# Patient Record
Sex: Male | Born: 1935
Health system: Southern US, Community
[De-identification: ages and names within clinical notes are randomized; demographics above are authoritative.]

## PROBLEM LIST (undated history)

## (undated) DIAGNOSIS — F329 Major depressive disorder, single episode, unspecified: Secondary | ICD-10-CM

## (undated) DIAGNOSIS — Z8546 Personal history of malignant neoplasm of prostate: Secondary | ICD-10-CM

## (undated) DIAGNOSIS — Z9861 Coronary angioplasty status: Principal | ICD-10-CM

## (undated) DIAGNOSIS — F32A Depression, unspecified: Secondary | ICD-10-CM

## (undated) DIAGNOSIS — T4145XA Adverse effect of unspecified anesthetic, initial encounter: Secondary | ICD-10-CM

## (undated) DIAGNOSIS — M1990 Unspecified osteoarthritis, unspecified site: Secondary | ICD-10-CM

## (undated) DIAGNOSIS — T8859XA Other complications of anesthesia, initial encounter: Secondary | ICD-10-CM

## (undated) DIAGNOSIS — E118 Type 2 diabetes mellitus with unspecified complications: Secondary | ICD-10-CM

## (undated) DIAGNOSIS — Z85820 Personal history of malignant melanoma of skin: Secondary | ICD-10-CM

## (undated) DIAGNOSIS — I480 Paroxysmal atrial fibrillation: Principal | ICD-10-CM

## (undated) DIAGNOSIS — N433 Hydrocele, unspecified: Secondary | ICD-10-CM

## (undated) DIAGNOSIS — R3915 Urgency of urination: Secondary | ICD-10-CM

## (undated) DIAGNOSIS — C61 Malignant neoplasm of prostate: Secondary | ICD-10-CM

## (undated) DIAGNOSIS — E785 Hyperlipidemia, unspecified: Secondary | ICD-10-CM

## (undated) DIAGNOSIS — R413 Other amnesia: Secondary | ICD-10-CM

## (undated) DIAGNOSIS — G4733 Obstructive sleep apnea (adult) (pediatric): Secondary | ICD-10-CM

## (undated) DIAGNOSIS — B029 Zoster without complications: Secondary | ICD-10-CM

## (undated) DIAGNOSIS — IMO0001 Reserved for inherently not codable concepts without codable children: Secondary | ICD-10-CM

## (undated) DIAGNOSIS — I5189 Other ill-defined heart diseases: Secondary | ICD-10-CM

## (undated) DIAGNOSIS — Z7901 Long term (current) use of anticoagulants: Secondary | ICD-10-CM

## (undated) DIAGNOSIS — C439 Malignant melanoma of skin, unspecified: Secondary | ICD-10-CM

## (undated) DIAGNOSIS — I4891 Unspecified atrial fibrillation: Secondary | ICD-10-CM

## (undated) DIAGNOSIS — Z9989 Dependence on other enabling machines and devices: Secondary | ICD-10-CM

## (undated) DIAGNOSIS — I499 Cardiac arrhythmia, unspecified: Secondary | ICD-10-CM

## (undated) DIAGNOSIS — R55 Syncope and collapse: Secondary | ICD-10-CM

## (undated) DIAGNOSIS — Z9889 Other specified postprocedural states: Secondary | ICD-10-CM

## (undated) DIAGNOSIS — I10 Essential (primary) hypertension: Secondary | ICD-10-CM

## (undated) DIAGNOSIS — K635 Polyp of colon: Secondary | ICD-10-CM

## (undated) DIAGNOSIS — H9190 Unspecified hearing loss, unspecified ear: Secondary | ICD-10-CM

## (undated) DIAGNOSIS — N189 Chronic kidney disease, unspecified: Secondary | ICD-10-CM

## (undated) DIAGNOSIS — Z8739 Personal history of other diseases of the musculoskeletal system and connective tissue: Secondary | ICD-10-CM

## (undated) DIAGNOSIS — I2510 Atherosclerotic heart disease of native coronary artery without angina pectoris: Principal | ICD-10-CM

## (undated) HISTORY — DX: Hyperlipidemia, unspecified: E78.5

## (undated) HISTORY — DX: Coronary angioplasty status: Z98.61

## (undated) HISTORY — DX: Malignant neoplasm of prostate: C61

## (undated) HISTORY — DX: Other amnesia: R41.3

## (undated) HISTORY — DX: Type 2 diabetes mellitus with unspecified complications: E11.8

## (undated) HISTORY — DX: Paroxysmal atrial fibrillation: I48.0

## (undated) HISTORY — PX: NM MYOVIEW LTD: HXRAD82

## (undated) HISTORY — DX: Other ill-defined heart diseases: I51.89

## (undated) HISTORY — DX: Unspecified atrial fibrillation: I48.91

## (undated) HISTORY — DX: Chronic kidney disease, unspecified: N18.9

## (undated) HISTORY — PX: TONSILLECTOMY: SUR1361

## (undated) HISTORY — DX: Polyp of colon: K63.5

## (undated) HISTORY — PX: EYE SURGERY: SHX253

## (undated) HISTORY — PX: OTHER SURGICAL HISTORY: SHX169

## (undated) HISTORY — PX: SKIN BIOPSY: SHX1

## (undated) HISTORY — DX: Malignant melanoma of skin, unspecified: C43.9

## (undated) HISTORY — DX: Atherosclerotic heart disease of native coronary artery without angina pectoris: I25.10

## (undated) HISTORY — DX: Essential (primary) hypertension: I10

## (undated) HISTORY — DX: Cardiac arrhythmia, unspecified: I49.9

## (undated) HISTORY — DX: Syncope and collapse: R55

---

## 2002-07-03 ENCOUNTER — Ambulatory Visit (HOSPITAL_COMMUNITY): Admission: RE | Admit: 2002-07-03 | Discharge: 2002-07-03 | Payer: Self-pay | Admitting: Urology

## 2002-07-03 ENCOUNTER — Encounter: Payer: Self-pay | Admitting: Urology

## 2002-07-22 ENCOUNTER — Ambulatory Visit: Admission: RE | Admit: 2002-07-22 | Discharge: 2002-10-20 | Payer: Self-pay | Admitting: Radiation Oncology

## 2002-10-24 ENCOUNTER — Ambulatory Visit: Admission: RE | Admit: 2002-10-24 | Discharge: 2003-01-09 | Payer: Self-pay | Admitting: Radiation Oncology

## 2002-11-15 ENCOUNTER — Encounter: Payer: Self-pay | Admitting: Urology

## 2002-11-15 ENCOUNTER — Ambulatory Visit (HOSPITAL_BASED_OUTPATIENT_CLINIC_OR_DEPARTMENT_OTHER): Admission: RE | Admit: 2002-11-15 | Discharge: 2002-11-15 | Payer: Self-pay | Admitting: Urology

## 2002-11-15 HISTORY — PX: OTHER SURGICAL HISTORY: SHX169

## 2003-02-06 ENCOUNTER — Emergency Department (HOSPITAL_COMMUNITY): Admission: EM | Admit: 2003-02-06 | Discharge: 2003-02-06 | Payer: Self-pay | Admitting: Emergency Medicine

## 2004-08-02 DIAGNOSIS — I2510 Atherosclerotic heart disease of native coronary artery without angina pectoris: Secondary | ICD-10-CM

## 2004-08-02 HISTORY — DX: Atherosclerotic heart disease of native coronary artery without angina pectoris: I25.10

## 2004-08-17 DIAGNOSIS — I2510 Atherosclerotic heart disease of native coronary artery without angina pectoris: Secondary | ICD-10-CM | POA: Insufficient documentation

## 2004-09-01 ENCOUNTER — Ambulatory Visit (HOSPITAL_COMMUNITY): Admission: RE | Admit: 2004-09-01 | Discharge: 2004-09-02 | Payer: Self-pay | Admitting: Cardiology

## 2004-09-01 HISTORY — PX: CORONARY ANGIOPLASTY WITH STENT PLACEMENT: SHX49

## 2005-04-04 DIAGNOSIS — B029 Zoster without complications: Secondary | ICD-10-CM

## 2005-04-04 HISTORY — DX: Zoster without complications: B02.9

## 2005-04-19 ENCOUNTER — Ambulatory Visit: Payer: Self-pay | Admitting: Internal Medicine

## 2005-04-27 ENCOUNTER — Ambulatory Visit: Payer: Self-pay | Admitting: Internal Medicine

## 2005-05-10 ENCOUNTER — Ambulatory Visit: Payer: Self-pay | Admitting: Internal Medicine

## 2005-05-10 ENCOUNTER — Encounter (INDEPENDENT_AMBULATORY_CARE_PROVIDER_SITE_OTHER): Payer: Self-pay | Admitting: Specialist

## 2005-05-10 DIAGNOSIS — K635 Polyp of colon: Secondary | ICD-10-CM

## 2005-05-10 HISTORY — DX: Polyp of colon: K63.5

## 2006-06-26 ENCOUNTER — Encounter: Admission: RE | Admit: 2006-06-26 | Discharge: 2006-06-26 | Payer: Self-pay | Admitting: Neurosurgery

## 2006-06-29 ENCOUNTER — Ambulatory Visit (HOSPITAL_COMMUNITY): Admission: RE | Admit: 2006-06-29 | Discharge: 2006-06-29 | Payer: Self-pay | Admitting: Urology

## 2006-06-30 ENCOUNTER — Emergency Department (HOSPITAL_COMMUNITY): Admission: EM | Admit: 2006-06-30 | Discharge: 2006-06-30 | Payer: Self-pay | Admitting: Emergency Medicine

## 2006-07-10 ENCOUNTER — Encounter: Admission: RE | Admit: 2006-07-10 | Discharge: 2006-07-10 | Payer: Self-pay | Admitting: Neurosurgery

## 2008-04-04 DIAGNOSIS — Z9889 Other specified postprocedural states: Secondary | ICD-10-CM

## 2008-04-04 DIAGNOSIS — Z85820 Personal history of malignant melanoma of skin: Secondary | ICD-10-CM

## 2008-04-04 HISTORY — DX: Other specified postprocedural states: Z85.820

## 2008-04-04 HISTORY — PX: OTHER SURGICAL HISTORY: SHX169

## 2008-04-04 HISTORY — DX: Other specified postprocedural states: Z98.890

## 2010-05-20 ENCOUNTER — Encounter: Payer: Self-pay | Admitting: Internal Medicine

## 2010-05-26 NOTE — Letter (Signed)
Summary: Colonoscopy Date Change Letter  [facility]  [place], [place] [address]   Phone: [phone]  Fax: [phone]      May 20, 2010 MRN: [id number]   [place], [place]  [address]   Dear [name],   Previously you were recommended to have a repeat colonoscopy around this time. Your chart was recently reviewed by Dr. Delfin Edis of [facility]. Follow up colonoscopy is now recommended in February 2017. This revised recommendation is based on current, nationally recognized guidelines for colorectal cancer screening and polyp surveillance. These guidelines are endorsed by the [place] Task Force on Colorectal Cancer as well as numerous other major medical organizations.  Please understand that our recommendation assumes that you do not have any new symptoms such as bleeding, a change in bowel habits, anemia, or significant abdominal discomfort. If you do have any concerning GI symptoms or want to discuss the guideline recommendations, please call to arrange an office visit at your earliest convenience. Otherwise we will keep you in our reminder system and contact you 1-2 months prior to the date listed above to schedule your next colonoscopy.  Thank you,  Lowella Bandy. Olevia Perches, M.D.  [facility] [phone]
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
## 2010-08-20 NOTE — Cardiovascular Report (Signed)
[name], [name]              ACCOUNT NO.:  [id number]   MEDICAL RECORD NO.:  [id number]          PATIENT TYPE:  OIB   LOCATION:  2899                         FACILITY:  [facility]   PHYSICIAN:  Bryson Dames, M.D.DATE OF BIRTH:  06-08-1935   DATE OF PROCEDURE:  09/01/2004  DATE OF DISCHARGE:                              CARDIAC CATHETERIZATION   PROCEDURES PERFORMED:  1. Left coronary angiography.  2. Percutaneous stenting of the distal and mid left circumflex coronary      arteries.  3. Predilatation of mid and distal left circumflex coronary arteries to      allow stent passage.  4. Percutaneous closure of the right femoral artery with AngioSeal.     COMPLICATIONS:  None.   ENTRY SITE:  Right femoral.   DYE USED:  Visipaque.   PREMEDICATIONS GIVEN:  Bicarbonate drip and Mucomyst.   PATIENT PROFILE:  The patient is a 75 year old gentleman with relatively  little or no symptoms who had which to have Viagra prescribed for him.  We  got a Cardiolite stress test and found to be abnormal and cardiac  catheterization was performed of the [facility] on or around  May 30 of this year and showed tight stenoses in two different areas of the  circumflex coronary artery mid and distal.  He was given oral Plavix for the  last several days and was brought in today on an outpatient basis electively  for this planned percutaneous intervention of the left circumflex coronary  artery.   Guide catheter used was a 6-French Cordis left XB catheter 3.5 tip. The  guide wire used was a Forte wire and a predilatation balloon consisted of a  15 mm x 2.0 Maverick balloon which was used to predilate all three areas of  stenosis in the two different areas of stenosis in the distal circumflex and  one lesion in the proximal circumflex. After doing so, the stent was then  able to pass the upper lesion with relative ease.  It had previously been  unable to traverse the tight stenosis  there. I deployed a 2.5 x 20 mm CYPHER  stent in the distal circumflex covering to lesions of 80% and reduced them  to 0% residual with inflations twice to 14 atmospheres of pressure with the  stent balloon   I was able to then overlap a second CYPHER stent 2.5 mm in diameter with 13  mm length overlapping it with a previous stent and then deploying it to 14  atmospheres. I then post dilated with a 2.75 Quantum Maverick balloon that  was 20 mm in length and I was able to balloon inflate over the overlap site  of the two stents.  I used 14 atmospheres of pressure for that purpose.   TIMI-3 flow was preserved in the entirety of the vessel both before and  after intervention.  The lesions seen in the various areas of this vessel  were as follows:  Mid circumflex stenosis 99% reduced to 0% residual and the  two lesions of 80% in the distal circumflex were also reduced to 0%  residual. TIMI-3 flow was preserved.   During the procedure, ACT registered approximately 312 seconds and only  Angiomax was used.  It was stopped immediately after the procedure.   We then did a right femoral angiogram with a hand injection to ensure that  our location was favorable for AngioSeal.  That was performed with relative  ease with excellent hemostasis. No complications occurred.   FINAL DIAGNOSES:  1. Multiple lesions in mid and left circumflex coronary artery by cardiac      cath within the past week.  2. Successful percutaneous stenting of mid and distal circumflex with      reduction of greater than 75% lesions in three locations to 0%      residual.     PLAN:  The patient will remain on Plavix and aspirin for year. He will be  discharged within 23 hours and his AngioSeal will allow him to ambulate in  one hour.      WHG/MEDQ  D:  09/01/2004  T:  09/01/2004  Job:  [id number]   cc:   Shane Crutch. Gaynelle Arabian, M.D.  [place]. [address] [place]  [place] [address]  Fax: [facility]

## 2010-08-20 NOTE — Op Note (Signed)
NAME:  [name], [name]                        ACCOUNT NO.:  [id number]   MEDICAL RECORD NO.:  [id number]                   PATIENT TYPE:  AMB   LOCATION:  NESC                                 FACILITY:  [facility]   PHYSICIAN:  Sigmund I. Gaynelle Arabian, M.D.         DATE OF BIRTH:  10-14-1935   DATE OF PROCEDURE:  11/15/2002  DATE OF DISCHARGE:                                 OPERATIVE REPORT   PREOPERATIVE DIAGNOSIS:  Adenocarcinoma of the prostate.   POSTOPERATIVE DIAGNOSIS:  Adenocarcinoma of the prostate.   OPERATION:  Implantation of palladium gold seed for prostate cancer (118  seeds total).   SURGEON:  Sigmund I. Gaynelle Arabian, M.D.   RADIATION THERAPIST:  Truddie Crumble, M.D.   PREPARATION:  After appropriate preanesthesia, the patient is brought to the  operating room and placed on the operating table in the dorsal supine  position where general LMA anesthesia was introduced.  He was then re-placed  in the dorsal lithotomy position, where the pubis was prepped with Betadine  solution and draped in the usual fashion.   REVIEW OF HISTORY:  This 75 year old, married, white male, has T1c  adenocarcinoma of the prostate for palladium implantation.  He has a 54 mL  gland, Gleason 6, with a PSA of 4.2.  He has an IODINE allergy,  necessitating palladium implant.  The patient has a positive family history  with both father and brother with adenocarcinoma of the prostate.  Bone scan  shows DJD, and CT scan is negative.  Prostate symptom score sheet was 23/7,  dropping to 11/7 on Flomax.  He has normal erections, and normal bowels.   PAST HISTORY:  1. Hypertension.  2. Gout.   MEDICATIONS:  Lotrel, allopurinol, Celebrex, aspirin, gemfibrozil, Flomax.   The patient has known left hydrocele, and will be returning in the future  for a hydrocelectomy.   DESCRIPTION OF PROCEDURE:  With the patient in dorsal lithotomy position,  ultrasound is accomplished of the prostate, and the  patient undergoes  implantation of palladium seeds (118 seeds).  Note that the patient had  preparation with Hibiclens because of his iodine allergy.  He is given a B&O  suppository at the beginning of the case, and IV Toradol at the end of the  case.  He tolerated the procedure well.  Cystoscopy shows one small clot  within the bladder, and no evidence of any seeds within the bladder.  He was  felt to have an excellent implant.  The patient tolerated the procedure  well; Foley catheter was left in place.  The patient was awakened and taken  to recovery room in excellent condition.                                               Sigmund I. Gaynelle Arabian, M.D.  SIT/MEDQ  D:  11/15/2002  T:  11/15/2002  Job:  [id number]   cc:   Truddie Crumble, M.D.  [address]  [address]  Fax: [phone]

## 2011-04-25 DIAGNOSIS — I10 Essential (primary) hypertension: Secondary | ICD-10-CM | POA: Diagnosis not present

## 2011-04-25 DIAGNOSIS — I701 Atherosclerosis of renal artery: Secondary | ICD-10-CM | POA: Diagnosis not present

## 2011-05-18 DIAGNOSIS — E782 Mixed hyperlipidemia: Secondary | ICD-10-CM | POA: Diagnosis not present

## 2011-05-18 DIAGNOSIS — D239 Other benign neoplasm of skin, unspecified: Secondary | ICD-10-CM | POA: Diagnosis not present

## 2011-05-18 DIAGNOSIS — L738 Other specified follicular disorders: Secondary | ICD-10-CM | POA: Diagnosis not present

## 2011-05-18 DIAGNOSIS — I2510 Atherosclerotic heart disease of native coronary artery without angina pectoris: Secondary | ICD-10-CM | POA: Diagnosis not present

## 2011-05-18 DIAGNOSIS — L821 Other seborrheic keratosis: Secondary | ICD-10-CM | POA: Diagnosis not present

## 2011-05-18 DIAGNOSIS — Z85820 Personal history of malignant melanoma of skin: Secondary | ICD-10-CM | POA: Diagnosis not present

## 2011-05-18 DIAGNOSIS — I10 Essential (primary) hypertension: Secondary | ICD-10-CM | POA: Diagnosis not present

## 2011-06-10 DIAGNOSIS — Z95818 Presence of other cardiac implants and grafts: Secondary | ICD-10-CM | POA: Diagnosis not present

## 2011-06-10 DIAGNOSIS — I2510 Atherosclerotic heart disease of native coronary artery without angina pectoris: Secondary | ICD-10-CM | POA: Diagnosis not present

## 2011-06-10 DIAGNOSIS — E782 Mixed hyperlipidemia: Secondary | ICD-10-CM | POA: Diagnosis not present

## 2011-08-16 DIAGNOSIS — I10 Essential (primary) hypertension: Secondary | ICD-10-CM | POA: Diagnosis not present

## 2011-08-16 DIAGNOSIS — E782 Mixed hyperlipidemia: Secondary | ICD-10-CM | POA: Diagnosis not present

## 2011-08-16 DIAGNOSIS — I2510 Atherosclerotic heart disease of native coronary artery without angina pectoris: Secondary | ICD-10-CM | POA: Diagnosis not present

## 2011-08-16 DIAGNOSIS — Z9861 Coronary angioplasty status: Secondary | ICD-10-CM | POA: Diagnosis not present

## 2011-09-15 DIAGNOSIS — E782 Mixed hyperlipidemia: Secondary | ICD-10-CM | POA: Diagnosis not present

## 2011-09-15 DIAGNOSIS — Z79899 Other long term (current) drug therapy: Secondary | ICD-10-CM | POA: Diagnosis not present

## 2011-10-03 DIAGNOSIS — C61 Malignant neoplasm of prostate: Secondary | ICD-10-CM | POA: Diagnosis not present

## 2011-10-03 DIAGNOSIS — N433 Hydrocele, unspecified: Secondary | ICD-10-CM | POA: Diagnosis not present

## 2011-10-04 ENCOUNTER — Other Ambulatory Visit: Payer: Self-pay | Admitting: Urology

## 2011-10-07 ENCOUNTER — Encounter (HOSPITAL_BASED_OUTPATIENT_CLINIC_OR_DEPARTMENT_OTHER): Payer: Self-pay | Admitting: *Deleted

## 2011-10-07 NOTE — Progress Notes (Signed)
NPO AFTER MN. ARRIVES AT 0845. NEEDS ISTAT. CURRENT EKG, LAST OFFICE NOTE, AND STRESS TEST TO BE FAXED FROM DR HILTY ([place], [phone]).

## 2011-10-14 ENCOUNTER — Ambulatory Visit (HOSPITAL_BASED_OUTPATIENT_CLINIC_OR_DEPARTMENT_OTHER)
Admission: RE | Admit: 2011-10-14 | Discharge: 2011-10-14 | Disposition: A | Discharge status: Home / Self Care | Payer: Medicare Other | Source: Ambulatory Visit | Attending: Urology | Admitting: Urology

## 2011-10-14 ENCOUNTER — Encounter (HOSPITAL_BASED_OUTPATIENT_CLINIC_OR_DEPARTMENT_OTHER): Payer: Self-pay | Admitting: Anesthesiology

## 2011-10-14 ENCOUNTER — Ambulatory Visit (HOSPITAL_BASED_OUTPATIENT_CLINIC_OR_DEPARTMENT_OTHER): Payer: Medicare Other | Admitting: Anesthesiology

## 2011-10-14 ENCOUNTER — Encounter (HOSPITAL_BASED_OUTPATIENT_CLINIC_OR_DEPARTMENT_OTHER)
Admission: RE | Disposition: A | Discharge status: Home / Self Care | Payer: Self-pay | Source: Ambulatory Visit | Attending: Urology

## 2011-10-14 ENCOUNTER — Encounter (HOSPITAL_BASED_OUTPATIENT_CLINIC_OR_DEPARTMENT_OTHER): Payer: Self-pay | Admitting: *Deleted

## 2011-10-14 DIAGNOSIS — G4730 Sleep apnea, unspecified: Secondary | ICD-10-CM | POA: Diagnosis not present

## 2011-10-14 DIAGNOSIS — Z7982 Long term (current) use of aspirin: Secondary | ICD-10-CM | POA: Diagnosis not present

## 2011-10-14 DIAGNOSIS — N453 Epididymo-orchitis: Secondary | ICD-10-CM | POA: Diagnosis not present

## 2011-10-14 DIAGNOSIS — N5089 Other specified disorders of the male genital organs: Secondary | ICD-10-CM | POA: Insufficient documentation

## 2011-10-14 DIAGNOSIS — Z7902 Long term (current) use of antithrombotics/antiplatelets: Secondary | ICD-10-CM | POA: Insufficient documentation

## 2011-10-14 DIAGNOSIS — N508 Other specified disorders of male genital organs: Secondary | ICD-10-CM | POA: Diagnosis not present

## 2011-10-14 DIAGNOSIS — I10 Essential (primary) hypertension: Secondary | ICD-10-CM | POA: Insufficient documentation

## 2011-10-14 DIAGNOSIS — C61 Malignant neoplasm of prostate: Secondary | ICD-10-CM | POA: Insufficient documentation

## 2011-10-14 DIAGNOSIS — N433 Hydrocele, unspecified: Secondary | ICD-10-CM | POA: Insufficient documentation

## 2011-10-14 DIAGNOSIS — Z79899 Other long term (current) drug therapy: Secondary | ICD-10-CM | POA: Diagnosis not present

## 2011-10-14 DIAGNOSIS — N529 Male erectile dysfunction, unspecified: Secondary | ICD-10-CM | POA: Diagnosis not present

## 2011-10-14 HISTORY — DX: Urgency of urination: R39.15

## 2011-10-14 HISTORY — DX: Hydrocele, unspecified: N43.3

## 2011-10-14 HISTORY — DX: Personal history of malignant neoplasm of prostate: Z85.46

## 2011-10-14 HISTORY — DX: Reserved for inherently not codable concepts without codable children: IMO0001

## 2011-10-14 HISTORY — DX: Other specified postprocedural states: Z98.890

## 2011-10-14 HISTORY — DX: Obstructive sleep apnea (adult) (pediatric): Z99.89

## 2011-10-14 HISTORY — DX: Essential (primary) hypertension: I10

## 2011-10-14 HISTORY — DX: Unspecified hearing loss, unspecified ear: H91.90

## 2011-10-14 HISTORY — PX: HYDROCELE EXCISION: SHX482

## 2011-10-14 HISTORY — DX: Obstructive sleep apnea (adult) (pediatric): G47.33

## 2011-10-14 HISTORY — DX: Personal history of other diseases of the musculoskeletal system and connective tissue: Z87.39

## 2011-10-14 HISTORY — DX: Personal history of malignant melanoma of skin: Z85.820

## 2011-10-14 LAB — POCT I-STAT 4, (NA,K, GLUC, HGB,HCT)
Glucose, Bld: 123 mg/dL — ABNORMAL HIGH (ref 70–99)
HCT: 42 % (ref 39.0–52.0)
Hemoglobin: 14.3 g/dL (ref 13.0–17.0)
Potassium: 3.8 mEq/L (ref 3.5–5.1)
Sodium: 142 mEq/L (ref 135–145)

## 2011-10-14 SURGERY — HYDROCELECTOMY
Anesthesia: General | Site: Scrotum | Laterality: Left | Wound class: Clean Contaminated

## 2011-10-14 MED ORDER — DEXAMETHASONE SODIUM PHOSPHATE 4 MG/ML IJ SOLN
INTRAMUSCULAR | Status: DC | PRN
Start: 2011-10-14 — End: 2011-10-14
  Administered 2011-10-14: 10 mg via INTRAVENOUS

## 2011-10-14 MED ORDER — CEFAZOLIN SODIUM-DEXTROSE 2-3 GM-% IV SOLR: 2.0000 g | INTRAVENOUS | Status: DC

## 2011-10-14 MED ORDER — DOXYCYCLINE HYCLATE 50 MG PO CAPS: 100.0000 mg | ORAL_CAPSULE | Freq: Two times a day (BID) | ORAL | Status: AC

## 2011-10-14 MED ORDER — LACTATED RINGERS IV SOLN: INTRAVENOUS | Status: DC

## 2011-10-14 MED ORDER — MIDAZOLAM HCL 5 MG/5ML IJ SOLN
INTRAMUSCULAR | Status: DC | PRN
  Administered 2011-10-14: 2 mg via INTRAVENOUS

## 2011-10-14 MED ORDER — GLYCOPYRROLATE 0.2 MG/ML IJ SOLN
INTRAMUSCULAR | Status: DC | PRN
  Administered 2011-10-14: 0.2 mg via INTRAVENOUS

## 2011-10-14 MED ORDER — FENTANYL CITRATE 0.05 MG/ML IJ SOLN: 25.0000 ug | INTRAMUSCULAR | Status: DC | PRN

## 2011-10-14 MED ORDER — KETOROLAC TROMETHAMINE 30 MG/ML IJ SOLN
INTRAMUSCULAR | Status: DC | PRN
Start: 2011-10-14 — End: 2011-10-14
  Administered 2011-10-14: 30 mg via INTRAVENOUS

## 2011-10-14 MED ORDER — LIDOCAINE HCL (CARDIAC) 20 MG/ML IV SOLN
INTRAVENOUS | Status: DC | PRN
  Administered 2011-10-14: 100 mg via INTRAVENOUS

## 2011-10-14 MED ORDER — FENTANYL CITRATE 0.05 MG/ML IJ SOLN
INTRAMUSCULAR | Status: DC | PRN
Start: 2011-10-14 — End: 2011-10-14
  Administered 2011-10-14: 100 ug via INTRAVENOUS
  Administered 2011-10-14: 50 ug via INTRAVENOUS
  Administered 2011-10-14 (×2): 25 ug via INTRAVENOUS

## 2011-10-14 MED ORDER — ONDANSETRON HCL 4 MG/2ML IJ SOLN
INTRAMUSCULAR | Status: DC | PRN
  Administered 2011-10-14: 4 mg via INTRAVENOUS

## 2011-10-14 MED ORDER — TAPENTADOL HCL 100 MG PO TABS: 100.0000 mg | ORAL_TABLET | Freq: Four times a day (QID) | ORAL | Status: DC | PRN

## 2011-10-14 MED ORDER — BUPIVACAINE LIPOSOME 1.3 % IJ SUSP
20.0000 mL | Freq: Once | INTRAMUSCULAR | Status: AC
  Administered 2011-10-14: 20 mL
  Filled 2011-10-14: qty 20

## 2011-10-14 MED ORDER — CEFAZOLIN SODIUM 1-5 GM-% IV SOLN: 1.0000 g | INTRAVENOUS | Status: DC

## 2011-10-14 MED ORDER — LACTATED RINGERS IV SOLN
INTRAVENOUS | Status: DC | PRN
  Administered 2011-10-14 (×2): via INTRAVENOUS

## 2011-10-14 MED ORDER — SODIUM CHLORIDE 0.9 % IR SOLN
Status: DC | PRN
  Administered 2011-10-14: 500 mL

## 2011-10-14 MED ORDER — PROPOFOL 10 MG/ML IV EMUL
INTRAVENOUS | Status: DC | PRN
  Administered 2011-10-14: 100 mg via INTRAVENOUS
  Administered 2011-10-14: 200 mg via INTRAVENOUS

## 2011-10-14 MED ORDER — CEFAZOLIN SODIUM 1-5 GM-% IV SOLN
INTRAVENOUS | Status: DC | PRN
  Administered 2011-10-14: 2 g via INTRAVENOUS

## 2011-10-14 MED ORDER — LACTATED RINGERS IV SOLN
INTRAVENOUS | Status: DC
  Administered 2011-10-14 (×2): 100 mL/h via INTRAVENOUS

## 2011-10-14 SURGICAL SUPPLY — 45 items
ADH SKN CLS APL DERMABOND .7 (GAUZE/BANDAGES/DRESSINGS) ×2
BANDAGE GAUZE ELAST BULKY 4 IN (GAUZE/BANDAGES/DRESSINGS) ×8 IMPLANT
BLADE SURG 15 STRL LF DISP TIS (BLADE) ×1 IMPLANT
BLADE SURG 15 STRL SS (BLADE) ×2
BLADE SURG ROTATE 9660 (MISCELLANEOUS) ×2 IMPLANT
BRIEF STRETCH FOR OB PAD LRG (UNDERPADS AND DIAPERS) ×4 IMPLANT
CANISTER SUCTION 1200CC (MISCELLANEOUS) IMPLANT
CANISTER SUCTION 2500CC (MISCELLANEOUS) ×1 IMPLANT
CLOTH BEACON ORANGE TIMEOUT ST (SAFETY) ×2 IMPLANT
COVER MAYO STAND STRL (DRAPES) ×2 IMPLANT
COVER TABLE BACK 60X90 (DRAPES) ×2 IMPLANT
DERMABOND ADVANCED (GAUZE/BANDAGES/DRESSINGS) ×2
DERMABOND ADVANCED .7 DNX12 (GAUZE/BANDAGES/DRESSINGS) ×1 IMPLANT
DISSECTOR ROUND CHERRY 3/8 STR (MISCELLANEOUS) IMPLANT
DRAIN PENROSE 18X1/2 LTX STRL (DRAIN) ×1 IMPLANT
DRAIN PENROSE 18X1/4 LTX STRL (WOUND CARE) IMPLANT
DRAPE PED LAPAROTOMY (DRAPES) ×2 IMPLANT
ELECT NDL TIP 2.8 STRL (NEEDLE) ×1 IMPLANT
ELECT NEEDLE TIP 2.8 STRL (NEEDLE) ×2 IMPLANT
ELECT REM PT RETURN 9FT ADLT (ELECTROSURGICAL) ×2
ELECTRODE REM PT RTRN 9FT ADLT (ELECTROSURGICAL) ×1 IMPLANT
GAUZE SPONGE 4X4 16PLY XRAY LF (GAUZE/BANDAGES/DRESSINGS) ×1 IMPLANT
GLOVE BIO SURGEON STRL SZ7.5 (GLOVE) ×2 IMPLANT
GLOVE SKINSENSE NS SZ6.5 (GLOVE) ×2
GLOVE SKINSENSE STRL SZ6.5 (GLOVE) IMPLANT
GOWN PREVENTION PLUS LG XLONG (DISPOSABLE) ×3 IMPLANT
NDL HYPO 25X1 1.5 SAFETY (NEEDLE) IMPLANT
NDL SAFETY ECLIPSE 18X1.5 (NEEDLE) IMPLANT
NEEDLE HYPO 18GX1.5 SHARP (NEEDLE) ×2
NEEDLE HYPO 25X1 1.5 SAFETY (NEEDLE) ×2 IMPLANT
NS IRRIG 500ML POUR BTL (IV SOLUTION) ×3 IMPLANT
PACK BASIN DAY SURGERY FS (CUSTOM PROCEDURE TRAY) ×2 IMPLANT
PAD PREP 24X48 CUFFED NSTRL (MISCELLANEOUS) ×2 IMPLANT
PENCIL BUTTON HOLSTER BLD 10FT (ELECTRODE) ×2 IMPLANT
SUT VIC AB 3-0 SH 27 (SUTURE) ×14
SUT VIC AB 3-0 SH 27X BRD (SUTURE) ×4 IMPLANT
SUT VIC AB 3-0 X1 27 (SUTURE) IMPLANT
SUT VICRYL 4-0 PS2 18IN ABS (SUTURE) ×1 IMPLANT
SYR 20CC LL (SYRINGE) ×1 IMPLANT
SYR 50ML LL SCALE MARK (SYRINGE) IMPLANT
SYR CONTROL 10ML LL (SYRINGE) IMPLANT
TRAY DSU PREP LF (CUSTOM PROCEDURE TRAY) ×2 IMPLANT
TUBE CONNECTING 12X1/4 (SUCTIONS) ×2 IMPLANT
WATER STERILE IRR 500ML POUR (IV SOLUTION) ×1 IMPLANT
YANKAUER SUCT BULB TIP NO VENT (SUCTIONS) ×2 IMPLANT

## 2011-10-14 NOTE — Op Note (Signed)
Pre-operative diagnosis : Left hydrocele  Postoperative diagnosis: Left hydrocele and hematocele with chronic left epididymitis  Operation: Left scrotal exploration, evacuation of left hematocele and hydrocele, hydrocelectomy, frozen section biopsy of left testicle, insertion of drain in left dependent portion of scrotum.  Surgeon:  Chauncey Cruel. Gaynelle Arabian, MD  First assistant: None  Anesthesia:  general  Preparation: After appropriate premedication, the patient was brought to the operating room, placed on the operating table in the dorsal supine position. The left inner thigh was previously marked. The arm band was rechecked. The patient underwent general LMA anesthesia. The left hemiscrotum was then shaven, and prepped with antibiotic solution (allergy to iodine), and draped in usual fashion.  Review history: The patient is a 76 year old male, with a history of prostate cancer post I 9 seed implantation in August of 2004, with chronic anxiety and depression, and enlarging left hydrocele. The patient's known left hydrocele has recently begun to enlarge, and is causing some difficulty with walking. He would now like to have outpatient hydrocelectomy.  Statement of  Likelihood of Success: Excellent. TIME-OUT observed.:  Procedure: Using a marking pen, a 6 cm left hemiscrotal incision is outlined, and incision is made along the skin fold. Subcutaneous tissue is dissected with elected surgical unit. The dartos and cremaster fibers were separated. A tense fluid-filled tunica vaginalis is dissected with sharp and blunt dissection. Bleeding is electrocoagulated. The tunica vaginalis is entered, and 100 cc of clear and bloody fluid is evacuated. The wings of the tunica vaginalis R. incised, and the wings are then oversewn with 3-0 Vicryl suture. The wings were then sutured behind the testicle in order to prevent recurrent hydrocele formation. The epididymal head and testicle appeared abnormal, although soft enough  that it did not appear to be cancerous. However, because of the unusual nature of the hematocele, I elected to perform frozen section analysis. Wedge biopsy was taken, and frozen section analysis per Dr. Saralyn Pilar showed chronic inflammation. Lymphoid follicles were identified. The wound was closed with running 3-0 Vicryl suture.  X. Parillo was then injected into the wound edges, as well as into the spermatic cord. The wound was copiously irrigated with saline. A 1 cm wide Penrose drain was placed in the deep ended portion of the scrotum, and X. Parillo was injected into the drain edges. The drain was sutured in place with 3-0 Vicryl suture. The internal portion a drain was split, with half a drain in front of the testicle and have to drain behind the testicle. The testicle was replaced into the left hemiscrotum. The wounds closed in 3 layers, with 3-0 Vicryl, and 5-0 Vicryl. The skin was sealed with Dermabond. Mesh pants and Kerlix were placed. The patient was given IV Toradol, awakened and taken to recovery room in good condition. He was given 2 g of IV Ancef prior to the procedure.

## 2011-10-14 NOTE — Transfer of Care (Signed)
Immediate Anesthesia Transfer of Care Note  Patient: [name]  Procedure(s) Performed: Procedure(s) (LRB): HYDROCELECTOMY ADULT (Left)  Patient Location: PACU  Anesthesia Type: General  Level of Consciousness: awake, sedated, patient cooperative and responds to stimulation  Airway & Oxygen Therapy: Patient Spontanous Breathing and Patient connected to face mask oxygen  Post-op Assessment: Report given to PACU RN, Post -op Vital signs reviewed and stable and Patient moving all extremities  Post vital signs: Reviewed and stable  Complications: No apparent anesthesia complications

## 2011-10-14 NOTE — H&P (Signed)
Active Problems Problems  1. Depression 311 2. Hydrocele Left 603.9 3. Male Erectile Disorder Due To Physical Condition 607.84 4. Prostate Cancer 185  History of Present Illness              [name] is a 76 yo male returns today because of Lt testicular pain & increased swelling with a hx of Lt hydrocele.  He also has a  history of adenocarcinoma of the prostate (s/p I-125 seeds in Aug. 2004), anxiety, and depression for which he takes Lexapro.  He has incomplete emptying, urgency, frequency, intermittant, & weak flow.  Today's IPSS = 12/7.  08/21/09  PSA - 0.05   Past Medical History Problems  1. History of  Adult Sleep Apnea 780.57 2. History of  Depression 311 3. History of  Functional Murmur 785.2 4. History of  Gout 274.9 5. History of  Heartburn 787.1 6. History of  Hypertension 401.9 7. History of  Malignant Melanoma Of The Skin V10.82  Surgical History Problems  1. History of  Heart Surgery 2. History of  Mohs Micrographic Surgery Forehead Left Side 3. History of  Prostate Surgery  Current Meds 1. Adult Aspirin Low Strength 81 MG Oral Tablet Dispersible; Therapy: (Recorded:25Jan2008) to 2. Allopurinol 300 MG Oral Tablet; Therapy: 22Sep2007 to 3. AmLODIPine Besylate 10 MG Oral Tablet; Therapy: 27Sep2007 to 4. Crestor 20 MG Oral Tablet; Therapy: [phone] to 5. Diovan 80 MG Oral Tablet; Therapy: 27Sep2007 to 6. Lexapro 10 MG Oral Tablet; TAKE 1 TABLET DAILY; Therapy: [id number] to (Evaluate:14May2012);  Last Rx:20May2011 7. Oxybutynin Chloride ER 15 MG Oral Tablet Extended Release 24 Hour; TAKE 15 MG Daily;  Therapy: 19Aug2009 to (Evaluate:14May2012); Last Rx:20May2011 8. Plavix 75 MG Oral Tablet; Therapy: 27Sep2007 to 9. Tamsulosin HCl 0.4 MG CP24; TAKE 1 CAPSULE DAILY; Therapy: 03Oct2007 to  (Evaluate:14May2012); Last Rx:20May2011 10. Tamsulosin HCl 0.4 MG Oral Capsule; TAKE 1 CAPSULE Daily; Therapy: [id number] to   (Evaluate:14May2012); Last  [id number]  Allergies Medication  1. Iodine SOLN  Family History Problems  1. Family history of  Family Health Status Number Of Children 1 son; 1 daughter 2. Paternal history of  Prostate Cancer V16.42  Social History Problems  1. Family history of  Death In The Family Father 72, prostate cancer 2. Marital History - Currently Married 3. Never A Smoker 4. Occupation: Hotel manager  Review of Systems Genitourinary, constitutional, skin, eye, otolaryngeal, hematologic/lymphatic, cardiovascular, pulmonary, endocrine, musculoskeletal, gastrointestinal, neurological and psychiatric system(s) were reviewed and pertinent findings if present are noted.  Genitourinary: urinary frequency, feelings of urinary urgency, weak urinary stream, urinary stream starts and stops, incomplete emptying of bladder, testicular pain and scrotal swelling.    Vitals Vital Signs [Data Includes: Last 1 Day]  [id number] 03:58PM  BMI Calculated: 30.92 BSA Calculated: 2.16 Height: 5 ft 10 in Weight: 216 lb  Blood Pressure: 156 / 75 Heart Rate: 72  Physical Exam Genitourinary: Examination of the penis demonstrates no discharge, no masses, no lesions and a normal meatus. The penis is circumcised. The scrotum is without lesions. The right vas deferens is not able to be palpated. The right epididymis is palpably normal, non-tender and without masses. The left epididymis is found to have a mass, but palpably normal and non-tender. The right testis is palpably normal, non-tender and without masses. The left testis is nonpalpable, non-tender and without masses.    Results/Data Urine [Data Includes: Last 1 Day]   [id number]  COLOR YELLOW   APPEARANCE CLEAR   SPECIFIC GRAVITY 1.030  pH 6.0   GLUCOSE NEG mg/dL  BILIRUBIN NEG   KETONE NEG mg/dL  BLOOD NEG   PROTEIN 30 mg/dL  UROBILINOGEN 0.2 mg/dL  NITRITE NEG   LEUKOCYTE ESTERASE NEG   SQUAMOUS EPITHELIAL/HPF RARE   WBC 0-2 WBC/hpf  RBC 0-2 RBC/hpf  BACTERIA  RARE   CRYSTALS NONE SEEN   CASTS NONE SEEN   Other MUCUS PRESENT    Assessment Assessed  1. Hydrocele Left 603.9 2. Prostate Cancer 185   L hydrocele   Plan Health Maintenance (V70.0)  1. UA With REFLEX  Done: [id number] 03:31PM   Schedule L hydrocelectomy.   Signatures Electronically signed by : Carolan Clines, M.D.; Oct 03 2011  4:46PM

## 2011-10-14 NOTE — Anesthesia Procedure Notes (Signed)
Procedure Name: LMA Insertion Date/Time: 10/14/2011 10:24 AM Performed by: Justice Rocher Pre-anesthesia Checklist: Patient identified, Emergency Drugs available, Suction available and Patient being monitored Patient Re-evaluated:Patient Re-evaluated prior to inductionOxygen Delivery Method: Circle System Utilized Preoxygenation: Pre-oxygenation with 100% oxygen Intubation Type: IV induction Ventilation: Mask ventilation without difficulty LMA: LMA inserted LMA Size: 4.0 Number of attempts: 1 Airway Equipment and Method: bite block Placement Confirmation: positive ETCO2 Tube secured with: Tape Dental Injury: Teeth and Oropharynx as per pre-operative assessment

## 2011-10-14 NOTE — Interval H&P Note (Signed)
History and Physical Interval Note:  10/14/2011 10:23 AM  [name]  has presented today for surgery, with the diagnosis of HYDROCELE LEFT  The various methods of treatment have been discussed with the patient and family. After consideration of risks, benefits and other options for treatment, the patient has consented to  Procedure(s) (LRB): HYDROCELECTOMY ADULT (Left) as a surgical intervention .  The patient's history has been reviewed, patient examined, no change in status, stable for surgery.  I have reviewed the patients' chart and labs.  Questions were answered to the patient's satisfaction.     Carolan Clines I

## 2011-10-14 NOTE — Anesthesia Preprocedure Evaluation (Addendum)
Anesthesia Evaluation  Patient identified by MRN, date of birth, ID band Patient awake    Reviewed: Allergy & Precautions, H&P , NPO status , Patient's Chart, lab work & pertinent test results  History of Anesthesia Complications (+) PONV  Airway Mallampati: III TM Distance: >3 FB Neck ROM: full    Dental No notable dental hx. (+) Teeth Intact and Dental Advisory Given   Pulmonary neg pulmonary ROS, sleep apnea and Continuous Positive Airway Pressure Ventilation ,  breath sounds clear to auscultation  Pulmonary exam normal       Cardiovascular Exercise Tolerance: Good hypertension, Pt. on medications + CAD and + Cardiac Stents negative cardio ROS  Rhythm:regular Rate:Normal  2006 2 stents.  OK since.  Normal cardiolite 3/13.   Neuro/Psych negative neurological ROS  negative psych ROS   GI/Hepatic negative GI ROS, Neg liver ROS,   Endo/Other  negative endocrine ROS  Renal/GU negative Renal ROS  negative genitourinary   Musculoskeletal   Abdominal   Peds  Hematology negative hematology ROS (+)   Anesthesia Other Findings   Reproductive/Obstetrics negative OB ROS                         Anesthesia Physical Anesthesia Plan  ASA: III  Anesthesia Plan: General   Post-op Pain Management:    Induction: Intravenous  Airway Management Planned: LMA  Additional Equipment:   Intra-op Plan:   Post-operative Plan:   Informed Consent: I have reviewed the patients History and Physical, chart, labs and discussed the procedure including the risks, benefits and alternatives for the proposed anesthesia with the patient or authorized representative who has indicated his/her understanding and acceptance.   Dental Advisory Given  Plan Discussed with: Surgeon  Anesthesia Plan Comments:         Anesthesia Quick Evaluation

## 2011-10-17 NOTE — Anesthesia Postprocedure Evaluation (Signed)
Anesthesia Post Note  Patient: [name]  Procedure(s) Performed: Procedure(s) (LRB): HYDROCELECTOMY ADULT (Left)  Anesthesia type: General  Patient location: PACU  Post pain: Pain level controlled  Post assessment: Post-op Vital signs reviewed  Last Vitals:  Filed Vitals:   10/14/11 1622  BP: 148/77  Pulse: 63  Temp: 36 C  Resp: 20    Post vital signs: Reviewed  Level of consciousness: sedated  Complications: No apparent anesthesia complications

## 2011-10-18 ENCOUNTER — Encounter (HOSPITAL_BASED_OUTPATIENT_CLINIC_OR_DEPARTMENT_OTHER): Payer: Self-pay | Admitting: Urology

## 2011-10-21 DIAGNOSIS — N433 Hydrocele, unspecified: Secondary | ICD-10-CM | POA: Diagnosis not present

## 2011-12-02 DIAGNOSIS — N433 Hydrocele, unspecified: Secondary | ICD-10-CM | POA: Diagnosis not present

## 2011-12-02 DIAGNOSIS — C61 Malignant neoplasm of prostate: Secondary | ICD-10-CM | POA: Diagnosis not present

## 2011-12-16 DIAGNOSIS — K5289 Other specified noninfective gastroenteritis and colitis: Secondary | ICD-10-CM | POA: Diagnosis not present

## 2012-01-06 DIAGNOSIS — Z23 Encounter for immunization: Secondary | ICD-10-CM | POA: Diagnosis not present

## 2012-01-09 DIAGNOSIS — G609 Hereditary and idiopathic neuropathy, unspecified: Secondary | ICD-10-CM | POA: Diagnosis not present

## 2012-01-09 DIAGNOSIS — E785 Hyperlipidemia, unspecified: Secondary | ICD-10-CM | POA: Diagnosis not present

## 2012-01-09 DIAGNOSIS — I10 Essential (primary) hypertension: Secondary | ICD-10-CM | POA: Diagnosis not present

## 2012-01-09 DIAGNOSIS — E663 Overweight: Secondary | ICD-10-CM | POA: Diagnosis not present

## 2012-01-09 DIAGNOSIS — E119 Type 2 diabetes mellitus without complications: Secondary | ICD-10-CM | POA: Diagnosis not present

## 2012-01-09 DIAGNOSIS — M549 Dorsalgia, unspecified: Secondary | ICD-10-CM | POA: Diagnosis not present

## 2012-01-09 DIAGNOSIS — I2510 Atherosclerotic heart disease of native coronary artery without angina pectoris: Secondary | ICD-10-CM | POA: Diagnosis not present

## 2012-01-10 DIAGNOSIS — E782 Mixed hyperlipidemia: Secondary | ICD-10-CM | POA: Diagnosis not present

## 2012-01-10 DIAGNOSIS — Z79899 Other long term (current) drug therapy: Secondary | ICD-10-CM | POA: Diagnosis not present

## 2012-03-08 DIAGNOSIS — R071 Chest pain on breathing: Secondary | ICD-10-CM | POA: Diagnosis not present

## 2012-03-08 DIAGNOSIS — J069 Acute upper respiratory infection, unspecified: Secondary | ICD-10-CM | POA: Diagnosis not present

## 2012-03-12 ENCOUNTER — Encounter (HOSPITAL_COMMUNITY): Payer: Self-pay | Admitting: *Deleted

## 2012-03-12 ENCOUNTER — Emergency Department (HOSPITAL_COMMUNITY)
Admission: EM | Admit: 2012-03-12 | Discharge: 2012-03-13 | Disposition: A | Discharge status: Home / Self Care | Payer: Medicare Other | Attending: Emergency Medicine | Admitting: Emergency Medicine

## 2012-03-12 ENCOUNTER — Emergency Department (HOSPITAL_COMMUNITY): Payer: Medicare Other

## 2012-03-12 DIAGNOSIS — R3915 Urgency of urination: Secondary | ICD-10-CM | POA: Insufficient documentation

## 2012-03-12 DIAGNOSIS — I10 Essential (primary) hypertension: Secondary | ICD-10-CM | POA: Diagnosis not present

## 2012-03-12 DIAGNOSIS — Z7982 Long term (current) use of aspirin: Secondary | ICD-10-CM | POA: Diagnosis not present

## 2012-03-12 DIAGNOSIS — Z7901 Long term (current) use of anticoagulants: Secondary | ICD-10-CM | POA: Insufficient documentation

## 2012-03-12 DIAGNOSIS — H9190 Unspecified hearing loss, unspecified ear: Secondary | ICD-10-CM | POA: Diagnosis not present

## 2012-03-12 DIAGNOSIS — Z8546 Personal history of malignant neoplasm of prostate: Secondary | ICD-10-CM | POA: Diagnosis not present

## 2012-03-12 DIAGNOSIS — S2239XA Fracture of one rib, unspecified side, initial encounter for closed fracture: Secondary | ICD-10-CM | POA: Diagnosis not present

## 2012-03-12 DIAGNOSIS — G4733 Obstructive sleep apnea (adult) (pediatric): Secondary | ICD-10-CM | POA: Insufficient documentation

## 2012-03-12 DIAGNOSIS — Y929 Unspecified place or not applicable: Secondary | ICD-10-CM | POA: Insufficient documentation

## 2012-03-12 DIAGNOSIS — Z85820 Personal history of malignant melanoma of skin: Secondary | ICD-10-CM | POA: Insufficient documentation

## 2012-03-12 DIAGNOSIS — E785 Hyperlipidemia, unspecified: Secondary | ICD-10-CM | POA: Insufficient documentation

## 2012-03-12 DIAGNOSIS — I2510 Atherosclerotic heart disease of native coronary artery without angina pectoris: Secondary | ICD-10-CM | POA: Diagnosis not present

## 2012-03-12 DIAGNOSIS — R1011 Right upper quadrant pain: Secondary | ICD-10-CM | POA: Diagnosis not present

## 2012-03-12 DIAGNOSIS — Z9861 Coronary angioplasty status: Secondary | ICD-10-CM | POA: Diagnosis not present

## 2012-03-12 DIAGNOSIS — Z79899 Other long term (current) drug therapy: Secondary | ICD-10-CM | POA: Diagnosis not present

## 2012-03-12 DIAGNOSIS — R079 Chest pain, unspecified: Secondary | ICD-10-CM | POA: Diagnosis not present

## 2012-03-12 DIAGNOSIS — Y9389 Activity, other specified: Secondary | ICD-10-CM | POA: Insufficient documentation

## 2012-03-12 DIAGNOSIS — X500XXA Overexertion from strenuous movement or load, initial encounter: Secondary | ICD-10-CM | POA: Insufficient documentation

## 2012-03-12 DIAGNOSIS — E782 Mixed hyperlipidemia: Secondary | ICD-10-CM | POA: Diagnosis not present

## 2012-03-12 NOTE — ED Notes (Signed)
Pt c/o right upper abd pain x 2-3 months; tonight coughed and pain went from right upper abd to epigastric area; pt saw doctor on Friday and again this morning and was told was muscle spasms; ekg done at time of arrival

## 2012-03-13 ENCOUNTER — Emergency Department (HOSPITAL_COMMUNITY): Payer: Medicare Other

## 2012-03-13 DIAGNOSIS — R1011 Right upper quadrant pain: Secondary | ICD-10-CM | POA: Diagnosis not present

## 2012-03-13 LAB — COMPREHENSIVE METABOLIC PANEL
ALT: 15 U/L (ref 0–53)
AST: 17 U/L (ref 0–37)
Albumin: 3.7 g/dL (ref 3.5–5.2)
Alkaline Phosphatase: 64 U/L (ref 39–117)
BUN: 18 mg/dL (ref 6–23)
CO2: 24 mEq/L (ref 19–32)
Calcium: 8.8 mg/dL (ref 8.4–10.5)
Chloride: 101 mEq/L (ref 96–112)
Creatinine, Ser: 0.98 mg/dL (ref 0.50–1.35)
GFR calc Af Amer: >90 mL/min (ref >90)
GFR calc non Af Amer: 79 mL/min — ABNORMAL LOW (ref >90)
Glucose, Bld: 129 mg/dL — ABNORMAL HIGH (ref 70–99)
Potassium: 3.3 mEq/L — ABNORMAL LOW (ref 3.5–5.1)
Sodium: 137 mEq/L (ref 135–145)
Total Bilirubin: 0.3 mg/dL (ref 0.3–1.2)
Total Protein: 7 g/dL (ref 6.0–8.3)

## 2012-03-13 LAB — URINALYSIS, ROUTINE W REFLEX MICROSCOPIC
Bilirubin Urine: NEGATIVE
Glucose, UA: NEGATIVE mg/dL
Hgb urine dipstick: NEGATIVE
Ketones, ur: 15 mg/dL — AB
Leukocytes, UA: NEGATIVE
Nitrite: NEGATIVE
Protein, ur: 30 mg/dL — AB
Specific Gravity, Urine: 1.031 — ABNORMAL HIGH (ref 1.005–1.030)
Urobilinogen, UA: 1 mg/dL (ref 0.0–1.0)
pH: 6.5 (ref 5.0–8.0)

## 2012-03-13 LAB — URINE MICROSCOPIC-ADD ON

## 2012-03-13 LAB — CBC WITH DIFFERENTIAL/PLATELET
Basophils Absolute: 0 10*3/uL (ref 0.0–0.1)
Basophils Relative: 0 % (ref 0–1)
Eosinophils Absolute: 0.1 10*3/uL (ref 0.0–0.7)
Eosinophils Relative: 1 % (ref 0–5)
HCT: 39.2 % (ref 39.0–52.0)
Hemoglobin: 13.6 g/dL (ref 13.0–17.0)
Lymphocytes Relative: 10 % — ABNORMAL LOW (ref 12–46)
Lymphs Abs: 1.3 10*3/uL (ref 0.7–4.0)
MCH: 29 pg (ref 26.0–34.0)
MCHC: 34.7 g/dL (ref 30.0–36.0)
MCV: 83.6 fL (ref 78.0–100.0)
Monocytes Absolute: 1.1 10*3/uL — ABNORMAL HIGH (ref 0.1–1.0)
Monocytes Relative: 8 % (ref 3–12)
Neutro Abs: 10.4 10*3/uL — ABNORMAL HIGH (ref 1.7–7.7)
Neutrophils Relative %: 80 % — ABNORMAL HIGH (ref 43–77)
Platelets: 193 10*3/uL (ref 150–400)
RBC: 4.69 MIL/uL (ref 4.22–5.81)
RDW: 14 % (ref 11.5–15.5)
WBC: 13 10*3/uL — ABNORMAL HIGH (ref 4.0–10.5)

## 2012-03-13 MED ORDER — HYDROMORPHONE HCL PF 1 MG/ML IJ SOLN
1.0000 mg | Freq: Once | INTRAMUSCULAR | Status: AC
  Administered 2012-03-13: 1 mg via INTRAVENOUS
  Filled 2012-03-13: qty 1

## 2012-03-13 MED ORDER — FENTANYL CITRATE 0.05 MG/ML IJ SOLN
100.0000 ug | Freq: Once | INTRAMUSCULAR | Status: AC
  Administered 2012-03-13: 100 ug via INTRAVENOUS
  Filled 2012-03-13: qty 2

## 2012-03-13 MED ORDER — OXYCODONE-ACETAMINOPHEN 5-325 MG PO TABS: 1.0000 | ORAL_TABLET | ORAL | Status: DC | PRN

## 2012-03-13 NOTE — ED Notes (Signed)
Pt c/o rt lower rib pain to anterior and posterior; tender to palpation; pain is worse with movement or cough; pt states has had cough and congestion for several weeks; had a recent chest X-ray and was told it was a muscle strain; pt reports that sneezed and coughed this afternoon and had a sudden increase in pain; pt states "It was a like a lightning bolt hit me"; pt reports that the pain has been consistently worse since the episode this afternoon.

## 2012-03-13 NOTE — ED Notes (Signed)
Patient transported to CT 

## 2012-03-13 NOTE — ED Provider Notes (Signed)
Medical screening examination/treatment/procedure(s) were conducted as a shared visit with non-physician practitioner(s) and myself.  I personally evaluated the patient during the encounter  Please see my other note  Hoy Morn, MD 03/13/12 [phone]

## 2012-03-13 NOTE — ED Provider Notes (Signed)
Medical screening examination/treatment/procedure(s) were conducted as a shared visit with non-physician practitioner(s) and myself.  I personally evaluated the patient during the encounter.   The patient has what appears to be a mildly displaced right-sided rib fractures a cause of his pain.  This was found on repeat evaluation of his imaging obtained earlier this evening.  Please see addendum  I reviewed all labs and imaging completed today. I personally reviewed the images.   Ct Abdomen Pelvis Wo Contrast  03/13/2012  **ADDENDUM** CREATED: 03/13/2012 06:37:37  In the right lower anterior chest wall, there appears to be separation of the costal cartilage from the 10th rib with associated soft tissue swelling in the chest wall musculature consistent with fracture and associated hematoma.  **END ADDENDUM** SIGNED BY: Neale Burly, M.D.   03/13/2012  *RADIOLOGY REPORT*  Clinical Data: Right upper quadrant pain for two or 3 months, increasing tonight.  White cell count 13.  History of prostate cancer.  Previous history of contrast allergy with anaphylaxis.  CT ABDOMEN AND PELVIS WITHOUT CONTRAST  Technique:  Multidetector CT imaging of the abdomen and pelvis was performed following the standard protocol without intravenous contrast.  Comparison: None.  Findings: Small right pleural effusion.  Atelectasis in both lung bases.  Coronary artery calcification.  The unenhanced appearance of the liver, spleen, gallbladder, pancreas, adrenal glands, kidneys, and retroperitoneal lymph nodes is unremarkable.  Calcification of the abdominal aorta without aneurysm.  The stomach, small bowel, and colon are not distended. No focal wall thickening appreciated.  Stool filled colon.  No free air or free fluid in the abdomen.  Pelvis:  Multiple metallic seed implants in the prostate gland.  No significant prostatic enlargement.  No bladder wall thickening.  No free or loculated pelvic fluid collections.  No significant  pelvic lymphadenopathy.  Fat in the inguinal canals.  Diverticula in the sigmoid colon without diverticulitis.  Normal appendix. Degenerative changes in the lumbar spine.  Normal alignment.  No sclerotic bone lesions appreciated.  Degenerative changes in the hips.  IMPRESSION: No acute process demonstrated in the abdomen or pelvis.  Small right pleural effusion with atelectasis in both lung bases.   Original Report Authenticated By: Lucienne Capers, M.D.    Dg Chest 2 View  03/12/2012  *RADIOLOGY REPORT*  Clinical Data: Left lower chest pain radiating to the back. Abdominal pain.  CHEST - 2 VIEW  Comparison: 03/08/2012  Findings: Slightly shallow inspiration. The heart size and pulmonary vascularity are normal. The lungs appear clear and expanded without focal air space disease or consolidation. No blunting of the costophrenic angles.  No pneumothorax.  Mediastinal contours appear intact.  Degenerative changes in the thoracic spine and shoulders.  No significant change since previous study.  IMPRESSION: No evidence of active pulmonary disease.   Original Report Authenticated By: Lucienne Capers, M.D.     Results for orders placed during the hospital encounter of 03/12/12  CBC WITH DIFFERENTIAL      Component Value Range   WBC 13.0 (*) 4.0 - 10.5 K/uL   RBC 4.69  4.22 - 5.81 MIL/uL   Hemoglobin 13.6  13.0 - 17.0 g/dL   HCT 39.2  39.0 - 52.0 %   MCV 83.6  78.0 - 100.0 fL   MCH 29.0  26.0 - 34.0 pg   MCHC 34.7  30.0 - 36.0 g/dL   RDW 14.0  11.5 - 15.5 %   Platelets 193  150 - 400 K/uL   Neutrophils Relative 80 (*) 43 -  77 %   Neutro Abs 10.4 (*) 1.7 - 7.7 K/uL   Lymphocytes Relative 10 (*) 12 - 46 %   Lymphs Abs 1.3  0.7 - 4.0 K/uL   Monocytes Relative 8  3 - 12 %   Monocytes Absolute 1.1 (*) 0.1 - 1.0 K/uL   Eosinophils Relative 1  0 - 5 %   Eosinophils Absolute 0.1  0.0 - 0.7 K/uL   Basophils Relative 0  0 - 1 %   Basophils Absolute 0.0  0.0 - 0.1 K/uL  COMPREHENSIVE METABOLIC PANEL       Component Value Range   Sodium 137  135 - 145 mEq/L   Potassium 3.3 (*) 3.5 - 5.1 mEq/L   Chloride 101  96 - 112 mEq/L   CO2 24  19 - 32 mEq/L   Glucose, Bld 129 (*) 70 - 99 mg/dL   BUN 18  6 - 23 mg/dL   Creatinine, Ser 0.98  0.50 - 1.35 mg/dL   Calcium 8.8  8.4 - 10.5 mg/dL   Total Protein 7.0  6.0 - 8.3 g/dL   Albumin 3.7  3.5 - 5.2 g/dL   AST 17  0 - 37 U/L   ALT 15  0 - 53 U/L   Alkaline Phosphatase 64  39 - 117 U/L   Total Bilirubin 0.3  0.3 - 1.2 mg/dL   GFR calc non Af Amer 79 (*) >90 mL/min   GFR calc Af Amer >90  >90 mL/min  URINALYSIS, ROUTINE W REFLEX MICROSCOPIC      Component Value Range   Color, Urine YELLOW  YELLOW   APPearance CLOUDY (*) CLEAR   Specific Gravity, Urine 1.031 (*) 1.005 - 1.030   pH 6.5  5.0 - 8.0   Glucose, UA NEGATIVE  NEGATIVE mg/dL   Hgb urine dipstick NEGATIVE  NEGATIVE   Bilirubin Urine NEGATIVE  NEGATIVE   Ketones, ur 15 (*) NEGATIVE mg/dL   Protein, ur 30 (*) NEGATIVE mg/dL   Urobilinogen, UA 1.0  0.0 - 1.0 mg/dL   Nitrite NEGATIVE  NEGATIVE   Leukocytes, UA NEGATIVE  NEGATIVE  URINE MICROSCOPIC-ADD ON      Component Value Range   Squamous Epithelial / LPF FEW (*) RARE   Urine-Other MUCOUS PRESENT      I personally reviewed the imaging tests through PACS system I reviewed available ER/hospitalization records through the EMR   Ct Abdomen Pelvis Wo Contrast  03/13/2012  **ADDENDUM** CREATED: 03/13/2012 06:37:37  In the right lower anterior chest wall, there appears to be separation of the costal cartilage from the 10th rib with associated soft tissue swelling in the chest wall musculature consistent with fracture and associated hematoma.  **END ADDENDUM** SIGNED BY: Neale Burly, M.D.   03/13/2012  *RADIOLOGY REPORT*  Clinical Data: Right upper quadrant pain for two or 3 months, increasing tonight.  White cell count 13.  History of prostate cancer.  Previous history of contrast allergy with anaphylaxis.  CT ABDOMEN AND PELVIS  WITHOUT CONTRAST  Technique:  Multidetector CT imaging of the abdomen and pelvis was performed following the standard protocol without intravenous contrast.  Comparison: None.  Findings: Small right pleural effusion.  Atelectasis in both lung bases.  Coronary artery calcification.  The unenhanced appearance of the liver, spleen, gallbladder, pancreas, adrenal glands, kidneys, and retroperitoneal lymph nodes is unremarkable.  Calcification of the abdominal aorta without aneurysm.  The stomach, small bowel, and colon are not distended. No focal wall thickening appreciated.  Stool filled colon.  No free air or free fluid in the abdomen.  Pelvis:  Multiple metallic seed implants in the prostate gland.  No significant prostatic enlargement.  No bladder wall thickening.  No free or loculated pelvic fluid collections.  No significant pelvic lymphadenopathy.  Fat in the inguinal canals.  Diverticula in the sigmoid colon without diverticulitis.  Normal appendix. Degenerative changes in the lumbar spine.  Normal alignment.  No sclerotic bone lesions appreciated.  Degenerative changes in the hips.  IMPRESSION: No acute process demonstrated in the abdomen or pelvis.  Small right pleural effusion with atelectasis in both lung bases.   Original Report Authenticated By: Lucienne Capers, M.D.    Dg Chest 2 View  03/12/2012  *RADIOLOGY REPORT*  Clinical Data: Left lower chest pain radiating to the back. Abdominal pain.  CHEST - 2 VIEW  Comparison: 03/08/2012  Findings: Slightly shallow inspiration. The heart size and pulmonary vascularity are normal. The lungs appear clear and expanded without focal air space disease or consolidation. No blunting of the costophrenic angles.  No pneumothorax.  Mediastinal contours appear intact.  Degenerative changes in the thoracic spine and shoulders.  No significant change since previous study.  IMPRESSION: No evidence of active pulmonary disease.   Original Report Authenticated By: Lucienne Capers, M.D.       Hoy Morn, MD 03/13/12 [phone]

## 2012-03-13 NOTE — ED Provider Notes (Signed)
History     CSN: [id number]  Arrival date & time 03/12/12  2115   First MD Initiated Contact with Patient 03/13/12 0116      Chief Complaint  Patient presents with  . Abdominal Pain    (Consider location/radiation/quality/duration/timing/severity/associated sxs/prior treatment) HPI History provided by pt.   Pt has had constant pain in LUQ that wraps around to L flank for the past three months. Has been diagnosed w/ muscle strain.  No relief w/ OTC analgesics.  Developed a URI approx 1 week ago and coughing has aggravated pain.  Yesterday afternoon, the pain became acutely severe when he coughed, and has been severe ever since.  Aggravated by deep inspiration, coughing, laughing and movement.  No associated fever, SOB, N/V/D, urinary sx, LE edema/pain.  Denies trauma.    Past Medical History  Diagnosis Date  . History of prostate cancer 2004--  S/P SEED IMPLANTS     NO RECURRENCE  . S/P primary angioplasty with coronary stent 2006--  X2 DE STENTS  . Urgency of urination   . Frequency of urination   . H/O: gout STABLE  . OSA on CPAP   . Impaired hearing RIGHT HEARING AID  . Hydrocele, left   . History of melanoma excision 2010    FOREHEAD  . Coronary artery disease CARDIOLOGIST-  DR DAVID HARDING    LAST VISIT 2 WKS AGO--  REQUESTED NOTE, STRESS TEST AND EKG  . LVH (left ventricular hypertrophy) due to hypertensive disease MODERATE  . Hyperlipidemia   . Hypertension     Past Surgical History  Procedure Date  . Prostate palladium gold seed implants (118) 11-15-2002    PROSTATE CANCER  . Excision melanoma from forehead 2010  . Coronary angioplasty with stent placement 09-01-2004  DR GAMBLE    X2  DRUG-ELUTING STENTS (CYPHER)  DISTAL TO MID  LEFT CIRCUMFLEX  (REDUCTION 75% LESIONS TO 0% RESIDUAL)  . Cardiovascular stress test 06-10-2011  DR DAVID HARDING    NORMAL PERFUSION STUDY/ NO SIGNIFICANT DEMOSTRATED/ LOW RISK SCAN/ NO CHANGE COMPARED TO LAST STUDY/ EF 59%  .  Transthoracic echocardiogram [facility] 2011    NORMAL EF/ NORMAL FUNCTION WITH IMPAIRED RELAXATION AND MODERATE CONCENTRIC LVH LIKELY HYPERTENSIVE DISEASE  . Hydrocele excision 10/14/2011    Procedure: HYDROCELECTOMY ADULT;  Surgeon: Ailene Rud, MD;  Location: [facility];  Service: Urology;  Laterality: Left;    No family history on file.  History  Substance Use Topics  . Smoking status: Never Smoker   . Smokeless tobacco: Never Used  . Alcohol Use: Yes     Comment: OCCASIONAL      Review of Systems  All other systems reviewed and are negative.    Allergies  Betadine; Contrast media; and Shellfish allergy  Home Medications   Current Outpatient Rx  Name  Route  Sig  Dispense  Refill  . ACETAMINOPHEN 500 MG PO TABS   Oral   Take 1,000 mg by mouth every 6 (six) hours as needed. For pain.         Marland Kitchen ALLOPURINOL 300 MG PO TABS   Oral   Take 300 mg by mouth daily.          Marland Kitchen AMLODIPINE BESYLATE 10 MG PO TABS   Oral   Take 10 mg by mouth daily.          . ASPIRIN EC 81 MG PO TBEC   Oral   Take 81 mg by mouth daily.         Marland Kitchen  ATORVASTATIN CALCIUM 20 MG PO TABS   Oral   Take 20 mg by mouth daily.         Marland Kitchen CLOPIDOGREL BISULFATE 75 MG PO TABS   Oral   Take 75 mg by mouth daily.          Marland Kitchen DARIFENACIN HYDROBROMIDE ER 15 MG PO TB24   Oral   Take 15 mg by mouth daily.         Marland Kitchen DEXTROMETHORPHAN POLISTIREX ER 30 MG/5ML PO LQCR   Oral   Take 60 mg by mouth 2 (two) times daily as needed. For cough.         . ESCITALOPRAM OXALATE 10 MG PO TABS   Oral   Take 10 mg by mouth daily.          . GUAIFENESIN ER 600 MG PO TB12   Oral   Take 600 mg by mouth 2 (two) times daily as needed. For cough.         Marland Kitchen NAPROXEN SODIUM 220 MG PO TABS   Oral   Take 220 mg by mouth 2 (two) times daily as needed. For pain.         Marland Kitchen TAMSULOSIN HCL 0.4 MG PO CAPS   Oral   Take 0.4 mg by mouth daily after supper.         Marland Kitchen VALSARTAN 80 MG  PO TABS   Oral   Take 80 mg by mouth daily.            BP 175/86  Pulse 58  Temp 97.3 F (36.3 C) (Oral)  Resp 21  Ht 5\' 10"  (1.778 m)  Wt 213 lb (96.616 kg)  BMI 30.56 kg/m2  SpO2 97%  Physical Exam  Nursing note and vitals reviewed. Constitutional: He is oriented to person, place, and time. He appears well-developed and well-nourished. No distress.       Uncomfortable appearing, particularly w/ movement  HENT:  Head: Normocephalic and atraumatic.  Eyes:       Normal appearance  Neck: Normal range of motion.  Cardiovascular: Normal rate and regular rhythm.   Pulmonary/Chest: Effort normal and breath sounds normal. No respiratory distress.  Abdominal: Soft. Bowel sounds are normal. He exhibits no distension and no mass. There is no rebound and no guarding.       Mild epigastric, RUQ, R side and R flank, including CVA, tenderness.  Severe tenderness over lateral and posterior lower ribs.  No rash or ecchymosis.   Genitourinary:       Mild R CVA ttp  Musculoskeletal: Normal range of motion.       No spinal tenderness.  No swelling or tenderness LEs.   Neurological: He is alert and oriented to person, place, and time.  Skin: Skin is warm and dry. No rash noted.  Psychiatric: He has a normal mood and affect. His behavior is normal.    ED Course  Procedures (including critical care time)   Date: 03/13/2012  Rate: 64 Rhythm: normal sinus rhythm  QRS Axis: normal  Intervals: normal  ST/T Wave abnormalities: normal  Conduction Disutrbances:none  Narrative Interpretation:   Old EKG Reviewed: none available   Labs Reviewed  CBC WITH DIFFERENTIAL - Abnormal; Notable for the following:    WBC 13.0 (*)     Neutrophils Relative 80 (*)     Neutro Abs 10.4 (*)     Lymphocytes Relative 10 (*)     Monocytes Absolute 1.1 (*)  All other components within normal limits  COMPREHENSIVE METABOLIC PANEL - Abnormal; Notable for the following:    Potassium 3.3 (*)     Glucose,  Bld 129 (*)     GFR calc non Af Amer 79 (*)     All other components within normal limits  URINALYSIS, ROUTINE W REFLEX MICROSCOPIC   Ct Abdomen Pelvis Wo Contrast  03/13/2012  *RADIOLOGY REPORT*  Clinical Data: Right upper quadrant pain for two or 3 months, increasing tonight.  White cell count 13.  History of prostate cancer.  Previous history of contrast allergy with anaphylaxis.  CT ABDOMEN AND PELVIS WITHOUT CONTRAST  Technique:  Multidetector CT imaging of the abdomen and pelvis was performed following the standard protocol without intravenous contrast.  Comparison: None.  Findings: Small right pleural effusion.  Atelectasis in both lung bases.  Coronary artery calcification.  The unenhanced appearance of the liver, spleen, gallbladder, pancreas, adrenal glands, kidneys, and retroperitoneal lymph nodes is unremarkable.  Calcification of the abdominal aorta without aneurysm.  The stomach, small bowel, and colon are not distended. No focal wall thickening appreciated.  Stool filled colon.  No free air or free fluid in the abdomen.  Pelvis:  Multiple metallic seed implants in the prostate gland.  No significant prostatic enlargement.  No bladder wall thickening.  No free or loculated pelvic fluid collections.  No significant pelvic lymphadenopathy.  Fat in the inguinal canals.  Diverticula in the sigmoid colon without diverticulitis.  Normal appendix. Degenerative changes in the lumbar spine.  Normal alignment.  No sclerotic bone lesions appreciated.  Degenerative changes in the hips.  IMPRESSION: No acute process demonstrated in the abdomen or pelvis.  Small right pleural effusion with atelectasis in both lung bases.   Original Report Authenticated By: Lucienne Capers, M.D.    Dg Chest 2 View  03/12/2012  *RADIOLOGY REPORT*  Clinical Data: Left lower chest pain radiating to the back. Abdominal pain.  CHEST - 2 VIEW  Comparison: 03/08/2012  Findings: Slightly shallow inspiration. The heart size and  pulmonary vascularity are normal. The lungs appear clear and expanded without focal air space disease or consolidation. No blunting of the costophrenic angles.  No pneumothorax.  Mediastinal contours appear intact.  Degenerative changes in the thoracic spine and shoulders.  No significant change since previous study.  IMPRESSION: No evidence of active pulmonary disease.   Original Report Authenticated By: Lucienne Capers, M.D.      1. Rib fracture       MDM  [phone] M presents w/ RUQ and R flank pain for the past 3 months, acutely worsened after coughing yesterday afternoon.  Pleuritic.  No associated sx.  On exam, mild epigastric and RUQ ttp and severe right lower lateral and posterior rib tenderness.  Labs unremarkable w/ exception of mild leukocytosis.  CXR and CT abd/pelvis negative.  Pt has had minimal relief w/ IV dilaudid and exam unchanged.  Dr. Venora Maples to see.          Remer Macho, PA-C 03/13/12 [phone]

## 2012-03-15 DIAGNOSIS — R1013 Epigastric pain: Secondary | ICD-10-CM | POA: Diagnosis not present

## 2012-03-15 DIAGNOSIS — K5900 Constipation, unspecified: Secondary | ICD-10-CM | POA: Diagnosis not present

## 2012-03-15 DIAGNOSIS — R071 Chest pain on breathing: Secondary | ICD-10-CM | POA: Diagnosis not present

## 2012-03-16 ENCOUNTER — Telehealth: Payer: Self-pay | Admitting: Internal Medicine

## 2012-03-16 NOTE — Telephone Encounter (Signed)
Spoke with patient and he states he was seen in ED on 03/12/12 for fractured rib, RUQ pain. He was given pain medication for this. He did not have a bowel movement for 5 days and was given suppositories by his PCP. He has had multiple stools. He is calling to report 3 days of pain in the center of his stomach that is terrible pain. Patient stops throughout the conversation and moans/yells out then laughs. He states the pain is about every 15 seconds. He denies any blood in stools or vomiting. States he was making his wife nervous and he told her to go out for awhile. Patient states he does not want to go to ED to be evaluated because he was there 10 hours last time. Explained to patient I have no appointments this afternoon with extender or Dr. Olevia Perches. He states his PCP could not see him either. Patient does not want to wait until Monday. He will go to Urgent Care or ED to be evaluated.

## 2012-03-17 DIAGNOSIS — R1013 Epigastric pain: Secondary | ICD-10-CM | POA: Diagnosis not present

## 2012-03-17 DIAGNOSIS — K2990 Gastroduodenitis, unspecified, without bleeding: Secondary | ICD-10-CM | POA: Diagnosis not present

## 2012-03-17 DIAGNOSIS — R21 Rash and other nonspecific skin eruption: Secondary | ICD-10-CM | POA: Diagnosis not present

## 2012-03-17 DIAGNOSIS — K2970 Gastritis, unspecified, without bleeding: Secondary | ICD-10-CM | POA: Diagnosis not present

## 2012-03-18 NOTE — Telephone Encounter (Signed)
I have spoken to the pt at length. His pain seems to be related to rib fracture. There is no GI component. I suggested Vicodin , heating pad, possibly an abdominal binder, call his PCP for further management.

## 2012-03-19 ENCOUNTER — Telehealth: Payer: Self-pay | Admitting: Internal Medicine

## 2012-03-19 ENCOUNTER — Other Ambulatory Visit (HOSPITAL_COMMUNITY): Payer: Medicare Other

## 2012-03-19 ENCOUNTER — Ambulatory Visit (INDEPENDENT_AMBULATORY_CARE_PROVIDER_SITE_OTHER): Payer: Medicare Other | Admitting: Physician Assistant

## 2012-03-19 ENCOUNTER — Ambulatory Visit (HOSPITAL_COMMUNITY): Payer: Medicare Other

## 2012-03-19 ENCOUNTER — Ambulatory Visit (INDEPENDENT_AMBULATORY_CARE_PROVIDER_SITE_OTHER)
Admission: RE | Admit: 2012-03-19 | Discharge: 2012-03-19 | Disposition: A | Discharge status: Home / Self Care | Payer: Medicare Other | Source: Ambulatory Visit | Attending: Physician Assistant | Admitting: Physician Assistant

## 2012-03-19 ENCOUNTER — Encounter: Payer: Self-pay | Admitting: *Deleted

## 2012-03-19 VITALS — BP 130/90 | HR 72 | Ht 70.0 in | Wt 210.0 lb

## 2012-03-19 DIAGNOSIS — S20219A Contusion of unspecified front wall of thorax, initial encounter: Secondary | ICD-10-CM | POA: Diagnosis not present

## 2012-03-19 DIAGNOSIS — Z87898 Personal history of other specified conditions: Secondary | ICD-10-CM

## 2012-03-19 DIAGNOSIS — Z9989 Dependence on other enabling machines and devices: Secondary | ICD-10-CM | POA: Insufficient documentation

## 2012-03-19 DIAGNOSIS — R079 Chest pain, unspecified: Secondary | ICD-10-CM | POA: Diagnosis not present

## 2012-03-19 DIAGNOSIS — E785 Hyperlipidemia, unspecified: Secondary | ICD-10-CM

## 2012-03-19 DIAGNOSIS — R0781 Pleurodynia: Secondary | ICD-10-CM

## 2012-03-19 DIAGNOSIS — K5730 Diverticulosis of large intestine without perforation or abscess without bleeding: Secondary | ICD-10-CM | POA: Diagnosis not present

## 2012-03-19 DIAGNOSIS — R1013 Epigastric pain: Secondary | ICD-10-CM

## 2012-03-19 DIAGNOSIS — Z9889 Other specified postprocedural states: Secondary | ICD-10-CM | POA: Insufficient documentation

## 2012-03-19 DIAGNOSIS — Z8546 Personal history of malignant neoplasm of prostate: Secondary | ICD-10-CM

## 2012-03-19 DIAGNOSIS — Z8781 Personal history of (healed) traumatic fracture: Secondary | ICD-10-CM

## 2012-03-19 DIAGNOSIS — S301XXA Contusion of abdominal wall, initial encounter: Secondary | ICD-10-CM

## 2012-03-19 DIAGNOSIS — Z860100 Personal history of colon polyps, unspecified: Secondary | ICD-10-CM

## 2012-03-19 DIAGNOSIS — J90 Pleural effusion, not elsewhere classified: Secondary | ICD-10-CM | POA: Diagnosis not present

## 2012-03-19 DIAGNOSIS — I2510 Atherosclerotic heart disease of native coronary artery without angina pectoris: Secondary | ICD-10-CM

## 2012-03-19 DIAGNOSIS — Z85820 Personal history of malignant melanoma of skin: Secondary | ICD-10-CM

## 2012-03-19 DIAGNOSIS — Z87438 Personal history of other diseases of male genital organs: Secondary | ICD-10-CM | POA: Insufficient documentation

## 2012-03-19 DIAGNOSIS — I10 Essential (primary) hypertension: Secondary | ICD-10-CM

## 2012-03-19 DIAGNOSIS — G4730 Sleep apnea, unspecified: Secondary | ICD-10-CM

## 2012-03-19 DIAGNOSIS — G4733 Obstructive sleep apnea (adult) (pediatric): Secondary | ICD-10-CM | POA: Insufficient documentation

## 2012-03-19 NOTE — Progress Notes (Signed)
Subjective:    Patient ID: [name], male    DOB: [birth year], 76 y.o.   MRN: [id number]  HPI [name] is a pleasant 76 year old white male known to Dr. Olevia Perches from prior colonoscopy. He has a history of hyperplastic polyps in last had colonoscopy in 2007. He also has history of prostate cancer for which he underwent seed implants several years ago. He had a hydrocele repair by Dr. Gaynelle Arabian earlier this year. He also has history of coronary artery disease and had  2  drug-eluting stents placed in 2006 and is maintained on chronic Plavix. Patient and his wife both relates that he had had complaint of vague right lateral lower chest and upper abdominal discomfort over the past couple of months. He had seen his primary care doctor Dr. Redmond Pulling a couple of weeks ago because he also had some cold symptoms and was told to alternate Tylenol and Aleve and also had taken some over-the-counter cold medicine. On 03/09/2012 after you been on those medicines for about 4 days he had a bad coughing spell at home and then developed acute severe right lower chest and right abdominal pain. He was concerned that he was having a cardiac event and went to the emergency room. He was given Dilaudid and naproxen for pain. He had CT scan of the abdomen and pelvis done which showed a small right effusion and a right 10th rib fracture with small hematoma over the right lower anterior chest wall This was done without any contrast. His white count was noted to be elevated at 13,000 hemoglobin 13.6 hematocrit of 39.2. He was given Percocet to take for pain at home but says after that his epigastric area started hurting worse and he was afraid it was from the Percocet so he stopped this and has been taking hydrocodone since on a regular basis but without any relief and his pain He has also been taking Protonix 40 mg per day. He says his pain is intense and describes it as a burning hot stabbing feeling in the epigastrium. He also  developed significant bruising along the lower abdominal wall over the weekend. He has been constipated. No melena or hematochezia. No nausea or vomiting. His appetite has been fine and he does not feel that his pain is affected by by mouth intake at all.    Review of Systems  Constitutional: Positive for activity change.  HENT: Negative.   Eyes: Negative.   Respiratory: Negative.   Cardiovascular: Negative.   Gastrointestinal: Positive for abdominal pain and constipation.  Genitourinary: Negative.   Musculoskeletal: Negative.   Skin: Negative.   Neurological: Negative.   Hematological: Bruises/bleeds easily.  Psychiatric/Behavioral: Negative.    Outpatient Encounter Prescriptions as of 03/19/2012  Medication Sig Dispense Refill  . acetaminophen (TYLENOL) 500 MG tablet Take 1,000 mg by mouth every 6 (six) hours as needed. For pain.      Marland Kitchen allopurinol (ZYLOPRIM) 300 MG tablet Take 300 mg by mouth daily.       Marland Kitchen amLODipine (NORVASC) 10 MG tablet Take 10 mg by mouth daily.       Marland Kitchen aspirin EC 81 MG tablet Take 81 mg by mouth daily.      Marland Kitchen atorvastatin (LIPITOR) 20 MG tablet Take 20 mg by mouth daily.      . clopidogrel (PLAVIX) 75 MG tablet Take 75 mg by mouth daily.       Marland Kitchen darifenacin (ENABLEX) 15 MG 24 hr tablet Take 15 mg by mouth daily.      Marland Kitchen  dextromethorphan (DELSYM) 30 MG/5ML liquid Take 60 mg by mouth 2 (two) times daily as needed. For cough.      . escitalopram (LEXAPRO) 10 MG tablet Take 10 mg by mouth daily.       . Hydrocodone-Acetaminophen 2.5-325 MG TABS Take 1 tablet by mouth every 4 (four) hours.      . pantoprazole (PROTONIX) 40 MG tablet Take 40 mg by mouth daily.      . valsartan (DIOVAN) 80 MG tablet Take 80 mg by mouth daily.       . [DISCONTINUED] guaiFENesin (MUCINEX) 600 MG 12 hr tablet Take 600 mg by mouth 2 (two) times daily as needed. For cough.      . [DISCONTINUED] naproxen sodium (ANAPROX) 220 MG tablet Take 220 mg by mouth 2 (two) times daily as needed.  For pain.      . [DISCONTINUED] oxyCODONE-acetaminophen (PERCOCET/ROXICET) 5-325 MG per tablet Take 1 tablet by mouth every 4 (four) hours as needed for pain.  30 tablet  0  . [DISCONTINUED] Tamsulosin HCl (FLOMAX) 0.4 MG CAPS Take 0.4 mg by mouth daily after supper.       Allergies  Allergen Reactions  . Betadine (Povidone Iodine) Anaphylaxis  . Contrast Media (Iodinated Diagnostic Agents) Anaphylaxis  . Shellfish Allergy Anaphylaxis   Patient Active Problem List  Diagnosis  . CAD (coronary artery disease)  . Sleep apnea  . H/O prostate cancer  . HTN (hypertension)  . Hyperlipidemia  . H/O hydrocele  . Hx of melanoma excision  . Personal history of colonic polyps   History  Substance Use Topics  . Smoking status: Never Smoker   . Smokeless tobacco: Never Used  . Alcohol Use: Yes     Comment: OCCASIONAL   History reviewed. No pertinent family history.     Objective:   Physical Exam of well-developed older white male uncomfortable but in no acute distress, grunting with movement but still able to make jokes. Blood pressure 130/90 pulse 72 height 5 foot 10 weight 210. HEENT; nontraumatic normocephalic EOMI PERRLA sclera anicteric,Neck; Supple no JVD, Cardiovascular; regular rate and rhythm with S1-S2 no murmur or gallop, Pulmonary; clear bilaterally he is exquisitely tender to palpation over the right oral lateral ribs and along the right lower anterior rib margin. Abdomen ;soft nondistended bowel sounds are present he is tender in the epigastrium and along the right costal margin, he also has mild lower abdominal tenderness and a very large area of bruising in the right lower abdomen extending across midline. This area is not firm or fluctuant, the bruising does extend around laterally into his back.. Rectal; not done, Extremities ;no clubbing, cyanosis, or edema, skin warm and dry, Psych; mood and affect normal and appropriate.      Assessment & Plan:   #24 76 year old male  with one-week history of severe right lower chest right lateral abdomen pain now more focally in the epigastrium and described as a burning and stabbing in nature. Patient has a separated right 10th rib fracture with associated hematoma. I suspect that his pain is all secondary to the rib fracture and TB may be experiencing some radicular type pain due to edema and hematoma I am less concerned that he hasn't an acute intra-abdominal pathology, though he does obviously have a large abdominal wall #2 chronic antiplatelet therapy with Plavix #3 coronary artery disease status post drug-eluting stent x2, 2006 #4 history of prostate cancer status post seed implant #5 hypertension #6 history of melanoma-forehead /excised. #7 history  of hyperplastic colon polyps  Plan; Patient is having significant pain which is not controlled with hydrocodone. He has Percocet at home and is advised to use the Percocet one or 2 tablets every 4-6 hours as needed for pain. Local heat Increase Protonix to 40  mg by mouth twice daily Repeat CBC with differential today, will also check PSA and lipase Schedule for repeat CT scan of the abdomen and pelvis and lower chest this will be done with oral and no IV contrast. Will rule out enlarging hematoma and/or other acute intra-abdominal process. Further  plans pending results of CT  Addendum;-repeat CT scan shows a separated right 10th rib fracture with stable surrounding inflammatory changes and hematoma. He does have a right pleural effusion and some atelectasis, no evidence for bony metastases or other acute inflammatory process.-Results were discussed with the patient and his wife-plans as above, in addition have suggested he obtained an abdominal binder to wear over the next several days which may help decrease his pain. They will call back tomorrow with a progress report.

## 2012-03-19 NOTE — Progress Notes (Signed)
Reviewed and agree. ?? Continue Plavix in the setting of stable extrperitoneal  Hematoma. PPI, Miralax, Abd. Binder, sleep in semireclining position. Xanax or Flexaril may potentiate effect of Percocet and may be os some help

## 2012-03-19 NOTE — Telephone Encounter (Signed)
Spoke with patient and wife. He saw his PCP on Saturday. States they drew some labs and they are waiting on results- ?H.pylori. Patient states he is having terrible burning in upper abdomen. States it is not his rib fracture pain. Patient and wife insist on OV. Scheduled with Amy Esterwood, PA at 10:00 AM.

## 2012-03-19 NOTE — Patient Instructions (Addendum)
Increase the Protonix to twice daily. Take 1 dose of Miralax daily. Take the Percocet 1-2 every 4 hours as needed for pain.  You have been scheduled for a CT scan of the abdomen and pelvis at [facility] ([address]---this is in the same building as Press [facility]).   You are scheduled today 03-19-2012 at 3:00 PM . You should arrive at 2:45 PM  to your appointment time for registration. Please follow the written instructions below on the day of your exam:  WARNING: IF YOU ARE ALLERGIC TO IODINE/X-RAY DYE, PLEASE NOTIFY RADIOLOGY IMMEDIATELY AT [phone]! YOU WILL BE GIVEN A 13 HOUR PREMEDICATION PREP.  1) Do not eat or drink anything until after the test. 2) You have been given 2 bottles of oral contrast to drink. The solution may taste  better if refrigerated, but do NOT add ice or any other liquid to this solution. Shake well before drinking.    Drink 1 bottle of contrast @ 1PM (2 hours prior to your exam)  Drink 1 bottle of contrast @ 2PM  (1 hour prior to your exam)  You may take any medications as prescribed with a small amount of water except for the following: Metformin, Glucophage, Glucovance, Avandamet, Riomet, Fortamet, Actoplus Met, Janumet, Glumetza or Metaglip. The above medications must be held the day of the exam AND 48 hours after the exam.  The purpose of you drinking the oral contrast is to aid in the visualization of your intestinal tract. The contrast solution may cause some diarrhea. Before your exam is started, you will be given a small amount of fluid to drink. Depending on your individual set of symptoms, you may also receive an intravenous injection of x-ray contrast/dye. Plan on being at [facility] for 30 minutes or long, depending on the type of exam you are having performed.  If you have any questions regarding your exam or if you need to reschedule, you may call the CT department at [phone] between the hours of 8:00 am and 5:00  pm, Monday-Friday.  ________________________________________________________________________

## 2012-03-21 ENCOUNTER — Telehealth: Payer: Self-pay | Admitting: Internal Medicine

## 2012-03-21 NOTE — Telephone Encounter (Signed)
Spoke with patient's wife and told her patient should not take Advil due to the fact he is on a blood thinner and he had a hematoma into abdomen. She states he has not had a bowel movement since last Thursday. He will try Magnesium Citrate 1/2 bottle, wait a couple of hours and if no result take the other half of Magnesium Citrate. He will also increase Miralax to BID prn.

## 2012-04-13 DIAGNOSIS — R071 Chest pain on breathing: Secondary | ICD-10-CM | POA: Diagnosis not present

## 2012-04-13 DIAGNOSIS — I10 Essential (primary) hypertension: Secondary | ICD-10-CM | POA: Diagnosis not present

## 2012-04-16 DIAGNOSIS — J90 Pleural effusion, not elsewhere classified: Secondary | ICD-10-CM | POA: Diagnosis not present

## 2012-04-23 DIAGNOSIS — N529 Male erectile dysfunction, unspecified: Secondary | ICD-10-CM | POA: Diagnosis not present

## 2012-04-23 DIAGNOSIS — C61 Malignant neoplasm of prostate: Secondary | ICD-10-CM | POA: Diagnosis not present

## 2012-05-11 ENCOUNTER — Other Ambulatory Visit: Payer: Self-pay | Admitting: Internal Medicine

## 2012-05-11 DIAGNOSIS — K5900 Constipation, unspecified: Secondary | ICD-10-CM | POA: Diagnosis not present

## 2012-05-11 DIAGNOSIS — I10 Essential (primary) hypertension: Secondary | ICD-10-CM | POA: Diagnosis not present

## 2012-05-11 DIAGNOSIS — M549 Dorsalgia, unspecified: Secondary | ICD-10-CM

## 2012-05-11 DIAGNOSIS — R109 Unspecified abdominal pain: Secondary | ICD-10-CM | POA: Diagnosis not present

## 2012-05-11 DIAGNOSIS — G4733 Obstructive sleep apnea (adult) (pediatric): Secondary | ICD-10-CM | POA: Diagnosis not present

## 2012-05-18 ENCOUNTER — Other Ambulatory Visit: Payer: Medicare Other

## 2012-05-21 ENCOUNTER — Ambulatory Visit
Admission: RE | Admit: 2012-05-21 | Discharge: 2012-05-21 | Disposition: A | Discharge status: Home / Self Care | Payer: Medicare Other | Source: Ambulatory Visit | Attending: Internal Medicine | Admitting: Internal Medicine

## 2012-05-21 DIAGNOSIS — M549 Dorsalgia, unspecified: Secondary | ICD-10-CM

## 2012-05-21 DIAGNOSIS — M4804 Spinal stenosis, thoracic region: Secondary | ICD-10-CM | POA: Diagnosis not present

## 2012-05-21 MED ORDER — GADOBENATE DIMEGLUMINE 529 MG/ML IV SOLN
20.0000 mL | Freq: Once | INTRAVENOUS | Status: AC | PRN
  Administered 2012-05-21: 20 mL via INTRAVENOUS

## 2012-05-23 DIAGNOSIS — G959 Disease of spinal cord, unspecified: Secondary | ICD-10-CM | POA: Diagnosis not present

## 2012-06-11 DIAGNOSIS — IMO0002 Reserved for concepts with insufficient information to code with codable children: Secondary | ICD-10-CM | POA: Diagnosis not present

## 2012-06-11 DIAGNOSIS — M4714 Other spondylosis with myelopathy, thoracic region: Secondary | ICD-10-CM | POA: Diagnosis not present

## 2012-06-11 DIAGNOSIS — C412 Malignant neoplasm of vertebral column: Secondary | ICD-10-CM | POA: Diagnosis not present

## 2012-06-12 ENCOUNTER — Other Ambulatory Visit: Payer: Self-pay | Admitting: Neurosurgery

## 2012-06-14 DIAGNOSIS — I2510 Atherosclerotic heart disease of native coronary artery without angina pectoris: Secondary | ICD-10-CM | POA: Diagnosis not present

## 2012-06-14 DIAGNOSIS — Z9861 Coronary angioplasty status: Secondary | ICD-10-CM | POA: Diagnosis not present

## 2012-06-14 DIAGNOSIS — Z01810 Encounter for preprocedural cardiovascular examination: Secondary | ICD-10-CM | POA: Diagnosis not present

## 2012-06-15 ENCOUNTER — Other Ambulatory Visit (HOSPITAL_COMMUNITY): Payer: Self-pay | Admitting: Cardiology

## 2012-06-15 DIAGNOSIS — R079 Chest pain, unspecified: Secondary | ICD-10-CM

## 2012-06-15 DIAGNOSIS — G4733 Obstructive sleep apnea (adult) (pediatric): Secondary | ICD-10-CM | POA: Diagnosis not present

## 2012-06-15 DIAGNOSIS — I119 Hypertensive heart disease without heart failure: Secondary | ICD-10-CM | POA: Diagnosis not present

## 2012-06-15 DIAGNOSIS — Z01818 Encounter for other preprocedural examination: Secondary | ICD-10-CM

## 2012-06-15 DIAGNOSIS — I2510 Atherosclerotic heart disease of native coronary artery without angina pectoris: Secondary | ICD-10-CM | POA: Diagnosis not present

## 2012-06-22 ENCOUNTER — Ambulatory Visit (HOSPITAL_COMMUNITY)
Admission: RE | Admit: 2012-06-22 | Discharge: 2012-06-22 | Disposition: A | Discharge status: Home / Self Care | Payer: Medicare Other | Source: Ambulatory Visit | Attending: Cardiology | Admitting: Cardiology

## 2012-06-22 DIAGNOSIS — R079 Chest pain, unspecified: Secondary | ICD-10-CM | POA: Insufficient documentation

## 2012-06-22 DIAGNOSIS — Z01818 Encounter for other preprocedural examination: Secondary | ICD-10-CM

## 2012-06-22 DIAGNOSIS — Z01810 Encounter for preprocedural cardiovascular examination: Secondary | ICD-10-CM | POA: Insufficient documentation

## 2012-06-22 MED ORDER — REGADENOSON 0.4 MG/5ML IV SOLN
0.4000 mg | Freq: Once | INTRAVENOUS | Status: AC
  Administered 2012-06-22: 0.4 mg via INTRAVENOUS

## 2012-06-22 MED ORDER — TECHNETIUM TC 99M SESTAMIBI GENERIC - CARDIOLITE
10.6000 | Freq: Once | INTRAVENOUS | Status: AC | PRN
  Administered 2012-06-22: 11 via INTRAVENOUS

## 2012-06-22 MED ORDER — TECHNETIUM TC 99M SESTAMIBI GENERIC - CARDIOLITE
30.1000 | Freq: Once | INTRAVENOUS | Status: AC | PRN
  Administered 2012-06-22: 30.1 via INTRAVENOUS

## 2012-06-22 NOTE — Procedures (Addendum)
[facility] [address] [place] [address] [id number]  Cardiology Nuclear Med Study  [name] is a 77 y.o. male     MRN : [id number]     DOB: [birth year]  Procedure Date: 06/22/2012  Nuclear Med Background Indication for Stress Test:  Surgical Clearance, Stent Patency, PTCA Patency and   History:  CAD;STENT/PTCA 09/01/2004 Cardiac Risk Factors: Family History - CAD, Hypertension, Lipids and Overweight  Symptoms:  Chest Pain and Fatigue   Nuclear Pre-Procedure Caffeine/Decaff Intake:  1:00am NPO After: 11AM   IV Site: R Antecubital  IV 0.9% NS with Angio Cath:  22g  Chest Size (in):  46 in IV Started by: Azucena Cecil, RN  Height: 5\' 9"  (1.753 m)  Cup Size: n/a  BMI:  Body mass index is 30.85 kg/(m^2). Weight:  209 lb (94.802 kg)   Tech Comments:  N/A    Nuclear Med Study 1 or 2 day study: 1 day  Stress Test Type:  [place] Provider:  Glenetta Hew, MD   Resting Radionuclide: Technetium 65m Sestamibi  Resting Radionuclide Dose: 10.6 mCi   Stress Radionuclide:  Technetium 50m Sestamibi  Stress Radionuclide Dose: 30.1 mCi           Stress Protocol Rest HR: 52 Stress HR: 67  Rest BP: 168/86 Stress BP: 160/73  Exercise Time (min): n/a METS: n/a   Predicted Max HR: 146 bpm % Max HR: 45.89 bpm Rate Pressure Product: 11256  Dose of Adenosine (mg):  n/a Dose of Lexiscan: 0.4 mg  Dose of Atropine (mg): n/a Dose of Dobutamine: n/a mcg/kg/min (at max HR)  Stress Test Technologist: Leane Para, CCT Nuclear Technologist: Imagene Riches, CNMT   Rest Procedure:  Myocardial perfusion imaging was performed at rest 45 minutes following the intravenous administration of Technetium 47m Sestamibi. Stress Procedure:  The patient received IV Lexiscan 0.4 mg over 15-seconds.  Technetium 45m Sestamibi injected at 30-seconds.  There were no significant changes with Lexiscan.  Quantitative spect images  were obtained after a 45 minute delay.  Transient Ischemic Dilatation (Normal <1.22):  0.98 Lung/Heart Ratio (Normal <0.45):  0.30 QGS EDV:  104 ml QGS ESV:  47 ml LV Ejection Fraction: 55%  Rest ECG: Sinus bradycardia  Stress ECG: No significant change from baseline ECG  QPS Raw Data Images:  Mild diaphragmatic attenuation.  Normal left ventricular size. Stress Images:  There is decreased uptake in the inferior wall. Rest Images:  There is decreased uptake in the inferior wall. Subtraction (SDS):  No evidence of ischemia.  Impression Exercise Capacity:  Lexiscan with no exercise. BP Response:  Normal blood pressure response. Clinical Symptoms:  No significant symptoms noted. ECG Impression:  No significant ECG changes with Lexiscan. Comparison with Prior Nuclear Study: No significant change from previous study in 2013  Overall Impression:  Low risk stress nuclear study. Mild basal inferior gut attenuation artifact.  LV Wall Motion:  NL LV Function; NL Wall Motion; EF 55%  Pixie Casino, MD, [facility] Board Certified in Nuclear Cardiology Attending Cardiologist [place], MD  06/22/2012 4:57 PM

## 2012-06-25 DIAGNOSIS — A270 Leptospirosis icterohemorrhagica: Secondary | ICD-10-CM | POA: Diagnosis not present

## 2012-06-26 DIAGNOSIS — Z01810 Encounter for preprocedural cardiovascular examination: Secondary | ICD-10-CM | POA: Diagnosis not present

## 2012-06-26 DIAGNOSIS — I2510 Atherosclerotic heart disease of native coronary artery without angina pectoris: Secondary | ICD-10-CM | POA: Diagnosis not present

## 2012-06-26 DIAGNOSIS — I10 Essential (primary) hypertension: Secondary | ICD-10-CM | POA: Diagnosis not present

## 2012-06-26 DIAGNOSIS — Z9861 Coronary angioplasty status: Secondary | ICD-10-CM | POA: Diagnosis not present

## 2012-06-27 ENCOUNTER — Ambulatory Visit
Admission: RE | Admit: 2012-06-27 | Discharge: 2012-06-27 | Disposition: A | Discharge status: Home / Self Care | Payer: Medicare Other | Source: Ambulatory Visit | Attending: Neurosurgery | Admitting: Neurosurgery

## 2012-06-27 ENCOUNTER — Other Ambulatory Visit: Payer: Self-pay | Admitting: Neurosurgery

## 2012-06-27 DIAGNOSIS — J90 Pleural effusion, not elsewhere classified: Secondary | ICD-10-CM | POA: Diagnosis not present

## 2012-06-27 DIAGNOSIS — Z01818 Encounter for other preprocedural examination: Secondary | ICD-10-CM | POA: Diagnosis not present

## 2012-06-29 ENCOUNTER — Encounter (HOSPITAL_COMMUNITY): Payer: Self-pay | Admitting: Pharmacy Technician

## 2012-07-03 DIAGNOSIS — M48061 Spinal stenosis, lumbar region without neurogenic claudication: Secondary | ICD-10-CM | POA: Diagnosis not present

## 2012-07-05 NOTE — Pre-Procedure Instructions (Addendum)
HARVEER EDGERSON  07/05/2012   Your procedure is scheduled on:  Friday, April 11th.  Report to [facility] at 5:30AM.  Call this number if you have problems the morning of surgery: [phone]   Remember:   Do not eat food or drink liquids after midnight.    Take these medicines the morning of surgery with A SIP OF WATER:           STOP aspirin, any nsaids, herbal meds, plavix per dr   Lazaro Arms not wear jewelry, make-up or nail polish.  Do not wear lotions, powders, or perfumes. You may wear deodorant.   Men may shave face and neck.  Do not bring valuables to the hospital.  Contacts, dentures or bridgework may not be worn into surgery.  Leave suitcase in the car. After surgery it may be brought to your room.  For patients admitted to the hospital, checkout time is 11:00 AM the day of discharge.     Special Instructions: Shower using CHG 2 nights before surgery and the night before surgery.  If you shower the day of surgery use CHG.  Use special wash - you have one bottle of CHG for all showers.  You should use approximately 1/3 of the bottle for each shower.   Please read over the following fact sheets that you were given: Pain Booklet, Coughing and Deep Breathing and Surgical Site Infection Prevention

## 2012-07-06 ENCOUNTER — Encounter (HOSPITAL_COMMUNITY): Payer: Self-pay

## 2012-07-06 ENCOUNTER — Encounter (HOSPITAL_COMMUNITY)
Admission: RE | Admit: 2012-07-06 | Discharge: 2012-07-06 | Disposition: A | Discharge status: Home / Self Care | Payer: Medicare Other | Source: Ambulatory Visit | Attending: Neurosurgery | Admitting: Neurosurgery

## 2012-07-06 DIAGNOSIS — D497 Neoplasm of unspecified behavior of endocrine glands and other parts of nervous system: Secondary | ICD-10-CM | POA: Diagnosis not present

## 2012-07-06 DIAGNOSIS — M47814 Spondylosis without myelopathy or radiculopathy, thoracic region: Secondary | ICD-10-CM | POA: Diagnosis not present

## 2012-07-06 DIAGNOSIS — I10 Essential (primary) hypertension: Secondary | ICD-10-CM | POA: Diagnosis not present

## 2012-07-06 DIAGNOSIS — D432 Neoplasm of uncertain behavior of brain, unspecified: Secondary | ICD-10-CM | POA: Diagnosis not present

## 2012-07-06 DIAGNOSIS — C412 Malignant neoplasm of vertebral column: Secondary | ICD-10-CM | POA: Diagnosis not present

## 2012-07-06 DIAGNOSIS — C720 Malignant neoplasm of spinal cord: Secondary | ICD-10-CM | POA: Diagnosis not present

## 2012-07-06 DIAGNOSIS — D429 Neoplasm of uncertain behavior of meninges, unspecified: Secondary | ICD-10-CM | POA: Diagnosis not present

## 2012-07-06 DIAGNOSIS — Z01812 Encounter for preprocedural laboratory examination: Secondary | ICD-10-CM | POA: Diagnosis not present

## 2012-07-06 DIAGNOSIS — Z23 Encounter for immunization: Secondary | ICD-10-CM | POA: Diagnosis not present

## 2012-07-06 DIAGNOSIS — M4714 Other spondylosis with myelopathy, thoracic region: Secondary | ICD-10-CM | POA: Diagnosis not present

## 2012-07-06 DIAGNOSIS — IMO0002 Reserved for concepts with insufficient information to code with codable children: Secondary | ICD-10-CM | POA: Diagnosis not present

## 2012-07-06 DIAGNOSIS — G4730 Sleep apnea, unspecified: Secondary | ICD-10-CM | POA: Diagnosis not present

## 2012-07-06 DIAGNOSIS — I2510 Atherosclerotic heart disease of native coronary artery without angina pectoris: Secondary | ICD-10-CM | POA: Diagnosis present

## 2012-07-06 DIAGNOSIS — D334 Benign neoplasm of spinal cord: Secondary | ICD-10-CM | POA: Diagnosis not present

## 2012-07-06 HISTORY — DX: Zoster without complications: B02.9

## 2012-07-06 HISTORY — DX: Depression, unspecified: F32.A

## 2012-07-06 HISTORY — DX: Unspecified osteoarthritis, unspecified site: M19.90

## 2012-07-06 HISTORY — DX: Major depressive disorder, single episode, unspecified: F32.9

## 2012-07-06 LAB — CBC
HCT: 38.5 % — ABNORMAL LOW (ref 39.0–52.0)
Hemoglobin: 13.2 g/dL (ref 13.0–17.0)
MCH: 28.4 pg (ref 26.0–34.0)
MCHC: 34.3 g/dL (ref 30.0–36.0)
MCV: 83 fL (ref 78.0–100.0)
Platelets: 172 10*3/uL (ref 150–400)
RBC: 4.64 MIL/uL (ref 4.22–5.81)
RDW: 14.1 % (ref 11.5–15.5)
WBC: 6.9 10*3/uL (ref 4.0–10.5)

## 2012-07-06 LAB — BASIC METABOLIC PANEL
BUN: 18 mg/dL (ref 6–23)
CO2: 28 mEq/L (ref 19–32)
Calcium: 8.8 mg/dL (ref 8.4–10.5)
Chloride: 107 mEq/L (ref 96–112)
Creatinine, Ser: 1 mg/dL (ref 0.50–1.35)
GFR calc Af Amer: 83 mL/min — ABNORMAL LOW (ref >90)
GFR calc non Af Amer: 72 mL/min — ABNORMAL LOW (ref >90)
Glucose, Bld: 122 mg/dL — ABNORMAL HIGH (ref 70–99)
Potassium: 3.7 mEq/L (ref 3.5–5.1)
Sodium: 143 mEq/L (ref 135–145)

## 2012-07-06 LAB — SURGICAL PCR SCREEN
MRSA, PCR: NEGATIVE
Staphylococcus aureus: POSITIVE — AB

## 2012-07-06 LAB — ABO/RH: ABO/RH(D): A POS

## 2012-07-06 LAB — TYPE AND SCREEN
ABO/RH(D): A POS
Antibody Screen: NEGATIVE

## 2012-07-09 NOTE — Progress Notes (Signed)
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 Vertell Limber on 07/13/12.  History includes HTN, OSA with CPAP use, CAD s/p PCI/Cypher stents mid CX '06, HTN, HLD, prostate cancer s/p seed implant '04, melanoma, arthritis, gout, impaired hearing, depression, left hydrocelectomy 10/2011.  Cardiologist is Dr. Ellyn Hack who cleared patient with low cardiovascular risk.  Nuclear stress test on 06/22/12 showed: Low risk stress nuclear study. Mild basal inferior gut attenuation artifact. LV Wall Motion: NL LV Function; NL Wall Motion; EF 55%  Echo on 06/08/09 showed mild concentric LVH, EF greater than 55%, normal LV systolic function, Doppler findings suggestive of impaired LV relaxation, LA is mildly dilated, trace mitral and pulmonic regurgitation, mild tricuspid regurgitation, RVSP is normal.  EKG on 06/14/12 [facility]) showed NSR.  Chest x-ray on 03/12/2012 showed no evidence of active pulmonary disease.  Preoperative labs noted.  Patient has been cleared by his cardiologist.  Pre-operative testing results appear acceptable.  Anticipate he can proceed as planned.  He will be evaluated by his assigned anesthesiologist on the day of surgery.  George Hugh [facility] Short Stay Center/Anesthesiology Phone [phone] 07/09/2012 2:18 PM

## 2012-07-12 MED ORDER — CEFAZOLIN SODIUM-DEXTROSE 2-3 GM-% IV SOLR
2.0000 g | INTRAVENOUS | Status: AC
  Administered 2012-07-13: 2 g via INTRAVENOUS
  Filled 2012-07-12: qty 50

## 2012-07-13 ENCOUNTER — Inpatient Hospital Stay (HOSPITAL_COMMUNITY): Payer: Medicare Other

## 2012-07-13 ENCOUNTER — Inpatient Hospital Stay (HOSPITAL_COMMUNITY): Payer: Medicare Other | Admitting: Anesthesiology

## 2012-07-13 ENCOUNTER — Encounter (HOSPITAL_COMMUNITY)
Admission: RE | Disposition: A | Discharge status: Home Health | Payer: Self-pay | Source: Ambulatory Visit | Attending: Neurosurgery

## 2012-07-13 ENCOUNTER — Encounter (HOSPITAL_COMMUNITY): Payer: Self-pay | Admitting: *Deleted

## 2012-07-13 ENCOUNTER — Inpatient Hospital Stay (HOSPITAL_COMMUNITY)
Admission: RE | Admit: 2012-07-13 | Discharge: 2012-07-17 | DRG: 029 | Disposition: A | Discharge status: Home Health | Payer: Medicare Other | Source: Ambulatory Visit | Attending: Neurosurgery | Admitting: Neurosurgery

## 2012-07-13 ENCOUNTER — Encounter (HOSPITAL_COMMUNITY): Payer: Self-pay | Admitting: Vascular Surgery

## 2012-07-13 DIAGNOSIS — Z01812 Encounter for preprocedural laboratory examination: Secondary | ICD-10-CM

## 2012-07-13 DIAGNOSIS — I10 Essential (primary) hypertension: Secondary | ICD-10-CM | POA: Diagnosis present

## 2012-07-13 DIAGNOSIS — D334 Benign neoplasm of spinal cord: Principal | ICD-10-CM | POA: Diagnosis present

## 2012-07-13 DIAGNOSIS — M4714 Other spondylosis with myelopathy, thoracic region: Secondary | ICD-10-CM | POA: Diagnosis present

## 2012-07-13 DIAGNOSIS — G4730 Sleep apnea, unspecified: Secondary | ICD-10-CM | POA: Diagnosis present

## 2012-07-13 DIAGNOSIS — Z23 Encounter for immunization: Secondary | ICD-10-CM

## 2012-07-13 DIAGNOSIS — I2510 Atherosclerotic heart disease of native coronary artery without angina pectoris: Secondary | ICD-10-CM | POA: Diagnosis present

## 2012-07-13 HISTORY — PX: LAMINECTOMY: SHX219

## 2012-07-13 SURGERY — THORACIC LAMINECTOMY FOR TUMOR
Anesthesia: General | Site: Back | Wound class: Clean

## 2012-07-13 MED ORDER — OXYCODONE-ACETAMINOPHEN 5-325 MG PO TABS
1.0000 | ORAL_TABLET | ORAL | Status: DC | PRN
  Administered 2012-07-14 – 2012-07-15 (×4): 2 via ORAL
  Filled 2012-07-13 (×5): qty 2

## 2012-07-13 MED ORDER — ACETAMINOPHEN 500 MG PO TABS
1000.0000 mg | ORAL_TABLET | Freq: Four times a day (QID) | ORAL | Status: DC | PRN
  Filled 2012-07-13: qty 2

## 2012-07-13 MED ORDER — DEXAMETHASONE 4 MG PO TABS
4.0000 mg | ORAL_TABLET | Freq: Four times a day (QID) | ORAL | Status: DC
  Administered 2012-07-13 – 2012-07-17 (×12): 4 mg via ORAL
  Filled 2012-07-13 (×17): qty 1

## 2012-07-13 MED ORDER — HYDROMORPHONE HCL PF 1 MG/ML IJ SOLN: 0.2500 mg | INTRAMUSCULAR | Status: DC | PRN

## 2012-07-13 MED ORDER — HYDROCODONE-ACETAMINOPHEN 5-325 MG PO TABS
1.0000 | ORAL_TABLET | ORAL | Status: DC | PRN
  Administered 2012-07-13: 2 via ORAL
  Filled 2012-07-13: qty 2

## 2012-07-13 MED ORDER — FLEET ENEMA 7-19 GM/118ML RE ENEM
1.0000 | ENEMA | Freq: Once | RECTAL | Status: AC | PRN
Start: 2012-07-13 — End: 2012-07-13
  Filled 2012-07-13: qty 1

## 2012-07-13 MED ORDER — LIDOCAINE HCL 4 % MT SOLN
OROMUCOSAL | Status: DC | PRN
  Administered 2012-07-13: 4 mL via TOPICAL

## 2012-07-13 MED ORDER — PHENOL 1.4 % MT LIQD: 1.0000 | OROMUCOSAL | Status: DC | PRN

## 2012-07-13 MED ORDER — MENTHOL 3 MG MT LOZG: 1.0000 | LOZENGE | OROMUCOSAL | Status: DC | PRN

## 2012-07-13 MED ORDER — FENTANYL CITRATE 0.05 MG/ML IJ SOLN
INTRAMUSCULAR | Status: DC | PRN
  Administered 2012-07-13: 150 ug via INTRAVENOUS
  Administered 2012-07-13 (×4): 50 ug via INTRAVENOUS
  Administered 2012-07-13 (×2): 150 ug via INTRAVENOUS

## 2012-07-13 MED ORDER — PANTOPRAZOLE SODIUM 40 MG IV SOLR
40.0000 mg | Freq: Every day | INTRAVENOUS | Status: DC
  Administered 2012-07-13 – 2012-07-14 (×2): 40 mg via INTRAVENOUS
  Filled 2012-07-13 (×3): qty 40

## 2012-07-13 MED ORDER — SODIUM CHLORIDE 0.9 % IJ SOLN: 3.0000 mL | Freq: Two times a day (BID) | INTRAMUSCULAR | Status: DC

## 2012-07-13 MED ORDER — EPHEDRINE SULFATE 50 MG/ML IJ SOLN
INTRAMUSCULAR | Status: DC | PRN
Start: 2012-07-13 — End: 2012-07-13
  Administered 2012-07-13 (×2): 10 mg via INTRAVENOUS
  Administered 2012-07-13: 5 mg via INTRAVENOUS

## 2012-07-13 MED ORDER — IRBESARTAN 75 MG PO TABS
75.0000 mg | ORAL_TABLET | Freq: Every day | ORAL | Status: DC
  Administered 2012-07-13 – 2012-07-17 (×5): 75 mg via ORAL
  Filled 2012-07-13 (×5): qty 1

## 2012-07-13 MED ORDER — NALOXONE HCL 0.4 MG/ML IJ SOLN
INTRAMUSCULAR | Status: AC
  Filled 2012-07-13: qty 1

## 2012-07-13 MED ORDER — PROPOFOL 10 MG/ML IV BOLUS
INTRAVENOUS | Status: DC | PRN
  Administered 2012-07-13: 100 mg via INTRAVENOUS

## 2012-07-13 MED ORDER — HYDROMORPHONE HCL PF 1 MG/ML IJ SOLN
0.2500 mg | INTRAMUSCULAR | Status: DC | PRN
  Administered 2012-07-13 (×2): 0.5 mg via INTRAVENOUS

## 2012-07-13 MED ORDER — LIDOCAINE HCL (CARDIAC) 20 MG/ML IV SOLN
INTRAVENOUS | Status: DC | PRN
  Administered 2012-07-13: 100 mg via INTRAVENOUS

## 2012-07-13 MED ORDER — ONDANSETRON HCL 4 MG/2ML IJ SOLN: 4.0000 mg | Freq: Once | INTRAMUSCULAR | Status: DC | PRN

## 2012-07-13 MED ORDER — MIDAZOLAM HCL 5 MG/5ML IJ SOLN: INTRAMUSCULAR | Status: DC | PRN

## 2012-07-13 MED ORDER — SUCCINYLCHOLINE CHLORIDE 20 MG/ML IJ SOLN
INTRAMUSCULAR | Status: DC | PRN
  Administered 2012-07-13: 10 mg via INTRAVENOUS

## 2012-07-13 MED ORDER — OXYBUTYNIN CHLORIDE ER 15 MG PO TB24
15.0000 mg | ORAL_TABLET | Freq: Every day | ORAL | Status: DC
  Administered 2012-07-13 – 2012-07-16 (×4): 15 mg via ORAL
  Filled 2012-07-13 (×5): qty 1

## 2012-07-13 MED ORDER — 0.9 % SODIUM CHLORIDE (POUR BTL) OPTIME
TOPICAL | Status: DC | PRN
  Administered 2012-07-13 (×2): 1000 mL

## 2012-07-13 MED ORDER — DEXAMETHASONE SODIUM PHOSPHATE 10 MG/ML IJ SOLN
INTRAMUSCULAR | Status: DC | PRN
  Administered 2012-07-13: 20 mg via INTRAVENOUS

## 2012-07-13 MED ORDER — ALLOPURINOL 300 MG PO TABS
300.0000 mg | ORAL_TABLET | Freq: Every day | ORAL | Status: DC
  Administered 2012-07-13 – 2012-07-17 (×5): 300 mg via ORAL
  Filled 2012-07-13 (×5): qty 1

## 2012-07-13 MED ORDER — MORPHINE SULFATE 2 MG/ML IJ SOLN
1.0000 mg | INTRAMUSCULAR | Status: DC | PRN
  Administered 2012-07-13: 2 mg via INTRAVENOUS
  Administered 2012-07-13 (×2): 1 mg via INTRAVENOUS
  Administered 2012-07-13: 2 mg via INTRAVENOUS
  Administered 2012-07-14: 4 mg via INTRAVENOUS
  Administered 2012-07-14: 2 mg via INTRAVENOUS
  Filled 2012-07-13 (×4): qty 1
  Filled 2012-07-13: qty 2

## 2012-07-13 MED ORDER — ACETAMINOPHEN 325 MG PO TABS: 650.0000 mg | ORAL_TABLET | ORAL | Status: DC | PRN

## 2012-07-13 MED ORDER — ONDANSETRON HCL 4 MG/2ML IJ SOLN
INTRAMUSCULAR | Status: DC | PRN
  Administered 2012-07-13: 4 mg via INTRAVENOUS

## 2012-07-13 MED ORDER — DOCUSATE SODIUM 100 MG PO CAPS
100.0000 mg | ORAL_CAPSULE | Freq: Two times a day (BID) | ORAL | Status: DC
  Administered 2012-07-13 – 2012-07-17 (×8): 100 mg via ORAL
  Filled 2012-07-13 (×9): qty 1

## 2012-07-13 MED ORDER — ARTIFICIAL TEARS OP OINT
TOPICAL_OINTMENT | OPHTHALMIC | Status: DC | PRN
  Administered 2012-07-13: 1 via OPHTHALMIC

## 2012-07-13 MED ORDER — CEFAZOLIN SODIUM 1-5 GM-% IV SOLN
1.0000 g | Freq: Three times a day (TID) | INTRAVENOUS | Status: AC
  Administered 2012-07-13 (×2): 1 g via INTRAVENOUS
  Filled 2012-07-13 (×2): qty 50

## 2012-07-13 MED ORDER — ESCITALOPRAM OXALATE 10 MG PO TABS
10.0000 mg | ORAL_TABLET | Freq: Every day | ORAL | Status: DC
  Administered 2012-07-13 – 2012-07-17 (×5): 10 mg via ORAL
  Filled 2012-07-13 (×5): qty 1

## 2012-07-13 MED ORDER — SENNOSIDES-DOCUSATE SODIUM 8.6-50 MG PO TABS
1.0000 | ORAL_TABLET | Freq: Every evening | ORAL | Status: DC | PRN
  Filled 2012-07-13: qty 1

## 2012-07-13 MED ORDER — PNEUMOCOCCAL VAC POLYVALENT 25 MCG/0.5ML IJ INJ
0.5000 mL | INJECTION | INTRAMUSCULAR | Status: AC
  Administered 2012-07-14: 0.5 mL via INTRAMUSCULAR
  Filled 2012-07-13: qty 0.5

## 2012-07-13 MED ORDER — TAMSULOSIN HCL 0.4 MG PO CAPS
0.4000 mg | ORAL_CAPSULE | Freq: Every day | ORAL | Status: DC
  Administered 2012-07-13 – 2012-07-16 (×4): 0.4 mg via ORAL
  Filled 2012-07-13 (×5): qty 1

## 2012-07-13 MED ORDER — HYDROMORPHONE HCL PF 1 MG/ML IJ SOLN
INTRAMUSCULAR | Status: AC
  Filled 2012-07-13: qty 1

## 2012-07-13 MED ORDER — ASPIRIN EC 81 MG PO TBEC
81.0000 mg | DELAYED_RELEASE_TABLET | Freq: Every day | ORAL | Status: DC
  Administered 2012-07-13 – 2012-07-17 (×5): 81 mg via ORAL
  Filled 2012-07-13 (×5): qty 1

## 2012-07-13 MED ORDER — SODIUM CHLORIDE 0.9 % IV SOLN: 250.0000 mL | INTRAVENOUS | Status: DC

## 2012-07-13 MED ORDER — GLYCOPYRROLATE 0.2 MG/ML IJ SOLN
INTRAMUSCULAR | Status: DC | PRN
Start: 2012-07-13 — End: 2012-07-13
  Administered 2012-07-13: 0.4 mg via INTRAVENOUS
  Administered 2012-07-13: 0.2 mg via INTRAVENOUS

## 2012-07-13 MED ORDER — ATORVASTATIN CALCIUM 20 MG PO TABS
20.0000 mg | ORAL_TABLET | Freq: Every day | ORAL | Status: DC
  Administered 2012-07-13 – 2012-07-16 (×4): 20 mg via ORAL
  Filled 2012-07-13 (×5): qty 1

## 2012-07-13 MED ORDER — BUPIVACAINE HCL (PF) 0.5 % IJ SOLN
INTRAMUSCULAR | Status: DC | PRN
  Administered 2012-07-13: 10 mL

## 2012-07-13 MED ORDER — LIDOCAINE-EPINEPHRINE 1 %-1:100000 IJ SOLN
INTRAMUSCULAR | Status: DC | PRN
  Administered 2012-07-13: 10 mL

## 2012-07-13 MED ORDER — AMLODIPINE BESYLATE 10 MG PO TABS
10.0000 mg | ORAL_TABLET | Freq: Every day | ORAL | Status: DC
  Administered 2012-07-13 – 2012-07-17 (×5): 10 mg via ORAL
  Filled 2012-07-13 (×5): qty 1

## 2012-07-13 MED ORDER — BISACODYL 10 MG RE SUPP: 10.0000 mg | Freq: Every day | RECTAL | Status: DC | PRN

## 2012-07-13 MED ORDER — LACTATED RINGERS IV SOLN
INTRAVENOUS | Status: DC | PRN
  Administered 2012-07-13 (×3): via INTRAVENOUS

## 2012-07-13 MED ORDER — DEXTROSE 5 % IV SOLN
INTRAVENOUS | Status: DC | PRN
  Administered 2012-07-13: 08:00:00 via INTRAVENOUS

## 2012-07-13 MED ORDER — THROMBIN 20000 UNITS EX SOLR
CUTANEOUS | Status: DC | PRN
  Administered 2012-07-13: 09:00:00 via TOPICAL

## 2012-07-13 MED ORDER — ONDANSETRON HCL 4 MG/2ML IJ SOLN: 4.0000 mg | INTRAMUSCULAR | Status: DC | PRN

## 2012-07-13 MED ORDER — LACTATED RINGERS IV SOLN
INTRAVENOUS | Status: DC | PRN
  Administered 2012-07-13 (×3): via INTRAVENOUS

## 2012-07-13 MED ORDER — SENNA 8.6 MG PO TABS
1.0000 | ORAL_TABLET | Freq: Two times a day (BID) | ORAL | Status: DC
  Administered 2012-07-13 – 2012-07-17 (×8): 8.6 mg via ORAL
  Filled 2012-07-13 (×10): qty 1

## 2012-07-13 MED ORDER — PROPOFOL INFUSION 10 MG/ML OPTIME
INTRAVENOUS | Status: DC | PRN
  Administered 2012-07-13: 140 ug/kg/min via INTRAVENOUS

## 2012-07-13 MED ORDER — ATORVASTATIN CALCIUM 10 MG PO TABS: 10.0000 mg | ORAL_TABLET | Freq: Every day | ORAL | Status: DC

## 2012-07-13 MED ORDER — SODIUM CHLORIDE 0.9 % IJ SOLN: 3.0000 mL | INTRAMUSCULAR | Status: DC | PRN

## 2012-07-13 MED ORDER — KCL IN DEXTROSE-NACL 20-5-0.45 MEQ/L-%-% IV SOLN
INTRAVENOUS | Status: DC
  Administered 2012-07-13: 15:00:00 via INTRAVENOUS
  Filled 2012-07-13 (×11): qty 1000

## 2012-07-13 MED ORDER — MICROFIBRILLAR COLL HEMOSTAT EX PADS
MEDICATED_PAD | CUTANEOUS | Status: DC | PRN
  Administered 2012-07-13: 1 via TOPICAL

## 2012-07-13 MED ORDER — ACETAMINOPHEN 650 MG RE SUPP: 650.0000 mg | RECTAL | Status: DC | PRN

## 2012-07-13 MED ORDER — HYDROCODONE-ACETAMINOPHEN 2.5-325 MG PO TABS: 1.0000 | ORAL_TABLET | ORAL | Status: DC

## 2012-07-13 MED ORDER — DEXAMETHASONE SODIUM PHOSPHATE 4 MG/ML IJ SOLN
4.0000 mg | Freq: Four times a day (QID) | INTRAMUSCULAR | Status: DC
  Administered 2012-07-13 – 2012-07-15 (×4): 4 mg via INTRAVENOUS
  Filled 2012-07-13 (×19): qty 1

## 2012-07-13 SURGICAL SUPPLY — 79 items
APL SKNCLS STERI-STRIP NONHPOA (GAUZE/BANDAGES/DRESSINGS) ×1
BAG DECANTER FOR FLEXI CONT (MISCELLANEOUS) ×2 IMPLANT
BENZOIN TINCTURE PRP APPL 2/3 (GAUZE/BANDAGES/DRESSINGS) ×1 IMPLANT
BLADE SURG 11 STRL SS (BLADE) ×2 IMPLANT
BLADE SURG ROTATE 9660 (MISCELLANEOUS) IMPLANT
BLADE ULTRA TIP 2M (BLADE) IMPLANT
BUR MATCHSTICK NEURO 3.0 LAGG (BURR) IMPLANT
BUR ROUND FLUTED 5 RND (BURR) ×1 IMPLANT
CANISTER SUCTION 2500CC (MISCELLANEOUS) ×2 IMPLANT
CHLORAPREP W/TINT 26ML (MISCELLANEOUS) ×1 IMPLANT
CLIP TI WIDE RED SMALL 6 (CLIP) ×1 IMPLANT
CLOTH BEACON ORANGE TIMEOUT ST (SAFETY) ×2 IMPLANT
CONT SPEC 4OZ CLIKSEAL STRL BL (MISCELLANEOUS) ×4 IMPLANT
DRAPE LAPAROTOMY 100X72 PEDS (DRAPES) ×2 IMPLANT
DRAPE LAPAROTOMY 100X72X124 (DRAPES) IMPLANT
DRAPE MICROSCOPE LEICA (MISCELLANEOUS) ×2 IMPLANT
DRAPE ORTHO SPLIT 77X108 STRL (DRAPES)
DRAPE POUCH INSTRU U-SHP 10X18 (DRAPES) ×2 IMPLANT
DRAPE SURG ORHT 6 SPLT 77X108 (DRAPES) IMPLANT
DRESSING TELFA 8X3 (GAUZE/BANDAGES/DRESSINGS) ×2 IMPLANT
ELECT REM PT RETURN 9FT ADLT (ELECTROSURGICAL) ×2
ELECTRODE REM PT RTRN 9FT ADLT (ELECTROSURGICAL) ×1 IMPLANT
GAUZE SPONGE 4X4 16PLY XRAY LF (GAUZE/BANDAGES/DRESSINGS) ×1 IMPLANT
GLOVE BIO SURGEON STRL SZ8 (GLOVE) ×2 IMPLANT
GLOVE BIOGEL PI IND STRL 8 (GLOVE) ×1 IMPLANT
GLOVE BIOGEL PI IND STRL 8.5 (GLOVE) ×1 IMPLANT
GLOVE BIOGEL PI INDICATOR 8 (GLOVE) ×2
GLOVE BIOGEL PI INDICATOR 8.5 (GLOVE) ×1
GLOVE ECLIPSE 6.5 STRL STRAW (GLOVE) ×2 IMPLANT
GLOVE ECLIPSE 7.5 STRL STRAW (GLOVE) ×4 IMPLANT
GLOVE ECLIPSE 8.0 STRL XLNG CF (GLOVE) ×1 IMPLANT
GLOVE EXAM NITRILE LRG STRL (GLOVE) IMPLANT
GLOVE EXAM NITRILE MD LF STRL (GLOVE) IMPLANT
GLOVE EXAM NITRILE XL STR (GLOVE) IMPLANT
GLOVE EXAM NITRILE XS STR PU (GLOVE) IMPLANT
GOWN BRE IMP SLV AUR LG STRL (GOWN DISPOSABLE) ×1 IMPLANT
GOWN BRE IMP SLV AUR XL STRL (GOWN DISPOSABLE) ×1 IMPLANT
GOWN STRL REIN 2XL LVL4 (GOWN DISPOSABLE) ×2 IMPLANT
HEMOSTAT SURGICEL 2X14 (HEMOSTASIS) IMPLANT
KIT BASIN OR (CUSTOM PROCEDURE TRAY) ×2 IMPLANT
KIT ROOM TURNOVER OR (KITS) ×2 IMPLANT
KNIFE ARACHNOID DISP AM-21-S (BLADE) ×2 IMPLANT
MARKER SKIN DUAL TIP RULER LAB (MISCELLANEOUS) ×2 IMPLANT
NDL HYPO 25X1 1.5 SAFETY (NEEDLE) ×1 IMPLANT
NDL SPNL 18GX3.5 QUINCKE PK (NEEDLE) IMPLANT
NDL SPNL 22GX3.5 QUINCKE BK (NEEDLE) ×1 IMPLANT
NEEDLE HYPO 25X1 1.5 SAFETY (NEEDLE) ×2 IMPLANT
NEEDLE SPNL 18GX3.5 QUINCKE PK (NEEDLE) ×6 IMPLANT
NEEDLE SPNL 22GX3.5 QUINCKE BK (NEEDLE) IMPLANT
NS IRRIG 1000ML POUR BTL (IV SOLUTION) ×2 IMPLANT
PACK LAMINECTOMY NEURO (CUSTOM PROCEDURE TRAY) ×2 IMPLANT
PATTIES SURGICAL .25X.25 (GAUZE/BANDAGES/DRESSINGS) IMPLANT
PATTIES SURGICAL .5 X.5 (GAUZE/BANDAGES/DRESSINGS) IMPLANT
PATTIES SURGICAL .5 X3 (DISPOSABLE) IMPLANT
RUBBERBAND STERILE (MISCELLANEOUS) ×4 IMPLANT
SLEEVE SURGEON STRL (DRAPES) ×1 IMPLANT
SPONGE GAUZE 4X4 12PLY (GAUZE/BANDAGES/DRESSINGS) ×2 IMPLANT
SPONGE LAP 4X18 X RAY DECT (DISPOSABLE) IMPLANT
SPONGE NEURO XRAY DETECT 1X3 (DISPOSABLE) IMPLANT
SPONGE SURGIFOAM ABS GEL 100 (HEMOSTASIS) IMPLANT
STAPLER SKIN PROX WIDE 3.9 (STAPLE) ×2 IMPLANT
STRIP CLOSURE SKIN 1/2X4 (GAUZE/BANDAGES/DRESSINGS) IMPLANT
SUT NURALON 4 0 TR CR/8 (SUTURE) IMPLANT
SUT PROLENE 6 0 BV (SUTURE) ×1 IMPLANT
SUT SILK 6 0 BV 1XDISCX (SUTURE) ×1 IMPLANT
SUT VIC AB 0 CT1 18XCR BRD8 (SUTURE) ×1 IMPLANT
SUT VIC AB 0 CT1 8-18 (SUTURE) ×2
SUT VIC AB 2-0 CT1 18 (SUTURE) ×2 IMPLANT
SUT VIC AB 3-0 SH 8-18 (SUTURE) ×2 IMPLANT
SYR 20ML ECCENTRIC (SYRINGE) ×2 IMPLANT
TAPE CLOTH SURG 4X10 WHT LF (GAUZE/BANDAGES/DRESSINGS) ×1 IMPLANT
TIP NONSTICK .5MMX23CM (INSTRUMENTS) ×2
TIP NONSTICK .5X23 (INSTRUMENTS) IMPLANT
TIP SONASTAR STD MISONIX 1.9 (TRAY / TRAY PROCEDURE) IMPLANT
TOWEL OR 17X24 6PK STRL BLUE (TOWEL DISPOSABLE) ×2 IMPLANT
TOWEL OR 17X26 10 PK STRL BLUE (TOWEL DISPOSABLE) ×2 IMPLANT
TRAY FOLEY CATH 14FRSI W/METER (CATHETERS) IMPLANT
UNDERPAD 30X30 INCONTINENT (UNDERPADS AND DIAPERS) ×2 IMPLANT
WATER STERILE IRR 1000ML POUR (IV SOLUTION) ×2 IMPLANT

## 2012-07-13 NOTE — Progress Notes (Signed)
Arrived to PACU, sedated, needed to be hooked to CPAP. Dr. Vertell Limber at bedside.

## 2012-07-13 NOTE — Transfer of Care (Signed)
Immediate Anesthesia Transfer of Care Note  Patient: [name]  Procedure(s) Performed: Procedure(s) with comments: THORACIC LAMINECTOMY FOR RESECTION OF INTRAMEDULLARY SPINAL CHORD TUMOR WITH SPINAL CHORD MONITORING (N/A) - Thoracic laminectomy for resection of intramedullary spinal cord tumor with spinal cord monitering and Dr. Christella Noa to assist  Patient Location: PACU  Anesthesia Type:General  Level of Consciousness: sedated and responds to stimulation  Airway & Oxygen Therapy: Patient connected to face mask oxygen  Post-op Assessment: Report given to PACU RN, Post -op Vital signs reviewed and stable and Patient moving all extremities X 4  Post vital signs: Reviewed and stable  Complications: No apparent anesthesia complications

## 2012-07-13 NOTE — Progress Notes (Signed)
Dr. Vertell Limber, Dr. Tamala Julian, RT and Susie CRNA at bedside for above

## 2012-07-13 NOTE — Progress Notes (Signed)
All charting per Lorie Apley RN

## 2012-07-13 NOTE — Progress Notes (Signed)
Awake, alert, sleepy, but arousable.  Moving all extremities well with good strength.  Doing well.

## 2012-07-13 NOTE — Op Note (Signed)
07/13/2012  11:07 AM  PATIENT:  [name]  77 y.o. male  PRE-OPERATIVE DIAGNOSIS:  Spinal cord tumor, Thoracic spondylosis with myelopathy, Thoracic radiculopathy T 10 - T 11  POST-OPERATIVE DIAGNOSIS:  Spinal cord tumor, Thoracic spondylosis with myelopathy, Thoracic radiculopathy T 10 - T 11  PROCEDURE:  Procedure(s) with comments: THORACIC LAMINECTOMY FOR RESECTION OF INTRAMEDULLARY SPINAL CHORD TUMOR WITH SPINAL CHORD MONITORING (N/A) - Thoracic laminectomy for resection of intramedullary spinal cord tumor with spinal cord monitering and Dr. Christella Noa to assist  SURGEON:  Surgeon(s) and Role:    * Erline Levine, MD - Primary    * Winfield Cunas, MD - Assisting  PHYSICIAN ASSISTANT:   ASSISTANTS: [place], RN   ANESTHESIA:   general  EBL:  Total I/O In: 4050 [I.V.:4050] Out: 750 [Urine:250; Blood:500]  BLOOD ADMINISTERED:none  DRAINS: none   LOCAL MEDICATIONS USED:  LIDOCAINE   SPECIMEN:  Excision  DISPOSITION OF SPECIMEN:  PATHOLOGY  COUNTS:  YES  TOURNIQUET:  * No tourniquets in log *  DICTATION: Patient has spinal cord compression at T10-T11 due to intramedullary cystic mass along with thoracic spondylosis with bilateral leg weakness and numbness, thoracic radiculopathy and intractable pain. It was elected to take him to surgery for T10-11 laminectomy and resection of intramedullary spinal cord tumor and treatment of spondylosis..  Procedure: Patient was brought to the operating room and following the smooth and uncomplicated induction of general endotracheal anesthesia she was placed in a prone position on the Wilson frame. 3, 18-gauge spinal needles were taped to her back and an AP radiograph was obtained to localize the T11 level. SSEP, MEPs and EMG recordings were performed throughout the case. His back was prepped and draped in the usual sterile fashion with ChloraPrep. Area of planned incision was infiltrated with local lidocaine. Incision was made in the  midline and carried to the thoacodorsal fascia which was incised bilaterally.  A total laminectomy of inferior T 9 to the top of T 12  was performed with leksell, and the spinal cord dura was defined. Ultrasound was used to confirm location of intramedullary mass lesion. The dura was opened to expose the spinal cord and the dura and arachnoid was tacked up. A midline myelotomy was performed under microdissection and a cystic cavity in the spinal cord was entered.Fibrinous and discolored material was identified and then removed with a micro-pituitary.  Ultrasound was brought back in and confirmed that we were in the mass.  Additional caudal material was identified and removed.  At this point it was felt that all neural elements were well decompressed and ultrasound confirmed that the mass had been removed. Hemostasis was assured.The dura was closed with a running 6-0 prolene stitch in a water tight fashion. The wound was then irrigated with saline. Hemostasis was assured with gelfoam. The lumbodorsal fascia was closed with 0 Vicryl sutures the subcutaneous tissues reapproximated 2-0 Vicryl inverted sutures and the skin edges were reapproximated with 3-0  Running nylon stitch. The wound is dressed with Benzoin and steristrips. Patient was extubated in the operating room and taken to recovery in stable and satisfactory condition having tolerated his operation well counts were correct at the end of the case. Electrical recordings were preserved throughout the surgery.   PLAN OF CARE: Admit to inpatient   PATIENT DISPOSITION:  PACU - hemodynamically stable.   Delay start of Pharmacological VTE agent (>24hrs) due to surgical blood loss or risk of bleeding: yes

## 2012-07-13 NOTE — Anesthesia Postprocedure Evaluation (Signed)
  Anesthesia Post-op Note  Patient: [name]  Procedure(s) Performed: Procedure(s) with comments: THORACIC LAMINECTOMY FOR RESECTION OF INTRAMEDULLARY SPINAL CHORD TUMOR WITH SPINAL CHORD MONITORING (N/A) - Thoracic laminectomy for resection of intramedullary spinal cord tumor with spinal cord monitering and Dr. Christella Noa to assist  Patient Location: PACU  Anesthesia Type:General  Level of Consciousness: sedated and patient cooperative  Airway and Oxygen Therapy: Patient Spontanous Breathing and Patient connected to face mask oxygen  Post-op Pain: none  Post-op Assessment: Post-op Vital signs reviewed, Patient's Cardiovascular Status Stable, Respiratory Function Stable, Patent Airway, No signs of Nausea or vomiting and Pain level controlled  Post-op Vital Signs: stable  Complications: No apparent anesthesia complications

## 2012-07-13 NOTE — Progress Notes (Signed)
Lunch relief provided for Cheron Schaumann RN. Patient remains arousable but not following commands.

## 2012-07-13 NOTE — H&P (Signed)
[name]   [id number] DOB:  1935-11-24 06/25/2012:  [name] returns today. He brought Dr. Knute Neu with him and along with his wife.  I discussed in great detail the planned surgery, attendant risks and benefits, and to make a decision about whether to proceed or go elsewhere.  I answered their questions and spent approximately 45 minutes in detailed discussion with the patient, his wife, and advocate.    He is having persistent band-like chest wall pain and Dr. Rockwell Alexandria raised the question of whether the right pleural effusion is still an issue. I told him it was difficult to compare with the MRI, but that I thought that getting a new CT scan of his chest to make sure that the pleural effusion was not problematic would make sense, and I will order this. Also, awaiting for an official review of his Myoview and he is going to speak with the cardiologist tomorrow.    He is planning on proceeding with surgery and again the risks and benefits were discussed in great detail, and we went over his imaging studies and rationale for the surgical plan.          [name]. [name], M.D./aft  cc: Dr. Lavone Orn     NEUROSURGICAL CONSULTATION  [name]  [id number] DOB:  01-28-1936  June 11, 2012  HISTORY:     [name] is a 77 year old man who is self-employed and works at [employer] who complains of a band of pain surrounding his chest.  He says this is sensitive to touch across the chest.  He also notes pain in his abdomen, chest and back.  He notes that an occasional sneeze causes severe pain around his back and chest.  He also fractured a rib and separated a cartilage and was seen in the emergency room in severe pain.  This has subsequently improved somewhat.  He also notes fatigue.  He says that this began in December of 2013.  He has been taking Hydrocodone 5/325 1 p.o. q.h.s., Tylenol 500 mg. during the day.  He is on Plavix.    He denies bowel or bladder  dysfunction and denies any lower extremity symptoms other than the numbness in both feet.    REVIEW OF SYSTEMS:   A detailed Review of Systems sheet was reviewed with the patient.  Pertinent positives include: Eyes-glasses; Ears, nose, mouth and throat-hearing aid; Cardiovascular-high blood pressure, high cholesterol; Musculoskeletal-broken bones including ribs and cartilage and back pain; Allergic-food allergies.  All other systems are negative; this includes Constitutional symptoms, Endocrine, Respiratory, Gastrointestinal, Genitourinary, Integumentary & Breast, Neurologic, Psychiatric, Hematologic/Lymphatic, and Immunologic.    PAST MEDICAL HISTORY:      Current Medical Conditions:    As previously described.  He notes a history of melanoma, prostate cancer, hypertension, coronary artery disease, sleep apnea for which he is on CPAP and gout.       Prior Operations and Hospitalizations:   He has a significant history of coronary artery disease and had stents placed in 08/2004, radium seeds for prostate cancer in 11/2002, Mohs surgery for deeply invasive melanoma in 05/2005. He has also had surgery for a hydrocele in 10/2011.      Medications and Allergies:  Allopurinol 300 mg. q.d., Amlodipine 10 m. q.d., Diovan 80 mg. q.d., Oxybutynin 15 mg. q.d., Tamsulosin 0.4 mg. q.d., Lexapro 10 mg. q.d.,   Crestor 20 m. qd.., Plavix 75 mg. q.d. and aspirin 81 mg. q.d.  HE IS ALLERGIC  TO IODINE WHICH CAUSES A SEVERE REACTION.       Height and Weight:     5'9" tall and 203 pounds.        SOCIAL HISTORY:    He denies tobacco or drug use.  He is a social drinker of alcoholic beverages.      DIAGNOSTIC STUDIES:   I have reviewed an MRI of the thoracic spine which demonstrates a significant structural abnormality of the lower thoracic spinal cord.  There is a cystic mass in the spinal cord at T10-T11 with 5 x 10 mm enhancing nodule within the cyst.  Given the history of melanoma, this may represent metastatic disease.   Hemangioblastoma is possible, however, the level enhancement is not as intense as one would expect with hemangioblastoma.  A primary spinal cord tumor is considered less likely.    The findings were discussed with Dr. Laurann Montana.  Coincidentally he has moderate spinal stenosis at T10-11 due to posterior element hypertrophy.    PHYSICAL EXAMINATION:      General Appearance:   On examination today, [name] is a pleasant and cooperative man in no acute distress.        Blood Pressure, Pulse:     130/82, heart rate 74 and regular, respirations 16.     HEENT - normocephalic, atraumatic.  The pupils are equal, round and reactive to light.  The extraocular muscles are intact.  Sclerae - white.  Conjunctiva - pink.  Oropharynx benign.  Uvula midline.     Neck - there are no masses, meningismus, deformities, tracheal deviation, jugular vein distention or carotid bruits.  There is normal cervical range of motion.  Spurlings' test is negative without reproducible radicular pain turning the patient's head to either side.  Lhermitte's sign is not present with axial compression.      Respiratory - there is normal respiratory effort with good intercostal function.  Lungs are clear to auscultation.  There are no rales, rhonchi or wheezes.      Cardiovascular - the heart has regular rate and rhythm to auscultation.  No murmurs are appreciated.  There is no extremity edema, cyanosis or clubbing.  There are palpable pedal pulses.    Angelica A. Bermudez  [id number] DOB:  December 03, 1935   June 11, 2012 Page 3    Abdomen - soft, nontender, no hepatosplenomegaly appreciated or masses.  There are active bowel sounds.  No guarding or rebound.      Musculoskeletal Examination - the patient is able to walk about the examining room with a normal, casual, heel and toe gait.    NEUROLOGICAL EXAMINATION: The patient is oriented to time, person and place and has good recall of both recent and remote memory with normal attention  span and concentration.  The patient speaks with clear and fluent speech and exhibits normal language function and appropriate fund of knowledge.      Cranial Nerve Examination - pupils are equal, round and reactive to light.  Extraocular movements are full.  Visual fields are full to confrontational testing.  Facial sensation and facial movement are symmetric and intact.  Hearing is intact to finger rub.  Palate is upgoing.  Shoulder shrug is symmetric.  Tongue protrudes in the midline.      Motor Examination - motor strength is 5/5 in the bilateral deltoids, biceps, triceps, handgrips, wrist extensors, interosseous.  In the lower extremities motor strength is 5/5 in hip flexion, extension, quadriceps, hamstrings, plantar flexion, dorsiflexion and extensor hallucis longus.  Sensory Examination - he has a band of pain around his rib cage at approximately the level of T10-11. He does note numbness to the bottoms of both feet.     Deep Tendon Reflexes - 2 in the biceps, triceps, and brachioradialis, 2 in the knees, 2 in the ankles.  The great toes are downgoing to plantar stimulation.       Cerebellar Examination - normal coordination in upper and lower extremities and normal rapid alternating movements.  Romberg test is negative.    IMPRESSION AND RECOMMENDATIONS: [name] is a 77 year old man with an enhancing intrinsic spinal cord lesion at the level of T10 through T11.  This appears to be expansile and may represent metastatic disease.  I explained to him that this is quite concerning and that he is at risk for neurologic deterioration because of this lesion and because of that I would be uncomfortable just waiting for him to develop symptoms.    After meeting with the patient, I had Dr. Christella Noa come in and also meet with the patient and his wife.  We went over the issues that this presents.  We do think that he should undergo surgery and that this tumor should be resected and depending on  the results of that resection further treatment could be rendered.  This would involve a thoracic laminectomy from T10 through T11 levels with resection of intramedullary spinal cord tumor.  I explained to him that there is a significant risk of neurologic worsening either before or with surgery but that this risk is extremely high without surgery.  He will need to go to ICU postoperatively.  He will need spinal cord monitoring.  He will need cardiology clearance before surgery and will need to be off Plavix for 10 days prior to surgery.    He wishes to proceed with surgery and this has been scheduled for 07/13/2012.   [facility]    [name]. [name], M.D.  JDS:gde  cc: Dr. Lavone Orn

## 2012-07-13 NOTE — Progress Notes (Signed)
UR COMPLETED  

## 2012-07-13 NOTE — Progress Notes (Signed)
Oral and nasal airway put in by CRNA.

## 2012-07-13 NOTE — Brief Op Note (Signed)
07/13/2012  11:07 AM  PATIENT:  [name]  77 y.o. male  PRE-OPERATIVE DIAGNOSIS:  Spinal cord tumor, Thoracic spondylosis with myelopathy, Thoracic radiculopathy T 10 - T 11  POST-OPERATIVE DIAGNOSIS:  Spinal cord tumor, Thoracic spondylosis with myelopathy, Thoracic radiculopathy T 10 - T 11  PROCEDURE:  Procedure(s) with comments: THORACIC LAMINECTOMY FOR RESECTION OF INTRAMEDULLARY SPINAL CHORD TUMOR WITH SPINAL CHORD MONITORING (N/A) - Thoracic laminectomy for resection of intramedullary spinal cord tumor with spinal cord monitering and Dr. Christella Noa to assist  SURGEON:  Surgeon(s) and Role:    * Erline Levine, MD - Primary    * Winfield Cunas, MD - Assisting  PHYSICIAN ASSISTANT:   ASSISTANTS: Poteat, RN   ANESTHESIA:   general  EBL:  Total I/O In: 4050 [I.V.:4050] Out: 750 [Urine:250; Blood:500]  BLOOD ADMINISTERED:none  DRAINS: none   LOCAL MEDICATIONS USED:  LIDOCAINE   SPECIMEN:  Excision  DISPOSITION OF SPECIMEN:  PATHOLOGY  COUNTS:  YES  TOURNIQUET:  * No tourniquets in log *  DICTATION: Patient has spinal cord compression at T10-T11 due to intramedullary cystic mass along with thoracic spondylosis with bilateral leg weakness and numbness, thoracic radiculopathy and intractable pain. It was elected to take him to surgery for T10-11 laminectomy and resection of intramedullary spinal cord tumor and treatment of spondylosis..  Procedure: Patient was brought to the operating room and following the smooth and uncomplicated induction of general endotracheal anesthesia she was placed in a prone position on the Wilson frame. 3, 18-gauge spinal needles were taped to her back and an AP radiograph was obtained to localize the T11 level. SSEP, MEPs and EMG recordings were performed throughout the case. His back was prepped and draped in the usual sterile fashion with ChloraPrep. Area of planned incision was infiltrated with local lidocaine. Incision was made in the  midline and carried to the thoacodorsal fascia which was incised bilaterally.  A total laminectomy of inferior T 9 to the top of T 12  was performed with leksell, and the spinal cord dura was defined. Ultrasound was used to confirm location of intramedullary mass lesion. The dura was opened to expose the spinal cord and the dura and arachnoid was tacked up. A midline myelotomy was performed under microdissection and a cystic cavity in the spinal cord was entered.Fibrinous and discolored material was identified and then removed with a micro-pituitary.  Ultrasound was brought back in and confirmed that we were in the mass.  Additional caudal material was identified and removed.  At this point it was felt that all neural elements were well decompressed and ultrasound confirmed that the mass had been removed. Hemostasis was assured.The dura was closed with a running 6-0 prolene stitch in a water tight fashion. The wound was then irrigated with saline. Hemostasis was assured with gelfoam. The lumbodorsal fascia was closed with 0 Vicryl sutures the subcutaneous tissues reapproximated 2-0 Vicryl inverted sutures and the skin edges were reapproximated with 3-0  Running nylon stitch. The wound is dressed with Benzoin and steristrips. Patient was extubated in the operating room and taken to recovery in stable and satisfactory condition having tolerated his operation well counts were correct at the end of the case. Electrical recordings were preserved throughout the surgery.   PLAN OF CARE: Admit to inpatient   PATIENT DISPOSITION:  PACU - hemodynamically stable.   Delay start of Pharmacological VTE agent (>24hrs) due to surgical blood loss or risk of bleeding: yes

## 2012-07-13 NOTE — Preoperative (Signed)
Beta Blockers   Reason not to administer Beta Blockers:Not Applicable 

## 2012-07-13 NOTE — Interval H&P Note (Signed)
History and Physical Interval Note:  07/13/2012 6:26 AM  [name]  has presented today for surgery, with the diagnosis of Spinal cord tumor, Thoracic spondylosis with myelopathy, Thoracic radiculopathy  The various methods of treatment have been discussed with the patient and family. After consideration of risks, benefits and other options for treatment, the patient has consented to  Procedure(s) with comments: THORACIC LAMINECTOMY FOR TUMOR (N/A) - Thoracic laminectomy for resection of intramedullary spinal cord tumor with spinal cord monitering and Dr. Christella Noa to assist as a surgical intervention .  The patient's history has been reviewed, patient examined, no change in status, stable for surgery.  I have reviewed the patient's chart and labs.  Questions were answered to the patient's satisfaction.     [name] D

## 2012-07-13 NOTE — Anesthesia Procedure Notes (Signed)
Procedure Name: Intubation Date/Time: 07/13/2012 7:41 AM Performed by: Sherilyn Banker Pre-anesthesia Checklist: Patient identified, Emergency Drugs available, Suction available, Patient being monitored and Timeout performed Patient Re-evaluated:Patient Re-evaluated prior to inductionOxygen Delivery Method: Circle system utilized Preoxygenation: Pre-oxygenation with 100% oxygen Intubation Type: IV induction Laryngoscope Size: Mac and 3 Grade View: Grade III Tube type: Oral Tube size: 7.5 mm Number of attempts: 2 Airway Equipment and Method: Stylet,  Bougie stylet and LTA kit utilized Placement Confirmation: ETT inserted through vocal cords under direct vision,  positive ETCO2 and breath sounds checked- equal and bilateral Secured at: 22 cm Dental Injury: Teeth and Oropharynx as per pre-operative assessment

## 2012-07-13 NOTE — Progress Notes (Signed)
On arrival to pacu, pt. With oral& nasal airways, attempted bipap but unable to obtain seal, pt. Bagged for low sats, airways remain,pt. Breathing on own with facemask

## 2012-07-13 NOTE — Anesthesia Preprocedure Evaluation (Signed)
Anesthesia Evaluation  Patient identified by MRN, date of birth, ID band Patient awake    Reviewed: Allergy & Precautions, H&P , NPO status , Patient's Chart, lab work & pertinent test results  Airway Mallampati: I TM Distance: >3 FB Neck ROM: full    Dental   Pulmonary sleep apnea ,          Cardiovascular hypertension, + CAD Rhythm:regular Rate:Normal     Neuro/Psych    GI/Hepatic   Endo/Other    Renal/GU      Musculoskeletal   Abdominal   Peds  Hematology   Anesthesia Other Findings   Reproductive/Obstetrics                           Anesthesia Physical Anesthesia Plan  ASA: III  Anesthesia Plan: General   Post-op Pain Management:    Induction: Intravenous  Airway Management Planned: Oral ETT  Additional Equipment:   Intra-op Plan:   Post-operative Plan: Extubation in OR  Informed Consent: I have reviewed the patients History and Physical, chart, labs and discussed the procedure including the risks, benefits and alternatives for the proposed anesthesia with the patient or authorized representative who has indicated his/her understanding and acceptance.     Plan Discussed with: CRNA, Anesthesiologist and Surgeon  Anesthesia Plan Comments:         Anesthesia Quick Evaluation

## 2012-07-13 NOTE — Progress Notes (Signed)
Patient brought in his home cpap unit. Patient is tolerating his home cpap well sat 94%. Patient told RT that the cpap machine makes its on oxygen concentration, there is a hose that runs from the cpap machine. Patient does not know how much 02 it produces. RT will continue to monitor.

## 2012-07-14 NOTE — Progress Notes (Signed)
Patient ID: [name], male   DOB: 1935-11-21, 77 y.o.   MRN: [id number] Vital signs are stable. Patient complains of pain In suprapubic region across lower abdomen. He notes this is not consistent with bladder pain that he's had in the past.  On examination his incision is clean dry and the dressing was changed and painted with Hibiclens solution. He has a iodine allergy. Motor function in lower extremities appears excellent. He is fully ambulatory  Stable postoperatively. Ambulate today. Remove Foley catheter. Transfer to floor stable.

## 2012-07-14 NOTE — Progress Notes (Signed)
RT placed patient on his home cpap with 10lpm 02 bleed in. Patient is tolerating cpap well at this time. RT will continue to monitor.

## 2012-07-14 NOTE — Evaluation (Signed)
Physical Therapy Evaluation Patient Details Name: CAS MERGEL MRN: [id number] DOB: 12/28/1935 Today's Date: 07/14/2012 Time: [id number] PT Time Calculation (min): 28 min  PT Assessment / Plan / Recommendation Clinical Impression  77 y.o. male admitted to [facility] for thoracic tumor removal and laminectomy T10-11 levels.  He presents today generally talkative and alert.  He seems to be in normal post-operative pain, althoiugh is reporting lower pelvic pain.  He was able to be ambulatory, but not safely yet.  He is extremely distracted during gait and staggering even with RW.  I recommend HHPT f/u at discharge as pt has a 3 story home to access.  He Needs back precaution education and continued reinforcement of safety.  He says he thinks he has a RW at home that was his father-in-laws.      PT Assessment  Patient needs continued PT services    Follow Up Recommendations  Home health PT;Supervision/Assistance - 24 hour    Does the patient have the potential to tolerate intense rehabilitation     NA  Barriers to Discharge Inaccessible home environment 3 story home    Equipment Recommendations  Other (comment) (pt states he has a RW at home "in the attic")    Recommendations for Other Services   none  Frequency Min 5X/week    Precautions / Restrictions Precautions Precautions: Fall Precaution Comments: pt is impulsive, quick to move Restrictions Weight Bearing Restrictions: No   Pertinent Vitals/Pain See vitals flow sheet      Mobility  Bed Mobility Bed Mobility: Rolling Left;Left Sidelying to Sit;Sitting - Scoot to Edge of Bed Rolling Left: 4: Min assist;With rail Left Sidelying to Sit: 3: Mod assist;HOB elevated;With rails Sitting - Scoot to Edge of Bed: 5: Supervision;With rail Details for Bed Mobility Assistance: pt verbally instructed on log roll technique.  Needed assist at trunk to get to sidelying and to assist up to sitting from sidelying.  Pt pulling on railing and  therapist to get to sitting.   Transfers Transfers: Sit to Stand;Stand to Sit Sit to Stand: 4: Min assist;With upper extremity assist;From bed Stand to Sit: 4: Min assist;With upper extremity assist;With armrests;To chair/3-in-1 Details for Transfer Assistance: verbal cues for safe hand placement.  Assist needed to steady trunk during transition Ambulation/Gait Ambulation/Gait Assistance: 4: Min assist Ambulation Distance (Feet): 220 Feet Assistive device: Rolling walker Ambulation/Gait Assistance Details: min assist due to 2 instances of legs buckling and LOB.  Verbal cues for safe use of RW, assist for steering and verbal cues for upright posture.   Gait Pattern: Step-through pattern;Trunk flexed (staggering) General Gait Details: pt very distracted, talking to other staff while increased LOB during gait.  He was also taking his hands off of his RW and trying to walk away from it to speak with one of the RNs.          PT Diagnosis: Difficulty walking;Abnormality of gait;Generalized weakness;Acute pain  PT Problem List: Decreased strength;Decreased activity tolerance;Decreased balance;Decreased mobility;Decreased knowledge of use of DME;Decreased knowledge of precautions;Pain PT Treatment Interventions: DME instruction;Gait training;Stair training;Functional mobility training;Therapeutic activities;Therapeutic exercise;Balance training;Patient/family education   PT Goals Acute Rehab PT Goals PT Goal Formulation: With patient Time For Goal Achievement: 07/21/12 Potential to Achieve Goals: Good Pt will Roll Supine to Right Side: with modified independence PT Goal: Rolling Supine to Right Side - Progress: Goal set today Pt will Roll Supine to Left Side: with modified independence PT Goal: Rolling Supine to Left Side - Progress: Goal set today Pt will  go Supine/Side to Sit: with modified independence PT Goal: Supine/Side to Sit - Progress: Goal set today Pt will go Sit to Supine/Side:  with modified independence;with HOB 0 degrees PT Goal: Sit to Supine/Side - Progress: Goal set today Pt will go Sit to Stand: with supervision PT Goal: Sit to Stand - Progress: Goal set today Pt will go Stand to Sit: with supervision PT Goal: Stand to Sit - Progress: Goal set today Pt will Ambulate: >150 feet;with supervision;with least restrictive assistive device PT Goal: Ambulate - Progress: Goal set today Pt will Go Up / Down Stairs: Flight;with supervision;with rail(s) PT Goal: Up/Down Stairs - Progress: Goal set today  Visit Information  Last PT Received On: 07/14/12 Assistance Needed: +1    Subjective Data  Subjective: Pt is from the [place].  He is very inappropriate in his conversations and jokes.  This seems to be his baseline Patient Stated Goal: to go home, back to normal functioning   Prior Early Lives With: Spouse Available Help at Discharge: Available 24 hours/day;Family Type of Home: House Home Access: Stairs to enter [employer] of Steps: 3 Entrance Stairs-Rails: Left Home Layout: Multi-level (3 levels) Alternate Level Stairs-Number of Steps: 8 Alternate Level Stairs-Rails: Right Bathroom Shower/Tub: Tub/shower unit;Door [employer] Toilet: Handicapped height Bathroom Accessibility: Yes How Accessible: Accessible via walker Home Adaptive Equipment: Walker - rolling;Shower chair without back Additional Comments: wife's bathroom is more accessible Prior Function Level of Independence: Independent Able to Take Stairs?: Yes Driving: Yes Vocation: Full time employment 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   Communication Communication: No difficulties Dominant Hand: Left    Cognition  Cognition Overall Cognitive Status: Appears within functional limits for tasks assessed/performed Arousal/Alertness: Awake/alert Orientation Level: Appears intact for tasks assessed Behavior During Session: [facility]  for tasks performed Cognition - Other Comments: pt is inappropriate, but this is likely baseline.  He is also moderately impulsive and quick to move.      Extremity/Trunk Assessment Right Upper Extremity Assessment RUE ROM/Strength/Tone: [facility] for tasks assessed Left Upper Extremity Assessment LUE ROM/Strength/Tone: Deficits LUE ROM/Strength/Tone Deficits: functionally, with fatigue some buckling noted in bil legs.  Pt reports they feel weak.   Right Lower Extremity Assessment RLE ROM/Strength/Tone: Deficits RLE ROM/Strength/Tone Deficits: functionally, with fatigue some buckling noted in bil legs.  Pt reports they feel weak.  right and left feel equally weak per pt.  Left Lower Extremity Assessment LLE ROM/Strength/Tone: Within functional levels      End of Session PT - End of Session Activity Tolerance: Patient limited by fatigue Patient left: in chair;with call bell/phone within reach Nurse Communication: Mobility status    Wells Guiles B. Winigan, [place], DPT [phone]   07/14/2012, 11:34 AM

## 2012-07-15 MED ORDER — PANTOPRAZOLE SODIUM 40 MG PO TBEC
40.0000 mg | DELAYED_RELEASE_TABLET | Freq: Every day | ORAL | Status: DC
  Administered 2012-07-15 – 2012-07-17 (×3): 40 mg via ORAL
  Filled 2012-07-15 (×2): qty 1

## 2012-07-15 NOTE — Progress Notes (Signed)
RT placed patient on home cpap with 5lpm 02 bleed in. RT will continue to monitor.

## 2012-07-15 NOTE — Progress Notes (Signed)
Subjective: Patient reports Vital signs are stable. Motor function is intact. Patient still has pain low across abdomen and into groin. Rated at 4/10. Ambulating well  Objective: Vital signs in last 24 hours: Temp:  [97.6 F (36.4 C)-98.3 F (36.8 C)] 97.6 F (36.4 C) (04/13 0445) Pulse Rate:  [54-82] 56 (04/13 0700) Resp:  [8-24] 12 (04/13 0700) BP: (115-159)/(27-87) 130/56 mmHg (04/13 0700) SpO2:  [90 %-96 %] 95 % (04/13 0700) Weight:  [101.4 kg (223 lb 8.7 oz)] 101.4 kg (223 lb 8.7 oz) (04/13 0400)  Intake/Output from previous day: 04/12 0701 - 04/13 0700 In: [id number] [P.O.:900; I.V.:750] Out: 2125 [Urine:2125] Intake/Output this shift:    Incision is clean and dry motor function is good in lower extremities station and gait are good  Lab Results: No results found for this basename: WBC, HGB, HCT, PLT,  in the last 72 hours BMET No results found for this basename: NA, K, CL, CO2, GLUCOSE, BUN, CREATININE, CALCIUM,  in the last 72 hours  Studies/Results: [place] Intraoperative  07/13/2012  CLINICAL DATA: spinal cord mass   Ultrasound was provided for use by the ordering physician, and a technical  charge was applied by the performing facility.  No radiologist  interpretation/professional services rendered.      Assessment/Plan: Stable postop day 2  LOS: 2 days  Transfer to floor for further recovery.   Saketh Daubert J 07/15/2012, 9:07 AM

## 2012-07-15 NOTE — Progress Notes (Signed)
Physical Therapy Treatment Patient Details Name: [name] MRN: [id number] DOB: April 07, 1935 Today's Date: 07/15/2012 Time: [id number] PT Time Calculation (min): 18 min  PT Assessment / Plan / Recommendation Comments on Treatment Session  77 y.o. male admitted to [facility] for thoracic tumor removal and laminectomy T10-11 levels.  Today he is POD #2 and moving well.  Needs same amount of assistance both with and without RW.  At this point he could still benefit from HHPT safety eval since he has a 3 level home and continues to have some safety issues both using and not using the RW.  Reviewed back precautions for comfort.      Follow Up Recommendations  Home health PT;Supervision/Assistance - 24 hour (safety eval)     Does the patient have the potential to tolerate intense rehabilitation    NA  Barriers to Discharge   none      Equipment Recommendations  Other (comment) (pt states he has a RW at home "in the attic")    Recommendations for Other Services   none  Frequency Min 5X/week   Plan Discharge plan remains appropriate;Frequency remains appropriate    Precautions / Restrictions Precautions Precautions: Fall Precaution Comments: pt is impulsive, will not focus and be serious during gait.     Pertinent Vitals/Pain See vitals flow sheet    Mobility  Bed Mobility Bed Mobility: Rolling Right;Right Sidelying to Sit;Sitting - Scoot to Edge of Bed Rolling Right: 6: Modified independent (Device/Increase time);With rail Right Sidelying to Sit: 5: Supervision;With rails;HOB elevated Sitting - Scoot to Edge of Bed: 5: Supervision;With rail Details for Bed Mobility Assistance: heavy use of railing to get to sitting.  Verbal cues for log roll Transfers Transfers: Sit to Stand;Stand to Sit Sit to Stand: 4: Min guard;With upper extremity assist;From bed Stand to Sit: 5: Supervision;With armrests;With upper extremity assist;To chair/3-in-1 Details for Transfer Assistance: min guard assist  to steady pt for balance upon first standing from bed.  Supervision with verbal cues for hand placement to sit.   Ambulation/Gait Ambulation/Gait Assistance: 4: Min assist Ambulation Distance (Feet): 220 Feet Assistive device: 1 person hand held assist Ambulation/Gait Assistance Details: one person hand held assist to steady pt for balance.  Verbal cues needed to stop trying to dance and be silly during gait so that I could tell if his legs were buckling.   Gait Pattern: Step-through pattern (less trunk flexion without RW. ) General Gait Details: pt reports he still feels like he needs RW. RN reports when they walk pt with RW he is picking it up off of the floor, letting go of it, etc. and it may be more of a risk for him to use it than if he does not use it.        PT Goals Acute Rehab PT Goals PT Goal: Rolling Supine to Right Side - Progress: Met PT Goal: Supine/Side to Sit - Progress: Progressing toward goal PT Goal: Sit to Stand - Progress: Progressing toward goal PT Goal: Stand to Sit - Progress: Met PT Goal: Ambulate - Progress: Progressing toward goal  Visit Information  Last PT Received On: 07/15/12 Assistance Needed: +1    Subjective Data  Subjective: Pt continues to tell inappropriate jokes.  Wife reports this is normal.     Cognition  Cognition Overall Cognitive Status: Appears within functional limits for tasks assessed/performed Arousal/Alertness: Awake/alert Orientation Level: Appears intact for tasks assessed Behavior During Session: [employer] for tasks performed Cognition - Other Comments: pt is inappropriate,  but this is his baseline.  He is also moderately impulsive and quick to move.         End of Session PT - End of Session Activity Tolerance: Patient tolerated treatment well Patient left: in chair;with call bell/phone within reach;with family/visitor present Nurse Communication: Mobility status     Wells Guiles B. [place], [place], DPT [phone]   07/15/2012, 11:57  AM

## 2012-07-16 NOTE — Clinical Social Work Note (Signed)
Clinical Social Worker received referral for possible ST-SNF placement.  Chart reviewed.  PT/OT recommending home with home health.  Spoke with RN Case Manager who will follow up with patient to discuss home health needs.    CSW signing off - please re consult if social work needs arise.  Jesse Lessa Huge, LCSW [phone] 

## 2012-07-16 NOTE — Progress Notes (Signed)
Subjective: Patient reports doing well.  Objective: Vital signs in last 24 hours: Temp:  [97.1 F (36.2 C)-98.5 F (36.9 C)] 98.5 F (36.9 C) (04/14 0339) Pulse Rate:  [50-65] 59 (04/14 0535) Resp:  [11-19] 13 (04/14 0535) BP: (115-153)/(41-76) 153/68 mmHg (04/14 0535) SpO2:  [92 %-98 %] 93 % (04/14 0535) FiO2 (%):  [21 %] 21 % (04/14 0000)  Intake/Output from previous day: 04/13 0701 - 04/14 0700 In: 900 [P.O.:900] Out: 1500 [Urine:1500] Intake/Output this shift:    Physical Exam: Full strength both lower extremities.  Working on bladder and bowels.  Lab Results: No results found for this basename: WBC, HGB, HCT, PLT,  in the last 72 hours BMET No results found for this basename: NA, K, CL, CO2, GLUCOSE, BUN, CREATININE, CALCIUM,  in the last 72 hours  Studies/Results: No results found.  Assessment/Plan: Some intermittent lower abdominal pain. Working on bladder and bowels.Transfer to floor.    LOS: 3 days    Peggyann Shoals, MD 07/16/2012, 7:40 AM

## 2012-07-16 NOTE — Progress Notes (Signed)
Physical Therapy Treatment Patient Details Name: [name] MRN: [id number] DOB: 04-04-1936 Today's Date: 07/16/2012 Time: [id number] PT Time Calculation (min): 25 min  PT Assessment / Plan / Recommendation Comments on Treatment Session  Patient progressing with ambulation without devices.  May benefit from cane for safety especially with having one rail for support for multiple steps in the home.  Will try at next visit.    Follow Up Recommendations  Home health PT;Supervision/Assistance - 24 hour           Equipment Recommendations   (TBA (maybe cane))       Frequency Min 5X/week   Plan Discharge plan remains appropriate    Precautions / Restrictions Precautions Precautions: Fall Precaution Comments: back precautions for comfort Restrictions Weight Bearing Restrictions: No   Pertinent Vitals/Pain 5/10 in groin on both sides    Mobility  Bed Mobility Bed Mobility: Supine to Sit;Sit to Sidelying Right Supine to Sit: 6: Modified independent (Device/Increase time) Sit to Sidelying Right: 5: Supervision Details for Bed Mobility Assistance: cues due to sat up without rolling first (maintained precautions with this method, but encouraged to roll to decreased trunk stress) Transfers Sit to Stand: From bed;5: Supervision Stand to Sit: 5: Supervision;To bed Details for Transfer Assistance: continues to move quickly cues for waiting for line management Ambulation/Gait Ambulation/Gait Assistance: 5: Supervision;4: Min guard Ambulation Distance (Feet): 300 Feet Assistive device: None Ambulation/Gait Assistance Details: wide based gait and increased lateral sway with ambulation, but not unsafe to necessitate need for walker. Gait Pattern: Step-to pattern;Wide base of support;Lateral trunk lean to left;Lateral trunk lean to right Stairs: Yes Stairs Assistance: 5: Supervision Stairs Assistance Details (indicate cue type and reason): cues to use step to technique for back  comfort, though able to do step throgh Stair Management Technique: One rail Right;Alternating pattern;Step to pattern;Forwards Number of Stairs: 10    Exercises Other Exercises Other Exercises: Review of precautions for comfort with bed mobility and stairs Other Exercises: Discussed car transfers and lifting 10# or less also for comfort.    PT Goals Acute Rehab PT Goals Pt will Roll Supine to Right Side: with modified independence PT Goal: Rolling Supine to Right Side - Progress: Met Pt will go Supine/Side to Sit: with modified independence Pt will go Sit to Supine/Side: with modified independence;with HOB 0 degrees PT Goal: Sit to Supine/Side - Progress: Progressing toward goal Pt will go Sit to Stand: with supervision PT Goal: Sit to Stand - Progress: Met Pt will go Stand to Sit: with supervision PT Goal: Stand to Sit - Progress: Met Pt will Ambulate: >150 feet;with least restrictive assistive device;with supervision PT Goal: Ambulate - Progress: Met Pt will Go Up / Down Stairs: Flight;with supervision;with rail(s) PT Goal: Up/Down Stairs - Progress: Progressing toward goal  Visit Information  Last PT Received On: 07/16/12    Subjective Data  Subjective: I can't feel to try to stop urine flow.  Just notice I can do it cause it stops and starts.   Cognition  Cognition Overall Cognitive Status: Appears within functional limits for tasks assessed/performed Arousal/Alertness: Awake/alert Orientation Level: Appears intact for tasks assessed Behavior During Session: [facility] for tasks performed Cognition - Other Comments: still quick to move and in constant motion during conversation    Balance  Balance Balance Assessed: Yes Static Standing Balance Static Standing - Balance Support: No upper extremity supported Static Standing - Level of Assistance: 6: Modified independent (Device/Increase time) Static Standing - Comment/# of Minutes: no loss of  balance standing during  conversation  End of Session PT - End of Session Equipment Utilized During Treatment: Gait belt Activity Tolerance: Patient tolerated treatment well Patient left: in bed;with call bell/phone within reach   GP     [facility] 07/16/2012, 12:03 PM [place], [place] 07/16/2012

## 2012-07-17 NOTE — Progress Notes (Signed)
Subjective: Patient reports "I get to go home today, right?"  Objective: Vital signs in last 24 hours: Temp:  [97.4 F (36.3 C)-98.6 F (37 C)] 98.1 F (36.7 C) (04/15 0600) Pulse Rate:  [59-79] 62 (04/15 0600) Resp:  [18-23] 20 (04/15 0600) BP: (131-155)/(55-89) 151/60 mmHg (04/15 0600) SpO2:  [90 %-95 %] 95 % (04/15 0600)  Intake/Output from previous day: 04/14 0701 - 04/15 0700 In: 240 [P.O.:240] Out: 1151 [Urine:1150; Stool:1] Intake/Output this shift:    Awakens to voice. No c/o pain or discomfort. Pt notes numbness persists at penis and rectum. No difficulties with voiding or defecating. No incontinence. Good strength BLE. Incision without erythema, swelling, or drainage. Suture intact, edges well-approximated. Abdominal pain noted past two days now resolved per pt.    Lab Results: No results found for this basename: WBC, HGB, HCT, PLT,  in the last 72 hours BMET No results found for this basename: NA, K, CL, CO2, GLUCOSE, BUN, CREATININE, CALCIUM,  in the last 72 hours  Studies/Results: No results found.  Assessment/Plan: Improving   LOS: 4 days  Per Dr. Vertell Limber, d/c IV, d/c to home. HHPT. Rx's to chart: Decadron 4mg  1po bid x 4days, then 1po QDx4days to end, #12; Hydrocodone 5/325 1-2 po q4hrs prn pain #50.  Pt will call when home to make appt for suture removal next Wednesday. Pt verballizes understanding of d/c instructions.   Verdis Prime 07/17/2012, 7:46 AM

## 2012-07-17 NOTE — Discharge Summary (Signed)
Physician Discharge Summary  Patient ID: [name] MRN: [id number] DOB/AGE: 75/02/1936 75 y.o.  Admit date: 07/13/2012 Discharge date: 07/17/2012  Admission Diagnoses: Spinal cord tumor, Thoracic spondylosis with myelopathy, Thoracic radiculopathy T 10 - T 11    Discharge Diagnoses: Spinal cord tumor, Thoracic spondylosis with myelopathy, Thoracic radiculopathy T 10 - T 11 s/p THORACIC LAMINECTOMY FOR RESECTION OF INTRAMEDULLARY SPINAL CHORD TUMOR WITH SPINAL CHORD MONITORING (N/A) - Thoracic laminectomy for resection of intramedullary spinal cord tumor with spinal cord monitering and Dr. Christella Noa to assist     Active Problems:   * No active hospital problems. *   Discharged Condition: good  Hospital Course: [name] was admitted for surgery on 07-13-12 with Dx spinal cord tumor with myelopathy.  This tumor was felt likely to be a metastatic malignancy per radiology interpretation of preoperative MRI and patient was developing progressively worsening symptoms of myelopathy, which led [place] to proceed with surgery. Following uncomplicated laminectomy and resection of intermedullary tumor, he recovered in Neuro PACU before transfer to Neuro ICU. He has progressed steadily, moving to 4N for nursing and therapy assistance. Pathology was felt to be consistent with a benign spinal cord lesion and not a malignant one.  Consults: None  Significant Diagnostic Studies: radiology: Ultrasound: intra-operative  Treatments: surgery: THORACIC LAMINECTOMY FOR RESECTION OF INTRAMEDULLARY SPINAL CHORD TUMOR WITH SPINAL CHORD MONITORING (N/A) - Thoracic laminectomy for resection of intramedullary spinal cord tumor with spinal cord monitering and Dr. Christella Noa to assist    Discharge Exam: Blood pressure 151/60, pulse 62, temperature 98.1 F (36.7 C), temperature source Oral, resp. rate 20, height 5\' 11"  (1.803 m), weight 101.4 kg (223 lb 8.7 oz), SpO2 95.00%. Awakens to voice. No c/o pain or  discomfort. Pt notes numbness persists at penis and rectum. No difficulties with voiding or defecating. No incontinence. Good strength BLE. Incision without erythema, swelling, or drainage. Suture intact, edges well-approximated. Abdominal pain noted past two days now resolved per pt.      Disposition: 01-Home or Self Care HHPT. Rx's to chart: Decadron 4mg  1po bid x 4days, then 1po QDx4days to end, #12; Hydrocodone 5/325 1-2 po q4hrs prn pain #50.  Pt will call when home to make appt for suture removal next Wednesday. Pt verballizes understanding of d/c instructions.       Medication List    ASK your doctor about these medications       acetaminophen 500 MG tablet  Commonly known as:  TYLENOL  Take 1,000 mg by mouth every 6 (six) hours as needed. For pain.     allopurinol 300 MG tablet  Commonly known as:  ZYLOPRIM  Take 300 mg by mouth daily.     amLODipine 10 MG tablet  Commonly known as:  NORVASC  Take 10 mg by mouth daily.     aspirin EC 81 MG tablet  Take 81 mg by mouth daily.     atorvastatin 20 MG tablet  Commonly known as:  LIPITOR  Take 20 mg by mouth daily.     clopidogrel 75 MG tablet  Commonly known as:  PLAVIX  Take 75 mg by mouth daily.     escitalopram 10 MG tablet  Commonly known as:  LEXAPRO  Take 10 mg by mouth daily.     Hydrocodone-Acetaminophen 2.5-325 MG Tabs  Take 1 tablet by mouth every 4 (four) hours. For pain     oxybutynin 15 MG 24 hr tablet  Commonly known as:  DITROPAN XL  Take 15  mg by mouth at bedtime.     rosuvastatin 20 MG tablet  Commonly known as:  CRESTOR  Take 20 mg by mouth daily.     tamsulosin 0.4 MG Caps  Commonly known as:  FLOMAX  Take 0.4 mg by mouth at bedtime.     valsartan 80 MG tablet  Commonly known as:  DIOVAN  Take 80 mg by mouth daily.         Signed: Verdis Prime 07/17/2012, 7:53 AM

## 2012-07-17 NOTE — Progress Notes (Signed)
Doing well.  Abdominal pain improved.  D/C home.

## 2012-07-17 NOTE — Care Management Note (Signed)
    Page 1 of 1   07/17/2012     11:18:23 AM   CARE MANAGEMENT NOTE 07/17/2012  Patient:  [name], [name]   Account Number:  [id number]  Date Initiated:  07/13/2012  Documentation initiated by:  [facility]  Subjective/Objective Assessment:   Thoracic laminectomy for resection of intramedullary spinal cord tumor     Action/Plan:   [facility] vs SNF   Anticipated DC Date:  07/17/2012   Anticipated DC Plan:  [place]  CM consult      Choice offered to / List presented to:  C-1 Patient        [place] arranged  [place] [facility]      [place].   Status of service:  Completed, signed off Medicare Important Message given?   (If response is "NO", the following Medicare IM given date fields will be blank) Date Medicare IM given:   Date Additional Medicare IM given:    Discharge Disposition:  [place]  Per UR Regulation:  Reviewed for med. necessity/level of care/duration of stay  If discussed at [place] of Stay Meetings, dates discussed:    Comments:  07/17/12 Spoke with patient and his wife about home health for HHPT.They chose [facility] from [facility] list of agencies. Contacted Amariana Mirando at [facility] and set up [facility]. No equipment needs identified. Fuller Plan RN, BSN, CCM

## 2012-07-17 NOTE — Progress Notes (Signed)
Occupational Therapy Evaluation Patient Details Name: [name] MRN: [id number] DOB: 30-Jan-1936 Today's Date: 07/17/2012 Time: [id number] OT Time Calculation (min): 23 min  OT Assessment / Plan / Recommendation Clinical Impression  S/p lumbar tumor removal. Completed education regarding ADL and back precautions for comfort. Educated pt on available AE if needed. Pt will have 24/7 S from wife at D/C. Given written handout with information. No further OT indicated at this time. REady to D/C.    OT Assessment  Patient does not need any further OT services    Follow Up Recommendations  No OT follow up    Barriers to Discharge      Equipment Recommendations  None recommended by OT    Recommendations for Other Services    Frequency       Precautions / Restrictions Precautions Precautions: Fall Precaution Comments: back precautions for comfort   Pertinent Vitals/Pain no apparent distress     ADL  Grooming: Modified independent Where Assessed - Grooming: Unsupported standing Upper Body Bathing: Modified independent Where Assessed - Upper Body Bathing: Unsupported sitting Lower Body Bathing: Minimal assistance Where Assessed - Lower Body Bathing: Unsupported sit to stand Upper Body Dressing: Modified independent Where Assessed - Upper Body Dressing: Unsupported sitting Lower Body Dressing: Minimal assistance Where Assessed - Lower Body Dressing: Unsupported sit to stand Toilet Transfer: Modified independent Toilet Transfer Method: Sit to Loss adjuster, chartered: Comfort height toilet Toileting - Clothing Manipulation and Hygiene: Modified independent Where Assessed - Best boy and Hygiene: Sit to stand from 3-in-1 or toilet Tub/Shower Transfer: Supervision/safety Tub/Shower Transfer Method: Therapist, art: Shower seat with back Equipment Used: Gait belt Transfers/Ambulation Related to ADLs: mod I ADL Comments:  Reviewed back precautions for comfort. Pt able to use cross leg technique for LB ADL. Rec for pt to use showerchair in walk in shower and have grab bars installed.     OT Diagnosis:    OT Problem List:   OT Treatment Interventions:     OT Goals Acute Rehab OT Goals OT Goal Formulation:  (eval only)  Visit Information  Last OT Received On: 07/17/12 Assistance Needed: +1    Subjective Data      Prior Functioning     Home Living Lives With: Spouse Available Help at Discharge: Available 24 hours/day;Family Type of Home: House Home Access: Stairs to enter [employer] of Steps: 3 Entrance Stairs-Rails: Left Home Layout: Multi-level Alternate Level Stairs-Number of Steps: 8 Alternate Level Stairs-Rails: Right Bathroom Shower/Tub: Tub/shower unit;Door [employer] Toilet: Handicapped height Bathroom Accessibility: Yes How Accessible: Accessible via walker Home Adaptive Equipment: Walker - rolling;Shower chair without back Additional Comments: wife's bathroom is more accessible Prior Function Level of Independence: Independent Able to Take Stairs?: Yes Driving: Yes Vocation: Full time employment         Vision/Perception     Cognition  Cognition Overall Cognitive Status: Appears within functional limits for tasks assessed/performed Arousal/Alertness: Awake/alert Orientation Level: Appears intact for tasks assessed Behavior During Session: [facility] for tasks performed    Extremity/Trunk Assessment Right Upper Extremity Assessment RUE ROM/Strength/Tone: [facility] for tasks assessed Left Upper Extremity Assessment LUE ROM/Strength/Tone: WFL for tasks assessed Right Lower Extremity Assessment RLE ROM/Strength/Tone: [facility] for tasks assessed Left Lower Extremity Assessment LLE ROM/Strength/Tone: [facility] for tasks assessed     Mobility Bed Mobility Bed Mobility: Supine to Sit;Sit to Supine Rolling Right: 6: Modified independent (Device/Increase time) Rolling Left: 6:  Modified independent (Device/Increase time) Right Sidelying to Sit: 6: Modified independent (Device/Increase  time) Left Sidelying to Sit: 6: Modified independent (Device/Increase time) Transfers Transfers: Sit to Stand;Stand to Sit Sit to Stand: 6: Modified independent (Device/Increase time) Stand to Sit: 6: Modified independent (Device/Increase time)     Exercise     Balance  S   End of Session OT - End of Session Equipment Utilized During Treatment: Gait belt Activity Tolerance: Patient tolerated treatment well Patient left: in bed;with call bell/phone within reach;with family/visitor present Nurse Communication: Mobility status  GO     Vyctoria Dickman,H[place]ARY 07/17/2012, 10:28 AM Maurie Boettcher, OTR/L  [phone] 07/17/2012

## 2012-07-18 DIAGNOSIS — IMO0001 Reserved for inherently not codable concepts without codable children: Secondary | ICD-10-CM | POA: Diagnosis not present

## 2012-07-18 DIAGNOSIS — Z48811 Encounter for surgical aftercare following surgery on the nervous system: Secondary | ICD-10-CM | POA: Diagnosis not present

## 2012-07-18 DIAGNOSIS — M4714 Other spondylosis with myelopathy, thoracic region: Secondary | ICD-10-CM | POA: Diagnosis not present

## 2012-07-18 DIAGNOSIS — IMO0002 Reserved for concepts with insufficient information to code with codable children: Secondary | ICD-10-CM | POA: Diagnosis not present

## 2012-07-19 ENCOUNTER — Encounter (HOSPITAL_COMMUNITY): Payer: Self-pay | Admitting: Neurosurgery

## 2012-07-25 DIAGNOSIS — IMO0002 Reserved for concepts with insufficient information to code with codable children: Secondary | ICD-10-CM | POA: Diagnosis not present

## 2012-07-25 DIAGNOSIS — Z48811 Encounter for surgical aftercare following surgery on the nervous system: Secondary | ICD-10-CM | POA: Diagnosis not present

## 2012-07-25 DIAGNOSIS — M4714 Other spondylosis with myelopathy, thoracic region: Secondary | ICD-10-CM | POA: Diagnosis not present

## 2012-07-25 DIAGNOSIS — IMO0001 Reserved for inherently not codable concepts without codable children: Secondary | ICD-10-CM | POA: Diagnosis not present

## 2012-09-05 DIAGNOSIS — N529 Male erectile dysfunction, unspecified: Secondary | ICD-10-CM | POA: Diagnosis not present

## 2012-09-05 DIAGNOSIS — C61 Malignant neoplasm of prostate: Secondary | ICD-10-CM | POA: Diagnosis not present

## 2012-09-07 ENCOUNTER — Encounter: Payer: Self-pay | Admitting: Cardiology

## 2012-09-07 ENCOUNTER — Ambulatory Visit (INDEPENDENT_AMBULATORY_CARE_PROVIDER_SITE_OTHER): Payer: Medicare Other | Admitting: Cardiology

## 2012-09-07 VITALS — BP 142/86 | HR 60 | Ht 69.0 in | Wt 213.8 lb

## 2012-09-07 DIAGNOSIS — I2510 Atherosclerotic heart disease of native coronary artery without angina pectoris: Secondary | ICD-10-CM | POA: Diagnosis not present

## 2012-09-07 DIAGNOSIS — E785 Hyperlipidemia, unspecified: Secondary | ICD-10-CM | POA: Diagnosis not present

## 2012-09-07 DIAGNOSIS — I10 Essential (primary) hypertension: Secondary | ICD-10-CM

## 2012-09-07 NOTE — Patient Instructions (Addendum)
You're doing well. Dr. Ellyn Hack is glad to see you back walking. He will seen you back in 6 months. 6 months

## 2012-09-08 ENCOUNTER — Encounter: Payer: Self-pay | Admitting: Cardiology

## 2012-09-08 NOTE — Progress Notes (Signed)
Patient ID: [name], male   DOB: 10-07-1935, 77 y.o.   MRN: [id number]  Clinic Note: HPI: [name] is a 77 y.o. male with a PMH below who presents today for followup of his coronary disease and post back surgery. He has 2 overlapping Cypher drug-eluting stents in the circumflex from 2006. (2.5 mm 20 mm 2.5 mm 2 mm posterior to 2.0 mm.), And was evaluated most recent with a LexiScan Myoview stress test a March 2014 that did not reveal any evidence of ischemia. Low risk with good attenuation EF 55%. No change from 2013 treadmill Myoview. This was done to evaluate the fatigue that he was feeling as well as some atypical wraparound chest pain most likely associated with a tumor that he had in his spine in the T10-T11 disc space.   Interval History: He is now status/post excision of that tumor. He notes that he still has some decreased sensation in the pelvic area including his groin/penis as well as some difficulty with bowel control. He also notes that both feet are essentially non-revealing as his shoes are on. He just now started back walking and driving. That being said he can be chest discomfort he was having his all but gone. He is still worried disease the fatigue and has less energy than he had but his also not been active for a long time now. He denies any chest tightness or chest pressure now with rest research and. No shortness with exertion. No PND, orthopnea, or edema. No lightheadedness, dizziness and wooziness, syncope or near syncope. His balance issues of got older better but still with his feet being numb the overall. No melena, hematochezia or hematuria. No claudication or some claudication symptoms. No palpitations or TIA/amaurosis fugax symptoms.  Past Medical History  Diagnosis Date  . History of prostate cancer 2004--  S/P SEED IMPLANTS     NO RECURRENCE  . S/P primary angioplasty with coronary stent 2006--  X2 DE STENTS  . Urgency of urination   . Frequency of  urination   . H/O: gout STABLE  . Impaired hearing RIGHT HEARING AID  . Hydrocele, left   . History of melanoma excision 2010    FOREHEAD  . Coronary artery disease CARDIOLOGIST-  [facility]    Myoview March 2014: No skin or infarction  . LVH (left ventricular hypertrophy) due to hypertensive disease MODERATE  . Hyperlipidemia   . Hypertension   . Colon polyps 05/10/2005    Hyperplastic polyps  . Depression   . OSA on CPAP     bipap  . Heart murmur     hx  . Cancer     prostate , melanoma head  . Arthritis   . Shingles 07  . Gout     Prior cardiac evaluation and past surgical history: Past Surgical History  Procedure Laterality Date  . Prostate palladium gold seed implants (118)  11-15-2002    PROSTATE CANCER  . Excision melanoma from forehead  2010  . Coronary angioplasty with stent placement  09-01-2004  DR GAMBLE    X2  DRUG-ELUTING STENTS (CYPHER)  DISTAL TO MID  LEFT CIRCUMFLEX  (REDUCTION 75% LESIONS TO 0% RESIDUAL)  . Transthoracic echocardiogram  [facility] 2011    NORMAL EF/ NORMAL FUNCTION WITH IMPAIRED RELAXATION AND MODERATE CONCENTRIC LVH LIKELY HYPERTENSIVE DISEASE  . Hydrocele excision  10/14/2011    Procedure: HYDROCELECTOMY ADULT;  Surgeon: Ailene Rud, MD;  Location: [facility];  Service:  Urology;  Laterality: Left;  . Circumsision    . Laminectomy N/A 07/13/2012    Procedure: THORACIC LAMINECTOMY FOR RESECTION OF INTRAMEDULLARY SPINAL CHORD TUMOR WITH SPINAL CHORD MONITORING;  Surgeon: Erline Levine, MD;  Location: [place] [facility];  Service: Neurosurgery;  Laterality: N/A;  Thoracic laminectomy for resection of intramedullary spinal cord tumor with spinal cord monitering and Dr. Christella Noa to assist    Allergies  Allergen Reactions  . Betadine (Povidone Iodine) Anaphylaxis  . Contrast Media (Iodinated Diagnostic Agents) Anaphylaxis  . Shellfish Allergy Anaphylaxis    Current Outpatient Prescriptions  Medication Sig Dispense Refill    . acetaminophen (TYLENOL) 500 MG tablet Take 1,000 mg by mouth every 6 (six) hours as needed. For pain.      Marland Kitchen allopurinol (ZYLOPRIM) 300 MG tablet Take 300 mg by mouth daily.       Marland Kitchen amLODipine (NORVASC) 10 MG tablet Take 10 mg by mouth daily.       Marland Kitchen aspirin EC 81 MG tablet Take 81 mg by mouth daily.      Marland Kitchen atorvastatin (LIPITOR) 20 MG tablet Take 20 mg by mouth daily.      Marland Kitchen escitalopram (LEXAPRO) 10 MG tablet Take 10 mg by mouth daily.       . Hydrocodone-Acetaminophen 2.5-325 MG TABS Take 1 tablet by mouth every 4 (four) hours. For pain      . oxybutynin (DITROPAN XL) 15 MG 24 hr tablet Take 15 mg by mouth at bedtime.      . rosuvastatin (CRESTOR) 20 MG tablet Take 20 mg by mouth daily.      . tamsulosin (FLOMAX) 0.4 MG CAPS Take 0.4 mg by mouth at bedtime.      . valsartan (DIOVAN) 80 MG tablet Take 80 mg by mouth daily.        No current facility-administered medications for this visit.    History   Social History  . Marital Status: Married    Spouse Name: N/A    Number of Children: N/A  . Years of Education: N/A   Occupational History  . Not on file.   Social History Main Topics  . Smoking status: Never Smoker   . Smokeless tobacco: Never Used     Comment: occ alcohol  . Alcohol Use: Yes     Comment: OCCASIONAL  . Drug Use: No  . Sexually Active: Not on file   Other Topics Concern  . Not on file   Social History Narrative  . No narrative on file    ROS: A comprehensive Review of Systems - Negative except Pertinent positives noted above. Also as noted below. General ROS: positive for  - fatigue negative for - fever, malaise, sleep disturbance, weight gain or weight loss Neurological ROS: no TIA or stroke symptoms positive for - bowel and bladder control changes, gait disturbance and numbness/tingling as described above -   PHYSICAL EXAM BP 142/86  Pulse 60  Ht 5\' 9"  (1.753 m)  Wt 213 lb 12.8 oz (96.979 kg)  BMI 31.56 kg/m2 General appearance: alert,  cooperative, appears stated age, no distress and Pleasant mood and affect. His is back to his usual joking, but albeit pressured speech. Neck: no adenopathy, no carotid bruit, no JVD, supple, symmetrical, trachea midline and thyroid not enlarged, symmetric, no tenderness/mass/nodules Lungs: clear to auscultation bilaterally, normal percussion bilaterally and Nonlabored Heart: regular rate and rhythm, S1, S2 normal, no murmur, click, rub or gallop and normal apical impulse Abdomen: soft, non-tender; bowel sounds normal;  no masses,  no organomegaly Extremities: extremities normal, atraumatic, no cyanosis or edema Pulses: 2+ and symmetric HEENT: [place]/AT, EOMI, MMM, anicteric sclera.   [id number] today: Yes Rate:60  , Rhythm: Normal sinus, T wave inversions in lead 3 otherwise normal ECG.   ASSESSMENT: Stable from cardiac standpoint. Healing from his surgery as hoped.   CAD (coronary artery disease) - Plan: EKG 12-Lead  HTN (hypertension) - Plan: EKG 12-Lead  Hyperlipidemia  PLAN: Per problem list.  No orders of the defined types were placed in this encounter.    Followup: 6 months  Sylver Vantassell W, M.D., M.S. [facility] [address] [place], [place]  [address]  [phone] Pager # [phone] 09/08/2012 10:05 AM

## 2012-09-08 NOTE — Assessment & Plan Note (Signed)
A bit elevated today, but he is somewhat nervous. His previous been well-controlled. Continue on calcium channel blocker and ARB.

## 2012-09-08 NOTE — Assessment & Plan Note (Signed)
Doing well, recent stress test was normal. I don't think his fatigue is related to coronary disease it is more related to his postop state. He is on aspirin, statin and an ARB. His also a calcium blocker for blood pressure control. I don't see these back on Plavix. I think is okay for now been off the Plavix until surgically recovered.

## 2012-09-08 NOTE — Assessment & Plan Note (Signed)
He is on Crestor. I think his labs are followed up by his primary physician who should be seeing soon. He tells me that he is postop and drawn today but forgot and had been previously she becoming the next couple days.

## 2012-09-09 ENCOUNTER — Encounter: Payer: Self-pay | Admitting: *Deleted

## 2012-09-14 ENCOUNTER — Encounter: Payer: Self-pay | Admitting: Cardiology

## 2012-10-02 ENCOUNTER — Other Ambulatory Visit: Payer: Self-pay | Admitting: Cardiology

## 2012-10-02 DIAGNOSIS — Z79899 Other long term (current) drug therapy: Secondary | ICD-10-CM | POA: Diagnosis not present

## 2012-10-02 DIAGNOSIS — E782 Mixed hyperlipidemia: Secondary | ICD-10-CM | POA: Diagnosis not present

## 2012-10-02 LAB — COMPREHENSIVE METABOLIC PANEL
ALT: 13 U/L (ref 0–53)
AST: 14 U/L (ref 0–37)
Albumin: 4.2 g/dL (ref 3.5–5.2)
Alkaline Phosphatase: 51 U/L (ref 39–117)
BUN: 12 mg/dL (ref 6–23)
CO2: 27 mEq/L (ref 19–32)
Calcium: 8.9 mg/dL (ref 8.4–10.5)
Chloride: 105 mEq/L (ref 96–112)
Creat: 0.97 mg/dL (ref 0.50–1.35)
Glucose, Bld: 134 mg/dL — ABNORMAL HIGH (ref 70–99)
Potassium: 3.6 mEq/L (ref 3.5–5.3)
Sodium: 140 mEq/L (ref 135–145)
Total Bilirubin: 0.6 mg/dL (ref 0.3–1.2)
Total Protein: 6.1 g/dL (ref 6.0–8.3)

## 2012-10-03 ENCOUNTER — Telehealth: Payer: Self-pay | Admitting: *Deleted

## 2012-10-03 LAB — NMR LIPOPROFILE WITH LIPIDS
Cholesterol, Total: 145 mg/dL (ref <200)
HDL Particle Number: 30.7 umol/L (ref >=30.5)
HDL Size: 9.4 nm (ref >=9.2)
HDL-C: 36 mg/dL — ABNORMAL LOW (ref >=40)
LDL (calc): 78 mg/dL (ref <100)
LDL Particle Number: 822 nmol/L (ref <1000)
LDL Size: 19.9 nm — ABNORMAL LOW (ref >20.5)
LP-IR Score: 55 — ABNORMAL HIGH (ref <=45)
Large HDL-P: 5 umol/L (ref >=4.8)
Large VLDL-P: 5.5 nmol/L — ABNORMAL HIGH (ref <=2.7)
Small LDL Particle Number: 597 nmol/L — ABNORMAL HIGH (ref <=527)
Triglycerides: 157 mg/dL — ABNORMAL HIGH (ref <150)
VLDL Size: 48.7 nm — ABNORMAL HIGH (ref <=46.6)

## 2012-10-03 MED ORDER — EZETIMIBE 10 MG PO TABS: 10.0000 mg | ORAL_TABLET | Freq: Every day | ORAL | Status: DC

## 2012-10-03 NOTE — Telephone Encounter (Signed)
Spoke to patient. Result given.Zetia 10 mg sent to [facility] patient is aware.

## 2012-10-03 NOTE — Telephone Encounter (Signed)
Message copied by Raiford Simmonds on Wed Oct 03, 2012 10:07 AM ------      Message from: [facility], DAVID      Created: Wed Oct 03, 2012  9:01 AM       Labs look close to, but not at goal.  Continue with statin -- add Zetia 10mg  daily.              Leonie Man, MD       ------

## 2012-10-16 DIAGNOSIS — I10 Essential (primary) hypertension: Secondary | ICD-10-CM | POA: Diagnosis not present

## 2012-10-16 DIAGNOSIS — Z1331 Encounter for screening for depression: Secondary | ICD-10-CM | POA: Diagnosis not present

## 2012-10-16 DIAGNOSIS — R5383 Other fatigue: Secondary | ICD-10-CM | POA: Diagnosis not present

## 2012-10-16 DIAGNOSIS — Z0000 Encounter for general adult medical examination without abnormal findings: Secondary | ICD-10-CM | POA: Diagnosis not present

## 2012-10-16 DIAGNOSIS — R5381 Other malaise: Secondary | ICD-10-CM | POA: Diagnosis not present

## 2012-10-16 DIAGNOSIS — G4733 Obstructive sleep apnea (adult) (pediatric): Secondary | ICD-10-CM | POA: Diagnosis not present

## 2012-10-16 DIAGNOSIS — M109 Gout, unspecified: Secondary | ICD-10-CM | POA: Diagnosis not present

## 2012-11-05 DIAGNOSIS — R079 Chest pain, unspecified: Secondary | ICD-10-CM | POA: Diagnosis not present

## 2012-11-08 ENCOUNTER — Other Ambulatory Visit: Payer: Self-pay | Admitting: Neurosurgery

## 2012-11-08 DIAGNOSIS — D497 Neoplasm of unspecified behavior of endocrine glands and other parts of nervous system: Secondary | ICD-10-CM

## 2012-11-12 ENCOUNTER — Ambulatory Visit
Admission: RE | Admit: 2012-11-12 | Discharge: 2012-11-12 | Disposition: A | Discharge status: Home / Self Care | Payer: Medicare Other | Source: Ambulatory Visit | Attending: Neurosurgery | Admitting: Neurosurgery

## 2012-11-12 DIAGNOSIS — G959 Disease of spinal cord, unspecified: Secondary | ICD-10-CM | POA: Diagnosis not present

## 2012-11-12 DIAGNOSIS — D497 Neoplasm of unspecified behavior of endocrine glands and other parts of nervous system: Secondary | ICD-10-CM

## 2012-11-12 MED ORDER — GADOBENATE DIMEGLUMINE 529 MG/ML IV SOLN
20.0000 mL | Freq: Once | INTRAVENOUS | Status: AC | PRN
  Administered 2012-11-12: 20 mL via INTRAVENOUS

## 2012-11-19 DIAGNOSIS — D497 Neoplasm of unspecified behavior of endocrine glands and other parts of nervous system: Secondary | ICD-10-CM | POA: Diagnosis not present

## 2012-11-19 DIAGNOSIS — M4714 Other spondylosis with myelopathy, thoracic region: Secondary | ICD-10-CM | POA: Diagnosis not present

## 2012-11-30 ENCOUNTER — Ambulatory Visit (INDEPENDENT_AMBULATORY_CARE_PROVIDER_SITE_OTHER): Payer: Medicare Other | Admitting: Cardiovascular Disease

## 2012-11-30 ENCOUNTER — Ambulatory Visit: Payer: Medicare Other | Admitting: Cardiovascular Disease

## 2012-11-30 ENCOUNTER — Encounter: Payer: Self-pay | Admitting: Cardiovascular Disease

## 2012-11-30 VITALS — BP 122/70 | HR 62 | Ht 70.0 in | Wt 214.0 lb

## 2012-11-30 DIAGNOSIS — I10 Essential (primary) hypertension: Secondary | ICD-10-CM

## 2012-11-30 DIAGNOSIS — G4730 Sleep apnea, unspecified: Secondary | ICD-10-CM | POA: Diagnosis not present

## 2012-11-30 DIAGNOSIS — I2510 Atherosclerotic heart disease of native coronary artery without angina pectoris: Secondary | ICD-10-CM | POA: Diagnosis not present

## 2012-11-30 NOTE — Patient Instructions (Signed)
Your physician recommends that you schedule a follow-up appointment with Dr. Claiborne Billings as needed for your sleep apnea.

## 2012-12-01 ENCOUNTER — Encounter: Payer: Self-pay | Admitting: Cardiovascular Disease

## 2012-12-01 NOTE — Progress Notes (Signed)
Patient ID: [name], male   DOB: Dec 08, 1935, 77 y.o.   MRN: [id number]   HPI: [name], is a 77 y.o. male presents for sleep clinic in followup of his recent new [employer] Seroventilation unit for his complex obstructive sleep apnea.  [name] is a 66 year gentleman who is a former patient of doctors [name] and now sees [name]. He has known coronary artery disease, hypertension, hyperlipidemia, prostate cancer and recently was found to have a tumor in his thoracic spine which is felt to be benign but is causing some neurologic symptoms. He does have complex sleep apnea I had seen him in March 2014 and at that time he was having difficulty with his old VPAP unit from 2007. He subsequently has been able to obtain a new machine to his prior machine failure appear apparently, this has been set as auto unit with a minimum CPAP pressure for a maximum CPAP pressure of 15 minimum pressure support of 3 and maximum pressure support of 20. At times he states he's still not getting enough air. His mask was changed to a full face mask quadrant. He typically goes to bed at 11 PM and wakes up between 8 and 9 in the morning. He typically goes to the bathroom approximately one to 2 times per night. He still notes some residual daytime sleepiness the oftentimes he can take a nap for just 20 minutes and flow restored during the day. He denies any objective hallucinations. Denies any episodes of cataplexy. There is no history of narcolepsy. He denies recent chest pain. He denies awareness of tachycardia palpitations.   Epworth Sleepiness Scale: Situation   Chance of Dozing/Sleeping (0 = never , 1 = slight chance , 2 = moderate chance , 3 = high chance )   sitting and reading 3   watching TV 2   sitting inactive in a public place 3   being a passenger in a motor vehicle for an hour or more 3   lying down in the afternoon 3   sitting and talking to someone 0   sitting quietly after lunch (no alcohol) 2     while stopped for a few minutes in traffic as the driver 0   Total Score  16    Past Medical History  Diagnosis Date  . History of prostate cancer 2004--  S/P SEED IMPLANTS     NO RECURRENCE  . S/P primary angioplasty with coronary stent 2006--  X2 DE STENTS  . Urgency of urination   . Frequency of urination   . H/O: gout STABLE  . Impaired hearing RIGHT HEARING AID  . Hydrocele, left   . History of melanoma excision 2010    FOREHEAD  . Coronary artery disease CARDIOLOGIST-  [name]    Myoview March 2014: No skin or infarction  . LVH (left ventricular hypertrophy) due to hypertensive disease MODERATE  . Hyperlipidemia   . Hypertension   . Colon polyps 05/10/2005    Hyperplastic polyps  . Depression   . OSA on CPAP     bipap  . Heart murmur     hx  . Cancer     prostate , melanoma head  . Arthritis   . Shingles 07  . Gout     Past Surgical History  Procedure Laterality Date  . Prostate palladium gold seed implants (118)  11-15-2002    PROSTATE CANCER  . Excision melanoma from forehead  2010  . Coronary  angioplasty with stent placement  09-01-2004  [name]    X2  DRUG-ELUTING STENTS (CYPHER)  DISTAL TO MID  LEFT CIRCUMFLEX  (REDUCTION 75% LESIONS TO 0% RESIDUAL)  . Transthoracic echocardiogram  [facility] [id number]    NORMAL EF/ NORMAL FUNCTION WITH IMPAIRED RELAXATION AND MODERATE CONCENTRIC LVH LIKELY HYPERTENSIVE DISEASE  . Hydrocele excision  10/14/2011    Procedure: HYDROCELECTOMY ADULT;  Surgeon: Charles Rud, MD;  Location: [facility];  Service: Urology;  Laterality: Left;  . Circumsision    . Laminectomy N/A 07/13/2012    Procedure: THORACIC LAMINECTOMY FOR RESECTION OF INTRAMEDULLARY SPINAL CHORD TUMOR WITH SPINAL CHORD MONITORING;  Surgeon: Charles Levine, MD;  Location: [place] [facility];  Service: Neurosurgery;  Laterality: N/A;  Thoracic laminectomy for resection of intramedullary spinal Charles tumor with spinal Charles monitering and Charles.  Christella [name] to assist    Allergies  Allergen Reactions  . Betadine [Povidone Iodine] Anaphylaxis  . Contrast Media [Iodinated Diagnostic Agents] Anaphylaxis  . Shellfish Allergy Anaphylaxis    Current Outpatient Prescriptions  Medication Sig Dispense Refill  . acetaminophen (TYLENOL) 500 MG tablet Take 1,000 mg by mouth every 6 (six) hours as needed. For pain.      Marland Kitchen allopurinol (ZYLOPRIM) 300 MG tablet Take 300 mg by mouth daily.       Marland Kitchen amLODipine (NORVASC) 10 MG tablet Take 10 mg by mouth daily.       Marland Kitchen aspirin EC 81 MG tablet Take 81 mg by mouth daily.      Marland Kitchen atorvastatin (LIPITOR) 20 MG tablet Take 20 mg by mouth daily.      Marland Kitchen escitalopram (LEXAPRO) 10 MG tablet Take 10 mg by mouth daily.       Marland Kitchen ezetimibe (ZETIA) 10 MG tablet Take 1 tablet (10 mg total) by mouth daily.  90 tablet  3  . Hydrocodone-Acetaminophen 2.5-325 MG TABS Take 1 tablet by mouth every 4 (four) hours. For pain      . oxybutynin (DITROPAN XL) 15 MG 24 hr tablet Take 15 mg by mouth at bedtime.      . rosuvastatin (CRESTOR) 20 MG tablet Take 20 mg by mouth daily.      . tamsulosin (FLOMAX) 0.4 MG CAPS Take 0.4 mg by mouth at bedtime.      . valsartan (DIOVAN) 80 MG tablet Take 80 mg by mouth daily.       Marland Kitchen BOOSTRIX 5-2.5-18.5 injection       . clopidogrel (PLAVIX) 75 MG tablet Take 1 tablet by mouth daily.      Marland Kitchen ZOSTAVAX 24401 UNT/0.65ML injection        No current facility-administered medications for this visit.    He is married, father of 2, grandfather is 24. He does not routinely exercise. No tobacco or alcohol use. He doesn't can still work in [employer].  ROS negative for fever, chills or night sweats. He denies chest pain. He denies palpitations. He denies wheezing. He does have problems now related to a tumor at T10-11 in the spinal Charles. He does have numbness with defecation. He typically takes daytime naps but admits that he's been doing this for over 40 years. He denies claudication. He denies  restless legs. He is unaware bruxism. He denies breakthrough snoring. Other system review is negative.  PE BP 122/70  Pulse 62  Ht 5\' 10"  (1.778 m)  Wt 214 lb (97.07 kg)  BMI 30.71 kg/m2  General: Alert, oriented, no distress.  Skin: normal turgor,  no rashes HEENT: Normocephalic, atraumatic. Pupils round and reactive; sclera anicteric;  Nose with mild deviated septum Mouth/Parynx benign; Mallinpatti scale 3 Neck: No JVD, no carotid briuts Lungs: clear to ausculatation and percussion; no wheezing or rales Heart: RRR, s1 s2 normal 1/6 systolic murmur Abdomen: soft, nontender; no hepatosplenomehaly, BS+; abdominal aorta nontender and not dilated by palpation. Pulses 2+ Extremities: no clubbinbg cyanosis or edema, Homan's sign negative  Neurologic: grossly nonfocal   I did interrogate Mr. Evener ASV unit. Apparently his 95th percentile BiPAP pressure was 18.1, CPAP pressure at 9.4. AHI 2.6. He does have an increase leak at 20.4. Inspection of his mask reveals that he is missing a part which was causing his height leak. This part was replaced.  LABS:  BMET    Component Value Date/Time   NA 140 10/02/2012 1002   K 3.6 10/02/2012 1002   CL 105 10/02/2012 1002   CO2 27 10/02/2012 1002   GLUCOSE 134* 10/02/2012 1002   BUN 12 10/02/2012 1002   CREATININE 0.97 10/02/2012 1002   CREATININE 1.00 07/06/2012 1050   CALCIUM 8.9 10/02/2012 1002   GFRNONAA 72* 07/06/2012 1050   GFRAA 83* 07/06/2012 1050     Hepatic Function Panel     Component Value Date/Time   PROT 6.1 10/02/2012 1002   ALBUMIN 4.2 10/02/2012 1002   AST 14 10/02/2012 1002   ALT 13 10/02/2012 1002   ALKPHOS 51 10/02/2012 1002   BILITOT 0.6 10/02/2012 1002     CBC    Component Value Date/Time   WBC 6.9 07/06/2012 1050   RBC 4.64 07/06/2012 1050   HGB 13.2 07/06/2012 1050   HCT 38.5* 07/06/2012 1050   PLT 172 07/06/2012 1050   MCV 83.0 07/06/2012 1050   MCH 28.4 07/06/2012 1050   MCHC 34.3 07/06/2012 1050   RDW 14.1 07/06/2012 1050   LYMPHSABS 1.3  03/13/2012 0100   MONOABS 1.1* 03/13/2012 0100   EOSABS 0.1 03/13/2012 0100   BASOSABS 0.0 03/13/2012 0100     BNP No results found for this basename: probnp    Lipid Panel     Component Value Date/Time   TRIG 157* 10/02/2012 1002   LDLCALC 78 10/02/2012 1002     RADIOLOGY: Mr Thoracic Spine W Wo Contrast  11/13/2012   *RADIOLOGY REPORT*  Clinical Data: 77 year old male with history of lower thoracic spine Charles partially cystic partially enhancing tumor status [name] surgery with pathology revealing ependymoma.  History of prostate cancer and melanoma.  Recent recurrence of spinal pain symptoms on the left with bilateral lower extremity numbness and Stadol. Tingling.  BUN and creatinine were obtained on site at [facility] at [address] Results:  BUN 17 mg/dL,  Creatinine 1.0 mg/dL. Estimated GFR 73  MRI THORACIC SPINE WITHOUT AND WITH CONTRAST  Technique:  Multiplanar and multiecho pulse sequences of the thoracic spine were obtained without and with intravenous contrast.  Contrast: 21mL MULTIHANCE GADOBENATE DIMEGLUMINE 529 MG/ML IV SOLN  Comparison: Chest CT without contrast 06/27/2012.  Preoperative thoracic MRI 05/21/2012.  Findings: Limited sagittal imaging of the cervical spine is stable.  Interval postoperative changes to the posterior paraspinal soft tissues and posterior elements T9-T10 through the T12 level. Homogeneously enhancing probable granulation tissue contiguous with the posterior thecal sac.  Postoperative small fluid collection abutting the thecal sac without significant mass effect.  Intramedullary lesion with increased T2 and STIR signal and patchy enhancement re-identified centered at the T10-T11 level is 7 x 8 x 31 mm  today (stable).  The patchy area of postcontrast enhancement today measures 17 mm in length (previously 10 mm).  No definite spinal Charles edema.  No new thoracic or conus spinal Charles signal abnormality.  No other abnormal intradural enhancement. No  thoracic spinal stenosis.  Stable thoracic vertebral height and alignment. No marrow edema or evidence of acute osseous abnormality.  No evidence of osseous metastatic disease.  Stable thoracic paraspinal soft tissues outside of the postoperative changes.  Incidental mid thoracic dermal cyst (probable sebaceous cyst) re-identified.  Decreased but not resolved layering right pleural effusion.  Otherwise stable visualized thoracic and upper abdominal viscera.  IMPRESSION: 1.  Largely unchanged lower thoracic spinal Charles lesion centered at T10-T11.  The enhancing component has increased in the CC dimension, but might in part be postoperative.  No increased Charles expansion or precontrast signal abnormality. 2.  Postoperative changes to the posterior elements and paraspinal soft tissues T9-T10 through T12 with no adverse features. 3.  No new thoracic spine or spinal Charles abnormality. No evidence of osseous metastatic disease. 4.  Decreased but not resolved small layering right pleural effusion.   Original Report Authenticated By: Roselyn Reef, M.D.      ASSESSMENT AND PLAN: Mr. Ezzell does note some improvement since he does have is his new ASV Auto unit. He does have a large leak and this should be corrected with the mask replacement today. We did do a trial in the office today and appeared that he felt like he was getting improved air when he was changed from auto set to a set ASV pressure EPAP 10. He continues to have residual daytime sleepiness.  With his cardiovascular history, I do not feel he would be a candidate for him to take Nuvigil or Provigil.  I will obtain a new download with this change in mode as well as mask correction. He will followup with [name] from a cardiovascular standpoint. I will see her from a sleep perspective as necessary.     Troy Sine, MD, [facility]  12/01/2012 10:56 AM

## 2012-12-20 DIAGNOSIS — H2510 Age-related nuclear cataract, unspecified eye: Secondary | ICD-10-CM | POA: Diagnosis not present

## 2012-12-23 ENCOUNTER — Telehealth: Payer: Self-pay | Admitting: *Deleted

## 2012-12-23 NOTE — Telephone Encounter (Signed)
Faxed back CPAP order supply back.

## 2012-12-26 ENCOUNTER — Other Ambulatory Visit: Payer: Self-pay | Admitting: Neurosurgery

## 2012-12-26 DIAGNOSIS — M4714 Other spondylosis with myelopathy, thoracic region: Secondary | ICD-10-CM | POA: Diagnosis not present

## 2012-12-26 DIAGNOSIS — D497 Neoplasm of unspecified behavior of endocrine glands and other parts of nervous system: Secondary | ICD-10-CM

## 2013-01-02 DIAGNOSIS — Z23 Encounter for immunization: Secondary | ICD-10-CM | POA: Diagnosis not present

## 2013-01-28 ENCOUNTER — Ambulatory Visit
Admission: RE | Admit: 2013-01-28 | Discharge: 2013-01-28 | Disposition: A | Discharge status: Home / Self Care | Payer: Medicare Other | Source: Ambulatory Visit | Attending: Neurosurgery | Admitting: Neurosurgery

## 2013-01-28 DIAGNOSIS — D497 Neoplasm of unspecified behavior of endocrine glands and other parts of nervous system: Secondary | ICD-10-CM

## 2013-01-28 DIAGNOSIS — M549 Dorsalgia, unspecified: Secondary | ICD-10-CM | POA: Diagnosis not present

## 2013-01-28 MED ORDER — GADOBENATE DIMEGLUMINE 529 MG/ML IV SOLN
20.0000 mL | Freq: Once | INTRAVENOUS | Status: AC | PRN
  Administered 2013-01-28: 20 mL via INTRAVENOUS

## 2013-02-08 ENCOUNTER — Telehealth: Payer: Self-pay | Admitting: *Deleted

## 2013-02-08 NOTE — Telephone Encounter (Signed)
Faxed CPAP supply order back to [facility].

## 2013-02-13 DIAGNOSIS — D497 Neoplasm of unspecified behavior of endocrine glands and other parts of nervous system: Secondary | ICD-10-CM | POA: Diagnosis not present

## 2013-02-13 DIAGNOSIS — M4714 Other spondylosis with myelopathy, thoracic region: Secondary | ICD-10-CM | POA: Diagnosis not present

## 2013-03-20 ENCOUNTER — Encounter: Payer: Self-pay | Admitting: Cardiology

## 2013-03-20 ENCOUNTER — Ambulatory Visit (INDEPENDENT_AMBULATORY_CARE_PROVIDER_SITE_OTHER): Payer: Medicare Other | Admitting: Cardiology

## 2013-03-20 VITALS — BP 122/72 | HR 57 | Ht 70.0 in | Wt 217.0 lb

## 2013-03-20 DIAGNOSIS — Z9861 Coronary angioplasty status: Secondary | ICD-10-CM | POA: Diagnosis not present

## 2013-03-20 DIAGNOSIS — E669 Obesity, unspecified: Secondary | ICD-10-CM

## 2013-03-20 DIAGNOSIS — I2510 Atherosclerotic heart disease of native coronary artery without angina pectoris: Secondary | ICD-10-CM | POA: Diagnosis not present

## 2013-03-20 DIAGNOSIS — Z79899 Other long term (current) drug therapy: Secondary | ICD-10-CM | POA: Diagnosis not present

## 2013-03-20 DIAGNOSIS — I10 Essential (primary) hypertension: Secondary | ICD-10-CM | POA: Diagnosis not present

## 2013-03-20 DIAGNOSIS — E785 Hyperlipidemia, unspecified: Secondary | ICD-10-CM | POA: Diagnosis not present

## 2013-03-20 NOTE — Patient Instructions (Signed)
Labs-cmp ,lipdis now and then 6 months from now. labslip will be mailed to you.  Your physician wants you to follow-up in 6 months Dr Ellyn Hack after labs are drawn.  You will receive a reminder letter in the mail two months in advance. If you don't receive a letter, please call our office to schedule the follow-up appointment.

## 2013-03-22 ENCOUNTER — Encounter: Payer: Self-pay | Admitting: Cardiology

## 2013-03-22 DIAGNOSIS — E669 Obesity, unspecified: Secondary | ICD-10-CM | POA: Insufficient documentation

## 2013-03-22 NOTE — Assessment & Plan Note (Signed)
Well-controlled tody. No need to change.

## 2013-03-22 NOTE — Progress Notes (Signed)
PCP: Irven Shelling, MD  Clinic Note: Chief Complaint  Patient presents with  . ROV 6 months    C/o "needle-like" lft sided chest pain, shortness of breath-all the time, edema, numbness on bottom of feet   HPI: [name] is a 77 y.o. male with a Cardiovascular Problem List below who presents today for six-month followup of his coronary disease. 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 He has had some residual decreased sensation in the pelvic area. Preoperatively he was noticing some fatigue and tiredness and was evaluated with stress test that showed no evidence of ischemia..  Interval History: He now returns for followup and still notes fatigue. He also I think is probably somewhat depressed, stating that he was told that the tumor in the spine is now cardiac growing and he may very well need another surgery. The surgeon is concerned that if they do not proceed with surgery this could even be somewhat paralyzed. As is usually the case, he denies any chest tightness or pressure with rest or exertion. He has put on some weight to to lack of motivation. He still goes and travels and tries to do enjoyable activities. This is not overly motivated.  He denies any heart failure symptoms of PND, orthopnea or edema.  He denies any abnormal heartbeats or palpitations. No syncope or near-syncope. No TIA or amaurosis fugax symptoms. No melena, hematochezia hematuria. No nosebleeds. No claudication.  Problem List Items Addressed This Visit   Obesity (BMI 30-39.9) (Chronic)     I did talk about the importance of getting out and try to get some exercise and trying to her this can help his blood pressure and cholesterol as well. PSA told me that his wife are now trying to get back on good diet together. Hopefully she would help him abated and keep exercising. He had lost down to 210 pounds and is now 217 pounds. He told me actually was as heavy  as 225 pounds.    Hyperlipidemia LDL goal <70 (Chronic)     Both Crestor and Lipitor were listed as current medications. He is not taking Lipitor at, nose canceled. Last data labs are not quite at goal. He has been out of Zetia, and noted that it is quite expensive. I gave him the option of going back on the Zetia, or simply increasing Crestor to 40 mg. He was interested in increasing Crestor to 40 mg. We'll recheck labs now and in June. Depending on the LAD looked like at that time we may need to check a NMR panel for clarification.    Relevant Orders      Lipid panel      Comprehensive metabolic panel      Lipid panel      Comprehensive metabolic panel   HTN (hypertension) (Chronic)      Well-controlled tody. No need to change.    Relevant Orders      EKG 12-Lead (Completed)   CAD S/P percutaneous coronary angioplasty: Cypher DES x2 to circumflex - Primary (Chronic)     Essentially no angel symptoms. He is concerned that if his symptoms was what led to his initial PCI. Mild traces of this has more to do with what seems like some depression symptoms with lack of motivation as opposed to a cardiac etiology. He had similar symptoms in March she was evaluated with a negative stress test.  Continues to be on  aspirin and Plavix (For  Cypher stent), Crestor, ARB but no beta blocker due to fatigue and bradycardia at. He is on amlodipine for additional blood pressure control.  Plan: No change.    Relevant Orders      EKG 12-Lead (Completed)    Other Visit Diagnoses   Encounter for long-term (current) use of other medications        Relevant Orders       Lipid panel       Comprehensive metabolic panel       Lipid panel       Comprehensive metabolic panel      PMH Reviewed on Epic Prior Cardiac Evaluation and Past Surgical History: Past Surgical History  Procedure Laterality Date  . Prostate palladium gold seed implants (118)  11-15-2002    PROSTATE CANCER  . Excision melanoma from  forehead  2010  . Coronary angioplasty with stent placement  09-01-2004  DR GAMBLE    X2  DRUG-ELUTING STENTS (CYPHER)  DISTAL TO MID  LEFT CIRCUMFLEX  (REDUCTION 75% LESIONS TO 0% RESIDUAL) 2.5 mm x 20 mm, 2.5 mm x 13 mm (postdilated to 2.75 mm)  . Transthoracic echocardiogram  MAR [id number]    NORMAL EF/ NORMAL FUNCTION WITH IMPAIRED RELAXATION AND MODERATE CONCENTRIC LVH LIKELY HYPERTENSIVE DISEASE  . Hydrocele excision  10/14/2011    Procedure: HYDROCELECTOMY ADULT;  Surgeon: Ailene Rud, MD;  Location: [facility];  Service: Urology;  Laterality: Left;  . Circumsision    . Laminectomy N/A 07/13/2012    Procedure: THORACIC LAMINECTOMY FOR RESECTION OF INTRAMEDULLARY SPINAL CHORD TUMOR WITH SPINAL CHORD MONITORING;  Surgeon: Erline Levine, MD;  Location: [facility];  Service: Neurosurgery;  Laterality: N/A;  Thoracic laminectomy for resection of intramedullary spinal cord tumor with spinal cord monitering and Dr. Christella Noa to assist    Allergies  Allergen Reactions  . Betadine [Povidone Iodine] Anaphylaxis  . Contrast Media [Iodinated Diagnostic Agents] Anaphylaxis  . Shellfish Allergy Anaphylaxis    MEDICATIONS REVIEWED IN EPIC  History   Social History Narrative  . No narrative on file    ROS: A comprehensive Review of Systems - Negative except Malaise and fatigue. Lack of motivation. No arthralgias or myalgias. He still has this in the feet and lack of sensation in the pelvic area including inability to assess when he has completed a bowel movement. He also has very little bowel control or urinary control.  PHYSICAL EXAM BP 122/72  Pulse 57  Ht 5\' 10"  (1.778 m)  Wt 217 lb (98.431 kg)  BMI 31.14 kg/m2 General appearance: alert, cooperative, appears stated age, no distress and mildly obese Neck: no adenopathy, no carotid bruit and no JVD Lungs: clear to auscultation bilaterally, normal percussion bilaterally and non-labored Heart: regular rate and rhythm,  S1, S2 normal, no murmur, click, rub or gallop; nondisplaced PMI Abdomen: soft, non-tender; bowel sounds normal; no masses,  no organomegaly;  Extremities: extremities normal, atraumatic, no cyanosis, or edema  Pulses: 2+ and symmetric;  Neurologic: Mental status: Alert, oriented, thought content appropriate  [id number] today: Yes Rate: 57 , Rhythm:  Sinus bradycardia with PACs;    Recent Labs last cholesterol check in July showed LDL 78 and triglyceride 157     ASSESSMENT / PLAN: CAD S/P percutaneous coronary angioplasty: Cypher DES x2 to circumflex Essentially no angel symptoms. He is concerned that if his symptoms was what led to his initial PCI. Mild traces of this has more to do with what seems  like some depression symptoms with lack of motivation as opposed to a cardiac etiology. He had similar symptoms in March she was evaluated with a negative stress test.  Continues to be on aspirin and Plavix (For  Cypher stent), Crestor, ARB but no beta blocker due to fatigue and bradycardia at. He is on amlodipine for additional blood pressure control.  Plan: No change.  HTN (hypertension)  Well-controlled tody. No need to change.  Hyperlipidemia LDL goal <70 Both Crestor and Lipitor were listed as current medications. He is not taking Lipitor at, nose canceled. Last data labs are not quite at goal. He has been out of Zetia, and noted that it is quite expensive. I gave him the option of going back on the Zetia, or simply increasing Crestor to 40 mg. He was interested in increasing Crestor to 40 mg. We'll recheck labs now and in June. Depending on the LAD looked like at that time we may need to check a NMR panel for clarification.  Obesity (BMI 30-39.9) I did talk about the importance of getting out and try to get some exercise and trying to her this can help his blood pressure and cholesterol as well. PSA told me that his wife are now trying to get back on good diet together. Hopefully she  would help him abated and keep exercising. He had lost down to 210 pounds and is now 217 pounds. He told me actually was as heavy as 225 pounds.    Orders Placed This Encounter  Procedures  . Lipid panel    Order Specific Question:  Has the patient fasted?    Answer:  Yes  . Comprehensive metabolic panel    Order Specific Question:  Has the patient fasted?    Answer:  Yes  . Lipid panel    Standing Status: Future     Number of Occurrences:      Standing Expiration Date: 03/20/2014    Order Specific Question:  Has the patient fasted?    Answer:  Yes  . Comprehensive metabolic panel    Standing Status: Future     Number of Occurrences:      Standing Expiration Date: 03/20/2014    Order Specific Question:  Has the patient fasted?    Answer:  Yes  . EKG 12-Lead   No orders of the defined types were placed in this encounter.    Followup: 6 months  Sherley Mckenney W. Ellyn Hack, M.D., M.S. [facility] [address] [place], [place]  [address]  [phone] Pager # [phone]

## 2013-03-22 NOTE — Assessment & Plan Note (Addendum)
I did talk about the importance of getting out and try to get some exercise and trying to her this can help his blood pressure and cholesterol as well. PSA told me that his wife are now trying to get back on good diet together. Hopefully she would help him abated and keep exercising. He had lost down to 210 pounds and is now 217 pounds. He told me actually was as heavy as 225 pounds.

## 2013-03-22 NOTE — Assessment & Plan Note (Addendum)
Both Crestor and Lipitor were listed as current medications. He is not taking Lipitor at, nose canceled. Last data labs are not quite at goal. He has been out of Zetia, and noted that it is quite expensive. I gave him the option of going back on the Zetia, or simply increasing Crestor to 40 mg. He was interested in increasing Crestor to 40 mg. We'll recheck labs now and in June. Depending on the LAD looked like at that time we may need to check a NMR panel for clarification.

## 2013-03-22 NOTE — Assessment & Plan Note (Signed)
Essentially no angel symptoms. He is concerned that if his symptoms was what led to his initial PCI. Mild traces of this has more to do with what seems like some depression symptoms with lack of motivation as opposed to a cardiac etiology. He had similar symptoms in March she was evaluated with a negative stress test.  Continues to be on aspirin and Plavix (For  Cypher stent), Crestor, ARB but no beta blocker due to fatigue and bradycardia at. He is on amlodipine for additional blood pressure control.  Plan: No change.

## 2013-04-15 ENCOUNTER — Telehealth: Payer: Self-pay | Admitting: Cardiology

## 2013-04-15 NOTE — Telephone Encounter (Signed)
Pt called back and verified x 2.  Pt informed he can present to [facility] lab to have drawn as orders are in the computer system.  Pt verbalized understanding and agreed w/ plan.

## 2013-04-15 NOTE — Telephone Encounter (Signed)
Needs lab order form  (lost one given to him about a month ago )  Please call

## 2013-04-15 NOTE — Telephone Encounter (Signed)
Returned call.  Left message to call back before 4pm.  

## 2013-04-16 DIAGNOSIS — E782 Mixed hyperlipidemia: Secondary | ICD-10-CM | POA: Diagnosis not present

## 2013-04-16 DIAGNOSIS — Z79899 Other long term (current) drug therapy: Secondary | ICD-10-CM | POA: Diagnosis not present

## 2013-04-17 LAB — COMPREHENSIVE METABOLIC PANEL
ALT: 17 U/L (ref 0–53)
AST: 16 U/L (ref 0–37)
Albumin: 4 g/dL (ref 3.5–5.2)
Alkaline Phosphatase: 50 U/L (ref 39–117)
BUN: 18 mg/dL (ref 6–23)
CO2: 28 mEq/L (ref 19–32)
Calcium: 8.8 mg/dL (ref 8.4–10.5)
Chloride: 105 mEq/L (ref 96–112)
Creat: 0.92 mg/dL (ref 0.50–1.35)
Glucose, Bld: 123 mg/dL — ABNORMAL HIGH (ref 70–99)
Potassium: 3.9 mEq/L (ref 3.5–5.3)
Sodium: 142 mEq/L (ref 135–145)
Total Bilirubin: 0.4 mg/dL (ref 0.3–1.2)
Total Protein: 6.4 g/dL (ref 6.0–8.3)

## 2013-04-17 LAB — LIPID PANEL
Cholesterol: 112 mg/dL (ref 0–200)
HDL: 31 mg/dL — ABNORMAL LOW (ref >39)
LDL Cholesterol: 52 mg/dL (ref 0–99)
Total CHOL/HDL Ratio: 3.6 Ratio
Triglycerides: 143 mg/dL (ref <150)
VLDL: 29 mg/dL (ref 0–40)

## 2013-04-17 NOTE — Progress Notes (Signed)
Quick Note:  Triglycerides & LDL look better. Glucose still a bit high, but better. Otherwise normal labs.  Leonie Man, MD  ______

## 2013-04-18 DIAGNOSIS — I10 Essential (primary) hypertension: Secondary | ICD-10-CM | POA: Diagnosis not present

## 2013-04-18 DIAGNOSIS — R7309 Other abnormal glucose: Secondary | ICD-10-CM | POA: Diagnosis not present

## 2013-04-18 DIAGNOSIS — E785 Hyperlipidemia, unspecified: Secondary | ICD-10-CM | POA: Diagnosis not present

## 2013-04-18 DIAGNOSIS — R5383 Other fatigue: Secondary | ICD-10-CM | POA: Diagnosis not present

## 2013-04-18 DIAGNOSIS — G4733 Obstructive sleep apnea (adult) (pediatric): Secondary | ICD-10-CM | POA: Diagnosis not present

## 2013-04-18 DIAGNOSIS — R5381 Other malaise: Secondary | ICD-10-CM | POA: Diagnosis not present

## 2013-04-18 DIAGNOSIS — M109 Gout, unspecified: Secondary | ICD-10-CM | POA: Diagnosis not present

## 2013-04-18 DIAGNOSIS — IMO0002 Reserved for concepts with insufficient information to code with codable children: Secondary | ICD-10-CM | POA: Diagnosis not present

## 2013-04-18 DIAGNOSIS — Z23 Encounter for immunization: Secondary | ICD-10-CM | POA: Diagnosis not present

## 2013-04-19 ENCOUNTER — Encounter: Payer: Self-pay | Admitting: *Deleted

## 2013-04-19 ENCOUNTER — Telehealth: Payer: Self-pay | Admitting: *Deleted

## 2013-04-19 NOTE — Telephone Encounter (Signed)
Message copied by Raiford Simmonds on Fri Apr 19, 2013  1:51 PM ------      Message from: Leonie Man      Created: Wed Apr 17, 2013  2:03 AM       Triglycerides & LDL look better.  Glucose still a bit high, but better.  Otherwise normal labs.            Leonie Man, MD       ------

## 2013-04-19 NOTE — Telephone Encounter (Signed)
Spoke to patient. Result given . Verbalized understanding  Request a copy. Letter will be sent.   And copy sent to PCP-JOHN GRIFFIN

## 2013-05-02 ENCOUNTER — Telehealth: Payer: Self-pay | Admitting: *Deleted

## 2013-05-02 NOTE — Telephone Encounter (Signed)
Faxed CPAP supply order.

## 2013-05-15 DIAGNOSIS — D497 Neoplasm of unspecified behavior of endocrine glands and other parts of nervous system: Secondary | ICD-10-CM | POA: Diagnosis not present

## 2013-05-15 DIAGNOSIS — I10 Essential (primary) hypertension: Secondary | ICD-10-CM | POA: Diagnosis not present

## 2013-05-15 DIAGNOSIS — M4714 Other spondylosis with myelopathy, thoracic region: Secondary | ICD-10-CM | POA: Diagnosis not present

## 2013-05-15 DIAGNOSIS — E669 Obesity, unspecified: Secondary | ICD-10-CM | POA: Diagnosis not present

## 2013-06-11 DIAGNOSIS — E119 Type 2 diabetes mellitus without complications: Secondary | ICD-10-CM | POA: Diagnosis not present

## 2013-07-25 DIAGNOSIS — R21 Rash and other nonspecific skin eruption: Secondary | ICD-10-CM | POA: Diagnosis not present

## 2013-07-26 DIAGNOSIS — L570 Actinic keratosis: Secondary | ICD-10-CM | POA: Diagnosis not present

## 2013-07-26 DIAGNOSIS — Z85820 Personal history of malignant melanoma of skin: Secondary | ICD-10-CM | POA: Diagnosis not present

## 2013-07-26 DIAGNOSIS — L821 Other seborrheic keratosis: Secondary | ICD-10-CM | POA: Diagnosis not present

## 2013-07-26 DIAGNOSIS — L259 Unspecified contact dermatitis, unspecified cause: Secondary | ICD-10-CM | POA: Diagnosis not present

## 2013-08-01 ENCOUNTER — Other Ambulatory Visit: Payer: Self-pay | Admitting: Neurosurgery

## 2013-08-01 DIAGNOSIS — M4714 Other spondylosis with myelopathy, thoracic region: Secondary | ICD-10-CM

## 2013-08-06 ENCOUNTER — Telehealth: Payer: Self-pay | Admitting: *Deleted

## 2013-08-06 DIAGNOSIS — E785 Hyperlipidemia, unspecified: Secondary | ICD-10-CM

## 2013-08-06 DIAGNOSIS — Z79899 Other long term (current) drug therapy: Secondary | ICD-10-CM

## 2013-08-06 NOTE — Telephone Encounter (Signed)
Mail letter and lab slip. CMP,LIPID.

## 2013-08-06 NOTE — Telephone Encounter (Signed)
Message copied by Raiford Simmonds on Tue Aug 06, 2013 10:08 AM ------      Message from: [place], [place]: Wed Mar 20, 2013 11:56 AM       labslip lipid ,cmp mail in may [id number] labs in June 2015 ------

## 2013-08-07 ENCOUNTER — Ambulatory Visit
Admission: RE | Admit: 2013-08-07 | Discharge: 2013-08-07 | Disposition: A | Discharge status: Home / Self Care | Payer: Medicare Other | Source: Ambulatory Visit | Attending: Neurosurgery | Admitting: Neurosurgery

## 2013-08-07 DIAGNOSIS — M4714 Other spondylosis with myelopathy, thoracic region: Secondary | ICD-10-CM

## 2013-08-07 DIAGNOSIS — C412 Malignant neoplasm of vertebral column: Secondary | ICD-10-CM | POA: Diagnosis not present

## 2013-08-07 MED ORDER — GADOBENATE DIMEGLUMINE 529 MG/ML IV SOLN
20.0000 mL | Freq: Once | INTRAVENOUS | Status: AC | PRN
  Administered 2013-08-07: 20 mL via INTRAVENOUS

## 2013-08-14 ENCOUNTER — Encounter: Payer: Medicare Other | Attending: Internal Medicine

## 2013-08-14 VITALS — Ht 69.5 in | Wt 214.0 lb

## 2013-08-14 DIAGNOSIS — Z713 Dietary counseling and surveillance: Secondary | ICD-10-CM | POA: Diagnosis not present

## 2013-08-14 DIAGNOSIS — E119 Type 2 diabetes mellitus without complications: Secondary | ICD-10-CM | POA: Insufficient documentation

## 2013-08-18 NOTE — Progress Notes (Signed)
Patient was seen on 08/14/13 for the first of a series of three diabetes self-management courses at [facility].  Current HbA1c: 7.1%  The following learning objectives were met by the patient during this class:  Describe diabetes  State some common risk factors for diabetes  Defines the role of glucose and insulin  Identifies type of diabetes and pathophysiology  Describe the relationship between diabetes and cardiovascular risk  State the members of the Healthcare Team  States the rationale for glucose monitoring  State when to test glucose  State their individual Target Range  State the importance of logging glucose readings  Describe how to interpret glucose readings  Identifies A1C target  Explain the correlation between A1c and eAG values  State symptoms and treatment of high blood glucose  State symptoms and treatment of low blood glucose  Explain proper technique for glucose testing  Identifies proper sharps disposal  Handouts given during class include:  Living Well with Diabetes book  Carb Counting and Meal Planning book  Meal Plan Card  Carbohydrate guide  Meal planning worksheet  Low Sodium Flavoring Tips  The diabetes portion plate  P6P to eAG Conversion Chart  Diabetes Medications  Diabetes Recommended Care Schedule  Support Group  Diabetes Success Plan  Core Class Satisfaction Survey  Follow-Up Plan:  Attend core 2

## 2013-08-19 DIAGNOSIS — I10 Essential (primary) hypertension: Secondary | ICD-10-CM | POA: Diagnosis not present

## 2013-08-19 DIAGNOSIS — M4714 Other spondylosis with myelopathy, thoracic region: Secondary | ICD-10-CM | POA: Diagnosis not present

## 2013-08-19 DIAGNOSIS — D497 Neoplasm of unspecified behavior of endocrine glands and other parts of nervous system: Secondary | ICD-10-CM | POA: Diagnosis not present

## 2013-08-19 DIAGNOSIS — Z6831 Body mass index (BMI) 31.0-31.9, adult: Secondary | ICD-10-CM | POA: Diagnosis not present

## 2013-08-21 ENCOUNTER — Ambulatory Visit: Payer: Medicare Other

## 2013-08-27 DIAGNOSIS — E119 Type 2 diabetes mellitus without complications: Secondary | ICD-10-CM

## 2013-08-27 NOTE — Progress Notes (Signed)

## 2013-08-28 ENCOUNTER — Ambulatory Visit: Payer: Medicare Other

## 2013-08-28 ENCOUNTER — Other Ambulatory Visit: Payer: Self-pay | Admitting: Neurosurgery

## 2013-09-03 ENCOUNTER — Encounter: Payer: Medicare Other | Attending: Internal Medicine

## 2013-09-03 DIAGNOSIS — Z713 Dietary counseling and surveillance: Secondary | ICD-10-CM | POA: Diagnosis not present

## 2013-09-03 DIAGNOSIS — E119 Type 2 diabetes mellitus without complications: Secondary | ICD-10-CM | POA: Diagnosis not present

## 2013-09-04 NOTE — Progress Notes (Signed)
Patient was seen on 09/03/13 for the third of a series of three diabetes self-management courses at the [facility]. The following learning objectives were met by the patient during this class:    State the amount of activity recommended for healthy living   Describe activities suitable for individual needs   Identify ways to regularly incorporate activity into daily life   Identify barriers to activity and ways to over come these barriers  Identify diabetes medications being personally used and their primary action for lowering glucose and possible side effects   Describe role of stress on blood glucose and develop strategies to address psychosocial issues   Identify diabetes complications and ways to prevent them  Explain how to manage diabetes during illness   Evaluate success in meeting personal goal   Establish 2-3 goals that they will plan to diligently work on until they return for the  85-monthfollow-up visit  Goals:  Follow Diabetes Meal Plan as instructed  Aim for 15-30 mins of physical activity daily as tolerated  Bring food record and glucose log to your follow up visit  Your patient has established the following 4 month goals in their individualized success plan: I will count my carb choices at most meals and snacks I will increase my activity level at least 5 days a week Reduce fat in my diet b eating less desserts  Your patient has identified these potential barriers to change:  Lack of motivation  Surgery in July-Spinal tumor   Your patient has identified their diabetes self-care support plan as  [facility]  Family support  Plan:  Attend Core 4 in 4 months

## 2013-09-12 DIAGNOSIS — C720 Malignant neoplasm of spinal cord: Secondary | ICD-10-CM | POA: Diagnosis not present

## 2013-09-12 DIAGNOSIS — R209 Unspecified disturbances of skin sensation: Secondary | ICD-10-CM | POA: Diagnosis not present

## 2013-09-12 DIAGNOSIS — D432 Neoplasm of uncertain behavior of brain, unspecified: Secondary | ICD-10-CM | POA: Diagnosis not present

## 2013-09-12 DIAGNOSIS — D434 Neoplasm of uncertain behavior of spinal cord: Secondary | ICD-10-CM | POA: Diagnosis not present

## 2013-09-16 ENCOUNTER — Ambulatory Visit (INDEPENDENT_AMBULATORY_CARE_PROVIDER_SITE_OTHER): Payer: Medicare Other | Admitting: Cardiology

## 2013-09-16 ENCOUNTER — Encounter: Payer: Self-pay | Admitting: Cardiology

## 2013-09-16 VITALS — BP 150/68 | HR 56 | Ht 69.5 in | Wt 213.0 lb

## 2013-09-16 DIAGNOSIS — G4733 Obstructive sleep apnea (adult) (pediatric): Secondary | ICD-10-CM

## 2013-09-16 DIAGNOSIS — I5189 Other ill-defined heart diseases: Secondary | ICD-10-CM

## 2013-09-16 DIAGNOSIS — I10 Essential (primary) hypertension: Secondary | ICD-10-CM

## 2013-09-16 DIAGNOSIS — E785 Hyperlipidemia, unspecified: Secondary | ICD-10-CM

## 2013-09-16 DIAGNOSIS — E669 Obesity, unspecified: Secondary | ICD-10-CM

## 2013-09-16 DIAGNOSIS — I519 Heart disease, unspecified: Secondary | ICD-10-CM | POA: Diagnosis not present

## 2013-09-16 DIAGNOSIS — Z9861 Coronary angioplasty status: Secondary | ICD-10-CM

## 2013-09-16 DIAGNOSIS — I2510 Atherosclerotic heart disease of native coronary artery without angina pectoris: Secondary | ICD-10-CM | POA: Diagnosis not present

## 2013-09-16 DIAGNOSIS — Z9989 Dependence on other enabling machines and devices: Secondary | ICD-10-CM

## 2013-09-16 NOTE — Patient Instructions (Addendum)
     Your physician wants you to follow-up in 1 year  Dr Ellyn Hack. - -30 min appointment.  You will receive a reminder letter in the mail two months in advance. If you don't receive a letter, please call our office to schedule the follow-up appointment.

## 2013-09-22 ENCOUNTER — Encounter: Payer: Self-pay | Admitting: Cardiology

## 2013-09-22 DIAGNOSIS — I5189 Other ill-defined heart diseases: Secondary | ICD-10-CM | POA: Insufficient documentation

## 2013-09-22 NOTE — Assessment & Plan Note (Signed)
He has lost about 4 pounds his last visit, and is back to his weight from last year. Goal is to lose another 10-15 pounds

## 2013-09-22 NOTE — Assessment & Plan Note (Signed)
On Crestor 20 mg. Check was in January 2015. Levels are relatively well controlled. Recheck in January of next year

## 2013-09-22 NOTE — Assessment & Plan Note (Signed)
No active heart failure symptoms. As he is more deconditioned, I would anticipate him having some more exertional dyspnea.

## 2013-09-22 NOTE — Progress Notes (Signed)
PCP: Irven Shelling, MD  Clinic Note: Chief Complaint  Patient presents with  . 6 month visit    no chest pain , no sob , no edema  --back tumor has increased   HPI: [name] is a 78 y.o. male with a Cardiovascular Problem List below who presents today for 40-month followup of his coronary disease.   Since his last visit - MRI evidence suggest that his spinal tumor has increased in size, his NeuroSurgeon here was recommending re-attempt at surgical excision, so [facility].  As I understand it, the consensus was not to operate due to concern with how enmeshed the tumor is with the spinal column.    Interval History:   His major issue currently is anxiety about his back & overall prognosis.  He cannot bear the thought of loss of bowel & bladder control.   As is usually the case, he denies any chest tightness or pressure with rest or exertion. He has put on some weight to to lack of motivation. He still goes and travels and tries to do enjoyable activities. This is not overly motivated.  He denies any heart failure symptoms of PND, orthopnea or edema.  He denies any abnormal heartbeats or palpitations. No syncope or near-syncope. No TIA or amaurosis fugax symptoms. No melena, hematochezia hematuria. No nosebleeds. No claudication.  Problem List Items Addressed This Visit   OSA on CPAP (Chronic)     Stable. Followed by Dr. Shelva Majestic    Obesity (BMI 30-39.9) (Chronic)     He has lost about 4 pounds his last visit, and is back to his weight from last year. Goal is to lose another 10-15 pounds    Hyperlipidemia LDL goal <70 (Chronic)     On Crestor 20 mg. Check was in January 2015. Levels are relatively well controlled. Recheck in January of next year    Relevant Orders      EKG 12-Lead   Essential hypertension (Chronic)     Somewhat elevated today, but has usually been well controlled on current  regimen. Continue with current ARB dose plus amlodipine. If his blood pressure continues to be elevated may need to consider increasing the valsartan    Diastolic dysfunction without heart failure (Chronic)     No active heart failure symptoms. As he is more deconditioned, I would anticipate him having some more exertional dyspnea.    Relevant Orders      EKG 12-Lead   CAD S/P percutaneous coronary angioplasty: Cypher DES x2 to circumflex - Primary (Chronic)     Stable with no active symptoms. On aspirin plus Plavix as well as statin and ARB. Not on beta blocker due to bradycardia.  Has had recent negative stress test. No bleeding concerns.  Continue current Rx    Relevant Orders      EKG 12-Lead     PMH Reviewed on Epic  Prior Cardiac Evaluation and Past Surgical History: Procedure Laterality Date  . Coronary angioplasty with stent placement  09-01-2004      Cypher DES x 2 d-m LCx 2.5 mm x 20 mm & 2.5 mm x 13 mm (~2.75 mm)  . Transthoracic echocardiogram  MAR [id number]    NORMAL EF > 55% / NORMAL FUNCTION WITH IMPAIRED RELAXATION AND MODERATE CONCENTRIC LVH LIKELY HYPERTENSIVE DISEASE  . Nm myoview ltd  [id number]/[id number]    Lexiscan: LOW RISK, mild inferior bowel artifact; No  ischemia or Infarction   Allergies  Allergen Reactions  . Betadine [Povidone Iodine] Anaphylaxis  . Contrast Media [Iodinated Diagnostic Agents] Anaphylaxis  . Shellfish Allergy Anaphylaxis   MEDICATIONS REVIEWED IN EPIC  History   Social History Narrative   He is married, father of 2, grandfather of 65. Does not really get exercise now because   of his back issues. Does not smoke and drinks social alcohol. Works in [employer].    ROS: A comprehensive Review of Systems - Negative except Malaise and fatigue. Lack of motivation. No arthralgias or myalgias. 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  PHYSICAL EXAM BP 150/68  Pulse 56  Ht 5' 9.5" (1.765 m)  Wt 213 lb (96.616 kg)  BMI 31.01 kg/m2 General appearance: alert, cooperative, appears stated age, no distress and mildly obese Neck: no adenopathy, no carotid bruit and no JVD Lungs: clear to auscultation bilaterally, normal percussion bilaterally and non-labored Heart: regular rate and rhythm, S1, S2 normal, no murmur, click, rub or gallop; nondisplaced PMI Abdomen: soft, non-tender; bowel sounds normal; no masses,  no organomegaly;  Extremities: extremities normal, atraumatic, no cyanosis, or edema  Pulses: 2+ and symmetric;  Neurologic: Mental status: Alert, oriented, thought content appropriate  [id number] today: Yes Rate: 56 , Rhythm:  Sinus bradycardia with PACs;    Recent Labs last cholesterol check in July showed LDL 78 and triglyceride 157     ASSESSMENT / PLAN: CAD S/P percutaneous coronary angioplasty: Cypher DES x2 to circumflex Stable with no active symptoms. On aspirin plus Plavix as well as statin and ARB. Not on beta blocker due to bradycardia.  Has had recent negative stress test. No bleeding concerns.  Continue current Rx  Hyperlipidemia LDL goal <70 On Crestor 20 mg. Check was in January 2015. Levels are relatively well controlled. Recheck in January of next year  Essential hypertension Somewhat elevated today, but has usually been well controlled on current regimen. Continue with current ARB dose plus amlodipine. If his blood pressure continues to be elevated may need to consider increasing the valsartan  Diastolic dysfunction without heart failure No active heart failure symptoms. As he is more deconditioned, I would anticipate him having some more exertional dyspnea.  Obesity (BMI 30-39.9) He has lost about 4 pounds his last visit, and is back to his weight from last year. Goal is to lose another 10-15 pounds  OSA on  CPAP Stable. Followed by Dr. Shelva Majestic    Orders Placed This Encounter  Procedures  . EKG 12-Lead   No orders of the defined types were placed in this encounter.    Followup: 6 months  Zakhia Seres W, M.D., M.S. Interventional Cardiologist   Pager # [phone] 09/22/2013

## 2013-09-22 NOTE — Assessment & Plan Note (Signed)
Stable. Followed by Dr. Shelva Majestic

## 2013-09-22 NOTE — Assessment & Plan Note (Addendum)
Stable with no active symptoms. On aspirin plus Plavix as well as statin and ARB. Not on beta blocker due to bradycardia.  Has had recent negative stress test. No bleeding concerns.  Continue current Rx

## 2013-09-22 NOTE — Assessment & Plan Note (Signed)
Somewhat elevated today, but has usually been well controlled on current regimen. Continue with current ARB dose plus amlodipine. If his blood pressure continues to be elevated may need to consider increasing the valsartan

## 2013-09-25 DIAGNOSIS — D497 Neoplasm of unspecified behavior of endocrine glands and other parts of nervous system: Secondary | ICD-10-CM | POA: Diagnosis not present

## 2013-09-25 DIAGNOSIS — Z6831 Body mass index (BMI) 31.0-31.9, adult: Secondary | ICD-10-CM | POA: Diagnosis not present

## 2013-09-25 DIAGNOSIS — I10 Essential (primary) hypertension: Secondary | ICD-10-CM | POA: Diagnosis not present

## 2013-09-25 DIAGNOSIS — M4714 Other spondylosis with myelopathy, thoracic region: Secondary | ICD-10-CM | POA: Diagnosis not present

## 2013-10-01 ENCOUNTER — Telehealth: Payer: Self-pay | Admitting: *Deleted

## 2013-10-01 NOTE — Telephone Encounter (Signed)
Returned CPAP supply order to [facility].

## 2013-10-10 ENCOUNTER — Encounter: Payer: Self-pay | Admitting: *Deleted

## 2013-10-10 ENCOUNTER — Telehealth: Payer: Self-pay | Admitting: *Deleted

## 2013-10-10 NOTE — Telephone Encounter (Signed)
[facility]ed [facility] to [facility]

## 2013-10-14 DIAGNOSIS — E119 Type 2 diabetes mellitus without complications: Secondary | ICD-10-CM | POA: Diagnosis not present

## 2013-10-14 DIAGNOSIS — I10 Essential (primary) hypertension: Secondary | ICD-10-CM | POA: Diagnosis not present

## 2013-10-16 ENCOUNTER — Encounter: Payer: Self-pay | Admitting: Neurology

## 2013-10-16 ENCOUNTER — Ambulatory Visit (INDEPENDENT_AMBULATORY_CARE_PROVIDER_SITE_OTHER): Payer: Medicare Other | Admitting: Neurology

## 2013-10-16 VITALS — BP 130/72 | HR 46 | Resp 18 | Ht 69.5 in | Wt 213.0 lb

## 2013-10-16 DIAGNOSIS — I2510 Atherosclerotic heart disease of native coronary artery without angina pectoris: Secondary | ICD-10-CM

## 2013-10-16 DIAGNOSIS — R209 Unspecified disturbances of skin sensation: Secondary | ICD-10-CM

## 2013-10-16 DIAGNOSIS — Z9861 Coronary angioplasty status: Secondary | ICD-10-CM | POA: Diagnosis not present

## 2013-10-16 DIAGNOSIS — G959 Disease of spinal cord, unspecified: Secondary | ICD-10-CM

## 2013-10-16 DIAGNOSIS — C720 Malignant neoplasm of spinal cord: Secondary | ICD-10-CM

## 2013-10-16 DIAGNOSIS — R202 Paresthesia of skin: Secondary | ICD-10-CM

## 2013-10-16 DIAGNOSIS — R200 Anesthesia of skin: Secondary | ICD-10-CM

## 2013-10-16 LAB — VITAMIN B12: Vitamin B-12: 227 pg/mL (ref 211–911)

## 2013-10-16 LAB — TSH: TSH: 2.92 u[IU]/mL (ref 0.350–4.500)

## 2013-10-16 NOTE — Progress Notes (Signed)
NEUROLOGY CONSULTATION NOTE  [name] MRN: [id number] DOB: [birth year]  Referring provider: Dr. Vertell Limber Primary care provider: Dr. Laurann Montana  Reason for consult:  Intramedullary ependymoma, numbness of lower extremities.  HISTORY OF PRESENT ILLNESS: [name] is a 78 year old right-handed man with history of hypertension, CAD status post percutaneous coronary angioplasty, OSA, prostate cancer status post seed implants, melanoma and hyperlipidemia who presents for spinal cord tumor.  In 2014, he sneezed and fractured his rib.  Following that, he continued to have low-thoracic back pain, wrapping around the right side of his torso.  He denied any lower extremity weakness or sensory level.  He has had numbness in the feet for several years, which was never worked up.  He denied bowel or bladder incontinence or retention.  An MRI of the thoracic spine with and without contrast was performed, which revealed a cystic intramedullary mass at the T10-11 level, associated with enhancing nodule within the cyst, as well as thoracic spondylosis.  A metastatic malignancy was suspected.  He was referred to Dr. Vertell Limber at [place].  He underwent laminectomy and resection of the tumor on 07/13/12, but the cystic cavity was left intact.  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  Following surgery, he developed perineal numbness.  He also has noticed the numbness in the legs have spread to the mid shins.  Repeat MRI from 11/13/12 revealed no new abnormalities.  The lesion was unchanged, with some increased enhancement which may be post-operative.  There was no increased cord expansion or pre-contrast signal abnormality.  MRI from 01/29/13 revealed no change in the cystic cavity, but the cord remains expanded and there is enhancement within the cavity, suggestive of residual or recurrent tumor.  Another follow-up MRI was performed on 08/08/13, revealing that the cyst cavity as expanded, bulging  dorsally.  There was faint residual enhancement, raising concern for recurrent tumor.  He was evaluated at the [facility].  Their opinion was that he has a cyst requiring repeat surgery, but they did not believe that he has recurrent tumor or neoplasm.  They did note tethering of the cord and obstruction of CSF flow.  They recommended follow up visit and MRI in 3 months.  Dr. Vertell Limber has reviewed the imaging and does note tumor recurrence.    There is a question about whether the numbness in the legs are related to the tumor.  If the numbness is related to the tumor, then Dr. Vertell Limber feels another surgery is warranted.  Unfortunately, this has never been worked up or formally monitored.  He has no history of diabetes, but he reports a Hgb A1c over the past year was 7.1.  He worked on his diet for 3 months and most recent value was 6.5.  He is scheduled to have a follow-up MRI on 11/13/13 with follow-up with the surgeons at [facility] the following day.  PAST MEDICAL HISTORY: Past Medical History  Diagnosis Date  . History of prostate cancer 2004--  S/P SEED IMPLANTS     NO RECURRENCE  . S/P primary angioplasty with coronary stent 2006--      X2 DE STENTS - Cypher 2.5 mm postdilated to 2.75 mm (20 mm and 13 mm stents)  . Urgency of urination   . Frequency of urination   . H/O: gout STABLE  . Impaired hearing RIGHT HEARING AID  . Hydrocele, left   . History of melanoma excision 2010    FOREHEAD  . CAD S/P percutaneous  coronary angioplasty CARDIOLOGIST-  DR DAVID [facility]    Myoview March 2014: No skin or infarction  . Diastolic dysfunction without heart failure     Moderate LVH, Gr 1 DD on Echo 2011; no CHF admissions, no tt on diuretisc  . Hyperlipidemia LDL goal <70   . Essential hypertension   . Colon polyps 05/10/2005    Hyperplastic polyps  . Depression   . OSA on CPAP     bipap  . Heart murmur     hx - no notable valvular lesion on Echo  . Cancer     prostate , melanoma head  .  Arthritis   . Shingles 07  . Gout   . Diabetes mellitus type 2 with complications     neuropathy; CAD    PAST SURGICAL HISTORY: Past Surgical History  Procedure Laterality Date  . Prostate palladium gold seed implants (118)  11-15-2002    PROSTATE CANCER  . Excision melanoma from forehead  2010  . Coronary angioplasty with stent placement  09-01-2004      Cypher DES x 2 d-m LCx 2.5 mm x 20 mm & 2.5 mm x 13 mm (~2.75 mm)  . Transthoracic echocardiogram  MAR [id number]    NORMAL EF > 55% / NORMAL FUNCTION WITH IMPAIRED RELAXATION AND MODERATE CONCENTRIC LVH LIKELY HYPERTENSIVE DISEASE  . Hydrocele excision  10/14/2011    Procedure: HYDROCELECTOMY ADULT;  Surgeon: Ailene Rud, MD;  Location: [facility];  Service: Urology;  Laterality: Left;  . Circumsision    . Laminectomy N/A 07/13/2012    Procedure: THORACIC LAMINECTOMY FOR RESECTION OF INTRAMEDULLARY SPINAL CHORD TUMOR WITH SPINAL CHORD MONITORING;  Surgeon: Erline Levine, MD;  Location: [facility];  Service: Neurosurgery;  Laterality: N/A;  Thoracic laminectomy for resection of intramedullary spinal cord tumor with spinal cord monitering and Dr. Christella Noa to assist  . Nm myoview ltd  [id number]    Lexiscan: LOW RISK, mild inferior bowel artifact; No ischemia or Infarction    MEDICATIONS: Current Outpatient Prescriptions on File Prior to Visit  Medication Sig Dispense Refill  . allopurinol (ZYLOPRIM) 300 MG tablet Take 300 mg by mouth daily.       Marland Kitchen amLODipine (NORVASC) 10 MG tablet Take 10 mg by mouth daily.       Marland Kitchen aspirin EC 81 MG tablet Take 81 mg by mouth daily.      . clopidogrel (PLAVIX) 75 MG tablet Take 1 tablet by mouth daily.      Marland Kitchen escitalopram (LEXAPRO) 10 MG tablet Take 10 mg by mouth daily.       Marland Kitchen oxybutynin (DITROPAN XL) 15 MG 24 hr tablet Take 15 mg by mouth at bedtime.      . rosuvastatin (CRESTOR) 20 MG tablet Take 20 mg by mouth daily.      . tamsulosin (FLOMAX) 0.4 MG CAPS Take 0.4 mg by  mouth at bedtime.      . valsartan (DIOVAN) 80 MG tablet Take 80 mg by mouth daily.       Marland Kitchen Brooker 5-2.5-18.5 injection        No current facility-administered medications on file prior to visit.    ALLERGIES: Allergies  Allergen Reactions  . Betadine [Povidone Iodine] Anaphylaxis  . Contrast Media [Iodinated Diagnostic Agents] Anaphylaxis  . Shellfish Allergy Anaphylaxis    FAMILY HISTORY: Family History  Problem Relation Age of Onset  . Hypertension Mother   . Stroke Mother   . Hypertension Father   .  Prostate cancer Father   . Hypertension Brother   . Prostate cancer Brother     SOCIAL HISTORY: History   Social History  . Marital Status: Married    Spouse Name: N/A    Number of Children: N/A  . Years of Education: N/A   Occupational History  . Not on file.   Social History Main Topics  . Smoking status: Never Smoker   . Smokeless tobacco: Never Used     Comment: occ alcohol  . Alcohol Use: No     Comment: OCCASIONAL  . Drug Use: No  . Sexual Activity: Not on file   Other Topics Concern  . Not on file   Social History Narrative   He is married, father of 2, grandfather of 24. Does not really get exercise now because   of his back issues. Does not smoke and drinks social alcohol. Works in [employer].    REVIEW OF SYSTEMS: Constitutional: No fevers, chills, or sweats, no generalized fatigue, change in appetite Eyes: No visual changes, double vision, eye pain Ear, nose and throat: No hearing loss, ear pain, nasal congestion, sore throat Cardiovascular: No chest pain, palpitations Respiratory:  No shortness of breath at rest or with exertion, wheezes GastrointestinaI: No nausea, vomiting, diarrhea, abdominal pain, fecal incontinence Genitourinary:  Some urinary incontinence. Musculoskeletal:  No neck pain, back pain Integumentary: No rash, pruritus, skin lesions Neurological: as above Psychiatric: No depression, insomnia, anxiety Endocrine: No  palpitations, fatigue, diaphoresis, mood swings, change in appetite, change in weight, increased thirst Hematologic/Lymphatic:  No anemia, purpura, petechiae. Allergic/Immunologic: no itchy/runny eyes, nasal congestion, recent allergic reactions, rashes  PHYSICAL EXAM: Filed Vitals:   10/16/13 0858  BP: 130/72  Pulse: 46  Resp: 18   General: No acute distress Head:  Normocephalic/atraumatic Neck: supple, no paraspinal tenderness, full range of motion Back: No paraspinal tenderness Heart: regular rate and rhythm Lungs: Clear to auscultation bilaterally. Vascular: No carotid bruits. Neurological Exam: Mental status: alert and oriented to person, place, and time, recent and remote memory intact, fund of knowledge intact, attention and concentration intact, speech fluent and not dysarthric, language intact. Cranial nerves: CN I: not tested CN II: pupils equal, round and reactive to light, visual fields intact, fundi unremarkable, without vessel changes, exudates, hemorrhages or papilledema. CN III, IV, VI:  full range of motion, no nystagmus, no ptosis CN V: facial sensation intact CN VII: upper and lower face symmetric CN VIII: hearing intact CN IX, X: gag intact, uvula midline CN XI: sternocleidomastoid and trapezius muscles intact CN XII: tongue midline Bulk & Tone: normal, no fasciculations. Motor:  5 out of 5 throughout Sensation: Reduced pinprick and vibration sensation up to just below the knees bilaterally. Reduced proprioception involving the toes. Deep Tendon Reflexes: 1+ throughout, except absent in the ankles. Toes downgoing. Finger to nose testing: No dysmetria Heel to shin: No dysmetria Gait: Normal station and stride. Able to turn, walk on toes, and walk on heels. Was unsteady while walking in tandem. Romberg negative.  IMPRESSION: Intramedullary ependymoma status post surgery. Bilateral lower extremity numbness, reportedly progressed over the past year.    I  agree with Dr. Vertell Limber that there does appear to be evidence of recurrent tumor on the most recent MRI from May.  However, he does not exhibit any clear signs of myelopathy on physical exam.  He has no weakness, increased tone, thoracic sensory level, hyperreflexia, Babinski, or spastic gait.  I would consider a peripheral neuropathy.  It is possible  that he may have had mildly impaired fasting glucose for several years, which worsened with recent increase in Hgb A1c.    PLAN: I would proceed with a peripheral neuropathy workup. 1.  NCV-EMG of lower extremities 2.  Check blood work for B12, TSH, SPEP/IFE, Sed Rate, ANA 3.  Follow up after tests.  60 minutes that with the patient and his wife, over 50% spent discussing possible etiology and coordinating care.  Thank you for allowing me to take part in the care of this patient.  Metta Clines, DO  CC:  Erline Levine, MD  Lavone Orn, MD

## 2013-10-16 NOTE — Patient Instructions (Addendum)
Based on symptoms, I cannot say numbness is related to the spinal cord.  It seems more likely from the peripheral nerves, possibly related to hyperglycemia 1.  We will check nerve conduction study test 2.  We will check B12, TSH, SPEP/IFE, ESR, ANA 3.  Follow up after tests.

## 2013-10-17 LAB — SEDIMENTATION RATE: Sed Rate: 7 mm/hr (ref 0–16)

## 2013-10-17 LAB — ANA: Anti Nuclear Antibody(ANA): NEGATIVE

## 2013-10-18 LAB — SPEP & IFE WITH QIG
Albumin ELP: 59.5 % (ref 55.8–66.1)
Alpha-1-Globulin: 4.7 % (ref 2.9–4.9)
Alpha-2-Globulin: 11.8 % (ref 7.1–11.8)
Beta 2: 5.4 % (ref 3.2–6.5)
Beta Globulin: 6.7 % (ref 4.7–7.2)
Gamma Globulin: 11.9 % (ref 11.1–18.8)
IgA: 128 mg/dL (ref 68–379)
IgG (Immunoglobin G), Serum: 772 mg/dL (ref 650–1600)
IgM, Serum: 43 mg/dL (ref 41–251)
Total Protein, Serum Electrophoresis: 6.5 g/dL (ref 6.0–8.3)

## 2013-10-22 ENCOUNTER — Telehealth: Payer: Self-pay | Admitting: Family Medicine

## 2013-10-22 DIAGNOSIS — R200 Anesthesia of skin: Secondary | ICD-10-CM

## 2013-10-22 DIAGNOSIS — R202 Paresthesia of skin: Secondary | ICD-10-CM

## 2013-10-22 NOTE — Telephone Encounter (Signed)
Spoke with patient. Notified of results and Dr. Georgie Chard recommendations. Patient is agreeable to those. He will start B-12 Injections on Monday 7/27 when he comes in for EMG. I will also place order for blood work and he will have that done on Monday as well when he's done with his appt here.

## 2013-10-22 NOTE — Telephone Encounter (Signed)
Message copied by Taletha Twiford, Arman Bogus on Tue Oct 22, 2013 10:30 AM ------      Message from: JAFFE, ADAM R      Created: Fri Oct 18, 2013  1:48 PM       Mr. Gebo B12 level is in the low normal range (227).  This may still indicate B12 deficiency, which can cause numbness in the lower extremities.  Therefore, I would like to check a methylmalonic acid level.  I would favor treating for B12 deficiency with injections and see if it helps with the numbness.  I would prescribe 1000 mcg IV daily for 7 days, then once a week for 7 days, then once a month for 1 year.  The other tests are unremarkable.      ----- Message -----         From: Lab in [facility] Interface         Sent: 10/18/2013  12:56 PM           To: Dudley Major, DO                   ------

## 2013-10-24 ENCOUNTER — Inpatient Hospital Stay (HOSPITAL_COMMUNITY): Admission: RE | Admit: 2013-10-24 | Payer: Medicare Other | Source: Ambulatory Visit | Admitting: Neurosurgery

## 2013-10-24 ENCOUNTER — Encounter (HOSPITAL_COMMUNITY): Admission: RE | Payer: Self-pay | Source: Ambulatory Visit

## 2013-10-24 SURGERY — THORACIC LAMINECTOMY FOR TUMOR
Anesthesia: General

## 2013-10-28 ENCOUNTER — Ambulatory Visit (INDEPENDENT_AMBULATORY_CARE_PROVIDER_SITE_OTHER): Payer: Medicare Other | Admitting: Neurology

## 2013-10-28 DIAGNOSIS — G959 Disease of spinal cord, unspecified: Secondary | ICD-10-CM | POA: Diagnosis not present

## 2013-10-28 DIAGNOSIS — R200 Anesthesia of skin: Secondary | ICD-10-CM

## 2013-10-28 DIAGNOSIS — C720 Malignant neoplasm of spinal cord: Secondary | ICD-10-CM

## 2013-10-28 DIAGNOSIS — R202 Paresthesia of skin: Secondary | ICD-10-CM

## 2013-10-28 NOTE — Procedures (Signed)
[facility]  [address], [address]  [place], [place] [address] Tel: [phone] Fax:  [phone] Test Date:  10/28/2013  Patient: [name] DOB: 07/16/1935 Physician: Narda Amber, DO  Sex: Male Height: 5' 9.5" Ref Phys: [name]  ID#: [id number] Temp: 33.0C Technician: [name] R. NCS T.   Patient Complaints: [name] is a 78 year old male with a history of intrameduallary ependymoma presenting for evaluation of progressive bilateral feet numbness and gait difficulty.  NCV & EMG Findings: Extensive electrodiagnostic testing of the right lower extremity and additional studies of the left reveals:  1. Bilateral sural and superficial peroneal sensory responses are absent.  2. Bilateral peroneal motor responses recording at the extensor digitorum brevis are markedly reduced, however normal when recording at the tibialis anterior. The tibial motor response shows normal latency and amplitudes, with borderline conduction velocities.  3. Chronic motor axon loss changes are seen affecting the tibialis anterior, medial gastrocnemius, and flexor digitorum longus muscles. There is no evidence of active denervation.   Impression: 1. The electrophysiologic findings are most consistent with a distal and symmetric chronic sensorimotor polyneuropathy, predominantly axon loss in type, affecting bilateral lower extremities. Overall, these findings are moderate in degree electrically. 2. There is no definite evidence of a superimposed lumbosacral radiculopathy.   ___________________________ Narda Amber, DO    Nerve Conduction Studies Anti Sensory Summary Table   Site NR Peak (ms) Norm Peak (ms) P-T Amp (V) Norm P-T Amp  Left Sup Peroneal Anti Sensory (Ant Lat Mall)  12 cm NR  <4.6  >3  Right Sup Peroneal Anti Sensory (Ant Lat Mall)  12 cm NR  <4.6  >3  Left Sural Anti Sensory (Lat Mall)  Calf NR  <4.6  >3  Right Sural Anti Sensory (Lat Mall)  Calf NR  <4.6  >3     Motor Summary Table   Site NR Onset (ms) Norm Onset (ms) O-P Amp (mV) Norm O-P Amp Site1 Site2 Delta-0 (ms) Dist (cm) Vel (m/s) Norm Vel (m/s)  Left Peroneal Motor (Ext Dig Brev)  Ankle    5.5 <6.0 0.6 >2.5 B Fib Ankle 8.7 28.5 33 >40  B Fib    14.2  0.5  Poplt B Fib 2.5 9.5 38 >40  Poplt    16.7  0.5         Right Peroneal Motor (Ext Dig Brev)  Ankle    4.0 <6.0 1.9 >2.5 B Fib Ankle 8.0 31.0 39 >40  B Fib    12.0  1.1  Poplt B Fib 3.0 9.5 32 >40  Poplt    15.0  1.1         Left Peroneal TA Motor (Tib Ant)  Fib Head    2.2 <4.5 5.8 >3 Poplit Fib Head 2.5 10.0 40 >40  Poplit    4.7  4.6         Right Peroneal TA Motor (Tib Ant)  Fib Head    2.7 <4.5 4.2 >3 Poplit Fib Head 1.8 9.5 53 >40  Poplit    4.5  4.0         Left Tibial Motor (Abd Hall Brev)  Ankle    4.8 <6.0 6.7 >4 Knee Ankle 12.1 43.0 36 >40  Knee    16.9  3.7         Right Tibial Motor (Abd Hall Brev)  Ankle    3.8 <6.0 6.8 >4 Knee Ankle 9.8 40.0 41 >40  Knee  13.6  4.9          EMG   Side Muscle Ins Act Fibs Psw Fasc Number Recrt Dur Dur. Amp Amp. Poly Poly. Comment  Right GluteusMed Nml Nml Nml Nml Nml Nml Nml Nml Nml Nml Nml Nml N/A  Right AntTibialis Nml Nml Nml Nml 1- Mod-R Few 1+ Few 1+ Few 1+ N/A  Right Gastroc Nml Nml Nml Nml 1- Mod Few 1+ Nml Nml Nml Nml N/A  Right Flex Dig Long Nml Nml Nml Nml 1- Mod-R Few 1+ Few 1+ Nml Nml N/A  Right RectFemoris Nml Nml Nml Nml Nml Nml Nml Nml Nml Nml Nml Nml N/A  Left AntTibialis Nml Nml Nml Nml 1- Mod Few 1+ Few 1+ Nml Nml N/A  Left Gastroc Nml Nml Nml Nml 1- Mod Few 1+ Few 1+ Nml Nml N/A  Left Flex Dig Long Nml Nml Nml Nml 1- Mod Few 1+ Few 1+ Nml Nml N/A  Left RectFemoris Nml Nml Nml Nml Nml Nml Nml Nml Nml Nml Nml Nml N/A  Left BicepsFemS Nml Nml Nml Nml Nml Nml Nml Nml Nml Nml Nml Nml N/A      Waveforms:

## 2013-10-29 ENCOUNTER — Telehealth: Payer: Self-pay | Admitting: Neurology

## 2013-10-29 NOTE — Telephone Encounter (Signed)
Pt states  Thinks that he is to have b-12 injections [phone]

## 2013-10-29 NOTE — Telephone Encounter (Signed)
Patient will start B12 injection  10/30/13 at 10 am

## 2013-10-30 ENCOUNTER — Other Ambulatory Visit: Payer: Self-pay | Admitting: Neurology

## 2013-10-30 ENCOUNTER — Ambulatory Visit (INDEPENDENT_AMBULATORY_CARE_PROVIDER_SITE_OTHER): Payer: Medicare Other | Admitting: Family Medicine

## 2013-10-30 DIAGNOSIS — R209 Unspecified disturbances of skin sensation: Secondary | ICD-10-CM | POA: Diagnosis not present

## 2013-10-30 DIAGNOSIS — E538 Deficiency of other specified B group vitamins: Secondary | ICD-10-CM

## 2013-10-30 MED ORDER — CYANOCOBALAMIN 1000 MCG/ML IJ SOLN
1000.0000 ug | Freq: Once | INTRAMUSCULAR | Status: AC
  Administered 2013-10-30: 1000 ug via INTRAMUSCULAR

## 2013-10-30 NOTE — Progress Notes (Signed)
Patient here for Vitamin B 12 Injection. Patient tolerated injection well.

## 2013-10-31 ENCOUNTER — Ambulatory Visit (INDEPENDENT_AMBULATORY_CARE_PROVIDER_SITE_OTHER): Payer: Medicare Other | Admitting: Family Medicine

## 2013-10-31 DIAGNOSIS — E538 Deficiency of other specified B group vitamins: Secondary | ICD-10-CM | POA: Diagnosis not present

## 2013-10-31 MED ORDER — CYANOCOBALAMIN 1000 MCG/ML IJ SOLN
1000.0000 ug | Freq: Once | INTRAMUSCULAR | Status: AC
  Administered 2013-10-31: 1000 ug via INTRAMUSCULAR

## 2013-10-31 NOTE — Progress Notes (Signed)
Patient here for B12 injection. ?Patient tolerated well. ?

## 2013-10-31 NOTE — Progress Notes (Deleted)
Subjective:    Patient ID: [name] is a 78 y.o. male.  HPI {Common ambulatory SmartLinks:[id number]}  Review of Systems  Objective:  Neurologic Exam  Physical Exam  Assessment:   ***  Plan:   ***

## 2013-11-01 ENCOUNTER — Ambulatory Visit (INDEPENDENT_AMBULATORY_CARE_PROVIDER_SITE_OTHER): Payer: Medicare Other | Admitting: Neurology

## 2013-11-01 ENCOUNTER — Ambulatory Visit: Payer: Medicare Other

## 2013-11-01 ENCOUNTER — Encounter: Payer: Self-pay | Admitting: Neurology

## 2013-11-01 VITALS — BP 116/70 | HR 57 | Resp 16 | Ht 69.5 in | Wt 217.0 lb

## 2013-11-01 DIAGNOSIS — E538 Deficiency of other specified B group vitamins: Secondary | ICD-10-CM

## 2013-11-01 DIAGNOSIS — G609 Hereditary and idiopathic neuropathy, unspecified: Secondary | ICD-10-CM | POA: Diagnosis not present

## 2013-11-01 DIAGNOSIS — Z9861 Coronary angioplasty status: Secondary | ICD-10-CM | POA: Diagnosis not present

## 2013-11-01 DIAGNOSIS — I2510 Atherosclerotic heart disease of native coronary artery without angina pectoris: Secondary | ICD-10-CM | POA: Diagnosis not present

## 2013-11-01 DIAGNOSIS — C720 Malignant neoplasm of spinal cord: Secondary | ICD-10-CM | POA: Diagnosis not present

## 2013-11-01 LAB — METHYLMALONIC ACID, SERUM: Methylmalonic Acid, Quant: 209 nmol/L (ref 87–318)

## 2013-11-01 MED ORDER — CYANOCOBALAMIN 1000 MCG/ML IJ SOLN
1000.0000 ug | Freq: Once | INTRAMUSCULAR | Status: AC
  Administered 2013-11-01: 1000 ug via INTRAMUSCULAR

## 2013-11-01 NOTE — Progress Notes (Signed)
NEUROLOGY FOLLOW UP OFFICE NOTE  [name] [id number]  HISTORY OF PRESENT ILLNESS: [name] is a 78 year old right-handed man with history of hypertension, CAD status post percutaneous coronary angioplasty, OSA, prostate cancer status post seed implants, melanoma and hyperlipidemia who follows up to discuss NCV and labs related to bilateral lower extremity numbness and intramedullary ependymoma.  UPDATE: He had a NCV-EMG performed 10/28/13, which revealed a distal and symmetric chronic sensorimotor polyneuropathy, predominantly axonal type.  10/16/13 LABS:  ANA negative, Sed Rate 7, B12 227, TSH 2.920, SPEP/IFE unremarkable.  Due to the low B12, he was just started on B12 injections.  HISTORY: In 2014, he sneezed and fractured his rib.  Following that, he continued to have low-thoracic back pain, wrapping around the right side of his torso.  He denied any lower extremity weakness or sensory level.  He has had numbness in the feet for several years, which was never worked up.  He denied bowel or bladder incontinence or retention.  An MRI of the thoracic spine with and without contrast was performed, which revealed a cystic intramedullary mass at the T10-11 level, associated with enhancing nodule within the cyst, as well as thoracic spondylosis.  A metastatic malignancy was suspected.  He was referred to Dr. Vertell Limber at [facility].  He underwent laminectomy and resection of the tumor on 07/13/12, but the cystic cavity was left intact.  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  Following surgery, he developed perineal numbness.  He also has noticed the numbness in the legs have spread to the mid shins.  Repeat MRI from 11/13/12 revealed no new abnormalities.  The lesion was unchanged, with some increased enhancement which may be post-operative.  There was no increased cord expansion or pre-contrast signal abnormality.  MRI from 01/29/13 revealed no change in the cystic cavity,  but the cord remains expanded and there is enhancement within the cavity, suggestive of residual or recurrent tumor.  Another follow-up MRI was performed on 08/08/13, revealing that the cyst cavity as expanded, bulging dorsally.  There was faint residual enhancement, raising concern for recurrent tumor.  He was evaluated at the [facility].  Their opinion was that he has a cyst requiring repeat surgery, but they did not believe that he has recurrent tumor or neoplasm.  They did note tethering of the cord and obstruction of CSF flow.  They recommended follow up visit and MRI in 3 months.  Dr. Vertell Limber has reviewed the imaging and does note tumor recurrence.    There is a question about whether the numbness in the legs are related to the tumor.  If the numbness is related to the tumor, then Dr. Vertell Limber feels another surgery is warranted.  Unfortunately, this has never been worked up or formally monitored.  He has no history of diabetes, but he reports a Hgb A1c over the past year was 7.1.  He worked on his diet for 3 months and most recent value was 6.5.  He is scheduled to have a follow-up MRI on 11/13/13 with follow-up with the surgeons at [facility] the following day.  PAST MEDICAL HISTORY: Past Medical History  Diagnosis Date  . History of prostate cancer 2004--  S/P SEED IMPLANTS     NO RECURRENCE  . S/P primary angioplasty with coronary stent 2006--      X2 DE STENTS - Cypher 2.5 mm postdilated to 2.75 mm (20 mm and 13 mm stents)  . Urgency of urination   .  Frequency of urination   . H/O: gout STABLE  . Impaired hearing RIGHT HEARING AID  . Hydrocele, left   . History of melanoma excision 2010    FOREHEAD  . CAD S/P percutaneous coronary angioplasty CARDIOLOGIST-  DR DAVID [facility]    Myoview March 2014: No skin or infarction  . Diastolic dysfunction without heart failure     Moderate LVH, Gr 1 DD on Echo 2011; no CHF admissions, no tt on diuretisc  . Hyperlipidemia LDL goal <70   . Essential  hypertension   . Colon polyps 05/10/2005    Hyperplastic polyps  . Depression   . OSA on CPAP     bipap  . Heart murmur     hx - no notable valvular lesion on Echo  . Cancer     prostate , melanoma head  . Arthritis   . Shingles 07  . Gout   . Diabetes mellitus type 2 with complications     neuropathy; CAD    MEDICATIONS: Current Outpatient Prescriptions on File Prior to Visit  Medication Sig Dispense Refill  . allopurinol (ZYLOPRIM) 300 MG tablet Take 300 mg by mouth daily.       Marland Kitchen amLODipine (NORVASC) 10 MG tablet Take 10 mg by mouth daily.       Marland Kitchen aspirin EC 81 MG tablet Take 81 mg by mouth daily.      . clopidogrel (PLAVIX) 75 MG tablet Take 1 tablet by mouth daily.      Marland Kitchen escitalopram (LEXAPRO) 10 MG tablet Take 10 mg by mouth daily.       Marland Kitchen oxybutynin (DITROPAN XL) 15 MG 24 hr tablet Take 15 mg by mouth at bedtime.      . rosuvastatin (CRESTOR) 20 MG tablet Take 20 mg by mouth daily.      . tamsulosin (FLOMAX) 0.4 MG CAPS Take 0.4 mg by mouth at bedtime.      . valsartan (DIOVAN) 80 MG tablet Take 80 mg by mouth daily.        No current facility-administered medications on file prior to visit.    ALLERGIES: Allergies  Allergen Reactions  . Betadine [Povidone Iodine] Anaphylaxis  . Contrast Media [Iodinated Diagnostic Agents] Anaphylaxis  . Shellfish Allergy Anaphylaxis    FAMILY HISTORY: Family History  Problem Relation Age of Onset  . Hypertension Mother   . Stroke Mother   . Hypertension Father   . Prostate cancer Father   . Hypertension Brother   . Prostate cancer Brother     SOCIAL HISTORY: History   Social History  . Marital Status: Married    Spouse Name: N/A    Number of Children: N/A  . Years of Education: N/A   Occupational History  . Not on file.   Social History Main Topics  . Smoking status: Never Smoker   . Smokeless tobacco: Never Used     Comment: occ alcohol  . Alcohol Use: No     Comment: OCCASIONAL  . Drug Use: No  .  Sexual Activity: Not on file   Other Topics Concern  . Not on file   Social History Narrative   He is married, father of 2, grandfather of 53. Does not really get exercise now because   of his back issues. Does not smoke and drinks social alcohol. Works in [employer].    REVIEW OF SYSTEMS: Constitutional: No fevers, chills, or sweats, no generalized fatigue, change in appetite Eyes: No visual changes, double vision, eye  pain Ear, nose and throat: No hearing loss, ear pain, nasal congestion, sore throat Cardiovascular: No chest pain, palpitations Respiratory:  No shortness of breath at rest or with exertion, wheezes GastrointestinaI: No nausea, vomiting, diarrhea, abdominal pain, fecal incontinence Genitourinary:  No dysuria, urinary retention or frequency Musculoskeletal:  No neck pain, back pain Integumentary: No rash, pruritus, skin lesions Neurological: as above Psychiatric: No depression, insomnia, anxiety Endocrine: No palpitations, fatigue, diaphoresis, mood swings, change in appetite, change in weight, increased thirst Hematologic/Lymphatic:  No anemia, purpura, petechiae. Allergic/Immunologic: no itchy/runny eyes, nasal congestion, recent allergic reactions, rashes  PHYSICAL EXAM: Filed Vitals:   11/01/13 1039  BP: 116/70  Pulse: 57  Resp: 16   General: No acute distress Head:  Normocephalic/atraumatic  IMPRESSION: 1.  Intramedullary ependymoma status post surgery. 2.  Idiopathic sensorimotor polyneuropathy of the predominantly axonal type.    Given lack of other upper motor neuron signs, the lower extremity numbness could be due to polyneuropathy.  Etiology of neuropathy is not clear.  This could be related to either glucose intolerance or low B12.  His diet has improved and his serum glucose has improved and he has been started on B12 shots, so we have to see if the numbness improves as well.  PLAN: 1.  Monitor symptoms for now. 2.  Follow up at [facility] next month  for repeat MRI 3.  Continue B12 shots 4.  Follow up in 2 months.  15 minutes spent with the patient and his wife, 100% spent discussing results of lab work and NCV-EMG and discussing diagnosis and potential etiologies.  Metta Clines, DO  CC:  Lavone Orn, MD  Erline Levine, MD

## 2013-11-04 ENCOUNTER — Ambulatory Visit (INDEPENDENT_AMBULATORY_CARE_PROVIDER_SITE_OTHER): Payer: Medicare Other | Admitting: *Deleted

## 2013-11-04 DIAGNOSIS — E538 Deficiency of other specified B group vitamins: Secondary | ICD-10-CM

## 2013-11-04 MED ORDER — CYANOCOBALAMIN 1000 MCG/ML IJ SOLN
1000.0000 ug | Freq: Once | INTRAMUSCULAR | Status: AC
  Administered 2013-11-04: 1000 ug via INTRAMUSCULAR

## 2013-11-05 ENCOUNTER — Ambulatory Visit (INDEPENDENT_AMBULATORY_CARE_PROVIDER_SITE_OTHER): Payer: Medicare Other | Admitting: *Deleted

## 2013-11-05 DIAGNOSIS — E538 Deficiency of other specified B group vitamins: Secondary | ICD-10-CM

## 2013-11-05 DIAGNOSIS — R413 Other amnesia: Secondary | ICD-10-CM | POA: Diagnosis not present

## 2013-11-05 MED ORDER — CYANOCOBALAMIN 1000 MCG/ML IJ SOLN
1000.0000 ug | Freq: Once | INTRAMUSCULAR | Status: AC
  Administered 2013-11-05: 1000 ug via INTRAMUSCULAR

## 2013-11-06 ENCOUNTER — Ambulatory Visit (INDEPENDENT_AMBULATORY_CARE_PROVIDER_SITE_OTHER): Payer: Medicare Other | Admitting: *Deleted

## 2013-11-06 DIAGNOSIS — E538 Deficiency of other specified B group vitamins: Secondary | ICD-10-CM

## 2013-11-06 DIAGNOSIS — R413 Other amnesia: Secondary | ICD-10-CM | POA: Diagnosis not present

## 2013-11-06 MED ORDER — CYANOCOBALAMIN 1000 MCG/ML IJ SOLN
1000.0000 ug | Freq: Once | INTRAMUSCULAR | Status: AC
  Administered 2013-11-06: 1000 ug via INTRAMUSCULAR

## 2013-11-07 ENCOUNTER — Ambulatory Visit: Payer: Medicare Other | Admitting: *Deleted

## 2013-11-07 DIAGNOSIS — E538 Deficiency of other specified B group vitamins: Secondary | ICD-10-CM

## 2013-11-07 DIAGNOSIS — R413 Other amnesia: Secondary | ICD-10-CM

## 2013-11-07 MED ORDER — CYANOCOBALAMIN 1000 MCG/ML IJ SOLN
1000.0000 ug | Freq: Once | INTRAMUSCULAR | Status: AC
  Administered 2013-11-07: 1000 ug via INTRAMUSCULAR

## 2013-11-08 ENCOUNTER — Ambulatory Visit: Payer: Medicare Other

## 2013-11-11 ENCOUNTER — Ambulatory Visit (INDEPENDENT_AMBULATORY_CARE_PROVIDER_SITE_OTHER): Payer: Medicare Other | Admitting: *Deleted

## 2013-11-11 DIAGNOSIS — E538 Deficiency of other specified B group vitamins: Secondary | ICD-10-CM | POA: Diagnosis not present

## 2013-11-11 DIAGNOSIS — R413 Other amnesia: Secondary | ICD-10-CM | POA: Diagnosis not present

## 2013-11-11 MED ORDER — CYANOCOBALAMIN 1000 MCG/ML IJ SOLN
1000.0000 ug | Freq: Once | INTRAMUSCULAR | Status: AC
Start: 2013-11-11 — End: 2013-11-11
  Administered 2013-11-11: 1000 ug via INTRAMUSCULAR

## 2013-11-13 DIAGNOSIS — D497 Neoplasm of unspecified behavior of endocrine glands and other parts of nervous system: Secondary | ICD-10-CM | POA: Diagnosis not present

## 2013-11-13 DIAGNOSIS — R9389 Abnormal findings on diagnostic imaging of other specified body structures: Secondary | ICD-10-CM | POA: Diagnosis not present

## 2013-11-13 DIAGNOSIS — M949 Disorder of cartilage, unspecified: Secondary | ICD-10-CM | POA: Diagnosis not present

## 2013-11-13 DIAGNOSIS — M5124 Other intervertebral disc displacement, thoracic region: Secondary | ICD-10-CM | POA: Diagnosis not present

## 2013-11-13 DIAGNOSIS — M899 Disorder of bone, unspecified: Secondary | ICD-10-CM | POA: Diagnosis not present

## 2013-11-14 DIAGNOSIS — D432 Neoplasm of uncertain behavior of brain, unspecified: Secondary | ICD-10-CM | POA: Diagnosis not present

## 2013-11-14 DIAGNOSIS — C720 Malignant neoplasm of spinal cord: Secondary | ICD-10-CM | POA: Diagnosis not present

## 2013-11-14 DIAGNOSIS — D434 Neoplasm of uncertain behavior of spinal cord: Secondary | ICD-10-CM | POA: Diagnosis not present

## 2013-11-18 ENCOUNTER — Ambulatory Visit (INDEPENDENT_AMBULATORY_CARE_PROVIDER_SITE_OTHER): Payer: Medicare Other | Admitting: *Deleted

## 2013-11-18 DIAGNOSIS — E538 Deficiency of other specified B group vitamins: Secondary | ICD-10-CM

## 2013-11-18 MED ORDER — CYANOCOBALAMIN 1000 MCG/ML IJ SOLN
1000.0000 ug | Freq: Once | INTRAMUSCULAR | Status: AC
  Administered 2013-11-18: 1000 ug via INTRAMUSCULAR

## 2013-11-18 NOTE — Progress Notes (Signed)
Patient in for B12 injection. 

## 2013-11-25 ENCOUNTER — Ambulatory Visit (INDEPENDENT_AMBULATORY_CARE_PROVIDER_SITE_OTHER): Payer: Medicare Other | Admitting: *Deleted

## 2013-11-25 DIAGNOSIS — E538 Deficiency of other specified B group vitamins: Secondary | ICD-10-CM

## 2013-11-25 MED ORDER — CYANOCOBALAMIN 1000 MCG/ML IJ SOLN: 1000.0000 ug | Freq: Once | INTRAMUSCULAR | Status: DC

## 2013-11-25 MED ORDER — CYANOCOBALAMIN 1000 MCG/ML IJ SOLN
1000.0000 ug | Freq: Once | INTRAMUSCULAR | Status: AC
  Administered 2013-11-25: 1000 ug via INTRAMUSCULAR

## 2013-11-28 DIAGNOSIS — L723 Sebaceous cyst: Secondary | ICD-10-CM | POA: Diagnosis not present

## 2013-12-02 ENCOUNTER — Ambulatory Visit: Payer: Medicare Other

## 2013-12-03 ENCOUNTER — Ambulatory Visit (INDEPENDENT_AMBULATORY_CARE_PROVIDER_SITE_OTHER): Payer: Medicare Other | Admitting: *Deleted

## 2013-12-03 DIAGNOSIS — E538 Deficiency of other specified B group vitamins: Secondary | ICD-10-CM | POA: Diagnosis not present

## 2013-12-03 MED ORDER — CYANOCOBALAMIN 1000 MCG/ML IJ SOLN
1000.0000 ug | Freq: Once | INTRAMUSCULAR | Status: AC
  Administered 2013-12-03: 1000 ug via INTRAMUSCULAR

## 2013-12-23 DIAGNOSIS — H2510 Age-related nuclear cataract, unspecified eye: Secondary | ICD-10-CM | POA: Diagnosis not present

## 2013-12-23 DIAGNOSIS — H25019 Cortical age-related cataract, unspecified eye: Secondary | ICD-10-CM | POA: Diagnosis not present

## 2014-01-01 ENCOUNTER — Ambulatory Visit: Payer: Medicare Other | Admitting: Neurology

## 2014-01-01 ENCOUNTER — Telehealth: Payer: Self-pay | Admitting: Neurology

## 2014-01-01 DIAGNOSIS — M4714 Other spondylosis with myelopathy, thoracic region: Secondary | ICD-10-CM | POA: Diagnosis not present

## 2014-01-01 DIAGNOSIS — Z6831 Body mass index (BMI) 31.0-31.9, adult: Secondary | ICD-10-CM | POA: Diagnosis not present

## 2014-01-01 DIAGNOSIS — E669 Obesity, unspecified: Secondary | ICD-10-CM | POA: Insufficient documentation

## 2014-01-01 DIAGNOSIS — D497 Neoplasm of unspecified behavior of endocrine glands and other parts of nervous system: Secondary | ICD-10-CM | POA: Diagnosis not present

## 2014-01-01 NOTE — Telephone Encounter (Signed)
Pt called this morning to advise he could not make his 930am appt b/c he is tied up at another office. Appt marked as a no show b/c we were given less than 24hrs prior approval but we will not send a no show letter. Pt says he will call back to r/s / Sherri S.

## 2014-01-15 DIAGNOSIS — H2511 Age-related nuclear cataract, right eye: Secondary | ICD-10-CM | POA: Diagnosis not present

## 2014-01-15 DIAGNOSIS — H25011 Cortical age-related cataract, right eye: Secondary | ICD-10-CM | POA: Diagnosis not present

## 2014-01-16 ENCOUNTER — Telehealth: Payer: Self-pay | Admitting: Neurology

## 2014-01-16 DIAGNOSIS — J40 Bronchitis, not specified as acute or chronic: Secondary | ICD-10-CM | POA: Diagnosis not present

## 2014-01-16 NOTE — Telephone Encounter (Signed)
Left message for patient to return call.

## 2014-01-16 NOTE — Telephone Encounter (Signed)
Pt needs to talk to someone about b-12 please call [phone]

## 2014-01-22 DIAGNOSIS — H25012 Cortical age-related cataract, left eye: Secondary | ICD-10-CM | POA: Diagnosis not present

## 2014-01-22 DIAGNOSIS — H2512 Age-related nuclear cataract, left eye: Secondary | ICD-10-CM | POA: Diagnosis not present

## 2014-01-23 DIAGNOSIS — Z23 Encounter for immunization: Secondary | ICD-10-CM | POA: Diagnosis not present

## 2014-01-28 ENCOUNTER — Other Ambulatory Visit: Payer: Self-pay | Admitting: Dermatology

## 2014-01-28 DIAGNOSIS — L720 Epidermal cyst: Secondary | ICD-10-CM | POA: Diagnosis not present

## 2014-01-28 DIAGNOSIS — D485 Neoplasm of uncertain behavior of skin: Secondary | ICD-10-CM | POA: Diagnosis not present

## 2014-02-03 ENCOUNTER — Ambulatory Visit: Payer: Medicare Other

## 2014-02-17 DIAGNOSIS — D439 Neoplasm of uncertain behavior of central nervous system, unspecified: Secondary | ICD-10-CM | POA: Insufficient documentation

## 2014-02-19 ENCOUNTER — Other Ambulatory Visit: Payer: Self-pay | Admitting: Neurosurgery

## 2014-02-19 DIAGNOSIS — D492 Neoplasm of unspecified behavior of bone, soft tissue, and skin: Secondary | ICD-10-CM

## 2014-03-06 DIAGNOSIS — M4714 Other spondylosis with myelopathy, thoracic region: Secondary | ICD-10-CM | POA: Diagnosis not present

## 2014-03-07 ENCOUNTER — Ambulatory Visit
Admission: RE | Admit: 2014-03-07 | Discharge: 2014-03-07 | Disposition: A | Discharge status: Home / Self Care | Payer: Medicare Other | Source: Ambulatory Visit | Attending: Neurosurgery | Admitting: Neurosurgery

## 2014-03-07 DIAGNOSIS — D492 Neoplasm of unspecified behavior of bone, soft tissue, and skin: Secondary | ICD-10-CM

## 2014-03-07 DIAGNOSIS — C412 Malignant neoplasm of vertebral column: Secondary | ICD-10-CM | POA: Diagnosis not present

## 2014-03-07 MED ORDER — GADOBENATE DIMEGLUMINE 529 MG/ML IV SOLN
20.0000 mL | Freq: Once | INTRAVENOUS | Status: AC | PRN
  Administered 2014-03-07: 20 mL via INTRAVENOUS

## 2014-03-14 DIAGNOSIS — M4714 Other spondylosis with myelopathy, thoracic region: Secondary | ICD-10-CM | POA: Diagnosis not present

## 2014-03-21 DIAGNOSIS — M4714 Other spondylosis with myelopathy, thoracic region: Secondary | ICD-10-CM | POA: Diagnosis not present

## 2014-04-14 DIAGNOSIS — M4714 Other spondylosis with myelopathy, thoracic region: Secondary | ICD-10-CM | POA: Diagnosis not present

## 2014-04-14 DIAGNOSIS — I10 Essential (primary) hypertension: Secondary | ICD-10-CM | POA: Diagnosis not present

## 2014-04-14 DIAGNOSIS — D497 Neoplasm of unspecified behavior of endocrine glands and other parts of nervous system: Secondary | ICD-10-CM | POA: Diagnosis not present

## 2014-04-14 DIAGNOSIS — Z6833 Body mass index (BMI) 33.0-33.9, adult: Secondary | ICD-10-CM | POA: Diagnosis not present

## 2014-04-18 DIAGNOSIS — Z23 Encounter for immunization: Secondary | ICD-10-CM | POA: Diagnosis not present

## 2014-04-18 DIAGNOSIS — F324 Major depressive disorder, single episode, in partial remission: Secondary | ICD-10-CM | POA: Diagnosis not present

## 2014-04-18 DIAGNOSIS — R200 Anesthesia of skin: Secondary | ICD-10-CM | POA: Diagnosis not present

## 2014-04-18 DIAGNOSIS — Z0000 Encounter for general adult medical examination without abnormal findings: Secondary | ICD-10-CM | POA: Diagnosis not present

## 2014-04-18 DIAGNOSIS — G4733 Obstructive sleep apnea (adult) (pediatric): Secondary | ICD-10-CM | POA: Diagnosis not present

## 2014-04-18 DIAGNOSIS — C720 Malignant neoplasm of spinal cord: Secondary | ICD-10-CM | POA: Diagnosis not present

## 2014-04-18 DIAGNOSIS — C61 Malignant neoplasm of prostate: Secondary | ICD-10-CM | POA: Diagnosis not present

## 2014-04-18 DIAGNOSIS — I10 Essential (primary) hypertension: Secondary | ICD-10-CM | POA: Diagnosis not present

## 2014-04-18 DIAGNOSIS — N3281 Overactive bladder: Secondary | ICD-10-CM | POA: Diagnosis not present

## 2014-04-18 DIAGNOSIS — M109 Gout, unspecified: Secondary | ICD-10-CM | POA: Diagnosis not present

## 2014-04-18 DIAGNOSIS — E782 Mixed hyperlipidemia: Secondary | ICD-10-CM | POA: Diagnosis not present

## 2014-04-18 DIAGNOSIS — E119 Type 2 diabetes mellitus without complications: Secondary | ICD-10-CM | POA: Diagnosis not present

## 2014-05-05 ENCOUNTER — Other Ambulatory Visit (HOSPITAL_COMMUNITY): Payer: Self-pay | Admitting: Internal Medicine

## 2014-05-05 ENCOUNTER — Other Ambulatory Visit: Payer: Self-pay | Admitting: Internal Medicine

## 2014-05-05 ENCOUNTER — Ambulatory Visit
Admission: RE | Admit: 2014-05-05 | Discharge: 2014-05-05 | Disposition: A | Discharge status: Home / Self Care | Payer: Medicare Other | Source: Ambulatory Visit | Attending: Internal Medicine | Admitting: Internal Medicine

## 2014-05-05 DIAGNOSIS — M79605 Pain in left leg: Secondary | ICD-10-CM | POA: Diagnosis not present

## 2014-05-05 DIAGNOSIS — M25562 Pain in left knee: Secondary | ICD-10-CM

## 2014-05-05 DIAGNOSIS — M7989 Other specified soft tissue disorders: Secondary | ICD-10-CM | POA: Diagnosis not present

## 2014-05-05 DIAGNOSIS — S8992XA Unspecified injury of left lower leg, initial encounter: Secondary | ICD-10-CM | POA: Diagnosis not present

## 2014-05-06 ENCOUNTER — Ambulatory Visit (HOSPITAL_COMMUNITY)
Admission: RE | Admit: 2014-05-06 | Discharge: 2014-05-06 | Disposition: A | Discharge status: Home / Self Care | Payer: Medicare Other | Source: Ambulatory Visit | Attending: Internal Medicine | Admitting: Internal Medicine

## 2014-05-06 DIAGNOSIS — M79605 Pain in left leg: Secondary | ICD-10-CM | POA: Diagnosis not present

## 2014-05-06 DIAGNOSIS — M7989 Other specified soft tissue disorders: Secondary | ICD-10-CM | POA: Diagnosis not present

## 2014-05-06 DIAGNOSIS — M79609 Pain in unspecified limb: Secondary | ICD-10-CM | POA: Diagnosis not present

## 2014-05-06 NOTE — Progress Notes (Signed)
VASCULAR LAB PRELIMINARY  PRELIMINARY  PRELIMINARY  PRELIMINARY  Left lower extremity venous duplex completed.    Preliminary report:  Left:  No evidence of DVT, superficial thrombosis, or Baker's cyst.  [name], RVS 05/06/2014, 4:12 PM

## 2014-05-07 DIAGNOSIS — M76899 Other specified enthesopathies of unspecified lower limb, excluding foot: Secondary | ICD-10-CM | POA: Diagnosis not present

## 2014-05-07 DIAGNOSIS — M25569 Pain in unspecified knee: Secondary | ICD-10-CM | POA: Diagnosis not present

## 2014-05-26 DIAGNOSIS — Z961 Presence of intraocular lens: Secondary | ICD-10-CM | POA: Diagnosis not present

## 2014-06-10 ENCOUNTER — Encounter: Payer: Self-pay | Admitting: *Deleted

## 2014-08-05 ENCOUNTER — Other Ambulatory Visit: Payer: Self-pay | Admitting: Neurosurgery

## 2014-08-05 DIAGNOSIS — D449 Neoplasm of uncertain behavior of unspecified endocrine gland: Principal | ICD-10-CM

## 2014-08-05 DIAGNOSIS — D439 Neoplasm of uncertain behavior of central nervous system, unspecified: Secondary | ICD-10-CM

## 2014-08-08 ENCOUNTER — Ambulatory Visit
Admission: RE | Admit: 2014-08-08 | Discharge: 2014-08-08 | Disposition: A | Discharge status: Home / Self Care | Payer: Medicare Other | Source: Ambulatory Visit | Attending: Neurosurgery | Admitting: Neurosurgery

## 2014-08-08 DIAGNOSIS — D449 Neoplasm of uncertain behavior of unspecified endocrine gland: Principal | ICD-10-CM

## 2014-08-08 DIAGNOSIS — M549 Dorsalgia, unspecified: Secondary | ICD-10-CM | POA: Diagnosis not present

## 2014-08-08 DIAGNOSIS — D439 Neoplasm of uncertain behavior of central nervous system, unspecified: Secondary | ICD-10-CM

## 2014-08-08 DIAGNOSIS — C412 Malignant neoplasm of vertebral column: Secondary | ICD-10-CM | POA: Diagnosis not present

## 2014-08-08 MED ORDER — GADOBENATE DIMEGLUMINE 529 MG/ML IV SOLN
19.0000 mL | Freq: Once | INTRAVENOUS | Status: AC | PRN
  Administered 2014-08-08: 19 mL via INTRAVENOUS

## 2014-09-03 DIAGNOSIS — Z6832 Body mass index (BMI) 32.0-32.9, adult: Secondary | ICD-10-CM | POA: Diagnosis not present

## 2014-09-03 DIAGNOSIS — M4714 Other spondylosis with myelopathy, thoracic region: Secondary | ICD-10-CM | POA: Diagnosis not present

## 2014-09-03 DIAGNOSIS — D497 Neoplasm of unspecified behavior of endocrine glands and other parts of nervous system: Secondary | ICD-10-CM | POA: Diagnosis not present

## 2014-09-04 ENCOUNTER — Other Ambulatory Visit: Payer: Self-pay | Admitting: Neurosurgery

## 2014-09-16 ENCOUNTER — Ambulatory Visit (INDEPENDENT_AMBULATORY_CARE_PROVIDER_SITE_OTHER): Payer: Medicare Other | Admitting: Cardiology

## 2014-09-16 ENCOUNTER — Encounter: Payer: Self-pay | Admitting: Cardiology

## 2014-09-16 VITALS — BP 136/80 | HR 56 | Ht 69.0 in | Wt 221.1 lb

## 2014-09-16 DIAGNOSIS — R079 Chest pain, unspecified: Secondary | ICD-10-CM | POA: Diagnosis not present

## 2014-09-16 DIAGNOSIS — R5383 Other fatigue: Secondary | ICD-10-CM

## 2014-09-16 DIAGNOSIS — E785 Hyperlipidemia, unspecified: Secondary | ICD-10-CM

## 2014-09-16 DIAGNOSIS — I10 Essential (primary) hypertension: Secondary | ICD-10-CM | POA: Diagnosis not present

## 2014-09-16 DIAGNOSIS — I2510 Atherosclerotic heart disease of native coronary artery without angina pectoris: Secondary | ICD-10-CM

## 2014-09-16 DIAGNOSIS — Z01810 Encounter for preprocedural cardiovascular examination: Secondary | ICD-10-CM | POA: Diagnosis not present

## 2014-09-16 DIAGNOSIS — Z9861 Coronary angioplasty status: Secondary | ICD-10-CM

## 2014-09-16 NOTE — Progress Notes (Signed)
PCP: Irven Shelling, MD  Clinic Note: Chief Complaint  Patient presents with  . Annual Exam    1 year followup; has chest pain-has fluttering and pain in chest,no shortness of breath, no edema, no pain in legs, occassional cramping in legs, no lightheadedness, no dizziness  . Coronary Artery Disease  . Pre-op Exam   HPI: [name] is a 79 y.o. male with a Cardiovascular Problem List below who presents today for 17-month followup of his coronary disease.   Since his last visit - MRI evidence suggest that his spinal tumor has increased in size, his NeuroSurgeon here was recommending re-attempt at surgical excision, so [facility].  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   Upcoming surgery in Labor Day - still LE numbness.  Bowel/bladder control OK. Planning cruise next week.  Interval History:   While being evaluated by surgeon, he commented on chest discomfort & heart fluttering. Chest discomfort - couple months ago, strange sensation / annoying.  Occurred @ rest. NOt with exertion.  Then had Sub-sternal discomfort - occurs "whenever".  Also notes occasional "fluttering" like a hummingbird sensation - on & off for couple of months.  Episodes lasting ~minutes.  No lightheadedness / dizziness associated with it. 1 episode of dizziness - poor equilibrium. No exercise - gets tired easier than he used to.    Cardiovascular ROS: positive for - edema, palpitations and exercise intolerance negative for - chest pain, loss of consciousness, murmur, orthopnea, paroxysmal nocturnal dyspnea, shortness of breath or syncope/near syncope; TIA/amaurosis fugax.  Past Medical History  Diagnosis Date  . History of prostate cancer 2004--  S/P SEED IMPLANTS     NO RECURRENCE  . S/P primary  angioplasty with coronary stent 2006--      X2 DE STENTS - Cypher 2.5 mm postdilated to 2.75 mm (20 mm and 13 mm stents)  . Urgency of urination   . Frequency of urination   . H/O: gout STABLE  . Impaired hearing RIGHT HEARING AID  . Hydrocele, left   . History of melanoma excision 2010    FOREHEAD  . CAD S/P percutaneous coronary angioplasty CARDIOLOGIST-  DR Martin [facility]    Myoview March 2014: No ischemia or infarction  . Diastolic dysfunction without heart failure     Moderate LVH, Gr 1 DD on Echo 2011; no CHF admissions, no tt on diuretisc  . Hyperlipidemia LDL goal <70   . Essential hypertension   . Colon polyps 05/10/2005    Hyperplastic polyps  . Depression   . OSA on CPAP     bipap  . Heart murmur     hx - no notable valvular lesion on Echo  . Cancer     prostate , melanoma head  . Arthritis   . Shingles 07  . Gout   . Diabetes mellitus type 2 with complications     neuropathy; CAD   Prior Cardiac Evaluation and Past Surgical History: Procedure Laterality Date  . Coronary angioplasty with stent placement  09-01-2004      Cypher DES x 2 d-m LCx 2.5 mm x 20 mm & 2.5 mm x 13 mm (~2.75 mm)  . Transthoracic echocardiogram  MAR [id number]    NORMAL EF > 55% / NORMAL FUNCTION WITH IMPAIRED RELAXATION AND  MODERATE CONCENTRIC LVH LIKELY HYPERTENSIVE DISEASE  . Nm myoview ltd  [id number]    Lexiscan: LOW RISK, mild inferior bowel artifact; No ischemia or Infarction   Allergies  Allergen Reactions  . Betadine [Povidone Iodine] Anaphylaxis  . Contrast Media [Iodinated Diagnostic Agents] Anaphylaxis and Rash    Other Reaction: Other reaction  . Iodine Anaphylaxis    Shellfish, IVP dye. Swelling in throat and foaming at mouth.  Marland Kitchen Shellfish Allergy Anaphylaxis   Current Outpatient Prescriptions on File Prior to Visit  Medication Sig Dispense Refill  . allopurinol (ZYLOPRIM) 300 MG tablet Take 300 mg by mouth daily.     Marland Kitchen amLODipine (NORVASC) 10 MG tablet Take 10 mg by mouth  daily.     Marland Kitchen aspirin EC 81 MG tablet Take 81 mg by mouth daily.    . clopidogrel (PLAVIX) 75 MG tablet Take 1 tablet by mouth daily.    Marland Kitchen oxybutynin (DITROPAN XL) 15 MG 24 hr tablet Take 15 mg by mouth at bedtime.    . rosuvastatin (CRESTOR) 20 MG tablet Take 20 mg by mouth daily.    . tamsulosin (FLOMAX) 0.4 MG CAPS Take 0.4 mg by mouth at bedtime.    . valsartan (DIOVAN) 80 MG tablet Take 80 mg by mouth daily.     Marland Kitchen escitalopram (LEXAPRO) 10 MG tablet Take 10 mg by mouth daily.      No current facility-administered medications on file prior to visit.    History   Social History Narrative   He is married, father of 2, grandfather of 43. Does not really get exercise now because   of his back issues. Does not smoke and drinks social alcohol. Works in [employer].   Family History  Problem Relation Age of Onset  . Hypertension Mother   . Stroke Mother   . Hypertension Father   . Prostate cancer Father   . Hypertension Brother   . Prostate cancer Brother    ROS: A comprehensive Review of Systems - Negative except Malaise and fatigue. Lack of motivation. No arthralgias or myalgias. He still has this in the feet and lack of sensation in the pelvic area including inability -- with  Neuropathy in his legs and buttock secondary to his back tumor but notably improved.; He remains quite anxious about his prognosis. Review of Systems  Constitutional: Positive for malaise/fatigue.  Cardiovascular: Positive for leg swelling (occasional).  Gastrointestinal: Negative for blood in stool and melena.  Genitourinary: Negative for hematuria.  Musculoskeletal: Positive for back pain.  Neurological: Positive for dizziness.       Leg numbness.  All other systems reviewed and are negative.  PHYSICAL EXAM BP 136/80 mmHg  Pulse 56  Ht 5\' 9"  (1.753 m)  Wt 100.29 kg (221 lb 1.6 oz)  BMI 32.64 kg/m2 General appearance: alert, cooperative, appears stated age, no distress and mildly obese Neck: no  adenopathy, no carotid bruit and no JVD Lungs: clear to auscultation bilaterally, normal percussion bilaterally and non-labored Heart: regular rate and rhythm, S1, S2 normal, no murmur, click, rub or gallop; nondisplaced PMI Abdomen: soft, non-tender; bowel sounds normal; no masses,  no organomegaly;  Extremities: extremities normal, atraumatic, no cyanosis, or edema  Pulses: 2+ and symmetric;  Neurologic: Mental status: Alert, oriented, thought content appropriate  [id number] today: Yes Rate: 56 , Rhythm:  Sinus bradycardia with PACs (bigeminy)  Recent Labs last cholesterol check in July showed LDL 78 and triglyceride 157    ASSESSMENT / PLAN:  Fatigue - cP &  palpitations - > Lexi Myoview Problem List Items Addressed This Visit    CAD S/P percutaneous coronary angioplasty: Cypher DES x2 to circumflex - Primary (Chronic)    Really hadn't had active symptoms until this more recent episode of discomfort. He has had negative stress tests in the past. Continues on aspirin Plavix as well as statin ARB did not beta blocker due to bradycardia.  To confirm no active CV, we'll check Lexiscan Myoview preoperatively      Relevant Orders   EKG 12-Lead (Completed)   Myocardial Perfusion Imaging   Chest pain with moderate risk for cardiac etiology    S. He is not active, will evaluate Lexiscan Myoview: This is for risk stratification. It would only react to high risk stress test.      Relevant Orders   EKG 12-Lead (Completed)   Myocardial Perfusion Imaging   Essential hypertension (Chronic)   Relevant Orders   EKG 12-Lead (Completed)   Myocardial Perfusion Imaging   Hyperlipidemia LDL goal <70 (Chronic)   Relevant Orders   EKG 12-Lead (Completed)   Myocardial Perfusion Imaging   Other fatigue    I think is probably more related to deconditioning, however it may also be somewhat exercise intolerance and therefore we should evaluate for possible ischemia.      Relevant Orders   EKG  12-Lead (Completed)   Myocardial Perfusion Imaging   Pre-operative cardiovascular examination    He again has an upcoming back surgery scheduled. His concern is that he has noted some discomfort in his chest. It's been a long time since he had his PCI and he is not sure what his symptoms really weren't that time. I would suspect that this may not be cardiac in nature, however there is at least moderate risk based on the fact that he is extremely limited as far as his motility and we can't really fully assess it. : Relatively low risk cardiac standpoint surgery, A. Since he is having symptoms will evaluate with a Lexiscan Myoview for risk stratification. Would only react to a "high risk" stress test. Otherwise would prefer to go ahead and proceed with his needed back surgery and address any cardiac issues afterwards.      Relevant Orders   EKG 12-Lead (Completed)   Myocardial Perfusion Imaging       Orders Placed This Encounter  Procedures  . Myocardial Perfusion Imaging    Standing Status: Future     Number of Occurrences:      Standing Expiration Date: 09/16/2015    Order Specific Question:  Where should this test be performed    Answer:  [facility]    Order Specific Question:  Type of stress    Answer:  Lexiscan    Order Specific Question:  Patient weight in lbs    Answer:  221  . EKG 12-Lead   Meds ordered this encounter  Medications  . VITAMIN D, CHOLECALCIFEROL, PO    Sig: Take 1 tablet by mouth.    Followup: 6 months unless ST abnormal   Arya Boxley, Leonie Green, M.D., M.S. Interventional Cardiologist   Pager # [phone] 09/17/2014

## 2014-09-16 NOTE — Patient Instructions (Signed)
Your physician has requested that you have a lexiscan myoview. For further information please visit [email or web address]. Please follow instruction sheet, as given.   IF TEST IS NORMAL WILL SEE YOU IN 6 MONTH,IF ABNORMAL WILL SEE YOU SOONER.    Your physician wants you to follow-up in [place].  You will receive a reminder letter in the mail two months in advance. If you don't receive a letter, please call our office to schedule the follow-up appointment.

## 2014-09-17 ENCOUNTER — Encounter: Payer: Self-pay | Admitting: Cardiology

## 2014-09-17 DIAGNOSIS — Z01810 Encounter for preprocedural cardiovascular examination: Secondary | ICD-10-CM | POA: Insufficient documentation

## 2014-09-17 DIAGNOSIS — R079 Chest pain, unspecified: Secondary | ICD-10-CM | POA: Insufficient documentation

## 2014-09-17 DIAGNOSIS — R5382 Chronic fatigue, unspecified: Secondary | ICD-10-CM | POA: Insufficient documentation

## 2014-09-17 NOTE — Assessment & Plan Note (Signed)
I think is probably more related to deconditioning, however it may also be somewhat exercise intolerance and therefore we should evaluate for possible ischemia.

## 2014-09-17 NOTE — Assessment & Plan Note (Signed)
S. He is not active, will evaluate Lexiscan Myoview: This is for risk stratification. It would only react to high risk stress test.

## 2014-09-17 NOTE — Assessment & Plan Note (Signed)
He again has an upcoming back surgery scheduled. His concern is that he has noted some discomfort in his chest. It's been a long time since he had his PCI and he is not sure what his symptoms really weren't that time. I would suspect that this may not be cardiac in nature, however there is at least moderate risk based on the fact that he is extremely limited as far as his motility and we can't really fully assess it. : Relatively low risk cardiac standpoint surgery, A. Since he is having symptoms will evaluate with a Lexiscan Myoview for risk stratification. Would only react to a "high risk" stress test. Otherwise would prefer to go ahead and proceed with his needed back surgery and address any cardiac issues afterwards.

## 2014-09-17 NOTE — Assessment & Plan Note (Signed)
Really hadn't had active symptoms until this more recent episode of discomfort. He has had negative stress tests in the past. Continues on aspirin Plavix as well as statin ARB did not beta blocker due to bradycardia.  To confirm no active CV, we'll check Lexiscan Myoview preoperatively

## 2014-09-24 ENCOUNTER — Telehealth (HOSPITAL_COMMUNITY): Payer: Self-pay

## 2014-09-24 ENCOUNTER — Telehealth: Payer: Self-pay | Admitting: *Deleted

## 2014-09-24 NOTE — Telephone Encounter (Signed)
Returned CPAP supply order to [facility].

## 2014-09-24 NOTE — Telephone Encounter (Signed)
Encounter complete. 

## 2014-09-26 ENCOUNTER — Ambulatory Visit (HOSPITAL_COMMUNITY)
Admission: RE | Admit: 2014-09-26 | Discharge: 2014-09-26 | Disposition: A | Discharge status: Home / Self Care | Payer: Medicare Other | Source: Ambulatory Visit | Attending: Cardiovascular Disease | Admitting: Cardiovascular Disease

## 2014-09-26 DIAGNOSIS — E119 Type 2 diabetes mellitus without complications: Secondary | ICD-10-CM | POA: Diagnosis not present

## 2014-09-26 DIAGNOSIS — Z01810 Encounter for preprocedural cardiovascular examination: Secondary | ICD-10-CM | POA: Diagnosis not present

## 2014-09-26 DIAGNOSIS — E669 Obesity, unspecified: Secondary | ICD-10-CM | POA: Insufficient documentation

## 2014-09-26 DIAGNOSIS — R079 Chest pain, unspecified: Secondary | ICD-10-CM | POA: Diagnosis not present

## 2014-09-26 DIAGNOSIS — I2510 Atherosclerotic heart disease of native coronary artery without angina pectoris: Secondary | ICD-10-CM | POA: Diagnosis not present

## 2014-09-26 DIAGNOSIS — Z955 Presence of coronary angioplasty implant and graft: Secondary | ICD-10-CM | POA: Insufficient documentation

## 2014-09-26 DIAGNOSIS — Z8249 Family history of ischemic heart disease and other diseases of the circulatory system: Secondary | ICD-10-CM | POA: Diagnosis not present

## 2014-09-26 DIAGNOSIS — Z6832 Body mass index (BMI) 32.0-32.9, adult: Secondary | ICD-10-CM | POA: Insufficient documentation

## 2014-09-26 DIAGNOSIS — R42 Dizziness and giddiness: Secondary | ICD-10-CM | POA: Diagnosis present

## 2014-09-26 DIAGNOSIS — R5383 Other fatigue: Secondary | ICD-10-CM

## 2014-09-26 DIAGNOSIS — E785 Hyperlipidemia, unspecified: Secondary | ICD-10-CM | POA: Insufficient documentation

## 2014-09-26 DIAGNOSIS — Z9861 Coronary angioplasty status: Secondary | ICD-10-CM | POA: Diagnosis not present

## 2014-09-26 DIAGNOSIS — I10 Essential (primary) hypertension: Secondary | ICD-10-CM | POA: Diagnosis not present

## 2014-09-26 DIAGNOSIS — I252 Old myocardial infarction: Secondary | ICD-10-CM | POA: Diagnosis not present

## 2014-09-26 LAB — MYOCARDIAL PERFUSION IMAGING
Peak HR: 70 {beats}/min
Rest HR: 59 {beats}/min
SDS: 3
SRS: 0
SSS: 3
TID: 1.1

## 2014-09-26 MED ORDER — TECHNETIUM TC 99M SESTAMIBI GENERIC - CARDIOLITE
10.8000 | Freq: Once | INTRAVENOUS | Status: AC | PRN
  Administered 2014-09-26: 11 via INTRAVENOUS

## 2014-09-26 MED ORDER — TECHNETIUM TC 99M SESTAMIBI GENERIC - CARDIOLITE
31.1000 | Freq: Once | INTRAVENOUS | Status: AC | PRN
  Administered 2014-09-26: 31.1 via INTRAVENOUS

## 2014-09-26 MED ORDER — REGADENOSON 0.4 MG/5ML IV SOLN
0.4000 mg | Freq: Once | INTRAVENOUS | Status: AC
  Administered 2014-09-26: 0.4 mg via INTRAVENOUS

## 2014-09-27 NOTE — Progress Notes (Signed)
Quick Note:  Stress Test looked good!! No sign of significant Heart Artery Disease. Pump function is normal.  Good news!!.  Only notable finding was premature atrial beats -- I wonder if this is the cause of his discomfort. Should be OK for upcoming Back Surgery.  Leonie Man, MD  ______

## 2014-09-29 ENCOUNTER — Telehealth: Payer: Self-pay | Admitting: *Deleted

## 2014-09-29 NOTE — Telephone Encounter (Signed)
Spoke to patient. Result given . Verbalized understanding  

## 2014-09-29 NOTE — Telephone Encounter (Signed)
-----   Message from Leonie Man, MD sent at 09/27/2014  9:11 PM EDT ----- Stress Test looked good!! No sign of significant Heart Artery Disease.  Pump function is normal.  Good news!!.  Only notable finding was premature atrial beats -- I wonder if this is the cause of his discomfort. Should be OK for upcoming Back Surgery.  Leonie Man, MD

## 2014-10-23 DIAGNOSIS — E119 Type 2 diabetes mellitus without complications: Secondary | ICD-10-CM | POA: Diagnosis not present

## 2014-10-23 DIAGNOSIS — I10 Essential (primary) hypertension: Secondary | ICD-10-CM | POA: Diagnosis not present

## 2014-10-23 DIAGNOSIS — C720 Malignant neoplasm of spinal cord: Secondary | ICD-10-CM | POA: Diagnosis not present

## 2014-11-24 DIAGNOSIS — L308 Other specified dermatitis: Secondary | ICD-10-CM | POA: Diagnosis not present

## 2014-11-28 NOTE — Pre-Procedure Instructions (Addendum)
[name]  11/28/2014      [id number] - [place], [place]. AT [address]. [place] [address] Phone: [phone] Fax: [phone]    Your procedure is scheduled on Tuesday, December 09, 2014  Report to [facility] Admitting at 5:30 A.M.  Call this number if you have problems the morning of surgery:  [phone]   Remember:  Do not eat food or drink liquids after midnight Monday, December 08, 2014  Take these medicines the morning of surgery with A SIP OF WATER :allopurinol (ZYLOPRIM), amLODipine (NORVASC),  escitalopram (LEXAPRO)     STOP all herbel meds, nsaids (aleve,naproxen,advil,ibuprofen) 5 days prior to surgery starting 12/04/14 including vitamins   STOP plavix, aspirin per dr 12/03/14   Do not wear jewelry, make-up or nail polish.  Do not wear lotions, powders, or perfumes.  You may not wear deodorant.  Do not shave 48 hours prior to surgery.  Men may shave face and neck.  Do not bring valuables to the hospital.  [facility] is not responsible for any belongings or valuables.  Contacts, dentures or bridgework may not be worn into surgery.  Leave your suitcase in the car.  After surgery it may be brought to your room.  For patients admitted to the hospital, discharge time will be determined by your treatment team.  Patients discharged the day of surgery will not be allowed to drive home.   Name and phone number of your driver:   Special instructions: [facility] - Preparing for Surgery  Before surgery, you can play an important role.  Because skin is not sterile, your skin needs to be as free of germs as possible.  You can reduce the number of germs on you skin by washing with CHG (chlorahexidine gluconate) soap before surgery.  CHG is an antiseptic cleaner which kills germs and bonds with the skin to continue killing germs even after washing.  Please DO NOT use if you have an  allergy to CHG or antibacterial soaps.  If your skin becomes reddened/irritated stop using the CHG and inform your nurse when you arrive at Short Stay.  Do not shave (including legs and underarms) for at least 48 hours prior to the first CHG shower.  You may shave your face.  Please follow these instructions carefully:   1.  Shower with CHG Soap the night before surgery and the morning of Surgery.  2.  If you choose to wash your hair, wash your hair first as usual with your normal shampoo.  3.  After you shampoo, rinse your hair and body thoroughly to remove the Shampoo.  4.  Use CHG as you would any other liquid soap.  You can apply chg directly  to the skin and wash gently with scrungie or a clean washcloth.  5.  Apply the CHG Soap to your body ONLY FROM THE NECK DOWN.  Do not use on open wounds or open sores.  Avoid contact with your eyes, ears, mouth and genitals (private parts).  Wash genitals (private parts) with your normal soap.  6.  Wash thoroughly, paying special attention to the area where your surgery will be performed.  7.  Thoroughly rinse your body with warm water from the neck down.  8.  DO NOT shower/wash with your normal soap after using and rinsing off the CHG Soap.  9.  Pat yourself dry with a clean towel.  10.  Wear clean pajamas.            11.  Place clean sheets on your bed the night of your first shower and do not sleep with pets.  Day of Surgery  Do not apply any lotions/deodorants the morning of surgery.  Please wear clean clothes to the hospital/surgery center.  Please read over the following fact sheets that you were given. Pain Booklet, Coughing and Deep Breathing, MRSA Information and Surgical Site Infection Prevention

## 2014-12-01 ENCOUNTER — Encounter (HOSPITAL_COMMUNITY): Payer: Self-pay

## 2014-12-01 ENCOUNTER — Encounter (HOSPITAL_COMMUNITY)
Admission: RE | Admit: 2014-12-01 | Discharge: 2014-12-01 | Disposition: A | Discharge status: Home / Self Care | Payer: Medicare Other | Source: Ambulatory Visit | Attending: Neurosurgery | Admitting: Neurosurgery

## 2014-12-01 DIAGNOSIS — Z01812 Encounter for preprocedural laboratory examination: Secondary | ICD-10-CM | POA: Diagnosis not present

## 2014-12-01 DIAGNOSIS — I10 Essential (primary) hypertension: Secondary | ICD-10-CM | POA: Insufficient documentation

## 2014-12-01 DIAGNOSIS — Z7902 Long term (current) use of antithrombotics/antiplatelets: Secondary | ICD-10-CM | POA: Diagnosis not present

## 2014-12-01 DIAGNOSIS — Z01818 Encounter for other preprocedural examination: Secondary | ICD-10-CM | POA: Insufficient documentation

## 2014-12-01 DIAGNOSIS — Z8546 Personal history of malignant neoplasm of prostate: Secondary | ICD-10-CM | POA: Diagnosis not present

## 2014-12-01 DIAGNOSIS — E119 Type 2 diabetes mellitus without complications: Secondary | ICD-10-CM | POA: Diagnosis not present

## 2014-12-01 DIAGNOSIS — Z79899 Other long term (current) drug therapy: Secondary | ICD-10-CM | POA: Diagnosis not present

## 2014-12-01 DIAGNOSIS — I2510 Atherosclerotic heart disease of native coronary artery without angina pectoris: Secondary | ICD-10-CM | POA: Insufficient documentation

## 2014-12-01 DIAGNOSIS — Z7982 Long term (current) use of aspirin: Secondary | ICD-10-CM | POA: Insufficient documentation

## 2014-12-01 DIAGNOSIS — Z85820 Personal history of malignant melanoma of skin: Secondary | ICD-10-CM | POA: Diagnosis not present

## 2014-12-01 DIAGNOSIS — G4733 Obstructive sleep apnea (adult) (pediatric): Secondary | ICD-10-CM | POA: Insufficient documentation

## 2014-12-01 HISTORY — DX: Adverse effect of unspecified anesthetic, initial encounter: T41.45XA

## 2014-12-01 HISTORY — DX: Other complications of anesthesia, initial encounter: T88.59XA

## 2014-12-01 LAB — BASIC METABOLIC PANEL
Anion gap: 6 (ref 5–15)
BUN: 20 mg/dL (ref 6–20)
CO2: 26 mmol/L (ref 22–32)
Calcium: 8.9 mg/dL (ref 8.9–10.3)
Chloride: 106 mmol/L (ref 101–111)
Creatinine, Ser: 1.09 mg/dL (ref 0.61–1.24)
GFR calc Af Amer: >60 mL/min (ref >60)
GFR calc non Af Amer: >60 mL/min (ref >60)
Glucose, Bld: 171 mg/dL — ABNORMAL HIGH (ref 65–99)
Potassium: 3.7 mmol/L (ref 3.5–5.1)
Sodium: 138 mmol/L (ref 135–145)

## 2014-12-01 LAB — CBC
HCT: 42.6 % (ref 39.0–52.0)
Hemoglobin: 15 g/dL (ref 13.0–17.0)
MCH: 29.9 pg (ref 26.0–34.0)
MCHC: 35.2 g/dL (ref 30.0–36.0)
MCV: 84.9 fL (ref 78.0–100.0)
Platelets: 169 10*3/uL (ref 150–400)
RBC: 5.02 MIL/uL (ref 4.22–5.81)
RDW: 13.9 % (ref 11.5–15.5)
WBC: 7.8 10*3/uL (ref 4.0–10.5)

## 2014-12-01 LAB — SURGICAL PCR SCREEN
MRSA, PCR: NEGATIVE
Staphylococcus aureus: POSITIVE — AB

## 2014-12-01 NOTE — Progress Notes (Signed)
Anesthesia Chart Review:  Pt is 79 year old male scheduled for redo thoracic laminectomy with intramedullary tumor resection/ USN/ [place]/ subarachnoid shunt/ spinal cord monitoring on 12/09/2014 with Dr. Vertell Limber, Dr. Kathyrn Sheriff to assist.   PCP is Dr. Lavone Orn. Cardiologist is Dr. Glenetta Hew  PMH includes: CAD (DES to CX x2 in [id number]), diastolic dysfunction without HF, HTN, heart murmur, DM, OSA (bipap), prostate cancer, melanoma. S/p thoracic laminectomy for resection of spinal cord tumor 07/13/12. S/p L hydrocelectomy 10/14/11.   Anesthesia history includes taking a "long time to wake up".  Medications include: amlodipine, ASA, plavix, crestor, valsartan. Last plavix will be on 12/02/14.   Preoperative labs reviewed. HgbA1c pending, glucose 171.  EKG 09/16/2014: sinus rhythm with PACs.   Nuclear stress test 09/26/2014:   The study is normal.  This is a low risk study.  There was no ST segment deviation noted during stress.  No T wave inversion was noted during stress.  Frequent PAC's with transint atrial bigeminy and rare PVC.  Low risk lexiscan myocardial perfusion study demonstrating normal perfusion without scar or ischemia. Gating not done due to occasional to frequent ectopy with PAC's and transient atrial bigeminy with rare PVC.  Echo [id number]:  -LV systolic function normal. EF = >55% -The transmitral spectral Doppler flow pattern is suggestive of impaired LV relaxation -There is mild LVH -The LA is mildly dilated -There is mild tricuspid regurgitation -RV systolic pressure is normal  Cardiac cath 09/01/2004: 1. Multiple lesions in mid and left circumflex coronary artery by cardiac cath within the past week (done at [facility]). 2. Successful percutaneous stenting of mid and distal circumflex with reduction of greater than 75% lesions in three locations to 0% residual. -Notes from Dr. Ellyn Hack in media tab (labeled AMB correspondence dated 09/25/12) indicate  rest of L and R coronary system without significant disease.   Pt has cardiac clearance for surgery from Dr. Ellyn Hack in comment on stress test report.  If no changes, I anticipate pt can proceed with surgery as scheduled.   Willeen Cass, FNP-BC [facility] Short Stay Surgical Center/Anesthesiology Phone: [phone] 12/01/2014 3:22 PM

## 2014-12-02 LAB — HEMOGLOBIN A1C
Hgb A1c MFr Bld: 6.9 % — ABNORMAL HIGH (ref 4.8–5.6)
Mean Plasma Glucose: 151 mg/dL

## 2014-12-08 NOTE — Anesthesia Preprocedure Evaluation (Addendum)
Anesthesia Evaluation  Patient identified by MRN, date of birth, ID band Patient awake    Reviewed: Allergy & Precautions, NPO status , Patient's Chart, lab work & pertinent test results  History of Anesthesia Complications (+) history of anesthetic complications ("slow to emerge")  Airway Mallampati: III  TM Distance: >3 FB Neck ROM: Full    Dental no notable dental hx. (+) Dental Advisory Given   Pulmonary sleep apnea and Continuous Positive Airway Pressure Ventilation ,  breath sounds clear to auscultation  Pulmonary exam normal       Cardiovascular hypertension, Pt. on medications + CAD and + Cardiac Stents Normal cardiovascular examRhythm:Regular Rate:Normal  09/2014: Normal stress test, denies any further chest pain or symptoms. Clearance for surgery.    Neuro/Psych PSYCHIATRIC DISORDERS Anxiety Depression negative neurological ROS     GI/Hepatic negative GI ROS, Neg liver ROS,   Endo/Other  diabetes  Renal/GU negative Renal ROS  negative genitourinary   Musculoskeletal  (+) Arthritis -,   Abdominal   Peds negative pediatric ROS (+)  Hematology negative hematology ROS (+)   Anesthesia Other Findings Numbness in bilateral lower extremities to rectum  Reproductive/Obstetrics negative OB ROS                          Anesthesia Physical Anesthesia Plan  ASA: III  Anesthesia Plan: General   Post-op Pain Management:    Induction: Intravenous  Airway Management Planned: Oral ETT  Additional Equipment:   Intra-op Plan:   Post-operative Plan:   Informed Consent: I have reviewed the patients History and Physical, chart, labs and discussed the procedure including the risks, benefits and alternatives for the proposed anesthesia with the patient or authorized representative who has indicated his/her understanding and acceptance.   Dental advisory given  Plan Discussed with:    Anesthesia Plan Comments:       Anesthesia Quick Evaluation

## 2014-12-09 ENCOUNTER — Inpatient Hospital Stay (HOSPITAL_COMMUNITY)
Admission: RE | Admit: 2014-12-09 | Discharge: 2014-12-12 | DRG: 029 | Disposition: A | Discharge status: Rehabilitation Facility | Payer: Medicare Other | Source: Ambulatory Visit | Attending: Neurosurgery | Admitting: Neurosurgery

## 2014-12-09 ENCOUNTER — Inpatient Hospital Stay (HOSPITAL_COMMUNITY): Payer: Medicare Other

## 2014-12-09 ENCOUNTER — Encounter (HOSPITAL_COMMUNITY)
Admission: RE | Disposition: A | Discharge status: Rehabilitation Facility | Payer: Self-pay | Source: Ambulatory Visit | Attending: Neurosurgery

## 2014-12-09 ENCOUNTER — Encounter (HOSPITAL_COMMUNITY): Payer: Self-pay | Admitting: *Deleted

## 2014-12-09 ENCOUNTER — Inpatient Hospital Stay (HOSPITAL_COMMUNITY): Payer: Medicare Other | Admitting: Vascular Surgery

## 2014-12-09 ENCOUNTER — Inpatient Hospital Stay (HOSPITAL_COMMUNITY): Payer: Medicare Other | Admitting: Anesthesiology

## 2014-12-09 DIAGNOSIS — Z7982 Long term (current) use of aspirin: Secondary | ICD-10-CM

## 2014-12-09 DIAGNOSIS — I10 Essential (primary) hypertension: Secondary | ICD-10-CM | POA: Diagnosis not present

## 2014-12-09 DIAGNOSIS — M5184 Other intervertebral disc disorders, thoracic region: Secondary | ICD-10-CM | POA: Diagnosis not present

## 2014-12-09 DIAGNOSIS — M4714 Other spondylosis with myelopathy, thoracic region: Secondary | ICD-10-CM | POA: Diagnosis not present

## 2014-12-09 DIAGNOSIS — Z79899 Other long term (current) drug therapy: Secondary | ICD-10-CM

## 2014-12-09 DIAGNOSIS — M545 Low back pain: Secondary | ICD-10-CM | POA: Diagnosis not present

## 2014-12-09 DIAGNOSIS — M549 Dorsalgia, unspecified: Secondary | ICD-10-CM | POA: Diagnosis present

## 2014-12-09 DIAGNOSIS — G8220 Paraplegia, unspecified: Secondary | ICD-10-CM | POA: Diagnosis not present

## 2014-12-09 DIAGNOSIS — D492 Neoplasm of unspecified behavior of bone, soft tissue, and skin: Secondary | ICD-10-CM | POA: Diagnosis not present

## 2014-12-09 DIAGNOSIS — C720 Malignant neoplasm of spinal cord: Principal | ICD-10-CM | POA: Diagnosis present

## 2014-12-09 DIAGNOSIS — K592 Neurogenic bowel, not elsewhere classified: Secondary | ICD-10-CM | POA: Diagnosis not present

## 2014-12-09 DIAGNOSIS — G8222 Paraplegia, incomplete: Secondary | ICD-10-CM | POA: Diagnosis not present

## 2014-12-09 DIAGNOSIS — Z888 Allergy status to other drugs, medicaments and biological substances status: Secondary | ICD-10-CM | POA: Diagnosis not present

## 2014-12-09 DIAGNOSIS — D497 Neoplasm of unspecified behavior of endocrine glands and other parts of nervous system: Secondary | ICD-10-CM | POA: Diagnosis not present

## 2014-12-09 DIAGNOSIS — R229 Localized swelling, mass and lump, unspecified: Secondary | ICD-10-CM

## 2014-12-09 DIAGNOSIS — R319 Hematuria, unspecified: Secondary | ICD-10-CM | POA: Diagnosis not present

## 2014-12-09 DIAGNOSIS — E1165 Type 2 diabetes mellitus with hyperglycemia: Secondary | ICD-10-CM | POA: Diagnosis not present

## 2014-12-09 DIAGNOSIS — G9619 Other disorders of meninges, not elsewhere classified: Secondary | ICD-10-CM | POA: Diagnosis not present

## 2014-12-09 HISTORY — PX: LAMINECTOMY: SHX219

## 2014-12-09 LAB — URINE MICROSCOPIC-ADD ON

## 2014-12-09 LAB — URINALYSIS, ROUTINE W REFLEX MICROSCOPIC
Bilirubin Urine: NEGATIVE
Glucose, UA: 250 mg/dL — AB
Ketones, ur: NEGATIVE mg/dL
Leukocytes, UA: NEGATIVE
Nitrite: NEGATIVE
Protein, ur: NEGATIVE mg/dL
Specific Gravity, Urine: 1.018 (ref 1.005–1.030)
Urobilinogen, UA: 0.2 mg/dL (ref 0.0–1.0)
pH: 5 (ref 5.0–8.0)

## 2014-12-09 LAB — GLUCOSE, CAPILLARY
Glucose-Capillary: 141 mg/dL — ABNORMAL HIGH (ref 65–99)
Glucose-Capillary: 169 mg/dL — ABNORMAL HIGH (ref 65–99)
Glucose-Capillary: 212 mg/dL — ABNORMAL HIGH (ref 65–99)
Glucose-Capillary: 154 mg/dL — ABNORMAL HIGH (ref 65–99)

## 2014-12-09 SURGERY — THORACIC LAMINECTOMY FOR TUMOR
Anesthesia: General | Site: Thoracic

## 2014-12-09 MED ORDER — KCL IN DEXTROSE-NACL 20-5-0.45 MEQ/L-%-% IV SOLN
INTRAVENOUS | Status: DC
  Filled 2014-12-09 (×3): qty 1000

## 2014-12-09 MED ORDER — PHENOL 1.4 % MT LIQD: 1.0000 | OROMUCOSAL | Status: DC | PRN

## 2014-12-09 MED ORDER — ROCURONIUM BROMIDE 50 MG/5ML IV SOLN
INTRAVENOUS | Status: AC
  Filled 2014-12-09: qty 1

## 2014-12-09 MED ORDER — LIDOCAINE HCL (CARDIAC) 20 MG/ML IV SOLN
INTRAVENOUS | Status: AC
  Filled 2014-12-09: qty 5

## 2014-12-09 MED ORDER — FENTANYL CITRATE (PF) 100 MCG/2ML IJ SOLN
INTRAMUSCULAR | Status: DC | PRN
  Administered 2014-12-09 (×2): 50 ug via INTRAVENOUS
  Administered 2014-12-09: 100 ug via INTRAVENOUS
  Administered 2014-12-09: 50 ug via INTRAVENOUS

## 2014-12-09 MED ORDER — FLEET ENEMA 7-19 GM/118ML RE ENEM: 1.0000 | ENEMA | Freq: Once | RECTAL | Status: DC | PRN

## 2014-12-09 MED ORDER — ONDANSETRON HCL 4 MG/2ML IJ SOLN
INTRAMUSCULAR | Status: DC | PRN
  Administered 2014-12-09: 4 mg via INTRAVENOUS

## 2014-12-09 MED ORDER — LIDOCAINE-EPINEPHRINE 1 %-1:100000 IJ SOLN
INTRAMUSCULAR | Status: DC | PRN
  Administered 2014-12-09: 10 mL

## 2014-12-09 MED ORDER — DEXAMETHASONE SODIUM PHOSPHATE 4 MG/ML IJ SOLN
INTRAMUSCULAR | Status: DC | PRN
  Administered 2014-12-09: 10 mg via INTRAVENOUS

## 2014-12-09 MED ORDER — OXYBUTYNIN CHLORIDE ER 15 MG PO TB24
15.0000 mg | ORAL_TABLET | Freq: Every day | ORAL | Status: DC
  Administered 2014-12-09 – 2014-12-11 (×3): 15 mg via ORAL
  Filled 2014-12-09 (×5): qty 1

## 2014-12-09 MED ORDER — PROPOFOL INFUSION 10 MG/ML OPTIME
INTRAVENOUS | Status: DC | PRN
  Administered 2014-12-09: 100 ug/kg/min via INTRAVENOUS

## 2014-12-09 MED ORDER — PROPOFOL 10 MG/ML IV BOLUS
INTRAVENOUS | Status: AC
  Filled 2014-12-09: qty 20

## 2014-12-09 MED ORDER — ACETAMINOPHEN 325 MG PO TABS: 650.0000 mg | ORAL_TABLET | ORAL | Status: DC | PRN

## 2014-12-09 MED ORDER — ASPIRIN EC 81 MG PO TBEC
81.0000 mg | DELAYED_RELEASE_TABLET | Freq: Every day | ORAL | Status: DC
Start: 2014-12-10 — End: 2014-12-12
  Administered 2014-12-10 – 2014-12-12 (×3): 81 mg via ORAL
  Filled 2014-12-09 (×3): qty 1

## 2014-12-09 MED ORDER — SODIUM CHLORIDE 0.9 % IJ SOLN
3.0000 mL | Freq: Two times a day (BID) | INTRAMUSCULAR | Status: DC
  Administered 2014-12-09 – 2014-12-12 (×5): 3 mL via INTRAVENOUS

## 2014-12-09 MED ORDER — PHENYLEPHRINE HCL 10 MG/ML IJ SOLN
10.0000 mg | INTRAVENOUS | Status: DC | PRN
  Administered 2014-12-09: 15 ug/min via INTRAVENOUS

## 2014-12-09 MED ORDER — EPHEDRINE SULFATE 50 MG/ML IJ SOLN
INTRAMUSCULAR | Status: DC | PRN
  Administered 2014-12-09: 5 mg via INTRAVENOUS

## 2014-12-09 MED ORDER — THROMBIN 5000 UNITS EX SOLR
OROMUCOSAL | Status: DC | PRN
  Administered 2014-12-09: 10 mL via TOPICAL

## 2014-12-09 MED ORDER — INSULIN ASPART 100 UNIT/ML ~~LOC~~ SOLN
0.0000 [IU] | SUBCUTANEOUS | Status: DC
  Administered 2014-12-09: 5 [IU] via SUBCUTANEOUS
  Administered 2014-12-10 (×6): 3 [IU] via SUBCUTANEOUS
  Administered 2014-12-11 (×3): 5 [IU] via SUBCUTANEOUS
  Administered 2014-12-11: 3 [IU] via SUBCUTANEOUS
  Administered 2014-12-11: 5 [IU] via SUBCUTANEOUS
  Administered 2014-12-11 – 2014-12-12 (×2): 3 [IU] via SUBCUTANEOUS
  Administered 2014-12-12: 8 [IU] via SUBCUTANEOUS
  Administered 2014-12-12 (×2): 3 [IU] via SUBCUTANEOUS

## 2014-12-09 MED ORDER — GLYCOPYRROLATE 0.2 MG/ML IJ SOLN
INTRAMUSCULAR | Status: DC | PRN
  Administered 2014-12-09: 0.2 mg via INTRAVENOUS

## 2014-12-09 MED ORDER — HYDROMORPHONE HCL 1 MG/ML IJ SOLN
0.5000 mg | INTRAMUSCULAR | Status: DC | PRN
  Administered 2014-12-09: 1 mg via INTRAVENOUS
  Filled 2014-12-09: qty 1

## 2014-12-09 MED ORDER — CEFAZOLIN SODIUM-DEXTROSE 2-3 GM-% IV SOLR
2.0000 g | Freq: Three times a day (TID) | INTRAVENOUS | Status: AC
  Administered 2014-12-09 (×2): 2 g via INTRAVENOUS
  Filled 2014-12-09 (×2): qty 50

## 2014-12-09 MED ORDER — AMLODIPINE BESYLATE 10 MG PO TABS
10.0000 mg | ORAL_TABLET | Freq: Every day | ORAL | Status: DC
Start: 2014-12-10 — End: 2014-12-12
  Administered 2014-12-10 – 2014-12-12 (×3): 10 mg via ORAL
  Filled 2014-12-09 (×3): qty 1

## 2014-12-09 MED ORDER — ROSUVASTATIN CALCIUM 20 MG PO TABS
20.0000 mg | ORAL_TABLET | Freq: Every day | ORAL | Status: DC
  Administered 2014-12-10 – 2014-12-12 (×3): 20 mg via ORAL
  Filled 2014-12-09 (×3): qty 1

## 2014-12-09 MED ORDER — METHOCARBAMOL 1000 MG/10ML IJ SOLN
500.0000 mg | Freq: Four times a day (QID) | INTRAMUSCULAR | Status: DC | PRN
  Filled 2014-12-09: qty 5

## 2014-12-09 MED ORDER — ONDANSETRON HCL 4 MG/2ML IJ SOLN
INTRAMUSCULAR | Status: AC
  Filled 2014-12-09: qty 2

## 2014-12-09 MED ORDER — LACTATED RINGERS IV SOLN
INTRAVENOUS | Status: DC | PRN
  Administered 2014-12-09 (×2): via INTRAVENOUS

## 2014-12-09 MED ORDER — INFLUENZA VAC SPLIT QUAD 0.5 ML IM SUSY
0.5000 mL | PREFILLED_SYRINGE | INTRAMUSCULAR | Status: AC
  Administered 2014-12-10: 0.5 mL via INTRAMUSCULAR
  Filled 2014-12-09 (×2): qty 0.5

## 2014-12-09 MED ORDER — ALLOPURINOL 100 MG PO TABS
300.0000 mg | ORAL_TABLET | Freq: Every day | ORAL | Status: DC
  Administered 2014-12-10 – 2014-12-12 (×3): 300 mg via ORAL
  Filled 2014-12-09 (×2): qty 3
  Filled 2014-12-09: qty 1

## 2014-12-09 MED ORDER — SODIUM CHLORIDE 0.9 % IV SOLN: 250.0000 mL | INTRAVENOUS | Status: DC

## 2014-12-09 MED ORDER — MIDAZOLAM HCL 2 MG/2ML IJ SOLN
INTRAMUSCULAR | Status: AC
  Filled 2014-12-09: qty 4

## 2014-12-09 MED ORDER — DEXAMETHASONE SODIUM PHOSPHATE 4 MG/ML IJ SOLN
4.0000 mg | Freq: Four times a day (QID) | INTRAMUSCULAR | Status: DC
  Administered 2014-12-09 – 2014-12-10 (×3): 4 mg via INTRAVENOUS
  Filled 2014-12-09 (×7): qty 1

## 2014-12-09 MED ORDER — POTASSIUM CHLORIDE IN NACL 20-0.9 MEQ/L-% IV SOLN
INTRAVENOUS | Status: DC
  Administered 2014-12-09 – 2014-12-10 (×2): via INTRAVENOUS
  Filled 2014-12-09 (×3): qty 1000

## 2014-12-09 MED ORDER — ALUM & MAG HYDROXIDE-SIMETH 200-200-20 MG/5ML PO SUSP
30.0000 mL | Freq: Four times a day (QID) | ORAL | Status: DC | PRN
Start: 2014-12-09 — End: 2014-12-12

## 2014-12-09 MED ORDER — POLYETHYLENE GLYCOL 3350 17 G PO PACK
17.0000 g | PACK | Freq: Every day | ORAL | Status: DC | PRN
  Administered 2014-12-11: 17 g via ORAL
  Filled 2014-12-09: qty 1

## 2014-12-09 MED ORDER — ACETAMINOPHEN 650 MG RE SUPP: 650.0000 mg | RECTAL | Status: DC | PRN

## 2014-12-09 MED ORDER — ONDANSETRON HCL 4 MG/2ML IJ SOLN: 4.0000 mg | INTRAMUSCULAR | Status: DC | PRN

## 2014-12-09 MED ORDER — METHOCARBAMOL 500 MG PO TABS: 500.0000 mg | ORAL_TABLET | Freq: Four times a day (QID) | ORAL | Status: DC | PRN

## 2014-12-09 MED ORDER — 0.9 % SODIUM CHLORIDE (POUR BTL) OPTIME
TOPICAL | Status: DC | PRN
  Administered 2014-12-09: 1000 mL

## 2014-12-09 MED ORDER — DEXAMETHASONE 4 MG PO TABS
4.0000 mg | ORAL_TABLET | Freq: Four times a day (QID) | ORAL | Status: DC
  Administered 2014-12-10 – 2014-12-12 (×9): 4 mg via ORAL
  Filled 2014-12-09 (×14): qty 1

## 2014-12-09 MED ORDER — MENTHOL 3 MG MT LOZG: 1.0000 | LOZENGE | OROMUCOSAL | Status: DC | PRN

## 2014-12-09 MED ORDER — LIDOCAINE HCL (CARDIAC) 20 MG/ML IV SOLN
INTRAVENOUS | Status: DC | PRN
  Administered 2014-12-09: 60 mg via INTRAVENOUS

## 2014-12-09 MED ORDER — THROMBIN 20000 UNITS EX SOLR
CUTANEOUS | Status: DC | PRN
  Administered 2014-12-09: 20 mL via TOPICAL

## 2014-12-09 MED ORDER — OXYCODONE-ACETAMINOPHEN 5-325 MG PO TABS
1.0000 | ORAL_TABLET | ORAL | Status: DC | PRN
  Administered 2014-12-09: 2 via ORAL
  Filled 2014-12-09: qty 2

## 2014-12-09 MED ORDER — DOCUSATE SODIUM 100 MG PO CAPS
100.0000 mg | ORAL_CAPSULE | Freq: Two times a day (BID) | ORAL | Status: DC
  Administered 2014-12-09 – 2014-12-12 (×6): 100 mg via ORAL
  Filled 2014-12-09 (×7): qty 1

## 2014-12-09 MED ORDER — HYDROCODONE-ACETAMINOPHEN 5-325 MG PO TABS
1.0000 | ORAL_TABLET | ORAL | Status: DC | PRN
  Administered 2014-12-09: 2 via ORAL
  Filled 2014-12-09: qty 2

## 2014-12-09 MED ORDER — SUCCINYLCHOLINE CHLORIDE 20 MG/ML IJ SOLN
INTRAMUSCULAR | Status: DC | PRN
  Administered 2014-12-09: 100 mg via INTRAVENOUS

## 2014-12-09 MED ORDER — SODIUM CHLORIDE 0.9 % IJ SOLN: 3.0000 mL | INTRAMUSCULAR | Status: DC | PRN

## 2014-12-09 MED ORDER — PANTOPRAZOLE SODIUM 40 MG IV SOLR
40.0000 mg | Freq: Every day | INTRAVENOUS | Status: DC
Start: 2014-12-09 — End: 2014-12-10
  Administered 2014-12-09: 40 mg via INTRAVENOUS
  Filled 2014-12-09 (×2): qty 40

## 2014-12-09 MED ORDER — BISACODYL 10 MG RE SUPP: 10.0000 mg | Freq: Every day | RECTAL | Status: DC | PRN

## 2014-12-09 MED ORDER — CEFAZOLIN SODIUM-DEXTROSE 2-3 GM-% IV SOLR
2.0000 g | INTRAVENOUS | Status: AC
  Administered 2014-12-09: 2 g via INTRAVENOUS
  Filled 2014-12-09: qty 50

## 2014-12-09 MED ORDER — FENTANYL CITRATE (PF) 250 MCG/5ML IJ SOLN
INTRAMUSCULAR | Status: AC
  Filled 2014-12-09: qty 5

## 2014-12-09 MED ORDER — PROPOFOL 10 MG/ML IV BOLUS
INTRAVENOUS | Status: DC | PRN
  Administered 2014-12-09: 100 mg via INTRAVENOUS
  Administered 2014-12-09: 20 mg via INTRAVENOUS

## 2014-12-09 MED ORDER — ESCITALOPRAM OXALATE 10 MG PO TABS
10.0000 mg | ORAL_TABLET | Freq: Every day | ORAL | Status: DC
  Administered 2014-12-10 – 2014-12-12 (×3): 10 mg via ORAL
  Filled 2014-12-09 (×3): qty 1

## 2014-12-09 MED ORDER — FENTANYL CITRATE (PF) 100 MCG/2ML IJ SOLN: 25.0000 ug | INTRAMUSCULAR | Status: DC | PRN

## 2014-12-09 MED ORDER — IRBESARTAN 150 MG PO TABS
75.0000 mg | ORAL_TABLET | Freq: Every day | ORAL | Status: DC
  Administered 2014-12-09 – 2014-12-12 (×4): 75 mg via ORAL
  Filled 2014-12-09 (×4): qty 1

## 2014-12-09 MED ORDER — TAMSULOSIN HCL 0.4 MG PO CAPS
0.4000 mg | ORAL_CAPSULE | Freq: Every day | ORAL | Status: DC
  Administered 2014-12-09 – 2014-12-11 (×3): 0.4 mg via ORAL
  Filled 2014-12-09 (×4): qty 1

## 2014-12-09 MED ORDER — ONDANSETRON HCL 4 MG/2ML IJ SOLN: 4.0000 mg | Freq: Once | INTRAMUSCULAR | Status: DC | PRN

## 2014-12-09 MED ORDER — BUPIVACAINE HCL (PF) 0.5 % IJ SOLN
INTRAMUSCULAR | Status: DC | PRN
  Administered 2014-12-09: 10 mL

## 2014-12-09 SURGICAL SUPPLY — 87 items
ADH SKN CLS APL DERMABOND .7 (GAUZE/BANDAGES/DRESSINGS) ×2
APL SKNCLS STERI-STRIP NONHPOA (GAUZE/BANDAGES/DRESSINGS)
APL SRG 60D 8 XTD TIP BNDBL (TIP) ×1
BENZOIN TINCTURE PRP APPL 2/3 (GAUZE/BANDAGES/DRESSINGS) IMPLANT
BLADE CLIPPER SURG (BLADE) ×1 IMPLANT
BLADE SURG 11 STRL SS (BLADE) ×1 IMPLANT
BLADE ULTRA TIP 2M (BLADE) IMPLANT
BRUSH SCRUB EZ PLAIN DRY (MISCELLANEOUS) ×2 IMPLANT
BUR MATCHSTICK NEURO 3.0 LAGG (BURR) ×1 IMPLANT
CANISTER SUCT 3000ML PPV (MISCELLANEOUS) ×2 IMPLANT
CONT SPEC STER OR (MISCELLANEOUS) ×1 IMPLANT
DECANTER SPIKE VIAL GLASS SM (MISCELLANEOUS) ×2 IMPLANT
DERMABOND ADVANCED (GAUZE/BANDAGES/DRESSINGS) ×2
DERMABOND ADVANCED .7 DNX12 (GAUZE/BANDAGES/DRESSINGS) IMPLANT
DRAPE CAMERA VIDEO/LASER (DRAPES) ×1 IMPLANT
DRAPE LAPAROTOMY 100X72 PEDS (DRAPES) ×1 IMPLANT
DRAPE LAPAROTOMY 100X72X124 (DRAPES) ×1 IMPLANT
DRAPE MICROSCOPE LEICA (MISCELLANEOUS) ×2 IMPLANT
DRAPE ORTHO SPLIT 77X108 STRL (DRAPES)
DRAPE POUCH INSTRU U-SHP 10X18 (DRAPES) ×2 IMPLANT
DRAPE PROXIMA HALF (DRAPES) ×1 IMPLANT
DRAPE SURG ORHT 6 SPLT 77X108 (DRAPES) IMPLANT
DRSG OPSITE POSTOP 4X8 (GAUZE/BANDAGES/DRESSINGS) ×1 IMPLANT
DURAPREP 6ML APPLICATOR 50/CS (WOUND CARE) ×1 IMPLANT
DURASEAL APPLICATOR TIP (TIP) ×1 IMPLANT
DURASEAL SPINE SEALANT 3ML (MISCELLANEOUS) ×1 IMPLANT
ELECT REM PT RETURN 9FT ADLT (ELECTROSURGICAL) ×2
ELECTRODE REM PT RTRN 9FT ADLT (ELECTROSURGICAL) ×1 IMPLANT
GAUZE SPONGE 4X4 12PLY STRL (GAUZE/BANDAGES/DRESSINGS) ×2 IMPLANT
GAUZE SPONGE 4X4 16PLY XRAY LF (GAUZE/BANDAGES/DRESSINGS) ×1 IMPLANT
GLOVE BIO SURGEON STRL SZ8 (GLOVE) ×3 IMPLANT
GLOVE BIOGEL PI IND STRL 7.5 (GLOVE) IMPLANT
GLOVE BIOGEL PI IND STRL 8 (GLOVE) ×1 IMPLANT
GLOVE BIOGEL PI IND STRL 8.5 (GLOVE) ×1 IMPLANT
GLOVE BIOGEL PI INDICATOR 7.5 (GLOVE) ×1
GLOVE BIOGEL PI INDICATOR 8 (GLOVE) ×1
GLOVE BIOGEL PI INDICATOR 8.5 (GLOVE) ×1
GLOVE ECLIPSE 7.0 STRL STRAW (GLOVE) ×1 IMPLANT
GLOVE ECLIPSE 7.5 STRL STRAW (GLOVE) ×5 IMPLANT
GLOVE ECLIPSE 8.0 STRL XLNG CF (GLOVE) ×1 IMPLANT
GLOVE EXAM NITRILE LRG STRL (GLOVE) IMPLANT
GLOVE EXAM NITRILE MD LF STRL (GLOVE) ×1 IMPLANT
GLOVE EXAM NITRILE XL STR (GLOVE) IMPLANT
GLOVE EXAM NITRILE XS STR PU (GLOVE) ×2 IMPLANT
GOWN STRL REUS W/ TWL LRG LVL3 (GOWN DISPOSABLE) IMPLANT
GOWN STRL REUS W/ TWL XL LVL3 (GOWN DISPOSABLE) IMPLANT
GOWN STRL REUS W/TWL 2XL LVL3 (GOWN DISPOSABLE) ×1 IMPLANT
GOWN STRL REUS W/TWL LRG LVL3 (GOWN DISPOSABLE)
GOWN STRL REUS W/TWL XL LVL3 (GOWN DISPOSABLE) ×2
HEMOSTAT SURGICEL 2X14 (HEMOSTASIS) IMPLANT
KIT BASIN OR (CUSTOM PROCEDURE TRAY) ×2 IMPLANT
KIT ROOM TURNOVER OR (KITS) ×2 IMPLANT
MARKER SKIN DUAL TIP RULER LAB (MISCELLANEOUS) ×1 IMPLANT
NDL HYPO 25X1 1.5 SAFETY (NEEDLE) ×1 IMPLANT
NDL SPNL 18GX3.5 QUINCKE PK (NEEDLE) IMPLANT
NDL SPNL 22GX3.5 QUINCKE BK (NEEDLE) ×1 IMPLANT
NEEDLE HYPO 25X1 1.5 SAFETY (NEEDLE) ×2 IMPLANT
NEEDLE SPNL 18GX3.5 QUINCKE PK (NEEDLE) IMPLANT
NEEDLE SPNL 22GX3.5 QUINCKE BK (NEEDLE) ×2 IMPLANT
NS IRRIG 1000ML POUR BTL (IV SOLUTION) ×2 IMPLANT
PACK LAMINECTOMY NEURO (CUSTOM PROCEDURE TRAY) ×2 IMPLANT
PATTIES SURGICAL .25X.25 (GAUZE/BANDAGES/DRESSINGS) ×1 IMPLANT
PATTIES SURGICAL .5 X.5 (GAUZE/BANDAGES/DRESSINGS) IMPLANT
PATTIES SURGICAL .5 X3 (DISPOSABLE) IMPLANT
PATTIES SURGICAL 1/4 X 3 (GAUZE/BANDAGES/DRESSINGS) ×1 IMPLANT
RUBBERBAND STERILE (MISCELLANEOUS) ×4 IMPLANT
SPONGE LAP 4X18 X RAY DECT (DISPOSABLE) IMPLANT
SPONGE NEURO XRAY DETECT 1X3 (DISPOSABLE) IMPLANT
SPONGE SURGIFOAM ABS GEL 100 (HEMOSTASIS) ×1 IMPLANT
STAPLER SKIN PROX WIDE 3.9 (STAPLE) ×2 IMPLANT
STRIP CLOSURE SKIN 1/2X4 (GAUZE/BANDAGES/DRESSINGS) IMPLANT
SUT NURALON 4 0 TR CR/8 (SUTURE) ×1 IMPLANT
SUT PROLENE 6 0 BV (SUTURE) ×1 IMPLANT
SUT SILK 6 0 BV 1XDISCX (SUTURE) IMPLANT
SUT VIC AB 0 CT1 18XCR BRD8 (SUTURE) ×1 IMPLANT
SUT VIC AB 0 CT1 8-18 (SUTURE) ×4
SUT VIC AB 2-0 CT1 18 (SUTURE) ×3 IMPLANT
SUT VIC AB 3-0 SH 8-18 (SUTURE) ×3 IMPLANT
TIP NONSTICK .5MMX23CM (INSTRUMENTS) ×2
TIP NONSTICK .5X23 (INSTRUMENTS) IMPLANT
TIP SONASTAR STD MISONIX 1.9 (TRAY / TRAY PROCEDURE) IMPLANT
TOWEL OR 17X24 6PK STRL BLUE (TOWEL DISPOSABLE) ×2 IMPLANT
TOWEL OR 17X26 10 PK STRL BLUE (TOWEL DISPOSABLE) ×2 IMPLANT
TRAY FOLEY CATH 16FRSI W/METER (SET/KITS/TRAYS/PACK) ×1 IMPLANT
TRAY FOLEY W/METER SILVER 14FR (SET/KITS/TRAYS/PACK) IMPLANT
UNDERPAD 30X30 INCONTINENT (UNDERPADS AND DIAPERS) ×1 IMPLANT
WATER STERILE IRR 1000ML POUR (IV SOLUTION) ×2 IMPLANT

## 2014-12-09 NOTE — Progress Notes (Signed)
Awake, alert, conversant.  MAEW with good power.  Full strength bilateral PF/DF.  Numbness is at baseline.

## 2014-12-09 NOTE — Care Management Note (Signed)
Case Management Note  Patient Details  Name: [name] MRN: [id number] Date of Birth: 1936/03/28  Subjective/Objective:  Pt admitted on 12/09/14 s/p redo thoracic laminectomy with intramedullary tumor resection.  PTA, pt resided at home with spouse.                Action/Plan: Will follow for discharge planning as pt progresses.    Expected Discharge Date:                  Expected Discharge Plan:  [place]  In-House Referral:     Discharge planning Services  CM Consult  Post Acute Care Choice:    Choice offered to:     DME Arranged:    DME Agency:     HH Arranged:    [place] Agency:     Status of Service:  In process, will continue to follow  Medicare Important Message Given:    Date Medicare IM Given:    Medicare IM give by:    Date Additional Medicare IM Given:    Additional Medicare Important Message give by:     If discussed at [place] of Stay Meetings, dates discussed:    Additional Comments:  Reinaldo Raddle, RN, BSN  Trauma/Neuro ICU Case Manager [phone]

## 2014-12-09 NOTE — Addendum Note (Signed)
Addendum  created 12/09/14 1407 by Clearnce Sorrel, CRNA   Modules edited: Anesthesia Flowsheet, Anesthesia Medication Administration

## 2014-12-09 NOTE — Progress Notes (Signed)
Extubated by Dr Jillyn Hidden at 1300. Pt tolerated well, suctioned. Able to take deep breaths and cough on command. Simple mask applied, O2 sats 95 with 5L. Dr Jillyn Hidden remained at bedside, will round on patient shortly before transferring to 8M.

## 2014-12-09 NOTE — Progress Notes (Signed)
RT called to PACU to place pt on Tbar.  Pt placed on Tbar 35% and tolerating well at this time.  Pt is still sleepy and not really following commands but is breathing on his own.  Pt has inline CO2 monitor hooked up and is ready 27-32.  RT will continue to monitor.

## 2014-12-09 NOTE — Brief Op Note (Signed)
12/09/2014  11:41 AM  PATIENT:  [name]  79 y.o. 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 Kathyrn Sheriff to assist (N/A) - Redo Thoracic laminectomy with intramedullary tumar resection, microdissection/USN/spinal cord monitoring/Dr. Kathyrn Sheriff to assist  SURGEON:  Surgeon(s) and Role:    * Erline Levine, MD - Primary    * Consuella Lose, MD - Assisting  PHYSICIAN ASSISTANT:   ASSISTANTS: none   ANESTHESIA:   general  EBL:  Total I/O In: 1000 [I.V.:1000] Out: 200 [Urine:150; Blood:50]  BLOOD ADMINISTERED:none  DRAINS: none   LOCAL MEDICATIONS USED:  MARCAINE     SPECIMEN:  Excision  DISPOSITION OF SPECIMEN:  PATHOLOGY  COUNTS:  YES  TOURNIQUET:  * No tourniquets in log *  DICTATION: DICTATION: Patient has spinal cord compression at T10-T11 due to intramedullary cystic mass along with thoracic spondylosis with bilateral leg weakness and numbness, thoracic radiculopathy and intractable pain. 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 It was elected to take him to surgery for T10-11 laminectomy and resection of recurrent intramedullary spinal cord tumor.  Procedure: Patient was brought to the operating room and following the smooth and uncomplicated induction of general endotracheal anesthesia he was placed in a prone position on flat rolls. Previous incision was used to localize the correct level. SSEP, MEPs and EMG recordings were performed throughout the case, including anal sphincter monitoring. His back was prepped and  draped in the usual sterile fashion with ChloraPrep. Area of planned incision was infiltrated with local lidocaine. Incision was made in the midline and carried to the thoacodorsal fascia which was incised bilaterally.  A total redo laminectomy of inferior T 9 to the top of T 12  was performed with leksell, and the spinal cord dura was defined. Ultrasound was used to confirm location of intramedullary mass lesion. The dura was opened to expose the spinal cord and the dura and arachnoid was tacked up. A cystic portion of the tumor was identified which was fungating out of previous myelotomy and this was resected.  A midline myelotomy was performed under microdissection and a cystic cavity in the spinal cord was entered and additional tumor was resected. Fibrinous and purplish discolored material was identified and then removed with micro instruments.  This was felt to be consistent with ependymoma.  Ultrasound was brought back in and confirmed that we had resected  the mass.  Additional caudal material was identified and removed.  At this point it was felt that all neural elements were well decompressed and ultrasound confirmed that the mass had been removed. Hemostasis was assured.The dura was closed with a running 6-0 prolene stitch in a water tight fashion. The wound was then irrigated with saline. Hemostasis was assured with gelfoam. The lumbodorsal fascia was closed with 0 Vicryl sutures the subcutaneous tissues reapproximated 2-0 Vicryl inverted sutures and the skin edges were reapproximated with 3-0  Running nylon stitch. The wound is dressed with Dermabond and an occlusive dressing. Patient was extubated in the operating room and taken to recovery in stable and satisfactory condition having tolerated his operation well counts were correct at the end of the case. Electrical recordings were preserved throughout the surgery.  PLAN OF  CARE: Admit to inpatient   PATIENT DISPOSITION:  PACU - hemodynamically  stable.   Delay start of Pharmacological VTE agent (>24hrs) due to surgical blood loss or risk of bleeding: yes

## 2014-12-09 NOTE — Progress Notes (Signed)
Awakeng slowly.  Still intubated, but moving air.  MAE to commands.  Good tone in both lower extremities.

## 2014-12-09 NOTE — Op Note (Signed)
12/09/2014  11:41 AM  PATIENT:  [name]  79 y.o. 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 Kathyrn Sheriff to assist (N/A) - Redo Thoracic laminectomy with intramedullary tumar resection, microdissection/USN/spinal cord monitoring/Dr. Kathyrn Sheriff to assist  SURGEON:  Surgeon(s) and Role:    * Erline Levine, MD - Primary    * Consuella Lose, MD - Assisting  PHYSICIAN ASSISTANT:   ASSISTANTS: none   ANESTHESIA:   general  EBL:  Total I/O In: 1000 [I.V.:1000] Out: 200 [Urine:150; Blood:50]  BLOOD ADMINISTERED:none  DRAINS: none   LOCAL MEDICATIONS USED:  MARCAINE     SPECIMEN:  Excision  DISPOSITION OF SPECIMEN:  PATHOLOGY  COUNTS:  YES  TOURNIQUET:  * No tourniquets in log *  DICTATION: DICTATION: Patient has spinal cord compression at T10-T11 due to intramedullary cystic mass along with thoracic spondylosis with bilateral leg weakness and numbness, thoracic radiculopathy and intractable pain. 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 It was elected to take him to surgery for T10-11 laminectomy and resection of recurrent intramedullary spinal cord tumor.  Procedure: Patient was brought to the operating room and following the smooth and uncomplicated induction of general endotracheal anesthesia he was placed in a prone position on flat rolls. Previous incision was used to localize the correct level. SSEP, MEPs and EMG recordings were performed throughout the case, including anal sphincter monitoring. His back was prepped and  draped in the usual sterile fashion with ChloraPrep. Area of planned incision was infiltrated with local lidocaine. Incision was made in the midline and carried to the thoacodorsal fascia which was incised bilaterally.  A total redo laminectomy of inferior T 9 to the top of T 12  was performed with leksell, and the spinal cord dura was defined. Ultrasound was used to confirm location of intramedullary mass lesion. The dura was opened to expose the spinal cord and the dura and arachnoid was tacked up. A cystic portion of the tumor was identified which was fungating out of previous myelotomy and this was resected.  A midline myelotomy was performed under microdissection and a cystic cavity in the spinal cord was entered and additional tumor was resected. Fibrinous and purplish discolored material was identified and then removed with micro instruments.  This was felt to be consistent with ependymoma.  Ultrasound was brought back in and confirmed that we had resected  the mass.  Additional caudal material was identified and removed.  At this point it was felt that all neural elements were well decompressed and ultrasound confirmed that the mass had been removed. Hemostasis was assured.The dura was closed with a running 6-0 prolene stitch in a water tight fashion. The wound was then irrigated with saline. Hemostasis was assured with gelfoam. The lumbodorsal fascia was closed with 0 Vicryl sutures the subcutaneous tissues reapproximated 2-0 Vicryl inverted sutures and the skin edges were reapproximated with 3-0  Running nylon stitch. The wound is dressed with Dermabond and an occlusive dressing. Patient was extubated in the operating room and taken to recovery in stable and satisfactory condition having tolerated his operation well counts were correct at the end of the case. Electrical recordings were preserved throughout the surgery.  PLAN OF  CARE: Admit to inpatient   PATIENT DISPOSITION:  PACU - hemodynamically  stable.   Delay start of Pharmacological VTE agent (>24hrs) due to surgical blood loss or risk of bleeding: yes

## 2014-12-09 NOTE — H&P (Signed)
Patient ID:   [id number] Patient: [name]  Date of Birth: 1935/11/03 Visit Type: Office Visit   Date: 09/03/2014 11:15 AM Provider: Marchia Meiers. Vertell Limber MD   This 79 year old male presents for back pain.  History of Present Illness: 1.  back pain  Patient is wife feel that he is walking differently and his numbness is now getting worse.  His numbness in his perineum is unchanged.  In the meantime his MRI of his thoracolumbar spine demonstrates that the cystic intraspinal tumor has enlarged and there is tracking edema cephalad to the cystic component.  In light of these changes he is inclined to proceed with surgery.  Patient also notes that he is having some left-sided chest pain and he and his wife called the cardiologist to go and see him tomorrow.  I spent in excess of 25 minutes with the patient and his wife reviewing his situation and exact nature of surgical procedure.  The plan will be to proceed with redo thoracic laminectomy with resection of intraspinal intramedullary tumor using ultrasound and possibly ultrasonic aspirator with possible shunt placement within the cystic cavity of the spinal cord along with spinal cord monitoring.  The patient and his wife have considerable travel plans in the next few months and want to go ahead with surgery in August.      Medical/Surgical/Interim History Reviewed, no change.  Last detailed document date:12/26/2012.   PAST MEDICAL HISTORY, SURGICAL HISTORY, FAMILY HISTORY, SOCIAL HISTORY AND REVIEW OF SYSTEMS I have reviewed the patient's past medical, surgical, family and social history as well as the comprehensive review of systems as included on the [facility] history form dated 04/14/2014, which I have signed.  Family History: Reviewed, no changes.  Last detailed document: 12/26/2012.   Social History: Tobacco use reviewed. Reviewed, no changes. Last detailed document date: 12/26/2012.       MEDICATIONS(added, continued or stopped this visit): Started Medication Directions Instruction Stopped   allopurinol 300 mg tablet take 1 tablet by oral route  every day     amlodipine 10 mg tablet take 1 tablet by oral route  every day     aspirin 81 mg tablet,delayed release take 1 tablet by oral route  every day     clopidogrel 75 mg tablet take 1 tablet by oral route  every day     Crestor 20 mg tablet take 1 tablet by oral route  every day     Lexapro 10 mg tablet take 1 tablet by oral route  every day     Lipitor 20 mg tablet take 1 tablet by oral route  every day     oxybutynin chloride ER 15 mg tablet,extended release 24 hr take 1 tablet by oral route  every day     tamsulosin ER 0.4 mg capsule,extended release 24 hr take 1 capsule by oral route  every day 1/2 hour following the same meal each day     valsartan 80 mg tablet take 1 tablet by oral route  every day       ALLERGIES: Ingredient Reaction Medication Name Comment  IODINE      Reviewed, no changes.    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  Proceed with re-do tumor resection and decompression of spinal cord cystic mass lesion.  Completed Orders (this encounter) Order Details Reason Side Interpretation Result Initial Treatment Date Region  Lifestyle education regarding diet Encouraged to eat a well balanced diet and follow up with primary care physician.         Assessment/Plan # Detail Type Description   1. Assessment Myelopathy of thoracic region (M47.14).       2. 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       3. Assessment Body mass index (BMI) 32.0-32.9, adult FP:[id number]).   Plan Orders Today's instructions / counseling include(s) Lifestyle education regarding diet.         Pain  Assessment/Treatment Pain Scale: 0/10. Method: Numeric Pain Intensity Scale. Location: back. Onset: 04/14/2013. Duration: varies. Quality: discomforting. Pain Assessment/Treatment follow-up plan of care: Patient is taking medications as prescribed..  Fall Risk Plan The patient has not fallen in the last year.  This is scheduled for early August.  Dr. Kathyrn Sheriff will be assisting.  Orders: Diagnostic Procedures: Assessment Procedure  D49.7 Redo Thoracic Laminectomy with intramedullary tumor resection, USN, CUSA, subarachnoid shunt, spinal cord monitoring, Nundkumar to assist  Instruction(s)/Education: Assessment Instruction  [phone] Lifestyle education regarding diet             Provider:  Marchia Meiers. Vertell Limber MD  09/04/2014 11:46 AM Dictation edited by: Marchia Meiers. Vertell Limber    CC Providers: [facility] [place],  [place]  [address]   [facility] [place], [place] [address]-              Electronically signed by Marchia Meiers Vertell Limber MD on 09/04/2014 11:46 AM

## 2014-12-09 NOTE — Progress Notes (Signed)
RN Ellie called me and stated that pt was desating. i blended O2 through his CPAP mask and increase his pressure to 8. Pt is tolerating well.  No complications noted.

## 2014-12-09 NOTE — Anesthesia Procedure Notes (Signed)
Procedure Name: Intubation Date/Time: 12/09/2014 7:38 AM Performed by: Clearnce Sorrel Pre-anesthesia Checklist: Patient identified, Timeout performed, Emergency Drugs available, Suction available and Patient being monitored Patient Re-evaluated:Patient Re-evaluated prior to inductionOxygen Delivery Method: Circle system utilized Preoxygenation: Pre-oxygenation with 100% oxygen Intubation Type: IV induction Ventilation: Oral airway inserted - appropriate to patient size and Two handed mask ventilation required Laryngoscope Size: Glidescope and 4 Grade View: Grade I Tube type: Oral Airway Equipment and Method: Stylet Placement Confirmation: ETT inserted through vocal cords under direct vision,  positive ETCO2 and breath sounds checked- equal and bilateral Secured at: 23 cm Tube secured with: Tape Dental Injury: Teeth and Oropharynx as per pre-operative assessment  Difficulty Due To: Difficulty was anticipated

## 2014-12-09 NOTE — Interval H&P Note (Signed)
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 After consideration of risks, benefits and other options for treatment, the patient has consented to  Procedure(s) with comments: Redo Thoracic laminectomy with intramedullary tumar resection/USN/[place]/Subarachnoid shunt/spinal cord monitoring/Dr. Kathyrn Sheriff to assist (N/A) - Redo Thoracic laminectomy with intramedullary tumar resection/USN/[place]/Subarachnoid shunt/spinal cord monitoring/Dr. Kathyrn Sheriff to assist as a surgical intervention .  The patient's history has been reviewed, patient examined, no change in status, stable for surgery.  I have reviewed the patient's chart and labs.  Questions were answered to the patient's satisfaction.     [name] D

## 2014-12-09 NOTE — Progress Notes (Signed)
Pt is on CPAP at this time, pt wear CPAP at home, pt states. His settings is 6. Pt states he wears no oxygen at home. Pt O2 saturations are 92% at this time.

## 2014-12-09 NOTE — Anesthesia Postprocedure Evaluation (Signed)
  Anesthesia Post-op Note  Patient: [name]  Procedure(s) Performed: Procedure(s) (LRB): Redo Thoracic laminectomy with intramedullary tumar resection/USN/[place]/Subarachnoid shunt/spinal cord monitoring/Dr. Kathyrn Sheriff to assist (N/A)  Patient Location: PACU  Anesthesia Type: General  Level of Consciousness: awake and alert   Airway and Oxygen Therapy: Patient Spontanous Breathing  Post-op Pain: mild  Post-op Assessment: Post-op Vital signs reviewed, Patient's Cardiovascular Status Stable, Respiratory Function Stable, Patent Airway and No signs of Nausea or vomiting  Last Vitals:  Filed Vitals:   12/09/14 1312  BP: 158/82  Pulse: 60  Temp:   Resp: 18    Post-op Vital Signs: stable   Complications: Prolonged emergence. Taken to PACU on a Tpiece. Required short time in PACU to remain intubated. Spontaneously breathing. Prior to extubation, follow commands and moving all extremities. Alert and oriented after extubation. Denies pain. Stable for continued care.

## 2014-12-09 NOTE — Transfer of Care (Signed)
Immediate Anesthesia Transfer of Care Note  Patient: [name]  Procedure(s) Performed: Procedure(s) with comments: Redo Thoracic laminectomy with intramedullary tumar resection/USN/[place]/Subarachnoid shunt/spinal cord monitoring/Dr. Kathyrn Sheriff to assist (N/A) - Redo Thoracic laminectomy with intramedullary tumar resection/USN/[place]/Subarachnoid shunt/spinal cord monitoring/Dr. Kathyrn Sheriff to assist  Patient Location: PACU  Anesthesia Type:General  Level of Consciousness: sedated  Airway & Oxygen Therapy: Patient Spontanous Breathing and Patient connected to T-piece oxygen  Post-op Assessment: Report given to RN and Post -op Vital signs reviewed and stable  Post vital signs: Reviewed and stable  Last Vitals:  Filed Vitals:   12/09/14 1209  BP: 137/85  Pulse: 70  Temp:   Resp: 16    Complications: No apparent anesthesia complications

## 2014-12-10 ENCOUNTER — Encounter (HOSPITAL_COMMUNITY): Payer: Self-pay | Admitting: Neurosurgery

## 2014-12-10 DIAGNOSIS — C720 Malignant neoplasm of spinal cord: Secondary | ICD-10-CM | POA: Diagnosis not present

## 2014-12-10 LAB — GLUCOSE, CAPILLARY
Glucose-Capillary: 157 mg/dL — ABNORMAL HIGH (ref 65–99)
Glucose-Capillary: 158 mg/dL — ABNORMAL HIGH (ref 65–99)
Glucose-Capillary: 162 mg/dL — ABNORMAL HIGH (ref 65–99)
Glucose-Capillary: 173 mg/dL — ABNORMAL HIGH (ref 65–99)
Glucose-Capillary: 175 mg/dL — ABNORMAL HIGH (ref 65–99)
Glucose-Capillary: 194 mg/dL — ABNORMAL HIGH (ref 65–99)
Glucose-Capillary: 195 mg/dL — ABNORMAL HIGH (ref 65–99)

## 2014-12-10 MED ORDER — PANTOPRAZOLE SODIUM 40 MG PO TBEC
40.0000 mg | DELAYED_RELEASE_TABLET | Freq: Every day | ORAL | Status: DC
  Administered 2014-12-10 – 2014-12-11 (×2): 40 mg via ORAL
  Filled 2014-12-10 (×2): qty 1

## 2014-12-10 MED ORDER — LORAZEPAM 1 MG PO TABS
1.0000 mg | ORAL_TABLET | Freq: Once | ORAL | Status: AC
  Administered 2014-12-11: 1 mg via ORAL
  Filled 2014-12-10 (×2): qty 1

## 2014-12-10 NOTE — Progress Notes (Signed)
Patient CPAP set up on 8. No O2 bleed in is required. Patient states he does not need assistance to put on mask and he will let RT know if he has any trouble.

## 2014-12-10 NOTE — Progress Notes (Signed)
Rehab Admissions Coordinator Note:  Patient was screened by Cleatrice Burke for appropriateness for an Inpatient Acute Rehab Consult per OT recommendation.   At this time, we are recommending Inpatient Rehab consult.  Cleatrice Burke 12/10/2014, 4:31 PM  I can be reached at [phone].

## 2014-12-10 NOTE — Progress Notes (Signed)
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 Pt states he was independent with mobility and ADL PTA. Pt with severely ataxic gait and is at high risk for falls. Feel pt would greatly benefit from rehab at CIR to maximize functional level of independence and facilitate a safe D/C home. Pt motivated to return to PLOF. Will follow acutely.     Follow Up Recommendations  CIR;Supervision/Assistance - 24 hour    Equipment Recommendations  Tub/shower bench;3 in 1 bedside comode    Recommendations for Other Services Rehab consult     Precautions / Restrictions Precautions Precautions: Fall Precaution Comments: laminectomy Restrictions Weight Bearing Restrictions: No      Mobility Bed Mobility Overal bed mobility: Needs Assistance Bed Mobility: Sit to Sidelying         Sit to sidelying: Supervision;HOB elevated (vc for sequencing)    Transfers Overall transfer level: Needs assistance Equipment used: Rolling walker (2 wheeled) Transfers: Sit to/from [facility] Sit to Stand: +2 physical assistance;Mod assist Stand pivot transfers: Mod assist;+2 physical assistance  Max A +2 with ambulation @ RW level. Pt unable to maintain position in RW. Appears unaware of position of RLE during any mobility.             Balance Overall balance assessment: Needs assistance   Sitting balance-Leahy Scale: Good     Standing balance support: During functional activity;Bilateral upper extremity supported Standing balance-Leahy Scale: Zero Standing balance comment: unable to stand unsupported                            ADL Overall ADL's : Needs assistance/impaired     Grooming: Set  up;Sitting   Upper Body Bathing: Set up;Sitting   Lower Body Bathing: Moderate assistance;Sit to/from stand   Upper Body Dressing : Set up;Sitting   Lower Body Dressing: Moderate assistance;Sit to/from stand   Toilet Transfer: +2 for physical assistance;Maximal assistance;RW;Ambulation   Toileting- Water quality scientist and Hygiene: Moderate assistance;Sit to/from stand       Functional mobility during ADLs: +2 for physical assistance;Maximal assistance;Rolling walker;Cueing for safety;Cueing for sequencing (atqaxic gait pattern) General ADL Comments: unsafe and high risk for falls during mobility for ADL. Impulsive at baseline.                     Pertinent Vitals/Pain Pain Assessment: Faces Faces Pain Scale: Hurts little more Pain Location: back/head Pain Descriptors / Indicators: Aching Pain Intervention(s): Limited activity within patient's tolerance;Monitored during session     Hand Dominance Left   Extremity/Trunk Assessment Upper Extremity Assessment Upper Extremity Assessment: Overall WFL for tasks assessed   Lower Extremity Assessment Lower Extremity Assessment: RLE deficits/detail;LLE deficits/detail RLE Deficits / Details: strength WFL. poor proprioception. decreased sensation. ataxic Appears more impaired than L RLE Sensation: decreased light touch;decreased proprioception RLE Coordination: decreased gross motor LLE Deficits / Details: strength WFL. poor sensation and proprioception. ataxic LLE Sensation: decreased light touch;decreased proprioception LLE Coordination: decreased gross motor   Cervical / Trunk Assessment Cervical / Trunk Assessment: Other exceptions (bacck surgery)   Communication     Cognition Arousal/Alertness: Awake/alert Behavior During Therapy: Impulsive Overall Cognitive Status: Within Functional Limits for tasks assessed  General Comments       Exercises       Shoulder Instructions       Home Living Family/patient expects to be discharged to:: Private residence Living Arrangements: Spouse/significant other Available Help at Discharge: Available 24 hours/day Type of Home: House Home Access: Stairs to enter [employer] of Steps: 3 Entrance Stairs-Rails: Left Home Layout: Multi-level Alternate Level Stairs-Number of Steps: 8 Alternate Level Stairs-Rails: Right Bathroom Shower/Tub: Teacher, early years/pre: Handicapped height Bathroom Accessibility: Yes How Accessible: Accessible via walker Home Equipment: Clinical cytogeneticist - 2 wheels   Additional Comments: wife's bathroom more accessible      Prior Functioning/Environment Level of Independence: Independent             OT Diagnosis: Generalized weakness;Acute pain;Ataxia   OT Problem List: Decreased activity tolerance;Impaired balance (sitting and/or standing);Decreased coordination;Decreased safety awareness;Decreased knowledge of use of DME or AE;Decreased knowledge of precautions;Impaired sensation;Pain;Decreased strength   OT Treatment/Interventions:      OT Goals(Current goals can be found in the care plan section) Acute Rehab OT Goals Patient Stated Goal: to be able to walk so I can work OT Goal Formulation: With patient Time For Goal Achievement: 12/24/14 Potential to Achieve Goals: Good ADL Goals Pt Will Perform Grooming: with min guard assist;standing Pt Will Perform Lower Body Bathing: with min guard assist;with caregiver independent in assisting;sit to/from stand;with adaptive equipment Pt Will Perform Lower Body Dressing: with min guard assist;with caregiver independent in assisting;sit to/from stand;with adaptive equipment Pt Will Transfer to Toilet: with min guard assist;ambulating;bedside commode Pt Will Perform Toileting - Clothing Manipulation and hygiene: sitting/lateral leans;with supervision  OT Frequency: Min 2X/week   Barriers to D/C:             Co-evaluation              End of Session Equipment Utilized During Treatment: Gait belt;Rolling walker Nurse Communication: Mobility status  Activity Tolerance: Patient tolerated treatment well Patient left: in bed;with call bell/phone within reach;with bed alarm set   Time: 1420-1436 OT Time Calculation (min): 16 min Charges:  OT General Charges $OT Visit: 1 Procedure OT Evaluation $Initial OT Evaluation Tier I: 1 Procedure G-Codes:    George Haggart,HILLARY Dec 22, 2014, 3:53 PM   [facility], OTR/L  [phone] December 22, 2014

## 2014-12-10 NOTE — Progress Notes (Signed)
Subjective: Patient reports feels good  Objective: Vital signs in last 24 hours: Temp:  [97.5 F (36.4 C)-98.5 F (36.9 C)] 98.5 F (36.9 C) (09/07 0400) Pulse Rate:  [48-80] 80 (09/07 0700) Resp:  [9-34] 34 (09/07 0700) BP: (129-158)/(51-85) 130/63 mmHg (09/07 0700) SpO2:  [90 %-99 %] 94 % (09/07 0700)  Intake/Output from previous day: 09/06 0701 - 09/07 0700 In: 2995 [P.O.:120; I.V.:2775; IV Piggyback:100] Out: [id number] [Urine:1221; Blood:100] Intake/Output this shift:    Physical Exam: Full strength both legs, denies increased perineal numbness.  Perhaps a bit more numbness in feet.  Dressing flat, CDI.  Lab Results: No results for input(s): WBC, HGB, HCT, PLT in the last 72 hours. BMET No results for input(s): NA, K, CL, CO2, GLUCOSE, BUN, CREATININE, CALCIUM in the last 72 hours.  Studies/Results: [place] Intraoperative  12/09/2014   CLINICAL DATA:    Ultrasound was provided for use by the ordering physician, and a technical  charge was applied by the performing facility.  No radiologist  interpretation/professional services rendered.     Assessment/Plan: Patient is doing well following redo thoracic laminectomy and resection of intramedullary spinal cord tumor.  To get postop MRI today.  Transfer to floor.  Mobilize with PT.    LOS: 1 day    Peggyann Shoals, MD 12/10/2014, 8:21 AM

## 2014-12-10 NOTE — Evaluation (Signed)
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  Pt currently limited functionally due to the problems listed. ( See problems list.)   Pt will benefit from PT to maximize function and safety in order to get ready for next venue listed below.     Follow Up Recommendations CIR    Equipment Recommendations  Rolling walker with 5" wheels;Other (comment) (TBA)    Recommendations for Other Services Rehab consult     Precautions / Restrictions Precautions Precautions: Fall Precaution Comments: laminectomy Restrictions Weight Bearing Restrictions: No      Mobility  Bed Mobility Overal bed mobility: Needs Assistance Bed Mobility: Sit to Sidelying;Sidelying to Sit;Rolling Rolling: Supervision Sidelying to sit: Supervision     Sit to sidelying: Supervision;HOB elevated (vc for sequencing) General bed mobility comments: ataxic LE's but safe  Transfers Overall transfer level: Needs assistance Equipment used: Rolling walker (2 wheeled) Transfers: Sit to/from Stand Sit to Stand: +2 physical assistance;Mod assist Stand pivot transfers: Mod assist;+2 physical assistance       General transfer comment: cues for hand placement   Ambulation/Gait Ambulation/Gait assistance: Mod assist;+2 physical assistance Ambulation Distance (Feet): 15 Feet (x2 to/from bathroom and 6' to/from the sink) Assistive device: Rolling walker (2 wheeled) Gait Pattern/deviations: Step-through pattern;Ataxic   Gait velocity interpretation: Below normal speed for age/gender General Gait Details: Very ataxic gait, R worse than L, worse when not looking  Stairs            Wheelchair Mobility    Modified Rankin (Stroke Patients Only)       Balance Overall balance assessment: Needs assistance Sitting-balance support: Feet supported;No upper extremity supported Sitting balance-Leahy Scale: Good     Standing balance support: No upper extremity supported Standing balance-Leahy Scale: Zero Standing balance comment: unable to stand unsupported                             Pertinent Vitals/Pain Pain Assessment: Faces Faces Pain Scale: Hurts little more Pain Location: back/head Pain Descriptors / Indicators: Aching Pain Intervention(s): Limited activity within patient's tolerance    Home Living Family/patient expects to be discharged to:: Private residence Living Arrangements: Spouse/significant other Available Help at Discharge: Available 24 hours/day Type of Home: House Home Access: Stairs to enter Entrance Stairs-Rails: Left Entrance Stairs-Number of Steps: 3 Home Layout: Multi-level Home Equipment: Clinical cytogeneticist - 2 wheels Additional Comments: wife's bathroom more accessible    Prior Function Level of Independence: Independent               Hand Dominance   Dominant Hand: Left    Extremity/Trunk Assessment   Upper Extremity Assessment: Defer to OT evaluation           Lower Extremity Assessment: RLE deficits/detail;LLE deficits/detail RLE Deficits / Details: strength WFL. poor proprioception. decreased sensation. ataxic Appears more impaired than L LLE Deficits / Details: strength WFL. poor sensation and proprioception. ataxic  Cervical / Trunk Assessment: Other exceptions (bacck surgery)  Communication      Cognition Arousal/Alertness: Awake/alert Behavior During Therapy: Impulsive Overall Cognitive Status: Within Functional Limits for tasks assessed                      General Comments General comments (skin  integrity, edema, etc.): There also appears to be a truncal component of ataxia    Exercises        Assessment/Plan    PT Assessment Patient  needs continued PT services  PT Diagnosis Difficulty walking;Abnormality of gait;Generalized weakness   PT Problem List Decreased strength;Decreased activity tolerance;Decreased balance;Decreased mobility;Decreased coordination;Decreased cognition;Decreased safety awareness;Pain  PT Treatment Interventions DME instruction;Gait training;Functional mobility training;Therapeutic activities;Balance training;Patient/family education;Neuromuscular re-education   PT Goals (Current goals can be found in the Care Plan section) Acute Rehab PT Goals Patient Stated Goal: to be able to walk so I can work PT Goal Formulation: With patient Time For Goal Achievement: 12/24/14 Potential to Achieve Goals: Good    Frequency Min 5X/week   Barriers to discharge        Co-evaluation PT/OT/SLP Co-Evaluation/Treatment: Yes Reason for Co-Treatment: For patient/therapist safety PT goals addressed during session: Mobility/safety with mobility;Balance         End of Session   Activity Tolerance: Patient tolerated treatment well;Patient limited by fatigue Patient left: in bed;with call bell/phone within reach Nurse Communication: Mobility status         Time: 1353-1435 PT Time Calculation (min) (ACUTE ONLY): 42 min   Charges:   PT Evaluation $Initial PT Evaluation Tier I: 1 Procedure PT Treatments $Gait Training: 8-22 mins   PT G Codes:        Lamarius Dirr, Tessie Fass 12/10/2014, 5:24 PM 12/10/2014  Donnella Sham, PT [phone]  (pager)

## 2014-12-11 ENCOUNTER — Inpatient Hospital Stay (HOSPITAL_COMMUNITY): Payer: Medicare Other

## 2014-12-11 DIAGNOSIS — G8220 Paraplegia, unspecified: Secondary | ICD-10-CM

## 2014-12-11 DIAGNOSIS — C720 Malignant neoplasm of spinal cord: Principal | ICD-10-CM

## 2014-12-11 LAB — GLUCOSE, CAPILLARY
Glucose-Capillary: 166 mg/dL — ABNORMAL HIGH (ref 65–99)
Glucose-Capillary: 168 mg/dL — ABNORMAL HIGH (ref 65–99)
Glucose-Capillary: 202 mg/dL — ABNORMAL HIGH (ref 65–99)
Glucose-Capillary: 203 mg/dL — ABNORMAL HIGH (ref 65–99)
Glucose-Capillary: 217 mg/dL — ABNORMAL HIGH (ref 65–99)
Glucose-Capillary: 230 mg/dL — ABNORMAL HIGH (ref 65–99)

## 2014-12-11 MED ORDER — GADOBENATE DIMEGLUMINE 529 MG/ML IV SOLN
20.0000 mL | Freq: Once | INTRAVENOUS | Status: AC
  Administered 2014-12-11: 20 mL via INTRAVENOUS

## 2014-12-11 NOTE — Progress Notes (Addendum)
Noted red bloody urine of 368ml. No C/O of pain when urinate. Abdomen soft and nontender. MD made aware with no further order but continue to monitor.

## 2014-12-11 NOTE — Progress Notes (Signed)
Patient has CPAP in room with home mask. Patient stated he can place himself on and off and will call if he needs help. Water chamber filled. Settings set at 8 cmH2O.

## 2014-12-11 NOTE — Progress Notes (Signed)
Subjective: Patient reports "I haven't walked. They helped me up at the side of the bed"  Objective: Vital signs in last 24 hours: Temp:  [97.7 F (36.5 C)-98.7 F (37.1 C)] 97.7 F (36.5 C) (09/08 0547) Pulse Rate:  [63-79] 63 (09/08 0547) Resp:  [16-24] 18 (09/08 0547) BP: (128-160)/(61-69) 150/66 mmHg (09/08 0547) SpO2:  [90 %-94 %] 93 % (09/08 0547)  Intake/Output from previous day: 09/07 0701 - 09/08 0700 In: 150 [I.V.:150] Out: 720 [Urine:720] Intake/Output this shift:    Alert, conversant, without c/o pain. Good strength BLE. Pt reports numbness both feet and acknowledges strength in legs, but feels poor muscle control prevents ambulation. Report of bloody urine this morning with voiding. No difficulty or burning with urination.  Incision flat beneath honeycomb drsg. No erythema or drainage. Headache when HOB high - not unexpected - reassured. Post-op MRI not yet obtained for reason unknown at present.   Lab Results: No results for input(s): WBC, HGB, HCT, PLT in the last 72 hours. BMET No results for input(s): NA, K, CL, CO2, GLUCOSE, BUN, CREATININE, CALCIUM in the last 72 hours.  Studies/Results: [place] Intraoperative  12/09/2014   CLINICAL DATA:    Ultrasound was provided for use by the ordering physician, and a technical  charge was applied by the performing facility.  No radiologist  interpretation/professional services rendered.     Assessment/Plan:   LOS: 2 days  Reassured re: strength BLE and plan for CIR; reassured [id number] and likelyhood of urethral irritation by Foley - will monitor.  MRI order remains active with po Ativan prior to study. Nursing will call MRI to check status - should be performed early today.    Verdis Prime 12/11/2014, 7:45 AM

## 2014-12-11 NOTE — Consult Note (Signed)
Physical Medicine and Rehabilitation Consult Reason for Consult: Thoracic laminectomy and resection of intramedullary spinal cord tumor Referring Physician: Dr. Vertell Limber   HPI: [name] is a 79 y.o. 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 Patient lives with his wife had been independent until recently. Presented 12/09/2014 with gait abnormality, numbness in his perineum region. Recent MRI imaging thoracic spine showed progression of abnormal signal and enhancement extending T10-T11 ependymoma increased from comparison 03/07/2014. Underwent redo thoracic laminectomy with intramedullary tumor resection 12/09/2014 for Dr. Vertell Limber. Hospital course pain management. Decadron protocol as indicated. Physical therapy evaluation completed 12/10/2014 with recommendations of physical medicine rehabilitation consult.   Review of Systems  Constitutional: Negative for fever and chills.  HENT: Negative for hearing loss.   Eyes: Negative for blurred vision and double vision.  Respiratory: Negative for cough and shortness of breath.   Cardiovascular: Positive for palpitations and leg swelling. Negative for chest pain.  Gastrointestinal: Positive for constipation. Negative for nausea and abdominal pain.  Genitourinary: Positive for frequency. Negative for hematuria.  Musculoskeletal: Positive for back pain.  Skin: Negative for rash.  Neurological: Negative for headaches.  Psychiatric/Behavioral: Positive for depression.   Past Medical History  Diagnosis Date  . History of prostate cancer 2004--  S/P SEED IMPLANTS     NO RECURRENCE  . S/P primary angioplasty with coronary stent 2006--      X2 DE STENTS - Cypher 2.5 mm postdilated to 2.75 mm (20 mm and 13 mm stents)  . Urgency of urination   . Frequency of urination   . H/O: gout STABLE  . Impaired hearing RIGHT HEARING AID  .  Hydrocele, left   . History of melanoma excision 2010    FOREHEAD  . CAD S/P percutaneous coronary angioplasty CARDIOLOGIST-  DR DAVID [facility]    Myoview March 2014: No ischemia or infarction  . Diastolic dysfunction without heart failure     Moderate LVH, Gr 1 DD on Echo 2011; no CHF admissions, no tt on diuretisc  . Hyperlipidemia LDL goal <70   . Essential hypertension   . Colon polyps 05/10/2005    Hyperplastic polyps  . Depression   . OSA on CPAP     bipap  . Heart murmur     hx - no notable valvular lesion on Echo  . Cancer     prostate , melanoma head  . Arthritis   . Shingles 07  . Gout   . Complication of anesthesia     "takes long time to wake up"  . Diabetes mellitus type 2 with complications     neuropathy; CAD borderline- no med   Past Surgical History  Procedure Laterality Date  . Prostate palladium gold seed implants (118)  11-15-2002    PROSTATE CANCER  . Excision melanoma from forehead  2010  . Coronary angioplasty with stent placement  09-01-2004      Cypher DES x 2 d-m LCx 2.5 mm x 20 mm & 2.5 mm x 13 mm (~2.75 mm)  . Transthoracic echocardiogram  MAR [id number]    NORMAL EF > 55% / NORMAL FUNCTION WITH IMPAIRED RELAXATION AND MODERATE CONCENTRIC LVH LIKELY HYPERTENSIVE DISEASE  . Hydrocele excision  10/14/2011    Procedure: HYDROCELECTOMY ADULT;  Surgeon: Ailene Rud, MD;  Location: [facility];  Service: Urology;  Laterality: Left;  . Circumsision    .  Laminectomy N/A 07/13/2012    Procedure: THORACIC LAMINECTOMY FOR RESECTION OF INTRAMEDULLARY SPINAL CHORD TUMOR WITH SPINAL CHORD MONITORING;  Surgeon: Erline Levine, MD;  Location: [facility];  Service: Neurosurgery;  Laterality: N/A;  Thoracic laminectomy for resection of intramedullary spinal cord tumor with spinal cord monitering and Dr. Christella Noa to assist  . Nm myoview ltd  [id number]    Lexiscan: LOW RISK, mild inferior bowel artifact; No ischemia or Infarction  . Eye surgery  Bilateral     cataracts  . Tonsillectomy    . Laminectomy N/A 12/09/2014    Procedure: Redo Thoracic laminectomy with intramedullary tumar resection/USN/[place]/Subarachnoid shunt/spinal cord monitoring/Dr. Kathyrn Sheriff to assist;  Surgeon: Erline Levine, MD;  Location: [facility];  Service: Neurosurgery;  Laterality: N/A;  Redo Thoracic laminectomy with intramedullary tumar resection/USN/[place]/Subarachnoid shunt/spinal cord monitoring/Dr. Kathyrn Sheriff to assist   Family History  Problem Relation Age of Onset  . Hypertension Mother   . Stroke Mother   . Hypertension Father   . Prostate cancer Father   . Hypertension Brother   . Prostate cancer Brother    Social History:  reports that he has never smoked. He has never used smokeless tobacco. He reports that he drinks alcohol. He reports that he does not use illicit drugs. Allergies:  Allergies  Allergen Reactions  . Betadine [Povidone Iodine] Anaphylaxis  . Contrast Media [Iodinated Diagnostic Agents] Anaphylaxis and Rash    Other Reaction: Other reaction  . Iodine Anaphylaxis    Shellfish, IVP dye. Swelling in throat and foaming at mouth.  . Shellfish Allergy Anaphylaxis   Medications Prior to Admission  Medication Sig Dispense Refill  . allopurinol (ZYLOPRIM) 300 MG tablet Take 300 mg by mouth daily.     Marland Kitchen amLODipine (NORVASC) 10 MG tablet Take 10 mg by mouth daily.     Marland Kitchen aspirin EC 81 MG tablet Take 81 mg by mouth daily.    . clopidogrel (PLAVIX) 75 MG tablet Take 1 tablet by mouth daily.    Marland Kitchen escitalopram (LEXAPRO) 10 MG tablet Take 10 mg by mouth daily.     Marland Kitchen oxybutynin (DITROPAN XL) 15 MG 24 hr tablet Take 15 mg by mouth at bedtime.    . rosuvastatin (CRESTOR) 20 MG tablet Take 20 mg by mouth daily.    . tamsulosin (FLOMAX) 0.4 MG CAPS Take 0.4 mg by mouth at bedtime.    . valsartan (DIOVAN) 80 MG tablet Take 80 mg by mouth daily.     Marland Kitchen VITAMIN D, CHOLECALCIFEROL, PO Take 1 tablet by mouth daily.       Home: Home  Living Family/patient expects to be discharged to:: Private residence Living Arrangements: Spouse/significant other Available Help at Discharge: Available 24 hours/day Type of Home: House Home Access: Stairs to enter Technical brewer of Steps: 3 Entrance Stairs-Rails: Left Home Layout: Multi-level Alternate Level Stairs-Number of Steps: 8 Alternate Level Stairs-Rails: Right Bathroom Shower/Tub: Chiropodist: Handicapped height Bathroom Accessibility: Yes Home Equipment: Civil engineer, contracting, Environmental consultant - 2 wheels Additional Comments: wife's bathroom more accessible  Functional History: Prior Function Level of Independence: Independent Functional Status:  Mobility: Bed Mobility Overal bed mobility: Needs Assistance Bed Mobility: Sit to Sidelying, Sidelying to Sit, Rolling Rolling: Supervision Sidelying to sit: Supervision Sit to sidelying: Supervision, HOB elevated (vc for sequencing) General bed mobility comments: ataxic LE's but safe Transfers Overall transfer level: Needs assistance Equipment used: Rolling walker (2 wheeled) Transfers: Sit to/from Stand Sit to Stand: +2 physical assistance, Mod assist Stand pivot transfers: Mod  assist, +2 physical assistance General transfer comment: cues for hand placement  Ambulation/Gait Ambulation/Gait assistance: Mod assist, +2 physical assistance Ambulation Distance (Feet): 15 Feet (x2 to/from bathroom and 6' to/from the sink) Assistive device: Rolling walker (2 wheeled) Gait Pattern/deviations: Step-through pattern, Ataxic General Gait Details: Very ataxic gait, R worse than L, worse when not looking Gait velocity interpretation: Below normal speed for age/gender    ADL: ADL Overall ADL's : Needs assistance/impaired Grooming: Set up, Sitting Upper Body Bathing: Set up, Sitting Lower Body Bathing: Moderate assistance, Sit to/from stand Upper Body Dressing : Set up, Sitting Lower Body Dressing: Moderate  assistance, Sit to/from stand Toilet Transfer: +2 for physical assistance, Maximal assistance, RW, Ambulation Toileting- Clothing Manipulation and Hygiene: Moderate assistance, Sit to/from stand Functional mobility during ADLs: +2 for physical assistance, Maximal assistance, Rolling walker, Cueing for safety, Cueing for sequencing (atqaxic gait pattern) General ADL Comments: unsafe and high risk for falls during mobility for ADL. Impulsive at baseline.  Cognition: Cognition Overall Cognitive Status: Within Functional Limits for tasks assessed Orientation Level: Oriented X4 Cognition Arousal/Alertness: Awake/alert Behavior During Therapy: Impulsive Overall Cognitive Status: Within Functional Limits for tasks assessed  Blood pressure 150/66, pulse 63, temperature 97.7 F (36.5 C), temperature source Oral, resp. rate 18, weight 99.338 kg (219 lb), SpO2 93 %. Physical Exam  Constitutional: He is oriented to person, place, and time. He appears well-developed.  HENT:  Head: Normocephalic.  Eyes: EOM are normal.  Neck: Normal range of motion. Neck supple. No thyromegaly present.  Cardiovascular: Normal rate and regular rhythm.   Respiratory: Effort normal and breath sounds normal. No respiratory distress.  GI: Soft. Bowel sounds are normal. He exhibits no distension.  Neurological: He is alert and oriented to person, place, and time.  Decreased LT/pain in bilateral lower extremities from upper thigh and distally. Decreased gross coordination of both legs, right more than left. RLE: 4/5 hf, 3+ke and adf/apf. LLE: 4/5 hf, 4ke and 4-adf/apf.  Good sitting balance. ue motor and sensory are normal. Cognitively he's intact  Skin:  Incision site is dressed clean and dry  Psychiatric: He has a normal mood and affect. His behavior is normal.    Results for orders placed or performed during the hospital encounter of 12/09/14 (from the past 24 hour(s))  Glucose, capillary     Status: Abnormal    Collection Time: 12/10/14  8:42 AM  Result Value Ref Range   Glucose-Capillary 158 (H) 65 - 99 mg/dL  Glucose, capillary     Status: Abnormal   Collection Time: 12/10/14 12:24 PM  Result Value Ref Range   Glucose-Capillary 194 (H) 65 - 99 mg/dL   Comment 1 Notify RN    Comment 2 Document in Chart   Glucose, capillary     Status: Abnormal   Collection Time: 12/10/14  4:51 PM  Result Value Ref Range   Glucose-Capillary 173 (H) 65 - 99 mg/dL   Comment 1 Notify RN    Comment 2 Document in Chart   Glucose, capillary     Status: Abnormal   Collection Time: 12/10/14  9:27 PM  Result Value Ref Range   Glucose-Capillary 195 (H) 65 - 99 mg/dL  Glucose, capillary     Status: Abnormal   Collection Time: 12/10/14 11:55 PM  Result Value Ref Range   Glucose-Capillary 157 (H) 65 - 99 mg/dL   Comment 1 Notify RN    Comment 2 Document in Chart   Glucose, capillary     Status: Abnormal  Collection Time: 12/11/14  4:01 AM  Result Value Ref Range   Glucose-Capillary 168 (H) 65 - 99 mg/dL   Comment 1 Notify RN    Comment 2 Document in Chart    [place] Intraoperative  12/09/2014   CLINICAL DATA:    Ultrasound was provided for use by the ordering physician, and a technical  charge was applied by the performing facility.  No radiologist  interpretation/professional services rendered.     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 Does the need for close, 24 hr/day medical supervision in concert with the patient's rehab needs make it unreasonable for this patient to be served in a less intensive setting? Yes 2. Co-Morbidities requiring supervision/potential complications: CAD, hx prostate cancer, htn 3. Due to bladder management, bowel management, safety, skin/wound care, disease management, medication administration, pain management and patient education, does the patient require 24 hr/day rehab nursing? Yes 4. Does the patient require  coordinated care of a physician, rehab nurse, PT (1-2 hrs/day, 5 days/week) and OT (1-2 hrs/day, 5 days/week) to address physical and functional deficits in the context of the above medical diagnosis(es)? Yes Addressing deficits in the following areas: balance, endurance, locomotion, strength, transferring, bowel/bladder control, bathing, dressing, feeding, grooming and toileting 5. Can the patient actively participate in an intensive therapy program of at least 3 hrs of therapy per day at least 5 days per week? Yes 6. The potential for patient to make measurable gains while on inpatient rehab is excellent 7. Anticipated functional outcomes upon discharge from inpatient rehab are modified independent  with PT, modified independent with OT, n/a with SLP. 8. Estimated rehab length of stay to reach the above functional goals is: 10-14 days 9. Does the patient have adequate social supports and living environment to accommodate these discharge functional goals? Yes 10. Anticipated D/C setting: Home 11. Anticipated post D/C treatments: HH therapy and Outpatient therapy 12. Overall Rehab/Functional Prognosis: excellent  RECOMMENDATIONS: This patient's condition is appropriate for continued rehabilitative care in the following setting: CIR Patient has agreed to participate in recommended program. Yes Note that insurance prior authorization may be required for reimbursement for recommended care.  Comment: Rehab Admissions Coordinator to follow up.  Thanks,  Meredith Staggers, MD, Mellody Drown     12/11/2014

## 2014-12-11 NOTE — H&P (Signed)
Physical Medicine and Rehabilitation Admission H&P    Chief complaint: Weakness  HPI: [name] is a 79 y.o. 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 Patient lives with his wife had been independent until recently. Presented 12/09/2014 with gait abnormality, numbness in his perineum region. Recent MRI imaging thoracic spine showed progression of abnormal signal and enhancement extending T10-T11 ependymoma increased from comparison 03/07/2014. Underwent redo thoracic laminectomy with intramedullary tumor resection 12/09/2014 for Dr. Vertell Limber. Hospital course pain management. Decadron protocol as indicated. Pathology report pending. Bout of hematuria felt to be related to Foley tube irritation and monitor. Physical and occupational therapy evaluations completed 12/10/2014 with recommendations of physical medicine rehabilitation consult. Patient was admitted for a comprehensive rehabilitation program  ROS Review of Systems  Constitutional: Negative for fever and chills.  HENT: Negative for hearing loss.  Eyes: Negative for blurred vision and double vision.  Respiratory: Negative for cough and shortness of breath.  Cardiovascular: Positive for palpitations and leg swelling. Negative for chest pain.  Gastrointestinal: Positive for constipation. Negative for nausea and abdominal pain.  Genitourinary: Positive for frequency. Negative for hematuria.  Musculoskeletal: Positive for back pain.  Skin: Negative for rash.  Neurological: Negative for headaches.  Psychiatric/Behavioral: Positive for depression Past Medical History  Diagnosis Date  . History of prostate cancer 2004--  S/P SEED IMPLANTS     NO RECURRENCE  . S/P primary angioplasty with coronary stent 2006--      X2 DE STENTS - Cypher 2.5 mm postdilated to 2.75 mm (20 mm  and 13 mm stents)  . Urgency of urination   . Frequency of urination   . H/O: gout STABLE  . Impaired hearing RIGHT HEARING AID  . Hydrocele, left   . History of melanoma excision 2010    FOREHEAD  . CAD S/P percutaneous coronary angioplasty CARDIOLOGIST-  DR DAVID [facility]    Myoview March 2014: No ischemia or infarction  . Diastolic dysfunction without heart failure     Moderate LVH, Gr 1 DD on Echo 2011; no CHF admissions, no tt on diuretisc  . Hyperlipidemia LDL goal <70   . Essential hypertension   . Colon polyps 05/10/2005    Hyperplastic polyps  . Depression   . OSA on CPAP     bipap  . Heart murmur     hx - no notable valvular lesion on Echo  . Cancer     prostate , melanoma head  . Arthritis   . Shingles 07  . Gout   . Complication of anesthesia     "takes long time to wake up"  . Diabetes mellitus type 2 with complications     neuropathy; CAD borderline- no med   Past Surgical History  Procedure Laterality Date  . Prostate palladium gold seed implants (118)  11-15-2002    PROSTATE CANCER  . Excision melanoma from forehead  2010  . Coronary angioplasty with stent placement  09-01-2004      Cypher DES x 2 d-m LCx 2.5 mm x 20 mm & 2.5 mm x 13 mm (~2.75 mm)  . Transthoracic echocardiogram  MAR [id number]    NORMAL EF > 55% / NORMAL FUNCTION WITH IMPAIRED RELAXATION AND MODERATE CONCENTRIC LVH LIKELY HYPERTENSIVE DISEASE  . Hydrocele excision  10/14/2011    Procedure: HYDROCELECTOMY ADULT;  Surgeon: Ailene Rud,  MD;  Location: [facility];  Service: Urology;  Laterality: Left;  . Circumsision    . Laminectomy N/A 07/13/2012    Procedure: THORACIC LAMINECTOMY FOR RESECTION OF INTRAMEDULLARY SPINAL CHORD TUMOR WITH SPINAL CHORD MONITORING;  Surgeon: Erline Levine, MD;  Location: [facility];  Service: Neurosurgery;  Laterality: N/A;  Thoracic laminectomy for resection of intramedullary spinal cord tumor with spinal cord monitering and Dr. Christella Noa to  assist  . Nm myoview ltd  [id number]    Lexiscan: LOW RISK, mild inferior bowel artifact; No ischemia or Infarction  . Eye surgery Bilateral     cataracts  . Tonsillectomy    . Laminectomy N/A 12/09/2014    Procedure: Redo Thoracic laminectomy with intramedullary tumar resection/USN/[place]/Subarachnoid shunt/spinal cord monitoring/Dr. Kathyrn Sheriff to assist;  Surgeon: Erline Levine, MD;  Location: [facility];  Service: Neurosurgery;  Laterality: N/A;  Redo Thoracic laminectomy with intramedullary tumar resection/USN/[place]/Subarachnoid shunt/spinal cord monitoring/Dr. Kathyrn Sheriff to assist   Family History  Problem Relation Age of Onset  . Hypertension Mother   . Stroke Mother   . Hypertension Father   . Prostate cancer Father   . Hypertension Brother   . Prostate cancer Brother    Social History:  reports that he has never smoked. He has never used smokeless tobacco. He reports that he drinks alcohol. He reports that he does not use illicit drugs. Allergies:  Allergies  Allergen Reactions  . Betadine [Povidone Iodine] Anaphylaxis  . Contrast Media [Iodinated Diagnostic Agents] Anaphylaxis and Rash    Other Reaction: Other reaction  . Iodine Anaphylaxis    Shellfish, IVP dye. Swelling in throat and foaming at mouth.  . Shellfish Allergy Anaphylaxis   Medications Prior to Admission  Medication Sig Dispense Refill  . allopurinol (ZYLOPRIM) 300 MG tablet Take 300 mg by mouth daily.     Marland Kitchen amLODipine (NORVASC) 10 MG tablet Take 10 mg by mouth daily.     Marland Kitchen aspirin EC 81 MG tablet Take 81 mg by mouth daily.    . clopidogrel (PLAVIX) 75 MG tablet Take 1 tablet by mouth daily.    Marland Kitchen escitalopram (LEXAPRO) 10 MG tablet Take 10 mg by mouth daily.     Marland Kitchen oxybutynin (DITROPAN XL) 15 MG 24 hr tablet Take 15 mg by mouth at bedtime.    . rosuvastatin (CRESTOR) 20 MG tablet Take 20 mg by mouth daily.    . tamsulosin (FLOMAX) 0.4 MG CAPS Take 0.4 mg by mouth at bedtime.    . valsartan (DIOVAN) 80 MG tablet  Take 80 mg by mouth daily.     Marland Kitchen VITAMIN D, CHOLECALCIFEROL, PO Take 1 tablet by mouth daily.       Home: Home Living Family/patient expects to be discharged to:: Private residence Living Arrangements: Spouse/significant other Available Help at Discharge: Available 24 hours/day Type of Home: House Home Access: Stairs to enter Technical brewer of Steps: 3 Entrance Stairs-Rails: Left Home Layout: Multi-level Alternate Level Stairs-Number of Steps: 8 Alternate Level Stairs-Rails: Right Bathroom Shower/Tub: Chiropodist: Handicapped height Bathroom Accessibility: Yes Home Equipment: Civil engineer, contracting, Environmental consultant - 2 wheels Additional Comments: wife's bathroom more accessible   Functional History: Prior Function Level of Independence: Independent  Functional Status:  Mobility: Bed Mobility Overal bed mobility: Needs Assistance Bed Mobility: Sit to Sidelying, Sidelying to Sit, Rolling Rolling: Supervision Sidelying to sit: Supervision Sit to sidelying: Supervision, HOB elevated (vc for sequencing) General bed mobility comments: ataxic LE's but safe Transfers Overall transfer level: Needs assistance  Equipment used: Rolling walker (2 wheeled) Transfers: Sit to/from Stand Sit to Stand: +2 physical assistance, Mod assist Stand pivot transfers: Mod assist, +2 physical assistance General transfer comment: cues for hand placement  Ambulation/Gait Ambulation/Gait assistance: Mod assist, +2 physical assistance Ambulation Distance (Feet): 15 Feet (x2 to/from bathroom and 6' to/from the sink) Assistive device: Rolling walker (2 wheeled) Gait Pattern/deviations: Step-through pattern, Ataxic General Gait Details: Very ataxic gait, R worse than L, worse when not looking Gait velocity interpretation: Below normal speed for age/gender    ADL: ADL Overall ADL's : Needs assistance/impaired Grooming: Set up, Sitting Upper Body Bathing: Set up, Sitting Lower Body Bathing:  Moderate assistance, Sit to/from stand Upper Body Dressing : Set up, Sitting Lower Body Dressing: Moderate assistance, Sit to/from stand Toilet Transfer: +2 for physical assistance, Maximal assistance, RW, Ambulation Toileting- Clothing Manipulation and Hygiene: Moderate assistance, Sit to/from stand Functional mobility during ADLs: +2 for physical assistance, Maximal assistance, Rolling walker, Cueing for safety, Cueing for sequencing (atqaxic gait pattern) General ADL Comments: unsafe and high risk for falls during mobility for ADL. Impulsive at baseline.  Cognition: Cognition Overall Cognitive Status: Within Functional Limits for tasks assessed Orientation Level: Oriented X4 Cognition Arousal/Alertness: Awake/alert Behavior During Therapy: Impulsive Overall Cognitive Status: Within Functional Limits for tasks assessed  Physical Exam: Blood pressure 150/66, pulse 63, temperature 97.7 F (36.5 C), temperature source Oral, resp. rate 18, weight 99.338 kg (219 lb), SpO2 93 %. Physical Exam Constitutional: He is oriented to person, place, and time. He appears well-developed.  HENT: oral mucosa pink and moist Head: Normocephalic.  Eyes: EOM are normal.  Neck: Normal range of motion. Neck supple. No thyromegaly present.  Cardiovascular: Normal rate and regular rhythm. occasional pac Respiratory: Effort normal and breath sounds normal. No respiratory distress. No wheezes or rales GI: Soft. Bowel sounds are normal. He exhibits no distension.  Neurological: He is alert and oriented to person, place, and time.  Decreased LT/PP in bilateral lower extremities from upper thigh and distally. Decreased gross coordination of both legs, right more than left. RLE, mild limb ataxia: 4/5 hf, 3+ke and adf/apf. LLE: 4/5 hf, 4ke and 4-adf/apf. Bilateral UE:  5/5 deltoid, bicep, tricpe, wrist, HI. Marland Kitchen Cognitively he's appropriate with normal insight and awareness Skin:  Incision site is dressed clean and  dry  Psychiatric: He has a normal mood and affect. His behavior is normal    Results for orders placed or performed during the hospital encounter of 12/09/14 (from the past 48 hour(s))  Glucose, capillary     Status: Abnormal   Collection Time: 12/09/14 12:13 PM  Result Value Ref Range   Glucose-Capillary 169 (H) 65 - 99 mg/dL   Comment 1 Notify RN   Glucose, capillary     Status: Abnormal   Collection Time: 12/09/14  4:46 PM  Result Value Ref Range   Glucose-Capillary 212 (H) 65 - 99 mg/dL  Urinalysis, Routine w reflex microscopic (not at [facility])     Status: Abnormal   Collection Time: 12/09/14  6:00 PM  Result Value Ref Range   Color, Urine YELLOW YELLOW   APPearance CLEAR CLEAR   Specific Gravity, Urine 1.018 1.005 - 1.030   pH 5.0 5.0 - 8.0   Glucose, UA 250 (A) NEGATIVE mg/dL   Hgb urine dipstick MODERATE (A) NEGATIVE   Bilirubin Urine NEGATIVE NEGATIVE   Ketones, ur NEGATIVE NEGATIVE mg/dL   Protein, ur NEGATIVE NEGATIVE mg/dL   Urobilinogen, UA 0.2 0.0 - 1.0 mg/dL  Nitrite NEGATIVE NEGATIVE   Leukocytes, UA NEGATIVE NEGATIVE  Urine microscopic-add on     Status: Abnormal   Collection Time: 12/09/14  6:00 PM  Result Value Ref Range   Squamous Epithelial / LPF RARE RARE   WBC, UA 0-2 <3 WBC/hpf   RBC / HPF 0-2 <3 RBC/hpf   Crystals URIC ACID CRYSTALS (A) NEGATIVE   Urine-Other MUCOUS PRESENT   Glucose, capillary     Status: Abnormal   Collection Time: 12/09/14  8:40 PM  Result Value Ref Range   Glucose-Capillary 154 (H) 65 - 99 mg/dL  Glucose, capillary     Status: Abnormal   Collection Time: 12/10/14 12:25 AM  Result Value Ref Range   Glucose-Capillary 162 (H) 65 - 99 mg/dL  Glucose, capillary     Status: Abnormal   Collection Time: 12/10/14  5:03 AM  Result Value Ref Range   Glucose-Capillary 175 (H) 65 - 99 mg/dL  Glucose, capillary     Status: Abnormal   Collection Time: 12/10/14  8:42 AM  Result Value Ref Range   Glucose-Capillary 158 (H) 65 - 99 mg/dL    Glucose, capillary     Status: Abnormal   Collection Time: 12/10/14 12:24 PM  Result Value Ref Range   Glucose-Capillary 194 (H) 65 - 99 mg/dL   Comment 1 Notify RN    Comment 2 Document in Chart   Glucose, capillary     Status: Abnormal   Collection Time: 12/10/14  4:51 PM  Result Value Ref Range   Glucose-Capillary 173 (H) 65 - 99 mg/dL   Comment 1 Notify RN    Comment 2 Document in Chart   Glucose, capillary     Status: Abnormal   Collection Time: 12/10/14  9:27 PM  Result Value Ref Range   Glucose-Capillary 195 (H) 65 - 99 mg/dL  Glucose, capillary     Status: Abnormal   Collection Time: 12/10/14 11:55 PM  Result Value Ref Range   Glucose-Capillary 157 (H) 65 - 99 mg/dL   Comment 1 Notify RN    Comment 2 Document in Chart   Glucose, capillary     Status: Abnormal   Collection Time: 12/11/14  4:01 AM  Result Value Ref Range   Glucose-Capillary 168 (H) 65 - 99 mg/dL   Comment 1 Notify RN    Comment 2 Document in Chart   Glucose, capillary     Status: Abnormal   Collection Time: 12/11/14  7:56 AM  Result Value Ref Range   Glucose-Capillary 217 (H) 65 - 99 mg/dL   [place] Intraoperative  12/09/2014   CLINICAL DATA:    Ultrasound was provided for use by the ordering physician, and a technical  charge was applied by the performing facility.  No radiologist  interpretation/professional services rendered.        Medical Problem List and Plan: 1. 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  -begin CIR therapies  -steroid taper 2.  DVT Prophylaxis/Anticoagulation: SCDs. Check vascular study upon admission given VTE risk. 3. Pain Management: Hydrocodone and Robaxin as needed. Monitor with increased mobility 4. Mood: Lexapro 10 mg daily. Provide emotional support 5. Neuropsych: This patient is capable of making decisions on his own behalf. 6. Skin/Wound Care: Routine skin checks, encourage appropriate  nutrition.  -keep area dry 7. Fluids/Electrolytes/Nutrition: Routine I&O with follow-up chemistries with admit labs 8. Hypertension. Norvasc 10 mg daily, Avapro 75 mg daily. Monitor with increased mobility 9. Constipation/neurogenic bowel: Adjust  bowel program. Has been constipated 10. Hyperlipidemia. Crestor 11. History of gout. Allopurinol 300 mg daily. Monitor for any flareups 12. BPH with history of prostate cancer/hematuria. Flomax 0.4 mg daily, Ditropan 15 mg daily at bedtime. Check PVRs 3. Follow for voiding patterns 13. CAD with stenting. Resume aspirin and Plavix  -therapy to monitor for symptoms with increased exertion    Post Admission Physician Evaluation: 1. Functional deficits secondary  to thoracic ependymoma with myelopathy and paraplegia 2. Patient is admitted to receive collaborative, interdisciplinary care between the physiatrist, rehab nursing staff, and therapy team. 3. Patient's level of medical complexity and substantial therapy needs in context of that medical necessity cannot be provided at a lesser intensity of care such as a SNF. 4. Patient has experienced substantial functional loss from his/her baseline which was documented above under the "Functional History" and "Functional Status" headings.  Judging by the patient's diagnosis, physical exam, and functional history, the patient has potential for functional progress which will result in measurable gains while on inpatient rehab.  These gains will be of substantial and practical use upon discharge  in facilitating mobility and self-care at the household level. 5. Physiatrist will provide 24 hour management of medical needs as well as oversight of the therapy plan/treatment and provide guidance as appropriate regarding the interaction of the two. 6. 24 hour rehab nursing will assist with bladder management, bowel management, safety, skin/wound care, disease management, medication administration, pain management and  patient education  and help integrate therapy concepts, techniques,education, etc. 7. PT will assess and treat for/with: Lower extremity strength, range of motion, stamina, balance, functional mobility, safety, adaptive techniques and equipment, NMR, surgical precautions, pain mgt, family education, patient education.   Goals are: mod I. 8. OT will assess and treat for/with: ADL's, functional mobility, safety, upper extremity strength, adaptive techniques and equipment, NMR, surgical precautions, pain mgt, patient education.   Goals are: mod I. Therapy may proceed with showering this patient. 9. SLP will assess and treat for/with: n/a.  Goals are: n/a. 10. Case Management and Social Worker will assess and treat for psychological issues and discharge planning. 11. Team conference will be held weekly to assess progress toward goals and to determine barriers to discharge. 12. Patient will receive at least 3 hours of therapy per day at least 5 days per week. 13. ELOS: 10-12 days       14. Prognosis:  excellent     Meredith Staggers, MD, [facility] 12/12/2014   12/11/2014

## 2014-12-11 NOTE — Progress Notes (Signed)
Physical Therapy Treatment Patient Details Name: [name] MRN: [id number] DOB: 01/16/1936 Today's Date: 12/11/2014    History of Present Illness Patient is a 79 y/o male with history of s/p thoracic laminectomy and resection of intramedullary spinal cord tumor    PT Comments    Patient progressing with ambulation distance this session, though still a high fall risk, able to compensate for sensory deficits with cues, visual feed back and repetition with about 50% follow through.  Patient severely internally and externally distracted which limits safety and focus.  Follow Up Recommendations  CIR     Equipment Recommendations  Rolling walker with 5" wheels    Recommendations for Other Services       Precautions / Restrictions Precautions Precautions: Fall Precaution Comments: laminectomy    Mobility  Bed Mobility     Rolling: Supervision Sidelying to sit: Supervision     Sit to sidelying: Supervision General bed mobility comments: use of bed rail and appropriate log roll technique  Transfers Overall transfer level: Needs assistance Equipment used: Rolling walker (2 wheeled) Transfers: Sit to/from Stand   Stand pivot transfers: Mod assist;+2 physical assistance       General transfer comment: assist for safety due to impulsive and poor proprioception of right LE, lateral LOB to right  Ambulation/Gait Ambulation/Gait assistance: Mod assist;+2 physical assistance Ambulation Distance (Feet): 90 Feet Assistive device: Rolling walker (2 wheeled) Gait Pattern/deviations: Step-through pattern;Decreased stride length;Drifts right/left;Ataxic     General Gait Details: cues for keeping right foot to right, shorter step length, proximity to walker, turning walker independently of feet and for postural resest every few feet due to having to look at feet   Stairs            Wheelchair Mobility    Modified Rankin (Stroke Patients Only)       Balance      Sitting balance-Leahy Scale: Good     Standing balance support: Bilateral upper extremity supported;No upper extremity supported Standing balance-Leahy Scale: Poor Standing balance comment: static balance with min support and not walker; walker for ambulation and +2 for safety due to poor position awareness of right LE and decreased safety awareness                    Cognition Arousal/Alertness: Awake/alert Behavior During Therapy: Impulsive Overall Cognitive Status: Within Functional Limits for tasks assessed                      Exercises Other Exercises Other Exercises: sit to stand x 5 cues for foot position, hand placement, controlled movements    General Comments General comments (skin integrity, edema, etc.): wife present and encourages patient to focus      Pertinent Vitals/Pain Pain Assessment: 0-10 Pain Score: 5  Pain Location: headache Pain Descriptors / Indicators: Aching Pain Intervention(s): Limited activity within patient's tolerance;Repositioned    Home Living                      Prior Function            PT Goals (current goals can now be found in the care plan section) Acute Rehab PT Goals Patient Stated Goal: to be able to walk so I can work Progress towards PT goals: Progressing toward goals    Frequency  Min 5X/week    PT Plan Current plan remains appropriate    Co-evaluation  End of Session Equipment Utilized During Treatment: Gait belt Activity Tolerance: Patient tolerated treatment well Patient left: in chair;with chair alarm set;with call bell/phone within reach;with family/visitor present     Time: 1332-1406 PT Time Calculation (min) (ACUTE ONLY): 34 min  Charges:  $Gait Training: 8-22 mins $Therapeutic Activity: 8-22 mins                    G Codes:      [place] Morren,CYNDI 01/09/15, 3:14 PM  ,[place], [place] 01-09-2015

## 2014-12-11 NOTE — Progress Notes (Signed)
Patient voided 300 mL clear, yellow, non odorous urine. Post void residual scan showed 36 mL left in bladder.

## 2014-12-11 NOTE — Progress Notes (Signed)
Rehab admissions - Evaluated for possible admission.  I will be assisting with possible inpatient rehab admission.  I will see patient in am.  We do have beds available tomorrow.  Call me for questions.  [id number]

## 2014-12-12 ENCOUNTER — Inpatient Hospital Stay (HOSPITAL_COMMUNITY)
Admission: RE | Admit: 2014-12-12 | Discharge: 2014-12-25 | DRG: 055 | Disposition: A | Discharge status: Home Health | Payer: Medicare Other | Source: Intra-hospital | Attending: Physical Medicine & Rehabilitation | Admitting: Physical Medicine & Rehabilitation

## 2014-12-12 DIAGNOSIS — N401 Enlarged prostate with lower urinary tract symptoms: Secondary | ICD-10-CM | POA: Diagnosis present

## 2014-12-12 DIAGNOSIS — G8222 Paraplegia, incomplete: Secondary | ICD-10-CM | POA: Diagnosis present

## 2014-12-12 DIAGNOSIS — I10 Essential (primary) hypertension: Secondary | ICD-10-CM | POA: Diagnosis not present

## 2014-12-12 DIAGNOSIS — R319 Hematuria, unspecified: Secondary | ICD-10-CM | POA: Diagnosis not present

## 2014-12-12 DIAGNOSIS — F329 Major depressive disorder, single episode, unspecified: Secondary | ICD-10-CM | POA: Diagnosis present

## 2014-12-12 DIAGNOSIS — E785 Hyperlipidemia, unspecified: Secondary | ICD-10-CM | POA: Diagnosis present

## 2014-12-12 DIAGNOSIS — R609 Edema, unspecified: Secondary | ICD-10-CM

## 2014-12-12 DIAGNOSIS — E1165 Type 2 diabetes mellitus with hyperglycemia: Secondary | ICD-10-CM | POA: Diagnosis not present

## 2014-12-12 DIAGNOSIS — M109 Gout, unspecified: Secondary | ICD-10-CM | POA: Diagnosis present

## 2014-12-12 DIAGNOSIS — R42 Dizziness and giddiness: Secondary | ICD-10-CM | POA: Diagnosis not present

## 2014-12-12 DIAGNOSIS — Z955 Presence of coronary angioplasty implant and graft: Secondary | ICD-10-CM | POA: Diagnosis not present

## 2014-12-12 DIAGNOSIS — E0965 Drug or chemical induced diabetes mellitus with hyperglycemia: Secondary | ICD-10-CM | POA: Diagnosis not present

## 2014-12-12 DIAGNOSIS — IMO0002 Reserved for concepts with insufficient information to code with codable children: Secondary | ICD-10-CM | POA: Diagnosis present

## 2014-12-12 DIAGNOSIS — Z8546 Personal history of malignant neoplasm of prostate: Secondary | ICD-10-CM

## 2014-12-12 DIAGNOSIS — K5900 Constipation, unspecified: Secondary | ICD-10-CM | POA: Diagnosis present

## 2014-12-12 DIAGNOSIS — R001 Bradycardia, unspecified: Secondary | ICD-10-CM | POA: Diagnosis not present

## 2014-12-12 DIAGNOSIS — T380X5A Adverse effect of glucocorticoids and synthetic analogues, initial encounter: Secondary | ICD-10-CM | POA: Diagnosis not present

## 2014-12-12 DIAGNOSIS — K592 Neurogenic bowel, not elsewhere classified: Secondary | ICD-10-CM | POA: Diagnosis not present

## 2014-12-12 DIAGNOSIS — G8220 Paraplegia, unspecified: Secondary | ICD-10-CM | POA: Diagnosis present

## 2014-12-12 DIAGNOSIS — C720 Malignant neoplasm of spinal cord: Secondary | ICD-10-CM | POA: Diagnosis not present

## 2014-12-12 DIAGNOSIS — I2510 Atherosclerotic heart disease of native coronary artery without angina pectoris: Secondary | ICD-10-CM | POA: Diagnosis present

## 2014-12-12 DIAGNOSIS — D492 Neoplasm of unspecified behavior of bone, soft tissue, and skin: Secondary | ICD-10-CM | POA: Insufficient documentation

## 2014-12-12 LAB — GLUCOSE, CAPILLARY
Glucose-Capillary: 195 mg/dL — ABNORMAL HIGH (ref 65–99)
Glucose-Capillary: 177 mg/dL — ABNORMAL HIGH (ref 65–99)
Glucose-Capillary: 193 mg/dL — ABNORMAL HIGH (ref 65–99)
Glucose-Capillary: 262 mg/dL — ABNORMAL HIGH (ref 65–99)
Glucose-Capillary: 209 mg/dL — ABNORMAL HIGH (ref 65–99)

## 2014-12-12 MED ORDER — ESCITALOPRAM OXALATE 10 MG PO TABS
10.0000 mg | ORAL_TABLET | Freq: Every day | ORAL | Status: DC
  Administered 2014-12-13 – 2014-12-25 (×13): 10 mg via ORAL
  Filled 2014-12-12 (×13): qty 1

## 2014-12-12 MED ORDER — TAMSULOSIN HCL 0.4 MG PO CAPS
0.4000 mg | ORAL_CAPSULE | Freq: Every day | ORAL | Status: DC
  Administered 2014-12-12 – 2014-12-24 (×13): 0.4 mg via ORAL
  Filled 2014-12-12 (×13): qty 1

## 2014-12-12 MED ORDER — BISACODYL 10 MG RE SUPP
10.0000 mg | Freq: Every day | RECTAL | Status: DC | PRN
  Administered 2014-12-12: 10 mg via RECTAL
  Filled 2014-12-12: qty 1

## 2014-12-12 MED ORDER — ALLOPURINOL 300 MG PO TABS
300.0000 mg | ORAL_TABLET | Freq: Every day | ORAL | Status: DC
  Administered 2014-12-13 – 2014-12-25 (×13): 300 mg via ORAL
  Filled 2014-12-12 (×13): qty 1

## 2014-12-12 MED ORDER — DEXAMETHASONE 4 MG PO TABS
4.0000 mg | ORAL_TABLET | Freq: Four times a day (QID) | ORAL | Status: DC
  Administered 2014-12-12 – 2014-12-15 (×11): 4 mg via ORAL
  Filled 2014-12-12 (×11): qty 1

## 2014-12-12 MED ORDER — IRBESARTAN 75 MG PO TABS
75.0000 mg | ORAL_TABLET | Freq: Every day | ORAL | Status: DC
  Administered 2014-12-13 – 2014-12-14 (×2): 75 mg via ORAL
  Filled 2014-12-12 (×3): qty 1

## 2014-12-12 MED ORDER — DEXAMETHASONE SODIUM PHOSPHATE 4 MG/ML IJ SOLN
4.0000 mg | Freq: Four times a day (QID) | INTRAMUSCULAR | Status: DC
  Filled 2014-12-12 (×15): qty 1

## 2014-12-12 MED ORDER — ALUM & MAG HYDROXIDE-SIMETH 200-200-20 MG/5ML PO SUSP: 30.0000 mL | Freq: Four times a day (QID) | ORAL | Status: DC | PRN

## 2014-12-12 MED ORDER — POLYETHYLENE GLYCOL 3350 17 G PO PACK
17.0000 g | PACK | Freq: Every day | ORAL | Status: DC | PRN
  Filled 2014-12-12: qty 1

## 2014-12-12 MED ORDER — ASPIRIN 81 MG PO CHEW
81.0000 mg | CHEWABLE_TABLET | Freq: Every day | ORAL | Status: DC
  Administered 2014-12-13 – 2014-12-25 (×13): 81 mg via ORAL
  Filled 2014-12-12 (×13): qty 1

## 2014-12-12 MED ORDER — SORBITOL 70 % SOLN: 30.0000 mL | Freq: Every day | Status: DC | PRN

## 2014-12-12 MED ORDER — METHOCARBAMOL 1000 MG/10ML IJ SOLN
500.0000 mg | Freq: Four times a day (QID) | INTRAVENOUS | Status: DC | PRN
  Filled 2014-12-12: qty 5

## 2014-12-12 MED ORDER — INSULIN ASPART 100 UNIT/ML ~~LOC~~ SOLN
0.0000 [IU] | Freq: Three times a day (TID) | SUBCUTANEOUS | Status: DC
  Administered 2014-12-13: 3 [IU] via SUBCUTANEOUS
  Administered 2014-12-13 – 2014-12-14 (×4): 5 [IU] via SUBCUTANEOUS
  Administered 2014-12-14 – 2014-12-15 (×2): 8 [IU] via SUBCUTANEOUS
  Administered 2014-12-15: 5 [IU] via SUBCUTANEOUS
  Administered 2014-12-15: 3 [IU] via SUBCUTANEOUS
  Administered 2014-12-16 (×2): 5 [IU] via SUBCUTANEOUS
  Administered 2014-12-16: 3 [IU] via SUBCUTANEOUS
  Administered 2014-12-17: 5 [IU] via SUBCUTANEOUS
  Administered 2014-12-17: 3 [IU] via SUBCUTANEOUS
  Administered 2014-12-17: 5 [IU] via SUBCUTANEOUS
  Administered 2014-12-18 (×2): 3 [IU] via SUBCUTANEOUS
  Administered 2014-12-18 – 2014-12-19 (×3): 5 [IU] via SUBCUTANEOUS
  Administered 2014-12-19: 2 [IU] via SUBCUTANEOUS
  Administered 2014-12-20 (×3): 3 [IU] via SUBCUTANEOUS
  Administered 2014-12-21 (×2): 5 [IU] via SUBCUTANEOUS
  Administered 2014-12-22 (×3): 3 [IU] via SUBCUTANEOUS
  Administered 2014-12-23 – 2014-12-24 (×3): 5 [IU] via SUBCUTANEOUS

## 2014-12-12 MED ORDER — METHOCARBAMOL 500 MG PO TABS: 500.0000 mg | ORAL_TABLET | Freq: Four times a day (QID) | ORAL | Status: DC | PRN

## 2014-12-12 MED ORDER — ROSUVASTATIN CALCIUM 20 MG PO TABS
20.0000 mg | ORAL_TABLET | Freq: Every day | ORAL | Status: DC
  Administered 2014-12-13 – 2014-12-25 (×13): 20 mg via ORAL
  Filled 2014-12-12 (×13): qty 1

## 2014-12-12 MED ORDER — CLOPIDOGREL BISULFATE 75 MG PO TABS
75.0000 mg | ORAL_TABLET | Freq: Every day | ORAL | Status: DC
  Administered 2014-12-12 – 2014-12-25 (×14): 75 mg via ORAL
  Filled 2014-12-12 (×14): qty 1

## 2014-12-12 MED ORDER — INSULIN ASPART 100 UNIT/ML ~~LOC~~ SOLN: 0.0000 [IU] | Freq: Three times a day (TID) | SUBCUTANEOUS | Status: DC

## 2014-12-12 MED ORDER — ONDANSETRON HCL 4 MG/2ML IJ SOLN: 4.0000 mg | Freq: Four times a day (QID) | INTRAMUSCULAR | Status: DC | PRN

## 2014-12-12 MED ORDER — DOCUSATE SODIUM 100 MG PO CAPS
100.0000 mg | ORAL_CAPSULE | Freq: Two times a day (BID) | ORAL | Status: DC
  Administered 2014-12-12 – 2014-12-24 (×22): 100 mg via ORAL
  Filled 2014-12-12 (×26): qty 1

## 2014-12-12 MED ORDER — PANTOPRAZOLE SODIUM 40 MG PO TBEC
40.0000 mg | DELAYED_RELEASE_TABLET | Freq: Every day | ORAL | Status: DC
  Administered 2014-12-12 – 2014-12-24 (×13): 40 mg via ORAL
  Filled 2014-12-12 (×13): qty 1

## 2014-12-12 MED ORDER — OXYBUTYNIN CHLORIDE ER 15 MG PO TB24
15.0000 mg | ORAL_TABLET | Freq: Every day | ORAL | Status: DC
Start: 2014-12-12 — End: 2014-12-25
  Administered 2014-12-12 – 2014-12-24 (×13): 15 mg via ORAL
  Filled 2014-12-12 (×14): qty 1

## 2014-12-12 MED ORDER — INSULIN ASPART 100 UNIT/ML ~~LOC~~ SOLN
0.0000 [IU] | Freq: Every day | SUBCUTANEOUS | Status: DC
  Administered 2014-12-12: 2 [IU] via SUBCUTANEOUS
  Administered 2014-12-13: 3 [IU] via SUBCUTANEOUS
  Administered 2014-12-14: 4 [IU] via SUBCUTANEOUS
  Administered 2014-12-15 – 2014-12-17 (×3): 2 [IU] via SUBCUTANEOUS
  Administered 2014-12-19: 3 [IU] via SUBCUTANEOUS
  Administered 2014-12-20 – 2014-12-21 (×2): 2 [IU] via SUBCUTANEOUS
  Administered 2014-12-22: 3 [IU] via SUBCUTANEOUS

## 2014-12-12 MED ORDER — ACETAMINOPHEN 650 MG RE SUPP: 650.0000 mg | RECTAL | Status: DC | PRN

## 2014-12-12 MED ORDER — INSULIN ASPART 100 UNIT/ML ~~LOC~~ SOLN: 0.0000 [IU] | Freq: Every day | SUBCUTANEOUS | Status: DC

## 2014-12-12 MED ORDER — AMLODIPINE BESYLATE 10 MG PO TABS
10.0000 mg | ORAL_TABLET | Freq: Every day | ORAL | Status: DC
  Administered 2014-12-13 – 2014-12-25 (×13): 10 mg via ORAL
  Filled 2014-12-12 (×13): qty 1

## 2014-12-12 MED ORDER — HYDROCODONE-ACETAMINOPHEN 5-325 MG PO TABS: 1.0000 | ORAL_TABLET | ORAL | Status: DC | PRN

## 2014-12-12 MED ORDER — ACETAMINOPHEN 325 MG PO TABS: 650.0000 mg | ORAL_TABLET | ORAL | Status: DC | PRN

## 2014-12-12 MED ORDER — ASPIRIN EC 81 MG PO TBEC: 81.0000 mg | DELAYED_RELEASE_TABLET | Freq: Every day | ORAL | Status: DC

## 2014-12-12 MED ORDER — ONDANSETRON HCL 4 MG PO TABS
4.0000 mg | ORAL_TABLET | Freq: Four times a day (QID) | ORAL | Status: DC | PRN
Start: 2014-12-12 — End: 2014-12-25

## 2014-12-12 NOTE — Progress Notes (Signed)
Meredith Staggers, MD Physician Signed Physical Medicine and Rehabilitation Consult Note 12/11/2014 5:55 AM  Related encounter: Admission (Discharged) from 12/09/2014 in [facility]        Physical Medicine and Rehabilitation Consult Reason for Consult: Thoracic laminectomy and resection of intramedullary spinal cord tumor Referring Physician: Dr. Vertell Limber   HPI: [name] is a 80 y.o. 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 Patient lives with his wife had been independent until recently. Presented 12/09/2014 with gait abnormality, numbness in his perineum region. Recent MRI imaging thoracic spine showed progression of abnormal signal and enhancement extending T10-T11 ependymoma increased from comparison 03/07/2014. Underwent redo thoracic laminectomy with intramedullary tumor resection 12/09/2014 for Dr. Vertell Limber. Hospital course pain management. Decadron protocol as indicated. Physical therapy evaluation completed 12/10/2014 with recommendations of physical medicine rehabilitation consult.   Review of Systems  Constitutional: Negative for fever and chills.  HENT: Negative for hearing loss.  Eyes: Negative for blurred vision and double vision.  Respiratory: Negative for cough and shortness of breath.  Cardiovascular: Positive for palpitations and leg swelling. Negative for chest pain.  Gastrointestinal: Positive for constipation. Negative for nausea and abdominal pain.  Genitourinary: Positive for frequency. Negative for hematuria.  Musculoskeletal: Positive for back pain.  Skin: Negative for rash.  Neurological: Negative for headaches.  Psychiatric/Behavioral: Positive for depression.   Past Medical History  Diagnosis Date  . History of prostate cancer 2004-- S/P SEED IMPLANTS     NO RECURRENCE    . S/P primary angioplasty with coronary stent 2006--     X2 DE STENTS - Cypher 2.5 mm postdilated to 2.75 mm (20 mm and 13 mm stents)  . Urgency of urination   . Frequency of urination   . H/O: gout STABLE  . Impaired hearing RIGHT HEARING AID  . Hydrocele, left   . History of melanoma excision 2010    FOREHEAD  . CAD S/P percutaneous coronary angioplasty CARDIOLOGIST- DR DAV[facility]    Myoview March 2014: No ischemia or infarction  . Diastolic dysfunction without heart failure     Moderate LVH, Gr 1 DD on Echo 2011; no CHF admissions, no tt on diuretisc  . Hyperlipidemia LDL goal <70   . Essential hypertension   . Colon polyps 05/10/2005    Hyperplastic polyps  . Depression   . OSA on CPAP     bipap  . Heart murmur     hx - no notable valvular lesion on Echo  . Cancer     prostate , melanoma head  . Arthritis   . Shingles 07  . Gout   . Complication of anesthesia     "takes long time to wake up"  . Diabetes mellitus type 2 with complications     neuropathy; CAD borderline- no med   Past Surgical History  Procedure Laterality Date  . Prostate palladium gold seed implants (118)  11-15-2002    PROSTATE CANCER  . Excision melanoma from forehead  2010  . Coronary angioplasty with stent placement  09-01-2004     Cypher DES x 2 d-m LCx 2.5 mm x 20 mm & 2.5 mm x 13 mm (~2.75 mm)  . Transthoracic echocardiogram  MAR [id number]    NORMAL EF > 55% / NORMAL FUNCTION WITH IMPAIRED RELAXATION AND MODERATE CONCENTRIC LVH LIKELY HYPERTENSIVE DISEASE  .  Hydrocele excision  10/14/2011    Procedure: HYDROCELECTOMY ADULT; Surgeon: Ailene Rud, MD; Location: [facility]; Service: Urology; Laterality: Left;  . Circumsision    . Laminectomy N/A 07/13/2012    Procedure: THORACIC LAMINECTOMY FOR RESECTION OF INTRAMEDULLARY SPINAL  CHORD TUMOR WITH SPINAL CHORD MONITORING; Surgeon: Erline Levine, MD; Location: [place] [facility]; Service: Neurosurgery; Laterality: N/A; Thoracic laminectomy for resection of intramedullary spinal cord tumor with spinal cord monitering and Dr. Christella Noa to assist  . Nm myoview ltd  [id number]    Lexiscan: LOW RISK, mild inferior bowel artifact; No ischemia or Infarction  . Eye surgery Bilateral     cataracts  . Tonsillectomy    . Laminectomy N/A 12/09/2014    Procedure: Redo Thoracic laminectomy with intramedullary tumar resection/USN/[place]/Subarachnoid shunt/spinal cord monitoring/Dr. Kathyrn Sheriff to assist; Surgeon: Erline Levine, MD; Location: [place] [facility]; Service: Neurosurgery; Laterality: N/A; Redo Thoracic laminectomy with intramedullary tumar resection/USN/[place]/Subarachnoid shunt/spinal cord monitoring/Dr. Kathyrn Sheriff to assist   Family History  Problem Relation Age of Onset  . Hypertension Mother   . Stroke Mother   . Hypertension Father   . Prostate cancer Father   . Hypertension Brother   . Prostate cancer Brother    Social History:  reports that he has never smoked. He has never used smokeless tobacco. He reports that he drinks alcohol. He reports that he does not use illicit drugs. Allergies:  Allergies  Allergen Reactions  . Betadine [Povidone Iodine] Anaphylaxis  . Contrast Media [Iodinated Diagnostic Agents] Anaphylaxis and Rash    Other Reaction: Other reaction  . Iodine Anaphylaxis    Shellfish, IVP dye. Swelling in throat and foaming at mouth.  . Shellfish Allergy Anaphylaxis   Medications Prior to Admission  Medication Sig Dispense Refill  . allopurinol (ZYLOPRIM) 300 MG tablet Take 300 mg by mouth daily.     Marland Kitchen amLODipine (NORVASC) 10 MG tablet Take 10 mg by mouth daily.     Marland Kitchen aspirin EC 81 MG tablet Take 81 mg by mouth daily.    . clopidogrel (PLAVIX) 75 MG tablet Take 1  tablet by mouth daily.    Marland Kitchen escitalopram (LEXAPRO) 10 MG tablet Take 10 mg by mouth daily.     Marland Kitchen oxybutynin (DITROPAN XL) 15 MG 24 hr tablet Take 15 mg by mouth at bedtime.    . rosuvastatin (CRESTOR) 20 MG tablet Take 20 mg by mouth daily.    . tamsulosin (FLOMAX) 0.4 MG CAPS Take 0.4 mg by mouth at bedtime.    . valsartan (DIOVAN) 80 MG tablet Take 80 mg by mouth daily.     Marland Kitchen VITAMIN D, CHOLECALCIFEROL, PO Take 1 tablet by mouth daily.       Home: Home Living Family/patient expects to be discharged to:: Private residence Living Arrangements: Spouse/significant other Available Help at Discharge: Available 24 hours/day Type of Home: House Home Access: Stairs to enter Technical brewer of Steps: 3 Entrance Stairs-Rails: Left Home Layout: Multi-level Alternate Level Stairs-Number of Steps: 8 Alternate Level Stairs-Rails: Right Bathroom Shower/Tub: Chiropodist: Handicapped height Bathroom Accessibility: Yes Home Equipment: Civil engineer, contracting, Environmental consultant - 2 wheels Additional Comments: wife's bathroom more accessible  Functional History: Prior Function Level of Independence: Independent Functional Status:  Mobility: Bed Mobility Overal bed mobility: Needs Assistance Bed Mobility: Sit to Sidelying, Sidelying to Sit, Rolling Rolling: Supervision Sidelying to sit: Supervision Sit to sidelying: Supervision, HOB elevated (vc for sequencing) General bed mobility comments: ataxic LE's but safe Transfers Overall transfer level: Needs assistance Equipment  used: Rolling walker (2 wheeled) Transfers: Sit to/from Stand Sit to Stand: +2 physical assistance, Mod assist Stand pivot transfers: Mod assist, +2 physical assistance General transfer comment: cues for hand placement  Ambulation/Gait Ambulation/Gait assistance: Mod assist, +2 physical assistance Ambulation Distance (Feet): 15 Feet (x2 to/from bathroom and 6' to/from the  sink) Assistive device: Rolling walker (2 wheeled) Gait Pattern/deviations: Step-through pattern, Ataxic General Gait Details: Very ataxic gait, R worse than L, worse when not looking Gait velocity interpretation: Below normal speed for age/gender    ADL: ADL Overall ADL's : Needs assistance/impaired Grooming: Set up, Sitting Upper Body Bathing: Set up, Sitting Lower Body Bathing: Moderate assistance, Sit to/from stand Upper Body Dressing : Set up, Sitting Lower Body Dressing: Moderate assistance, Sit to/from stand Toilet Transfer: +2 for physical assistance, Maximal assistance, RW, Ambulation Toileting- Clothing Manipulation and Hygiene: Moderate assistance, Sit to/from stand Functional mobility during ADLs: +2 for physical assistance, Maximal assistance, Rolling walker, Cueing for safety, Cueing for sequencing (atqaxic gait pattern) General ADL Comments: unsafe and high risk for falls during mobility for ADL. Impulsive at baseline.  Cognition: Cognition Overall Cognitive Status: Within Functional Limits for tasks assessed Orientation Level: Oriented X4 Cognition Arousal/Alertness: Awake/alert Behavior During Therapy: Impulsive Overall Cognitive Status: Within Functional Limits for tasks assessed  Blood pressure 150/66, pulse 63, temperature 97.7 F (36.5 C), temperature source Oral, resp. rate 18, weight 99.338 kg (219 lb), SpO2 93 %. Physical Exam  Constitutional: He is oriented to person, place, and time. He appears well-developed.  HENT:  Head: Normocephalic.  Eyes: EOM are normal.  Neck: Normal range of motion. Neck supple. No thyromegaly present.  Cardiovascular: Normal rate and regular rhythm.  Respiratory: Effort normal and breath sounds normal. No respiratory distress.  GI: Soft. Bowel sounds are normal. He exhibits no distension.  Neurological: He is alert and oriented to person, place, and time.  Decreased LT/pain in bilateral lower extremities from upper thigh  and distally. Decreased gross coordination of both legs, right more than left. RLE: 4/5 hf, 3+ke and adf/apf. LLE: 4/5 hf, 4ke and 4-adf/apf. Good sitting balance. ue motor and sensory are normal. Cognitively he's intact  Skin:  Incision site is dressed clean and dry  Psychiatric: He has a normal mood and affect. His behavior is normal.     Lab Results Last 24 Hours    Results for orders placed or performed during the hospital encounter of 12/09/14 (from the past 24 hour(s))  Glucose, capillary Status: Abnormal   Collection Time: 12/10/14 8:42 AM  Result Value Ref Range   Glucose-Capillary 158 (H) 65 - 99 mg/dL  Glucose, capillary Status: Abnormal   Collection Time: 12/10/14 12:24 PM  Result Value Ref Range   Glucose-Capillary 194 (H) 65 - 99 mg/dL   Comment 1 Notify RN    Comment 2 Document in Chart   Glucose, capillary Status: Abnormal   Collection Time: 12/10/14 4:51 PM  Result Value Ref Range   Glucose-Capillary 173 (H) 65 - 99 mg/dL   Comment 1 Notify RN    Comment 2 Document in Chart   Glucose, capillary Status: Abnormal   Collection Time: 12/10/14 9:27 PM  Result Value Ref Range   Glucose-Capillary 195 (H) 65 - 99 mg/dL  Glucose, capillary Status: Abnormal   Collection Time: 12/10/14 11:55 PM  Result Value Ref Range   Glucose-Capillary 157 (H) 65 - 99 mg/dL   Comment 1 Notify RN    Comment 2 Document in Chart   Glucose, capillary Status:  Abnormal   Collection Time: 12/11/14 4:01 AM  Result Value Ref Range   Glucose-Capillary 168 (H) 65 - 99 mg/dL   Comment 1 Notify RN    Comment 2 Document in Chart       Imaging Results (Last 48 hours)    [place] Intraoperative  12/09/2014 CLINICAL DATA: Ultrasound was provided for use by the ordering physician, and a technical charge was applied by the performing facility. No radiologist  interpretation/professional services rendered.     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 Does the need for close, 24 hr/day medical supervision in concert with the patient's rehab needs make it unreasonable for this patient to be served in a less intensive setting? Yes 2. Co-Morbidities requiring supervision/potential complications: CAD, hx prostate cancer, htn 3. Due to bladder management, bowel management, safety, skin/wound care, disease management, medication administration, pain management and patient education, does the patient require 24 hr/day rehab nursing? Yes 4. Does the patient require coordinated care of a physician, rehab nurse, PT (1-2 hrs/day, 5 days/week) and OT (1-2 hrs/day, 5 days/week) to address physical and functional deficits in the context of the above medical diagnosis(es)? Yes Addressing deficits in the following areas: balance, endurance, locomotion, strength, transferring, bowel/bladder control, bathing, dressing, feeding, grooming and toileting 5. Can the patient actively participate in an intensive therapy program of at least 3 hrs of therapy per day at least 5 days per week? Yes 6. The potential for patient to make measurable gains while on inpatient rehab is excellent 7. Anticipated functional outcomes upon discharge from inpatient rehab are modified independent with PT, modified independent with OT, n/a with SLP. 8. Estimated rehab length of stay to reach the above functional goals is: 10-14 days 9. Does the patient have adequate social supports and living environment to accommodate these discharge functional goals? Yes 10. Anticipated D/C setting: Home 11. Anticipated post D/C treatments: HH therapy and Outpatient therapy 12. Overall Rehab/Functional Prognosis: excellent  RECOMMENDATIONS: This patient's condition is appropriate for continued rehabilitative care in the following setting:  CIR Patient has agreed to participate in recommended program. Yes Note that insurance prior authorization may be required for reimbursement for recommended care.  Comment: Rehab Admissions Coordinator to follow up.  Thanks,  Meredith Staggers, MD, Mellody Drown     12/11/2014       Revision History     Date/Time User Provider Type Action   12/11/2014 8:34 AM Meredith Staggers, MD Physician Sign   12/11/2014 6:25 AM Cathlyn Parsons, PA-C Physician Assistant Pend   View Details Report       Routing History     Date/Time From To Method   12/11/2014 8:34 AM Meredith Staggers, MD Meredith Staggers, MD In Basket   12/11/2014 8:34 AM Meredith Staggers, MD Lavone Orn, MD Fax

## 2014-12-12 NOTE — PMR Pre-admission (Signed)
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 Date: A=01-03-03 and B=10/03/03     Deduct: $1288      Out of Pocket Max: none      Life Max: unlimited CIR: 100%      SNF: 100 days Outpatient: 80%     Co-Pay: 20% Home Health: 100%      Co-Pay: none DME: 80%     Co-Pay: 20% Providers: patient's choice  SECONDARY: [employer]      Policy#: [id number]      Subscriber: Ree Shay CM Name:        Phone#:       Fax#:   Pre-Cert#:        Employer: Retired Benefits:  Phone #: [phone]     Name:   Eff. 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 Patient lives with his wife had been independent until recently. Presented 12/09/2014 with gait abnormality, numbness in his perineum region. Recent MRI imaging thoracic spine showed progression of abnormal signal and enhancement extending T10-T11 ependymoma increased from comparison 03/07/2014.  Underwent redo thoracic laminectomy with intramedullary tumor resection 12/09/2014 for Dr. Vertell Limber. Hospital course pain management. Decadron protocol as indicated. Pathology report pending. Bout of hematuria felt to be related to Foley tube irritation and monitor. Physical and occupational therapy evaluations completed 12/10/2014 with recommendations of physical medicine rehabilitation consult. Patient to be admitted for a comprehensive inpatient rehabilitation program.  Past Medical History  Past Medical History  Diagnosis Date  . History of prostate cancer 2004--  S/P SEED IMPLANTS     NO RECURRENCE  . S/P primary angioplasty with coronary stent 2006--      X2 DE STENTS - [place]her 2.5 mm postdilated to 2.75 mm (20 mm and 13 mm stents)  . Urgency of urination   . Frequency of urination   . H/O: gout STABLE  .  Impaired hearing RIGHT HEARING AID  . Hydrocele, left   . History of melanoma excision 2010    FOREHEAD  . CAD S/P percutaneous coronary angioplasty CARDIOLOGIST-  DR DAVID [facility]    Myoview March 2014: No ischemia or infarction  . Diastolic dysfunction without heart failure     Moderate LVH, Gr 1 DD on Echo 2011; no CHF admissions, no tt on diuretisc  . Hyperlipidemia LDL goal <70   . Essential hypertension   . Colon polyps 05/10/2005    Hyperplastic polyps  . Depression   . OSA on CPAP     bipap  . Heart murmur     hx - no notable valvular lesion on Echo  . Cancer     prostate , melanoma head  . Arthritis   . Shingles 07  . Gout   . Complication of anesthesia     "takes long time to wake up"  . Diabetes mellitus type 2 with complications     neuropathy; CAD borderline- no med    Family History  family history includes Hypertension in his brother, father, and mother; Prostate cancer in his brother and father; Stroke in his mother.  Prior Rehab/Hospitalizations:  Has the patient had major surgery during 100 days prior to admission? Yes.  Had cataract surgery on both  his eyes 2 months ago as an outpatient procedure.  Current Medications   Current facility-administered medications:  .  0.9 %  sodium chloride infusion, 250 mL, Intravenous, Continuous, Erline Levine, MD .  0.9 % NaCl with KCl 20 mEq/ L  infusion, , Intravenous, Continuous, Erline Levine, MD, Stopped at 12/10/14 0900 .  acetaminophen (TYLENOL) tablet 650 mg, 650 mg, Oral, Q4H PRN **OR** acetaminophen (TYLENOL) suppository 650 mg, 650 mg, Rectal, Q4H PRN, Erline Levine, MD .  allopurinol (ZYLOPRIM) tablet 300 mg, 300 mg, Oral, Daily, Erline Levine, MD, 300 mg at 12/12/14 0958 .  alum & mag hydroxide-simeth (MAALOX/MYLANTA) 200-200-20 MG/5ML suspension 30 mL, 30 mL, Oral, Q6H PRN, Erline Levine, MD .  amLODipine (NORVASC) tablet 10 mg, 10 mg, Oral, Daily, Erline Levine, MD, 10 mg at 12/12/14 0958 .  aspirin EC tablet 81 mg, 81 mg, Oral, Daily, Erline Levine, MD, 81 mg at 12/12/14 0958 .  bisacodyl (DULCOLAX) suppository 10 mg, 10 mg, Rectal, Daily PRN, Erline Levine, MD .  dexamethasone (DECADRON) injection 4 mg, 4 mg, Intravenous, 4 times per day, 4 mg at 12/10/14 0504 **OR** dexamethasone (DECADRON) tablet 4 mg, 4 mg, Oral, 4 times per day, Erline Levine, MD, 4 mg at 12/12/14 0552 .  dextrose 5 % and 0.45 % NaCl with KCl 20 mEq/L infusion, , Intravenous, Continuous, Erline Levine, MD .  docusate sodium (COLACE) capsule 100 mg, 100 mg, Oral, BID, Erline Levine, MD, 100 mg at 12/12/14 0958 .  escitalopram (LEXAPRO) tablet 10 mg, 10 mg, Oral, Daily, Erline Levine, MD, 10 mg at 12/12/14 0958 .  HYDROcodone-acetaminophen (NORCO/VICODIN) 5-325 MG per tablet 1-2 tablet, 1-2 tablet, Oral, Q4H PRN, Erline Levine, MD, 2 tablet at 12/09/14 1433 .  HYDROmorphone (DILAUDID) injection 0.5-1 mg, 0.5-1 mg, Intravenous, Q2H PRN, Erline Levine, MD, 1 mg at 12/09/14 1456 .  insulin aspart (novoLOG) injection 0-15 Units, 0-15 Units, Subcutaneous, 6 times per day, Erline Levine, MD, 3 Units at 12/12/14 0800 .  irbesartan (AVAPRO)  tablet 75 mg, 75 mg, Oral, Daily, Erline Levine, MD, 75 mg at 12/12/14 0957 .  menthol-cetylpyridinium (CEPACOL) lozenge 3 mg, 1 lozenge, Oral, PRN **OR** phenol (CHLORASEPTIC) mouth  spray 1 spray, 1 spray, Mouth/Throat, PRN, Erline Levine, MD .  methocarbamol (ROBAXIN) tablet 500 mg, 500 mg, Oral, Q6H PRN **OR** methocarbamol (ROBAXIN) 500 mg in dextrose 5 % 50 mL IVPB, 500 mg, Intravenous, Q6H PRN, Erline Levine, MD .  ondansetron [facility]) injection 4 mg, 4 mg, Intravenous, Q4H PRN, Erline Levine, MD .  oxybutynin (DITROPAN XL) 24 hr tablet 15 mg, 15 mg, Oral, QHS, Erline Levine, MD, 15 mg at 12/11/14 2150 .  oxyCODONE-acetaminophen (PERCOCET/ROXICET) 5-325 MG per tablet 1-2 tablet, 1-2 tablet, Oral, Q4H PRN, Erline Levine, MD, 2 tablet at 12/09/14 2043 .  pantoprazole (PROTONIX) EC tablet 40 mg, 40 mg, Oral, QHS, Erline Levine, MD, 40 mg at 12/11/14 2147 .  polyethylene glycol (MIRALAX / GLYCOLAX) packet 17 g, 17 g, Oral, Daily PRN, Erline Levine, MD, 17 g at 12/11/14 1129 .  rosuvastatin (CRESTOR) tablet 20 mg, 20 mg, Oral, Daily, Erline Levine, MD, 20 mg at 12/12/14 1004 .  sodium chloride 0.9 % injection 3 mL, 3 mL, Intravenous, Q12H, Erline Levine, MD, 3 mL at 12/12/14 0959 .  sodium chloride 0.9 % injection 3 mL, 3 mL, Intravenous, PRN, Erline Levine, MD .  sodium phosphate (FLEET) 7-19 GM/118ML enema 1 enema, 1 enema, Rectal, Once PRN, Erline Levine, MD .  tamsulosin [facility]) capsule 0.4 mg, 0.4 mg, Oral, QHS, Erline Levine, MD, 0.4 mg at 12/11/14 2147  Patients Current Diet: Diet Carb Modified Fluid consistency:: Thin; Room service appropriate?: Yes  Precautions / Restrictions Precautions Precautions: Fall Precaution Comments: laminectomy, ataxic gait Restrictions Weight Bearing Restrictions: No   Has the patient had 2 or more falls or a fall with injury in the past year?No.  However, he did have one fall 6-8 months ago with no injury.  Prior Activity Level Community (5-7x/wk): Went out 3-4 X  a week.  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  Home Assistive Devices / Equipment Home Assistive Devices/Equipment: Eyeglasses Home Equipment: Shower seat, Environmental consultant - 2 wheels  Prior Device Use: Indicate devices/aids used by the patient prior to current illness, exacerbation or injury? None  Prior Functional Level Prior Function Level of Independence: Independent  Self Care: Did the patient need help bathing, dressing, using the toilet or eating?  Independent  Indoor Mobility: Did the patient need assistance with walking from room to room (with or without device)? Independent  Stairs: Did the patient need assistance with internal or external stairs (with or without device)? Independent  Functional Cognition: Did the patient need help planning regular tasks such as shopping or remembering to take medications? Independent.  Patient says wife cooks and he cleans.  Current Functional Level Cognition  Overall Cognitive Status: Within Functional Limits for tasks assessed Orientation Level: Oriented X4    Extremity Assessment (includes Sensation/Coordination)  Upper Extremity Assessment: Defer to OT evaluation  Lower Extremity Assessment: RLE deficits/detail, LLE deficits/detail RLE Deficits / Details: strength WFL. poor proprioception. decreased sensation. ataxic Appears more impaired than L RLE Sensation: decreased light touch, decreased proprioception RLE Coordination: decreased gross motor, decreased fine motor LLE Deficits / Details: strength WFL. poor sensation and proprioception. ataxic LLE Sensation: decreased light touch, decreased proprioception LLE Coordination: decreased gross motor, decreased fine motor    ADLs  Overall ADL's : Needs assistance/impaired Grooming: Min guard, Standing, Wash/dry hands, Wash/dry face Upper Body Bathing: Set up, Sitting Lower Body Bathing: Moderate assistance, Sit to/from stand Upper Body Dressing : Set up, Sitting Lower Body Dressing:  Supervision/safety, Sit to/from stand (socks only) Lower Body Dressing  Details (indicate cue type and reason): required mod assist (+2 for safety) for sit<>stand for safety and due to ataxic movements  Toilet Transfer: +2 for physical assistance, Maximal assistance, RW, Ambulation Toileting- Clothing Manipulation and Hygiene: Moderate assistance, Sit to/from stand Functional mobility during ADLs: +2 for physical assistance, Maximal assistance, Rolling walker, Cueing for safety, Cueing for sequencing (atqaxic gait pattern) General ADL Comments: Pt able to cross BLEs for LB ADLs. Pt stood at sink for grooming tasks of washing hands and washing face using wash cloth. Pt tended to have right lateral lean in standing and would lean up against counter, cues needed to come to midline and maintain dynamic standing balance with RW.     Mobility  Overal bed mobility: Needs Assistance Bed Mobility: Rolling, Sidelying to Sit, Supine to Sit Rolling: Supervision Sidelying to sit: Supervision Sit to sidelying: Supervision General bed mobility comments: use of bed rail and appropriate log roll technique    Transfers  Overall transfer level: Needs assistance Equipment used: Rolling walker (2 wheeled) Transfers: Sit to/from Stand Sit to Stand: +2 physical assistance, Mod assist Stand pivot transfers: Mod assist, +2 physical assistance General transfer comment: VCs for hand placement and cues for power up to standing     Ambulation / Gait / Stairs / Wheelchair Mobility  Ambulation/Gait Ambulation/Gait assistance: Mod assist, +2 physical assistance Ambulation Distance (Feet): 120 Feet (multiple stopping breaks for education ) Assistive device: Rolling walker (2 wheeled) Gait Pattern/deviations: Step-through pattern, Decreased stride length, Drifts right/left, Ataxic General Gait Details: assist for safety due to impulsive and poor proprioception of right LE, lateral LOB to right, increased Physical assist  during gait re-training sequencing with good carry over. utilized visual cues for placement of RLE with tactile cues for postural faciliation. Gait velocity: decreased Gait velocity interpretation: Below normal speed for age/gender    Posture / Balance Balance Overall balance assessment: Needs assistance Sitting-balance support: Feet supported, No upper extremity supported Sitting balance-Leahy Scale: Good Standing balance support: Bilateral upper extremity supported Standing balance-Leahy Scale: Poor Standing balance comment: VCs for increased BOS, initially relying on counter for assist, tactile cues for postural alignment and stabilty    Special needs/care consideration BiPAP/CPAP Yes, CPAP CPM NO Continuous Drip IV No Dialysis No         Life Vest No Oxygen No Special Bed No Trach Size No Wound Vac (area) No      Skin Lumbar incision with dressing                             Bowel mgmt: Last BM 12/07/14 Bladder mgmt: Voiding in the urinal Diabetic mgmt: New diabetic, now on insulin    Previous Home Environment Living Arrangements: Spouse/significant other Available Help at Discharge: Available 24 hours/day Type of Home: House Home Layout: Multi-level Alternate Level Stairs-Rails: Right Alternate Level Stairs-Number of Steps: 8 Home Access: Stairs to enter Entrance Stairs-Rails: Left Entrance Stairs-Number of Steps: 3 Bathroom Shower/Tub: Chiropodist: Handicapped height Bathroom Accessibility: Yes How Accessible: Accessible via walker Home Care Services: No Additional Comments: wife's bathroom more accessible  Discharge Living Setting Plans for Discharge Living Setting: Patient's home, House, Lives with (comment) (Lives with wife.) Type of Home at Discharge: House Discharge Home Layout: Multi-level.  No bathroom on main level. Two full baths upstairs and 1 full bath downstairs.   Alternate Level Stairs-Rails: Right Alternate Level Stairs-Number  of Steps: 8 Discharge Home Access: Stairs to enter  Entrance Stairs-Rails: Left Entrance Stairs-Number of Steps: 3 Discharge Bathroom Shower/Tub: Tub/shower unit Discharge Bathroom Toilet: Handicapped height Does the patient have any problems obtaining your medications?: No  Social/Family/Support Systems Patient Roles: Spouse, Parent (Has a wife and children who are grown.) Contact Information: Taiwo Gagliano - wife Anticipated Caregiver: wife and self Anticipated Caregiver's Contact Information: Archie Patten - wife [phone] Ability/Limitations of Caregiver: Wife does not work and can Veterinary surgeon Availability: 24/7 Discharge Plan Discussed with Primary Caregiver: Yes Is Caregiver In Agreement with Plan?: Yes Does Caregiver/Family have Issues with Lodging/Transportation while Pt is in Rehab?: No  Goals/Additional Needs Patient/Family Goal for Rehab: PT/OT mod I goals Expected length of stay: 10-14 days Cultural Considerations: Jewish, raised in [place] home Dietary Needs: Carb mod, med calorie, thin liquids Equipment Needs: TBD Pt/Family Agrees to Admission and willing to participate: Yes Program Orientation Provided & Reviewed with Pt/Caregiver Including Roles  & Responsibilities: Yes  Decrease burden of Care through IP rehab admission: N/A  Possible need for SNF placement upon discharge: Not anticipated  Patient Condition: This patient's condition remains as documented in the consult dated 12/11/14, in which the Rehabilitation Physician determined and documented that the patient's condition is appropriate for intensive rehabilitative care in an inpatient rehabilitation facility. Will admit to inpatient rehab today.  Preadmission Screen Completed By:  [name], 12/12/2014 11:12 AM ______________________________________________________________________   Discussed status with Dr. Naaman Plummer on 12/12/14 at 1108 and received telephone approval for admission today.  Admission  Coordinator:  [name], time1108/Date09/09/16

## 2014-12-12 NOTE — Progress Notes (Signed)
Physical Therapy Treatment Patient Details Name: [name] MRN: [id number] DOB: 1935-05-09 Today's Date: 12/12/2014    History of Present Illness Patient is a 79 y/o male with history of s/p thoracic laminectomy and resection of intramedullary spinal cord tumor    PT Comments    Patient seen for progression of gait re-training. Tolerated well with assist and multimodal cues. Current POC remains appropriate.  Follow Up Recommendations  CIR     Equipment Recommendations  Rolling walker with 5" wheels    Recommendations for Other Services Rehab consult     Precautions / Restrictions Precautions Precautions: Fall Precaution Comments: laminectomy Restrictions Weight Bearing Restrictions: No    Mobility  Bed Mobility Overal bed mobility: Needs Assistance Bed Mobility: Rolling;Sidelying to Sit;Supine to Sit Rolling: Supervision Sidelying to sit: Supervision     Sit to sidelying: Supervision General bed mobility comments: use of bed rail and appropriate log roll technique  Transfers Overall transfer level: Needs assistance Equipment used: Rolling walker (2 wheeled) Transfers: Sit to/from Stand Sit to Stand: +2 physical assistance;Mod assist         General transfer comment: VCs for hand placement and cues for power up to standing   Ambulation/Gait Ambulation/Gait assistance: Mod assist;+2 physical assistance Ambulation Distance (Feet): 120 Feet (multiple stopping breaks for education ) Assistive device: Rolling walker (2 wheeled) Gait Pattern/deviations: Step-through pattern;Decreased stride length;Drifts right/left;Ataxic Gait velocity: decreased   General Gait Details: assist for safety due to impulsive and poor proprioception of right LE, lateral LOB to right, increased Physical assist during gait re-training sequencing with good carry over. utilized visual cues for placement of RLE with tactile cues for postural faciliation.   Stairs             Wheelchair Mobility    Modified Rankin (Stroke Patients Only)       Balance     Sitting balance-Leahy Scale: Good     Standing balance support: Bilateral upper extremity supported Standing balance-Leahy Scale: Poor Standing balance comment: VCs for increased BOS, initially relying on counter for assist, tactile cues for postural alignment and stabilty                    Cognition Arousal/Alertness: Awake/alert Behavior During Therapy: Impulsive Overall Cognitive Status: Within Functional Limits for tasks assessed                      Exercises      General Comments        Pertinent Vitals/Pain Pain Assessment: No/denies pain    Home Living                      Prior Function            PT Goals (current goals can now be found in the care plan section) Acute Rehab PT Goals Patient Stated Goal: to be able to walk so I can work PT Goal Formulation: With patient Time For Goal Achievement: 12/24/14 Potential to Achieve Goals: Good Progress towards PT goals: Progressing toward goals    Frequency  Min 5X/week    PT Plan Current plan remains appropriate    Co-evaluation PT/OT/SLP Co-Evaluation/Treatment:  (dove tailed session with OT)           End of Session Equipment Utilized During Treatment: Gait belt Activity Tolerance: Patient tolerated treatment well Patient left: in bed;with call bell/phone within reach;with bed alarm set     Time: [id number] PT Time  Calculation (min) (ACUTE ONLY): 9 min  Charges:  $Gait Training: 8-22 mins                    G CodesDuncan Dull 01-10-2015, 10:58 AM Alben Deeds, PT DPT  [phone]

## 2014-12-12 NOTE — Progress Notes (Signed)
Subjective: Patient reports doing well  Objective: Vital signs in last 24 hours: Temp:  [97.8 F (36.6 C)-98.4 F (36.9 C)] 97.8 F (36.6 C) (09/09 0605) Pulse Rate:  [57-71] 57 (09/09 0605) Resp:  [18-20] 18 (09/09 0605) BP: (156-164)/(70-81) 164/81 mmHg (09/09 0605) SpO2:  [92 %-95 %] 92 % (09/09 0605) Weight:  [102.649 kg (226 lb 4.8 oz)] 102.649 kg (226 lb 4.8 oz) (09/08 1753)  Intake/Output from previous day: 09/08 0701 - 09/09 0700 In: -  Out: 555 [Urine:555] Intake/Output this shift: Total I/O In: 240 [P.O.:240] Out: -   Marked progress with PT late yesterday.  Agreeable with plan for CIR today if possible.  Headaches much improved. Incision flat without erythema or drainage.   Lab Results: No results for input(s): WBC, HGB, HCT, PLT in the last 72 hours. BMET No results for input(s): NA, K, CL, CO2, GLUCOSE, BUN, CREATININE, CALCIUM in the last 72 hours.  Studies/Results: Mr Thoracic Spine W Wo Contrast  12/11/2014   CLINICAL DATA:  Status post surgery for T10-11 spinal cord tumor. History of ependymoma resection.  EXAM: MRI THORACIC SPINE WITHOUT AND WITH CONTRAST  TECHNIQUE: Multiplanar and multiecho pulse sequences of the thoracic spine were obtained without and with intravenous contrast.  CONTRAST:  12mL MULTIHANCE GADOBENATE DIMEGLUMINE 529 MG/ML IV SOLN  COMPARISON:  08/08/2014  FINDINGS: Status post redo laminotomy from the lower T9 to upper T12 for treatment of ependymoma. The large majority of enhancing tumor seen previously is no longer visible. Additionally, a focal syrinx from T9-T11 has been partially decompressed, now 5 mm in maximal diameter, previously 7 mm. Punctate enhancement present along the superficial dorsal cord bilaterally along the presumed myelopathy defect, indeterminate between minimal residual tumor and postoperative enhancement. Susceptibility artifact within the incision and at the level of the lower syrinx, expected postoperatively. Incisional  and laminectomy defect fluid with mild mass effect on the dorsal dura, but no significant mass effect on the cord.  Small layering pleural effusions.  Diffuse spondylosis and facet arthropathy. Notable T5-6 left foraminal, T10-11 left foraminal, and T2-3 right foraminal stenosis from facet overgrowth.  IMPRESSION: 1. Recent resection of presumed lower cord ependymoma recurrence. Minimal enhancement along the dorsal cord, at the presumed myelotomy defect, which could be postoperative or minimal residual tumor. Lower cord cyst has been partially decompressed. 2. Expected incisional fluid with mild dural mass effect at the laminectomy.   Electronically Signed   By: Monte Fantasia M.D.   On: 12/11/2014 11:08    Assessment/Plan: Improving   LOS: 3 days  D/C to CIR today.   Verdis Prime 12/12/2014, 10:49 AM    Much improved gait yesterday with Rehab.  Will benefit from Rehab stay.  Continuing to improve.

## 2014-12-12 NOTE — H&P (View-Only) (Signed)
Physical Medicine and Rehabilitation Admission H&P    Chief complaint: Weakness  HPI: [name] is a 79 y.o. 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 Patient lives with his wife had been independent until recently. Presented 12/09/2014 with gait abnormality, numbness in his perineum region. Recent MRI imaging thoracic spine showed progression of abnormal signal and enhancement extending T10-T11 ependymoma increased from comparison 03/07/2014. Underwent redo thoracic laminectomy with intramedullary tumor resection 12/09/2014 for Dr. Vertell Limber. Hospital course pain management. Decadron protocol as indicated. Pathology report pending. Bout of hematuria felt to be related to Foley tube irritation and monitor. Physical and occupational therapy evaluations completed 12/10/2014 with recommendations of physical medicine rehabilitation consult. Patient was admitted for a comprehensive rehabilitation program  ROS Review of Systems  Constitutional: Negative for fever and chills.  HENT: Negative for hearing loss.  Eyes: Negative for blurred vision and double vision.  Respiratory: Negative for cough and shortness of breath.  Cardiovascular: Positive for palpitations and leg swelling. Negative for chest pain.  Gastrointestinal: Positive for constipation. Negative for nausea and abdominal pain.  Genitourinary: Positive for frequency. Negative for hematuria.  Musculoskeletal: Positive for back pain.  Skin: Negative for rash.  Neurological: Negative for headaches.  Psychiatric/Behavioral: Positive for depression Past Medical History  Diagnosis Date  . History of prostate cancer 2004--  S/P SEED IMPLANTS     NO RECURRENCE  . S/P primary angioplasty with coronary stent 2006--      X2 DE STENTS - Cypher 2.5 mm postdilated to 2.75 mm (20 mm  and 13 mm stents)  . Urgency of urination   . Frequency of urination   . H/O: gout STABLE  . Impaired hearing RIGHT HEARING AID  . Hydrocele, left   . History of melanoma excision 2010    FOREHEAD  . CAD S/P percutaneous coronary angioplasty CARDIOLOGIST-  DR DAVID [facility]    Myoview March 2014: No ischemia or infarction  . Diastolic dysfunction without heart failure     Moderate LVH, Gr 1 DD on Echo 2011; no CHF admissions, no tt on diuretisc  . Hyperlipidemia LDL goal <70   . Essential hypertension   . Colon polyps 05/10/2005    Hyperplastic polyps  . Depression   . OSA on CPAP     bipap  . Heart murmur     hx - no notable valvular lesion on Echo  . Cancer     prostate , melanoma head  . Arthritis   . Shingles 07  . Gout   . Complication of anesthesia     "takes long time to wake up"  . Diabetes mellitus type 2 with complications     neuropathy; CAD borderline- no med   Past Surgical History  Procedure Laterality Date  . Prostate palladium gold seed implants (118)  11-15-2002    PROSTATE CANCER  . Excision melanoma from forehead  2010  . Coronary angioplasty with stent placement  09-01-2004      Cypher DES x 2 d-m LCx 2.5 mm x 20 mm & 2.5 mm x 13 mm (~2.75 mm)  . Transthoracic echocardiogram  MAR [id number]    NORMAL EF > 55% / NORMAL FUNCTION WITH IMPAIRED RELAXATION AND MODERATE CONCENTRIC LVH LIKELY HYPERTENSIVE DISEASE  . Hydrocele excision  10/14/2011    Procedure: HYDROCELECTOMY ADULT;  Surgeon: Ailene Rud,  MD;  Location: [place];  Service: Urology;  Laterality: Left;  . Circumsision    . Laminectomy N/A 07/13/2012    Procedure: THORACIC LAMINECTOMY FOR RESECTION OF INTRAMEDULLARY SPINAL CHORD TUMOR WITH SPINAL CHORD MONITORING;  Surgeon: Erline Levine, MD;  Location: [place] [facility]UR[facility];  Service: Neurosurgery;  Laterality: N/A;  Thoracic laminectomy for resection of intramedullary spinal cord tumor with spinal cord monitering and Dr. Christella Noa to  assist  . Nm myoview ltd  [id number]    Lexiscan: LOW RISK, mild inferior bowel artifact; No ischemia or Infarction  . Eye surgery Bilateral     cataracts  . Tonsillectomy    . Laminectomy N/A 12/09/2014    Procedure: Redo Thoracic laminectomy with intramedullary tumar resection/USN/[place]/Subarachnoid shunt/spinal cord monitoring/Dr. Kathyrn Sheriff to assist;  Surgeon: Erline Levine, MD;  Location: [place] [facility];  Service: Neurosurgery;  Laterality: N/A;  Redo Thoracic laminectomy with intramedullary tumar resection/USN/[place]/Subarachnoid shunt/spinal cord monitoring/Dr. Kathyrn Sheriff to assist   Family History  Problem Relation Age of Onset  . Hypertension Mother   . Stroke Mother   . Hypertension Father   . Prostate cancer Father   . Hypertension Brother   . Prostate cancer Brother    Social History:  reports that he has never smoked. He has never used smokeless tobacco. He reports that he drinks alcohol. He reports that he does not use illicit drugs. Allergies:  Allergies  Allergen Reactions  . Betadine [Povidone Iodine] Anaphylaxis  . Contrast Media [Iodinated Diagnostic Agents] Anaphylaxis and Rash    Other Reaction: Other reaction  . Iodine Anaphylaxis    Shellfish, IVP dye. Swelling in throat and foaming at mouth.  . Shellfish Allergy Anaphylaxis   Medications Prior to Admission  Medication Sig Dispense Refill  . allopurinol (ZYLOPRIM) 300 MG tablet Take 300 mg by mouth daily.     Marland Kitchen amLODipine (NORVASC) 10 MG tablet Take 10 mg by mouth daily.     Marland Kitchen aspirin EC 81 MG tablet Take 81 mg by mouth daily.    . clopidogrel (PLAVIX) 75 MG tablet Take 1 tablet by mouth daily.    Marland Kitchen escitalopram (LEXAPRO) 10 MG tablet Take 10 mg by mouth daily.     Marland Kitchen oxybutynin (DITROPAN XL) 15 MG 24 hr tablet Take 15 mg by mouth at bedtime.    . rosuvastatin (CRESTOR) 20 MG tablet Take 20 mg by mouth daily.    . tamsulosin (FLOMAX) 0.4 MG CAPS Take 0.4 mg by mouth at bedtime.    . valsartan (DIOVAN) 80 MG tablet  Take 80 mg by mouth daily.     Marland Kitchen VITAMIN D, CHOLECALCIFEROL, PO Take 1 tablet by mouth daily.       Home: Home Living Family/patient expects to be discharged to:: Private residence Living Arrangements: Spouse/significant other Available Help at Discharge: Available 24 hours/day Type of Home: House Home Access: Stairs to enter Technical brewer of Steps: 3 Entrance Stairs-Rails: Left Home Layout: Multi-level Alternate Level Stairs-Number of Steps: 8 Alternate Level Stairs-Rails: Right Bathroom Shower/Tub: Chiropodist: Handicapped height Bathroom Accessibility: Yes Home Equipment: Civil engineer, contracting, Environmental consultant - 2 wheels Additional Comments: wife's bathroom more accessible   Functional History: Prior Function Level of Independence: Independent  Functional Status:  Mobility: Bed Mobility Overal bed mobility: Needs Assistance Bed Mobility: Sit to Sidelying, Sidelying to Sit, Rolling Rolling: Supervision Sidelying to sit: Supervision Sit to sidelying: Supervision, HOB elevated (vc for sequencing) General bed mobility comments: ataxic LE's but safe Transfers Overall transfer level: Needs assistance  Equipment used: Rolling walker (2 wheeled) Transfers: Sit to/from Stand Sit to Stand: +2 physical assistance, Mod assist Stand pivot transfers: Mod assist, +2 physical assistance General transfer comment: cues for hand placement  Ambulation/Gait Ambulation/Gait assistance: Mod assist, +2 physical assistance Ambulation Distance (Feet): 15 Feet (x2 to/from bathroom and 6' to/from the sink) Assistive device: Rolling walker (2 wheeled) Gait Pattern/deviations: Step-through pattern, Ataxic General Gait Details: Very ataxic gait, R worse than L, worse when not looking Gait velocity interpretation: Below normal speed for age/gender    ADL: ADL Overall ADL's : Needs assistance/impaired Grooming: Set up, Sitting Upper Body Bathing: Set up, Sitting Lower Body Bathing:  Moderate assistance, Sit to/from stand Upper Body Dressing : Set up, Sitting Lower Body Dressing: Moderate assistance, Sit to/from stand Toilet Transfer: +2 for physical assistance, Maximal assistance, RW, Ambulation Toileting- Clothing Manipulation and Hygiene: Moderate assistance, Sit to/from stand Functional mobility during ADLs: +2 for physical assistance, Maximal assistance, Rolling walker, Cueing for safety, Cueing for sequencing (atqaxic gait pattern) General ADL Comments: unsafe and high risk for falls during mobility for ADL. Impulsive at baseline.  Cognition: Cognition Overall Cognitive Status: Within Functional Limits for tasks assessed Orientation Level: Oriented X4 Cognition Arousal/Alertness: Awake/alert Behavior During Therapy: Impulsive Overall Cognitive Status: Within Functional Limits for tasks assessed  Physical Exam: Blood pressure 150/66, pulse 63, temperature 97.7 F (36.5 C), temperature source Oral, resp. rate 18, weight 99.338 kg (219 lb), SpO2 93 %. Physical Exam Constitutional: He is oriented to person, place, and time. He appears well-developed.  HENT: oral mucosa pink and moist Head: Normocephalic.  Eyes: EOM are normal.  Neck: Normal range of motion. Neck supple. No thyromegaly present.  Cardiovascular: Normal rate and regular rhythm. occasional pac Respiratory: Effort normal and breath sounds normal. No respiratory distress. No wheezes or rales GI: Soft. Bowel sounds are normal. He exhibits no distension.  Neurological: He is alert and oriented to person, place, and time.  Decreased LT/PP in bilateral lower extremities from upper thigh and distally. Decreased gross coordination of both legs, right more than left. RLE, mild limb ataxia: 4/5 hf, 3+ke and adf/apf. LLE: 4/5 hf, 4ke and 4-adf/apf. Bilateral UE:  5/5 deltoid, bicep, tricpe, wrist, HI. Marland Kitchen Cognitively he's appropriate with normal insight and awareness Skin:  Incision site is dressed clean and  dry  Psychiatric: He has a normal mood and affect. His behavior is normal    Results for orders placed or performed during the hospital encounter of 12/09/14 (from the past 48 hour(s))  Glucose, capillary     Status: Abnormal   Collection Time: 12/09/14 12:13 PM  Result Value Ref Range   Glucose-Capillary 169 (H) 65 - 99 mg/dL   Comment 1 Notify RN   Glucose, capillary     Status: Abnormal   Collection Time: 12/09/14  4:46 PM  Result Value Ref Range   Glucose-Capillary 212 (H) 65 - 99 mg/dL  Urinalysis, Routine w reflex microscopic (not at [facility])     Status: Abnormal   Collection Time: 12/09/14  6:00 PM  Result Value Ref Range   Color, Urine YELLOW YELLOW   APPearance CLEAR CLEAR   Specific Gravity, Urine 1.018 1.005 - 1.030   pH 5.0 5.0 - 8.0   Glucose, UA 250 (A) NEGATIVE mg/dL   Hgb urine dipstick MODERATE (A) NEGATIVE   Bilirubin Urine NEGATIVE NEGATIVE   Ketones, ur NEGATIVE NEGATIVE mg/dL   Protein, ur NEGATIVE NEGATIVE mg/dL   Urobilinogen, UA 0.2 0.0 - 1.0 mg/dL  Nitrite NEGATIVE NEGATIVE   Leukocytes, UA NEGATIVE NEGATIVE  Urine microscopic-add on     Status: Abnormal   Collection Time: 12/09/14  6:00 PM  Result Value Ref Range   Squamous Epithelial / LPF RARE RARE   WBC, UA 0-2 <3 WBC/hpf   RBC / HPF 0-2 <3 RBC/hpf   Crystals URIC ACID CRYSTALS (A) NEGATIVE   Urine-Other MUCOUS PRESENT   Glucose, capillary     Status: Abnormal   Collection Time: 12/09/14  8:40 PM  Result Value Ref Range   Glucose-Capillary 154 (H) 65 - 99 mg/dL  Glucose, capillary     Status: Abnormal   Collection Time: 12/10/14 12:25 AM  Result Value Ref Range   Glucose-Capillary 162 (H) 65 - 99 mg/dL  Glucose, capillary     Status: Abnormal   Collection Time: 12/10/14  5:03 AM  Result Value Ref Range   Glucose-Capillary 175 (H) 65 - 99 mg/dL  Glucose, capillary     Status: Abnormal   Collection Time: 12/10/14  8:42 AM  Result Value Ref Range   Glucose-Capillary 158 (H) 65 - 99 mg/dL    Glucose, capillary     Status: Abnormal   Collection Time: 12/10/14 12:24 PM  Result Value Ref Range   Glucose-Capillary 194 (H) 65 - 99 mg/dL   Comment 1 Notify RN    Comment 2 Document in Chart   Glucose, capillary     Status: Abnormal   Collection Time: 12/10/14  4:51 PM  Result Value Ref Range   Glucose-Capillary 173 (H) 65 - 99 mg/dL   Comment 1 Notify RN    Comment 2 Document in Chart   Glucose, capillary     Status: Abnormal   Collection Time: 12/10/14  9:27 PM  Result Value Ref Range   Glucose-Capillary 195 (H) 65 - 99 mg/dL  Glucose, capillary     Status: Abnormal   Collection Time: 12/10/14 11:55 PM  Result Value Ref Range   Glucose-Capillary 157 (H) 65 - 99 mg/dL   Comment 1 Notify RN    Comment 2 Document in Chart   Glucose, capillary     Status: Abnormal   Collection Time: 12/11/14  4:01 AM  Result Value Ref Range   Glucose-Capillary 168 (H) 65 - 99 mg/dL   Comment 1 Notify RN    Comment 2 Document in Chart   Glucose, capillary     Status: Abnormal   Collection Time: 12/11/14  7:56 AM  Result Value Ref Range   Glucose-Capillary 217 (H) 65 - 99 mg/dL   [place] Intraoperative  12/09/2014   CLINICAL DATA:    Ultrasound was provided for use by the ordering physician, and a technical  charge was applied by the performing facility.  No radiologist  interpretation/professional services rendered.        Medical Problem List and Plan: 1. 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  -begin CIR therapies  -steroid taper 2.  DVT Prophylaxis/Anticoagulation: SCDs. Check vascular study upon admission given VTE risk. 3. Pain Management: Hydrocodone and Robaxin as needed. Monitor with increased mobility 4. Mood: Lexapro 10 mg daily. Provide emotional support 5. Neuropsych: This patient is capable of making decisions on his own behalf. 6. Skin/Wound Care: Routine skin checks, encourage appropriate  nutrition.  -keep area dry 7. Fluids/Electrolytes/Nutrition: Routine I&O with follow-up chemistries with admit labs 8. Hypertension. Norvasc 10 mg daily, Avapro 75 mg daily. Monitor with increased mobility 9. Constipation/neurogenic bowel: Adjust  bowel program. Has been constipated 10. Hyperlipidemia. Crestor 11. History of gout. Allopurinol 300 mg daily. Monitor for any flareups 12. BPH with history of prostate cancer/hematuria. Flomax 0.4 mg daily, Ditropan 15 mg daily at bedtime. Check PVRs 3. Follow for voiding patterns 13. CAD with stenting. Resume aspirin and Plavix  -therapy to monitor for symptoms with increased exertion    Post Admission Physician Evaluation: 1. Functional deficits secondary  to thoracic ependymoma with myelopathy and paraplegia 2. Patient is admitted to receive collaborative, interdisciplinary care between the physiatrist, rehab nursing staff, and therapy team. 3. Patient's level of medical complexity and substantial therapy needs in context of that medical necessity cannot be provided at a lesser intensity of care such as a SNF. 4. Patient has experienced substantial functional loss from his/her baseline which was documented above under the "Functional History" and "Functional Status" headings.  Judging by the patient's diagnosis, physical exam, and functional history, the patient has potential for functional progress which will result in measurable gains while on inpatient rehab.  These gains will be of substantial and practical use upon discharge  in facilitating mobility and self-care at the household level. 5. Physiatrist will provide 24 hour management of medical needs as well as oversight of the therapy plan/treatment and provide guidance as appropriate regarding the interaction of the two. 6. 24 hour rehab nursing will assist with bladder management, bowel management, safety, skin/wound care, disease management, medication administration, pain management and  patient education  and help integrate therapy concepts, techniques,education, etc. 7. PT will assess and treat for/with: Lower extremity strength, range of motion, stamina, balance, functional mobility, safety, adaptive techniques and equipment, NMR, surgical precautions, pain mgt, family education, patient education.   Goals are: mod I. 8. OT will assess and treat for/with: ADL's, functional mobility, safety, upper extremity strength, adaptive techniques and equipment, NMR, surgical precautions, pain mgt, patient education.   Goals are: mod I. Therapy may proceed with showering this patient. 9. SLP will assess and treat for/with: n/a.  Goals are: n/a. 10. Case Management and Social Worker will assess and treat for psychological issues and discharge planning. 11. Team conference will be held weekly to assess progress toward goals and to determine barriers to discharge. 12. Patient will receive at least 3 hours of therapy per day at least 5 days per week. 13. ELOS: 10-12 days       14. Prognosis:  excellent     Meredith Staggers, MD, [place] [facility] 12/12/2014   12/11/2014

## 2014-12-12 NOTE — Interval H&P Note (Signed)
[name] was admitted today to Inpatient Rehabilitation with the diagnosis of thoracic ependymoma, paraplegia.  The patient's history has been reviewed, patient examined, and there is no change in status.  Patient continues to be appropriate for intensive inpatient rehabilitation.  I have reviewed the patient's chart and labs.  Questions were answered to the patient's satisfaction. The PAPE has been reviewed and assessment remains appropriate.  [name] T 12/12/2014, 4:55 PM

## 2014-12-12 NOTE — Clinical Social Work Note (Signed)
CSW consult acknowledged:  Clinical Education officer, museum received consult for SNF placement. CSW informed that patient will be admitted into CIR today.  Clinical Social Worker will sign off for now as social work intervention is no longer needed. Please consult [place] again if new need arises.  Glendon Axe, MSW, LCSWA [phone] 12/12/2014 11:28 AM

## 2014-12-12 NOTE — Discharge Summary (Signed)
Physician Discharge Summary  Patient ID: [name] MRN: [id number] DOB/AGE: 10/09/1935 79 y.o.  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 Kathyrn Sheriff to assist (N/A) - Redo Thoracic laminectomy with intramedullary tumar resection, microdissection/USN/spinal cord monitoring/Dr. Kathyrn Sheriff to assist  Active Problems:   Back pain   Malignant tumor spinal cord   Discharged Condition: good  Hospital Course: [name] was admitted for surgery with dx intramedullary tumor.  Following uncomplicated resection, he recovered nicely and transferred initially to Neuro ICU followed by 5MW for nursing care and therapies. He is improving, with some proprioception deficit BLE, as expected with dorsal column injury. He continues to improve working with PT.    Consults: rehabilitation medicine  Significant Diagnostic Studies: radiology: X-Ray: intra operative x-ray and untrasound  Treatments: surgery: Redo Thoracic laminectomy with intramedullary tumar resection, microdissection /USN/spinal cord monitoring/Dr. Kathyrn Sheriff to assist (N/A) - Redo Thoracic laminectomy with intramedullary tumar resection, microdissection/USN/spinal cord monitoring/Dr. Kathyrn Sheriff to assist   Discharge Exam: Blood pressure 164/81, pulse 57, temperature 97.8 F (36.6 C), temperature source Oral, resp. rate 18, height 5\' 9"  (1.753 m), weight 102.649 kg (226 lb 4.8 oz), SpO2 92 %. Marked progress with PT late yesterday.  Agreeable with plan for CIR today if possible.  Headaches much improved. Incision flat  without erythema or drainage.    Disposition: Discharge to [place].      Medication List    TAKE these medications        allopurinol 300 MG tablet  Commonly known as:  ZYLOPRIM  Take 300 mg by mouth daily.     amLODipine 10 MG tablet  Commonly known as:  NORVASC  Take 10 mg by mouth daily.     aspirin EC 81 MG tablet  Take 81 mg by mouth daily.     clopidogrel 75 MG tablet  Commonly known as:  PLAVIX  Take 1 tablet by mouth daily.     escitalopram 10 MG tablet  Commonly known as:  LEXAPRO  Take 10 mg by mouth daily.     oxybutynin 15 MG 24 hr tablet  Commonly known as:  DITROPAN XL  Take 15 mg by mouth at bedtime.     rosuvastatin 20 MG tablet  Commonly known as:  CRESTOR  Take 20 mg by mouth daily.     tamsulosin 0.4 MG Caps capsule  Commonly known as:  FLOMAX  Take 0.4 mg by mouth at bedtime.     valsartan 80 MG tablet  Commonly known as:  DIOVAN  Take 80 mg by mouth daily.     VITAMIN D (CHOLECALCIFEROL) PO  Take 1 tablet by mouth daily.         Signed: Verdis Prime 12/12/2014, 10:52 AM

## 2014-12-12 NOTE — Progress Notes (Signed)
Occupational Therapy Treatment Patient Details Name: [name] MRN: [id number] DOB: 1935/12/18 Today's Date: 12/12/2014    History of present illness Patient is a 79 y/o male with history of s/p thoracic laminectomy and resection of intramedullary spinal cord tumor   OT comments  Patient progressing towards OT goals, continue plan of care for now. Pt continues to present with ataxia during transfers and functional mobility. While standing at sink for ADL, pt with right lateral lean and attempting to lean against sink, requiring verbal and tactile cueing to maintain static/dynamic balance with RW. Pt continues to be appropriate for CIR to maximize independence with ADLs and functional mobility so he can safely discharge home with his wife.    Follow Up Recommendations  CIR;Supervision/Assistance - 24 hour    Equipment Recommendations  Tub/shower bench;3 in 1 bedside comode    Recommendations for Other Services Rehab consult    Precautions / Restrictions Precautions Precautions: Fall Precaution Comments: laminectomy, ataxic gait Restrictions Weight Bearing Restrictions: No    Mobility Bed Mobility Overal bed mobility: Needs Assistance Bed Mobility: Rolling;Sidelying to Sit;Supine to Sit Rolling: Supervision Sidelying to sit: Supervision     Sit to sidelying: Supervision General bed mobility comments: use of bed rail and appropriate log roll technique  Transfers Overall transfer level: Needs assistance Equipment used: Rolling walker (2 wheeled) Transfers: Sit to/from Stand Sit to Stand: +2 physical assistance;Mod assist General transfer comment: VCs for hand placement and cues for power up to standing, posterior lean when standing up from EOB.     Balance Overall balance assessment: Needs assistance Sitting-balance support: No upper extremity supported;Feet supported Sitting balance-Leahy Scale: Good     Standing balance support: Bilateral upper extremity  supported;During functional activity Standing balance-Leahy Scale: Poor Standing balance comment: VC's for increased BOS, initially relying on counter for assist, tactile cues for postural alighment and stability; pt with right lateral lean when standing at sink for ADL.    ADL Overall ADL's : Needs assistance/impaired     Grooming: Min guard;Standing;Wash/dry hands;Wash/dry face Lower Body Dressing: Supervision/safety;Sit to/from stand (socks only) Lower Body Dressing Details (indicate cue type and reason): required mod assist (+2 for safety) for sit<>stand for safety and due to ataxic movements    General ADL Comments: Pt able to cross BLEs for LB ADLs. Pt stood at sink for grooming tasks of washing hands and washing face using wash cloth. Pt tended to have right lateral lean in standing and would lean up against counter, cues needed to come to midline and maintain dynamic standing balance with RW.      Cognition   Behavior During Therapy: Impulsive Overall Cognitive Status: Within Functional Limits for tasks assessed                Pertinent Vitals/ Pain       Pain Assessment: No/denies pain         Frequency Min 2X/week     Progress Toward Goals  OT Goals(current goals can now be found in the care plan section)  Progress towards OT goals: Progressing toward goals  Acute Rehab OT Goals Patient Stated Goal: to be able to walk so I can work  Plan Discharge plan remains appropriate    Co-evaluation    PT/OT/SLP Co-Evaluation/Treatment:  (dove-tail with PT)   End of Session Equipment Utilized During Treatment: Gait belt;Rolling walker   Activity Tolerance Patient tolerated treatment well   Patient Left in bed;with call bell/phone within reach;with bed alarm set;with SCD's reapplied  Time: [id number] OT Time Calculation (min): 9 min  Charges: OT General Charges $OT Visit: 1 Procedure OT Treatments $Self Care/Home Management : 8-22 mins  Kem Parcher ,  MS, OTR/L, CLT Pager: [phone]  12/12/2014, 11:16 AM

## 2014-12-12 NOTE — Care Management Important Message (Signed)
Important Message  Patient Details  Name: [name] MRN: [id number] Date of Birth: Nov 09, 1935   Medicare Important Message Given:  Yes-second notification given    Delorse Lek 12/12/2014, 12:01 PM

## 2014-12-12 NOTE — Progress Notes (Signed)
Patient transferred to the 4 MW. Report given to the receiving RN.

## 2014-12-12 NOTE — Progress Notes (Signed)
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 Date: A=01-03-03 and B=10/03/03 Deduct: $1288 Out of Pocket Max: none  Life Max: unlimited CIR: 100% SNF: 100 days Outpatient: 80% Co-Pay: 20% Home Health: 100% Co-Pay: none DME: 80% Co-Pay: 20% Providers: patient's choice  SECONDARY: AARP Policy#: [id number] Subscriber: Ree Shay CM Name: Phone#: Fax#:  Pre-Cert#: Employer: Retired Benefits: Phone #: [phone] Name:  Eff. 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 right handed male with history of  CAD and stenting, prostate cancer 2004 with seed implants as well as resection of thoracic intramedullary tumor April 2014. Patient lives with his wife had been independent until recently. Presented 12/09/2014 with gait abnormality, numbness in his perineum region. Recent MRI imaging thoracic spine showed progression of abnormal signal and enhancement extending T10-T11 ependymoma increased from comparison 03/07/2014. Underwent redo thoracic laminectomy with intramedullary tumor resection 12/09/2014 for Dr. Vertell Limber. Hospital course pain management. Decadron protocol as indicated. Pathology report pending. Bout of hematuria felt to be related to Foley tube irritation and monitor. Physical and occupational therapy evaluations completed 12/10/2014 with recommendations of physical medicine rehabilitation consult. Patient to be admitted for a comprehensive inpatient rehabilitation program.  Past Medical History  Past Medical History  Diagnosis Date  . History of prostate cancer 2004-- S/P SEED IMPLANTS     NO RECURRENCE  . S/P primary angioplasty with coronary stent 2006--     X2 DE STENTS - Cypher 2.5 mm postdilated to 2.75 mm (20 mm and 13 mm stents)  . Urgency of urination   . Frequency of urination   . H/O: gout STABLE  . Impaired hearing RIGHT HEARING AID  . Hydrocele, left   . History of melanoma excision 2010    FOREHEAD  . CAD S/P percutaneous coronary angioplasty CARDIOLOGIST- DR DAVID [facility]    Myoview March 2014: No ischemia or infarction  . Diastolic dysfunction without heart failure     Moderate LVH, Gr 1 DD on Echo 2011; no CHF admissions, no tt on diuretisc  . Hyperlipidemia LDL goal <70   . Essential hypertension   . Colon polyps 05/10/2005    Hyperplastic polyps  . Depression   . OSA on CPAP     bipap  . Heart murmur     hx - no notable valvular lesion on Echo  . Cancer     prostate , melanoma  head  . Arthritis   . Shingles 07  . Gout   . Complication of anesthesia     "takes long time to  wake up"  . Diabetes mellitus type 2 with complications     neuropathy; CAD borderline- no med    Family History  family history includes Hypertension in his brother, father, and mother; Prostate cancer in his brother and father; Stroke in his mother.  Prior Rehab/Hospitalizations:  Has the patient had major surgery during 100 days prior to admission? Yes. Had cataract surgery on both his eyes 2 months ago as an outpatient procedure.  Current Medications   Current facility-administered medications:  . 0.9 % sodium chloride infusion, 250 mL, Intravenous, Continuous, Erline Levine, MD . 0.9 % NaCl with KCl 20 mEq/ L infusion, , Intravenous, Continuous, Erline Levine, MD, Stopped at 12/10/14 0900 . acetaminophen (TYLENOL) tablet 650 mg, 650 mg, Oral, Q4H PRN **OR** acetaminophen (TYLENOL) suppository 650 mg, 650 mg, Rectal, Q4H PRN, Erline Levine, MD . allopurinol (ZYLOPRIM) tablet 300 mg, 300 mg, Oral, Daily, Erline Levine, MD, 300 mg at 12/12/14 0958 . alum & mag hydroxide-simeth (MAALOX/MYLANTA) 200-200-20 MG/5ML suspension 30 mL, 30 mL, Oral, Q6H PRN, Erline Levine, MD . amLODipine (NORVASC) tablet 10 mg, 10 mg, Oral, Daily, Erline Levine, MD, 10 mg at 12/12/14 0958 . aspirin EC tablet 81 mg, 81 mg, Oral, Daily, Erline Levine, MD, 81 mg at 12/12/14 0958 . bisacodyl (DULCOLAX) suppository 10 mg, 10 mg, Rectal, Daily PRN, Erline Levine, MD . dexamethasone (DECADRON) injection 4 mg, 4 mg, Intravenous, 4 times per day, 4 mg at 12/10/14 0504 **OR** dexamethasone (DECADRON) tablet 4 mg, 4 mg, Oral, 4 times per day, Erline Levine, MD, 4 mg at 12/12/14 0552 . dextrose 5 % and 0.45 % NaCl with KCl 20 mEq/L infusion, , Intravenous, Continuous, Erline Levine, MD . docusate sodium (COLACE) capsule 100 mg, 100 mg, Oral, BID, Erline Levine, MD, 100 mg at 12/12/14 0958 .  escitalopram (LEXAPRO) tablet 10 mg, 10 mg, Oral, Daily, Erline Levine, MD, 10 mg at 12/12/14 0958 . HYDROcodone-acetaminophen (NORCO/VICODIN) 5-325 MG per tablet 1-2 tablet, 1-2 tablet, Oral, Q4H PRN, Erline Levine, MD, 2 tablet at 12/09/14 1433 . HYDROmorphone (DILAUDID) injection 0.5-1 mg, 0.5-1 mg, Intravenous, Q2H PRN, Erline Levine, MD, 1 mg at 12/09/14 1456 . insulin aspart (novoLOG) injection 0-15 Units, 0-15 Units, Subcutaneous, 6 times per day, Erline Levine, MD, 3 Units at 12/12/14 0800 . irbesartan (AVAPRO) tablet 75 mg, 75 mg, Oral, Daily, Erline Levine, MD, 75 mg at 12/12/14 0957 . menthol-cetylpyridinium (CEPACOL) lozenge 3 mg, 1 lozenge, Oral, PRN **OR** phenol (CHLORASEPTIC) mouth spray 1 spray, 1 spray, Mouth/Throat, PRN, Erline Levine, MD . methocarbamol (ROBAXIN) tablet 500 mg, 500 mg, Oral, Q6H PRN **OR** methocarbamol (ROBAXIN) 500 mg in dextrose 5 % 50 mL IVPB, 500 mg, Intravenous, Q6H PRN, Erline Levine, MD . ondansetron [facility]) injection 4 mg, 4 mg, Intravenous, Q4H PRN, Erline Levine, MD . oxybutynin (DITROPAN XL) 24 hr tablet 15 mg, 15 mg, Oral, QHS, Erline Levine, MD, 15 mg at 12/11/14 2150 . oxyCODONE-acetaminophen (PERCOCET/ROXICET) 5-325 MG per tablet 1-2 tablet, 1-2 tablet, Oral, Q4H PRN, Erline Levine, MD, 2 tablet at 12/09/14 2043 . pantoprazole (PROTONIX) EC tablet 40 mg, 40 mg, Oral, QHS, Erline Levine, MD, 40 mg at 12/11/14 2147 . polyethylene glycol (MIRALAX / GLYCOLAX) packet 17 g, 17 g, Oral, Daily PRN, Erline Levine, MD, 17 g at 12/11/14 1129 . rosuvastatin (CRESTOR) tablet 20 mg, 20 mg, Oral, Daily, Erline Levine, MD, 20 mg at 12/12/14 1004 . sodium chloride 0.9 % injection 3 mL, 3 mL, Intravenous, Q12H, Erline Levine, MD, 3 mL at 12/12/14 0959 .  sodium chloride 0.9 % injection 3 mL, 3 mL, Intravenous, PRN, Erline Levine, MD . sodium phosphate (FLEET) 7-19 GM/118ML enema 1 enema, 1 enema, Rectal, Once PRN, Erline Levine, MD . tamsulosin [facility]) capsule 0.4  mg, 0.4 mg, Oral, QHS, Erline Levine, MD, 0.4 mg at 12/11/14 2147  Patients Current Diet: Diet Carb Modified Fluid consistency:: Thin; Room service appropriate?: Yes  Precautions / Restrictions Precautions Precautions: Fall Precaution Comments: laminectomy, ataxic gait Restrictions Weight Bearing Restrictions: No   Has the patient had 2 or more falls or a fall with injury in the past year?No. However, he did have one fall 6-8 months ago with no injury.  Prior Activity Level Community (5-7x/wk): Went out 3-4 X a week. 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  Home Assistive Devices / Equipment Home Assistive Devices/Equipment: Eyeglasses Home Equipment: Shower seat, Environmental consultant - 2 wheels  Prior Device Use: Indicate devices/aids used by the patient prior to current illness, exacerbation or injury? None  Prior Functional Level Prior Function Level of Independence: Independent  Self Care: Did the patient need help bathing, dressing, using the toilet or eating? Independent  Indoor Mobility: Did the patient need assistance with walking from room to room (with or without device)? Independent  Stairs: Did the patient need assistance with internal or external stairs (with or without device)? Independent  Functional Cognition: Did the patient need help planning regular tasks such as shopping or remembering to take medications? Independent. Patient says wife cooks and he cleans.  Current Functional Level Cognition  Overall Cognitive Status: Within Functional Limits for tasks assessed Orientation Level: Oriented X4   Extremity Assessment (includes Sensation/Coordination)  Upper Extremity Assessment: Defer to OT evaluation  Lower Extremity Assessment: RLE deficits/detail, LLE deficits/detail RLE Deficits / Details: strength WFL. poor proprioception. decreased sensation. ataxic Appears more impaired than L RLE Sensation: decreased light touch, decreased proprioception RLE  Coordination: decreased gross motor, decreased fine motor LLE Deficits / Details: strength WFL. poor sensation and proprioception. ataxic LLE Sensation: decreased light touch, decreased proprioception LLE Coordination: decreased gross motor, decreased fine motor    ADLs  Overall ADL's : Needs assistance/impaired Grooming: Min guard, Standing, Wash/dry hands, Wash/dry face Upper Body Bathing: Set up, Sitting Lower Body Bathing: Moderate assistance, Sit to/from stand Upper Body Dressing : Set up, Sitting Lower Body Dressing: Supervision/safety, Sit to/from stand (socks only) Lower Body Dressing Details (indicate cue type and reason): required mod assist (+2 for safety) for sit<>stand for safety and due to ataxic movements  Toilet Transfer: +2 for physical assistance, Maximal assistance, RW, Ambulation Toileting- Clothing Manipulation and Hygiene: Moderate assistance, Sit to/from stand Functional mobility during ADLs: +2 for physical assistance, Maximal assistance, Rolling walker, Cueing for safety, Cueing for sequencing (atqaxic gait pattern) General ADL Comments: Pt able to cross BLEs for LB ADLs. Pt stood at sink for grooming tasks of washing hands and washing face using wash cloth. Pt tended to have right lateral lean in standing and would lean up against counter, cues needed to come to midline and maintain dynamic standing balance with RW.     Mobility  Overal bed mobility: Needs Assistance Bed Mobility: Rolling, Sidelying to Sit, Supine to Sit Rolling: Supervision Sidelying to sit: Supervision Sit to sidelying: Supervision General bed mobility comments: use of bed rail and appropriate log roll technique    Transfers  Overall transfer level: Needs assistance Equipment used: Rolling walker (2 wheeled) Transfers: Sit to/from Stand Sit to Stand: +2 physical assistance, Mod assist Stand pivot transfers: Mod assist, +  2 physical assistance General transfer comment: VCs for hand  placement and cues for power up to standing     Ambulation / Gait / Stairs / Wheelchair Mobility  Ambulation/Gait Ambulation/Gait assistance: Mod assist, +2 physical assistance Ambulation Distance (Feet): 120 Feet (multiple stopping breaks for education ) Assistive device: Rolling walker (2 wheeled) Gait Pattern/deviations: Step-through pattern, Decreased stride length, Drifts right/left, Ataxic General Gait Details: assist for safety due to impulsive and poor proprioception of right LE, lateral LOB to right, increased Physical assist during gait re-training sequencing with good carry over. utilized visual cues for placement of RLE with tactile cues for postural faciliation. Gait velocity: decreased Gait velocity interpretation: Below normal speed for age/gender    Posture / Balance Balance Overall balance assessment: Needs assistance Sitting-balance support: Feet supported, No upper extremity supported Sitting balance-Leahy Scale: Good Standing balance support: Bilateral upper extremity supported Standing balance-Leahy Scale: Poor Standing balance comment: VCs for increased BOS, initially relying on counter for assist, tactile cues for postural alignment and stabilty    Special needs/care consideration BiPAP/CPAP Yes, CPAP CPM NO Continuous Drip IV No Dialysis No  Life Vest No Oxygen No Special Bed No Trach Size No Wound Vac (area) No  Skin Lumbar incision with dressing  Bowel mgmt: Last BM 12/07/14 Bladder mgmt: Voiding in the urinal Diabetic mgmt: New diabetic, now on insulin    Previous Home Environment Living Arrangements: Spouse/significant other Available Help at Discharge: Available 24 hours/day Type of Home: House Home Layout: Multi-level Alternate Level Stairs-Rails: Right Alternate Level Stairs-Number of Steps: 8 Home Access: Stairs to enter Entrance Stairs-Rails: Left Entrance Stairs-Number of Steps:  3 Bathroom Shower/Tub: Chiropodist: Handicapped height Bathroom Accessibility: Yes How Accessible: Accessible via walker Home Care Services: No Additional Comments: wife's bathroom more accessible  Discharge Living Setting Plans for Discharge Living Setting: Patient's home, House, Lives with (comment) (Lives with wife.) Type of Home at Discharge: House Discharge Home Layout: Multi-level. No bathroom on main level. Two full baths upstairs and 1 full bath downstairs.  Alternate Level Stairs-Rails: Right Alternate Level Stairs-Number of Steps: 8 Discharge Home Access: Stairs to enter Entrance Stairs-Rails: Left Entrance Stairs-Number of Steps: 3 Discharge Bathroom Shower/Tub: Tub/shower unit Discharge Bathroom Toilet: Handicapped height Does the patient have any problems obtaining your medications?: No  Social/Family/Support Systems Patient Roles: Spouse, Parent (Has a wife and children who are grown.) Contact Information: Adron Cury - wife Anticipated Caregiver: wife and self Anticipated Caregiver's Contact Information: Archie Patten - wife [phone] Ability/Limitations of Caregiver: Wife does not work and can Veterinary surgeon Availability: 24/7 Discharge Plan Discussed with Primary Caregiver: Yes Is Caregiver In Agreement with Plan?: Yes Does Caregiver/Family have Issues with Lodging/Transportation while Pt is in Rehab?: No  Goals/Additional Needs Patient/Family Goal for Rehab: PT/OT mod I goals Expected length of stay: 10-14 days Cultural Considerations: Jewish, raised in [place] home Dietary Needs: Carb mod, med calorie, thin liquids Equipment Needs: TBD Pt/Family Agrees to Admission and willing to participate: Yes Program Orientation Provided & Reviewed with Pt/Caregiver Including Roles & Responsibilities: Yes  Decrease burden of Care through IP rehab admission: N/A  Possible need for SNF placement upon discharge: Not anticipated  Patient  Condition: This patient's condition remains as documented in the consult dated 12/11/14, in which the Rehabilitation Physician determined and documented that the patient's condition is appropriate for intensive rehabilitative care in an inpatient rehabilitation facility. Will admit to inpatient rehab today.  Preadmission Screen Completed By: [name], 12/12/2014 11:12 AM ______________________________________________________________________  Discussed status with Dr. Naaman Plummer on 12/12/14 at 1108 and received telephone approval for admission today.  Admission Coordinator: [name], time1108/Date09/09/16          Cosigned by: Meredith Staggers, MD at 12/12/2014 1:48 PM  Revision History     Date/Time User Provider Type Action   12/12/2014 1:48 PM Meredith Staggers, MD Physician Cosign   12/12/2014 11:16 AM Charles Diones, RN Rehab Admission Coordinator Sign

## 2014-12-12 NOTE — Progress Notes (Signed)
Rehab admissions - I met with patient.  He would like inpatient rehab admission prior to home with wife.  Bed available and can admit to acute inpatient rehab today.  Call me for questions.  #[phone]

## 2014-12-12 NOTE — Progress Notes (Signed)
Patient arrived to  38M 06 from 70M 06 via bed. Patient and patient's wife given admission packet, and all questions answered. Reviewed safety plan and safety agreement with patient, he verbalized understanding. Noted last bowel movement to be 9/4. Patient given a suppository, will continue to monitor. [place]

## 2014-12-12 NOTE — Progress Notes (Signed)
Patient places himself on CPAP for the night. Using home mask and our machine.

## 2014-12-13 ENCOUNTER — Inpatient Hospital Stay (HOSPITAL_COMMUNITY): Payer: Medicare Other | Admitting: Occupational Therapy

## 2014-12-13 ENCOUNTER — Inpatient Hospital Stay (HOSPITAL_COMMUNITY): Payer: Medicare Other | Admitting: Physical Therapy

## 2014-12-13 LAB — GLUCOSE, CAPILLARY
Glucose-Capillary: 224 mg/dL — ABNORMAL HIGH (ref 65–99)
Glucose-Capillary: 224 mg/dL — ABNORMAL HIGH (ref 65–99)
Glucose-Capillary: 199 mg/dL — ABNORMAL HIGH (ref 65–99)
Glucose-Capillary: 255 mg/dL — ABNORMAL HIGH (ref 65–99)

## 2014-12-13 NOTE — Progress Notes (Signed)
Pt stated that he would self administer CPAP when ready for bed.  Current settings are 8 CMH20 per Pt home setting.  Pt to notify RT if any assistance is required throughout the night.  RT to monitor and assess as needed.

## 2014-12-13 NOTE — Progress Notes (Signed)
[name] is a 79 y.o. male 1935/12/17 [id number]  Subjective: No new complaints. No new problems. Slept well. Feeling OK.  Objective: Vital signs in last 24 hours: Temp:  [97.8 F (36.6 C)-98.1 F (36.7 C)] 97.9 F (36.6 C) (09/10 0621) Pulse Rate:  [58-62] 62 (09/10 0621) Resp:  [17-20] 17 (09/10 0621) BP: (150-154)/(66-74) 150/74 mmHg (09/10 0621) SpO2:  [92 %-95 %] 94 % (09/10 0621) Weight:  [224 lb 10.4 oz (101.9 kg)] 224 lb 10.4 oz (101.9 kg) (09/09 1616) Weight change:  Last BM Date: 12/12/14  Intake/Output from previous day: 09/09 0701 - 09/10 0700 In: 360 [P.O.:360] Out: 425 [Urine:425] Last cbgs: CBG (last 3)   Recent Labs  12/12/14 1550 12/12/14 2145 12/13/14 0706  GLUCAP 262* 209* 224*     Physical Exam General: No apparent distress - eating breakfast  HEENT: not dry Lungs: Normal effort. Lungs clear to auscultation, no crackles or wheezes. Cardiovascular: Regular rate and rhythm, no edema Abdomen: S/NT/ND; BS(+) Musculoskeletal:  unchanged Neurological: No new neurological deficits Wounds: clean Skin: clear  Aging changes Mental state: Alert, oriented, cooperative, talkative    Lab Results: BMET    Component Value Date/Time   NA 138 12/01/2014 1023   K 3.7 12/01/2014 1023   CL 106 12/01/2014 1023   CO2 26 12/01/2014 1023   GLUCOSE 171* 12/01/2014 1023   BUN 20 12/01/2014 1023   CREATININE 1.09 12/01/2014 1023   CREATININE 0.92 04/16/2013 0959   CALCIUM 8.9 12/01/2014 1023   GFRNONAA >60 12/01/2014 1023   GFRAA >60 12/01/2014 1023   CBC    Component Value Date/Time   WBC 7.8 12/01/2014 1023   RBC 5.02 12/01/2014 1023   HGB 15.0 12/01/2014 1023   HCT 42.6 12/01/2014 1023   PLT 169 12/01/2014 1023   MCV 84.9 12/01/2014 1023   MCH 29.9 12/01/2014 1023   MCHC 35.2 12/01/2014 1023   RDW 13.9 12/01/2014 1023   LYMPHSABS 1.3 03/13/2012 0100   MONOABS 1.1* 03/13/2012 0100   EOSABS 0.1 03/13/2012 0100   BASOSABS 0.0 03/13/2012  0100    Studies/Results: Mr Thoracic Spine W Wo Contrast  12/11/2014   CLINICAL DATA:  Status post surgery for T10-11 spinal cord tumor. History of ependymoma resection.  EXAM: MRI THORACIC SPINE WITHOUT AND WITH CONTRAST  TECHNIQUE: Multiplanar and multiecho pulse sequences of the thoracic spine were obtained without and with intravenous contrast.  CONTRAST:  51mL MULTIHANCE GADOBENATE DIMEGLUMINE 529 MG/ML IV SOLN  COMPARISON:  08/08/2014  FINDINGS: Status post redo laminotomy from the lower T9 to upper T12 for treatment of ependymoma. The large majority of enhancing tumor seen previously is no longer visible. Additionally, a focal syrinx from T9-T11 has been partially decompressed, now 5 mm in maximal diameter, previously 7 mm. Punctate enhancement present along the superficial dorsal cord bilaterally along the presumed myelopathy defect, indeterminate between minimal residual tumor and postoperative enhancement. Susceptibility artifact within the incision and at the level of the lower syrinx, expected postoperatively. Incisional and laminectomy defect fluid with mild mass effect on the dorsal dura, but no significant mass effect on the cord.  Small layering pleural effusions.  Diffuse spondylosis and facet arthropathy. Notable T5-6 left foraminal, T10-11 left foraminal, and T2-3 right foraminal stenosis from facet overgrowth.  IMPRESSION: 1. Recent resection of presumed lower cord ependymoma recurrence. Minimal enhancement along the dorsal cord, at the presumed myelotomy defect, which could be postoperative or minimal residual tumor. Lower cord cyst has been partially decompressed. 2. Expected  incisional fluid with mild dural mass effect at the laminectomy.   Electronically Signed   By: Monte Fantasia M.D.   On: 12/11/2014 11:08    Medications: I have reviewed the patient's current medications.  Assessment/Plan:  1. 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 -begin CIR therapies -steroid taper 2. DVT Prophylaxis/Anticoagulation: SCDs. Check vascular study upon admission given VTE risk. 3. Pain Management: Hydrocodone and Robaxin as needed. Monitor with increased mobility 4. Mood: Lexapro 10 mg daily. Provide emotional support 5. Neuropsych: This patient is capable of making decisions on his own behalf. 6. Skin/Wound Care: Routine skin checks, encourage appropriate nutrition. -keep area dry 7. Fluids/Electrolytes/Nutrition: Routine I&O with follow-up chemistries with admit labs 8. Hypertension. Norvasc 10 mg daily, Avapro 75 mg daily. Monitor with increased mobility 9. Constipation/neurogenic bowel: Adjust bowel program. Has been constipated 10. Hyperlipidemia. Crestor 11. History of gout. Allopurinol 300 mg daily. Monitor for any flareups 12. BPH with history of prostate cancer/hematuria. Flomax 0.4 mg daily, Ditropan 15 mg daily at bedtime. Check PVRs 3. Follow for voiding patterns 13. CAD with stenting. Resumed aspirin and Plavix -therapy to monitor for symptoms with increased exertion    Length of stay, days: 1  Walker Kehr , MD 12/13/2014, 9:26 AM

## 2014-12-13 NOTE — Progress Notes (Signed)
Physical Therapy Session Note  Patient Details  Name: [name] MRN: [id number] Date of Birth: [birth year]  Today's Date: 12/13/2014 PT Individual Time: [id number] PT Individual Time Calculation (min): 45 min   Short Term Goals: Week 1:  PT Short Term Goal 1 (Week 1): Pt will be mod I with rolling and transfers supine to edge of bed, edge of bed to supine.  PT Short Term Goal 2 (Week 1): Pt will transfer stand pivot with/without rolling walker and min A. PT Short Term Goal 3 (Week 1): Pt will ambulate with LRAD about 150 feet with min A.  PT Short Term Goal 4 (Week 1): Pt will ascend/descend 12 stairs with B rails and min A.  PT Short Term Goal 5 (Week 1): Pt will propel w/c about 150 feet with mod I.   Skilled Therapeutic Interventions/Progress Updates:  Pt was seen bedside in the pm. Pt propelled w/c to gym about 150 feet with S and verbal cues. Initially placed 2 lbs on R ankle and 7 lbs on L ankle increased to 11 lbs on L ankle with improved gait and decreased assistance. Pt ambulated distances of 140, 140 and 180 feet with rolling walker and min A. Pt ambulated about 50 feet with rolling walker and min to mod A without ankle weights. Pt demonstrated improved control with ankle weights. Pt ascended/descended 12 stairs with B rails and min to mod A. Pt performed cone taps for NMR, 3 sets x 10 reps each. Pt propelled w/c back to room initially with LE and then UEs, requiring S and verbal cues.   Therapy Documentation Precautions:  Precautions Precautions: Fall Precaution Comments: laminectomy, ataxia Restrictions Weight Bearing Restrictions: No General:   Pain: Pain Assessment Pain Assessment: No/denies pain   See Function Navigator for Current Functional Status.   Therapy/Group: Individual Therapy  Dub Amis 12/13/2014, 3:56 PM

## 2014-12-13 NOTE — Evaluation (Signed)
Physical Therapy Assessment and Plan  Patient Details  Name: [name] MRN: [id number] Date of Birth: 1935/07/26  PT Diagnosis: Ataxia, Ataxic gait and Impaired sensation Rehab Potential: Good ELOS: 12 to 14 days   Today's Date: 12/13/2014 PT Individual Time: 0900-1000 PT Individual Time Calculation (min): 60 min    Problem List:  Patient Active Problem List   Diagnosis Date Noted  . Thoracic spine tumor 12/12/2014  . Neurogenic bowel 12/12/2014  . Incomplete paraplegia 12/12/2014  . Back pain 12/09/2014  . Malignant tumor spinal cord 12/09/2014  . Pre-operative cardiovascular examination 09/17/2014  . Other fatigue 09/17/2014  . Chest pain with moderate risk for cardiac etiology 09/17/2014  . Ependymoma of spinal cord 11/01/2013  . Unspecified hereditary and idiopathic peripheral neuropathy 11/01/2013  . Diastolic dysfunction without heart failure   . Obesity (BMI 30-39.9) 03/22/2013  . OSA on CPAP 03/19/2012  . H/O prostate cancer 03/19/2012  . Essential hypertension 03/19/2012  . Hyperlipidemia LDL goal <70 03/19/2012  . H/O hydrocele 03/19/2012  . Hx of melanoma excision 03/19/2012  . Personal history of colonic polyps 03/19/2012  . CAD S/P percutaneous coronary angioplasty: Cypher DES x2 to circumflex 08/17/2004    Past Medical History:  Past Medical History  Diagnosis Date  . History of prostate cancer 2004--  S/P SEED IMPLANTS     NO RECURRENCE  . S/P primary angioplasty with coronary stent 2006--      X2 DE STENTS - Cypher 2.5 mm postdilated to 2.75 mm (20 mm and 13 mm stents)  . Urgency of urination   . Frequency of urination   . H/O: gout STABLE  . Impaired hearing RIGHT HEARING AID  . Hydrocele, left   . History of melanoma excision 2010    FOREHEAD  . CAD S/P percutaneous coronary angioplasty CARDIOLOGIST-  DR DAVID [facility]    Myoview March 2014: No ischemia or infarction  . Diastolic dysfunction without heart failure     Moderate LVH, Gr 1 DD  on Echo 2011; no CHF admissions, no tt on diuretisc  . Hyperlipidemia LDL goal <70   . Essential hypertension   . Colon polyps 05/10/2005    Hyperplastic polyps  . Depression   . OSA on CPAP     bipap  . Heart murmur     hx - no notable valvular lesion on Echo  . Cancer     prostate , melanoma head  . Arthritis   . Shingles 07  . Gout   . Complication of anesthesia     "takes long time to wake up"  . Diabetes mellitus type 2 with complications     neuropathy; CAD borderline- no med   Past Surgical History:  Past Surgical History  Procedure Laterality Date  . Prostate palladium gold seed implants (118)  11-15-2002    PROSTATE CANCER  . Excision melanoma from forehead  2010  . Coronary angioplasty with stent placement  09-01-2004      Cypher DES x 2 d-m LCx 2.5 mm x 20 mm & 2.5 mm x 13 mm (~2.75 mm)  . Transthoracic echocardiogram  MAR 34,7425    NORMAL EF > 55% / NORMAL FUNCTION WITH IMPAIRED RELAXATION AND MODERATE CONCENTRIC LVH LIKELY HYPERTENSIVE DISEASE  . Hydrocele excision  10/14/2011    Procedure: HYDROCELECTOMY ADULT;  Surgeon: Ailene Rud, MD;  Location: [facility];  Service: Urology;  Laterality: Left;  . Circumsision    . Laminectomy N/A 07/13/2012  Procedure: THORACIC LAMINECTOMY FOR RESECTION OF INTRAMEDULLARY SPINAL CHORD TUMOR WITH SPINAL CHORD MONITORING;  Surgeon: Erline Levine, MD;  Location: [place] [facility];  Service: Neurosurgery;  Laterality: N/A;  Thoracic laminectomy for resection of intramedullary spinal cord tumor with spinal cord monitering and Dr. Christella Noa to assist  . Nm myoview ltd  [id number]    Lexiscan: LOW RISK, mild inferior bowel artifact; No ischemia or Infarction  . Eye surgery Bilateral     cataracts  . Tonsillectomy    . Laminectomy N/A 12/09/2014    Procedure: Redo Thoracic laminectomy with intramedullary tumar resection/USN/[place]/Subarachnoid shunt/spinal cord monitoring/Dr. Kathyrn Sheriff to assist;  Surgeon: Erline Levine,  MD;  Location: [place] [facility];  Service: Neurosurgery;  Laterality: N/A;  Redo Thoracic laminectomy with intramedullary tumar resection/USN/[place]/Subarachnoid shunt/spinal cord monitoring/Dr. Kathyrn Sheriff to assist    Assessment & Plan Clinical Impression: Patient is a 79 y.o. 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 Patient lives with his wife had been independent until recently. Presented 12/09/2014 with gait abnormality, numbness in his perineum region. Recent MRI imaging thoracic spine showed progression of abnormal signal and enhancement extending T10-T11 ependymoma increased from comparison 03/07/2014. Underwent redo thoracic laminectomy with intramedullary tumor resection 12/09/2014 for Dr. Vertell Limber. Hospital course pain management. Decadron protocol as indicated. Pathology report pending. Bout of hematuria felt to be related to Foley tube irritation and monitor. Physical and occupational therapy evaluations completed 12/10/2014 with recommendations of physical medicine rehabilitation consult.   Patient transferred to CIR on 12/12/2014 .   Patient currently requires mod with mobility secondary to decreased cardiorespiratoy endurance and ataxia.  Prior to hospitalization, patient was independent  with mobility and lived with Spouse in a House home.  Home access is 3 (back entrance)Stairs to enter.  Patient will benefit from skilled PT intervention to maximize safe functional mobility, minimize fall risk and decrease caregiver burden for planned discharge home with 24 hour supervision.  Anticipate patient will benefit from follow up [place] at discharge.  PT - End of Session Activity Tolerance: Tolerates 30+ min activity with multiple rests Endurance Deficit: Yes PT Assessment Rehab Potential (ACUTE/IP ONLY): Good Barriers to Discharge: Inaccessible  home environment PT Patient demonstrates impairments in the following area(s): Balance;Endurance;Motor PT Transfers Functional Problem(s): Bed Mobility;Bed to Chair;Car PT Locomotion Functional Problem(s): Ambulation;Stairs;Wheelchair Mobility PT Plan PT Intensity: Minimum of 1-2 x/day ,45 to 90 minutes PT Frequency: 5 out of 7 days PT Duration Estimated Length of Stay: 12 to 14 days PT Treatment/Interventions: Ambulation/gait training;Discharge planning;Cognitive remediation/compensation;Balance/vestibular training;DME/adaptive equipment instruction;Functional mobility training;Patient/family education;Neuromuscular re-education;Psychosocial support;Splinting/orthotics;Therapeutic Exercise;Therapeutic Activities;Stair training;UE/LE Strength taining/ROM;UE/LE Coordination activities;Wheelchair propulsion/positioning PT Transfers Anticipated Outcome(s): mod I transfers PT Locomotion Anticipated Outcome(s): S/mod I ambulation, mod I w/c mobility, S for stairs PT Recommendation Follow Up Recommendations: Home health PT Patient destination: Home Equipment Recommended: To be determined  Skilled Therapeutic Intervention PT evaluation completed and treatment plan initiated. Performed LAQs with 7 lbs on R LE and 6 lbs on L LE for strengthening and to increased proprioceptive input. Performed multiple sit to stand transfers with mod A and verbal cues. Performed ambulation and stairs with LE weights with improved LE control. Pt propelled w/c about 150 feet with B UEs and S. Pt returned to room following treatment and left sitting up in w/c with call bell within reach.   PT Evaluation Precautions/Restrictions Precautions Precautions: Fall Precaution Comments: laminectomy,  ataxia Restrictions Weight Bearing Restrictions: No General Chart Reviewed: Yes Family/Caregiver Present: No Vital Signs Pain No c/o pain.   Home Living/Prior Functioning Home Living Available Help at Discharge: Available 24  hours/day Type of Home: House Home Access: Stairs to enter [employer] of Steps: 3 (back entrance) Entrance Stairs-Rails: Right;Left;Can reach both Home Layout: Multi-level;Bed/bath upstairs (no bed or bath on main level) Alternate Level Stairs-Number of Steps: 8 Alternate Level Stairs-Rails: Right  Lives With: Spouse Prior Function Level of Independence: Independent with gait;Independent with transfers  Able to Take Stairs?: Yes Driving: Yes Vocation: Full time employment Vision/Perception  As per OT evaluation.  Cognition Overall Cognitive Status: Within Functional Limits for tasks assessed Arousal/Alertness: Awake/alert Orientation Level: Oriented X4 Memory: Impaired Memory Impairment: Decreased recall of new information Awareness: Appears intact Behaviors: Impulsive Safety/Judgment: Appears intact Sensation Sensation Light Touch: Impaired Detail Light Touch Impaired Details: Absent RLE;Absent LLE Proprioception: Impaired Detail Proprioception Impaired Details: Absent RLE;Absent LLE Additional Comments:  (light absent/impaired below belly button) Coordination Gross Motor Movements are Fluid and Coordinated: No Fine Motor Movements are Fluid and Coordinated: No Heel Shin Test: impaired R greater than L Motor  Motor Motor: Ataxia Motor - Skilled Clinical Observations: Ataxia R worse than L  Mobility: Rolling R, Rolling L, supine to edge of bed, edge of bed to supine S with head of bed flat and no rails, log rolling TRANSFERS:  Sit to stand, stand to sit, stand pivot without assistive device mod A and verbal cues.  Locomotion:  Ambulation: 50 feet with hand rail and mod A with verbal cues. Stairs: ascend/descend 4 stairs with B rails and mod A W/c mobility: 150 feet with B UEs and S.   Trunk/Postural Assessment  Cervical Assessment Cervical Assessment: Within Functional Limits Thoracic Assessment Thoracic Assessment: Within Functional Limits Lumbar  Assessment Lumbar Assessment: Within Functional Limits Postural Control Postural Control: Within Functional Limits  Balance Balance Balance Assessed: Yes Static Sitting Balance Static Sitting - Balance Support: Feet supported Static Sitting - Level of Assistance: 6: Modified independent (Device/Increase time) Static Standing Balance Static Standing - Balance Support: During functional activity Static Standing - Level of Assistance: 3: Mod assist Dynamic Standing Balance Dynamic Standing - Balance Support: During functional activity Dynamic Standing - Level of Assistance: 3: Mod assist Extremity Assessment  B UEs as per OT evaluation.    RLE Assessment RLE Assessment: Within Functional Limits LLE Assessment LLE Assessment: Within Functional Limits   See Function Navigator for Current Functional Status.   Refer to Care Plan for Long Term Goals  Recommendations for other services: None  Discharge Criteria: Patient will be discharged from PT if patient refuses treatment 3 consecutive times without medical reason, if treatment goals not met, if there is a change in medical status, if patient makes no progress towards goals or if patient is discharged from hospital.  The above assessment, treatment plan, treatment alternatives and goals were discussed and mutually agreed upon: by patient  Dub Amis 12/13/2014, 10:24 AM

## 2014-12-13 NOTE — Evaluation (Signed)
Occupational Therapy Assessment and Plan  Patient Details  Name: [name] MRN: [id number] Date of Birth: 05/14/1935  OT Diagnosis: ataxia and impaired LE sensation/proprioception Rehab Potential: Rehab Potential (ACUTE ONLY): Good ELOS: 12-14 days   Today's Date: 12/13/2014 OT Individual Time: 1030-1130 and 1430-1500 OT Individual Time Calculation (min): 60 min and 30 min  Problem List:  Patient Active Problem List   Diagnosis Date Noted  . Thoracic spine tumor 12/12/2014  . Neurogenic bowel 12/12/2014  . Incomplete paraplegia 12/12/2014  . Back pain 12/09/2014  . Malignant tumor spinal cord 12/09/2014  . Pre-operative cardiovascular examination 09/17/2014  . Other fatigue 09/17/2014  . Chest pain with moderate risk for cardiac etiology 09/17/2014  . Ependymoma of spinal cord 11/01/2013  . Unspecified hereditary and idiopathic peripheral neuropathy 11/01/2013  . Diastolic dysfunction without heart failure   . Obesity (BMI 30-39.9) 03/22/2013  . OSA on CPAP 03/19/2012  . H/O prostate cancer 03/19/2012  . Essential hypertension 03/19/2012  . Hyperlipidemia LDL goal <70 03/19/2012  . H/O hydrocele 03/19/2012  . Hx of melanoma excision 03/19/2012  . Personal history of colonic polyps 03/19/2012  . CAD S/P percutaneous coronary angioplasty: Cypher DES x2 to circumflex 08/17/2004    Past Medical History:  Past Medical History  Diagnosis Date  . History of prostate cancer 2004--  S/P SEED IMPLANTS     NO RECURRENCE  . S/P primary angioplasty with coronary stent 2006--      X2 DE STENTS - Cypher 2.5 mm postdilated to 2.75 mm (20 mm and 13 mm stents)  . Urgency of urination   . Frequency of urination   . H/O: gout STABLE  . Impaired hearing RIGHT HEARING AID  . Hydrocele, left   . History of melanoma excision 2010    FOREHEAD  . CAD S/P percutaneous coronary angioplasty CARDIOLOGIST-  DR DAVID [facility]    Myoview March 2014: No ischemia or infarction  . Diastolic  dysfunction without heart failure     Moderate LVH, Gr 1 DD on Echo 2011; no CHF admissions, no tt on diuretisc  . Hyperlipidemia LDL goal <70   . Essential hypertension   . Colon polyps 05/10/2005    Hyperplastic polyps  . Depression   . OSA on CPAP     bipap  . Heart murmur     hx - no notable valvular lesion on Echo  . Cancer     prostate , melanoma head  . Arthritis   . Shingles 07  . Gout   . Complication of anesthesia     "takes long time to wake up"  . Diabetes mellitus type 2 with complications     neuropathy; CAD borderline- no med   Past Surgical History:  Past Surgical History  Procedure Laterality Date  . Prostate palladium gold seed implants (118)  11-15-2002    PROSTATE CANCER  . Excision melanoma from forehead  2010  . Coronary angioplasty with stent placement  09-01-2004      Cypher DES x 2 d-m LCx 2.5 mm x 20 mm & 2.5 mm x 13 mm (~2.75 mm)  . Transthoracic echocardiogram  MAR 52,7782    NORMAL EF > 55% / NORMAL FUNCTION WITH IMPAIRED RELAXATION AND MODERATE CONCENTRIC LVH LIKELY HYPERTENSIVE DISEASE  . Hydrocele excision  10/14/2011    Procedure: HYDROCELECTOMY ADULT;  Surgeon: Ailene Rud, MD;  Location: [facility];  Service: Urology;  Laterality: Left;  . Circumsision    . Laminectomy N/A  07/13/2012    Procedure: THORACIC LAMINECTOMY FOR RESECTION OF INTRAMEDULLARY SPINAL CHORD TUMOR WITH SPINAL CHORD MONITORING;  Surgeon: Erline Levine, MD;  Location: [place] [facility];  Service: Neurosurgery;  Laterality: N/A;  Thoracic laminectomy for resection of intramedullary spinal cord tumor with spinal cord monitering and Dr. Christella Noa to assist  . Nm myoview ltd  [id number]    Lexiscan: LOW RISK, mild inferior bowel artifact; No ischemia or Infarction  . Eye surgery Bilateral     cataracts  . Tonsillectomy    . Laminectomy N/A 12/09/2014    Procedure: Redo Thoracic laminectomy with intramedullary tumar resection/USN/[place]/Subarachnoid shunt/spinal cord  monitoring/Dr. Kathyrn Sheriff to assist;  Surgeon: Erline Levine, MD;  Location: [place] [facility];  Service: Neurosurgery;  Laterality: N/A;  Redo Thoracic laminectomy with intramedullary tumar resection/USN/[place]/Subarachnoid shunt/spinal cord monitoring/Dr. Kathyrn Sheriff to assist    Assessment & Plan Clinical Impression: A 79 y.o. 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 Patient lives with his wife had been independent until recently. Presented 12/09/2014 with gait abnormality, numbness in his perineum region. Recent MRI imaging thoracic spine showed progression of abnormal signal and enhancement extending T10-T11 ependymoma increased from comparison 03/07/2014. Underwent redo thoracic laminectomy with intramedullary tumor resection 12/09/2014 for Dr. Vertell Limber. Hospital course pain management. Decadron protocol as indicated. Pathology report pending. Bout of hematuria felt to be related to Foley tube irritation and monitor. Physical and occupational therapy evaluations completed 12/10/2014 with recommendations of physical medicine rehabilitation consult. Patient to be admitted for a comprehensive inpatient rehabilitation program.   Patient transferred to CIR on 12/12/2014 .    Patient currently requires mod with basic self-care skills secondary to muscle weakness, decreased cardiorespiratoy endurance, ataxia, absent LE sensation and decreased standing balance and decreased balance strategies.  Prior to hospitalization, patient was independent with ADLs, driving, working.  Patient will benefit from skilled intervention to increase independence with basic self-care skills prior to discharge home with care partner.  Anticipate patient will require intermittent supervision and follow up home health.  OT - End of Session Activity Tolerance: Tolerates 10 - 20 min activity with multiple rests Endurance Deficit:  Yes OT Assessment Rehab Potential (ACUTE ONLY): Good OT Patient demonstrates impairments in the following area(s): Balance;Endurance;Motor;Safety;Sensory OT Basic ADL's Functional Problem(s): Bathing;Dressing;Toileting OT Transfers Functional Problem(s): Toilet;Tub/Shower OT Additional Impairment(s): None OT Plan OT Intensity: Minimum of 1-2 x/day, 45 to 90 minutes OT Frequency: 5 out of 7 days OT Duration/Estimated Length of Stay: 12-14 days OT Treatment/Interventions: Balance/vestibular training;Discharge planning;DME/adaptive equipment instruction;Functional mobility training;Pain management;Psychosocial support;Patient/family education;Neuromuscular re-education;Self Care/advanced ADL retraining;Therapeutic Activities;Therapeutic Exercise;UE/LE Strength taining/ROM;UE/LE Coordination activities OT Self Feeding Anticipated Outcome(s): I OT Basic Self-Care Anticipated Outcome(s): mod I with dressing, supervision with shower OT Toileting Anticipated Outcome(s): mod I OT Bathroom Transfers Anticipated Outcome(s): mod I to toilet, supervision to shower OT Recommendation Patient destination: Home Follow Up Recommendations: Home health OT Equipment Recommended: To be determined Equipment Details: pt has shower seat and elevated toilet   Skilled Therapeutic Intervention Visit 1:  Pain:  No c/o pain. Pt seen for initial evaluation and ADL retraining to include shower and dressing. Pt highly motivated, but needs max cues to slow down and attend to R foot.  Discussed OT POC, goals, safety.  Reviewed back precautions.  Visit 2: Pain:  No c/o pain. [name] present for this session. Reviewed POC, goals.  Pt worked on ambulation to toilet with RW with steadying  A and min cues for R foot placement with sit to stand. Practiced toilet transfers with improved standing balance from this morning.  Pt then sat EOB to work on AROM exercises for activity tolerance with UB and AROM of LEs focusing on  coordination.  Pt able to cross legs this afternoon to doff sneakers independently. Assisted into bed. Pt resting in bed with all needs met.   OT Evaluation Precautions/Restrictions  Precautions Precautions: Fall Precaution Comments: laminectomy, ataxia Restrictions Weight Bearing Restrictions: No  Pain Pain Assessment Pain Assessment: No/denies pain Pain Score: 0-No pain Home Living/Prior Functioning Home Living Available Help at Discharge: Available 24 hours/day Type of Home: House Home Access: Stairs to enter [employer] of Steps: 3 (back entrance) Entrance Stairs-Rails: Right, Left, Can reach both Home Layout: Multi-level, Bed/bath upstairs (no bed or bath on main level) Alternate Level Stairs-Number of Steps: 8 Alternate Level Stairs-Rails: Right Bathroom Shower/Tub: Walk-in shower  Lives With: Spouse Prior Function Level of Independence: Independent with gait, Independent with transfers, Independent with basic ADLs, Independent with homemaking with ambulation  Able to Take Stairs?: Yes Driving: Yes Vocation: Full time employment   Vision/Perception  Vision- History Baseline Vision/History: Wears glasses Wears Glasses: Reading only Patient Visual Report: No change from baseline (recent cataract surgery) Vision- Assessment Vision Assessment?: No apparent visual deficits Perception Comments: WFL  Cognition Overall Cognitive Status: Within Functional Limits for tasks assessed Arousal/Alertness: Awake/alert Orientation Level: Person;Place;Situation Person: Oriented Place: Oriented Situation: Oriented Year: 2016 Month: September Day of Week: Correct Memory: Impaired Memory Impairment: Decreased recall of new information Immediate Memory Recall: Sock;Blue;Bed Memory Recall: Sock;Blue;Bed Memory Recall Sock: Without Cue Memory Recall Blue: Without Cue Memory Recall Bed: Without Cue Awareness: Appears intact Behaviors: Impulsive Safety/Judgment:  Appears intact Sensation Sensation Light Touch: Impaired Detail Light Touch Impaired Details: Absent RLE;Absent LLE Stereognosis: Appears Intact Hot/Cold: Appears Intact Proprioception: Impaired Detail Proprioception Impaired Details: Absent RLE;Absent LLE Additional Comments:  (light absent/impaired below belly button) Coordination Gross Motor Movements are Fluid and Coordinated: No Fine Motor Movements are Fluid and Coordinated:  (impaired in BLE only) Finger Nose Finger Test: [facility] Heel Shin Test: impaired R greater than L Motor  Motor Motor: Ataxia Motor - Skilled Clinical Observations: Ataxia R worse than L Mobility  Bed Mobility Bed Mobility: Rolling Left;Rolling Right;Supine to Sit;Sit to Supine Rolling Right: 5: Supervision Rolling Left: 5: Supervision Supine to Sit: 5: Supervision Sit to Supine: 5: Supervision Transfers Sit to Stand: 3: Mod assist Stand to Sit: 3: Mod assist  Trunk/Postural Assessment  Cervical Assessment Cervical Assessment: Within Functional Limits Thoracic Assessment Thoracic Assessment: Within Functional Limits Lumbar Assessment Lumbar Assessment: Within Functional Limits Postural Control Postural Control: Within Functional Limits  Balance Balance Balance Assessed: Yes Static Sitting Balance Static Sitting - Balance Support: Feet supported Static Sitting - Level of Assistance: 6: Modified independent (Device/Increase time) Static Standing Balance Static Standing - Balance Support: During functional activity Static Standing - Level of Assistance: 3: Mod assist Dynamic Standing Balance Dynamic Standing - Balance Support: During functional activity Dynamic Standing - Level of Assistance: 3: Mod assist Extremity/Trunk Assessment RUE Assessment RUE Assessment: Within Functional Limits LUE Assessment LUE Assessment: Within Functional Limits   See Function Navigator for Current Functional Status.   Refer to Care Plan for Long Term  Goals  Recommendations for other services: None  Discharge Criteria: Patient will be discharged from OT if patient refuses treatment 3 consecutive times without medical reason, if treatment goals not met, if there is a change in  medical status, if patient makes no progress towards goals or if patient is discharged from hospital.  The above assessment, treatment plan, treatment alternatives and goals were discussed and mutually agreed upon: by patient  [facility] 12/13/2014, 12:41 PM

## 2014-12-14 ENCOUNTER — Inpatient Hospital Stay (HOSPITAL_COMMUNITY): Payer: Medicare Other

## 2014-12-14 ENCOUNTER — Inpatient Hospital Stay (HOSPITAL_COMMUNITY): Payer: Medicare Other | Admitting: Physical Therapy

## 2014-12-14 DIAGNOSIS — R609 Edema, unspecified: Secondary | ICD-10-CM

## 2014-12-14 LAB — GLUCOSE, CAPILLARY
Glucose-Capillary: 240 mg/dL — ABNORMAL HIGH (ref 65–99)
Glucose-Capillary: 278 mg/dL — ABNORMAL HIGH (ref 65–99)
Glucose-Capillary: 221 mg/dL — ABNORMAL HIGH (ref 65–99)
Glucose-Capillary: 317 mg/dL — ABNORMAL HIGH (ref 65–99)

## 2014-12-14 MED ORDER — IRBESARTAN 300 MG PO TABS
150.0000 mg | ORAL_TABLET | Freq: Every day | ORAL | Status: DC
  Administered 2014-12-15 – 2014-12-25 (×11): 150 mg via ORAL
  Filled 2014-12-14 (×11): qty 1

## 2014-12-14 MED ORDER — METFORMIN HCL 500 MG PO TABS
500.0000 mg | ORAL_TABLET | Freq: Every day | ORAL | Status: DC
  Administered 2014-12-14 – 2014-12-21 (×8): 500 mg via ORAL
  Filled 2014-12-14 (×9): qty 1

## 2014-12-14 NOTE — Progress Notes (Signed)
Patient placed himself on CPAP for the night. Patient is tolerating well.

## 2014-12-14 NOTE — Progress Notes (Signed)
Bilateral lower extremity venous duplex order received. 12/14/2014 at 1430 I called [phone] and requested that the patient be placed in the bed so that the test could be performed. Attempted to perform bilateral lower extremity venous duplex at 1445, 1515, and 1540, and the patient was in his wheelchair at each of these attempts. I requested that the patient be placed in the bed at each of these attempts so that the test could be performed, however he was not placed in the bed until the end of my shift, therefore I was unable to perform a quality diagnostic exam in the time available to me.  12/14/2014 4:14 PM Maudry Mayhew, RVT, RDCS, RDMS

## 2014-12-14 NOTE — Progress Notes (Signed)
VASCULAR LAB PRELIMINARY  PRELIMINARY  PRELIMINARY  PRELIMINARY  Bilateral lower extremity venous duplex completed.    Preliminary report:  There is no DVT or SVT noted in the bilateral lower extremities.   Tressia Labrum, RVT 12/14/2014, 7:37 PM

## 2014-12-14 NOTE — Progress Notes (Signed)
Physical Therapy Session Note  Patient Details  Name: [name] MRN: [id number] Date of Birth: [birth year]  Today's Date: 12/14/2014 PT Individual Time: [id number] PT Individual Time Calculation (min): 45 min   Short Term Goals: Week 1:  PT Short Term Goal 1 (Week 1): Pt will be mod I with rolling and transfers supine to edge of bed, edge of bed to supine.  PT Short Term Goal 2 (Week 1): Pt will transfer stand pivot with/without rolling walker and min A. PT Short Term Goal 3 (Week 1): Pt will ambulate with LRAD about 150 feet with min A.  PT Short Term Goal 4 (Week 1): Pt will ascend/descend 12 stairs with B rails and min A.  PT Short Term Goal 5 (Week 1): Pt will propel w/c about 150 feet with mod I.   Skilled Therapeutic Interventions/Progress Updates:  Pt was seen bedside in the pm. Pt transferred supine to edge of bed with S. Pt transferred edge of bed to w/c squat pivot with min A. Pt propelled w/c to gym with S and B UEs. Pt ambulated 140 and 180 feet with rolling walker and min A with verbal cues. Improved control noted B LEs. Treatment in gym focused on NMR with cone taps, 3 sets x 10 reps each. Pt ascended.descended 12 stairs with B rails and min A. Pt propelled w/c back to room with S and verbal cues. Pt left sitting up in w/c with call bell within reach.   Therapy Documentation Precautions:  Precautions Precautions: Fall Precaution Comments: laminectomy, ataxia Restrictions Weight Bearing Restrictions: No General:  Pain: No c/o pain.     See Function Navigator for Current Functional Status.   Therapy/Group: Individual Therapy  Dub Amis 12/14/2014, 2:53 PM

## 2014-12-14 NOTE — Progress Notes (Signed)
[name] is a 79 y.o. male [birth year] [id number]  Subjective: No new complaints. Elev SBP and CBGs. Slept well. Feeling OK.  Objective: Vital signs in last 24 hours: Temp:  [97.4 F (36.3 C)-97.8 F (36.6 C)] 97.8 F (36.6 C) (09/11 0545) Pulse Rate:  [50-62] 50 (09/11 0545) Resp:  [17-20] 17 (09/11 0545) BP: (140-179)/(71-87) 179/87 mmHg (09/11 0545) SpO2:  [93 %-94 %] 93 % (09/11 0545) Weight change:  Last BM Date: 12/13/14  Intake/Output from previous day: 09/10 0701 - 09/11 0700 In: 1320 [P.O.:1320] Out: 1100 [Urine:1100] Last cbgs: CBG (last 3)   Recent Labs  12/13/14 1627 12/13/14 2049 12/14/14 0606  GLUCAP 199* 255* 240*     Physical Exam General: No apparent distress  HEENT: not dry Lungs: Normal effort. Lungs clear to auscultation, no crackles or wheezes. Cardiovascular: Regular rate and rhythm, no edema Abdomen: S/NT/ND; BS(+) Musculoskeletal:  unchanged Neurological: No new neurological deficits Wounds: clean Skin: clear  Aging changes Mental state: Alert, oriented, cooperative    Lab Results: BMET    Component Value Date/Time   NA 138 12/01/2014 1023   K 3.7 12/01/2014 1023   CL 106 12/01/2014 1023   CO2 26 12/01/2014 1023   GLUCOSE 171* 12/01/2014 1023   BUN 20 12/01/2014 1023   CREATININE 1.09 12/01/2014 1023   CREATININE 0.92 04/16/2013 0959   CALCIUM 8.9 12/01/2014 1023   GFRNONAA >60 12/01/2014 1023   GFRAA >60 12/01/2014 1023   CBC    Component Value Date/Time   WBC 7.8 12/01/2014 1023   RBC 5.02 12/01/2014 1023   HGB 15.0 12/01/2014 1023   HCT 42.6 12/01/2014 1023   PLT 169 12/01/2014 1023   MCV 84.9 12/01/2014 1023   MCH 29.9 12/01/2014 1023   MCHC 35.2 12/01/2014 1023   RDW 13.9 12/01/2014 1023   LYMPHSABS 1.3 03/13/2012 0100   MONOABS 1.1* 03/13/2012 0100   EOSABS 0.1 03/13/2012 0100   BASOSABS 0.0 03/13/2012 0100    Studies/Results: No results found.  Medications: I have reviewed the patient's current  medications.  Assessment/Plan:  1. 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 -begin CIR therapies -steroid taper 2. DVT Prophylaxis/Anticoagulation: SCDs. Check vascular study upon admission given VTE risk. 3. Pain Management: Hydrocodone and Robaxin as needed. Monitor with increased mobility 4. Mood: Lexapro 10 mg daily. Provide emotional support 5. Neuropsych: This patient is capable of making decisions on his own behalf. 6. Skin/Wound Care: Routine skin checks, encourage appropriate nutrition. -keep area dry 7. Fluids/Electrolytes/Nutrition: Routine I&O with follow-up chemistries with admit labs 8. Hypertension - worse on steroids. Norvasc 10 mg daily, Avapro: increase to 150 mg daily. Monitor with increased mobility 9. Constipation/neurogenic bowel: Adjust bowel program. Has been constipated 10. Hyperlipidemia. Crestor 11. History of gout. Allopurinol 300 mg daily. Monitor for any flareups 12. BPH with history of prostate cancer/hematuria. Flomax 0.4 mg daily, Ditropan 15 mg daily at bedtime. Check PVRs 3. Follow for voiding patterns 13. CAD with stenting. Resumed aspirin and Plavix -therapy to monitor for symptoms with increased exertion 14. DM due to steroids. Added Metformin    Length of stay, days: 2  Walker Kehr , MD 12/14/2014, 8:43 AM

## 2014-12-15 ENCOUNTER — Inpatient Hospital Stay (HOSPITAL_COMMUNITY): Payer: Medicare Other | Admitting: Physical Therapy

## 2014-12-15 ENCOUNTER — Inpatient Hospital Stay (HOSPITAL_COMMUNITY): Payer: Medicare Other | Admitting: Occupational Therapy

## 2014-12-15 DIAGNOSIS — IMO0002 Reserved for concepts with insufficient information to code with codable children: Secondary | ICD-10-CM | POA: Diagnosis present

## 2014-12-15 DIAGNOSIS — E1165 Type 2 diabetes mellitus with hyperglycemia: Secondary | ICD-10-CM

## 2014-12-15 LAB — CBC WITH DIFFERENTIAL/PLATELET
Basophils Absolute: 0 10*3/uL (ref 0.0–0.1)
Basophils Relative: 0 % (ref 0–1)
Eosinophils Absolute: 0 10*3/uL (ref 0.0–0.7)
Eosinophils Relative: 0 % (ref 0–5)
HCT: 45.3 % (ref 39.0–52.0)
Hemoglobin: 15.5 g/dL (ref 13.0–17.0)
Lymphocytes Relative: 8 % — ABNORMAL LOW (ref 12–46)
Lymphs Abs: 0.8 10*3/uL (ref 0.7–4.0)
MCH: 29 pg (ref 26.0–34.0)
MCHC: 34.2 g/dL (ref 30.0–36.0)
MCV: 84.8 fL (ref 78.0–100.0)
Monocytes Absolute: 0.7 10*3/uL (ref 0.1–1.0)
Monocytes Relative: 7 % (ref 3–12)
Neutro Abs: 7.9 10*3/uL — ABNORMAL HIGH (ref 1.7–7.7)
Neutrophils Relative %: 85 % — ABNORMAL HIGH (ref 43–77)
Platelets: 189 10*3/uL (ref 150–400)
RBC: 5.34 MIL/uL (ref 4.22–5.81)
RDW: 13.7 % (ref 11.5–15.5)
WBC: 9.4 10*3/uL (ref 4.0–10.5)

## 2014-12-15 LAB — GLUCOSE, CAPILLARY
Glucose-Capillary: 232 mg/dL — ABNORMAL HIGH (ref 65–99)
Glucose-Capillary: 300 mg/dL — ABNORMAL HIGH (ref 65–99)
Glucose-Capillary: 186 mg/dL — ABNORMAL HIGH (ref 65–99)
Glucose-Capillary: 215 mg/dL — ABNORMAL HIGH (ref 65–99)

## 2014-12-15 LAB — COMPREHENSIVE METABOLIC PANEL
ALT: 23 U/L (ref 17–63)
AST: 26 U/L (ref 15–41)
Albumin: 3.6 g/dL (ref 3.5–5.0)
Alkaline Phosphatase: 81 U/L (ref 38–126)
Anion gap: 8 (ref 5–15)
BUN: 15 mg/dL (ref 6–20)
CO2: 25 mmol/L (ref 22–32)
Calcium: 9.2 mg/dL (ref 8.9–10.3)
Chloride: 103 mmol/L (ref 101–111)
Creatinine, Ser: 0.98 mg/dL (ref 0.61–1.24)
GFR calc Af Amer: >60 mL/min (ref >60)
GFR calc non Af Amer: >60 mL/min (ref >60)
Glucose, Bld: 134 mg/dL — ABNORMAL HIGH (ref 65–99)
Potassium: 3.7 mmol/L (ref 3.5–5.1)
Sodium: 136 mmol/L (ref 135–145)
Total Bilirubin: 0.3 mg/dL (ref 0.3–1.2)
Total Protein: 7.2 g/dL (ref 6.5–8.1)

## 2014-12-15 MED ORDER — DEXAMETHASONE SODIUM PHOSPHATE 4 MG/ML IJ SOLN
4.0000 mg | Freq: Three times a day (TID) | INTRAMUSCULAR | Status: DC
  Filled 2014-12-15 (×6): qty 1

## 2014-12-15 MED ORDER — DEXAMETHASONE 4 MG PO TABS
4.0000 mg | ORAL_TABLET | Freq: Three times a day (TID) | ORAL | Status: DC
Start: 2014-12-15 — End: 2014-12-17
  Administered 2014-12-15 – 2014-12-17 (×6): 4 mg via ORAL
  Filled 2014-12-15 (×6): qty 1

## 2014-12-15 NOTE — IPOC Note (Signed)
Overall Plan of Care [facility]) Patient Details Name: [name] MRN: [id number] DOB: February 18, 1936  Admitting Diagnosis: [facility] Problems: Principal Problem:   Ependymoma of spinal cord Active Problems:   Essential hypertension   Neurogenic bowel   Incomplete paraplegia   Diabetes type 2, uncontrolled     Functional Problem List: Nursing Bowel, Medication Management, Pain, Safety, Nutrition, Skin Integrity, Sensory  PT Balance, Endurance, Motor  OT Balance, Endurance, Motor, Safety, Sensory  SLP    TR         Basic ADL's: OT Bathing, Dressing, Toileting     Advanced  ADL's: OT       Transfers: PT Bed Mobility, Bed to Chair, Teacher, early years/pre, Tub/Shower     Locomotion: PT Ambulation, Stairs, Wheelchair Mobility     Additional Impairments: OT None  SLP        TR      Anticipated Outcomes Item Anticipated Outcome  Self Feeding I  Swallowing      Basic self-care  mod I with dressing, supervision with shower  Toileting  mod I   Bathroom Transfers mod I to toilet, supervision to shower  Bowel/Bladder  continent of bowel and bladder LBM 9/4  Transfers  mod I transfers  Locomotion  S/mod I ambulation, mod I w/c mobility, S for stairs  Communication     Cognition     Pain  less than 3  Safety/Judgment  Supervision   Therapy Plan: PT Intensity: Minimum of 1-2 x/day ,45 to 90 minutes PT Frequency: 5 out of 7 days PT Duration Estimated Length of Stay: 12 to 14 days OT Intensity: Minimum of 1-2 x/day, 45 to 90 minutes OT Frequency: 5 out of 7 days OT Duration/Estimated Length of Stay: 12-14 days         Team Interventions: Nursing Interventions Patient/Family Education, Bowel Management, Disease Management/Prevention, Pain Management, Medication Management, Skin Care/Wound Management  PT interventions Ambulation/gait training, Discharge planning, Cognitive remediation/compensation, Balance/vestibular training, DME/adaptive  equipment instruction, Functional mobility training, Patient/family education, Neuromuscular re-education, Psychosocial support, Splinting/orthotics, Therapeutic Exercise, Therapeutic Activities, Stair training, UE/LE Strength taining/ROM, UE/LE Coordination activities, Wheelchair propulsion/positioning  OT Interventions Balance/vestibular training, Discharge planning, DME/adaptive equipment instruction, Functional mobility training, Pain management, Psychosocial support, Patient/family education, Neuromuscular re-education, Self Care/advanced ADL retraining, Therapeutic Activities, Therapeutic Exercise, UE/LE Strength taining/ROM, UE/LE Coordination activities  SLP Interventions    TR Interventions    SW/CM Interventions Discharge Planning, Psychosocial Support, Patient/Family Education    Team Discharge Planning: Destination: PT-Home ,OT- Home , SLP-  Projected Follow-up: PT-Home health PT, OT-  Home health OT, SLP-  Projected Equipment Needs: PT-To be determined, OT- To be determined, SLP-  Equipment Details: PT- , OT-pt has shower seat and elevated toilet Patient/family involved in discharge planning: PT- Patient,  OT-Patient, SLP-   MD ELOS: 10-12d Medical Rehab Prognosis:  Good Assessment: 79 y.o. 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 Patient lives with his wife had been independent until recently. Presented 12/09/2014 with gait abnormality, numbness in his perineum region. Recent MRI imaging thoracic spine showed progression of abnormal signal and enhancement extending T10-T11 ependymoma increased from comparison 03/07/2014. Underwent redo thoracic laminectomy with intramedullary tumor resection 12/09/2014 for Dr. Vertell Limber. Hospital course pain management. Decadron protocol as indicated. Pathology report pending. Bout of hematuria  felt to be related to Foley  tube irritation and monitor.    Now requiring 24/7 Rehab RN,MD, as well as CIR level PT, OT .  Treatment team will focus on ADLs and mobility with goals set at Mod I See Team Conference Notes for weekly updates to the plan of care

## 2014-12-15 NOTE — Plan of Care (Signed)
Problem: RH Wheelchair Mobility Goal: LTG Patient will propel w/c in controlled environment (PT) LTG: Patient will propel wheelchair in controlled environment, # of feet with assist (PT)  Outcome: Not Applicable Date Met:  11/16/46 D/C 12/15/14 pt is ambulatory Goal: LTG Patient will propel w/c in home environment (PT) LTG: Patient will propel wheelchair in home environment, # of feet with assistance (PT).  Outcome: Not Applicable Date Met:  18/56/31 D/C 12/15/14; pt is ambulatory Goal: LTG Patient will propel w/c in community environment (PT) LTG: Patient will propel wheelchair in community environment, # of feet with assist (PT)  Outcome: Not Applicable Date Met:  49/70/26 D/C 12/15/14; pt is ambulatory

## 2014-12-15 NOTE — Progress Notes (Signed)
Occupational Therapy Session Note  Patient Details  Name: [name] MRN: [id number] Date of Birth: Mar 28, 1936  Today's Date: 12/15/2014 OT Individual Time: 1000-1100 OT Individual Time Calculation (min): 60 min    Short Term Goals: Week 1:  OT Short Term Goal 1 (Week 1): Pt will stand with RW to pull pants over hips with steadying A. OT Short Term Goal 2 (Week 1): Pt will be able to don socks with shoe horn with min cues. OT Short Term Goal 3 (Week 1): Pt will be able to transfer stand pivot to toilet with steadying A. OT Short Term Goal 4 (Week 1): Pt will demonstrate awareness of R foot placement with no cuing during sit to stand.  Skilled Therapeutic Interventions/Progress Updates:    Pt seen for OT ADL bathing and dressing session. Pt in hallway upon arrival with hand off from PT. He verbalized desire to complete showering task. He ambulated throughout room with min A- close supervision using RW. VCs provided throughout session for safety awareness as pt is impulsive with decreased insight into deficits. He completed bathing task seated on shower chair with supervision and steadying assist to stand to complete buttock hygiene. He dresses seated EOB with set-up/ supervision, demonstrating ability to don B shoes/socks without assist. He completed grooming task standing at sink with steadying assist and VCs for proper foot position to attain wide BOS.  He ambulated throughout unit with RW. Max VCs provided for assistance in attaining rhythmic walking as pt with alternating fast and slow ambulation with decreased balance. Pt returned to room at end of session, left sitting on EOB with all needs in reach. Pt educated throughout session regarding RW management, need for assist, energy conservation, and d/c planning.   Therapy Documentation Precautions:  Precautions Precautions: Fall Precaution Comments: laminectomy, ataxia Restrictions Weight Bearing Restrictions: No Pain: Pain  Assessment Pain Assessment: No/denies pain  See Function Navigator for Current Functional Status.   Therapy/Group: Individual Therapy  Lewis, Jock Mahon C 12/15/2014, 7:15 AM

## 2014-12-15 NOTE — Care Management Note (Signed)
[facility] Individual Statement of Services  Patient Name:  [name]  Date:  12/15/2014  Welcome to the [facility].  Our goal is to provide you with an individualized program based on your diagnosis and situation, designed to meet your specific needs.  With this comprehensive rehabilitation program, you will be expected to participate in at least 3 hours of rehabilitation therapies Monday-Friday, with modified therapy programming on the weekends.  Your rehabilitation program will include the following services:  Physical Therapy (PT), Occupational Therapy (OT), 24 hour per day rehabilitation nursing, Therapeutic Recreaction (TR), Case Management (Social Worker), Rehabilitation Medicine, Nutrition Services and Pharmacy Services  Weekly team conferences will be held on Tuesdays to discuss your progress.  Your Social Worker will talk with you frequently to get your input and to update you on team discussions.  Team conferences with you and your family in attendance may also be held.  Expected length of stay: 10-12 days  Overall anticipated outcome: modified independent  Depending on your progress and recovery, your program may change. Your Social Worker will coordinate services and will keep you informed of any changes. Your Social Worker's name and contact numbers are listed  below.  The following services may also be recommended but are not provided by the [facility] will be made to provide these services after discharge if needed.  Arrangements include referral to agencies that provide these services.  Your insurance has been verified to be:  Medicare and [place] Your primary doctor is:  Dr. Lavone Orn  Pertinent information will be shared with your doctor and your insurance  company.  Social Worker:  [place], [place] or ([phone]   Information discussed with and copy given to patient by: Lennart Pall, 12/15/2014, 2:26 PM

## 2014-12-15 NOTE — Progress Notes (Signed)
Physical Therapy Session Note  Patient Details  Name: [name] MRN: [id number] Date of Birth: [birth year]  Today's Date: 12/15/2014 PT Individual Time: 0900-1000 and 1300-1330 PT Individual Time Calculation (min): 60 min and 30 min   Short Term Goals: Week 1:  PT Short Term Goal 1 (Week 1): Pt will be mod I with rolling and transfers supine to edge of bed, edge of bed to supine.  PT Short Term Goal 2 (Week 1): Pt will transfer stand pivot with/without rolling walker and min A. PT Short Term Goal 3 (Week 1): Pt will ambulate with LRAD about 150 feet with min A.  PT Short Term Goal 4 (Week 1): Pt will ascend/descend 12 stairs with B rails and min A.  PT Short Term Goal 5 (Week 1): Pt will propel w/c about 150 feet with mod I.   Skilled Therapeutic Interventions/Progress Updates:   Asleep in bed; pt feeling "dizzy" but describes it as feeling groggy or like he can't wake up fully.  Pt denies pain.  Performed bed mobility supervision; seated EOB pt donned shorts with supervision and verbal cues for safety and technique; pt's movements are quick and uncoordinated.  Performed sit > stand to pull up pants with min A for balance.  Performed gait with RW x 100' + 100' with min A overall but verbal cues to maintain safe distance to RW, full step length, and safe placement of RLE.  Once in gym pt reporting a possible need to have a BM but unable to tell due to numbness.  Transferred to toilet with RW and min A and verbal cues for safety; required min A for balance and safety during standing while pt performed hygiene.  Pt able to doff and don clothing with steadying assist.  Back in gym reviewed stair negotiation with bilat rails; pt negotiated 4 stairs x 2 reps (8 total) with 2 rails and min A with pt initially performing step to sequence and then progressing to alternating sequence with verbal cues.  Initiated BERG balance assessment; will finish in pm.  Returned to room and pt handed off to OT for  B&D.   PM session: pt seated EOB with family present to visit.  Performed sit > stand and ambulated to therapy gym with RW and min A with continued verbal cues for safe use of RW, to maintain safe distance to RW and for attention to RLE full step length and placement of RLE.  In gym performed rest of BERG balance assessment, see results below.  Discussed falls risk with pt.  Performed RLE taps to 6" step with focus on maintaining COG over BOS during taps (pt present with posterior lean/bias) and improving coordination/control of RLE vs. Speed and quantity of movement (pt continues to move quickly and impulsively).  Returned to room and pt left seated EOB with all items within reach.  Therapy Documentation Precautions:  Precautions Precautions: Fall Precaution Comments: laminectomy, ataxia Restrictions Weight Bearing Restrictions: No Vital Signs: Therapy Vitals Temp: 97.8 F (36.6 C) Temp Source: Oral Pulse Rate: (!) 110 Resp: 18 BP: (!) 165/76 mmHg Patient Position (if appropriate): Sitting Oxygen Therapy SpO2: 99 % O2 Device: Not Delivered Pain: Pain Assessment Pain Assessment: No/denies pain Balance: Standardized Balance Assessment Standardized Balance Assessment: Berg Balance Test Berg Balance Test Sit to Stand: Able to stand  independently using hands Standing Unsupported: Able to stand 30 seconds unsupported Sitting with Back Unsupported but Feet Supported on Floor or Stool: Able to sit safely and securely  2 minutes Stand to Sit: Controls descent by using hands Transfers: Able to transfer with verbal cueing and /or supervision Standing Unsupported with Eyes Closed: Able to stand 3 seconds Standing Ubsupported with Feet Together: Needs help to attain position but able to stand for 30 seconds with feet together From Standing, Reach Forward with Outstretched Arm: Reaches forward but needs supervision From Standing Position, Pick up Object from Floor: Unable to try/needs assist  to keep balance From Standing Position, Turn to Look Behind Over each Shoulder: Needs assist to keep from losing balance and falling Turn 360 Degrees: Needs assistance while turning Standing Unsupported, Alternately Place Feet on Step/Stool: Needs assistance to keep from falling or unable to try Standing Unsupported, One Foot in Front: Needs help to step but can hold 15 seconds Standing on One Leg: Tries to lift leg/unable to hold 3 seconds but remains standing independently Total Score: 20  Patient demonstrates increased fall risk as noted by score of 20/56 on Berg Balance Scale.  (<36= high risk for falls, close to 100%; 37-45 significant >80%; 46-51 moderate >50%; 52-55 lower >25%)  See Function Navigator for Current Functional Status.   Therapy/Group: Individual Therapy  Raylene Everts [facility] 12/15/2014, 12:23 PM

## 2014-12-15 NOTE — Progress Notes (Addendum)
[facility] PHYSICAL MEDICINE & REHABILITATION     PROGRESS NOTE    Subjective/Complaints: Had a pretty good weekend. Emptied his bowels this morning---knew he had to empty but did not know when he was finished. Concerned about sugars---didn't know they were ever elevated before.  ROS: Pt denies fever, rash/itching, headache, blurred or double vision, nausea, vomiting, abdominal pain, diarrhea, chest pain, shortness of breath, palpitations, dysuria, dizziness,   back pain, bleeding, anxiety, or depression   Objective: Vital Signs: Blood pressure 170/81, pulse 56, temperature 97.7 F (36.5 C), temperature source Oral, resp. rate 18, height 5\' 9"  (1.753 m), weight 101.9 kg (224 lb 10.4 oz), SpO2 97 %. No results found.  Recent Labs  12/15/14 0559  WBC 9.4  HGB 15.5  HCT 45.3  PLT 189    Recent Labs  12/15/14 0559  NA 136  K 3.7  CL 103  GLUCOSE 134*  BUN 15  CREATININE 0.98  CALCIUM 9.2   CBG (last 3)   Recent Labs  12/14/14 1627 12/14/14 2057 12/15/14 0612  GLUCAP 221* 317* 232*    Wt Readings from Last 3 Encounters:  12/12/14 101.9 kg (224 lb 10.4 oz)  12/11/14 102.649 kg (226 lb 4.8 oz)  12/01/14 99.474 kg (219 lb 4.8 oz)    Physical Exam:  Constitutional: He is oriented to person, place, and time. He appears well-developed.  HENT: oral mucosa pink and moist Head: Normocephalic.  Eyes: EOM are normal.  Neck: Normal range of motion. Neck supple. No thyromegaly present.  Cardiovascular: Normal rate and regular rhythm. occasional pac Respiratory: Effort normal and breath sounds normal. No respiratory distress. No wheezes or rales GI: Soft. Bowel sounds are normal. He exhibits no distension.  Neurological: He is alert and oriented to person, place, and time.  Continued diminished LT/PP in bilateral lower extremities from upper thigh and distally. Decreased gross coordination of both legs, right more than left. RLE, mild limb ataxia: 4/5 hf, 3+ to 4-ke  and adf/apf. LLE: 4/5 hf, 4ke and 4-adf/apf. Bilateral UE: 5/5 deltoid, bicep, tricpe, wrist, HI. Marland Kitchen Cognitively he's appropriate with normal insight and awareness. Good sitting balance Skin:  Incision site is dressed clean and dry ---honeycomb dressing Psychiatric: He has a normal mood and affect. His behavior is normal  Assessment/Plan: 1. 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 Physiatrist is providing close team supervision and 24 hour management of active medical problems listed below. Physiatrist and rehab team continue to assess barriers to discharge/monitor patient progress toward functional and medical goals.  Function:  Bathing Bathing position   Position: Shower  Bathing parts Body parts bathed by patient: Right arm, Left arm, Chest, Abdomen, Front perineal area, Buttocks, Right upper leg, Left upper leg, Right lower leg, Left lower leg, Back (used long sponge)    Bathing assist Assist Level: Touching or steadying assistance(Pt > 75%)      Upper Body Dressing/Undressing Upper body dressing   What is the patient wearing?: Pull over shirt/dress     Pull over shirt/dress - Perfomed by patient: Thread/unthread right sleeve, Thread/unthread left sleeve, Put head through opening, Pull shirt over trunk          Upper body assist Assist Level: Set up      Lower Body Dressing/Undressing Lower body dressing   What is the patient wearing?: Underwear, Pants, Socks, Shoes Underwear - Performed by patient:  Thread/unthread right underwear leg, Thread/unthread left underwear leg, Pull underwear up/down   Pants- Performed by patient: Thread/unthread right pants leg, Thread/unthread left pants leg, Pull pants up/down         Socks - Performed by helper: Don/doff right sock, Don/doff left sock   Shoes - Performed by  helper: Don/doff right shoe, Don/doff left shoe, Fasten right, Fasten left          Lower body assist Assist Level: Touching or steadying assistance (Pt > 75%)      Toileting Toileting Toileting activity did not occur: N/A        Toileting assist     Transfers Chair/bed transfer   Chair/bed transfer method: Squat pivot Chair/bed transfer assist level: Touching or steadying assistance (Pt > 75%) Chair/bed transfer assistive device: Armrests     Locomotion Ambulation     Max distance: 180 Assist level: Touching or steadying assistance (Pt > 75%)   Wheelchair   Type: Manual Max wheelchair distance: 150 Assist Level: Supervision or verbal cues  Cognition Comprehension Comprehension assist level: Understands complex 90% of the time/cues 10% of the time  Expression Expression assist level: Expresses complex ideas: With extra time/assistive device  Social Interaction Social Interaction assist level: Interacts appropriately 90% of the time - Needs monitoring or encouragement for participation or interaction.  Problem Solving Problem solving assist level: Solves complex 90% of the time/cues < 10% of the time  Memory Memory assist level: Recognizes or recalls 90% of the time/requires cueing < 10% of the time   Medical Problem List and Plan: 1. 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 -begin CIR therapies -steroid taper to q8 2. DVT Prophylaxis/Anticoagulation: SCDs. Check vascular study upon admission given VTE risk. 3. Pain Management: Hydrocodone and Robaxin as needed. Monitor with increased mobility 4. Mood: Lexapro 10 mg daily. Provide emotional support 5. Neuropsych: This patient is capable of making decisions on his own behalf. 6. Skin/Wound Care: Routine skin checks, encourage appropriate nutrition. -keep area dry 7. Fluids/Electrolytes/Nutrition: all labs  personally reviewed today--continue to encourage po 8. Hypertension. Norvasc 10 mg daily, Avapro 75 mg daily. May need adjustment  -hopefully with decreased decadron, pressures will level off 9. Constipation/neurogenic bowel: Adjusted bowel program--had bm this morning. Has sense of emptying 10. Hyperlipidemia. Crestor 11. History of gout. Allopurinol 300 mg daily. Monitor for any flareups 12. BPH with history of prostate cancer/hematuria. Flomax 0.4 mg daily, Ditropan 15 mg daily at bedtime.   -emptying fairly frequently. No excess pvrs recorded---need to scan and confirm for retained urine 13. CAD with stenting. Resume aspirin and Plavix -therapy to monitor for symptoms with increased exertion 14. Hyperglycemia: hgb A1C 6.9  -now on carb mod diet  -reducing steroids to q8  -glucophage initiated yesterday  LOS (Days) 3 A FACE TO FACE EVALUATION WAS PERFORMED  [name] T 12/15/2014 8:29 AM

## 2014-12-15 NOTE — Progress Notes (Signed)
Patient information reviewed and entered into e[facility] System by Becky Riddick Nuon, covering PPS coordinator. Information including medical coding and functional independence measure will be reviewed and updated through discharge.  Per nursing, patient was given "Data Collection Information Summary for Patients in Inpatient Rehabilitation Facilities with attached Privacy Act Statement Health Care Records" upon admission.     

## 2014-12-15 NOTE — Progress Notes (Signed)
Occupational Therapy Session Note  Patient Details  Name: [name] MRN: [id number] Date of Birth: Dec 09, 1935  Today's Date: 12/15/2014 OT Individual Time: [id number] OT Individual Time Calculation (min): 30 min    Short Term Goals: Week 1:  OT Short Term Goal 1 (Week 1): Pt will stand with RW to pull pants over hips with steadying A. OT Short Term Goal 2 (Week 1): Pt will be able to don socks with shoe horn with min cues. OT Short Term Goal 3 (Week 1): Pt will be able to transfer stand pivot to toilet with steadying A. OT Short Term Goal 4 (Week 1): Pt will demonstrate awareness of R foot placement with no cuing during sit to stand.  Skilled Therapeutic Interventions/Progress Updates:    Treatment session with focus on safety, functional transfers, and sit <> stand.  Pt ambulated to ADL apt with min assist at quick, impulsive pace requiring mod cues to slow down and increase attention to task to improve safety.  Engaged in modified 5x sit> stand with pt able to stand from 17" surface with use of hands in 19.44 seconds, pt unable to stand without use of UE.  Educated on safe method to complete shower transfer, with stepping over simulated shower ledge backwards to improve safety and decrease fall risk.  Pt required verbal cues for sequencing to improve safety.  Returned to room with improved gait speed and control with RLE.  Pt performed toilet transfer with min assist and toileting with setup.  Pt left seated at EOB with RN present.  Therapy Documentation Precautions:  Precautions Precautions: Fall Precaution Comments: laminectomy, ataxia Restrictions Weight Bearing Restrictions: No General:   Vital Signs: Therapy Vitals Temp: 98.1 F (36.7 C) Temp Source: Oral Pulse Rate: (!) 115 (just finished therapy) Resp: (!) 22 BP: (!) 153/69 mmHg Patient Position (if appropriate): Sitting Oxygen Therapy SpO2: 97 % O2 Device: Not Delivered Pain: Pain Assessment Pain Assessment:  No/denies pain  See Function Navigator for Current Functional Status.   Therapy/Group: Individual Therapy  Simonne Come 12/15/2014, 3:29 PM

## 2014-12-15 NOTE — Progress Notes (Signed)
Physical Therapy Session Note  Patient Details  Name: [name] MRN: [id number] Date of Birth: [birth year]  Today's Date: 12/15/2014 PT Individual Time: 1600-1630 PT Individual Time Calculation (min): 30 min   Short Term Goals: Week 1:  PT Short Term Goal 1 (Week 1): Pt will be mod I with rolling and transfers supine to edge of bed, edge of bed to supine.  PT Short Term Goal 2 (Week 1): Pt will transfer stand pivot with/without rolling walker and min A. PT Short Term Goal 3 (Week 1): Pt will ambulate with LRAD about 150 feet with min A.  PT Short Term Goal 4 (Week 1): Pt will ascend/descend 12 stairs with B rails and min A.  PT Short Term Goal 5 (Week 1): Pt will propel w/c about 150 feet with mod I.   Skilled Therapeutic Interventions/Progress Updates:    Pt received supine in bed and agreeable to therapy.  Pt performs supine<>sit with supervision for safety.  Pt propels w/c to therapy gym with verbal cues for directions to gym only.  PT instructs patient in gait training x125' and x100' and x150' with RW and min A using 3# ankle weights along green floor stripe.  Gait training focused on LE motor control/planning and increasing BOS with carryover noted on return ambulation to room.  PT instructed patient in NuStep x10 minutes at 3 resistance with no rest breaks for reciprocal UE/LE movement and overall endurance/strengthening.  Pt amb back to room with RW and min A at end of session and positioned in supine with call bell in reach and needs met.  Pt continues to require verbal cues throughout session for safety as pt is impulsive and moves quickly.    Therapy Documentation Precautions:  Precautions Precautions: Fall Precaution Comments: laminectomy, ataxia Restrictions Weight Bearing Restrictions: No   Pain: Pain Assessment Pain Assessment: No/denies pain   See Function Navigator for Current Functional Status.   Therapy/Group: Individual Therapy  Earnest Conroy  [place] 12/15/2014, 4:44 PM

## 2014-12-16 ENCOUNTER — Inpatient Hospital Stay (HOSPITAL_COMMUNITY): Payer: Medicare Other | Admitting: Occupational Therapy

## 2014-12-16 ENCOUNTER — Inpatient Hospital Stay (HOSPITAL_COMMUNITY): Payer: Medicare Other | Admitting: Physical Therapy

## 2014-12-16 LAB — GLUCOSE, CAPILLARY
Glucose-Capillary: 209 mg/dL — ABNORMAL HIGH (ref 65–99)
Glucose-Capillary: 217 mg/dL — ABNORMAL HIGH (ref 65–99)
Glucose-Capillary: 152 mg/dL — ABNORMAL HIGH (ref 65–99)

## 2014-12-16 NOTE — Progress Notes (Signed)
Physical Therapy Session Note  Patient Details  Name: [name] MRN: [id number] Date of Birth: 1935-12-30  Today's Date: 12/16/2014 PT Individual Time: 1300-1400 PT Individual Time Calculation (min): 60 min   Short Term Goals: Week 1:  PT Short Term Goal 1 (Week 1): Pt will be mod I with rolling and transfers supine to edge of bed, edge of bed to supine.  PT Short Term Goal 2 (Week 1): Pt will transfer stand pivot with/without rolling walker and min A. PT Short Term Goal 3 (Week 1): Pt will ambulate with LRAD about 150 feet with min A.  PT Short Term Goal 4 (Week 1): Pt will ascend/descend 12 stairs with B rails and min A.  PT Short Term Goal 5 (Week 1): Pt will propel w/c about 150 feet with mod I.   Skilled Therapeutic Interventions/Progress Updates:   Session focused on coordination, controlled movement, safety awareness, awareness of deficits, standing balance, and patient education. Patient ambulated to/from room using heavy duty RW with min A overall, max multimodal cues for forward gaze, upright posture, safety and decreased pace due to impulsivity. In gym, pt requested to use toilet. Ambulated to ADL bathroom and urinated in standing with min A for balance, max cues to safely navigate using RW in tight space. Patient negotiated up/down 12 stairs using 2 rails with max verbal cues for safe step-to sequencing, when patient did not follow cues he demonstrated RLE buckling. Patient c/o R > L "tightness" in upper leg. On mat, performed sidelying and prone PROM to hip flexors and knee extensors with patient reporting improvement in discomfort. From edge of mat, worked on sit <> stand without UE support with focus on R foot placement, anterior weight shift, and shifting weight over COG due to posterior lean/pushing against mat with back of BLE, mod > min A overall. Transitioned to tall kneeling with small bench in front for UE support to work on hip extension to neutral progressed to mini  squats in tall kneel position with reaching task. Patient unable to follow color sequence during task despite max multimodal cues. Standing with R HHA, performed toe taps to colored floor spots using R and L LE, mod-max A for balance. Patient ambulated back to room and left sitting in recliner with all needs within reach.    Therapy Documentation Precautions:  Precautions Precautions: Fall Precaution Comments: laminectomy, ataxia Restrictions Weight Bearing Restrictions: No Pain: Pain Assessment Pain Assessment: No/denies pain  See Function Navigator for Current Functional Status.  Therapy/Group: Individual Therapy  Laretta Alstrom 12/16/2014, 2:13 PM

## 2014-12-16 NOTE — Progress Notes (Signed)
[name] PHYSICAL MEDICINE & REHABILITATION     PROGRESS NOTE    Subjective/Complaints: Sugars elevated. Really no other problems yesterday. ekg ordered (see below)  ROS: Pt denies fever, rash/itching, headache, blurred or double vision, nausea, vomiting, abdominal pain, diarrhea, chest pain, shortness of breath, palpitations, dysuria, dizziness,   back pain, bleeding, anxiety, or depression   Objective: Vital Signs: Blood pressure 173/80, pulse 51, temperature 98.1 F (36.7 C), temperature source Oral, resp. rate 18, height 5\' 9"  (1.753 m), weight 101.9 kg (224 lb 10.4 oz), SpO2 95 %. No results found.  Recent Labs  12/15/14 0559  WBC 9.4  HGB 15.5  HCT 45.3  PLT 189    Recent Labs  12/15/14 0559  NA 136  K 3.7  CL 103  GLUCOSE 134*  BUN 15  CREATININE 0.98  CALCIUM 9.2   CBG (last 3)   Recent Labs  12/15/14 1659 12/15/14 2107 12/16/14 0640  GLUCAP 186* 215* 209*    Wt Readings from Last 3 Encounters:  12/12/14 101.9 kg (224 lb 10.4 oz)  12/11/14 102.649 kg (226 lb 4.8 oz)  12/01/14 99.474 kg (219 lb 4.8 oz)    Physical Exam:  Constitutional: He is oriented to person, place, and time. He appears well-developed.  HENT: oral mucosa pink and moist Head: Normocephalic.  Eyes: EOM are normal.  Neck: Normal range of motion. Neck supple. No thyromegaly present.  Cardiovascular: Normal rate and regular rhythm. rare pac Respiratory: Effort normal and breath sounds normal. No respiratory distress. No wheezes or rales GI: Soft. Bowel sounds are normal. He exhibits no distension.  Neurological: He is alert and oriented to person, place, and time.  Continued diminished LT/PP in bilateral lower extremities from upper thigh and distally. Decreased gross coordination of both legs, right more than left. RLE, mild limb ataxia: 4/5 hf,   4-ke and 3+ to 4- adf/apf. LLE: 4/5 hf, 4ke and 4-adf/apf. Bilateral UE: 5/5 deltoid, bicep, tricpe, wrist, HI. Marland Kitchen Cognitively  he's appropriate with normal insight and awareness.   Skin:  Incision site is dressed clean and dry ---honeycomb dressing still in place Psychiatric: He has a normal mood and affect. His behavior is normal  Assessment/Plan: 1. 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 Physiatrist is providing close team supervision and 24 hour management of active medical problems listed below. Physiatrist and rehab team continue to assess barriers to discharge/monitor patient progress toward functional and medical goals.  Function:  Bathing Bathing position   Position: Shower  Bathing parts Body parts bathed by patient: Right arm, Left arm, Chest, Abdomen, Front perineal area, Buttocks, Right upper leg, Left upper leg, Right lower leg, Left lower leg, Back    Bathing assist Assist Level: Touching or steadying assistance(Pt > 75%)      Upper Body Dressing/Undressing Upper body dressing   What is the patient wearing?: Pull over shirt/dress     Pull over shirt/dress - Perfomed by patient: Thread/unthread right sleeve, Thread/unthread left sleeve, Put head through opening, Pull shirt over trunk          Upper body assist Assist Level: More than reasonable time      Lower Body Dressing/Undressing Lower body dressing   What is the patient wearing?: Underwear, Pants, Socks, Shoes Underwear - Performed by patient: Thread/unthread right underwear leg, Thread/unthread left underwear leg, Pull underwear up/down   Pants- Performed by  patient: Thread/unthread right pants leg, Thread/unthread left pants leg, Pull pants up/down         Socks - Performed by helper: Don/doff right sock, Don/doff left sock Shoes - Performed by patient: Don/doff right shoe, Don/doff left shoe, Fasten right, Fasten left Shoes - Performed by helper: Don/doff right  shoe, Don/doff left shoe, Fasten right, Fasten left          Lower body assist Assist Level: Touching or steadying assistance (Pt > 75%)      Toileting Toileting Toileting activity did not occur: N/A Toileting steps completed by patient: Adjust clothing prior to toileting, Performs perineal hygiene, Adjust clothing after toileting   Toileting Assistive Devices: Grab bar or rail  Toileting assist Assist level: Supervision or verbal cues   Transfers Chair/bed transfer   Chair/bed transfer method: Ambulatory Chair/bed transfer assist level: Touching or steadying assistance (Pt > 75%) Chair/bed transfer assistive device: Medical sales representative     Max distance: 150 Assist level: Touching or steadying assistance (Pt > 75%)   Wheelchair Wheelchair activity did not occur: N/A Type: Manual Max wheelchair distance: 150 Assist Level: Supervision or verbal cues  Cognition Comprehension Comprehension assist level: Understands complex 90% of the time/cues 10% of the time  Expression Expression assist level: Expresses complex ideas: With extra time/assistive device  Social Interaction Social Interaction assist level: Interacts appropriately 90% of the time - Needs monitoring or encouragement for participation or interaction.  Problem Solving Problem solving assist level: Solves complex 90% of the time/cues < 10% of the time  Memory Memory assist level: Recognizes or recalls 90% of the time/requires cueing < 10% of the time   Medical Problem List and Plan: 1. 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 -continue CIR therapies. Conference today 2. DVT Prophylaxis/Anticoagulation: SCDs. ambulation 3. Pain Management: Hydrocodone and Robaxin as needed. Monitor with increased mobility 4. Mood: Lexapro 10 mg daily. Provide emotional support 5. Neuropsych: This patient is capable of making decisions on  his own behalf. 6. Skin/Wound Care: surgical wound healing nicely. 7. Fluids/Electrolytes/Nutrition: all labs personally reviewed today--continue to encourage po 8. Hypertension. Norvasc 10 mg daily, Avapro 75 mg daily. May need adjustment  - with decreased decadron, pressures should level off 9. Constipation/neurogenic bowel: Adjusted bowel program--had bm this morning. Has sense of emptying 10. Hyperlipidemia. Crestor 11. History of gout. Allopurinol 300 mg daily. Monitor for any flareups 12. BPH with history of prostate cancer/hematuria. Flomax 0.4 mg daily, Ditropan 15 mg daily at bedtime.   -emptying fairly frequently. No excess pvrs recorded---need to scan and confirm for retained urine 13. CAD with stenting. Resume aspirin and Plavix -therapy to monitor for symptoms with increased exertion  -friend/md apparently came by and ordered an EKG---I reviewed EKG which showed PAC's which I have auscultated previously on exam. 14. Hyperglycemia: hgb A1C 6.9  -now on carb mod diet  -reducing steroids to 4mg  q8---decrease to q12 tomorrow  -glucophage initiated yesterday  -diabetic education, may need glucose monitor  -will need to follow up with primary   LOS (Days) 4 A FACE TO FACE EVALUATION WAS PERFORMED  Cyril Woodmansee T 12/16/2014 8:09 AM

## 2014-12-16 NOTE — Progress Notes (Signed)
Physical Therapy Session Note  Patient Details  Name: [name] MRN: [id number] Date of Birth: 12/12/1935  Today's Date: 12/16/2014 PT Individual Time: 0906-1000 PT Individual Time Calculation (min): 54 min   Short Term Goals: Week 1:  PT Short Term Goal 1 (Week 1): Pt will be mod I with rolling and transfers supine to edge of bed, edge of bed to supine.  PT Short Term Goal 2 (Week 1): Pt will transfer stand pivot with/without rolling walker and min A. PT Short Term Goal 3 (Week 1): Pt will ambulate with LRAD about 150 feet with min A.  PT Short Term Goal 4 (Week 1): Pt will ascend/descend 12 stairs with B rails and min A.  PT Short Term Goal 5 (Week 1): Pt will propel w/c about 150 feet with mod I.   Skilled Therapeutic Interventions/Progress Updates:   Pt received in bed.  Pt continues to report feeling "groggy" and "foggy" in his head; also reports tension in his neck muscles.  Agreeable to heat at end of session.  Pt transferred to EOB with supervision and donned shoes by crossing each LE; discussed back precautions with pt and PA; will clarify specific back precautions in conference.  Discussed need for equipment; pt reports he may have a RW at home and will have to check with his wife.  Reviewed safe sit <> stand sequence and gait sequence with pt.  Performed gait x 125' x 2 with RW and min-mod A with continued verbal cues for upright posture, full step and stride length, weight shifting and safe navigation with RW.  In gym performed NMR in // bars; see below for details. Pt reporting need to return to have BM.  Pt required min A for balance during toileting tasks in standing and mod-max verbal cues for safety and sequencing.  Continued NMR in room; see below.  Pt left in bed with SR x 3 and bed alarm on with all items within reach and one time use disposable heating pad in place on back of neck.    Therapy Documentation Precautions:  Precautions Precautions: Back, Fall Precaution  Comments: Lifting/pushing/pulling limit; no bending/arching/twisting Restrictions Weight Bearing Restrictions: No  Pain: Pain Assessment Pain Assessment: No/denies pain Other Treatments: Treatments Neuromuscular Facilitation: Right;Left;Lower Extremity;Activity to increase coordination;Activity to increase motor control;Activity to increase timing and sequencing;Activity to increase grading;Activity to increase lateral weight shifting;Activity to increase anterior-posterior weight shifting   See Function Navigator for Current Functional Status.   Therapy/Group: Individual Therapy  Raylene Everts [facility] 12/16/2014, 12:19 PM

## 2014-12-16 NOTE — Progress Notes (Signed)
Occupational Therapy Session Note  Patient Details  Name: [name] MRN: [id number] Date of Birth: 1935-12-26  Today's Date: 12/16/2014 OT Individual Time: 1045-1200 OT Individual Time Calculation (min): 75 min    Short Term Goals: Week 1:  OT Short Term Goal 1 (Week 1): Pt will stand with RW to pull pants over hips with steadying A. OT Short Term Goal 2 (Week 1): Pt will be able to don socks with shoe horn with min cues. OT Short Term Goal 3 (Week 1): Pt will be able to transfer stand pivot to toilet with steadying A. OT Short Term Goal 4 (Week 1): Pt will demonstrate awareness of R foot placement with no cuing during sit to stand.  Skilled Therapeutic Interventions/Progress Updates:    Pt seen for OT therapy session focusing on functional activity tolerance, cognitive re-training, functional mobility, and neuromuscular re-education. Pt in supine upon arrival, declining bathing/dressing tasks, however, agreeable to tx session. He ambulated throughout unit with RW and min A with VCs for safety awareness and coordinating steps. Pt completed scavenger hunt throughout unit, required to remember items to find and look up/ alternate attention while ambulating. He required mod-max questioning cues to remember items on list and min VCs for attention to external environment/ safety hazards. He completed functional transfers during rest breaks from low soft surface couch, able to complete sit <> stand with VCs for technique and close supervision. He then completed sit <> stands at side of parallel bars with emphasis on UE strengthening and muscle activation. Pt with noted R knee hyperextension when coming into standing with tendency to push back on sitting surface when coming into standing.  He returned to room at end of session, agreeable to sitting up in recliner until lunch time. Pt left in recliner with all needs in reach. Educated throughout session regarding need for assist, safety awareness,  and d/c planning.   Therapy Documentation Precautions:  Precautions Precautions: Fall Precaution Comments: laminectomy, ataxia Restrictions Weight Bearing Restrictions: No Pain: Pain Assessment Pain Assessment: No/denies pain  See Function Navigator for Current Functional Status.   Therapy/Group: Individual Therapy  Lewis, Jeremiyah Cullens C 12/16/2014, 7:17 AM

## 2014-12-16 NOTE — Progress Notes (Signed)
Social Work Patient ID: [name], male   DOB: April 06, 1935, 79 y.o.   MRN: [id number]  Met with pt and wife following team conference.  Both aware and agreeable with targeted d/c date of 9/22 with supervision/ mod i goals.  Both pleased with gains so far.  Discussed team concern that pt showing impulsivity at times which affects his safety.  Wife quickly agrees and provides an example of his attempt to get out of bed and walk to bathroom yesterday evening when she was present.  She then chides him for this behavior.  Have encouraged her to be here during tx throughout his stay.  She appears very supportive and aware of the concerns.  Odin Mariani, LCSW

## 2014-12-16 NOTE — Patient Care Conference (Signed)
Inpatient RehabilitationTeam Conference and Plan of Care Update Date: 12/16/2014   Time: 2:15  PM    Patient Name: [name]      Medical Record Number: [id number]  Date of Birth: Apr 13, 1935 Sex: Male         Room/Bed: 4M06C/4M06C-01 Payor Info: Payor: MEDICARE / Plan: MEDICARE PART A AND B / Product Type: *No Product type* /    Admitting Diagnosis: STERN THORACIC EPENDYMOMA  Admit Date/Time:  12/12/2014  4:12 PM Admission Comments: No comment available   Primary Diagnosis:  Ependymoma of spinal cord Principal Problem: Ependymoma of spinal cord  Patient Active Problem List   Diagnosis Date Noted  . Diabetes type 2, uncontrolled   . Thoracic spine tumor 12/12/2014  . Neurogenic bowel 12/12/2014  . Incomplete paraplegia 12/12/2014  . Back pain 12/09/2014  . Malignant tumor spinal cord 12/09/2014  . Pre-operative cardiovascular examination 09/17/2014  . Other fatigue 09/17/2014  . Chest pain with moderate risk for cardiac etiology 09/17/2014  . Ependymoma of spinal cord 11/01/2013  . Unspecified hereditary and idiopathic peripheral neuropathy 11/01/2013  . Diastolic dysfunction without heart failure   . Obesity (BMI 30-39.9) 03/22/2013  . OSA on CPAP 03/19/2012  . H/O prostate cancer 03/19/2012  . Essential hypertension 03/19/2012  . Hyperlipidemia LDL goal <70 03/19/2012  . H/O hydrocele 03/19/2012  . Hx of melanoma excision 03/19/2012  . Personal history of colonic polyps 03/19/2012  . CAD S/P percutaneous coronary angioplasty: Cypher DES x2 to circumflex 08/17/2004    Expected Discharge Date: Expected Discharge Date: 12/25/14  Team Members Present: Physician leading conference: Dr. Alger Simons Social Worker Present: Lennart Pall, LCSW Nurse Present: Heather Roberts, RN PT Present: Raylene Everts, PT OT Present: Napoleon Form, OT SLP Present: Weston Anna, SLP PPS Coordinator present : Ileana Ladd, PT     Current Status/Progress Goal Weekly Team Focus  Medical   thoracic ependymoma with myelopathy, neurogenic bowel and bladder  improve focus/concentration  safety, balance   Bowel/Bladder   continent of bowel and bladder; LBM  9/12  remain continent and no issues with constipaiton  minimal assist   Swallow/Nutrition/ Hydration             ADL's   Steadying assist bathing/ dressing, Min A functional transfers, max VCs for safety awareness during functional tasks.   Overall supervision- mod I  Standing balance, functional ambulation, functional transfers   Mobility   Min-mod A overall; very impulsive   mod I transfers, supervision gait and stairs; may downgrade to all supervision if safety issues continue  Safety, coordination and motor control; gait, balance   Communication             Safety/Cognition/ Behavioral Observations            Pain   no c/o of pain  <3 on a 0-10 pain scale  assess pain q 4hr and PRN   Skin   incision to back with honeycomb dressing in place  no new skin breakdown/infection while on rehab  assess skin q shift and PRN    Rehab Goals Patient on target to meet rehab goals: Yes *See Care Plan and progress notes for long and short-term goals.  Barriers to Discharge: impulsivity, decreased proprioception    Possible Resolutions to Barriers:  supervision    Discharge Planning/Teaching Needs:  home with wife able to provide 24/7 assistance      Team Discussion:  Medically doing well.  Clarifying back precautions with MD.  Pt showing  impulsivity which is affecting his safety.  Want wife to come in during tx to see this concern first hand.  Supervision/ mod i goals overall  Revisions to Treatment Plan:  None   Continued Need for Acute Rehabilitation Level of Care: The patient requires daily medical management by a physician with specialized training in physical medicine and rehabilitation for the following conditions: Daily direction of a multidisciplinary physical rehabilitation program to ensure safe treatment  while eliciting the highest outcome that is of practical value to the patient.: Yes Daily medical management of patient stability for increased activity during participation in an intensive rehabilitation regime.: Yes Daily analysis of laboratory values and/or radiology reports with any subsequent need for medication adjustment of medical intervention for : Post surgical problems;Neurological problems  [name] 12/16/2014, 5:08 PM

## 2014-12-17 ENCOUNTER — Inpatient Hospital Stay (HOSPITAL_COMMUNITY): Payer: Medicare Other | Admitting: Occupational Therapy

## 2014-12-17 ENCOUNTER — Inpatient Hospital Stay (HOSPITAL_COMMUNITY): Payer: Medicare Other | Admitting: Physical Therapy

## 2014-12-17 DIAGNOSIS — I951 Orthostatic hypotension: Secondary | ICD-10-CM

## 2014-12-17 LAB — GLUCOSE, CAPILLARY
Glucose-Capillary: 209 mg/dL — ABNORMAL HIGH (ref 65–99)
Glucose-Capillary: 221 mg/dL — ABNORMAL HIGH (ref 65–99)
Glucose-Capillary: 249 mg/dL — ABNORMAL HIGH (ref 65–99)
Glucose-Capillary: 162 mg/dL — ABNORMAL HIGH (ref 65–99)
Glucose-Capillary: 202 mg/dL — ABNORMAL HIGH (ref 65–99)

## 2014-12-17 MED ORDER — DEXAMETHASONE 4 MG PO TABS
4.0000 mg | ORAL_TABLET | Freq: Two times a day (BID) | ORAL | Status: DC
  Administered 2014-12-17 – 2014-12-18 (×3): 4 mg via ORAL
  Filled 2014-12-17 (×3): qty 1

## 2014-12-17 NOTE — Progress Notes (Signed)
[facility] PHYSICAL MEDICINE & REHABILITATION     PROGRESS NOTE    Subjective/Complaints: Just waking up. Has a list of questions for me. States he's had some dizziness when he's standing up, sometimes sitting. ROS: Pt denies fever, rash/itching, headache, blurred or double vision, nausea, vomiting, abdominal pain, diarrhea, chest pain, shortness of breath, palpitations, dysuria,   bleeding, anxiety, or depression   Objective: Vital Signs: Blood pressure 147/65, pulse 55, temperature 98 F (36.7 C), temperature source Oral, resp. rate 20, height 5\' 9"  (1.753 m), weight 101.9 kg (224 lb 10.4 oz), SpO2 96 %. No results found.  Recent Labs  12/15/14 0559  WBC 9.4  HGB 15.5  HCT 45.3  PLT 189    Recent Labs  12/15/14 0559  NA 136  K 3.7  CL 103  GLUCOSE 134*  BUN 15  CREATININE 0.98  CALCIUM 9.2   CBG (last 3)   Recent Labs  12/16/14 1702 12/16/14 2057 12/17/14 0630  GLUCAP 152* 209* 221*    Wt Readings from Last 3 Encounters:  12/12/14 101.9 kg (224 lb 10.4 oz)  12/11/14 102.649 kg (226 lb 4.8 oz)  12/01/14 99.474 kg (219 lb 4.8 oz)    Physical Exam:  Constitutional: He is oriented to person, place, and time. He appears well-developed.  HENT: oral mucosa pink and moist Head: Normocephalic.  Eyes: EOM are normal.  Neck: Normal range of motion. Neck supple. No thyromegaly present.  Cardiovascular: Normal rate and regular rhythm. rare pac if any today Respiratory: Effort normal and breath sounds normal. No respiratory distress. No wheezes or rales GI: Soft. Bowel sounds are normal. He exhibits no distension.  Neurological: He is alert and oriented to person, place, and time.  Continued diminished LT/PP in bilateral lower extremities from upper thigh and distally. Decreased gross coordination of both legs, right more than left. RLE, mild limb ataxia: 4/5 hf,   4-ke and 3+ to 4- adf/apf. LLE: 4/5 hf, 4ke and 4-adf/apf. Bilateral UE: 5/5 deltoid, bicep,  tricpe, wrist, HI. Marland Kitchen Cognitively he's appropriate with normal insight and awareness.   Skin:  Incision site is clean and dry ---honeycomb dressing still in place Psychiatric: He has a normal mood and affect. His behavior is normal  Assessment/Plan: 1. 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 Physiatrist is providing close team supervision and 24 hour management of active medical problems listed below. Physiatrist and rehab team continue to assess barriers to discharge/monitor patient progress toward functional and medical goals.  Function:  Bathing Bathing position   Position: Shower  Bathing parts Body parts bathed by patient: Right arm, Left arm, Chest, Abdomen, Front perineal area, Buttocks, Right upper leg, Left upper leg, Right lower leg, Left lower leg, Back    Bathing assist Assist Level: Touching or steadying assistance(Pt > 75%)      Upper Body Dressing/Undressing Upper body dressing   What is the patient wearing?: Pull over shirt/dress     Pull over shirt/dress - Perfomed by patient: Thread/unthread right sleeve, Thread/unthread left sleeve, Put head through opening, Pull shirt over trunk          Upper body assist Assist Level: More than reasonable time      Lower Body Dressing/Undressing Lower body dressing   What is the patient wearing?: Underwear, Pants, Socks, Shoes Underwear - Performed by patient: Thread/unthread right underwear leg, Thread/unthread left underwear leg,  Pull underwear up/down   Pants- Performed by patient: Thread/unthread right pants leg, Thread/unthread left pants leg, Pull pants up/down         Socks - Performed by helper: Don/doff right sock, Don/doff left sock Shoes - Performed by patient: Don/doff right shoe, Don/doff left shoe, Fasten right, Fasten left Shoes - Performed by  helper: Don/doff right shoe, Don/doff left shoe, Fasten right, Fasten left          Lower body assist Assist Level: Touching or steadying assistance (Pt > 75%)      Toileting Toileting Toileting activity did not occur: N/A Toileting steps completed by patient: Adjust clothing prior to toileting, Performs perineal hygiene, Adjust clothing after toileting   Toileting Assistive Devices: Grab bar or rail  Toileting assist Assist level: Touching or steadying assistance (Pt.75%)   Transfers Chair/bed transfer   Chair/bed transfer method: Ambulatory, Stand pivot Chair/bed transfer assist level: Touching or steadying assistance (Pt > 75%) Chair/bed transfer assistive device: Medical sales representative     Max distance: 150 Assist level: Touching or steadying assistance (Pt > 75%)   Wheelchair Wheelchair activity did not occur: N/A Type: Manual Max wheelchair distance: 150 Assist Level: Supervision or verbal cues  Cognition Comprehension Comprehension assist level: Understands complex 90% of the time/cues 10% of the time  Expression Expression assist level: Expresses complex 90% of the time/cues < 10% of the time  Social Interaction Social Interaction assist level: Interacts appropriately with others with medication or extra time (anti-anxiety, antidepressant).  Problem Solving Problem solving assist level: Solves basic 90% of the time/requires cueing < 10% of the time  Memory Memory assist level: Recognizes or recalls 90% of the time/requires cueing < 10% of the time   Medical Problem List and Plan: 1. 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 -continue CIR therapies. Supervision goals 2. DVT Prophylaxis/Anticoagulation: SCDs. ambulation 3. Pain Management: Hydrocodone and Robaxin as needed. Monitor with increased mobility 4. Mood: Lexapro 10 mg daily. Provide emotional support 5.  Neuropsych: This patient is capable of making decisions on his own behalf. 6. Skin/Wound Care: surgical wound healing nicely. 7. Fluids/Electrolytes/Nutrition: all labs personally reviewed today--continue to encourage po 8. Hypertension. Norvasc 10 mg daily, Avapro 75 mg daily. bp's with improvement recently 9. Constipation/neurogenic bowel: Adjusted bowel program--had bm this morning. Has sense of emptying 10. Hyperlipidemia. Crestor 11. History of gout. Allopurinol 300 mg daily. Monitor for any flareups 12. BPH with history of prostate cancer/hematuria. Flomax 0.4 mg daily, Ditropan 15 mg daily at bedtime.   -emptying fairly frequently but not far off baseline 13. CAD with stenting. Resume aspirin and Plavix -therapy to monitor for symptoms with increased exertion  - EKG with PAC's--explained at length to pt/wife 14. Hyperglycemia: hgb A1C 6.9  -now on carb mod diet  -reduce decadron to 4mg    q12 today  -glucophage initiated---sugars showing some improvement  -diabetic education, will need glucose monitor  -will need to follow up with primary  -in speaking with his wife, he's been "in denial" regarding dm for awhile 15. Dizziness: likely volume/med related. Asked him to take extra time when moving from lying to sitting to standing----he's impulsive.  -check orthostatic VS also   LOS (Days) 5 A FACE TO FACE EVALUATION WAS PERFORMED  Nancyann Cotterman T 12/17/2014 8:08 AM

## 2014-12-17 NOTE — Progress Notes (Signed)
Occupational Therapy Session Note  Patient Details  Name: [name] MRN: [id number] Date of Birth: Jul 27, 1935  Today's Date: 12/17/2014 OT Individual Time: 1403-1500 OT Individual Time Calculation (min): 57 min    Skilled Therapeutic Interventions/Progress Updates:    Pt ambulated to the OT gym to begin session.  Slightly flexed posture noted with pt looking at the ground secondary to proprioceptive impairments in his LEs.  Pt needing mod instructional cueing to slow down his speed.  Wife also accompanied pt as well.  Transferred to therapy mat with focus of session on sit to squat and squat to stand transitions to increased RLE timing, coordination, and proprioception.  Had pt perform sit to stand while reaching for clothes pins and placing them on the vertical grid.  He needed mod instructional cueing to check placement of his right foot before standing as it would be too far under him and he could not tell it.  Therapist provided min facilitation for right knee control and to help prevent hyper-extension of the knee once standing.  Transitioned to Nustep for continuation of LE strengthening.  Pt performed 8 mins on level 6 resistance.  Noted pt needed mod assist to keep the RLE on the foot plate as it would continually slide off.  The left foot would also do this but only occasionally compared to right.  Pt ambulated back to the room and sat in bedside chair with call button and phone within reach.  [name] and family friend present.  Instructed pt to not get out of chair without assistance.  Family states they will not let him and will call for assistance when he is ready to get back in the bed.   Therapy Documentation Precautions:  Precautions Precautions: Back, Fall Precaution Comments: Lifting/pushing/pulling limit; no bending/arching/twisting Restrictions Weight Bearing Restrictions: No  Pain: Pain Assessment Pain Assessment: No/denies pain ADL: See Function Navigator for  Current Functional Status.   Therapy/Group: Individual Therapy  Rockford Leinen OTR/L 12/17/2014, 3:52 PM

## 2014-12-17 NOTE — Progress Notes (Signed)
Social Work Patient ID: [name], male   DOB: 12-Apr-1935, 79 y.o.   MRN: [id number]   Lowella Curb, LCSW Social Worker Signed  Patient Care Conference 12/16/2014  5:08 PM    Expand All Collapse All   Inpatient RehabilitationTeam Conference and Plan of Care Update Date: 12/16/2014   Time: 2:15  PM     Patient Name: [name]       Medical Record Number: [id number]  Date of Birth: [birth year] Sex: Male         Room/Bed: 4M06C/4M06C-01 Payor Info: Payor: MEDICARE / Plan: MEDICARE PART A AND B / Product Type: *No Product type* /    Admitting Diagnosis: STERN THORACIC EPENDYMOMA   Admit Date/Time:  12/12/2014  4:12 PM Admission Comments: No comment available   Primary Diagnosis:  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  Diabetes type 2, uncontrolled     .  Thoracic spine tumor  12/12/2014   .  Neurogenic bowel  12/12/2014   .  Incomplete paraplegia  12/12/2014   .  Back pain  12/09/2014   .  Malignant tumor spinal cord  12/09/2014   .  Pre-operative cardiovascular examination  09/17/2014   .  Other fatigue  09/17/2014   .  Chest pain with moderate risk for cardiac etiology  09/17/2014   .  Ependymoma of spinal cord  11/01/2013   .  Unspecified hereditary and idiopathic peripheral neuropathy  11/01/2013   .  Diastolic dysfunction without heart failure     .  Obesity (BMI 30-39.9)  03/22/2013   .  OSA on CPAP  03/19/2012   .  H/O prostate cancer  03/19/2012   .  Essential hypertension  03/19/2012   .  Hyperlipidemia LDL goal <70  03/19/2012   .  H/O hydrocele  03/19/2012   .  Hx of melanoma excision  03/19/2012   .  Personal history of colonic polyps  03/19/2012   .  CAD S/P percutaneous coronary angioplasty: Cypher DES x2 to circumflex  08/17/2004     Expected Discharge Date: Expected Discharge Date: 12/25/14  Team Members Present: Physician leading conference: Dr. Alger Simons Social Worker Present: Lennart Pall, LCSW Nurse Present: Heather Roberts, RN PT Present: Raylene Everts, PT OT Present: Napoleon Form, OT SLP Present: Weston Anna, SLP PPS Coordinator present : Ileana Ladd, PT        Current Status/Progress  Goal  Weekly Team Focus   Medical     thoracic ependymoma with myelopathy, neurogenic bowel and bladder  improve focus/concentration  safety, balance   Bowel/Bladder     continent of bowel and bladder; LBM  9/12  remain continent and no issues with constipaiton  minimal assist   Swallow/Nutrition/ Hydration               ADL's     Steadying assist bathing/ dressing, Min A functional transfers, max VCs for safety awareness during functional tasks.   Overall supervision- mod I  Standing balance, functional ambulation, functional transfers    Mobility     Min-mod A overall; very impulsive    mod I transfers, supervision gait and stairs; may downgrade to all supervision if safety issues continue  Safety, coordination and motor control; gait, balance    Communication  Safety/Cognition/ Behavioral Observations              Pain     no c/o of pain  <3 on a 0-10 pain scale  assess pain q 4hr and PRN   Skin     incision to back with honeycomb dressing in place   no new skin breakdown/infection while on rehab   assess skin q shift and PRN    Rehab Goals Patient on target to meet rehab goals: Yes *See Care Plan and progress notes for long and short-term goals.    Barriers to Discharge:  impulsivity, decreased proprioception      Possible Resolutions to Barriers:   supervision     Discharge Planning/Teaching Needs:   home with wife able to provide 24/7 assistance        Team Discussion:    Medically doing well.  Clarifying back precautions with MD.  Pt showing impulsivity which is affecting his safety.  Want wife to come in during tx to see this concern first hand.  Supervision/ mod i goals overall   Revisions to Treatment Plan:     None    Continued Need for Acute Rehabilitation Level of Care: The patient requires daily medical management by a physician with specialized training in physical medicine and rehabilitation for the following conditions: Daily direction of a multidisciplinary physical rehabilitation program to ensure safe treatment while eliciting the highest outcome that is of practical value to the patient.: Yes Daily medical management of patient stability for increased activity during participation in an intensive rehabilitation regime.: Yes Daily analysis of laboratory values and/or radiology reports with any subsequent need for medication adjustment of medical intervention for : Post surgical problems;Neurological problems  Avyan Livesay 12/16/2014, 5:08 PM

## 2014-12-17 NOTE — Progress Notes (Signed)
Occupational Therapy Session Note  Patient Details  Name: [name] MRN: [id number] Date of Birth: [birth year]  Today's Date: 12/17/2014 OT Individual Time: 0830-1000 OT Individual Time Calculation (min): 90 min    Short Term Goals: Week 1:  OT Short Term Goal 1 (Week 1): Pt will stand with RW to pull pants over hips with steadying A. OT Short Term Goal 2 (Week 1): Pt will be able to don socks with shoe horn with min cues. OT Short Term Goal 3 (Week 1): Pt will be able to transfer stand pivot to toilet with steadying A. OT Short Term Goal 4 (Week 1): Pt will demonstrate awareness of R foot placement with no cuing during sit to stand.  Skilled Therapeutic Interventions/Progress Updates:    Pt seen for OT ADL bathing and dressing session. Pt in supine upon arrival, agreeable to tx session. He transferred to EOB with supervision, cont with impulsive mobility. He ambulated throughout room and completed functional transfers with supervision- steadying assist. He bathed seated on shower chair with supervision and steadying assist when standing to complete buttock hygiene. He dressed seated EOB standing at RW with close supervision to pull pants up.  He ambulated throughout unit with RW and min steadying assist with VCs for step cadence as pt with increased ambulation/ step speed. In ADL apartment, pt completed simulated shower stall transfer using RW and steadying assist and VCs for technique. Pt without recall of shower stall technqiue taught in previous session. He completed sit <> stand transfers from low soft surface couch with supervision.  In therapy gym, he completed multiple sit <> stand transfers from therapy mat, beginning with mat high and progressing to low mat surface setting. Supervision- min steadying assist required for sit <> stand with pt with tendency of hyper extension of R knee and pushing back with R knee against sitting surface when completing transfer.  Attempted to have pt  stand on foam block, however, pt demonstrated decreased ability to step up for clearance. He then completed leg lifts in sitting, completing toe taps on aeorbics step. He completed alterating steps in standing with mod-max steadying assist and VCs for safety awareness.  He completed toe tapping exercises in standing initially with RW and min steadying assist progressing to no AE and mod steadying assist with emphasis on controlled movements of LE and regaining balance during functional mobility. Max cuing required to regain balance after each trial. Education provided regarding importance of visual inspection of foot placement due to decreased proprioception, pt voiced agreement and understanding.  He required VCs throughout session to slow down with all aspects of functional tasks, ambulation, and therapy exercises. Pt able to verbalize need to slow down, however, unable to do so during functional tasks and exercises.  Pt ambulated back to room at end of session, left sitting on EOB in prep for PT session, all needs in reach.  Pt complained of "lightheadedness" during session, BP was 125/68, and had no s/s of distress.    Therapy Documentation Precautions:  Precautions Precautions: Back, Fall Precaution Comments: Lifting/pushing/pulling limit; no bending/arching/twisting Restrictions Weight Bearing Restrictions: No Pain: Pain Assessment Pain Assessment: No/denies pain  See Function Navigator for Current Functional Status.   Therapy/Group: Individual Therapy  Lewis, Lam Bjorklund C 12/17/2014, 7:12 AM

## 2014-12-17 NOTE — Progress Notes (Signed)
Physical Therapy Session Note  Patient Details  Name: [name] MRN: [id number] Date of Birth: April 02, 1936  Today's Date: 12/17/2014 PT Individual Time: 1000-1100 PT Individual Time Calculation (min): 60 min   Short Term Goals: Week 1:  PT Short Term Goal 1 (Week 1): Pt will be mod I with rolling and transfers supine to edge of bed, edge of bed to supine.  PT Short Term Goal 2 (Week 1): Pt will transfer stand pivot with/without rolling walker and min A. PT Short Term Goal 3 (Week 1): Pt will ambulate with LRAD about 150 feet with min A.  PT Short Term Goal 4 (Week 1): Pt will ascend/descend 12 stairs with B rails and min A.  PT Short Term Goal 5 (Week 1): Pt will propel w/c about 150 feet with mod I.   Skilled Therapeutic Interventions/Progress Updates:   Session focused on pacing, coordination, controlled movement, safety awareness, awareness of deficits, standing balance, NMR, activity tolerance, and patient education. Patient ambulated using heavy duty RW throughout rehab unit to ortho gym and main gym with overall supervision, mod verbal cues for safety especially with turns and forward gaze. Performed car transfer to sedan height using RW with overall supervision, mod verbal cues for safety and technique. In stairwell, negotiated up/down 2 flights of stairs (22 steps) using both rails with max verbal cues for safe step-to pattern and widening BOS to prevent from stepping on other foot, ascending leading with LLE and descending leading with RLE, min guard overall. In therapy gym, switched out heavy duty RW for regular RW as patient reports having walkers at home on each level. Patient also reported having another rail installed at home in both stairwells to reach all 3 floors. Patient with limited carryover for ensuring safe placement RLE before standing despite max multimodal cues, added sign to RW to increase carryover and safety with sit <> stands. Dynavision in standing, Memory Test 3  for 4 trials: Trial 1 = score of 23, avg reaction time .88 sec, able to read 0 numbers on T-scope, Trial 2 = score of 35, avg reaction time .83 sec, able to read 1 number on T-scope, Trial 3 = score of 38, avg reaction time .81 sec, able to read 4 numbers on T-scope, Trial 4 = score of 38, avg reaction time .78 sec, able to read 5 numbers on T-scope. Patient instructed not to hit green buttons with no errors. Performed NuStep using BUE/BLE at level 8 x 10 min. Patient ambulated back to room using RW with supervision and left seated edge of bed with all needs within reach.      Therapy Documentation Precautions:  Precautions Precautions: Back, Fall Precaution Comments: Lifting/pushing/pulling limit; no bending/arching/twisting Restrictions Weight Bearing Restrictions: No Pain: Pain Assessment Pain Assessment: No/denies pain   See Function Navigator for Current Functional Status.   Therapy/Group: Individual Therapy  Laretta Alstrom 12/17/2014, 1:01 PM

## 2014-12-17 NOTE — Progress Notes (Signed)
Placed patient on CPAP for the night. Patient tolerating well at this time.

## 2014-12-18 ENCOUNTER — Inpatient Hospital Stay (HOSPITAL_COMMUNITY): Payer: Medicare Other | Admitting: Occupational Therapy

## 2014-12-18 ENCOUNTER — Inpatient Hospital Stay (HOSPITAL_COMMUNITY): Payer: Medicare Other

## 2014-12-18 LAB — GLUCOSE, CAPILLARY
Glucose-Capillary: 182 mg/dL — ABNORMAL HIGH (ref 65–99)
Glucose-Capillary: 160 mg/dL — ABNORMAL HIGH (ref 65–99)
Glucose-Capillary: 203 mg/dL — ABNORMAL HIGH (ref 65–99)
Glucose-Capillary: 170 mg/dL — ABNORMAL HIGH (ref 65–99)

## 2014-12-18 NOTE — Progress Notes (Signed)
Physical Therapy Weekly Progress Note  Patient Details  Name: [name] MRN: [id number] Date of Birth: 1935/07/11  Beginning of progress report period: December 13, 2014 End of progress report period: December 19, 2014  Patient has met 4 of 5 short term goals. Pt is making good progress though still limited by the below impairments. Planning for family education early next week to prepare for safe d/c home. Currently pt at overall close S to min A level with RW.  Patient continues to demonstrate the following deficits: decreased balance, decreased proprioception, decreased endurance, decreased  and therefore will continue to benefit from skilled PT intervention to enhance overall performance with activity tolerance, balance, postural control, ability to compensate for deficits, functional use of  right lower extremity and left lower extremity, awareness, coordination and knowledge of precautions.  Patient progressing toward long term goals.  Continue plan of care.  PT Short Term Goals Week 1:  PT Short Term Goal 1 (Week 1): Pt will be mod I with rolling and transfers supine to edge of bed, edge of bed to supine.  PT Short Term Goal 1 - Progress (Week 1): Partly met (cues needed for adhering to precautions) PT Short Term Goal 2 (Week 1): Pt will transfer stand pivot with/without rolling walker and min A. PT Short Term Goal 2 - Progress (Week 1): Met PT Short Term Goal 3 (Week 1): Pt will ambulate with LRAD about 150 feet with min A.  PT Short Term Goal 3 - Progress (Week 1): Met PT Short Term Goal 4 (Week 1): Pt will ascend/descend 12 stairs with B rails and min A.  PT Short Term Goal 4 - Progress (Week 1): Met PT Short Term Goal 5 (Week 1): Pt will propel w/c about 150 feet with mod I.  PT Short Term Goal 5 - Progress (Week 1): Met Week 2:  PT Short Term Goal 1 (Week 2): = LTGs  Skilled Therapeutic Interventions/Progress Updates:  Ambulation/gait training;Discharge  planning;Cognitive remediation/compensation;Balance/vestibular training;DME/adaptive equipment instruction;Functional mobility training;Patient/family education;Neuromuscular re-education;Psychosocial support;Splinting/orthotics;Therapeutic Exercise;Therapeutic Activities;Stair training;UE/LE Strength taining/ROM;UE/LE Coordination activities;Wheelchair propulsion/positioning;Community reintegration;Pain management;Disease management/prevention   Therapy Documentation Precautions:  Precautions Precautions: Back, Fall Precaution Comments: Lifting/pushing/pulling limit; no bending/arching/twisting Restrictions Weight Bearing Restrictions: No  See Function Navigator for Current Functional Status.  Canary Brim Ivory Broad, PT, DPT  12/19/2014, 7:56 AM

## 2014-12-18 NOTE — Progress Notes (Signed)
Physical Therapy Session Note  Patient Details  Name: [name] MRN: [id number] Date of Birth: 1935-12-29  Today's Date: 12/18/2014 PT Individual Time: 0900-1000 PT Individual Time Calculation (min): 60 min   Short Term Goals: Week 1:  PT Short Term Goal 1 (Week 1): Pt will be mod I with rolling and transfers supine to edge of bed, edge of bed to supine.  PT Short Term Goal 2 (Week 1): Pt will transfer stand pivot with/without rolling walker and min A. PT Short Term Goal 3 (Week 1): Pt will ambulate with LRAD about 150 feet with min A.  PT Short Term Goal 4 (Week 1): Pt will ascend/descend 12 stairs with B rails and min A.  PT Short Term Goal 5 (Week 1): Pt will propel w/c about 150 feet with mod I.   Skilled Therapeutic Interventions/Progress Updates:    Session focused on neuro re-ed to address functional balance, gait training with RW with focus on slowing gait speed, attention to foot placement, and overall safety with AD, dynamic gait through obstacle course to work on safety and awareness to simulate home and community mobility (close S to min A with episode of LOB) stepping over thershold and navigating turns (increased cues needed for turning), and overall safety with mobility during functional tasks and transfers. Worked on neuro re-ed for sit to stand retraining, dynamic standing balance and reaching and squatting without UE support despite pt attempting to hold on throughout (min to mod A) and alternating toe taps on step with min to mod A with verbal cues and manual facilitation for weightshifting.   Therapy Documentation Precautions:  Precautions Precautions: Back, Fall Precaution Comments: Lifting/pushing/pulling limit; no bending/arching/twisting Restrictions Weight Bearing Restrictions: No  Pain:  Denies pain.   See Function Navigator for Current Functional Status.   Therapy/Group: Individual Therapy  Canary Brim Ivory Broad, PT,  DPT  12/18/2014, 11:05 AM

## 2014-12-18 NOTE — Progress Notes (Signed)
Occupational Therapy Session Note  Patient Details  Name: [name] MRN: [id number] Date of Birth: 01-Apr-1936  Today's Date: 12/18/2014 OT Individual Time: 1030-1130 and 1430-1500 OT Individual Time Calculation (min): 60 min and 30 min   Short Term Goals: Week 1:  OT Short Term Goal 1 (Week 1): Pt will stand with RW to pull pants over hips with steadying A. OT Short Term Goal 2 (Week 1): Pt will be able to don socks with shoe horn with min cues. OT Short Term Goal 3 (Week 1): Pt will be able to transfer stand pivot to toilet with steadying A. OT Short Term Goal 4 (Week 1): Pt will demonstrate awareness of R foot placement with no cuing during sit to stand.  Skilled Therapeutic Interventions/Progress Updates:    Session One: Pt seen for OT therapy session focusing on neuromuscular re-education and functional activity tolerance. Pt in supine upon arrival, declining bathing/dressing session, however, agreeable to tx session. Pt ambulated throughout unit to ADL apartment. He participated in Wii video games completed in standing focusing on weight shifts and functional balance. Pt required initially min-mod steadying assist, however, once fatigued required max assist for static standing as pt with strong posterior lean and unable to regain balance. Max VCs required for weight shifting to obtain midline stance/ balance. Pt tolerates ~1-2 minutes of standing before demonstrating signs of fatigue, however, does not verbalize need for rest break. Extensive education provided regarding energy conservation techniques, and need for rest breaks.  Mod A required for pt to take step onto ~2 inch balance board required for Wii gaming system.    Pt cont to require mod-max VCs to slow down ambulation and to pace self through activities. Pt with decreased balance strategies when moving uickly, however, when he slows down he is able to ambulate and transfer with min- close supervision while using RE.   Session  Two: Pt sitting on EOM upon arrival with hand off from PT. Pt ambulated throughout hospital to hospital gift store with RW and close supervision. Pt demonstrated good carry over of education for techniques to keep pace and stride length when ambulating. He was able to navigate through tight walkways and over uneven terrain when ambulating through gift store and on outside sidewalk. He was able to navigate back to unit without need for cues for directions. Pt returned to room and sat EOB to complete UE strengthening exercises using level II theraband. VCs and demonstrational cues provided for proper form and technique. Pt returned to supine at end of session, left with all needs in reach and educated regarding use of call bell for assist.   Therapy Documentation Precautions:  Precautions Precautions: Back, Fall Precaution Comments: Lifting/pushing/pulling limit; no bending/arching/twisting Restrictions Weight Bearing Restrictions: No Pain:   No/ denies pain  See Function Navigator for Current Functional Status.   Therapy/Group: Individual Therapy  Lewis, Domenick Quebedeaux C 12/18/2014, 7:12 AM

## 2014-12-18 NOTE — Progress Notes (Signed)
[facility] [facility] PHYSICAL MEDICINE & REHABILITATION     PROGRESS NOTE    Subjective/Complaints: Just waking up. Has a list of questions for me. States he's had some dizziness when he's standing up, sometimes sitting. ROS: Pt denies fever, rash/itching, headache, blurred or double vision, nausea, vomiting, abdominal pain, diarrhea, chest pain, shortness of breath, palpitations, dysuria,   bleeding, anxiety, or depression   Objective: Vital Signs: Blood pressure 169/72, pulse 48, temperature 98.3 F (36.8 C), temperature source Oral, resp. rate 18, height 5\' 9"  (1.753 m), weight 101.9 kg (224 lb 10.4 oz), SpO2 95 %. No results found. No results for input(s): WBC, HGB, HCT, PLT in the last 72 hours. No results for input(s): NA, K, CL, GLUCOSE, BUN, CREATININE, CALCIUM in the last 72 hours.  Invalid input(s): CO CBG (last 3)   Recent Labs  12/17/14 1643 12/17/14 2046 12/18/14 0607  GLUCAP 162* 202* 182*    Wt Readings from Last 3 Encounters:  12/12/14 101.9 kg (224 lb 10.4 oz)  12/11/14 102.649 kg (226 lb 4.8 oz)  12/01/14 99.474 kg (219 lb 4.8 oz)    Physical Exam:  Constitutional: He is oriented to person, place, and time. He appears well-developed.  HENT: oral mucosa pink and moist Head: Normocephalic.  Eyes: EOM are normal.  Neck: Normal range of motion. Neck supple. No thyromegaly present.  Cardiovascular: Normal rate and regular rhythm. rare pac if any today Respiratory: Effort normal and breath sounds normal. No respiratory distress. No wheezes or rales GI: Soft. Bowel sounds are normal. He exhibits no distension.  Neurological: He is alert and oriented to person, place, and time.  Continued diminished LT/PP in bilateral lower extremities from upper thigh and distally. Decreased gross coordination of both legs, right more than left. RLE, mild limb ataxia: 4/5 hf,   4-ke and 3+ to 4- adf/apf. LLE: 4/5 hf, 4ke and 4-adf/apf. Bilateral UE: 5/5 deltoid, bicep, tricpe,  wrist, HI. Marland Kitchen Cognitively he's appropriate with normal insight and awareness.   Skin:  Incision site is clean and dry ---honeycomb dressing still in place Psychiatric: He has a normal mood and affect. His behavior is normal  Assessment/Plan: 1. 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 Physiatrist is providing close team supervision and 24 hour management of active medical problems listed below. Physiatrist and rehab team continue to assess barriers to discharge/monitor patient progress toward functional and medical goals.  Function:  Bathing Bathing position   Position: Shower  Bathing parts Body parts bathed by patient: Right arm, Left arm, Chest, Abdomen, Front perineal area, Buttocks, Right upper leg, Left upper leg, Right lower leg, Left lower leg, Back    Bathing assist Assist Level: Touching or steadying assistance(Pt > 75%)      Upper Body Dressing/Undressing Upper body dressing   What is the patient wearing?: Pull over shirt/dress     Pull over shirt/dress - Perfomed by patient: Thread/unthread right sleeve, Thread/unthread left sleeve, Put head through opening, Pull shirt over trunk          Upper body assist Assist Level: Set up      Lower Body Dressing/Undressing Lower body dressing   What is the patient wearing?: Underwear, Pants, Socks, Shoes Underwear - Performed by patient: Thread/unthread right underwear leg, Thread/unthread left underwear leg, Pull underwear up/down   Pants- Performed by patient: Thread/unthread right pants leg, Thread/unthread left pants leg, Pull  pants up/down       Socks - Performed by patient: Don/doff right sock, Don/doff left sock Socks - Performed by helper: Don/doff right sock, Don/doff left sock Shoes - Performed by patient: Don/doff right shoe, Don/doff left shoe, Fasten  right, Fasten left Shoes - Performed by helper: Don/doff right shoe, Don/doff left shoe, Fasten right, Fasten left          Lower body assist Assist Level: Touching or steadying assistance (Pt > 75%)      Toileting Toileting Toileting activity did not occur: N/A Toileting steps completed by patient: Adjust clothing prior to toileting, Performs perineal hygiene, Adjust clothing after toileting   Toileting Assistive Devices: Grab bar or rail  Toileting assist Assist level: Supervision or verbal cues   Transfers Chair/bed transfer   Chair/bed transfer method: Ambulatory, Stand pivot Chair/bed transfer assist level: Supervision or verbal cues Chair/bed transfer assistive device: Environmental consultant, Air cabin crew     Max distance: 150 ft Assist level: Supervision or verbal cues   Wheelchair Wheelchair activity did not occur: N/A Type: Manual Max wheelchair distance: 150 Assist Level: Supervision or verbal cues  Cognition Comprehension Comprehension assist level: Understands complex 90% of the time/cues 10% of the time  Expression Expression assist level: Expresses complex ideas: With extra time/assistive device  Social Interaction Social Interaction assist level: Interacts appropriately 90% of the time - Needs monitoring or encouragement for participation or interaction.  Problem Solving Problem solving assist level: Solves complex 90% of the time/cues < 10% of the time  Memory Memory assist level: Recognizes or recalls 90% of the time/requires cueing < 10% of the time   Medical Problem List and Plan: 1. 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 -continue CIR therapies. Supervision goals 2. DVT Prophylaxis/Anticoagulation: SCDs. ambulation 3. Pain Management: Hydrocodone and Robaxin as needed. Monitor with increased mobility 4. Mood: Lexapro 10 mg daily. Provide emotional support 5.  Neuropsych: This patient is capable of making decisions on his own behalf. 6. Skin/Wound Care: surgical wound healing nicely. 7. Fluids/Electrolytes/Nutrition: encourage po 8. Hypertension. Norvasc 10 mg daily, Avapro 75 mg daily. bp's with improvement recently 9. Constipation/neurogenic bowel: Adjusted bowel program--had bm this morning. Has sense of emptying 10. Hyperlipidemia. Crestor 11. History of gout. Allopurinol 300 mg daily. Monitor for any flareups 12. BPH with history of prostate cancer/hematuria. Flomax 0.4 mg daily, Ditropan 15 mg daily at bedtime.   -emptying fairly frequently but not far off baseline 13. CAD with stenting. Resume aspirin and Plavix -therapy to monitor for symptoms with increased exertion  - EKG with PAC's as on exam 14. Hyperglycemia: hgb A1C 6.9  -now on carb mod diet  -reduced decadron to 4mg    q12    -glucophage initiated  -diabetic education, will need glucose monitor  -will need to follow up with primary  -in speaking with his wife, he's been "in denial" regarding dm for awhile  -sugars showing improvement 15. Dizziness: likely volume/med related. Asked him to take extra time when moving from lying to sitting to standing----he's impulsive.  -  orthostatic VS negative===dc   LOS (Days) 6 A FACE TO FACE EVALUATION WAS PERFORMED  Germaine Shenker T 12/18/2014 7:51 AM

## 2014-12-18 NOTE — Progress Notes (Signed)
Physical Therapy Session Note  Patient Details  Name: [name] MRN: [id number] Date of Birth: [birth year]  Today's Date: 12/18/2014 PT Individual Time: 1330-1430 PT Individual Time Calculation (min): 60 min   Short Term Goals: Week 1:  PT Short Term Goal 1 (Week 1): Pt will be mod I with rolling and transfers supine to edge of bed, edge of bed to supine.  PT Short Term Goal 2 (Week 1): Pt will transfer stand pivot with/without rolling walker and min A. PT Short Term Goal 3 (Week 1): Pt will ambulate with LRAD about 150 feet with min A.  PT Short Term Goal 4 (Week 1): Pt will ascend/descend 12 stairs with B rails and min A.  PT Short Term Goal 5 (Week 1): Pt will propel w/c about 150 feet with mod I.   Skilled Therapeutic Interventions/Progress Updates:    Session focused on functional gait training with RW to/from therapy gym with focus on gait quality, speed (slowing down), foot placement, and overall safety with mobility with close S/steady A, neuro re-ed on Kinetron in standing for functional strengthening, reciprocal movement pattern training, postural control (limiting UE use), and balance training (maintaining balance while throwing horseshoes without UE support), gait without AD in parallel bars as well as other balance activities (perfomed various tap steps with cues for safety)), stair negotiation training for home entry simulation x 8 steps with cues for which foot to lead with, and addressed overall endurance and educated on energy conservation techniques.  Therapy Documentation Precautions:  Precautions Precautions: Back, Fall Precaution Comments: Lifting/pushing/pulling limit; no bending/arching/twisting Restrictions Weight Bearing Restrictions: No   Pain:  Denies pain.   See Function Navigator for Current Functional Status.   Therapy/Group: Individual Therapy  Canary Brim Ivory Broad, PT, DPT  12/18/2014, 2:45 PM

## 2014-12-19 ENCOUNTER — Inpatient Hospital Stay (HOSPITAL_COMMUNITY): Payer: Medicare Other

## 2014-12-19 ENCOUNTER — Inpatient Hospital Stay (HOSPITAL_COMMUNITY): Payer: Medicare Other | Admitting: Occupational Therapy

## 2014-12-19 LAB — GLUCOSE, CAPILLARY
Glucose-Capillary: 212 mg/dL — ABNORMAL HIGH (ref 65–99)
Glucose-Capillary: 134 mg/dL — ABNORMAL HIGH (ref 65–99)
Glucose-Capillary: 222 mg/dL — ABNORMAL HIGH (ref 65–99)
Glucose-Capillary: 259 mg/dL — ABNORMAL HIGH (ref 65–99)

## 2014-12-19 MED ORDER — DEXAMETHASONE 2 MG PO TABS
2.0000 mg | ORAL_TABLET | Freq: Two times a day (BID) | ORAL | Status: DC
  Administered 2014-12-21 – 2014-12-23 (×6): 2 mg via ORAL
  Filled 2014-12-19 (×6): qty 1

## 2014-12-19 MED ORDER — DEXAMETHASONE 4 MG PO TABS
4.0000 mg | ORAL_TABLET | Freq: Two times a day (BID) | ORAL | Status: AC
  Administered 2014-12-19 – 2014-12-20 (×4): 4 mg via ORAL
  Filled 2014-12-19 (×4): qty 1

## 2014-12-19 NOTE — Progress Notes (Signed)
RT placed patient on cpap and added sterile water. Patient is resting comfortably.

## 2014-12-19 NOTE — Progress Notes (Signed)
Physical Therapy Session Note  Patient Details  Name: [name] MRN: [id number] Date of Birth: 10-Jun-1935  Today's Date: 12/19/2014 PT Individual Time: 1130-1200 PT Individual Time Calculation (min): 30 min   Short Term Goals: Week 2:  PT Short Term Goal 1 (Week 2): = LTGs  Skilled Therapeutic Interventions/Progress Updates:   Session focused on addressing overall strength, endurance and balance. Completed [place] HEP including mini squats and standing heel and toe raises x 15 reps each with BUE support with cues for technique. S level for transfers and gait (min A at times due to decreased safety with AD) with increased cues needed this session for safe use of AD when navigating obstacles. Nustep on level 7 x 5 min for overall strengthening and endurance (BLE only). End of session set up in recliner with lunch tray and all needs in reach.   Therapy Documentation Precautions:  Precautions Precautions: Back, Fall Precaution Comments: Lifting/pushing/pulling limit; no bending/arching/twisting Restrictions Weight Bearing Restrictions: No  Pain: Pain Assessment Pain Assessment: No/denies pain  See Function Navigator for Current Functional Status.   Therapy/Group: Individual Therapy  Canary Brim Ivory Broad, PT, DPT  12/19/2014, 12:01 PM

## 2014-12-19 NOTE — Progress Notes (Signed)
Physical Therapy Session Note  Patient Details  Name: [name] MRN: [id number] Date of Birth: Feb 23, 1936  Today's Date: 12/19/2014 PT Individual Time: 0800-0900 PT Individual Time Calculation (min): 60 min   Skilled Therapeutic Interventions/Progress Updates:    Discussed with pt d/c planning and spoke with wife on phone to set up family education for Monday. Notified scheduling team.  Session focused on functional transfers at S level including toileting, furniture transfers to low couch, and soft ADL apartment bed, gait training with RW on unit at S to steady A level x 150' with focus on slowing cadence, foot placement and posture, stair negotiation training for home and community mobility at steady A progressing to close S level in stairwell x 12 steps x 2 with cues for which foot to lead with and step to pattern, dynamic gait through obstacle course to practice side stepping and walking over uneven surface for community mobility with cues for safety, and Introduced HEP for [place] exercises to address strength and balance. Pt performed 10 reps each for BLE including LAQ with 5 second hold, standing hip abduction, and standing hamstring curls with 3# ankle weights. Unable to finish exercises due to time so will address in next session.  Therapy Documentation Precautions:  Precautions Precautions: Back, Fall Precaution Comments: Lifting/pushing/pulling limit; no bending/arching/twisting Restrictions Weight Bearing Restrictions: No  Pain: Denies pain.   See Function Navigator for Current Functional Status.   Therapy/Group: Individual Therapy  Canary Brim Ivory Broad, PT, DPT  12/19/2014, 10:28 AM

## 2014-12-19 NOTE — Progress Notes (Signed)
Occupational Therapy Session Note  Patient Details  Name: [name] MRN: [id number] Date of Birth: 1935-07-11  Today's Date: 12/19/2014 OT Individual Time: 1000-1100 and 1400-1445 OT Individual Time Calculation (min): 60 min  And 45 min   Short Term Goals: Week 1:  OT Short Term Goal 1 (Week 1): Pt will stand with RW to pull pants over hips with steadying A. OT Short Term Goal 2 (Week 1): Pt will be able to don socks with shoe horn with min cues. OT Short Term Goal 3 (Week 1): Pt will be able to transfer stand pivot to toilet with steadying A. OT Short Term Goal 4 (Week 1): Pt will demonstrate awareness of R foot placement with no cuing during sit to stand.  Skilled Therapeutic Interventions/Progress Updates:    Session One: Pt seen for OT ADL bathing and dressing session. Pt in supine upon arrival, voicing desire to complete showering task. He ambulated throughout room with RW and min steadying assist. He gathered clothing items from low drawer, VCs required to maintain spinal precautions during functional task with cues to squat vs. Bending over. Pt bathed seated on shower chair, standing with use of grabs to complete pericare/ buttock hygiene. He dressed seated EOB with supervision when standing at RW to pull pants up. He completed grooming task standing at sink. Pt with 1 LOB episode when attempting to doff shirt requiring min- mod A to regain balance. Pt with decreased awareness of deficits.  He required cues throughout bathing/ dressing session for safety awareness and to maintain spinal precautions during functional tasks. Pt left sitting in recliner at end of session, all needs in reach.   Session Two: Pt seen for OT therapy session focusing on ADL re-training and functional standing balance. Pt sitting up in recliner upon arrival, agreeable to tx session, voicing desire to complete toileting task. Pt ambulated throughout room with RW and CGA- close supervision. He completed toilet  transfers with supervision and steadying assist required for dynamic standing balance during clothing management and hygiene. Hand hygiene completed standing at the sink. He ambulated throughout unit to therapy gym. Pt completed reaching task, required to bend knees to reach horseshoe from low surface on R and place on overhead basketball rim on L, same task completed with moving horseshoes from L> R. He then completed horseshoe toss game, required to reach in various planes to obtain horseshoes, requiring pt to reach outside BOS and weight shift to B sides. Min steadying assist required. Trials completed with and without use of RW to challenge pt's functional dynamic standing balance.Max cuing given throughout session for maintaining spinal precautions and hand placement on RW during sit <> stand transfers.  Pt returned to room at end of session, left sitting in recliner with all needs in reach.    Therapy Documentation Precautions:  Precautions Precautions: Back, Fall Precaution Comments: Lifting/pushing/pulling limit; no bending/arching/twisting Restrictions Weight Bearing Restrictions: No Pain:  No/ denies pain  See Function Navigator for Current Functional Status.   Therapy/Group: Individual Therapy  Lewis, Mercadez Heitman C 12/19/2014, 7:12 AM

## 2014-12-19 NOTE — Progress Notes (Signed)
Occupational Therapy Weekly Progress Note  Patient Details  Name: [name] MRN: [id number] Date of Birth: 1935-06-22  Beginning of progress report period: December 13, 2014 End of progress report period: December 19, 2014   Patient has met 4 of 4 short term goals.  Pt making good progress towards OT goals. He currently requires steadying assist for dynamic/ functional standing balance and VCs for safety awareness as pt is impulsive with mobility with decreased insight into deficits, and spinal precautions during functional tasks.   Patient continues to demonstrate the following deficits:muscle weakness, decreased cardiorespiratoy endurance, ataxia, absent LE sensation and decreased standing balance and decreased balance strategies  and therefore will continue to benefit from skilled OT intervention to enhance overall performance with BADL, iADL and Reduce care partner burden.  Patient progressing toward long term goals.  Continue plan of care.  OT Short Term Goals Week 1:  OT Short Term Goal 1 (Week 1): Pt will stand with RW to pull pants over hips with steadying A. OT Short Term Goal 1 - Progress (Week 1): Met OT Short Term Goal 2 (Week 1): Pt will be able to don socks with shoe horn with min cues. OT Short Term Goal 2 - Progress (Week 1): Met OT Short Term Goal 3 (Week 1): Pt will be able to transfer stand pivot to toilet with steadying A. OT Short Term Goal 3 - Progress (Week 1): Met OT Short Term Goal 4 (Week 1): Pt will demonstrate awareness of R foot placement with no cuing during sit to stand. OT Short Term Goal 4 - Progress (Week 1): Met Week 2:  OT Short Term Goal 1 (Week 2): STG=LTG due to LOS   Therapy Documentation Precautions:  Precautions Precautions: Back, Fall Precaution Comments: Lifting/pushing/pulling limit; no bending/arching/twisting Restrictions Weight Bearing Restrictions: No  See Function Navigator for Current Functional Status.   Therapy/Group:  Individual Therapy  Lewis, Primrose Oler C 12/19/2014, 3:25 PM

## 2014-12-19 NOTE — Progress Notes (Signed)
[place]ILIT[facility]     PROGRESS NOTE    Subjective/Complaints: Had a good night. Needs a bm. Bladder emptying. No dizziness yesterday. Feels that balance is better . ROS: Pt denies fever, rash/itching, headache, blurred or double vision, nausea, vomiting, abdominal pain, diarrhea, chest pain, shortness of breath, palpitations, dysuria,   bleeding, anxiety, or depression   Objective: Vital Signs: Blood pressure 160/75, pulse 53, temperature 98.7 F (37.1 C), temperature source Oral, resp. rate 18, height 5\' 9"  (1.753 m), weight 101.9 kg (224 lb 10.4 oz), SpO2 94 %. No results found. No results for input(s): WBC, HGB, HCT, PLT in the last 72 hours. No results for input(s): NA, K, CL, GLUCOSE, BUN, CREATININE, CALCIUM in the last 72 hours.  Invalid input(s): CO CBG (last 3)   Recent Labs  12/18/14 1608 12/18/14 2059 12/19/14 0612  GLUCAP 203* 170* 212*    Wt Readings from Last 3 Encounters:  12/12/14 101.9 kg (224 lb 10.4 oz)  12/11/14 102.649 kg (226 lb 4.8 oz)  12/01/14 99.474 kg (219 lb 4.8 oz)    Physical Exam:  Constitutional: He is oriented to person, place, and time. He appears well-developed.  HENT: oral mucosa pink and moist Head: Normocephalic.  Eyes: EOM are normal.  Neck: Normal range of motion. Neck supple. No thyromegaly present.  Cardiovascular: Normal rate and regular rhythm. rare pac if any today Respiratory: Effort normal and breath sounds normal. No respiratory distress. No wheezes or rales GI: Soft. Bowel sounds are normal. He exhibits no distension.  Neurological: He is alert and oriented to person, place, and time.  Continued diminished LT/PP in bilateral lower extremities from upper thigh and distally. Decreased gross coordination of both legs, right more than left. RLE, mild limb ataxia: 4/5 hf,   4-ke and 3+ to 4- adf/apf. LLE: 4/5 hf, 4ke and 4-adf/apf. Bilateral UE: 5/5 deltoid, bicep, tricpe, wrist, HI. Marland Kitchen  Cognitively he's appropriate with normal insight and awareness.   Skin:  Incision site is clean and dry ---honeycomb dressing still in place Psychiatric: He has a normal mood and affect. His behavior is normal  Assessment/Plan: 1. 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 Physiatrist is providing close team supervision and 24 hour management of active medical problems listed below. Physiatrist and rehab team continue to assess barriers to discharge/monitor patient progress toward functional and medical goals.  Function:  Bathing Bathing position   Position: Shower  Bathing parts Body parts bathed by patient: Right arm, Left arm, Chest, Abdomen, Front perineal area, Buttocks, Right upper leg, Left upper leg, Right lower leg, Left lower leg, Back    Bathing assist Assist Level: Touching or steadying assistance(Pt > 75%)      Upper Body Dressing/Undressing Upper body dressing   What is the patient wearing?: Pull over shirt/dress     Pull over shirt/dress - Perfomed by patient: Thread/unthread right sleeve, Thread/unthread left sleeve, Put head through opening, Pull shirt over trunk          Upper body assist Assist Level: Set up      Lower Body Dressing/Undressing Lower body dressing   What is the patient wearing?: Underwear, Pants, Socks, Shoes Underwear - Performed by patient: Thread/unthread right underwear leg, Thread/unthread left underwear leg, Pull underwear up/down   Pants- Performed by patient: Thread/unthread right pants leg, Thread/unthread left pants leg, Pull pants up/down  Socks - Performed by patient: Don/doff right sock, Don/doff left sock Socks - Performed by helper: Don/doff right sock, Don/doff left sock Shoes - Performed by patient: Don/doff right shoe, Don/doff left shoe, Fasten right,  Fasten left Shoes - Performed by helper: Don/doff right shoe, Don/doff left shoe, Fasten right, Fasten left          Lower body assist Assist Level: Touching or steadying assistance (Pt > 75%)      Toileting Toileting Toileting activity did not occur: N/A Toileting steps completed by patient: Adjust clothing prior to toileting, Performs perineal hygiene, Adjust clothing after toileting   Toileting Assistive Devices: Grab bar or rail  Toileting assist Assist level: Supervision or verbal cues   Transfers Chair/bed transfer   Chair/bed transfer method: Ambulatory Chair/bed transfer assist level: Touching or steadying assistance (Pt > 75%) Chair/bed transfer assistive device: Armrests, Medical sales representative     Max distance: 150 Assist level: Touching or steadying assistance (Pt > 75%)   Wheelchair Wheelchair activity did not occur: N/A Type: Manual Max wheelchair distance: 150 Assist Level: Supervision or verbal cues  Cognition Comprehension Comprehension assist level: Follows complex conversation/direction with extra time/assistive device  Expression Expression assist level: Expresses complex ideas: With extra time/assistive device  Social Interaction Social Interaction assist level: Interacts appropriately 90% of the time - Needs monitoring or encouragement for participation or interaction.  Problem Solving Problem solving assist level: Solves complex 90% of the time/cues < 10% of the time  Memory Memory assist level: Recognizes or recalls 90% of the time/requires cueing < 10% of the time   Medical Problem List and Plan: 1. 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 -continue CIR therapies.  2. DVT Prophylaxis/Anticoagulation: SCDs. ambulating 3. Pain Management: Hydrocodone and Robaxin as needed. Monitor with increased mobility 4. Mood: Lexapro 10 mg daily. Provide emotional  support 5. Neuropsych: This patient is capable of making decisions on his own behalf. 6. Skin/Wound Care: surgical wound healing nicely. 7. Fluids/Electrolytes/Nutrition: encourage po 8. Hypertension. Norvasc 10 mg daily, Avapro 75 mg daily. bp's with improvement recently 9. Constipation/neurogenic bowel: sorbitol today if no bm 10. Hyperlipidemia. Crestor 11. History of gout. Allopurinol 300 mg daily. Monitor for any flareups 12. BPH with history of prostate cancer/hematuria. Flomax 0.4 mg daily, Ditropan 15 mg daily at bedtime.   -emptying fairly frequently but not far off baseline 13. CAD with stenting. Resume aspirin and Plavix -therapy to monitor for symptoms with increased exertion  - EKG with PAC's as on exam 14. Hyperglycemia: hgb A1C 6.9  -now on carb mod diet  -reduced decadron to 4mg    q12---reduce to 2mg  q12 on Sunday    -glucophage initiated  -diabetic education, will need glucose monitor  -will need to follow up with primary  -in speaking with his wife, he's been "in denial" regarding dm for awhile  -sugars showing improvement with above interventions 15. Dizziness: likely volume/med related. Asked him to take extra time when moving from lying to sitting to standing----he's working on hi "impulsivity"   LOS (Days) 7 A FACE TO FACE EVALUATION WAS PERFORMED  [name] 12/19/2014 7:09 AM

## 2014-12-20 ENCOUNTER — Inpatient Hospital Stay (HOSPITAL_COMMUNITY): Payer: Medicare Other | Admitting: Occupational Therapy

## 2014-12-20 LAB — GLUCOSE, CAPILLARY
Glucose-Capillary: 198 mg/dL — ABNORMAL HIGH (ref 65–99)
Glucose-Capillary: 167 mg/dL — ABNORMAL HIGH (ref 65–99)
Glucose-Capillary: 193 mg/dL — ABNORMAL HIGH (ref 65–99)
Glucose-Capillary: 242 mg/dL — ABNORMAL HIGH (ref 65–99)

## 2014-12-20 NOTE — Progress Notes (Signed)
Occupational Therapy Session Note  Patient Details  Name: [name] MRN: [id number] Date of Birth: April 25, 1935  Today's Date: 12/20/2014 OT Individual Time: 1515-1600 OT Individual Time Calculation (min): 45 min    Short Term Goals: Week 2:  OT Short Term Goal 1 (Week 2): STG=LTG due to LOS  Skilled Therapeutic Interventions/Progress Updates:  Pt received in family room with no c/o pain this session. Pt propelled wheelchair 250' + to main lobby of hospital in order to increase B UE strength and endurance. Pt ambulated with RW and min A for balance into public restroom for toileting. Pt stood to void with steady assist for clothing management this session. Pt ambulated on a variety  Of uneven surfaces with RW and min A for balance. No LOB occurring while outside. Pt ambulated up and down 6 steps with use of 1 hand rail but required verbal cues for proper technique and min A for balance. Pt required min cues to slow pace of ambulation for safety. Pt ambulating 250' back upstairs from lobby to room with RW and min A for balance. Pt required standing rest breaks as well as 2 seated rest breaks this session secondary to fatigue with community mobility tasks. Pt seated on EOB with call bell and all needed items within reach. Family notified of patients return to room.   Therapy Documentation Precautions:  Precautions Precautions: Back, Fall Precaution Comments: Lifting/pushing/pulling limit; no bending/arching/twisting Restrictions Weight Bearing Restrictions: No Vital Signs: Therapy Vitals Temp: 97.9 F (36.6 C) Temp Source: Oral Pulse Rate: 75 Resp: 18 BP: (!) 154/72 mmHg Patient Position (if appropriate): Sitting Oxygen Therapy SpO2: 97 % O2 Device: Not Delivered  See Function Navigator for Current Functional Status.   Therapy/Group: Individual Therapy  Phineas Semen 12/20/2014, 4:05 PM

## 2014-12-20 NOTE — Progress Notes (Signed)
Patient placed himself on CPAP of 8. No O2 bleed in needed. Patient tolerating well sat 97%. Rt will continue to monitor as needed.

## 2014-12-20 NOTE — Progress Notes (Signed)
[facility]     PROGRESS NOTE    Subjective/Complaints: No pains yesterday Discussed CBGs .no dizziness ROS: Pt denies fever, rash/itching, headache, blurred or double vision, nausea, vomiting, abdominal pain, diarrhea, chest pain, shortness of breath, palpitations, dysuria,   bleeding, anxiety, or depression   Objective: Vital Signs: Blood pressure 158/64, pulse 49, temperature 97.8 F (36.6 C), temperature source Oral, resp. rate 16, height 5\' 9"  (1.753 m), weight 101.9 kg (224 lb 10.4 oz), SpO2 98 %. No results found. No results for input(s): WBC, HGB, HCT, PLT in the last 72 hours. No results for input(s): NA, K, CL, GLUCOSE, BUN, CREATININE, CALCIUM in the last 72 hours.  Invalid input(s): CO CBG (last 3)   Recent Labs  12/19/14 1604 12/19/14 2102 12/20/14 0642  GLUCAP 222* 259* 198*    Wt Readings from Last 3 Encounters:  12/12/14 101.9 kg (224 lb 10.4 oz)  12/11/14 102.649 kg (226 lb 4.8 oz)  12/01/14 99.474 kg (219 lb 4.8 oz)    Physical Exam:  Constitutional: He is oriented to person, place, and time. He appears well-developed.  HENT: oral mucosa pink and moist Head: Normocephalic.  Eyes: EOM are normal.  Neck: Normal range of motion. Neck supple. No thyromegaly present.  Cardiovascular: Normal rate and regular rhythm. rare pac if any today Respiratory: Effort normal and breath sounds normal. No respiratory distress. No wheezes or rales GI: Soft. Bowel sounds are normal. He exhibits no distension.  Neurological: He is alert and oriented to person, place, and time.  Continued diminished LT/PP in bilateral lower extremities from upper thigh and distally. Decreased gross coordination of both legs, right more than left. RLE, : 4/5 hf,   4-ke and 3+ to 4- adf/apf. LLE: 4/5 hf, 4ke and 4-adf/apf. Bilateral UE: 5/5 deltoid, bicep, tricpe, wrist, HI. Marland Kitchen Cognitively he's appropriate with normal insight and awareness.     Psychiatric: He has a normal mood and affect. His behavior is normal  Assessment/Plan: 1. 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 Physiatrist is providing close team supervision and 24 hour management of active medical problems listed below. Physiatrist and rehab team continue to assess barriers to discharge/monitor patient progress toward functional and medical goals.  Function:  Bathing Bathing position   Position: Shower  Bathing parts Body parts bathed by patient: Right arm, Left arm, Chest, Abdomen, Front perineal area, Buttocks, Right upper leg, Left upper leg, Right lower leg, Left lower leg, Back    Bathing assist Assist Level: Supervision or verbal cues      Upper Body Dressing/Undressing Upper body dressing   What is the patient wearing?: Pull over shirt/dress     Pull over shirt/dress - Perfomed by patient: Thread/unthread right sleeve, Thread/unthread left sleeve, Put head through opening, Pull shirt over trunk          Upper body assist Assist Level: No help, No cues      Lower Body Dressing/Undressing Lower body dressing   What is the patient wearing?: Underwear, Pants, Socks, Shoes Underwear - Performed by patient: Thread/unthread right underwear leg, Thread/unthread left underwear leg, Pull underwear up/down   Pants- Performed by patient: Thread/unthread right pants leg, Thread/unthread left pants leg, Pull pants up/down       Socks - Performed by patient: Don/doff right sock, Don/doff left sock Socks - Performed by helper: Don/doff right sock, Don/doff  left sock Shoes - Performed by patient: Don/doff right shoe, Don/doff left shoe, Fasten right, Fasten left Shoes - Performed by helper: Don/doff right shoe, Don/doff left shoe, Fasten right, Fasten left          Lower body assist Assist  Level: Touching or steadying assistance (Pt > 75%)      Toileting Toileting Toileting activity did not occur: N/A Toileting steps completed by patient: Adjust clothing prior to toileting, Performs perineal hygiene, Adjust clothing after toileting   Toileting Assistive Devices: Grab bar or rail  Toileting assist Assist level: Touching or steadying assistance (Pt.75%)   Transfers Chair/bed transfer   Chair/bed transfer method: Ambulatory Chair/bed transfer assist level: Supervision or verbal cues Chair/bed transfer assistive device: Armrests, Medical sales representative     Max distance: 150 Assist level: Touching or steadying assistance (Pt > 75%)   Wheelchair Wheelchair activity did not occur: N/A Type: Manual Max wheelchair distance: 150 Assist Level: Supervision or verbal cues  Cognition Comprehension Comprehension assist level: Follows complex conversation/direction with extra time/assistive device  Expression Expression assist level: Expresses complex ideas: With extra time/assistive device  Social Interaction Social Interaction assist level: Interacts appropriately with others with medication or extra time (anti-anxiety, antidepressant).  Problem Solving Problem solving assist level: Solves complex problems: With extra time  Memory Memory assist level: Complete Independence: No helper   Medical Problem List and Plan: 1. 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 -continue CIR therapies.  2. DVT Prophylaxis/Anticoagulation: SCDs. ambulating 3. Pain Management: Hydrocodone and Robaxin as needed. Monitor with increased mobility 4. Mood: Lexapro 10 mg daily. Provide emotional support 5. Neuropsych: This patient is capable of making decisions on his own behalf. 6. Skin/Wound Care: surgical wound healing nicely. 7. Fluids/Electrolytes/Nutrition: encourage po 8. Hypertension. Norvasc 10 mg  daily, Avapro 75 mg daily. bp's with improvement recently 9. Constipation/neurogenic bowel: sorbitol today if no bm 10. Hyperlipidemia. Crestor 11. History of gout. Allopurinol 300 mg daily. Monitor for any flareups 12. BPH with history of prostate cancer/hematuria. Flomax 0.4 mg daily, Ditropan 15 mg daily at bedtime.   -emptying fairly frequently but not far off baseline 13. CAD with stenting. Resume aspirin and Plavix -therapy to monitor for symptoms with increased exertion  - EKG with PAC's as on exam 14. Hyperglycemia: hgb A1C 6.9  -now on carb mod diet  -reduced decadron to 4mg    q12---reduce to 2mg  q12 on Sunday  , should improve CBGs  -glucophage initiated  -diabetic education, will need glucose monitor  -will need to follow up with primary    15. Dizziness: likely volume/med related.  16.  Asymptomatic bradycardia, EKG 12/15/14 normal with rate 64, no BB on board, will monitor  LOS (Days) 8 A FACE TO FACE EVALUATION WAS PERFORMED  Charlett Blake 12/20/2014 8:46 AM

## 2014-12-21 ENCOUNTER — Inpatient Hospital Stay (HOSPITAL_COMMUNITY): Payer: Medicare Other | Admitting: Occupational Therapy

## 2014-12-21 ENCOUNTER — Inpatient Hospital Stay (HOSPITAL_COMMUNITY): Payer: Medicare Other | Admitting: Physical Therapy

## 2014-12-21 LAB — GLUCOSE, CAPILLARY
Glucose-Capillary: 216 mg/dL — ABNORMAL HIGH (ref 65–99)
Glucose-Capillary: 110 mg/dL — ABNORMAL HIGH (ref 65–99)
Glucose-Capillary: 209 mg/dL — ABNORMAL HIGH (ref 65–99)
Glucose-Capillary: 213 mg/dL — ABNORMAL HIGH (ref 65–99)

## 2014-12-21 NOTE — Progress Notes (Signed)
[facility]IC[facility] & RE[facility]ILITATION     PROGRESS NOTE    Subjective/Complaints: Walked around [employer] with therapy yesterday, asking about return to work ROS: Pt denies fever, rash/itching, headache, blurred or double vision, nausea, vomiting, abdominal pain, diarrhea, chest pain, shortness of breath, palpitations, dysuria,   bleeding, anxiety, or depression   Objective: Vital Signs: Blood pressure 183/69, pulse 50, temperature 98 F (36.7 C), temperature source Oral, resp. rate 17, height 5\' 9"  (1.753 m), weight 101.9 kg (224 lb 10.4 oz), SpO2 97 %. No results found. No results for input(s): WBC, HGB, HCT, PLT in the last 72 hours. No results for input(s): NA, K, CL, GLUCOSE, BUN, CREATININE, CALCIUM in the last 72 hours.  Invalid input(s): CO CBG (last 3)   Recent Labs  12/20/14 1128 12/20/14 1632 12/20/14 2103  GLUCAP 167* 193* 242*    Wt Readings from Last 3 Encounters:  12/12/14 101.9 kg (224 lb 10.4 oz)  12/11/14 102.649 kg (226 lb 4.8 oz)  12/01/14 99.474 kg (219 lb 4.8 oz)    Physical Exam:  Constitutional: He is oriented to person, place, and time. He appears well-developed.  HENT: oral mucosa pink and moist Head: Normocephalic.  Eyes: EOM are normal.  Neck: Normal range of motion. Neck supple. No thyromegaly present.  Cardiovascular: Normal rate and regular rhythm. rare pac if any today Respiratory: Effort normal and breath sounds normal. No respiratory distress. No wheezes or rales GI: Soft. Bowel sounds are normal. He exhibits no distension.  Neurological: He is alert and oriented to person, place, and time.  Continued diminished LT/PP in bilateral lower extremities from upper thigh and distally. Decreased gross coordination of both legs, right more than left. RLE, : 4/5 hf,   4-ke and 3+ to 4- adf/apf. LLE: 4/5 hf, 4ke and 4-adf/apf. Bilateral UE: 5/5 deltoid, bicep, tricpe, wrist, HI. Marland Kitchen Cognitively he's appropriate with normal insight and  awareness.    Psychiatric: He has a normal mood and affect. His behavior is normal  Assessment/Plan: 1. 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 Physiatrist is providing close team supervision and 24 hour management of active medical problems listed below. Physiatrist and rehab team continue to assess barriers to discharge/monitor patient progress toward functional and medical goals.  Function:  Bathing Bathing position   Position: Shower  Bathing parts Body parts bathed by patient: Right arm, Left arm, Chest, Abdomen, Front perineal area, Buttocks, Right upper leg, Left upper leg, Right lower leg, Left lower leg, Back    Bathing assist Assist Level: Supervision or verbal cues      Upper Body Dressing/Undressing Upper body dressing   What is the patient wearing?: Pull over shirt/dress     Pull over shirt/dress - Perfomed by patient: Thread/unthread right sleeve, Thread/unthread left sleeve, Put head through opening, Pull shirt over trunk          Upper body assist Assist Level: No help, No cues      Lower Body Dressing/Undressing Lower body dressing   What is the patient wearing?: Underwear, Pants, Socks, Shoes Underwear - Performed by patient: Thread/unthread right underwear leg, Thread/unthread left underwear leg, Pull underwear up/down   Pants- Performed by patient: Thread/unthread right pants leg, Thread/unthread left pants leg, Pull pants up/down       Socks - Performed by patient: Don/doff right sock, Don/doff left sock Socks - Performed by helper:  Don/doff right sock, Don/doff left sock Shoes - Performed by patient: Don/doff right shoe, Don/doff left shoe, Fasten right, Fasten left Shoes - Performed by helper: Don/doff right shoe, Don/doff left shoe, Fasten right, Fasten left          Lower body  assist Assist Level: Touching or steadying assistance (Pt > 75%)      Toileting Toileting Toileting activity did not occur: N/A Toileting steps completed by patient: Adjust clothing prior to toileting, Performs perineal hygiene, Adjust clothing after toileting   Toileting Assistive Devices: Grab bar or rail  Toileting assist Assist level: Touching or steadying assistance (Pt.75%)   Transfers Chair/bed transfer   Chair/bed transfer method: Ambulatory Chair/bed transfer assist level: Supervision or verbal cues Chair/bed transfer assistive device: Armrests, Medical sales representative     Max distance: 250 Assist level: Touching or steadying assistance (Pt > 75%)   Wheelchair Wheelchair activity did not occur: N/A Type: Manual Max wheelchair distance: 150 Assist Level: Supervision or verbal cues  Cognition Comprehension Comprehension assist level: Follows complex conversation/direction with no assist  Expression Expression assist level: Expresses complex ideas: With no assist  Social Interaction Social Interaction assist level: Interacts appropriately with others - No medications needed.  Problem Solving Problem solving assist level: Solves complex problems: Recognizes & self-corrects  Memory Memory assist level: Complete Independence: No helper   Medical Problem List and Plan: 1. 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 -continue CIR therapies.  2. DVT Prophylaxis/Anticoagulation: SCDs. ambulating 3. Pain Management: Hydrocodone and Robaxin as needed. Monitor with increased mobility 4. Mood: Lexapro 10 mg daily. Provide emotional support 5. Neuropsych: This patient is capable of making decisions on his own behalf. 6. Skin/Wound Care: surgical wound healing nicely. 7. Fluids/Electrolytes/Nutrition: encourage po 8. Hypertension. Norvasc 10 mg daily, Avapro 150 mg daily. bp's elevated this  am [id number] systolic, may need increased avapro 9. Constipation/neurogenic bowel: sorbitol today if no bm 10. Hyperlipidemia. Crestor 11. History of gout. Allopurinol 300 mg daily. Monitor for any flareups 12. BPH with history of prostate cancer/hematuria. Flomax 0.4 mg daily, Ditropan 15 mg daily at bedtime.   -emptying fairly frequently but not far off baseline 13. CAD with stenting. Resume aspirin and Plavix -therapy to monitor for symptoms with increased exertion  - EKG with PAC's as on exam 14. Hyperglycemia: hgb A1C 6.9  -now on carb mod diet  -reduced decadron to 4mg    q12---reduce to 2mg  q12 on Sunday  , should improve CBGs within the next 1-2 days -glucophage initiated  -diabetic education, will need glucose monitor  -will need to follow up with primary    15. Dizziness: improved 16.  Asymptomatic bradycardia, EKG 12/15/14 normal with rate 64, no BB on board, will monitor, HR 50  LOS (Days) 9 A FACE TO FACE EVALUATION WAS PERFORMED  Charlett Blake 12/21/2014 8:49 AM

## 2014-12-21 NOTE — Progress Notes (Signed)
Occupational Therapy Session Note  Patient Details  Name: RAMELO CRUZHERNANDEZ MRN: [id number] Date of Birth: 1936/03/20  Today's Date: 12/21/2014 OT Individual Time: 1000-1056 OT Individual Time Calculation (min): 56 min    Short Term Goals: Week 2:  OT Short Term Goal 1 (Week 2): STG=LTG due to LOS  Skilled Therapeutic Interventions/Progress Updates:    Upon entering the room, pt supine in bed with no c/o pain this session. Skilled OT intervention with focus on self care retraining, functional transfers, and standing balance. Pt performed supine >sit with supervision. Pt ambulated with RW and steady assist to obtain needed items for self care tasks. Pt then engaged in bathing from shower level this session with use of long handled reacher while seated on shower seat in order to increase I with self care. Pt required education regarding energy conservation and safety with regards to sitting when possible during dynamic tasks. Pt ambulated in same manner to sit on EOB and don clothing items with min verbal cues to cross legs over knee in order to thread clothing items for LB. Pt stood at sink for 13 minutes with supervision - steady assist while brushing teeth and shaving face. At end of session, pt seated on edge of bed with call bell and all needed items within reach. Tray table placed in front of him.   Therapy Documentation Precautions:  Precautions Precautions: Back, Fall Precaution Comments: Lifting/pushing/pulling limit; no bending/arching/twisting Restrictions Weight Bearing Restrictions: No  See Function Navigator for Current Functional Status.   Therapy/Group: Individual Therapy  Phineas Semen 12/21/2014, 11:01 AM

## 2014-12-21 NOTE — Progress Notes (Signed)
Patient has refused to wear CPAP tonight. Patient states he just wants to sleep without it tonight. RT will continue to monitor as needed.

## 2014-12-22 ENCOUNTER — Inpatient Hospital Stay (HOSPITAL_COMMUNITY): Payer: Medicare Other | Admitting: Physical Therapy

## 2014-12-22 ENCOUNTER — Inpatient Hospital Stay (HOSPITAL_COMMUNITY): Payer: Medicare Other

## 2014-12-22 ENCOUNTER — Encounter (HOSPITAL_COMMUNITY): Payer: Medicare Other | Admitting: Occupational Therapy

## 2014-12-22 ENCOUNTER — Inpatient Hospital Stay (HOSPITAL_COMMUNITY): Payer: Medicare Other | Admitting: Occupational Therapy

## 2014-12-22 LAB — GLUCOSE, CAPILLARY
Glucose-Capillary: 193 mg/dL — ABNORMAL HIGH (ref 65–99)
Glucose-Capillary: 170 mg/dL — ABNORMAL HIGH (ref 65–99)
Glucose-Capillary: 198 mg/dL — ABNORMAL HIGH (ref 65–99)
Glucose-Capillary: 267 mg/dL — ABNORMAL HIGH (ref 65–99)

## 2014-12-22 MED ORDER — METFORMIN HCL 500 MG PO TABS
500.0000 mg | ORAL_TABLET | Freq: Two times a day (BID) | ORAL | Status: DC
  Administered 2014-12-22 – 2014-12-25 (×6): 500 mg via ORAL
  Filled 2014-12-22 (×5): qty 1

## 2014-12-22 NOTE — Progress Notes (Signed)
Physical Therapy Session Note  Patient Details  Name: [name] MRN: [id number] Date of Birth: 09/12/1935  Today's Date: 12/22/2014 PT Individual Time: 0900-1000 PT Individual Time Calculation (min): 60 min   Short Term Goals: Week 2:  PT Short Term Goal 1 (Week 2): = LTGs  Skilled Therapeutic Interventions/Progress Updates:    Session focused on family education with pt and pt's wife in regards to basic transfers, gait with RW with focus on cues for safe speed and attention to foot placement, simulated car transfer, stair negotiation (curb step with RW, steps with 1 rail and both rails to simulate home access), bed mobility with emphasis on technique and precautions, reviewing precautions, recommendations for safe guarding, and education on energy conservation techniques. Also demonstrated and reviewed [place]. [name] able to return demonstrate all mobility aspects and both deny any concerns. [name] brought in photos of home entry and household as well. Discussed recommendation for S with mobility for safety due to pt's decreased attention and high fall risk. Verbalized understanding.   Therapy Documentation Precautions:  Precautions Precautions: Back, Fall Precaution Comments: Lifting/pushing/pulling limit; no bending/arching/twisting Restrictions Weight Bearing Restrictions: No  Pain: Pain Assessment Pain Assessment: No/denies pain    See Function Navigator for Current Functional Status.   Therapy/Group: Individual Therapy  Canary Brim Ivory Broad, PT, DPT  12/22/2014, 11:22 AM

## 2014-12-22 NOTE — Progress Notes (Signed)
Physical Therapy Session Note  Patient Details  Name: [name] MRN: [id number] Date of Birth: 03/01/1936  Today's Date: 12/22/2014 PT Individual Time: 1136-1204 PT Individual Time Calculation (min): 28 min   Short Term Goals: Week 1:  PT Short Term Goal 1 (Week 1): Pt will be mod I with rolling and transfers supine to edge of bed, edge of bed to supine.  PT Short Term Goal 1 - Progress (Week 1): Partly met (cues needed for adhering to precautions) PT Short Term Goal 2 (Week 1): Pt will transfer stand pivot with/without rolling walker and min A. PT Short Term Goal 2 - Progress (Week 1): Met PT Short Term Goal 3 (Week 1): Pt will ambulate with LRAD about 150 feet with min A.  PT Short Term Goal 3 - Progress (Week 1): Met PT Short Term Goal 4 (Week 1): Pt will ascend/descend 12 stairs with B rails and min A.  PT Short Term Goal 4 - Progress (Week 1): Met PT Short Term Goal 5 (Week 1): Pt will propel w/c about 150 feet with mod I.  PT Short Term Goal 5 - Progress (Week 1): Met  Skilled Therapeutic Interventions/Progress Updates:   Pt received in w/c and agreeable to therapy session.  Pt amb to/from therapy gym with RW and supervision, requiring verbal cues for pacing, upright posture and R foot position during sit<>stand transfers.  PT instructed patient in HEP consisting of LAQ, standing hip abd, standing hamstring curls, minisquats, and calf raises 2x15 with 1 seated rest break in between sets. Pt returned to room at end of session and positioned sitting EOB with lunch tray in front, call bell in reach, and needs met.      Therapy Documentation Precautions:  Precautions Precautions: Back, Fall Precaution Comments: Lifting/pushing/pulling limit; no bending/arching/twisting Restrictions Weight Bearing Restrictions: No  Pain: Pain Assessment Pain Assessment: No/denies pain   See Function Navigator for Current Functional Status.   Therapy/Group: Individual Therapy  Earnest Conroy Penven-Crew 12/22/2014, 12:27 PM

## 2014-12-22 NOTE — Progress Notes (Signed)
Occupational Therapy Session Note  Patient Details  Name: [name] MRN: [id number] Date of Birth: 1936-03-08  Today's Date: 12/22/2014 OT Individual Time: 0800-0900 and 1300-1435 OT Individual Time Calculation (min): 60 min and 45 min   Short Term Goals: Week 2:  OT Short Term Goal 1 (Week 2): STG=LTG due to LOS  Skilled Therapeutic Interventions/Progress Updates:    Session One: Pt seen for OT therapy session focusing on family/ caregiver training. Pt seated EOB putting on shoes upon arrival with wife present. Pt ambulated throughout room and unit using RW with supervision. He compelted standing toileting task with VCs for RW management during functional task. He ambulated to ADL apartment where extensive education and recommendations provided to pt and his wife/caregiver. [name] had pictures of home/ bathroom layout. Recommended pt using tub bench for shower transfers and recommendation for placement of grab bars. In ADL apartment, pt completed simulated tub bench transfer with VCs for technique/ sequencing. He then completed toilet transfer from standard toilet using grab bars. Educated regarding use of 3-1 BSC over toilet for elevated surface and for use of arm rests to assist with sit <> stand. He then completed bed mobility on standard bed, demonstrating good carry over of education for log rolling techniques.  Education provided regarding spinal pre-cautions esp during functional tasks such as laundry- pt and caregiver voiced understanding. Educated pt's wife regarding pt's sensory deficits and functional implications/ need for assist. Recommend all bathing/dressing tasks take place at seated level as pt with decreased dynamic standing balance, even more so without UE support- both voiced understanding and agreement of assist recommendations.  Pt left in therapy gym at end of session with hand off to PT.   Session Two: Pt seen for OT therapy session of focusing on neuromuscular  re-education and functional transfers/ ADL re- training. Pt in supine upon arrival, agreeable to tx session. He completed functional transfers and ambulated throughout unit using RW with supervision. Min VCs required for hand placement on RW and to slow down when ambulating as pt with decreased balanced when moving quickly. In therapy gym, pt completed pipe tree task while standing on foam mat.  Pt with significantly improved LE control to step onto foam mat compared to previous sessions. HE stood on mat with min steadying assist to complete pipe tree tasks utilizing B UEs, Min VCs provided for weight shift. Pt returned to room and requested need for toileting task. Toileting tasks completed with supervision with pt requiring VCs not to stand and complete tasks without staff present. Pt returned to supine at end of session, left with all needs in reach and bed alarm on.  Pt educated throughout session regarding energy conservation, need for assist, safety awareness, and d/c planning.   Therapy Documentation Precautions:  Precautions Precautions: Back, Fall Precaution Comments: Lifting/pushing/pulling limit; no bending/arching/twisting Restrictions Weight Bearing Restrictions: No Pain:  No / denies pain.  See Function Navigator for Current Functional Status.   Therapy/Group: Individual Therapy  Lewis, Tyshawn Keel C 12/22/2014, 7:12 AM

## 2014-12-22 NOTE — Progress Notes (Signed)
[facility]IT[facility]     PROGRESS NOTE    Subjective/Complaints: Had a dizzy spell when bearing down to empty on toilet yesterday. When he held his "head with his hands" it went away. No other problems \ ROS: Pt denies fever, rash/itching, headache, blurred or double vision, nausea, vomiting, abdominal pain, diarrhea, chest pain, shortness of breath, palpitations, dysuria,   bleeding, anxiety, or depression   Objective: Vital Signs: Blood pressure 140/80, pulse 60, temperature 98 F (36.7 C), temperature source Oral, resp. rate 16, height 5\' 9"  (1.753 m), weight 101.9 kg (224 lb 10.4 oz), SpO2 98 %. No results found. No results for input(s): WBC, HGB, HCT, PLT in the last 72 hours. No results for input(s): NA, K, CL, GLUCOSE, BUN, CREATININE, CALCIUM in the last 72 hours.  Invalid input(s): CO CBG (last 3)   Recent Labs  12/21/14 1628 12/21/14 2059 12/22/14 0648  GLUCAP 209* 213* 193*    Wt Readings from Last 3 Encounters:  12/12/14 101.9 kg (224 lb 10.4 oz)  12/11/14 102.649 kg (226 lb 4.8 oz)  12/01/14 99.474 kg (219 lb 4.8 oz)    Physical Exam:  Constitutional: He is oriented to person, place, and time. He appears well-developed.  HENT: oral mucosa pink and moist Head: Normocephalic.  Eyes: EOM are normal.  Neck: Normal range of motion. Neck supple. No thyromegaly present.  Cardiovascular: Normal rate and regular rhythm. rare pac if any today Respiratory: Effort normal and breath sounds normal. No respiratory distress. No wheezes or rales GI: Soft. Bowel sounds are normal. He exhibits no distension.  Neurological: He is alert and oriented to person, place, and time.  Continued diminished LT/PP in bilateral lower extremities from upper thigh and distally. Decreased gross coordination of both legs, right more than left. RLE, : 4/5 hf,   4ke and 4- adf/apf. LLE: 4/5 hf, 4ke and 4-adf/apf. Bilateral UE: 5/5 deltoid, bicep, tricpe, wrist,  HI. Marland Kitchen Cognitively he's appropriate with normal insight and awareness.    Psychiatric: He has a normal mood and affect. His behavior is normal  Assessment/Plan: 1. 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 Physiatrist is providing close team supervision and 24 hour management of active medical problems listed below. Physiatrist and rehab team continue to assess barriers to discharge/monitor patient progress toward functional and medical goals.  Function:  Bathing Bathing position   Position: Shower  Bathing parts Body parts bathed by patient: Right arm, Left arm, Chest, Abdomen, Front perineal area, Buttocks, Right upper leg, Left upper leg, Right lower leg, Left lower leg, Back    Bathing assist Assist Level: Supervision or verbal cues      Upper Body Dressing/Undressing Upper body dressing   What is the patient wearing?: Pull over shirt/dress     Pull over shirt/dress - Perfomed by patient: Thread/unthread right sleeve, Thread/unthread left sleeve, Put head through opening, Pull shirt over trunk          Upper body assist Assist Level: No help, No cues      Lower Body Dressing/Undressing Lower body dressing   What is the patient wearing?: Underwear, Pants, Socks, Shoes Underwear - Performed by patient: Thread/unthread right underwear leg, Thread/unthread left underwear leg, Pull underwear up/down   Pants- Performed by patient: Thread/unthread right pants leg, Thread/unthread left pants leg, Pull pants up/down       Socks -  Performed by patient: Don/doff right sock, Don/doff left sock Socks - Performed by helper: Don/doff right sock, Don/doff left sock Shoes - Performed by patient: Don/doff right shoe, Don/doff left shoe, Fasten right, Fasten left Shoes - Performed by helper: Don/doff right shoe, Don/doff left  shoe, Fasten right, Fasten left          Lower body assist Assist Level: Touching or steadying assistance (Pt > 75%)      Toileting Toileting Toileting activity did not occur: N/A Toileting steps completed by patient: Adjust clothing prior to toileting, Performs perineal hygiene, Adjust clothing after toileting   Toileting Assistive Devices: Grab bar or rail  Toileting assist Assist level: Touching or steadying assistance (Pt.75%)   Transfers Chair/bed transfer   Chair/bed transfer method: Ambulatory Chair/bed transfer assist level: Supervision or verbal cues Chair/bed transfer assistive device: Armrests, Medical sales representative     Max distance: 250 Assist level: Touching or steadying assistance (Pt > 75%)   Wheelchair Wheelchair activity did not occur: N/A Type: Manual Max wheelchair distance: 150 Assist Level: Supervision or verbal cues  Cognition Comprehension Comprehension assist level: Follows basic conversation/direction with no assist  Expression Expression assist level: Expresses basic needs/ideas: With extra time/assistive device  Social Interaction Social Interaction assist level: Interacts appropriately 90% of the time - Needs monitoring or encouragement for participation or interaction.  Problem Solving Problem solving assist level: Solves basic 90% of the time/requires cueing < 10% of the time  Memory Memory assist level: Recognizes or recalls 90% of the time/requires cueing < 10% of the time   Medical Problem List and Plan: 1. 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 -continue CIR therapies.  2. DVT Prophylaxis/Anticoagulation: SCDs. ambulating 3. Pain Management: Hydrocodone and Robaxin as needed. Monitor with increased mobility 4. Mood: Lexapro 10 mg daily. Provide emotional support 5. Neuropsych: This patient is capable of making decisions on his own behalf. 6.  Skin/Wound Care: surgical wound healing nicely. 7. Fluids/Electrolytes/Nutrition: encourage po 8. Hypertension. Norvasc 10 mg daily, Avapro 150 mg daily. bp's elevated this am [id number] systolic, may need increased avapro 9. Constipation/neurogenic bowel: bowels sluggish but he's generally continent. Encourage fluids 10. Hyperlipidemia. Crestor 11. History of gout. Allopurinol 300 mg daily. Monitor for any flareups 12. BPH with history of prostate cancer/hematuria. Flomax 0.4 mg daily, Ditropan 15 mg daily at bedtime.   -emptying fairly frequently but not far off baseline 13. CAD with stenting. Resume aspirin and Plavix -therapy to monitor for symptoms with increased exertion  - EKG with PAC's as on exam 14. Hyperglycemia: hgb A1C 6.9  -now on carb mod diet  -reduced decadron to  2mg  q12 on Sunday    -glucophage increased to bid  -diabetic education, will need glucose monitor  -will need to follow up with primary    15. Dizziness: improved 16.  Asymptomatic bradycardia, EKG 12/15/14 normal with rate 64, no BB on board, will monitor, HR 50  LOS (Days) 10 A FACE TO FACE EVALUATION WAS PERFORMED  Alima Naser T 12/22/2014 8:17 AM

## 2014-12-23 ENCOUNTER — Inpatient Hospital Stay (HOSPITAL_COMMUNITY): Payer: Medicare Other | Admitting: Occupational Therapy

## 2014-12-23 ENCOUNTER — Inpatient Hospital Stay (HOSPITAL_COMMUNITY): Payer: Medicare Other

## 2014-12-23 ENCOUNTER — Inpatient Hospital Stay (HOSPITAL_COMMUNITY): Payer: Medicare Other | Admitting: Physical Therapy

## 2014-12-23 LAB — GLUCOSE, CAPILLARY
Glucose-Capillary: 207 mg/dL — ABNORMAL HIGH (ref 65–99)
Glucose-Capillary: 96 mg/dL (ref 65–99)
Glucose-Capillary: 225 mg/dL — ABNORMAL HIGH (ref 65–99)
Glucose-Capillary: 192 mg/dL — ABNORMAL HIGH (ref 65–99)

## 2014-12-23 NOTE — Progress Notes (Signed)
Occupational Therapy Session Note  Patient Details  Name: [name] MRN: [id number] Date of Birth: 05-15-1935  Today's Date: 12/23/2014 OT Individual Time: 1030-1200 OT Individual Time Calculation (min): 90 min    Short Term Goals: Week 2:  OT Short Term Goal 1 (Week 2): STG=LTG due to LOS  Skilled Therapeutic Interventions/Progress Updates:    Pt seen for OT ADL bathing and dressing session. Pt in supine upon arrival, agreeable to tx session. He ambulated throughout room with RW and supervision. Show transfer to shower chair completed with supervision wih VCs for sequencing/ RW management. Pt bathed seated with supervision, standing with grab  Bars to complete buttock hygiene. He dressed seated on EOB using RW to complete sit <> stands. He completed grooming tasks standing at the sink with one UE support during functional tasks. He ambulated throughout unit with RW and supervision with VCs to look upright when ambulating as pt with tendency to look at feet/ floor. He completed functional transfers on / off low soft surface couch with increased time and VCs for hand placement on RW. He stood at counter to water house plants, requiring increased time and attention to take side steps to R compared to walking straight. He then walked throughout unit to other side of hospital, required to ambulate over door jams and up incline. Pt required to find various items on hallway walls to encourage pt to look up when ambulating and for divided attention to simulate community mobility.  In ADL apartment, pt completed simulated tub transfer using bench. VCs provided for sequencing of transfer.  Pt returned to room at end of session, left sitting in recliner with all needs in reach. Educated regarding use of call bell for assistance.   VCs provided throughout session regarding safety awareness and steps to decrease risk of falls.  Education provided regarding decreased sensation, spinal pathways, need for  assist, importance of participation with ADLs/IADLs, fall  Energy conservation, and d/c planning.   Therapy Documentation Precautions:  Precautions Precautions: Back, Fall Precaution Comments: Lifting/pushing/pulling limit; no bending/arching/twisting Restrictions Weight Bearing Restrictions: No Pain: Pain Assessment Pain Score: 3  Pain Location: Back Pain Orientation: Right Pain Descriptors / Indicators: Aching Pain Intervention(s): Repositioned;Ambulation/increased activity;Shower  See Function Navigator for Current Functional Status.   Therapy/Group: Individual Therapy  Lewis, Solange Emry C 12/23/2014, 7:21 AM

## 2014-12-23 NOTE — Progress Notes (Signed)
Physical Therapy Session Note  Patient Details  Name: [name] MRN: [id number] Date of Birth: [birth year]  Today's Date: 12/23/2014 PT Individual Time: 1430-1530 PT Individual Time Calculation (min): 60 min   Short Term Goals: Week 2:  PT Short Term Goal 1 (Week 2): = LTGs  Skilled Therapeutic Interventions/Progress Updates:    Pt received seated in recliner; no c/o pain and agreeable to treatment. Gait training with RW and supervision for 2 trials of 500'. Occasional cues for upright posture and slowing down speed. Seated rest break due to fatigue. Nustep x8 min on level 6 with BUE/BLE and average 30 steps/min; performed to improve aerobic endurance and LE strength for carryover into functional mobility tasks. Stair training for 1 trial of 4 6-inch stairs with L handrail; pt demonstrates and explains to wife how to perform stairs when he goes home. Pt incorrectly states he should lead with his weaker side, wife corrected pt and pt performed correctly. Pt ambulated back to room with RW and supervision, reported needing to use restroom. Bathroom transfer performed with RW and supervision. Standing at sink x5 min while washing hands and to allow for his visiting friend to trim pt's beard. Pt returned to bed to rest; supervision overall for bed transfer and positioning. Left supine with all needs in reach and wife present at completion of session.  Therapy Documentation Precautions:  Precautions Precautions: Back, Fall Precaution Comments: Lifting/pushing/pulling limit; no bending/arching/twisting Restrictions Weight Bearing Restrictions: No Pain: Pain Assessment Pain Assessment: No/denies pain Pain Score: 0-No pain   See Function Navigator for Current Functional Status.   Therapy/Group: Individual Therapy  Luberta Mutter 12/23/2014, 3:38 PM

## 2014-12-23 NOTE — Progress Notes (Signed)
Physical Therapy Session Note  Patient Details  Name: [name] MRN: [id number] Date of Birth: 08-08-1935  Today's Date: 12/23/2014 PT Individual Time: 0800-0900 PT Individual Time Calculation (min): 60 min   Short Term Goals: Week 2:  PT Short Term Goal 1 (Week 2): = LTGs  Skilled Therapeutic Interventions/Progress Updates:    Session focused on addressing functional transfers with RW (pt using sign on front of w/c for recall of watching R foot placement at mod I level), gait outdoors in community environment with RW going over uneven surfaces, inclines, and navigating thresholds of doorways and elevators at S level with cues for attention to obstacles and safe mobility, overall endurance and strengthening with education on energy conservation in the community, stair negotiation training for home mobility simulation with 1 rail and 2 rails at close S level, and reviewed [place] HEP with pt directing exercises using handout with verbal cues for technique.   Therapy Documentation Precautions:  Precautions Precautions: Back, Fall Precaution Comments: Lifting/pushing/pulling limit; no bending/arching/twisting Restrictions Weight Bearing Restrictions: No  Pain:  Denies pain.   See Function Navigator for Current Functional Status.   Therapy/Group: Individual Therapy  Canary Brim Ivory Broad, PT, DPT  12/23/2014, 9:22 AM

## 2014-12-23 NOTE — Progress Notes (Signed)
[facility] PHYSICAL MEDICINE & REHABILITATION     PROGRESS NOTE    Subjective/Complaints:  no new issues. Slept well. Used cpap. Eating breakfast. Appetite good.  ROS: Pt denies fever, rash/itching, headache, blurred or double vision, nausea, vomiting, abdominal pain, diarrhea, chest pain, shortness of breath, palpitations, dysuria,   bleeding, anxiety, or depression   Objective: Vital Signs: Blood pressure 156/66, pulse 50, temperature 97.5 F (36.4 C), temperature source Oral, resp. rate 18, height 5\' 9"  (1.753 m), weight 101.9 kg (224 lb 10.4 oz), SpO2 98 %. No results found. No results for input(s): WBC, HGB, HCT, PLT in the last 72 hours. No results for input(s): NA, K, CL, GLUCOSE, BUN, CREATININE, CALCIUM in the last 72 hours.  Invalid input(s): CO CBG (last 3)   Recent Labs  12/22/14 1620 12/22/14 2042 12/23/14 0632  GLUCAP 198* 267* 207*    Wt Readings from Last 3 Encounters:  12/12/14 101.9 kg (224 lb 10.4 oz)  12/11/14 102.649 kg (226 lb 4.8 oz)  12/01/14 99.474 kg (219 lb 4.8 oz)    Physical Exam:  Constitutional: He is oriented to person, place, and time. He appears well-developed.  HENT: oral mucosa pink and moist Head: Normocephalic.  Eyes: EOM are normal.  Neck: Normal range of motion. Neck supple. No thyromegaly present.  Cardiovascular: Normal rate and regular rhythm. rare pac if any today Respiratory: Effort normal and breath sounds normal. No respiratory distress. No wheezes or rales GI: Soft. Bowel sounds are normal. He exhibits no distension.  Neurological: He is alert and oriented to person, place, and time.  Continued diminished LT/PP in bilateral lower extremities from upper thigh and distally. Decreased gross coordination of both legs, right more than left. RLE, : 4/5 hf,   4ke and 4- adf/apf. LLE: 4/5 hf, 4ke and 4-adf/apf. Bilateral UE: 5/5 deltoid, bicep, tricep, wrist, HI. Marland Kitchen Cognitively he's appropriate with normal insight and  awareness.    Psychiatric: He has a normal mood and affect. His behavior is normal  Assessment/Plan: 1. 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 Physiatrist is providing close team supervision and 24 hour management of active medical problems listed below. Physiatrist and rehab team continue to assess barriers to discharge/monitor patient progress toward functional and medical goals.  Function:  Bathing Bathing position   Position: Shower  Bathing parts Body parts bathed by patient: Right arm, Left arm, Chest, Abdomen, Front perineal area, Buttocks, Right upper leg, Left upper leg, Right lower leg, Left lower leg, Back    Bathing assist Assist Level: Supervision or verbal cues      Upper Body Dressing/Undressing Upper body dressing   What is the patient wearing?: Pull over shirt/dress     Pull over shirt/dress - Perfomed by patient: Thread/unthread right sleeve, Thread/unthread left sleeve, Put head through opening, Pull shirt over trunk          Upper body assist Assist Level: No help, No cues      Lower Body Dressing/Undressing Lower body dressing   What is the patient wearing?: Shoes, Socks Underwear - Performed by patient: Thread/unthread right underwear leg, Thread/unthread left underwear leg, Pull underwear up/down   Pants- Performed by patient: Thread/unthread right pants leg, Thread/unthread left pants leg, Pull pants up/down       Socks - Performed by patient: Don/doff right sock, Don/doff left sock Socks - Performed by helper: Don/doff right  sock, Don/doff left sock Shoes - Performed by patient: Don/doff right shoe, Don/doff left shoe, Fasten right, Fasten left Shoes - Performed by helper: Don/doff right shoe, Don/doff left shoe, Fasten right, Fasten left          Lower body assist Assist Level:  Touching or steadying assistance (Pt > 75%)      Toileting Toileting Toileting activity did not occur: N/A Toileting steps completed by patient: Adjust clothing prior to toileting, Performs perineal hygiene, Adjust clothing after toileting   Toileting Assistive Devices: Grab bar or rail  Toileting assist Assist level: Supervision or verbal cues   Transfers Chair/bed transfer   Chair/bed transfer method: Ambulatory Chair/bed transfer assist level: Supervision or verbal cues Chair/bed transfer assistive device: Armrests, Medical sales representative     Max distance: 150 Assist level: Supervision or verbal cues   Wheelchair Wheelchair activity did not occur: N/A Type: Manual Max wheelchair distance: 150 Assist Level: Supervision or verbal cues  Cognition Comprehension Comprehension assist level: Follows complex conversation/direction with extra time/assistive device  Expression Expression assist level: Expresses complex ideas: With extra time/assistive device  Social Interaction Social Interaction assist level: Interacts appropriately 90% of the time - Needs monitoring or encouragement for participation or interaction.  Problem Solving Problem solving assist level: Solves complex 90% of the time/cues < 10% of the time  Memory Memory assist level: Recognizes or recalls 90% of the time/requires cueing < 10% of the time   Medical Problem List and Plan: 1. 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 -continue CIR therapies.  2. DVT Prophylaxis/Anticoagulation: SCDs. ambulating 3. Pain Management: Hydrocodone and Robaxin as needed. Monitor with increased mobility 4. Mood: Lexapro 10 mg daily. Provide emotional support 5. Neuropsych: This patient is capable of making decisions on his own behalf. 6. Skin/Wound Care: surgical wound healing nicely. 7. Fluids/Electrolytes/Nutrition: encourage po 8.  Hypertension. Norvasc 10 mg daily, Avapro 150 mg daily. bp's elevated this am [id number] systolic, may need increased avapro 9. Constipation/neurogenic bowel: bowels sluggish but he's generally continent. Encourage fluids 10. Hyperlipidemia. Crestor 11. History of gout. Allopurinol 300 mg daily. no flareups 12. BPH with history of prostate cancer/hematuria. Flomax 0.4 mg daily, Ditropan 15 mg daily at bedtime.   -emptying fairly frequently near baseline 13. CAD with stenting. Resume aspirin and Plavix -therapy to monitor for symptoms with increased exertion  - EKG with PAC's as on exam 14. Hyperglycemia: hgb A1C 6.9  -now on carb mod diet  -reduced decadron to  2mg  q12 on Sunday---decrease to 1mg  q12 tomorrow then off at discharge    -glucophage increased to bid  -diabetic education, will need glucose monitor  -sugars improving.  -outpt folllow up    15. Dizziness: improved 16.  Asymptomatic bradycardia, EKG 12/15/14 normal with rate 64, no BB on board, will monitor, HR 50  LOS (Days) 11 A FACE TO FACE EVALUATION WAS PERFORMED  Magaline Steinberg T 12/23/2014 8:00 AM

## 2014-12-23 NOTE — Progress Notes (Signed)
Pt. Able to place cpap on himself. RT filled pt. Water reservoir up.

## 2014-12-24 ENCOUNTER — Encounter (HOSPITAL_COMMUNITY): Payer: Medicare Other | Admitting: *Deleted

## 2014-12-24 ENCOUNTER — Inpatient Hospital Stay (HOSPITAL_COMMUNITY): Payer: Medicare Other | Admitting: Occupational Therapy

## 2014-12-24 ENCOUNTER — Inpatient Hospital Stay (HOSPITAL_COMMUNITY): Payer: Medicare Other | Admitting: Physical Therapy

## 2014-12-24 LAB — GLUCOSE, CAPILLARY
Glucose-Capillary: 207 mg/dL — ABNORMAL HIGH (ref 65–99)
Glucose-Capillary: 90 mg/dL (ref 65–99)
Glucose-Capillary: 120 mg/dL — ABNORMAL HIGH (ref 65–99)
Glucose-Capillary: 176 mg/dL — ABNORMAL HIGH (ref 65–99)

## 2014-12-24 MED ORDER — DEXAMETHASONE 2 MG PO TABS
1.0000 mg | ORAL_TABLET | Freq: Two times a day (BID) | ORAL | Status: DC
  Administered 2014-12-24: 1 mg via ORAL
  Filled 2014-12-24 (×2): qty 1

## 2014-12-24 NOTE — Patient Care Conference (Signed)
Inpatient RehabilitationTeam Conference and Plan of Care Update Date: 12/23/2014   Time: 2:05 PM    Patient Name: [name]      Medical Record Number: [id number]  Date of Birth: March 19, 1936 Sex: Male         Room/Bed: 4M06C/4M06C-01 Payor Info: Payor: MEDICARE / Plan: MEDICARE PART A AND B / Product Type: *No Product type* /    Admitting Diagnosis: STERN THORACIC EPENDYMOMA  Admit Date/Time:  12/12/2014  4:12 PM Admission Comments: No comment available   Primary Diagnosis:  Ependymoma of spinal cord Principal Problem: Ependymoma of spinal cord  Patient Active Problem List   Diagnosis Date Noted  . Diabetes type 2, uncontrolled   . Thoracic spine tumor 12/12/2014  . Neurogenic bowel 12/12/2014  . Incomplete paraplegia 12/12/2014  . Back pain 12/09/2014  . Malignant tumor spinal cord 12/09/2014  . Pre-operative cardiovascular examination 09/17/2014  . Other fatigue 09/17/2014  . Chest pain with moderate risk for cardiac etiology 09/17/2014  . Ependymoma of spinal cord 11/01/2013  . Unspecified hereditary and idiopathic peripheral neuropathy 11/01/2013  . Diastolic dysfunction without heart failure   . Obesity (BMI 30-39.9) 03/22/2013  . OSA on CPAP 03/19/2012  . H/O prostate cancer 03/19/2012  . Essential hypertension 03/19/2012  . Hyperlipidemia LDL goal <70 03/19/2012  . H/O hydrocele 03/19/2012  . Hx of melanoma excision 03/19/2012  . Personal history of colonic polyps 03/19/2012  . CAD S/P percutaneous coronary angioplasty: Cypher DES x2 to circumflex 08/17/2004    Expected Discharge Date: Expected Discharge Date: 12/25/14  Team Members Present: Physician leading conference: Dr. Alger Simons Social Worker Present: Lennart Pall, LCSW Nurse Present: Dorien Chihuahua, RN PT Present: Canary Brim, PT OT Present: Napoleon Form, OT PPS Coordinator present : Daiva Nakayama, RN, CRRN     Current Status/Progress Goal Weekly Team Focus  Medical   occasional dizziness. improved  balance, impulsivity better?  see prior, education  see prior   Bowel/Bladder   continent of bowel and bladder; LBM 9/18  remain continent and no issues with constipaiton  minimal assist   Swallow/Nutrition/ Hydration             ADL's   Supervision Overall, Requires VCs for safety awareness  Overall supervision- mod I  Family training, d/c planning   Mobility   S overall  mod I transfers, S gait and stairs  safety, coordination and balance, gait, stairs   Communication             Safety/Cognition/ Behavioral Observations            Pain   no c/o of pain  <3 on a 0-10 pain scale  assess pain q 4 hr and prn   Skin   incision to back with honeycomb dressing in place  no new skin breakdown/infection while on rehab  assess skin q shift and prn    Rehab Goals Patient on target to meet rehab goals: Yes *See Care Plan and progress notes for long and short-term goals.  Barriers to Discharge: safety awareness, sensory deficits    Possible Resolutions to Barriers:  ongoing safety strategies and family ed    Discharge Planning/Teaching Needs:  home with wife able to provide 24/7 assistance      Team Discussion:  On target to meet goals.  Family ed completed yesterday.  Impulsivity some improved (but worsened with wife - she was able to redirect).  Recommending HH follow up to start.  No concerns.  Revisions to  Treatment Plan:  None   Continued Need for Acute Rehabilitation Level of Care: The patient requires daily medical management by a physician with specialized training in physical medicine and rehabilitation for the following conditions: Daily direction of a multidisciplinary physical rehabilitation program to ensure safe treatment while eliciting the highest outcome that is of practical value to the patient.: Yes Daily medical management of patient stability for increased activity during participation in an intensive rehabilitation regime.: Yes Daily analysis of laboratory  values and/or radiology reports with any subsequent need for medication adjustment of medical intervention for : Post surgical problems;Neurological problems;Other  [name] 12/24/2014, 9:14 AM

## 2014-12-24 NOTE — Progress Notes (Signed)
Physical Therapy Session Note  Patient Details  Name: [name] MRN: [id number] Date of Birth: 01/11/1936  Today's Date: 12/24/2014 PT Individual Time: 1500-1600 PT Individual Time Calculation (min): 60 min   Short Term Goals: Week 2:  PT Short Term Goal 1 (Week 2): = LTGs  Skilled Therapeutic Interventions/Progress Updates:    Pt received up on eob with wife present intermittently throughout PT session. Gait Training - see function tab for details in controlled environment on level surface, stairs, and uneven surface. Therapeutic Activity - see function tab for car transfer (simulated [employer] height) req verbal cues for hand placement not to hold onto door. Pt attempts reaching towards floor to pick up pen off the ground, but is unable, only able to reach halfway down while holding RW and PT providing CGA for safety. Bed mobility and transfers in function tab for details. PT provides pt many real life scenarios where he may be breaking spinal precautions: while holding // bars and tap dancing (no pivoting body side to side b/c this is twisting), holding 2 hands on 1 stairrail as this causes twisting, getting in and out of recumbent stationary bike which could cause twisting and positioning while in recumbent bike as this could cause flexing, turning body to talk to people he must make sure to turn toes to face whomever he is speaking to to avoid twisting. Therapeutic Exercise: PT instructs pt in 2 sets of 10-15 reps of HEP: LAQ 5 second holds, heel raises 1-5 second holds as pt is fatigued, mini-squats with verbal cues for technique, standing hip abduction, standing knee flexion with cues to keep R knee pointed at the ground. Pt ended eob and pt and wife have no concerns about planned d/c tomorrow. Cont per PT POC.   Therapy Documentation Precautions:  Precautions Precautions: Back, Fall Precaution Comments: Lifting/pushing/pulling limit; no  bending/arching/twisting Restrictions Weight Bearing Restrictions: No Pain:  Pt denies pain.    See Function Navigator for Current Functional Status.   Therapy/Group: Individual Therapy  Daniell Mancinas M 12/24/2014, 3:43 PM

## 2014-12-24 NOTE — Progress Notes (Signed)
Occupational Therapy Session Note  Patient Details  Name: [name] MRN: [id number] Date of Birth: February 28, 1936  Today's Date: 12/24/2014 OT Individual Time: [id number] OT Individual Time Calculation (min): 55 min    Short Term Goals: Week 2:  OT Short Term Goal 1 (Week 2): STG=LTG due to LOS  Skilled Therapeutic Interventions/Progress Updates:    Pt seen for OT ADL bathing and dressing session. Pt in supine upon arrival, agreeable to tx session. He ambulated throughout room using RW with supervision to gather clothing items. He bathed seated on shower chair, standing with supervision to complete buttock hygiene. Education provided throughout session regarding decreasing risk of falls and safety awareness. He dressed seated EOB completing sit <> stands with supervision at [place]. He ambulated throughout unit with RW and supervision to ADL apartment where he completed simulated tub shower transfer using bench and toilet transfer with grab bar. VCs provided for sequencing and  RW management. In therapy gym, pt completed nut/bolt activity, required to stand on foam mat and use B UEs to fasten bolts. Steadying- supervision required with min cuing for weight shift. Pt ambulated back to room at end of session and returned to supine. Pt left in supine with all needs in reach.  Pt unable to recall spinal pre-cautions, requiring VCs to list BAT pre-cautions. He demonstrated attention to precautions during functional tasks however. Cont to provide education to pt regarding reducing fall risk, safety with mobility, and d/c planning.   Therapy Documentation Precautions:  Precautions Precautions: Back, Fall Precaution Comments: Lifting/pushing/pulling limit; no bending/arching/twisting Restrictions Weight Bearing Restrictions: No Pain: Pain Assessment Pain Assessment: No/denies pain  See Function Navigator for Current Functional Status.   Therapy/Group: Individual Therapy  Lewis, Truda Staub  C 12/24/2014, 7:07 AM

## 2014-12-24 NOTE — Plan of Care (Signed)
Problem: RH Stairs Goal: LTG Patient will ambulate up and down stairs w/assist (PT) LTG: Patient will ambulate up and down # of stairs with assistance (PT)  Outcome: Not Met (add Reason) R knee buckled and pt req min A to prevent fall.

## 2014-12-24 NOTE — Discharge Summary (Signed)
Discharge summary job [id number]

## 2014-12-24 NOTE — Progress Notes (Signed)
Inpatient Diabetes Program Recommendations  AACE/ADA: New Consensus Statement on Inpatient Glycemic Control (2015)  Target Ranges:  Prepandial:   less than 140 mg/dL      Peak postprandial:   less than 180 mg/dL (1-2 hours)      Critically ill patients:  140 - 180 mg/dL   Review of Glycemic Control  Diabetes history: DM 2 Outpatient Diabetes medications: None Current orders for Inpatient glycemic control: Metformin 500 mg BID pt to be discharged on, Novolog moderate + HS   Consult Note: Received Consult for new DM diagnosis and home management.   Patient's A1c 6.9% on 12/01/2014. [facility] and visited  On 5/13, 5/26, 6/2. Due to A1c level at that time per his physician was 7.1%. His PCP at that time referred him for lifestyle management to control his DM. Patient has had the proper education about DM and home management. I have attached information to the patient on checking his glucose and being on Metformin. I have also included sick day management (which involves not taking the metformin). Noted that patient is still receiving decadron which may be elevating his glucose levels still. Agree with the Metformin dosing in relation to the decadron the patient is receiving.  MD If patient is being discharged on Metformin, please order them a glucose meter kit to check glucose levels BID. Once in the am and alternating the time of the second check (before lunch, before dinner, at bedtime). Order # [id number].  Thanks,  Tama Headings RN, MSN, [facility] Inpatient Diabetes Coordinator Team Pager [phone] (8a-5p)

## 2014-12-24 NOTE — Progress Notes (Signed)
Physical Therapy Discharge Summary  Patient Details  Name: [name] MRN: [id number] Date of Birth: Jan 04, 1936  Patient has met 7 of 8 long term goals due to improved activity tolerance, improved balance, increased strength, ability to compensate for deficits, improved awareness and improved coordination.  Patient to discharge at an ambulatory level Supervision to modified independent using a RW.   Patient's wife is independent to provide the necessary supervision and cueing assistance at discharge and successfully completed family education.  Reasons goals not met: Pt's R knee buckled when ascending stairs and req min A to prevent a fall.   Recommendation:  Patient will benefit from ongoing skilled PT services in home health setting to continue to advance safe functional mobility, address ongoing impairments in gait, balance, safety awareness, endurance, strength, and minimize fall risk.  Equipment: RW  Reasons for discharge: treatment goals met and discharge from hospital  Patient/family agrees with progress made and goals achieved: Yes  PT Discharge Precautions/Restrictions Precautions Precautions: Back;Fall Precaution Comments: Lifting/pushing/pulling limit; no bending/arching/twisting Restrictions Weight Bearing Restrictions: No     Cognition Overall Cognitive Status: Within Functional Limits for tasks assessed Arousal/Alertness: Awake/alert Orientation Level: Oriented X4 Memory: Impaired Memory Impairment: Decreased recall of new information Awareness: Appears intact Behaviors: Impulsive Safety/Judgment: Appears intact Comments: Impulsive Sensation Sensation Light Touch: Impaired Detail Light Touch Impaired Details: Impaired LLE;Impaired RLE Proprioception: Impaired Detail Proprioception Impaired Details: Impaired LLE;Impaired RLE Coordination Gross Motor Movements are Fluid and Coordinated: No Fine Motor Movements are Fluid and Coordinated: Yes Motor   Motor Motor: Ataxia  Trunk/Postural Assessment  Cervical Assessment Cervical Assessment: Within Functional Limits Thoracic Assessment Thoracic Assessment: Exceptions to [facility] (back precautions) Lumbar Assessment Lumbar Assessment: Exceptions to [facility] (back precautions) Postural Control Postural Control: Within Functional Limits (with UE support)  Balance Balance Balance Assessed: Yes Static Sitting Balance Static Sitting - Level of Assistance: 6: Modified independent (Device/Increase time) Static Standing - Level of Assistance: 6: Modified independent (Device/Increase time) Dynamic Standing Balance Dynamic Standing - Balance Support: During functional activity Dynamic Standing - Level of Assistance: 5: Stand by assistance Extremity Assessment  RUE Assessment RUE Assessment: Within Functional Limits LUE Assessment LUE Assessment: Within Functional Limits RLE Assessment RLE Assessment:  (grossly 4+/5; decreased coordination with gait) LLE Assessment LLE Assessment:  (grossly 4+/5; decreased coordination with gait)   See Function Navigator for Current Functional Status.  Canary Brim Ivory Broad, PT, DPT  12/24/2014, 8:08 AM

## 2014-12-24 NOTE — Progress Notes (Signed)
Social Work Patient ID: [name], male   DOB: 11/03/1935, 79 y.o.   MRN: [id number]   Lowella Curb, LCSW Social Worker Signed  Patient Care Conference 12/24/2014  9:14 AM    Expand All Collapse All   Inpatient RehabilitationTeam Conference and Plan of Care Update Date: 12/23/2014   Time: 2:05 PM     Patient Name: [name]       Medical Record Number: [id number]  Date of Birth: 08-13-1935 Sex: Male         Room/Bed: 4M06C/4M06C-01 Payor Info: Payor: MEDICARE / Plan: MEDICARE PART A AND B / Product Type: *No Product type* /    Admitting Diagnosis: STERN THORACIC EPENDYMOMA   Admit Date/Time:  12/12/2014  4:12 PM Admission Comments: No comment available   Primary Diagnosis:  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  Diabetes type 2, uncontrolled     .  Thoracic spine tumor  12/12/2014   .  Neurogenic bowel  12/12/2014   .  Incomplete paraplegia  12/12/2014   .  Back pain  12/09/2014   .  Malignant tumor spinal cord  12/09/2014   .  Pre-operative cardiovascular examination  09/17/2014   .  Other fatigue  09/17/2014   .  Chest pain with moderate risk for cardiac etiology  09/17/2014   .  Ependymoma of spinal cord  11/01/2013   .  Unspecified hereditary and idiopathic peripheral neuropathy  11/01/2013   .  Diastolic dysfunction without heart failure     .  Obesity (BMI 30-39.9)  03/22/2013   .  OSA on CPAP  03/19/2012   .  H/O prostate cancer  03/19/2012   .  Essential hypertension  03/19/2012   .  Hyperlipidemia LDL goal <70  03/19/2012   .  H/O hydrocele  03/19/2012   .  Hx of melanoma excision  03/19/2012   .  Personal history of colonic polyps  03/19/2012   .  CAD S/P percutaneous coronary angioplasty: Cypher DES x2 to circumflex  08/17/2004     Expected Discharge Date: Expected Discharge Date: 12/25/14  Team Members Present: Physician leading conference: Dr. Alger Simons Social Worker Present: Lennart Pall, LCSW Nurse Present: Dorien Chihuahua, RN PT Present: Canary Brim, PT OT Present: Napoleon Form, OT PPS Coordinator present : Daiva Nakayama, RN, CRRN        Current Status/Progress  Goal  Weekly Team Focus   Medical     occasional dizziness. improved balance, impulsivity better?   see prior, education  see prior   Bowel/Bladder     continent of bowel and bladder; LBM 9/18  remain continent and no issues with constipaiton  minimal assist   Swallow/Nutrition/ Hydration               ADL's     Supervision Overall, Requires VCs for safety awareness   Overall supervision- mod I  Family training, d/c planning   Mobility     S overall  mod I transfers, S gait and stairs   safety, coordination and balance, gait, stairs    Communication               Safety/Cognition/ Behavioral Observations              Pain     no c/o of pain  <3 on a 0-10 pain scale  assess pain q 4 hr and prn   Skin     incision to back with honeycomb dressing in place   no new skin breakdown/infection while on rehab   assess skin q shift and prn    Rehab Goals Patient on target to meet rehab goals: Yes *See Care Plan and progress notes for long and short-term goals.    Barriers to Discharge:  safety awareness, sensory deficits      Possible Resolutions to Barriers:   ongoing safety strategies and family ed      Discharge Planning/Teaching Needs:   home with wife able to provide 24/7 assistance        Team Discussion:    On target to meet goals.  Family ed completed yesterday.  Impulsivity some improved (but worsened with wife - she was able to redirect).  Recommending HH follow up to start.  No concerns.   Revisions to Treatment Plan:    None    Continued Need for Acute Rehabilitation Level of Care: The patient requires daily medical management by a physician with specialized training in physical medicine and rehabilitation for the following conditions: Daily direction  of a multidisciplinary physical rehabilitation program to ensure safe treatment while eliciting the highest outcome that is of practical value to the patient.: Yes Daily medical management of patient stability for increased activity during participation in an intensive rehabilitation regime.: Yes Daily analysis of laboratory values and/or radiology reports with any subsequent need for medication adjustment of medical intervention for : Post surgical problems;Neurological problems;Marda Stalker 12/24/2014, 9:14 AM                 Lowella Curb, LCSW Social Worker Signed  Patient Care Conference 12/16/2014  5:08 PM    Expand All Collapse All   Inpatient RehabilitationTeam Conference and Plan of Care Update Date: 12/16/2014   Time: 2:15  PM     Patient Name: [name]       Medical Record Number: [id number]  Date of Birth: [birth year] Sex: Male         Room/Bed: 4M06C/4M06C-01 Payor Info: Payor: MEDICARE / Plan: MEDICARE PART A AND B / Product Type: *No Product type* /    Admitting Diagnosis: STERN THORACIC EPENDYMOMA   Admit Date/Time:  12/12/2014  4:12 PM Admission Comments: No comment available   Primary Diagnosis:  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  Diabetes type 2, uncontrolled     .  Thoracic spine tumor  12/12/2014   .  Neurogenic bowel  12/12/2014   .  Incomplete paraplegia  12/12/2014   .  Back pain  12/09/2014   .  Malignant tumor spinal cord  12/09/2014   .  Pre-operative cardiovascular examination  09/17/2014   .  Other fatigue  09/17/2014   .  Chest pain with moderate risk for cardiac etiology  09/17/2014   .  Ependymoma of spinal cord  11/01/2013   .  Unspecified hereditary and idiopathic peripheral neuropathy  11/01/2013   .  Diastolic dysfunction without heart failure     .  Obesity (BMI 30-39.9)  03/22/2013   .  OSA on CPAP  03/19/2012   .  H/O prostate  cancer  03/19/2012   .  Essential hypertension  03/19/2012   .  Hyperlipidemia LDL goal <70  03/19/2012   .  H/O hydrocele  03/19/2012   .  Hx of melanoma excision  03/19/2012   .  Personal history of colonic polyps  03/19/2012   .  CAD S/P percutaneous coronary angioplasty: Cypher DES x2 to circumflex  08/17/2004     Expected Discharge Date: Expected Discharge Date: 12/25/14  Team Members Present: Physician leading conference: Dr. Alger Simons Social Worker Present: Lennart Pall, LCSW Nurse Present: Heather Roberts, RN PT Present: Raylene Everts, PT OT Present: Napoleon Form, OT SLP Present: Weston Anna, SLP PPS Coordinator present : Ileana Ladd, PT        Current Status/Progress  Goal  Weekly Team Focus   Medical     thoracic ependymoma with myelopathy, neurogenic bowel and bladder  improve focus/concentration  safety, balance   Bowel/Bladder     continent of bowel and bladder; LBM  9/12  remain continent and no issues with constipaiton  minimal assist   Swallow/Nutrition/ Hydration               ADL's     Steadying assist bathing/ dressing, Min A functional transfers, max VCs for safety awareness during functional tasks.   Overall supervision- mod I  Standing balance, functional ambulation, functional transfers    Mobility     Min-mod A overall; very impulsive    mod I transfers, supervision gait and stairs; may downgrade to all supervision if safety issues continue  Safety, coordination and motor control; gait, balance    Communication               Safety/Cognition/ Behavioral Observations              Pain     no c/o of pain  <3 on a 0-10 pain scale  assess pain q 4hr and PRN   Skin     incision to back with honeycomb dressing in place   no new skin breakdown/infection while on rehab   assess skin q shift and PRN    Rehab Goals Patient on target to meet rehab goals: Yes *See Care Plan and progress notes for long and short-term goals.    Barriers to Discharge:   impulsivity, decreased proprioception      Possible Resolutions to Barriers:   supervision     Discharge Planning/Teaching Needs:   home with wife able to provide 24/7 assistance        Team Discussion:    Medically doing well.  Clarifying back precautions with MD.  Pt showing impulsivity which is affecting his safety.  Want wife to come in during tx to see this concern first hand.  Supervision/ mod i goals overall   Revisions to Treatment Plan:    None    Continued Need for Acute Rehabilitation Level of Care: The patient requires daily medical management by a physician with specialized training in physical medicine and rehabilitation for the following conditions: Daily direction of a multidisciplinary physical rehabilitation program to ensure safe treatment while eliciting the highest outcome that is of practical value to the patient.: Yes Daily medical management of patient stability for increased activity during participation in an intensive rehabilitation regime.: Yes Daily analysis of laboratory values and/or radiology reports with any subsequent need for medication adjustment of medical intervention for : Post surgical problems;Neurological problems  Nanie Dunkleberger 12/16/2014, 5:08 PM

## 2014-12-24 NOTE — Progress Notes (Signed)
Occupational Therapy Discharge Summary  Patient Details  Name: [name] MRN: [id number] Date of Birth: 01-20-1936   Patient has met 6 of 6 long term goals due to improved activity tolerance, improved balance, postural control, ability to compensate for deficits and improved coordination.  Patient to discharge at overall Supervision level.  Patient's care partner is independent to provide the necessary physical assistance at discharge.    Recommendation:  Patient will benefit from ongoing skilled OT services in home health setting to continue to advance functional skills in the area of BADL and iADL.  Equipment: 3-1 BSC and tub transfer bench  Reasons for discharge: treatment goals met and discharge from hospital  Patient/family agrees with progress made and goals achieved: Yes  OT Discharge Precautions/Restrictions  Precautions Precautions: Back;Fall Precaution Comments: Lifting/pushing/pulling limit; no bending/arching/twisting Restrictions Weight Bearing Restrictions: No   Vision/Perception  Vision- History Baseline Vision/History: Wears glasses Wears Glasses: Reading only Patient Visual Report: No change from baseline Vision- Assessment Vision Assessment?: No apparent visual deficits  Cognition Overall Cognitive Status: Within Functional Limits for tasks assessed Arousal/Alertness: Awake/alert Orientation Level: Oriented X4 Memory: Impaired Memory Impairment: Decreased recall of new information Awareness: Appears intact Behaviors: Impulsive Safety/Judgment: Appears intact Comments: Impulsive Sensation Sensation Light Touch: Impaired Detail Light Touch Impaired Details: Impaired LLE;Impaired RLE Proprioception: Impaired Detail Proprioception Impaired Details: Impaired LLE;Impaired RLE Coordination Gross Motor Movements are Fluid and Coordinated: No Fine Motor Movements are Fluid and Coordinated: Yes Motor  Motor Motor: Ataxia  Trunk/Postural Assessment   Cervical Assessment Cervical Assessment: Exceptions to [facility] (Forward flexion) Thoracic Assessment Thoracic Assessment: Within Functional Limits Lumbar Assessment Lumbar Assessment: Within Functional Limits Postural Control Postural Control: Within Functional Limits  Balance Balance Balance Assessed: Yes Static Sitting Balance Static Sitting - Balance Support: Feet supported Static Standing Balance Static Standing - Balance Support: During functional activity Static Standing - Level of Assistance: 5: Stand by assistance Static Standing - Comment/# of Minutes: Standing at sink to compete grooming task Dynamic Standing Balance Dynamic Standing - Balance Support: During functional activity Dynamic Standing - Level of Assistance: 5: Stand by assistance Dynamic Standing - Balance Activities: Lateral lean/weight shifting;Forward lean/weight shifting;Reaching across midline;Reaching for objects Extremity/Trunk Assessment RUE Assessment RUE Assessment: Within Functional Limits LUE Assessment LUE Assessment: Within Functional Limits   See Function Navigator for Current Functional Status.  [name], Charles Salais C 12/24/2014, 7:26 AM

## 2014-12-24 NOTE — Discharge Summary (Signed)
NAMEZENDE, REICHARDT NO.:  [id number]  MEDICAL RECORD NO.:  [id number]  LOCATION:  4M06C                        FACILITY:  [facility]  PHYSICIAN:  Meredith Staggers, M.D.DATE OF BIRTH:  Mar 18, 1936  DATE OF ADMISSION:  12/12/2014 DATE OF DISCHARGE:                              DISCHARGE SUMMARY   DISCHARGE DIAGNOSES: 1. Functional deficits secondary to thoracic ependymoma status post     resection on December 09, 2014. 2. SCDs for DVT prophylaxis. 3. Pain management. 4. Depression. 5. Hypertension. 6. Constipation with neurogenic bowel. 7. Hyperlipidemia. 8. History of gout. 9. Benign prostatic hyperplasia with history of prostate cancer. 10.Coronary artery disease with stenting. 11.Hyperglycemia.  HISTORY OF PRESENT ILLNESS:  This is a 79 year old right-handed male with history of coronary artery disease with stenting, prostate cancer as well as resection of thoracic intra-medullary tumor in April of 2014. He lives with his wife, independent until recently.  Presented on December 09, 2014, with gait abnormality and numbness.  Recent MRI imaging of thoracic spine showed progression of abnormal signal enhancement extending T10-T11 ependymoma increased from comparison March 07, 2014.  Underwent redo thoracic laminectomy, intramedullary tumor resection on December 09, 2014, per Dr. Vertell Limber.  Hospital course, pain management, Decadron protocol.  Pathology report pending.  Bouts of hematuria felt to be related to Foley catheter tube irritation, removed and resolved.  Physical and occupational therapy ongoing.  The patient was admitted for a comprehensive rehab program.  PAST MEDICAL HISTORY:  See discharge diagnoses.  SOCIAL HISTORY:  Lives with his wife, independent prior to admission. Functional history upon admission to rehab services was moderate assist, ambulate 15 feet, rolling walker; mod assist, sit to stand; min to mod assist, activities of daily  living.  PHYSICAL EXAMINATION:  VITAL SIGNS:  Blood pressure 150/66, pulse 63, temperature 97, respirations 18. GENERAL:  This was an alert male, oriented x3. LUNGS:  Clear to auscultation. CARDIAC:  Regular rate and rhythm. ABDOMEN:  Soft, nontender.  Good bowel sounds. Back incision clean and dry.  Decreased light touch, pinprick bilateral lower extremities from upper thigh distally, decrease gross coordination of both legs.  Arlee COURSE:  The patient was admitted to inpatient rehab services with therapies initiated on a 3-hour daily basis consisting of physical therapy, occupational therapy, and rehabilitation nursing.  The following issues were addressed during the patient's rehabilitation stay.  Pertaining Mr. Liebelt thoracic ependymoma, he had undergone resection per Dr. Vertell Limber, plan to follow up as outpatient.  He continue with therapies with progressive gains.  SCDs for DVT prophylaxis.  No signs of DVT.  Pain management, use of hydrocodone and Robaxin with good results.  He continued on Lexapro for depression.  Emotional support provided.  He was attending all therapies.  Blood pressures monitored, no orthostatic changes.  He would follow up with his primary MD.  Constipation, neurogenic bowel.  Bowels were sluggish, but he generally was continent.  His voiding had greatly improved with history of BPH.  He continued on Ditropan as well as Flomax.  No chest pain or shortness of breath.  Aspirin and Plavix ongoing for history of coronary artery disease with stenting.  Had some mild elevations in blood  sugars felt to be related to his Decadron and monitored with hemoglobin A1c 6.9. He was on low dose Glucophage did receive diabetic teaching. The patient received weekly collaborative interdisciplinary team conferences to discuss estimated length of stay, family teaching, and any barriers to his discharge.  He was ambulating 500 feet rolling walker,  occasional cues for upright posture, fatigue and endurance continued to improve.  Stair climbing demonstrated and explained with his wife performing stairs, modified independent.  Ambulate back to his room with rolling walker on supervision.  He could gather his belongings for activities of daily living, dressing, grooming, and homemaking. Ambulating throughout his room, rolling walker supervision.  Full family teaching was completed and plan discharge to home.  DISCHARGE MEDICATIONS: 1. Allopurinol 300 mg p.o. daily. 2. Norvasc 10 mg p.o. daily. 3. Aspirin 81 mg p.o. daily. 4. Plavix 75 mg p.o. daily. 5. Decadron taper as advised. 6. Colace 100 mg p.o. b.i.d. 7. Lexapro 10 mg p.o. daily. 8. Hydrocodone 1-2 tablets every 4 hours as needed pain, dispense of     90 tablets. 9. Avapro 150 mg p.o. daily. 10.Glucophage 500 mg p.o. b.i.d. 11.Robaxin 500 mg p.o. every 6 hours as needed muscle spasms. 12.Ditropan 15 mg p.o. daily. 13.Protonix 40 mg p.o. daily. 14.Crestor 20 mg p.o. daily. 15.Flomax 0.4 mg p.o. at bedtime.  DIET:  Diabetic diet.  FOLLOWUP:  The patient would follow up with Dr. Alger Simons at the outpatient rehab service office as directed; Dr. Erline Levine, call for appointment; and Dr. Lavone Orn, medical management.     Lauraine Rinne, P.A.   ______________________________ Meredith Staggers, M.D.    DA/MEDQ  D:  12/24/2014  T:  12/24/2014  Job:  [id number]  cc:   Delanna Ahmadi, M.D. Marchia Meiers. Vertell Limber, M.D.

## 2014-12-24 NOTE — Progress Notes (Signed)
[facility] PHYSICAL MEDICINE & REHABILITATION     PROGRESS NOTE    Subjective/Complaints:  no new complaints. Feels well. Excited to be going home. Very appreciative of staff  ROS: Pt denies fever, rash/itching, headache, blurred or double vision, nausea, vomiting, abdominal pain, diarrhea, chest pain, shortness of breath, palpitations, dysuria,   bleeding, anxiety, or depression   Objective: Vital Signs: Blood pressure 160/62, pulse 53, temperature 98.3 F (36.8 C), temperature source Oral, resp. rate 18, height 5\' 9"  (1.753 m), weight 101.9 kg (224 lb 10.4 oz), SpO2 98 %. No results found. No results for input(s): WBC, HGB, HCT, PLT in the last 72 hours. No results for input(s): NA, K, CL, GLUCOSE, BUN, CREATININE, CALCIUM in the last 72 hours.  Invalid input(s): CO CBG (last 3)   Recent Labs  12/23/14 1652 12/23/14 2101 12/24/14 0652  GLUCAP 225* 192* 207*    Wt Readings from Last 3 Encounters:  12/12/14 101.9 kg (224 lb 10.4 oz)  12/11/14 102.649 kg (226 lb 4.8 oz)  12/01/14 99.474 kg (219 lb 4.8 oz)    Physical Exam:  Constitutional: He is oriented to person, place, and time. He appears well-developed.  HENT: oral mucosa pink and moist Head: Normocephalic.  Eyes: EOM are normal.  Neck: Normal range of motion. Neck supple. No thyromegaly present.  Cardiovascular: Normal rate and regular rhythm. rare pac if any today Respiratory: Effort normal and breath sounds normal. No respiratory distress. No wheezes or rales GI: Soft. Bowel sounds are normal. He exhibits no distension.  Neurological: He is alert and oriented to person, place, and time.  Continued diminished LT/PP in bilateral lower extremities from upper thigh and distally. Decreased gross coordination of both legs, right more than left. RLE, : 4/5 hf,   4ke and 4- adf/apf. LLE: 4/5 hf, 4ke and 4-adf/apf. Bilateral UE: 5/5 deltoid, bicep, tricep, wrist, HI.  Cognitively he's appropriate with normal  insight and awareness.    Psychiatric: He has a normal mood and affect. His behavior is normal  Assessment/Plan: 1. 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 Physiatrist is providing close team supervision and 24 hour management of active medical problems listed below. Physiatrist and rehab team continue to assess barriers to discharge/monitor patient progress toward functional and medical goals.  Function:  Bathing Bathing position   Position: Shower  Bathing parts Body parts bathed by patient: Right arm, Left arm, Chest, Abdomen, Front perineal area, Buttocks, Right upper leg, Left upper leg, Right lower leg, Left lower leg, Back    Bathing assist Assist Level: Supervision or verbal cues      Upper Body Dressing/Undressing Upper body dressing   What is the patient wearing?: Pull over shirt/dress     Pull over shirt/dress - Perfomed by patient: Thread/unthread right sleeve, Thread/unthread left sleeve, Put head through opening, Pull shirt over trunk          Upper body assist Assist Level: No help, No cues      Lower Body Dressing/Undressing Lower body dressing   What is the patient wearing?: Shoes, Socks, Underwear, Pants Underwear - Performed by patient: Thread/unthread right underwear leg, Thread/unthread left underwear leg, Pull underwear up/down   Pants- Performed by patient: Thread/unthread right pants leg, Thread/unthread left pants leg, Pull pants up/down       Socks - Performed by patient: Don/doff right sock, Don/doff left sock Socks -  Performed by helper: Don/doff right sock, Don/doff left sock Shoes - Performed by patient: Don/doff right shoe, Don/doff left shoe, Fasten right, Fasten left Shoes - Performed by helper: Don/doff right shoe, Don/doff left shoe, Fasten right, Fasten left           Lower body assist Assist Level: Touching or steadying assistance (Pt > 75%)      Toileting Toileting Toileting activity did not occur: N/A Toileting steps completed by patient: Adjust clothing prior to toileting, Performs perineal hygiene, Adjust clothing after toileting   Toileting Assistive Devices: Grab bar or rail  Toileting assist Assist level: Set up/obtain supplies   Transfers Chair/bed transfer   Chair/bed transfer method: Ambulatory Chair/bed transfer assist level: Supervision or verbal cues Chair/bed transfer assistive device: Armrests, Medical sales representative     Max distance: 250 Assist level: Supervision or verbal cues   Wheelchair Wheelchair activity did not occur: N/A Type: Manual Max wheelchair distance: 150 Assist Level: Supervision or verbal cues  Cognition Comprehension Comprehension assist level: Follows complex conversation/direction with extra time/assistive device  Expression Expression assist level: Expresses complex ideas: With extra time/assistive device  Social Interaction Social Interaction assist level: Interacts appropriately 90% of the time - Needs monitoring or encouragement for participation or interaction.  Problem Solving Problem solving assist level: Solves basic 90% of the time/requires cueing < 10% of the time  Memory Memory assist level: Recognizes or recalls 90% of the time/requires cueing < 10% of the time   Medical Problem List and Plan: 1. 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 -continue CIR therapies.  2. DVT Prophylaxis/Anticoagulation: SCDs. ambulating 3. Pain Management: Hydrocodone and Robaxin as needed. Monitor with increased mobility 4. Mood: Lexapro 10 mg daily. Provide emotional support 5. Neuropsych: This patient is capable of making decisions on his own behalf. 6. Skin/Wound Care: surgical wound healing nicely. 7.  Fluids/Electrolytes/Nutrition: encourage po 8. Hypertension. Norvasc 10 mg daily, Avapro 150 mg daily. bp's elevated this am [id number] systolic, may need increased avapro 9. Constipation/neurogenic bowel: bowels sluggish but he's generally continent. Encourage fluids 10. Hyperlipidemia. Crestor 11. History of gout. Allopurinol 300 mg daily. no flareups 12. BPH with history of prostate cancer/hematuria. Flomax 0.4 mg daily, Ditropan 15 mg daily at bedtime.   -emptying fairly frequently near baseline 13. CAD with stenting. Resume aspirin and Plavix -therapy to monitor for symptoms with increased exertion  - EKG with PAC's as on exam 14. Hyperglycemia: hgb A1C 6.9  -now on carb mod diet  -reduce to 1mg  q12     -glucophage increased to bid  -diabetic education, will need glucose monitor  -sugars improving.  -outpt folllow up    15. Dizziness: improved 16.  Asymptomatic bradycardia, EKG 12/15/14 normal with rate 64, no BB on board, will monitor, HR 50  LOS (Days) 12 A FACE TO FACE EVALUATION WAS PERFORMED  Robyn Nohr T 12/24/2014 8:46 AM

## 2014-12-24 NOTE — Progress Notes (Signed)
Social Work Patient ID: [name], male   DOB: 11-24-1935, 79 y.o.   MRN: [id number]   Met yesterday afternoon with pt and wife to review team conf info.  Both pleased with gains and feeling ready for d/c tomorrow.  Have ordered [facility] and DME.  Continue to follow.  Shelita Steptoe, LCSW

## 2014-12-24 NOTE — Progress Notes (Signed)
Physical Therapy Session Note  Patient Details  Name: [name] MRN: [id number] Date of Birth: October 09, 1935  Today's Date: 12/24/2014 PT Concurrent Time: 1030-1200 PT Concurrent Time Calculation (min): 90 min  Pt participated in therapeutic outing to [employer] to address community mobility including gait, balance, safety awareness, and endurance as well as address energy conservation and community re-entry. Pt at overall S level for mobility except min A for van stairs. Cues throughout for safety with mobility and attention. See goal sheet for details.  Therapy Documentation Precautions:  Precautions Precautions: Back, Fall Precaution Comments: Lifting/pushing/pulling limit; no bending/arching/twisting Restrictions Weight Bearing Restrictions: No  See Function Navigator for Current Functional Status.   Therapy/Group: Community Reintegration: Outing  Canary Brim Ivory Broad, PT, DPT  12/24/2014, 1:15 PM

## 2014-12-25 LAB — GLUCOSE, CAPILLARY: Glucose-Capillary: 145 mg/dL — ABNORMAL HIGH (ref 65–99)

## 2014-12-25 MED ORDER — AMLODIPINE BESYLATE 10 MG PO TABS: 10.0000 mg | ORAL_TABLET | Freq: Every day | ORAL | Status: DC

## 2014-12-25 MED ORDER — TAMSULOSIN HCL 0.4 MG PO CAPS: 0.4000 mg | ORAL_CAPSULE | Freq: Every day | ORAL | Status: AC

## 2014-12-25 MED ORDER — METFORMIN HCL 500 MG PO TABS
500.0000 mg | ORAL_TABLET | Freq: Two times a day (BID) | ORAL | Status: DC
Start: 2014-12-25 — End: 2015-02-24

## 2014-12-25 MED ORDER — ROSUVASTATIN CALCIUM 20 MG PO TABS
20.0000 mg | ORAL_TABLET | Freq: Every day | ORAL | Status: DC
Start: 2014-12-25 — End: 2017-12-27

## 2014-12-25 MED ORDER — ESCITALOPRAM OXALATE 10 MG PO TABS: 10.0000 mg | ORAL_TABLET | Freq: Every day | ORAL | Status: DC

## 2014-12-25 MED ORDER — VALSARTAN 80 MG PO TABS: 80.0000 mg | ORAL_TABLET | Freq: Every day | ORAL | Status: DC

## 2014-12-25 MED ORDER — CLOPIDOGREL BISULFATE 75 MG PO TABS: 75.0000 mg | ORAL_TABLET | Freq: Every day | ORAL | Status: DC

## 2014-12-25 MED ORDER — PANTOPRAZOLE SODIUM 40 MG PO TBEC: 40.0000 mg | DELAYED_RELEASE_TABLET | Freq: Every day | ORAL | Status: DC

## 2014-12-25 MED ORDER — ALLOPURINOL 300 MG PO TABS: 300.0000 mg | ORAL_TABLET | Freq: Every day | ORAL | Status: DC

## 2014-12-25 MED ORDER — METHOCARBAMOL 500 MG PO TABS: 500.0000 mg | ORAL_TABLET | Freq: Four times a day (QID) | ORAL | Status: DC | PRN

## 2014-12-25 MED ORDER — HYDROCODONE-ACETAMINOPHEN 5-325 MG PO TABS: 1.0000 | ORAL_TABLET | ORAL | Status: DC | PRN

## 2014-12-25 MED ORDER — OXYBUTYNIN CHLORIDE ER 15 MG PO TB24: 15.0000 mg | ORAL_TABLET | Freq: Every day | ORAL | Status: AC

## 2014-12-25 MED ORDER — DOCUSATE SODIUM 100 MG PO CAPS: 100.0000 mg | ORAL_CAPSULE | Freq: Two times a day (BID) | ORAL | Status: DC

## 2014-12-25 NOTE — Progress Notes (Signed)
Social Work Discharge Note  The overall goal for the admission was met for:   Discharge location: Yes - home with wife able to provide 24/7 assistance  Length of Stay: Yes - 13 days  Discharge activity level: Yes - modified independent  Home/community participation: Yes  Services provided included: MD, RD, PT, OT, RN, TR, Pharmacy and [place]: Medicare and Private Insurance: [place]  Follow-up services arranged: Home Health: PT, OT via [facility], DME: rolling walker, 3n1 commode and tub bench via [facility] [place] and Patient/Family has no preference for HH/DME agencies  Comments (or additional information):  Patient/Family verbalized understanding of follow-up arrangements: Yes  Individual responsible for coordination of the follow-up plan: pt  Confirmed correct DME delivered: Malakye Nolden 12/25/2014    Christerpher Clos

## 2014-12-25 NOTE — Discharge Instructions (Signed)
Inpatient Rehab Discharge Instructions  [name] Discharge date and time: No discharge date for patient encounter.   Activities/Precautions/ Functional Status: Activity: activity as tolerated Diet: diabetic diet Wound Care: keep wound clean and dry Functional status:  ___ No restrictions     ___ Walk up steps independently ___ 24/7 supervision/assistance   ___ Walk up steps with assistance ___ Intermittent supervision/assistance  ___ Bathe/dress independently ___ Walk with walker     ___ Bathe/dress with assistance ___ Walk Independently    ___ Shower independently _x__ Walk with assistance    ___ Shower with assistance ___ No alcohol     ___ Return to work/school ________    COMMUNITY REFERRALS UPON DISCHARGE:    Home Health:   PT     OT                     Agency:  [facility] Phone: [phone]    Medical Equipment/Items Ordered:  Rolling walker, commode and tub bench                                                      Agency/Supplier:  [place] @ [phone]     Special Instructions:    My questions have been answered and I understand these instructions. I will adhere to these goals and the provided educational materials after my discharge from the hospital.  Patient/Caregiver Signature _______________________________ Date __________  Clinician Signature _______________________________________ Date __________  Please bring this form and your medication list with you to all your follow-up doctor's appointments.

## 2014-12-25 NOTE — Progress Notes (Signed)
Pt. Got d/c instructions,prescriptions and follow up appointments.Pt. Ready to go home

## 2014-12-25 NOTE — Progress Notes (Addendum)
[facility] PHYSICAL MEDICINE & REHABILITATION     PROGRESS NOTE    Subjective/Complaints:  feeling well. Very pleased with progress. No problems overnight  ROS: Pt denies fever, rash/itching, headache, blurred or double vision, nausea, vomiting, abdominal pain, diarrhea, chest pain, shortness of breath, palpitations, dysuria,   bleeding, anxiety, or depression   Objective: Vital Signs: Blood pressure 144/69, pulse 88, temperature 97.8 F (36.6 C), temperature source Oral, resp. rate 16, height 5\' 9"  (1.753 m), weight 97.478 kg (214 lb 14.4 oz), SpO2 97 %. No results found. No results for input(s): WBC, HGB, HCT, PLT in the last 72 hours. No results for input(s): NA, K, CL, GLUCOSE, BUN, CREATININE, CALCIUM in the last 72 hours.  Invalid input(s): CO CBG (last 3)   Recent Labs  12/24/14 1633 12/24/14 2039 12/25/14 0638  GLUCAP 120* 176* 145*    Wt Readings from Last 3 Encounters:  12/24/14 97.478 kg (214 lb 14.4 oz)  12/11/14 102.649 kg (226 lb 4.8 oz)  12/01/14 99.474 kg (219 lb 4.8 oz)    Physical Exam:  Constitutional: He is oriented to person, place, and time. He appears well-developed.  HENT: oral mucosa pink and moist Head: Normocephalic.  Eyes: EOM are normal.  Neck: Normal range of motion. Neck supple. No thyromegaly present.  Cardiovascular: Normal rate and regular rhythm. rare pac if any today Respiratory: Effort normal and breath sounds normal. No respiratory distress. No wheezes or rales GI: Soft. Bowel sounds are normal. He exhibits no distension.  Neurological: He is alert and oriented to person, place, and time.  Continued diminished LT/PP in bilateral lower extremities from upper thigh and distally. Decreased gross coordination of both legs, right more than left. RLE, : 4/5 hf,   4ke and 4- adf/apf. LLE: 4/5 hf, 4ke and 4-adf/apf. Bilateral UE: 5/5 deltoid, bicep, tricep, wrist, HI.  Cognitively he's appropriate with normal insight and awareness.     Psychiatric: He has a normal mood and affect. His behavior is normal  Assessment/Plan: 1. 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 Physiatrist is providing close team supervision and 24 hour management of active medical problems listed below. Physiatrist and rehab team continue to assess barriers to discharge/monitor patient progress toward functional and medical goals.  Function:  Bathing Bathing position   Position: Shower  Bathing parts Body parts bathed by patient: Right arm, Left arm, Chest, Abdomen, Front perineal area, Buttocks, Right upper leg, Left upper leg, Right lower leg, Left lower leg, Back    Bathing assist Assist Level: Supervision or verbal cues      Upper Body Dressing/Undressing Upper body dressing   What is the patient wearing?: Pull over shirt/dress     Pull over shirt/dress - Perfomed by patient: Thread/unthread right sleeve, Thread/unthread left sleeve, Put head through opening, Pull shirt over trunk          Upper body assist Assist Level: No help, No cues      Lower Body Dressing/Undressing Lower body dressing   What is the patient wearing?: Shoes, Socks, Underwear, Pants Underwear - Performed by patient: Thread/unthread right underwear leg, Thread/unthread left underwear leg, Pull underwear up/down   Pants- Performed by patient: Thread/unthread right pants leg, Thread/unthread left pants leg, Pull pants up/down       Socks - Performed by patient: Don/doff right sock, Don/doff left sock Socks - Performed by helper: Don/doff right  sock, Don/doff left sock Shoes - Performed by patient: Don/doff right shoe, Don/doff left shoe, Fasten right, Fasten left Shoes - Performed by helper: Don/doff right shoe, Don/doff left shoe, Fasten right, Fasten left          Lower body assist Assist  Level: Supervision or verbal cues      Toileting Toileting Toileting activity did not occur: N/A Toileting steps completed by patient: Adjust clothing prior to toileting, Performs perineal hygiene, Adjust clothing after toileting   Toileting Assistive Devices: Grab bar or rail  Toileting assist Assist level: Supervision or verbal cues   Transfers Chair/bed transfer   Chair/bed transfer method: Ambulatory Chair/bed transfer assist level: No Help, no cues, assistive device, takes more than a reasonable amount of time Chair/bed transfer assistive device: Medical sales representative     Max distance: 200' Assist level: No help, No cues, assistive device, takes more than a reasonable amount of time   Wheelchair Wheelchair activity did not occur: N/A Type: Manual Max wheelchair distance: 150 Assist Level: Supervision or verbal cues  Cognition Comprehension Comprehension assist level: Follows complex conversation/direction with extra time/assistive device  Expression Expression assist level: Expresses complex ideas: With extra time/assistive device  Social Interaction Social Interaction assist level: Interacts appropriately 90% of the time - Needs monitoring or encouragement for participation or interaction.  Problem Solving Problem solving assist level: Solves basic 90% of the time/requires cueing < 10% of the time  Memory Memory assist level: Recognizes or recalls 90% of the time/requires cueing < 10% of the time   Medical Problem List and Plan: 1. 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 -home today with [facility]  -md follow up with ns,pcp,pmr 2. DVT Prophylaxis/Anticoagulation: SCDs. ambulating 3. Pain Management: Hydrocodone and Robaxin as needed. Monitor with increased mobility 4. Mood: Lexapro 10 mg daily. Provide emotional support 5. Neuropsych: This patient is capable of making  decisions on his own behalf. 6. Skin/Wound Care: surgical wound cdi. 7. Fluids/Electrolytes/Nutrition: encourage po 8. Hypertension. Norvasc 10 mg daily, Avapro 150 mg daily. bp's elevated this am [id number] systolic, may need increased avapro 9. Constipation/neurogenic bowel: bowels sluggish but he's generally continent. Encourage fluids 10. Hyperlipidemia. Crestor 11. History of gout. Allopurinol 300 mg daily. no flareups 12. BPH with history of prostate cancer/hematuria. Flomax 0.4 mg daily, Ditropan 15 mg daily at bedtime.   -emptying fairly frequently near baseline 13. CAD with stenting.  continue aspirin and Plavix -therapy to monitor for symptoms with increased exertion  - EKG with PAC's as on exam 14. Hyperglycemia: hgb A1C 6.9  -now on carb mod diet  -steroids stopped    -glucophage 500mg   Bid--this will be home dose.  -appreciated DM RN note/recs,   glucose monitor has already been requested  -sugars much improved  -outpt folllow up by pcp    15. Dizziness: improved 16.  Asymptomatic bradycardia, EKG 12/15/14 normal with rate 64, no BB on board, will monitor, HR 50  LOS (Days) 13 A FACE TO FACE EVALUATION WAS PERFORMED  Airik Goodlin T 12/25/2014 8:20 AM

## 2014-12-26 DIAGNOSIS — G8222 Paraplegia, incomplete: Secondary | ICD-10-CM | POA: Diagnosis not present

## 2014-12-26 DIAGNOSIS — Z483 Aftercare following surgery for neoplasm: Secondary | ICD-10-CM | POA: Diagnosis not present

## 2014-12-26 DIAGNOSIS — E1165 Type 2 diabetes mellitus with hyperglycemia: Secondary | ICD-10-CM | POA: Diagnosis not present

## 2014-12-26 DIAGNOSIS — E1140 Type 2 diabetes mellitus with diabetic neuropathy, unspecified: Secondary | ICD-10-CM | POA: Diagnosis not present

## 2014-12-26 DIAGNOSIS — I2510 Atherosclerotic heart disease of native coronary artery without angina pectoris: Secondary | ICD-10-CM | POA: Diagnosis not present

## 2014-12-26 DIAGNOSIS — C720 Malignant neoplasm of spinal cord: Secondary | ICD-10-CM | POA: Diagnosis not present

## 2014-12-29 DIAGNOSIS — Z483 Aftercare following surgery for neoplasm: Secondary | ICD-10-CM | POA: Diagnosis not present

## 2014-12-29 DIAGNOSIS — I2510 Atherosclerotic heart disease of native coronary artery without angina pectoris: Secondary | ICD-10-CM | POA: Diagnosis not present

## 2014-12-29 DIAGNOSIS — E1165 Type 2 diabetes mellitus with hyperglycemia: Secondary | ICD-10-CM | POA: Diagnosis not present

## 2014-12-29 DIAGNOSIS — C720 Malignant neoplasm of spinal cord: Secondary | ICD-10-CM | POA: Diagnosis not present

## 2014-12-29 DIAGNOSIS — G8222 Paraplegia, incomplete: Secondary | ICD-10-CM | POA: Diagnosis not present

## 2014-12-29 DIAGNOSIS — E1140 Type 2 diabetes mellitus with diabetic neuropathy, unspecified: Secondary | ICD-10-CM | POA: Diagnosis not present

## 2014-12-30 DIAGNOSIS — I2510 Atherosclerotic heart disease of native coronary artery without angina pectoris: Secondary | ICD-10-CM | POA: Diagnosis not present

## 2014-12-30 DIAGNOSIS — E1165 Type 2 diabetes mellitus with hyperglycemia: Secondary | ICD-10-CM | POA: Diagnosis not present

## 2014-12-30 DIAGNOSIS — E1140 Type 2 diabetes mellitus with diabetic neuropathy, unspecified: Secondary | ICD-10-CM | POA: Diagnosis not present

## 2014-12-30 DIAGNOSIS — C720 Malignant neoplasm of spinal cord: Secondary | ICD-10-CM | POA: Diagnosis not present

## 2014-12-30 DIAGNOSIS — Z483 Aftercare following surgery for neoplasm: Secondary | ICD-10-CM | POA: Diagnosis not present

## 2014-12-30 DIAGNOSIS — G8222 Paraplegia, incomplete: Secondary | ICD-10-CM | POA: Diagnosis not present

## 2014-12-31 DIAGNOSIS — C720 Malignant neoplasm of spinal cord: Secondary | ICD-10-CM | POA: Diagnosis not present

## 2014-12-31 DIAGNOSIS — Z483 Aftercare following surgery for neoplasm: Secondary | ICD-10-CM | POA: Diagnosis not present

## 2014-12-31 DIAGNOSIS — G8222 Paraplegia, incomplete: Secondary | ICD-10-CM | POA: Diagnosis not present

## 2014-12-31 DIAGNOSIS — I2510 Atherosclerotic heart disease of native coronary artery without angina pectoris: Secondary | ICD-10-CM | POA: Diagnosis not present

## 2014-12-31 DIAGNOSIS — E1165 Type 2 diabetes mellitus with hyperglycemia: Secondary | ICD-10-CM | POA: Diagnosis not present

## 2014-12-31 DIAGNOSIS — E1140 Type 2 diabetes mellitus with diabetic neuropathy, unspecified: Secondary | ICD-10-CM | POA: Diagnosis not present

## 2015-01-01 DIAGNOSIS — Z483 Aftercare following surgery for neoplasm: Secondary | ICD-10-CM | POA: Diagnosis not present

## 2015-01-01 DIAGNOSIS — C720 Malignant neoplasm of spinal cord: Secondary | ICD-10-CM | POA: Diagnosis not present

## 2015-01-01 DIAGNOSIS — E1165 Type 2 diabetes mellitus with hyperglycemia: Secondary | ICD-10-CM | POA: Diagnosis not present

## 2015-01-01 DIAGNOSIS — I2510 Atherosclerotic heart disease of native coronary artery without angina pectoris: Secondary | ICD-10-CM | POA: Diagnosis not present

## 2015-01-01 DIAGNOSIS — G8222 Paraplegia, incomplete: Secondary | ICD-10-CM | POA: Diagnosis not present

## 2015-01-01 DIAGNOSIS — E1140 Type 2 diabetes mellitus with diabetic neuropathy, unspecified: Secondary | ICD-10-CM | POA: Diagnosis not present

## 2015-01-02 DIAGNOSIS — C720 Malignant neoplasm of spinal cord: Secondary | ICD-10-CM | POA: Diagnosis not present

## 2015-01-02 DIAGNOSIS — Z483 Aftercare following surgery for neoplasm: Secondary | ICD-10-CM | POA: Diagnosis not present

## 2015-01-02 DIAGNOSIS — G8222 Paraplegia, incomplete: Secondary | ICD-10-CM | POA: Diagnosis not present

## 2015-01-02 DIAGNOSIS — E1140 Type 2 diabetes mellitus with diabetic neuropathy, unspecified: Secondary | ICD-10-CM | POA: Diagnosis not present

## 2015-01-02 DIAGNOSIS — E1165 Type 2 diabetes mellitus with hyperglycemia: Secondary | ICD-10-CM | POA: Diagnosis not present

## 2015-01-02 DIAGNOSIS — I2510 Atherosclerotic heart disease of native coronary artery without angina pectoris: Secondary | ICD-10-CM | POA: Diagnosis not present

## 2015-01-05 DIAGNOSIS — C720 Malignant neoplasm of spinal cord: Secondary | ICD-10-CM | POA: Diagnosis not present

## 2015-01-05 DIAGNOSIS — G8222 Paraplegia, incomplete: Secondary | ICD-10-CM | POA: Diagnosis not present

## 2015-01-05 DIAGNOSIS — Z483 Aftercare following surgery for neoplasm: Secondary | ICD-10-CM | POA: Diagnosis not present

## 2015-01-05 DIAGNOSIS — E1140 Type 2 diabetes mellitus with diabetic neuropathy, unspecified: Secondary | ICD-10-CM | POA: Diagnosis not present

## 2015-01-05 DIAGNOSIS — I2510 Atherosclerotic heart disease of native coronary artery without angina pectoris: Secondary | ICD-10-CM | POA: Diagnosis not present

## 2015-01-05 DIAGNOSIS — E1165 Type 2 diabetes mellitus with hyperglycemia: Secondary | ICD-10-CM | POA: Diagnosis not present

## 2015-01-06 DIAGNOSIS — C720 Malignant neoplasm of spinal cord: Secondary | ICD-10-CM | POA: Diagnosis not present

## 2015-01-06 DIAGNOSIS — E1140 Type 2 diabetes mellitus with diabetic neuropathy, unspecified: Secondary | ICD-10-CM | POA: Diagnosis not present

## 2015-01-06 DIAGNOSIS — Z483 Aftercare following surgery for neoplasm: Secondary | ICD-10-CM | POA: Diagnosis not present

## 2015-01-06 DIAGNOSIS — E1165 Type 2 diabetes mellitus with hyperglycemia: Secondary | ICD-10-CM | POA: Diagnosis not present

## 2015-01-06 DIAGNOSIS — G8222 Paraplegia, incomplete: Secondary | ICD-10-CM | POA: Diagnosis not present

## 2015-01-06 DIAGNOSIS — I2510 Atherosclerotic heart disease of native coronary artery without angina pectoris: Secondary | ICD-10-CM | POA: Diagnosis not present

## 2015-01-07 DIAGNOSIS — C720 Malignant neoplasm of spinal cord: Secondary | ICD-10-CM | POA: Diagnosis not present

## 2015-01-07 DIAGNOSIS — E119 Type 2 diabetes mellitus without complications: Secondary | ICD-10-CM | POA: Diagnosis not present

## 2015-01-08 DIAGNOSIS — E1140 Type 2 diabetes mellitus with diabetic neuropathy, unspecified: Secondary | ICD-10-CM | POA: Diagnosis not present

## 2015-01-08 DIAGNOSIS — I2510 Atherosclerotic heart disease of native coronary artery without angina pectoris: Secondary | ICD-10-CM | POA: Diagnosis not present

## 2015-01-08 DIAGNOSIS — G8222 Paraplegia, incomplete: Secondary | ICD-10-CM | POA: Diagnosis not present

## 2015-01-08 DIAGNOSIS — C720 Malignant neoplasm of spinal cord: Secondary | ICD-10-CM | POA: Diagnosis not present

## 2015-01-08 DIAGNOSIS — Z483 Aftercare following surgery for neoplasm: Secondary | ICD-10-CM | POA: Diagnosis not present

## 2015-01-08 DIAGNOSIS — E1165 Type 2 diabetes mellitus with hyperglycemia: Secondary | ICD-10-CM | POA: Diagnosis not present

## 2015-01-09 DIAGNOSIS — E1165 Type 2 diabetes mellitus with hyperglycemia: Secondary | ICD-10-CM | POA: Diagnosis not present

## 2015-01-09 DIAGNOSIS — G8222 Paraplegia, incomplete: Secondary | ICD-10-CM | POA: Diagnosis not present

## 2015-01-09 DIAGNOSIS — Z483 Aftercare following surgery for neoplasm: Secondary | ICD-10-CM | POA: Diagnosis not present

## 2015-01-09 DIAGNOSIS — I2510 Atherosclerotic heart disease of native coronary artery without angina pectoris: Secondary | ICD-10-CM | POA: Diagnosis not present

## 2015-01-09 DIAGNOSIS — E1140 Type 2 diabetes mellitus with diabetic neuropathy, unspecified: Secondary | ICD-10-CM | POA: Diagnosis not present

## 2015-01-09 DIAGNOSIS — C720 Malignant neoplasm of spinal cord: Secondary | ICD-10-CM | POA: Diagnosis not present

## 2015-01-12 DIAGNOSIS — E1165 Type 2 diabetes mellitus with hyperglycemia: Secondary | ICD-10-CM | POA: Diagnosis not present

## 2015-01-12 DIAGNOSIS — G8222 Paraplegia, incomplete: Secondary | ICD-10-CM | POA: Diagnosis not present

## 2015-01-12 DIAGNOSIS — E1140 Type 2 diabetes mellitus with diabetic neuropathy, unspecified: Secondary | ICD-10-CM | POA: Diagnosis not present

## 2015-01-12 DIAGNOSIS — C720 Malignant neoplasm of spinal cord: Secondary | ICD-10-CM | POA: Diagnosis not present

## 2015-01-12 DIAGNOSIS — Z483 Aftercare following surgery for neoplasm: Secondary | ICD-10-CM | POA: Diagnosis not present

## 2015-01-12 DIAGNOSIS — I2510 Atherosclerotic heart disease of native coronary artery without angina pectoris: Secondary | ICD-10-CM | POA: Diagnosis not present

## 2015-01-14 DIAGNOSIS — E1165 Type 2 diabetes mellitus with hyperglycemia: Secondary | ICD-10-CM | POA: Diagnosis not present

## 2015-01-14 DIAGNOSIS — G8222 Paraplegia, incomplete: Secondary | ICD-10-CM | POA: Diagnosis not present

## 2015-01-14 DIAGNOSIS — Z483 Aftercare following surgery for neoplasm: Secondary | ICD-10-CM | POA: Diagnosis not present

## 2015-01-14 DIAGNOSIS — C720 Malignant neoplasm of spinal cord: Secondary | ICD-10-CM | POA: Diagnosis not present

## 2015-01-14 DIAGNOSIS — E1140 Type 2 diabetes mellitus with diabetic neuropathy, unspecified: Secondary | ICD-10-CM | POA: Diagnosis not present

## 2015-01-14 DIAGNOSIS — I2510 Atherosclerotic heart disease of native coronary artery without angina pectoris: Secondary | ICD-10-CM | POA: Diagnosis not present

## 2015-01-16 DIAGNOSIS — C720 Malignant neoplasm of spinal cord: Secondary | ICD-10-CM | POA: Diagnosis not present

## 2015-01-16 DIAGNOSIS — G8222 Paraplegia, incomplete: Secondary | ICD-10-CM | POA: Diagnosis not present

## 2015-01-16 DIAGNOSIS — E1165 Type 2 diabetes mellitus with hyperglycemia: Secondary | ICD-10-CM | POA: Diagnosis not present

## 2015-01-16 DIAGNOSIS — I2510 Atherosclerotic heart disease of native coronary artery without angina pectoris: Secondary | ICD-10-CM | POA: Diagnosis not present

## 2015-01-16 DIAGNOSIS — E1140 Type 2 diabetes mellitus with diabetic neuropathy, unspecified: Secondary | ICD-10-CM | POA: Diagnosis not present

## 2015-01-16 DIAGNOSIS — Z483 Aftercare following surgery for neoplasm: Secondary | ICD-10-CM | POA: Diagnosis not present

## 2015-01-19 DIAGNOSIS — C720 Malignant neoplasm of spinal cord: Secondary | ICD-10-CM | POA: Diagnosis not present

## 2015-01-19 DIAGNOSIS — Z483 Aftercare following surgery for neoplasm: Secondary | ICD-10-CM | POA: Diagnosis not present

## 2015-01-19 DIAGNOSIS — E1165 Type 2 diabetes mellitus with hyperglycemia: Secondary | ICD-10-CM | POA: Diagnosis not present

## 2015-01-19 DIAGNOSIS — E1140 Type 2 diabetes mellitus with diabetic neuropathy, unspecified: Secondary | ICD-10-CM | POA: Diagnosis not present

## 2015-01-19 DIAGNOSIS — G8222 Paraplegia, incomplete: Secondary | ICD-10-CM | POA: Diagnosis not present

## 2015-01-19 DIAGNOSIS — I2510 Atherosclerotic heart disease of native coronary artery without angina pectoris: Secondary | ICD-10-CM | POA: Diagnosis not present

## 2015-01-21 DIAGNOSIS — C720 Malignant neoplasm of spinal cord: Secondary | ICD-10-CM | POA: Diagnosis not present

## 2015-01-21 DIAGNOSIS — Z483 Aftercare following surgery for neoplasm: Secondary | ICD-10-CM | POA: Diagnosis not present

## 2015-01-21 DIAGNOSIS — G8222 Paraplegia, incomplete: Secondary | ICD-10-CM | POA: Diagnosis not present

## 2015-01-21 DIAGNOSIS — E1165 Type 2 diabetes mellitus with hyperglycemia: Secondary | ICD-10-CM | POA: Diagnosis not present

## 2015-01-21 DIAGNOSIS — I2510 Atherosclerotic heart disease of native coronary artery without angina pectoris: Secondary | ICD-10-CM | POA: Diagnosis not present

## 2015-01-21 DIAGNOSIS — E1140 Type 2 diabetes mellitus with diabetic neuropathy, unspecified: Secondary | ICD-10-CM | POA: Diagnosis not present

## 2015-01-22 DIAGNOSIS — E1140 Type 2 diabetes mellitus with diabetic neuropathy, unspecified: Secondary | ICD-10-CM | POA: Diagnosis not present

## 2015-01-22 DIAGNOSIS — Z483 Aftercare following surgery for neoplasm: Secondary | ICD-10-CM | POA: Diagnosis not present

## 2015-01-22 DIAGNOSIS — E1165 Type 2 diabetes mellitus with hyperglycemia: Secondary | ICD-10-CM | POA: Diagnosis not present

## 2015-01-22 DIAGNOSIS — I2510 Atherosclerotic heart disease of native coronary artery without angina pectoris: Secondary | ICD-10-CM | POA: Diagnosis not present

## 2015-01-22 DIAGNOSIS — G8222 Paraplegia, incomplete: Secondary | ICD-10-CM | POA: Diagnosis not present

## 2015-01-22 DIAGNOSIS — C720 Malignant neoplasm of spinal cord: Secondary | ICD-10-CM | POA: Diagnosis not present

## 2015-01-27 DIAGNOSIS — E1140 Type 2 diabetes mellitus with diabetic neuropathy, unspecified: Secondary | ICD-10-CM | POA: Diagnosis not present

## 2015-01-27 DIAGNOSIS — G8222 Paraplegia, incomplete: Secondary | ICD-10-CM | POA: Diagnosis not present

## 2015-01-27 DIAGNOSIS — C720 Malignant neoplasm of spinal cord: Secondary | ICD-10-CM | POA: Diagnosis not present

## 2015-01-27 DIAGNOSIS — E1165 Type 2 diabetes mellitus with hyperglycemia: Secondary | ICD-10-CM | POA: Diagnosis not present

## 2015-01-27 DIAGNOSIS — Z483 Aftercare following surgery for neoplasm: Secondary | ICD-10-CM | POA: Diagnosis not present

## 2015-01-27 DIAGNOSIS — I2510 Atherosclerotic heart disease of native coronary artery without angina pectoris: Secondary | ICD-10-CM | POA: Diagnosis not present

## 2015-01-29 DIAGNOSIS — E1165 Type 2 diabetes mellitus with hyperglycemia: Secondary | ICD-10-CM | POA: Diagnosis not present

## 2015-01-29 DIAGNOSIS — C720 Malignant neoplasm of spinal cord: Secondary | ICD-10-CM | POA: Diagnosis not present

## 2015-01-29 DIAGNOSIS — E1140 Type 2 diabetes mellitus with diabetic neuropathy, unspecified: Secondary | ICD-10-CM | POA: Diagnosis not present

## 2015-01-29 DIAGNOSIS — I2510 Atherosclerotic heart disease of native coronary artery without angina pectoris: Secondary | ICD-10-CM | POA: Diagnosis not present

## 2015-01-29 DIAGNOSIS — G8222 Paraplegia, incomplete: Secondary | ICD-10-CM | POA: Diagnosis not present

## 2015-01-29 DIAGNOSIS — Z483 Aftercare following surgery for neoplasm: Secondary | ICD-10-CM | POA: Diagnosis not present

## 2015-01-30 DIAGNOSIS — E1165 Type 2 diabetes mellitus with hyperglycemia: Secondary | ICD-10-CM | POA: Diagnosis not present

## 2015-01-30 DIAGNOSIS — Z483 Aftercare following surgery for neoplasm: Secondary | ICD-10-CM | POA: Diagnosis not present

## 2015-01-30 DIAGNOSIS — C720 Malignant neoplasm of spinal cord: Secondary | ICD-10-CM | POA: Diagnosis not present

## 2015-01-30 DIAGNOSIS — G8222 Paraplegia, incomplete: Secondary | ICD-10-CM | POA: Diagnosis not present

## 2015-01-30 DIAGNOSIS — E1140 Type 2 diabetes mellitus with diabetic neuropathy, unspecified: Secondary | ICD-10-CM

## 2015-01-30 DIAGNOSIS — I2510 Atherosclerotic heart disease of native coronary artery without angina pectoris: Secondary | ICD-10-CM

## 2015-02-03 ENCOUNTER — Encounter: Payer: Medicare Other | Attending: Physical Medicine & Rehabilitation | Admitting: Physical Medicine & Rehabilitation

## 2015-02-03 ENCOUNTER — Encounter: Payer: Self-pay | Admitting: Physical Medicine & Rehabilitation

## 2015-02-03 DIAGNOSIS — E1140 Type 2 diabetes mellitus with diabetic neuropathy, unspecified: Secondary | ICD-10-CM | POA: Diagnosis not present

## 2015-02-03 DIAGNOSIS — K592 Neurogenic bowel, not elsewhere classified: Secondary | ICD-10-CM | POA: Diagnosis not present

## 2015-02-03 DIAGNOSIS — I2510 Atherosclerotic heart disease of native coronary artery without angina pectoris: Secondary | ICD-10-CM

## 2015-02-03 DIAGNOSIS — Z85820 Personal history of malignant melanoma of skin: Secondary | ICD-10-CM | POA: Diagnosis not present

## 2015-02-03 DIAGNOSIS — F329 Major depressive disorder, single episode, unspecified: Secondary | ICD-10-CM | POA: Insufficient documentation

## 2015-02-03 DIAGNOSIS — R319 Hematuria, unspecified: Secondary | ICD-10-CM | POA: Diagnosis not present

## 2015-02-03 DIAGNOSIS — C719 Malignant neoplasm of brain, unspecified: Secondary | ICD-10-CM | POA: Insufficient documentation

## 2015-02-03 DIAGNOSIS — M109 Gout, unspecified: Secondary | ICD-10-CM | POA: Diagnosis not present

## 2015-02-03 DIAGNOSIS — Z9861 Coronary angioplasty status: Secondary | ICD-10-CM

## 2015-02-03 DIAGNOSIS — Z955 Presence of coronary angioplasty implant and graft: Secondary | ICD-10-CM | POA: Diagnosis not present

## 2015-02-03 DIAGNOSIS — N401 Enlarged prostate with lower urinary tract symptoms: Secondary | ICD-10-CM | POA: Diagnosis not present

## 2015-02-03 DIAGNOSIS — Z79899 Other long term (current) drug therapy: Secondary | ICD-10-CM | POA: Insufficient documentation

## 2015-02-03 DIAGNOSIS — G8220 Paraplegia, unspecified: Secondary | ICD-10-CM | POA: Insufficient documentation

## 2015-02-03 DIAGNOSIS — C720 Malignant neoplasm of spinal cord: Secondary | ICD-10-CM

## 2015-02-03 DIAGNOSIS — N400 Enlarged prostate without lower urinary tract symptoms: Secondary | ICD-10-CM | POA: Insufficient documentation

## 2015-02-03 DIAGNOSIS — I10 Essential (primary) hypertension: Secondary | ICD-10-CM | POA: Insufficient documentation

## 2015-02-03 DIAGNOSIS — E785 Hyperlipidemia, unspecified: Secondary | ICD-10-CM | POA: Insufficient documentation

## 2015-02-03 DIAGNOSIS — Z8546 Personal history of malignant neoplasm of prostate: Secondary | ICD-10-CM | POA: Diagnosis not present

## 2015-02-03 DIAGNOSIS — G4733 Obstructive sleep apnea (adult) (pediatric): Secondary | ICD-10-CM | POA: Diagnosis not present

## 2015-02-03 DIAGNOSIS — K5900 Constipation, unspecified: Secondary | ICD-10-CM | POA: Insufficient documentation

## 2015-02-03 DIAGNOSIS — R011 Cardiac murmur, unspecified: Secondary | ICD-10-CM | POA: Diagnosis not present

## 2015-02-03 NOTE — Progress Notes (Signed)
Subjective:    Patient ID: [name], male    DOB: 12-03-1935, 79 y.o.   MRN: [id number]  HPI  [name] is here in follow up of his thoracic ependymoma. He is progressing nicely. He has had a couple near-misses where he almost fell. They took place when he was moving too quickly and trying to carry too much in his hands. He is using a straight cane for balance.  He has no pain. His bowels are a little "slow" but functional. His bladder function is back to baseline.   His wife remains with him and is supportive and typically a voice of reason.   He wants to return to driving.   Emotionally, he denies any anxiety or depression. He is sleeping well. His fasting blood sugars in the am are around 150    Pain Inventory Average Pain 0 Pain Right Now 0 My pain is NA  In the last 24 hours, has pain interfered with the following? General activity 0 Relation with others 0 Enjoyment of life 0 What TIME of day is your pain at its worst? NA Sleep (in general) NA  Pain is worse with: NA Pain improves with: NA Relief from Meds: NA  Mobility walk with assistance use a cane ability to climb steps?  yes do you drive?  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  no  Physicians involved in your care Any changes since last visit?  no   Family History  Problem Relation Age of Onset  . Hypertension Mother   . Stroke Mother   . Hypertension Father   . Prostate cancer Father   . Hypertension Brother   . Prostate cancer Brother    Social History   Social History  . Marital Status: Married    Spouse Name: N/A  . Number of Children: N/A  . Years of Education: N/A   Social History Main Topics  . Smoking status: Never Smoker   . Smokeless tobacco: Never Used     Comment: occ alcohol  . Alcohol Use: Yes     Comment: OCCASIONAL  . Drug Use: No  .  Sexual Activity: Not Asked   Other Topics Concern  . None   Social History Narrative   He is married, father of 2, grandfather of 64. Does not really get exercise now because   of his back issues. Does not smoke and drinks social alcohol. Works in [employer].   Past Surgical History  Procedure Laterality Date  . Prostate palladium gold seed implants (118)  11-15-2002    PROSTATE CANCER  . Excision melanoma from forehead  2010  . Coronary angioplasty with stent placement  09-01-2004      Cypher DES x 2 d-m LCx 2.5 mm x 20 mm & 2.5 mm x 13 mm (~2.75 mm)  . Transthoracic echocardiogram  MAR [id number]    NORMAL EF > 55% / NORMAL FUNCTION WITH IMPAIRED RELAXATION AND MODERATE CONCENTRIC LVH LIKELY HYPERTENSIVE DISEASE  . Hydrocele excision  10/14/2011    Procedure: HYDROCELECTOMY ADULT;  Surgeon: Ailene Rud, MD;  [facility];  Service: Urology;  Laterality: Left;  . Circumsision    . Laminectomy N/A 07/13/2012    Procedure: THORACIC LAMINECTOMY FOR RESECTION OF INTRAMEDULLARY SPINAL CHORD TUMOR WITH SPINAL CHORD MONITORING;  Surgeon: Erline Levine, MD;  Location: [place] [facility];  Service: Neurosurgery;  Laterality: N/A;  Thoracic laminectomy for resection of intramedullary spinal cord tumor with spinal cord monitering and Dr. Christella Noa to assist  . Nm myoview ltd  3/212014    Lexiscan: LOW RISK, mild inferior bowel artifact; No ischemia or Infarction  . Eye surgery Bilateral     cataracts  . Tonsillectomy    . Laminectomy N/A 12/09/2014    Procedure: Redo Thoracic laminectomy with intramedullary tumar resection/USN/Cusa/Subarachnoid shunt/spinal cord monitoring/Dr. Kathyrn Sheriff to assist;  Surgeon: Erline Levine, MD;  Location: [place] [facility];  Service: Neurosurgery;  Laterality: N/A;  Redo Thoracic laminectomy with intramedullary tumar resection/USN/Cusa/Subarachnoid shunt/spinal cord monitoring/Dr. Kathyrn Sheriff to assist   Past Medical History  Diagnosis Date  .  History of prostate cancer 2004--  S/P SEED IMPLANTS     NO RECURRENCE  . S/P primary angioplasty with coronary stent 2006--      X2 DE STENTS - Cypher 2.5 mm postdilated to 2.75 mm (20 mm and 13 mm stents)  . Urgency of urination   . Frequency of urination   . H/O: gout STABLE  . Impaired hearing RIGHT HEARING AID  . Hydrocele, left   . History of melanoma excision 2010    FOREHEAD  . CAD S/P percutaneous coronary angioplasty CARDIOLOGIST-  DR DAVID [facility]    Myoview March 2014: No ischemia or infarction  . Diastolic dysfunction without heart failure     Moderate LVH, Gr 1 DD on Echo 2011; no CHF admissions, no tt on diuretisc  . Hyperlipidemia LDL goal <70   . Essential hypertension   . Colon polyps 05/10/2005    Hyperplastic polyps  . Depression   . OSA on CPAP     bipap  . Heart murmur     hx - no notable valvular lesion on Echo  . Cancer [facility])     prostate , melanoma head  . Arthritis   . Shingles 07  . Gout   . Complication of anesthesia     "takes long time to wake up"  . Diabetes mellitus type 2 with complications (HCC)     neuropathy; CAD borderline- no med   There were no vitals taken for this visit.  Opioid Risk Score:   Fall Risk Score:  `1  Depression screen PHQ 2/9  Depression screen [facility] 2/9 02/03/2015 08/18/2013  Decreased Interest 0 0  Down, Depressed, Hopeless 1 0  PHQ - 2 Score 1 0     Review of Systems  Endocrine:       High Blood Sugar  Neurological: Positive for weakness and numbness.       Gait Instability  All other systems reviewed and are negative.      Objective:   Physical Exam  Constitutional: He is oriented to person, place, and time. He appears well-developed.  HENT: oral mucosa pink and moist Head: Normocephalic.  Eyes: EOM are normal.  Neck: Normal range of motion. Neck supple. No thyromegaly present.  Cardiovascular: Normal rate and regular rhythm. rare pac if any today Respiratory: Effort normal and breath sounds  normal. No respiratory distress. No wheezes or rales GI: Soft. Bowel sounds are normal. He exhibits no distension.  Neurological: He is alert and oriented to person, place, and time.  Continued diminished LT/PP in bilateral lower extremities from upper thigh and distally. Decreased gross coordination of both legs, right more than left but improved.. RLE, : 4/5 hf, 4ke and 4- adf/apf. LLE: 4/5 hf, 4ke and 4-adf/apf. Bilateral UE: 5/5 deltoid, bicep, tricep,  wrist, HI. Cognitively he's appropriate with normal insight and awareness.he remains a bit impulsive at times.  Psychiatric: He has a normal mood and affect. His behavior is normal.. Generally playful at times.   Assessment/Plan:        1. 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 -improved mobility---needs to SLOW DOWN--reviewed his impulsivity once again -driving instructions were provided today in detail. Pt/wife understand 2 Pain Management: off all pain meds.  4. Mood: Lexapro 10 mg daily to continue 5  Hypertension. Per primary 9. Constipation/neurogenic bowel: bowels sluggish but he's generally continent. Encourage fluids 10. Hyperlipidemia. Crestor 11. History of gout. Allopurinol 300 mg daily. no flareups 12. BPH with history of prostate cancer/hematuria -emptying fairly frequently near baseline 13. CAD-per cadiology 14. DM--ongoing glucose monitoring and better diet control is needed.   Follow up with me in about 2 months. Thirty minutes of face to face patient care time were spent during this visit. All questions were encouraged and answered.

## 2015-02-03 NOTE — Patient Instructions (Signed)
CALL ME A WEEK BEFORE HOME HEALTH THERAPY ENDS AND I''LL SCHEDULE OUTPATIENT THERAPY   RETURN TO DRIVING PLAN:  WITH THE SUPERVISION OF A LICENSED DRIVER, PLEASE DRIVE IN AN EMPTY PARKING LOT FOR AT LEAST 2-3 TRIALS TO TEST REACTION TIME, VISION, USE OF EQUIPMENT IN CAR, ETC.  IF SUCCESSFUL WITH THE PARKING LOT DRIVING, PROCEED TO SUPERVISED DRIVING TRIALS IN YOUR NEIGHBORHOOD STREETS AT LOW TRAFFIC TIMES TO TEST OBSERVATION TO TRAFFIC SIGNALS, REACTION TIME, ETC. PLEASE ATTEMPT AT LEAST 2-3 TRIALS IN YOUR NEIGHBORHOOD.  IF NEIGHBORHOOD DRIVING IS SUCCESSFUL, YOU MAY PROCEED TO DRIVING IN BUSIER AREAS IN YOUR COMMUNITY WITH SUPERVISION OF A LICENSED DRIVER. PLEASE ATTEMPT AT LEAST 4-5 TRIALS.  IF COMMUNITY DRIVING IS SUCCESSFUL, YOU MAY PROCEED TO DRIVING ALONE, DURING THE DAY TIME, IN NON-PEAK TRAFFIC TIMES. YOU SHOULD DRIVE NO FURTHER THAN 30 MINUTES IN ONE DIRECTION. PLEASE DO NOT DRIVE IF YOU FEEL FATIGUED OR UNDER THE INFLUENCE OF MEDICATION.     TAKE YOUR TIME!!!!!!!!!!!!!!!!!!!!!!!!!!!!!!!!!!!!!!!!!!!!!!!!!!!!!!!!!!!!!!!!!!!!!!!!!!!!!!!!!!!!!!!!!!!!!!!!!!!!!!!!!!!!!!!!

## 2015-02-04 DIAGNOSIS — E1165 Type 2 diabetes mellitus with hyperglycemia: Secondary | ICD-10-CM | POA: Diagnosis not present

## 2015-02-04 DIAGNOSIS — C720 Malignant neoplasm of spinal cord: Secondary | ICD-10-CM | POA: Diagnosis not present

## 2015-02-04 DIAGNOSIS — E1140 Type 2 diabetes mellitus with diabetic neuropathy, unspecified: Secondary | ICD-10-CM | POA: Diagnosis not present

## 2015-02-04 DIAGNOSIS — I2510 Atherosclerotic heart disease of native coronary artery without angina pectoris: Secondary | ICD-10-CM | POA: Diagnosis not present

## 2015-02-04 DIAGNOSIS — G8222 Paraplegia, incomplete: Secondary | ICD-10-CM | POA: Diagnosis not present

## 2015-02-04 DIAGNOSIS — Z483 Aftercare following surgery for neoplasm: Secondary | ICD-10-CM | POA: Diagnosis not present

## 2015-02-06 DIAGNOSIS — E1165 Type 2 diabetes mellitus with hyperglycemia: Secondary | ICD-10-CM | POA: Diagnosis not present

## 2015-02-06 DIAGNOSIS — Z483 Aftercare following surgery for neoplasm: Secondary | ICD-10-CM | POA: Diagnosis not present

## 2015-02-06 DIAGNOSIS — E1140 Type 2 diabetes mellitus with diabetic neuropathy, unspecified: Secondary | ICD-10-CM | POA: Diagnosis not present

## 2015-02-06 DIAGNOSIS — G8222 Paraplegia, incomplete: Secondary | ICD-10-CM | POA: Diagnosis not present

## 2015-02-06 DIAGNOSIS — C720 Malignant neoplasm of spinal cord: Secondary | ICD-10-CM | POA: Diagnosis not present

## 2015-02-06 DIAGNOSIS — I2510 Atherosclerotic heart disease of native coronary artery without angina pectoris: Secondary | ICD-10-CM | POA: Diagnosis not present

## 2015-02-09 DIAGNOSIS — G8222 Paraplegia, incomplete: Secondary | ICD-10-CM | POA: Diagnosis not present

## 2015-02-09 DIAGNOSIS — C720 Malignant neoplasm of spinal cord: Secondary | ICD-10-CM | POA: Diagnosis not present

## 2015-02-09 DIAGNOSIS — I2510 Atherosclerotic heart disease of native coronary artery without angina pectoris: Secondary | ICD-10-CM | POA: Diagnosis not present

## 2015-02-09 DIAGNOSIS — E1140 Type 2 diabetes mellitus with diabetic neuropathy, unspecified: Secondary | ICD-10-CM | POA: Diagnosis not present

## 2015-02-09 DIAGNOSIS — Z483 Aftercare following surgery for neoplasm: Secondary | ICD-10-CM | POA: Diagnosis not present

## 2015-02-09 DIAGNOSIS — E1165 Type 2 diabetes mellitus with hyperglycemia: Secondary | ICD-10-CM | POA: Diagnosis not present

## 2015-02-11 DIAGNOSIS — I2510 Atherosclerotic heart disease of native coronary artery without angina pectoris: Secondary | ICD-10-CM | POA: Diagnosis not present

## 2015-02-11 DIAGNOSIS — D497 Neoplasm of unspecified behavior of endocrine glands and other parts of nervous system: Secondary | ICD-10-CM | POA: Diagnosis not present

## 2015-02-11 DIAGNOSIS — E1165 Type 2 diabetes mellitus with hyperglycemia: Secondary | ICD-10-CM | POA: Diagnosis not present

## 2015-02-11 DIAGNOSIS — E1140 Type 2 diabetes mellitus with diabetic neuropathy, unspecified: Secondary | ICD-10-CM | POA: Diagnosis not present

## 2015-02-11 DIAGNOSIS — C720 Malignant neoplasm of spinal cord: Secondary | ICD-10-CM | POA: Diagnosis not present

## 2015-02-11 DIAGNOSIS — Z483 Aftercare following surgery for neoplasm: Secondary | ICD-10-CM | POA: Diagnosis not present

## 2015-02-11 DIAGNOSIS — G8222 Paraplegia, incomplete: Secondary | ICD-10-CM | POA: Diagnosis not present

## 2015-02-11 DIAGNOSIS — M4714 Other spondylosis with myelopathy, thoracic region: Secondary | ICD-10-CM | POA: Diagnosis not present

## 2015-02-19 ENCOUNTER — Ambulatory Visit: Payer: Medicare Other | Attending: Neurosurgery | Admitting: Physical Therapy

## 2015-02-19 ENCOUNTER — Ambulatory Visit: Payer: Medicare Other | Admitting: Physical Therapy

## 2015-02-19 ENCOUNTER — Encounter: Payer: Self-pay | Admitting: Physical Therapy

## 2015-02-19 DIAGNOSIS — R269 Unspecified abnormalities of gait and mobility: Secondary | ICD-10-CM | POA: Diagnosis not present

## 2015-02-19 DIAGNOSIS — R42 Dizziness and giddiness: Secondary | ICD-10-CM | POA: Diagnosis not present

## 2015-02-19 DIAGNOSIS — G8220 Paraplegia, unspecified: Secondary | ICD-10-CM | POA: Diagnosis not present

## 2015-02-19 DIAGNOSIS — R2681 Unsteadiness on feet: Secondary | ICD-10-CM | POA: Diagnosis not present

## 2015-02-19 DIAGNOSIS — R201 Hypoesthesia of skin: Secondary | ICD-10-CM | POA: Diagnosis not present

## 2015-02-19 NOTE — Therapy (Signed)
Echo [address] [place], [place], [address] Phone: [phone]   Fax:  [phone]  Physical Therapy Evaluation  Patient Details  Name: [name] MRN: [id number] Date of Birth: 31-Jul-1935 Referring Provider: Alger Simons, MD  Encounter Date: 02/19/2015      PT End of Session - 02/19/15 1924    Visit Number 1   Number of Visits 17  eval + 16 visits   Date for PT Re-Evaluation 04/20/15   Authorization Type Medicare - G Codes and PN every 10 visits   PT Start Time 1016   PT Stop Time 1101   PT Time Calculation (min) 45 min   Activity Tolerance Patient tolerated treatment well   Behavior During Therapy Impulsive      Past Medical History  Diagnosis Date  . History of prostate cancer 2004--  S/P SEED IMPLANTS     NO RECURRENCE  . S/P primary angioplasty with coronary stent 2006--      X2 DE STENTS - Cypher 2.5 mm postdilated to 2.75 mm (20 mm and 13 mm stents)  . Urgency of urination   . Frequency of urination   . H/O: gout STABLE  . Impaired hearing RIGHT HEARING AID  . Hydrocele, left   . History of melanoma excision 2010    FOREHEAD  . CAD S/P percutaneous coronary angioplasty CARDIOLOGIST-  DR DAVID [facility]    Myoview March 2014: No ischemia or infarction  . Diastolic dysfunction without heart failure     Moderate LVH, Gr 1 DD on Echo 2011; no CHF admissions, no tt on diuretisc  . Hyperlipidemia LDL goal <70   . Essential hypertension   . Colon polyps 05/10/2005    Hyperplastic polyps  . Depression   . OSA on CPAP     bipap  . Heart murmur     hx - no notable valvular lesion on Echo  . Cancer [facility])     prostate , melanoma head  . Arthritis   . Shingles 07  . Gout   . Complication of anesthesia     "takes long time to wake up"  . Diabetes mellitus type 2 with complications (HCC)     neuropathy; CAD borderline- no med    Past Surgical History  Procedure Laterality Date  .  Prostate palladium gold seed implants (118)  11-15-2002    PROSTATE CANCER  . Excision melanoma from forehead  2010  . Coronary angioplasty with stent placement  09-01-2004      Cypher DES x 2 d-m LCx 2.5 mm x 20 mm & 2.5 mm x 13 mm (~2.75 mm)  . Transthoracic echocardiogram  MAR [id number]    NORMAL EF > 55% / NORMAL FUNCTION WITH IMPAIRED RELAXATION AND MODERATE CONCENTRIC LVH LIKELY HYPERTENSIVE DISEASE  . Hydrocele excision  10/14/2011    Procedure: HYDROCELECTOMY ADULT;  Surgeon: Ailene Rud, MD;  Location: [facility];  Service: Urology;  Laterality: Left;  . Circumsision    . Laminectomy N/A 07/13/2012    Procedure: THORACIC LAMINECTOMY FOR RESECTION OF INTRAMEDULLARY SPINAL CHORD TUMOR WITH SPINAL CHORD MONITORING;  Surgeon: Erline Levine, MD;  Location: [place] [facility];  Service: Neurosurgery;  Laterality: N/A;  Thoracic laminectomy for resection of intramedullary spinal cord tumor with spinal cord monitering and Dr. Christella Noa to assist  . Nm myoview ltd  3/212014    Lexiscan: LOW RISK, mild inferior bowel artifact; No ischemia or Infarction  . Eye surgery Bilateral  cataracts  . Tonsillectomy    . Laminectomy N/A 12/09/2014    Procedure: Redo Thoracic laminectomy with intramedullary tumar resection/USN/Cusa/Subarachnoid shunt/spinal cord monitoring/Dr. Kathyrn Sheriff to assist;  Surgeon: Erline Levine, MD;  Location: [place] [facility];  Service: Neurosurgery;  Laterality: N/A;  Redo Thoracic laminectomy with intramedullary tumar resection/USN/Cusa/Subarachnoid shunt/spinal cord monitoring/Dr. Kathyrn Sheriff to assist    There were no vitals filed for this visit.  Visit Diagnosis:  Abnormality of gait  Impaired sensation  Unsteadiness  Paraplegia (HCC)  Dizziness and giddiness      Subjective Assessment - 02/19/15 1026    Subjective Since being discharged from hospital, pt reports difficulty maintaining balance with eyes closed whle washing face; describes numbness distance  to B knees; and "spinning" sensation. 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 No/denies            [facility] PT Assessment - 02/19/15 0001    Assessment   Medical Diagnosis Thoracic ependymoma status post resection with paraplegia   Referring Provider Alger Simons, MD   Onset Date/Surgical Date 12/09/14   Precautions   Precautions Fall   Restrictions   Weight Bearing Restrictions No   Balance Screen   Has the patient fallen in the past 6 months Yes   How many times? 2   Has the patient had a decrease in activity level because of a fear of falling?  Yes   Is the patient reluctant to leave their home because of a fear of falling?  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   ROM / Strength   AROM / PROM / Strength Strength   Strength   Overall Strength Deficits   Strength Assessment Site Hip;Knee;Ankle   Right/Left Hip Left;Right   Right Hip Flexion 4-/5   Left Hip Flexion 4/5   Left Hip Extension 4/5   Right/Left Knee Right;Left   Right Knee Flexion 4-/5   Right Knee Extension 4/5   Right/Left Ankle Right;Left   Right Ankle Dorsiflexion 4/5    Right Ankle Plantar Flexion 5/5   Left Ankle Dorsiflexion 4-/5   Left Ankle Plantar Flexion 5/5   Transfers   Transfers Sit to Stand;Stand to Sit   Sit to Stand 6: Modified independent (Device/Increase time)   Stand to Sit 6: Modified independent (Device/Increase time)   Ambulation/Gait   Ambulation/Gait Yes   Ambulation/Gait Assistance 5: Supervision   Ambulation/Gait Assistance Details Pt ambulates very fast with incorrect/inconsistent use of SPC.   Ambulation Distance (Feet) 200 Feet   Assistive device Straight cane   Gait Pattern Step-through pattern;Decreased stride length;Decreased hip/knee flexion - right;Decreased hip/knee flexion -  left;Right foot flat;Left foot flat;Right flexed knee in stance;Left flexed knee in stance   Ambulation Surface Level;Indoor   Standardized Balance Assessment   Standardized Balance Assessment Berg Balance Test   Berg Balance Test   Sit to Stand Able to stand  independently using hands   Standing Unsupported Able to stand 2 minutes with supervision   Sitting with Back Unsupported but Feet Supported on Floor or Stool Able to sit safely and securely 2 minutes   Stand to Sit Sits independently, has uncontrolled descent   Transfers Able to transfer with verbal cueing and /or supervision   Standing Unsupported with Eyes Closed Able to stand 3 seconds  anterior LOB after 5.5 seconds   Standing Ubsupported with Feet Together Needs help to attain position but able to stand for 30 seconds with feet together   From Standing, Reach Forward with Outstretched Arm Can reach forward >12 cm safely (5")   From Standing Position, Pick up Object from Floor Unable to pick up shoe, but reaches 2-5 cm (1-2") from shoe and balances independently   From Standing Position, Turn to Look Behind Over each Shoulder Turn sideways only but maintains balance   Turn 360 Degrees Needs close supervision or verbal cueing   Standing Unsupported, Alternately Place Feet on Step/Stool Able  to complete >2 steps/needs minimal assist   Standing Unsupported, One Foot in Front Able to take small step independently and hold 30 seconds   Standing on One Leg Tries to lift leg/unable to hold 3 seconds but remains standing independently   Total Score 28            Vestibular Assessment - 02/19/15 0001    Symptom Behavior   Type of Dizziness Spinning   Frequency of Dizziness this morning   Duration of Dizziness minutes   Aggravating Factors Mornings;Supine to sit   Relieving Factors No known relieving factors   Positional Testing   Sidelying Test Sidelying Right;Sidelying Left   Horizontal Canal Testing Horizontal Canal Right;Horizontal Canal Left   Sidelying Right   Sidelying Right Duration NA   Sidelying Right Symptoms No nystagmus   Sidelying Left   Sidelying Left Duration NA   Sidelying Left Symptoms No nystagmus   Horizontal Canal Right   Horizontal Canal Right Duration NA   Horizontal Canal Right Symptoms Normal   Horizontal Canal Left   Horizontal Canal Left Duration NA   Horizontal Canal Left Symptoms Normal                       PT Education - 02/19/15 1923    Education provided Yes   Education Details PT eval findings, goals, and POC.   Person(s) Educated Patient   Methods Explanation   Comprehension Verbalized understanding          PT Short Term Goals - 02/19/15 1932    PT SHORT TERM GOAL #1   Title Pt will perform intiial HEP with mod I using paper handout to maximize functional gains made in PT. Target date: 03/19/15   PT SHORT TERM GOAL #2   Title Pt will improve Berg score from 28 to 31/56 to indicate improved functional standing balance. Target date: 03/19/15   PT SHORT TERM GOAL #3   Title Pt will negotiate 10 stairs with 2 rails and mod I to indicate pt safety accessing second floor of home. Target date: 03/19/15   PT SHORT TERM GOAL #4   Title Pt will ambulate 200' over level,  indoor surfaces with mod I and safe gait speed  and appropriate use of AD. Target date: 03/19/15   PT SHORT TERM GOAL #5   Title Further assess origin of dizziness, lightheadedness to prevent future falls. Target date: 03/19/15.           PT Long Term Goals - 02/27/2015 1937    PT LONG TERM GOAL #1   Title Pt will verbalize understanding of fall prevention strategies in home environment to decrease risk of falls within home. Target date: 04/16/15   PT LONG TERM GOAL #2   Title Pt will improve Berg score from 28 to 34/56 to indicate significant improvement in functional standing balance. Target date: 04/16/15   PT LONG TERM GOAL #3   Title Pt will ambulte 1,000' over unlevel, paved surfaces with mod I using LRAD with good safety awareness and no overt LOB to indicate improved safety/independence with community mobility. Target date: 04/16/15   PT LONG TERM GOAL #4   Title Pt will negotiate standard ramp and curb step with mod I using LRAD to indicate pt ability to traverse community obstacles. Target date: 04/16/15   PT LONG TERM GOAL #5   Title Pt will verbalize ability to lead while dancing without loss of balance to enable pt to engage in leisure activities, improve QOL. Target date: 04/16/15               Plan - 2015/02/27 1926    Clinical Impression Statement Pt is a 79 y/o M referred to outpatient PT to address functional impairments associated with thoracic ependymoma s/p resection 12/09/2014 with resulting paraplegia. PT evaluation reveals the followng: gait impairments; impaired sensation/proprioception in BLE's; Berg score indicative of high fall risk; BLE weakness (RLE > LLE); impaired RLE coordination; poor safety awareness; impaired functional standing balance; and dizziness (ruled out positional vertigo on PT evaluation). Pt will benefit from skilled outpatient PT 2x/week for 8 weeks to address said impairments.   Pt will benefit from skilled therapeutic intervention in order to improve on the following deficits Abnormal  gait;Decreased knowledge of use of DME;Decreased balance;Decreased safety awareness;Decreased coordination;Decreased mobility;Decreased strength;Dizziness;Impaired perceived functional ability;Impaired sensation   Rehab Potential Good   Clinical Impairments Affecting Rehab Potential decreased safety awareness   PT Frequency 2x / week   PT Duration 8 weeks   PT Treatment/Interventions ADLs/Self Care Home Management;Gait training;Stair training;Functional mobility training;Therapeutic activities;Therapeutic exercise;Balance training;Orthotic Fit/Training;Patient/family education;Manual techniques;Neuromuscular re-education;Vestibular;Canalith Repostioning;DME Instruction   PT Next Visit Plan assess orthostatic vitals; initiate HEP   PT Home Exercise Plan Consider corner balance (EO on pillow; EC on solid surface)   Consulted and Agree with Plan of Care Patient          G-Codes - 02-27-2015 1918    Functional Assessment Tool Used Merrilee Jansky 28/56   Functional Limitation Mobility: Walking and moving around   Mobility: Walking and Moving Around Current Status [phone]) At least 40 percent but less than 60 percent impaired, limited or restricted   Mobility: Walking and Moving Around Goal Status [phone]) At least 20 percent but less than 40 percent impaired, limited or restricted       Problem List Patient Active Problem List   Diagnosis Date Noted  . Diabetes type 2, uncontrolled ([place])   . Thoracic spine tumor 12/12/2014  . Neurogenic bowel 12/12/2014  . Incomplete paraplegia ([place]) 12/12/2014  . Back pain 12/09/2014  . Malignant tumor spinal cord ([place]) 12/09/2014  . Pre-operative cardiovascular examination 09/17/2014  . Other fatigue 09/17/2014  .  Chest pain with moderate risk for cardiac etiology 09/17/2014  . Ependymoma of spinal cord ([place]) 11/01/2013  . Unspecified hereditary and idiopathic peripheral neuropathy 11/01/2013  . Diastolic dysfunction without heart failure   . Obesity (BMI  30-39.9) 03/22/2013  . OSA on CPAP 03/19/2012  . H/O prostate cancer 03/19/2012  . Essential hypertension 03/19/2012  . Hyperlipidemia LDL goal <70 03/19/2012  . H/O hydrocele 03/19/2012  . Hx of melanoma excision 03/19/2012  . Personal history of colonic polyps 03/19/2012  . CAD S/P percutaneous coronary angioplasty: Cypher DES x2 to circumflex 08/17/2004    Billie Ruddy, PT, DPT [facility] [address] [place], [place], [address] Phone: [phone]   Fax:  [phone] 02/19/2015, 7:52 PM   Name: [name] MRN: [id number] Date of Birth: [birth year]

## 2015-02-24 ENCOUNTER — Other Ambulatory Visit: Payer: Self-pay | Admitting: Physical Medicine & Rehabilitation

## 2015-02-25 ENCOUNTER — Ambulatory Visit: Payer: Medicare Other | Admitting: Physical Therapy

## 2015-02-25 DIAGNOSIS — G8220 Paraplegia, unspecified: Secondary | ICD-10-CM

## 2015-02-25 DIAGNOSIS — R269 Unspecified abnormalities of gait and mobility: Secondary | ICD-10-CM | POA: Diagnosis not present

## 2015-02-25 DIAGNOSIS — R201 Hypoesthesia of skin: Secondary | ICD-10-CM

## 2015-02-25 DIAGNOSIS — R2681 Unsteadiness on feet: Secondary | ICD-10-CM

## 2015-02-25 DIAGNOSIS — R42 Dizziness and giddiness: Secondary | ICD-10-CM | POA: Diagnosis not present

## 2015-02-25 NOTE — Therapy (Signed)
[facility] [address] [place], [place], [address] Phone: [phone]   Fax:  [phone]  Physical Therapy Treatment  Patient Details  Name: [name] MRN: [id number] Date of Birth: 08/31/1935 Referring Provider: Alger Simons, MD  Encounter Date: 02/25/2015      PT End of Session - 02/25/15 1431    Visit Number 2   Number of Visits 17   Date for PT Re-Evaluation 04/20/15   Authorization Type Medicare - G Codes and PN every 10 visits   PT Start Time 1318   PT Stop Time 1406   PT Time Calculation (min) 48 min   Equipment Utilized During Treatment Gait belt   Activity Tolerance Patient tolerated treatment well   Behavior During Therapy [facility] for tasks assessed/performed      Past Medical History  Diagnosis Date  . History of prostate cancer 2004--  S/P SEED IMPLANTS     NO RECURRENCE  . S/P primary angioplasty with coronary stent 2006--      X2 DE STENTS - Cypher 2.5 mm postdilated to 2.75 mm (20 mm and 13 mm stents)  . Urgency of urination   . Frequency of urination   . H/O: gout STABLE  . Impaired hearing RIGHT HEARING AID  . Hydrocele, left   . History of melanoma excision 2010    FOREHEAD  . CAD S/P percutaneous coronary angioplasty CARDIOLOGIST-  [facility]    Myoview March 2014: No ischemia or infarction  . Diastolic dysfunction without heart failure     Moderate LVH, Gr 1 DD on Echo 2011; no CHF admissions, no tt on diuretisc  . Hyperlipidemia LDL goal <70   . Essential hypertension   . Colon polyps 05/10/2005    Hyperplastic polyps  . Depression   . OSA on CPAP     bipap  . Heart murmur     hx - no notable valvular lesion on Echo  . Cancer [facility])     prostate , melanoma head  . Arthritis   . Shingles 07  . Gout   . Complication of anesthesia     "takes long time to wake up"  . Diabetes mellitus type 2 with complications (HCC)     neuropathy; CAD borderline- no med     Past Surgical History  Procedure Laterality Date  . Prostate palladium gold seed implants (118)  11-15-2002    PROSTATE CANCER  . Excision melanoma from forehead  2010  . Coronary angioplasty with stent placement  09-01-2004      Cypher DES x 2 d-m LCx 2.5 mm x 20 mm & 2.5 mm x 13 mm (~2.75 mm)  . Transthoracic echocardiogram  MAR [id number]    NORMAL EF > 55% / NORMAL FUNCTION WITH IMPAIRED RELAXATION AND MODERATE CONCENTRIC LVH LIKELY HYPERTENSIVE DISEASE  . Hydrocele excision  10/14/2011    Procedure: HYDROCELECTOMY ADULT;  Surgeon: Ailene Rud, MD;  Location: [facility];  Service: Urology;  Laterality: Left;  . Circumsision    . Laminectomy N/A 07/13/2012    Procedure: THORACIC LAMINECTOMY FOR RESECTION OF INTRAMEDULLARY SPINAL CHORD TUMOR WITH SPINAL CHORD MONITORING;  Surgeon: Erline Levine, MD;  Location: [place] [facility];  Service: Neurosurgery;  Laterality: N/A;  Thoracic laminectomy for resection of intramedullary spinal cord tumor with spinal cord monitering and Dr. Christella Noa to assist  . Nm myoview ltd  [id number]    Lexiscan: LOW RISK, mild inferior bowel artifact; No ischemia or Infarction  .  Eye surgery Bilateral     cataracts  . Tonsillectomy    . Laminectomy N/A 12/09/2014    Procedure: Redo Thoracic laminectomy with intramedullary tumar resection/USN/[place]/Subarachnoid shunt/spinal cord monitoring/Dr. Kathyrn Sheriff to assist;  Surgeon: Erline Levine, MD;  Location: [place] [facility];  Service: Neurosurgery;  Laterality: N/A;  Redo Thoracic laminectomy with intramedullary tumar resection/USN/[place]/Subarachnoid shunt/spinal cord monitoring/Dr. Kathyrn Sheriff to assist    There were no vitals filed for this visit.  Visit Diagnosis:  Abnormality of gait  Unsteadiness  Impaired sensation  Paraplegia (HCC)  Dizziness and giddiness      Subjective Assessment - 02/25/15 1418    Subjective Pt denies falls, reports no significant changes since last session. Reports that  lightheadedness/dizziness has improved but continues to feel ligthheaded occasionally upon sit > stand.   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 No/denies                Vestibular Assessment - 02/25/15 0001    Orthostatics   BP supine (x 5 minutes) 147/70 mmHg   HR supine (x 5 minutes) 58   BP standing (after 1 minute) 96/52 mmHg  pt reports "very little" lightheadedness   HR standing (after 1 minute) 91   BP standing (after 3 minutes) --  Automatic BP cuff error due to postural sway; asymtomatic                 [facility] Adult PT Treatment/Exercise - 02/25/15 0001    Ambulation/Gait   Ambulation/Gait Yes   Ambulation/Gait Assistance 5: Supervision;4: Min guard;4: Min assist   Ambulation/Gait Assistance Details Gait with SPC 2 x140' with cueing for slower gait velocity for increased safety. Gait x345 without AD with cueing for slow gait speed, B heel strike, reciprocal arm swing, upright posture due to forward trunk flexion, downward gaze, anterior momentum.    Ambulation Distance (Feet) 585 Feet   Assistive device Straight cane;None   Gait Pattern Step-through pattern;Decreased stride length;Decreased hip/knee flexion - right;Decreased hip/knee flexion - left;Right foot flat;Left foot flat;Right flexed knee in stance;Left flexed knee in stance;Wide base of support;Decreased trunk rotation;Decreased arm swing - right;Decreased arm swing - left   Ambulation Surface Level;Unlevel   Gait Comments When pt ambulating without AD, noted decreased B arm swing, BUE's in high guarded position, limited trunk rotation, and limited LLE step length. When this Pt manually facilitated reciprocal arm swing, pt  with very short B step length.   Self-Care   Self-Care Other Self-Care Comments   Other Self-Care Comments  Discussed strategies for preventing, managing orthostatic hypotension with focus on hydration, postural adjustment, compression garments, and performance of LE therex. Emphasized importance of static standing > 10 seconds prior to initiating ambulating to decrease fall risk. Pt verbalized understanding.   Neuro Re-ed    Neuro Re-ed Details  Tall kneeling x11 minutes to increase trunk/postural control, proximal stability during gait. In tall kneeling, pt performed blocked practice of forward/retro stepping with LLE with focus on trunk control, R hip stability during LLE advancement. Performed lateral stepping in B directions with focus on slow, controlled movement and avoiding excessive lateral trunk movement with inconsistent with improved within-session carryover. Performed alternating step taps with 1 lb weights on B ankles  for increased proprioceptive input, progressing from step taps with BUE support > single UE support > no UE support with visual input > no UE support without visual input. During final condition, noted difficulty with LE placement on R > LLE. Cueing focused on grading of lateral weight shifting, decreasing speed of movement to improve quality/stability  all activities ended due to pt c/o fatigue in B quadriceps   Exercises   Exercises Other Exercises   Other Exercises  Added squats x12 reps (to pt fatigue) to HEP to address frequent c/o fatigue in B quadriceps during NMR.             Balance Exercises - 02/25/15 1432    Balance Exercises: Standing   Standing Eyes Opened Wide (BOA);Head turns;Foam/compliant surface;10 secs  See Pt Instructions for details.   Standing Eyes Closed Wide (BOA);Foam/compliant surface  See Pt Instructions for details.           PT Education - 02/25/15 1418    Education provided Yes   Education Details HEP initiated; see Pt  Instructions. Education on orthostatic hypotension; see Self Care for details.   Person(s) Educated Patient   Methods Explanation;Demonstration;Handout;Verbal cues   Comprehension Verbalized understanding;Returned demonstration          PT Short Term Goals - 02/25/15 1439    PT SHORT TERM GOAL #1   Title Pt will perform intiial HEP with mod I using paper handout to maximize functional gains made in PT. Target date: 03/19/15   PT SHORT TERM GOAL #2   Title Pt will improve Berg score from 28 to 31/56 to indicate improved functional standing balance. Target date: 03/19/15   PT SHORT TERM GOAL #3   Title Pt will negotiate 10 stairs with 2 rails and mod I to indicate pt safety accessing second floor of home. Target date: 03/19/15   PT SHORT TERM GOAL #4   Title Pt will ambulate 200' over level, indoor surfaces with mod I and safe gait speed and appropriate use of AD. Target date: 03/19/15   PT SHORT TERM GOAL #5   Title Further assess origin of dizziness, lightheadedness to prevent future falls. Target date: 03/19/15.   Status Achieved           PT Long Term Goals - 02/19/15 1937    PT LONG TERM GOAL #1   Title Pt will verbalize understanding of fall prevention strategies in home environment to decrease risk of falls within home. Target date: 04/16/15   PT LONG TERM GOAL #2   Title Pt will improve Berg score from 28 to 34/56 to indicate significant improvement in functional standing balance. Target date: 04/16/15   PT LONG TERM GOAL #3   Title Pt will ambulte 1,000' over unlevel, paved surfaces with mod I using LRAD with good safety awareness and no overt LOB to indicate improved safety/independence with community mobility. Target date: 04/16/15   PT LONG TERM GOAL #4   Title Pt will negotiate standard ramp and curb step with mod I using LRAD to indicate pt ability to traverse community obstacles. Target date: 04/16/15   PT LONG TERM GOAL #5   Title Pt will verbalize ability to lead while  dancing without loss of balance to enable pt to engage in leisure activities, improve QOL. Target date: 04/16/15               Plan - 02/25/15 1434    Clinical Impression Statement Session focused on gait training, use of tall  kneeling to increase proprioceptive input in LE's to improve proximal stability/control. NMR interventions limited by pt c/o of feeling "tired" in B quadriceps. Provided education on orthostatic hypotension due to symptomatic decrease in BP from 147/70 in supine > 96/52 in seated.   Pt will benefit from skilled therapeutic intervention in order to improve on the following deficits Abnormal gait;Decreased knowledge of use of DME;Decreased balance;Decreased safety awareness;Decreased coordination;Decreased mobility;Decreased strength;Dizziness;Impaired perceived functional ability;Impaired sensation   Rehab Potential Good   Clinical Impairments Affecting Rehab Potential decreased safety awareness   PT Frequency 2x / week   PT Duration 8 weeks   PT Treatment/Interventions ADLs/Self Care Home Management;Gait training;Stair training;Functional mobility training;Therapeutic activities;Therapeutic exercise;Balance training;Orthotic Fit/Training;Patient/family education;Manual techniques;Neuromuscular re-education;Vestibular;Canalith Repostioning;DME Instruction   PT Next Visit Plan NMR to increase LE proprioceptive input, proximal stability (pt unsure if still has 5 lb lifting restrictions, so haven't tried quadruped yet). If time, check safety/performance with HEP and progress, as appropriate.   PT Home Exercise Plan See Pt Instructions from 11/23.   Consulted and Agree with Plan of Care Patient        Problem List Patient Active Problem List   Diagnosis Date Noted  . Diabetes type 2, uncontrolled ([place])   . Thoracic spine tumor 12/12/2014  . Neurogenic bowel 12/12/2014  . Incomplete paraplegia ([place]) 12/12/2014  . Back pain 12/09/2014  . Malignant tumor spinal cord  ([place]) 12/09/2014  . Pre-operative cardiovascular examination 09/17/2014  . Other fatigue 09/17/2014  . Chest pain with moderate risk for cardiac etiology 09/17/2014  . Ependymoma of spinal cord ([place]) 11/01/2013  . Unspecified hereditary and idiopathic peripheral neuropathy 11/01/2013  . Diastolic dysfunction without heart failure   . Obesity (BMI 30-39.9) 03/22/2013  . OSA on CPAP 03/19/2012  . H/O prostate cancer 03/19/2012  . Essential hypertension 03/19/2012  . Hyperlipidemia LDL goal <70 03/19/2012  . H/O hydrocele 03/19/2012  . Hx of melanoma excision 03/19/2012  . Personal history of colonic polyps 03/19/2012  . CAD S/P percutaneous coronary angioplasty: Cypher DES x2 to circumflex 08/17/2004   Billie Ruddy, PT, DPT [facility] [address] [place], [place], [address] Phone: [phone]   Fax:  [phone] 02/25/2015, 2:40 PM   Name: [name] MRN: [id number] Date of Birth: Jul 30, 1935

## 2015-02-25 NOTE — Patient Instructions (Addendum)
Balance: Eyes Closed while Standing on Pillow    Stand with your back to a corner with a stable chair in front of you. Stand on a pillow with eyes open; try not to hold on unless you need to.  Hold for 30 seconds; 4 sets. Do this twice per day, every day.     Feet Apart (Compliant Surface) Head Motion - Eyes Open    Stand with back to corner with stable chair in front of you. Stand on pillow with feet shoulder width apart, move head slowly: up and down 10 times; right to left 10 times. Perform this 3 times per day.    Deep Squat    Stand in front of a stable chair or bed with feet shoulder width apart. Squat as though you're about to sit on the bed/chair (but don't actually sit down). Make sure your knees don't go over your toes. Return to starting position. Perform 12 reps, 3 times per day.  Copyright  VHI. All rights reserved.

## 2015-03-03 ENCOUNTER — Ambulatory Visit: Payer: Medicare Other | Admitting: Physical Therapy

## 2015-03-03 DIAGNOSIS — R201 Hypoesthesia of skin: Secondary | ICD-10-CM

## 2015-03-03 DIAGNOSIS — R269 Unspecified abnormalities of gait and mobility: Secondary | ICD-10-CM | POA: Diagnosis not present

## 2015-03-03 DIAGNOSIS — G8220 Paraplegia, unspecified: Secondary | ICD-10-CM | POA: Diagnosis not present

## 2015-03-03 DIAGNOSIS — R42 Dizziness and giddiness: Secondary | ICD-10-CM | POA: Diagnosis not present

## 2015-03-03 DIAGNOSIS — R2681 Unsteadiness on feet: Secondary | ICD-10-CM | POA: Diagnosis not present

## 2015-03-03 NOTE — Patient Instructions (Signed)
Balance: Eyes Closed while Standing on Pillow    Stand with your back to a corner with a stable chair in front of you. Stand on a pillow with eyes open; try not to hold on unless you need to.  Hold for 30 seconds; 4 sets. Do this twice per day, every day.     Feet Apart (Compliant Surface) Head Motion - Eyes Open    Stand with back to corner with stable chair in front of you. Stand on pillow with feet shoulder width apart, move head slowly: up and down 10 times; right to left 10 times. Perform this 3 times per day.    Deep Squat    Stand in front of a stable chair or bed with feet shoulder width apart. Squat as though you're about to sit on the bed/chair (but don't actually sit down). Make sure your knees don't go over your toes. Return to starting position. Perform 12 reps, 3 times per day.

## 2015-03-03 NOTE — Therapy (Signed)
[place] [address] [place], [place], [address] Phone: [phone]   Fax:  [phone]  Physical Therapy Treatment  Patient Details  Name: [name] MRN: [id number] Date of Birth: 29-May-1935 Referring Provider: Alger Simons, MD  Encounter Date: 03/03/2015      PT End of Session - 03/03/15 1525    Visit Number 3   Number of Visits 17   Date for PT Re-Evaluation 04/20/15   Authorization Type Medicare - G Codes and PN every 10 visits   PT Start Time 1148   PT Stop Time 1230   PT Time Calculation (min) 42 min   Equipment Utilized During Treatment Gait belt   Activity Tolerance Patient tolerated treatment well   Behavior During Therapy [facility] for tasks assessed/performed      Past Medical History  Diagnosis Date  . History of prostate cancer 2004--  S/P SEED IMPLANTS     NO RECURRENCE  . S/P primary angioplasty with coronary stent 2006--      X2 DE STENTS - Cypher 2.5 mm postdilated to 2.75 mm (20 mm and 13 mm stents)  . Urgency of urination   . Frequency of urination   . H/O: gout STABLE  . Impaired hearing RIGHT HEARING AID  . Hydrocele, left   . History of melanoma excision 2010    FOREHEAD  . CAD S/P percutaneous coronary angioplasty CARDIOLOGIST-  DR DAVID [facility]    Myoview March 2014: No ischemia or infarction  . Diastolic dysfunction without heart failure     Moderate LVH, Gr 1 DD on Echo 2011; no CHF admissions, no tt on diuretisc  . Hyperlipidemia LDL goal <70   . Essential hypertension   . Colon polyps 05/10/2005    Hyperplastic polyps  . Depression   . OSA on CPAP     bipap  . Heart murmur     hx - no notable valvular lesion on Echo  . Cancer [facility])     prostate , melanoma head  . Arthritis   . Shingles 07  . Gout   . Complication of anesthesia     "takes long time to wake up"  . Diabetes mellitus type 2 with complications (HCC)     neuropathy; CAD borderline- no med     Past Surgical History  Procedure Laterality Date  . Prostate palladium gold seed implants (118)  11-15-2002    PROSTATE CANCER  . Excision melanoma from forehead  2010  . Coronary angioplasty with stent placement  09-01-2004      Cypher DES x 2 d-m LCx 2.5 mm x 20 mm & 2.5 mm x 13 mm (~2.75 mm)  . Transthoracic echocardiogram  MAR [id number]    NORMAL EF > 55% / NORMAL FUNCTION WITH IMPAIRED RELAXATION AND MODERATE CONCENTRIC LVH LIKELY HYPERTENSIVE DISEASE  . Hydrocele excision  10/14/2011    Procedure: HYDROCELECTOMY ADULT;  Surgeon: Ailene Rud, MD;  Location: [facility];  Service: Urology;  Laterality: Left;  . Circumsision    . Laminectomy N/A 07/13/2012    Procedure: THORACIC LAMINECTOMY FOR RESECTION OF INTRAMEDULLARY SPINAL CHORD TUMOR WITH SPINAL CHORD MONITORING;  Surgeon: Erline Levine, MD;  Location: [place] [facility];  Service: Neurosurgery;  Laterality: N/A;  Thoracic laminectomy for resection of intramedullary spinal cord tumor with spinal cord monitering and Dr. Christella Noa to assist  . Nm myoview ltd  3/212014    Lexiscan: LOW RISK, mild inferior bowel artifact; No ischemia or Infarction  .  Eye surgery Bilateral     cataracts  . Tonsillectomy    . Laminectomy N/A 12/09/2014    Procedure: Redo Thoracic laminectomy with intramedullary tumar resection/USN/Cusa/Subarachnoid shunt/spinal cord monitoring/Dr. Kathyrn Sheriff to assist;  Surgeon: Erline Levine, MD;  Location: [place] NEUR[facility] OR[facility];  Service: Neurosurgery;  Laterality: N/A;  Redo Thoracic laminectomy with intramedullary tumar resection/USN/[place]usa/Subarachnoid shunt/spinal cord monitoring/Dr. Kathyrn Sheriff to assist    There were no vitals filed for this visit.  Visit Diagnosis:  Abnormality of gait  Unsteadiness  Impaired sensation      Subjective Assessment - 03/03/15 1155    Subjective Pt reports no falls, no significant changes since last session. When asked about HEP compliance, pt replied, "No; not really. 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 No/denies                         [facility] Adult PT Treatment/Exercise - 03/03/15 0001    Ambulation/Gait   Ambulation/Gait Yes   Ambulation/Gait Assistance 5: Supervision;4: Min guard   Ambulation/Gait Assistance Details Gait without AD with blue Tband at RLE for increased proprioceptive input (at anterior ankle, posterior knee, and anterior hip). Noted improved RLE placement with use of Tband.   Ambulation Distance (Feet) 345 Feet   Assistive device None   Gait Pattern Step-through pattern;Decreased stride length;Decreased hip/knee flexion - right;Decreased hip/knee flexion - left;Right foot flat;Left foot flat;Right flexed knee in stance;Left flexed knee in stance;Decreased trunk rotation;Decreased arm swing - right;Decreased arm swing - left   Ambulation Surface Level;Indoor   Gait Comments With Tband at RLE as described above for proprioceptive input, pt negotiated hurdles of varying height with min guard to min A 12 x 4 trials; multimodal cueing focused on decreasing gait velocity to increase safety.   Neuro Re-ed    Neuro Re-ed Details  Standing with intermittent UE support at parallel bars, blue Tband anchored to knee of respective LE for increased proprioceptive input, pt performed LE tapping for targets of varying heights/sizes with min guard-min A and multimodal PT cueing for grading of movement, grading of weight shift (focus on controlling lateral weight shift to L side during L SLS). Performed for approx 16 minutes.    Exercises   Exercises Other Exercises   Other Exercises  With use of paper handout and with PT  providing 50% cueing for technique, pt performed home exercises provided during previous session. See Pt Instructions for details on exercises, reps, sets, frequency, and duration.                PT Education - 03/03/15 1514    Education provided Yes   Education Details Reiterated importance of HEP performance to maximize functional gains made in PT.   Person(s) Educated Patient   Methods Explanation   Comprehension Verbalized understanding          PT Short Term Goals - 02/25/15 1439    PT SHORT TERM GOAL #1   Title Pt will perform intiial HEP with mod I using paper handout to maximize functional gains made in PT. Target date: 03/19/15   PT SHORT TERM GOAL #2   Title Pt  will improve Berg score from 28 to 31/56 to indicate improved functional standing balance. Target date: 03/19/15   PT SHORT TERM GOAL #3   Title Pt will negotiate 10 stairs with 2 rails and mod I to indicate pt safety accessing second floor of home. Target date: 03/19/15   PT SHORT TERM GOAL #4   Title Pt will ambulate 200' over level, indoor surfaces with mod I and safe gait speed and appropriate use of AD. Target date: 03/19/15   PT SHORT TERM GOAL #5   Title Further assess origin of dizziness, lightheadedness to prevent future falls. Target date: 03/19/15.   Status Achieved           PT Long Term Goals - 02/19/15 1937    PT LONG TERM GOAL #1   Title Pt will verbalize understanding of fall prevention strategies in home environment to decrease risk of falls within home. Target date: 04/16/15   PT LONG TERM GOAL #2   Title Pt will improve Berg score from 28 to 34/56 to indicate significant improvement in functional standing balance. Target date: 04/16/15   PT LONG TERM GOAL #3   Title Pt will ambulte 1,000' over unlevel, paved surfaces with mod I using LRAD with good safety awareness and no overt LOB to indicate improved safety/independence with community mobility. Target date: 04/16/15   PT LONG TERM  GOAL #4   Title Pt will negotiate standard ramp and curb step with mod I using LRAD to indicate pt ability to traverse community obstacles. Target date: 04/16/15   PT LONG TERM GOAL #5   Title Pt will verbalize ability to lead while dancing without loss of balance to enable pt to engage in leisure activities, improve QOL. Target date: 04/16/15               Plan - 03/03/15 1526    Clinical Impression Statement Skilled session focused on gait/balance training with emphasis on increasing LE proprioceptive input, increasing grading of movement to improve stability/independence. PT tolerated interventions well but did requiire frequent cueing for safety awareness, safe speed of movement/mobility.   Pt will benefit from skilled therapeutic intervention in order to improve on the following deficits Abnormal gait;Decreased knowledge of use of DME;Decreased balance;Decreased safety awareness;Decreased coordination;Decreased mobility;Decreased strength;Dizziness;Impaired perceived functional ability;Impaired sensation   Rehab Potential Good   Clinical Impairments Affecting Rehab Potential decreased safety awareness   PT Treatment/Interventions ADLs/Self Care Home Management;Gait training;Stair training;Functional mobility training;Therapeutic activities;Therapeutic exercise;Balance training;Orthotic Fit/Training;Patient/family education;Manual techniques;Neuromuscular re-education;Vestibular;Canalith Repostioning;DME Instruction   PT Next Visit Plan Continue NMR to increase LE proprioceptive input, proximal stability, grading of weight shifting (consider Balance Master).   Consulted and Agree with Plan of Care Patient        Problem List Patient Active Problem List   Diagnosis Date Noted  . Diabetes type 2, uncontrolled ([place])   . Thoracic spine tumor 12/12/2014  . Neurogenic bowel 12/12/2014  . Incomplete paraplegia ([place]) 12/12/2014  . Back pain 12/09/2014  . Malignant tumor spinal cord ([place])  12/09/2014  . Pre-operative cardiovascular examination 09/17/2014  . Other fatigue 09/17/2014  . Chest pain with moderate risk for cardiac etiology 09/17/2014  . Ependymoma of spinal cord ([place]) 11/01/2013  . Unspecified hereditary and idiopathic peripheral neuropathy 11/01/2013  . Diastolic dysfunction without heart failure   . Obesity (BMI 30-39.9) 03/22/2013  . OSA on CPAP 03/19/2012  . H/O prostate cancer 03/19/2012  . Essential hypertension 03/19/2012  . Hyperlipidemia LDL goal <70 03/19/2012  . H/O hydrocele  03/19/2012  . Hx of melanoma excision 03/19/2012  . Personal history of colonic polyps 03/19/2012  . CAD S/P percutaneous coronary angioplasty: Cypher DES x2 to circumflex 08/17/2004   Billie Ruddy, PT, DPT [facility] [address] [place], [place], [address] Phone: [phone]   Fax:  [phone] 03/03/2015, 3:29 PM   Name: [name] MRN: [id number] Date of Birth: 09-14-1935

## 2015-03-05 ENCOUNTER — Telehealth: Payer: Self-pay | Admitting: Rehabilitation

## 2015-03-05 ENCOUNTER — Ambulatory Visit: Payer: Medicare Other | Attending: Neurosurgery | Admitting: Rehabilitation

## 2015-03-05 ENCOUNTER — Encounter: Payer: Self-pay | Admitting: Rehabilitation

## 2015-03-05 DIAGNOSIS — R269 Unspecified abnormalities of gait and mobility: Secondary | ICD-10-CM | POA: Diagnosis not present

## 2015-03-05 DIAGNOSIS — R201 Hypoesthesia of skin: Secondary | ICD-10-CM | POA: Diagnosis not present

## 2015-03-05 DIAGNOSIS — R2681 Unsteadiness on feet: Secondary | ICD-10-CM | POA: Insufficient documentation

## 2015-03-05 DIAGNOSIS — R42 Dizziness and giddiness: Secondary | ICD-10-CM | POA: Diagnosis not present

## 2015-03-05 DIAGNOSIS — G8220 Paraplegia, unspecified: Secondary | ICD-10-CM | POA: Diagnosis not present

## 2015-03-05 NOTE — Telephone Encounter (Signed)
Dr. Naaman Plummer and Ovidio Kin,   I am seeing [name] at [facility] neuro.  Note today that he states he was cleared by you all to drive.  I highly doubt this as he continues to have severe sensory and proprioceptive deficits that would make driving unsafe, but I wanted to make you aware that he is driving short distances.    Thanks,  Cameron Sprang, PT, MPT [facility] [address] [place], [place], [address] Phone: [phone]   Fax:  [phone] 03/05/2015, 12:59 PM

## 2015-03-05 NOTE — Patient Instructions (Signed)
   Place pillow on floor then get down onto floor with knees on pillow.  Make sure that couch or bed is in front of you in case you need support on your arms.  Transition from sitting on your heels to coming upright as in second picture with hips tucked in!! Perform x 15 reps, 2 times a day.

## 2015-03-05 NOTE — Therapy (Signed)
[place] [address] [place], [place], [address] Phone: [phone]   Fax:  [phone]  Physical Therapy Treatment  Patient Details  Name: [name] MRN: [id number] Date of Birth: [birth year] Referring Provider: Alger Simons, MD  Encounter Date: 03/05/2015      PT End of Session - 03/05/15 1108    Visit Number 4   Number of Visits 17   Date for PT Re-Evaluation 04/20/15   Authorization Type Medicare - G Codes and PN every 10 visits   Equipment Utilized During Treatment Gait belt   Activity Tolerance Patient tolerated treatment well   Behavior During Therapy [facility] for tasks assessed/performed      Past Medical History  Diagnosis Date  . History of prostate cancer 2004--  S/P SEED IMPLANTS     NO RECURRENCE  . S/P primary angioplasty with coronary stent 2006--      X2 DE STENTS - Cypher 2.5 mm postdilated to 2.75 mm (20 mm and 13 mm stents)  . Urgency of urination   . Frequency of urination   . H/O: gout STABLE  . Impaired hearing RIGHT HEARING AID  . Hydrocele, left   . History of melanoma excision 2010    FOREHEAD  . CAD S/P percutaneous coronary angioplasty CARDIOLOGIST-  DR DAVID [facility]    Myoview March 2014: No ischemia or infarction  . Diastolic dysfunction without heart failure     Moderate LVH, Gr 1 DD on Echo 2011; no CHF admissions, no tt on diuretisc  . Hyperlipidemia LDL goal <70   . Essential hypertension   . Colon polyps 05/10/2005    Hyperplastic polyps  . Depression   . OSA on CPAP     bipap  . Heart murmur     hx - no notable valvular lesion on Echo  . Cancer [facility])     prostate , melanoma head  . Arthritis   . Shingles 07  . Gout   . Complication of anesthesia     "takes long time to wake up"  . Diabetes mellitus type 2 with complications (HCC)     neuropathy; CAD borderline- no med    Past Surgical History  Procedure Laterality Date  . Prostate palladium gold seed  implants (118)  11-15-2002    PROSTATE CANCER  . Excision melanoma from forehead  2010  . Coronary angioplasty with stent placement  09-01-2004      Cypher DES x 2 d-m LCx 2.5 mm x 20 mm & 2.5 mm x 13 mm (~2.75 mm)  . Transthoracic echocardiogram  MAR [id number]    NORMAL EF > 55% / NORMAL FUNCTION WITH IMPAIRED RELAXATION AND MODERATE CONCENTRIC LVH LIKELY HYPERTENSIVE DISEASE  . Hydrocele excision  10/14/2011    Procedure: HYDROCELECTOMY ADULT;  Surgeon: Ailene Rud, MD;  Location: [facility];  Service: Urology;  Laterality: Left;  . Circumsision    . Laminectomy N/A 07/13/2012    Procedure: THORACIC LAMINECTOMY FOR RESECTION OF INTRAMEDULLARY SPINAL CHORD TUMOR WITH SPINAL CHORD MONITORING;  Surgeon: Erline Levine, MD;  Location: [place] NEURO [facility];  Service: Neurosurgery;  Laterality: N/A;  Thoracic laminectomy for resection of intramedullary spinal cord tumor with spinal cord monitering and Dr. Christella Noa to assist  . Nm myoview ltd  3/212014    Lexiscan: LOW RISK, mild inferior bowel artifact; No ischemia or Infarction  . Eye surgery Bilateral     cataracts  . Tonsillectomy    . Laminectomy N/A 12/09/2014  Procedure: Redo Thoracic laminectomy with intramedullary tumar resection/USN/[place]/Subarachnoid shunt/spinal cord monitoring/Dr. Kathyrn Sheriff to assist;  Surgeon: Erline Levine, MD;  Location: [place] [facility]UR[facility];  Service: Neurosurgery;  Laterality: N/A;  Redo Thoracic laminectomy with intramedullary tumar resection/USN/[place]/Subarachnoid shunt/spinal cord monitoring/Dr. Kathyrn Sheriff to assist    There were no vitals filed for this visit.  Visit Diagnosis:  No diagnosis found.      Subjective Assessment - 03/05/15 1106    Subjective Reports no changes since last visit, no falls.     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 No/denies               NMR: Addressed gait without AD with use of blue theraband on RLE under R forefoot behind knee and in front of hip for proprioceptive feedback for more efficient and quality gait with increased isolated hip/knee flexion during swing phase of gait.  Note marked improvement with theraband vs without x 345' at S level.  Pt voicing that he has knee brace at home.  Educated to bring in during next visit to trial during gait as this may provide some proprioceptive feedback during gait.  Pt verbalized understanding.  Progressed to // bars with band still present on RLE while performing tapping task while L LE remained stabilized in squat position.  Pt unable to perform accurately and demonstrates increased impulsivity with movement, therefore transitioned to cone tapping on red mat to address coordination, balance and BLE WB with SLS.  Performed x 12 reps progressing to knocking cone over and tipping back upright x same reps with max verbal cues for slower controlled movement.  Pt did progress within session.  Ended session with series of tall kneeling tasks.  Performed tall kneeling squats x 10 reps with tactile cues for increased hip extension, tall kneeling side stepping, and transitions from tall kneeling to half kneeling for increased proximal stabilization and increased proprioceptive feedback through LEs.    Self Care: Discussion with pt regarding driving.  Feel that pt is not safe to drive due to sensory and proprioceptive deficits.  Pt states he was cleared by MD, however feel that he is not.  Will send note to MD regarding matter.                      PT Short Term Goals - 02/25/15 1439    PT SHORT TERM GOAL #1   Title Pt will perform intiial HEP with mod I using paper handout to maximize functional gains made in PT. Target date:  03/19/15   PT SHORT TERM GOAL #2   Title Pt will improve Berg score from 28 to 31/56 to indicate improved functional standing balance. Target date: 03/19/15   PT SHORT TERM GOAL #3   Title Pt will negotiate 10 stairs with 2 rails and mod I to indicate pt safety accessing second floor of home. Target date: 03/19/15   PT SHORT TERM GOAL #4   Title Pt will ambulate 200' over level, indoor surfaces with mod I and safe gait speed and appropriate use of AD. Target date: 03/19/15   PT SHORT TERM GOAL #5   Title Further assess origin of dizziness, lightheadedness to prevent future falls. Target  date: 03/19/15.   Status Achieved           PT Long Term Goals - 02/19/15 1937    PT LONG TERM GOAL #1   Title Pt will verbalize understanding of fall prevention strategies in home environment to decrease risk of falls within home. Target date: 04/16/15   PT LONG TERM GOAL #2   Title Pt will improve Berg score from 28 to 34/56 to indicate significant improvement in functional standing balance. Target date: 04/16/15   PT LONG TERM GOAL #3   Title Pt will ambulte 1,000' over unlevel, paved surfaces with mod I using LRAD with good safety awareness and no overt LOB to indicate improved safety/independence with community mobility. Target date: 04/16/15   PT LONG TERM GOAL #4   Title Pt will negotiate standard ramp and curb step with mod I using LRAD to indicate pt ability to traverse community obstacles. Target date: 04/16/15   PT LONG TERM GOAL #5   Title Pt will verbalize ability to lead while dancing without loss of balance to enable pt to engage in leisure activities, improve QOL. Target date: 04/16/15               Problem List Patient Active Problem List   Diagnosis Date Noted  . Diabetes type 2, uncontrolled ([place])   . Thoracic spine tumor 12/12/2014  . Neurogenic bowel 12/12/2014  . Incomplete paraplegia ([place]) 12/12/2014  . Back pain 12/09/2014  . Malignant tumor spinal cord ([place])  12/09/2014  . Pre-operative cardiovascular examination 09/17/2014  . Other fatigue 09/17/2014  . Chest pain with moderate risk for cardiac etiology 09/17/2014  . Ependymoma of spinal cord ([place]) 11/01/2013  . Unspecified hereditary and idiopathic peripheral neuropathy 11/01/2013  . Diastolic dysfunction without heart failure   . Obesity (BMI 30-39.9) 03/22/2013  . OSA on CPAP 03/19/2012  . H/O prostate cancer 03/19/2012  . Essential hypertension 03/19/2012  . Hyperlipidemia LDL goal <70 03/19/2012  . H/O hydrocele 03/19/2012  . Hx of melanoma excision 03/19/2012  . Personal history of colonic polyps 03/19/2012  . CAD S/P percutaneous coronary angioplasty: Cypher DES x2 to circumflex 08/17/2004    Cameron Sprang, PT, MPT [facility] [address] [place], [place], [address] Phone: [phone]   Fax:  [phone] 03/05/2015, 12:47 PM  Name: BRANTSON DULAC MRN: [id number] Date of Birth: May 21, 1935

## 2015-03-11 ENCOUNTER — Ambulatory Visit: Payer: Medicare Other | Admitting: Physical Therapy

## 2015-03-11 DIAGNOSIS — R269 Unspecified abnormalities of gait and mobility: Secondary | ICD-10-CM | POA: Diagnosis not present

## 2015-03-11 DIAGNOSIS — R2681 Unsteadiness on feet: Secondary | ICD-10-CM

## 2015-03-11 DIAGNOSIS — R42 Dizziness and giddiness: Secondary | ICD-10-CM | POA: Diagnosis not present

## 2015-03-11 DIAGNOSIS — R201 Hypoesthesia of skin: Secondary | ICD-10-CM | POA: Diagnosis not present

## 2015-03-11 DIAGNOSIS — G8220 Paraplegia, unspecified: Secondary | ICD-10-CM | POA: Diagnosis not present

## 2015-03-11 NOTE — Therapy (Signed)
[facility] [address] [place], [place], [address] Phone: [phone]   Fax:  [phone]  Physical Therapy Treatment  Patient Details  Name: [name] MRN: [id number] Date of Birth: May 20, 1935 Referring Provider: Alger Simons, MD  Encounter Date: 03/11/2015      PT End of Session - 03/11/15 1338    Visit Number 5   Number of Visits 17   Date for PT Re-Evaluation 04/20/15   Authorization Type Medicare - G Codes and PN every 10 visits   PT Start Time 1104   PT Stop Time 1145   PT Time Calculation (min) 41 min   Equipment Utilized During Treatment Gait belt   Activity Tolerance Patient tolerated treatment well   Behavior During Therapy [facility] for tasks assessed/performed      Past Medical History  Diagnosis Date  . History of prostate cancer 2004--  S/P SEED IMPLANTS     NO RECURRENCE  . S/P primary angioplasty with coronary stent 2006--      X2 DE STENTS - Cypher 2.5 mm postdilated to 2.75 mm (20 mm and 13 mm stents)  . Urgency of urination   . Frequency of urination   . H/O: gout STABLE  . Impaired hearing RIGHT HEARING AID  . Hydrocele, left   . History of melanoma excision 2010    FOREHEAD  . CAD S/P percutaneous coronary angioplasty CARDIOLOGIST-  DR DAVID [facility]    Myoview March 2014: No ischemia or infarction  . Diastolic dysfunction without heart failure     Moderate LVH, Gr 1 DD on Echo 2011; no CHF admissions, no tt on diuretisc  . Hyperlipidemia LDL goal <70   . Essential hypertension   . Colon polyps 05/10/2005    Hyperplastic polyps  . Depression   . OSA on CPAP     bipap  . Heart murmur     hx - no notable valvular lesion on Echo  . Cancer [facility])     prostate , melanoma head  . Arthritis   . Shingles 07  . Gout   . Complication of anesthesia     "takes long time to wake up"  . Diabetes mellitus type 2 with complications (HCC)     neuropathy; CAD borderline- no med    Past  Surgical History  Procedure Laterality Date  . Prostate palladium gold seed implants (118)  11-15-2002    PROSTATE CANCER  . Excision melanoma from forehead  2010  . Coronary angioplasty with stent placement  09-01-2004      Cypher DES x 2 d-m LCx 2.5 mm x 20 mm & 2.5 mm x 13 mm (~2.75 mm)  . Transthoracic echocardiogram  MAR [id number]    NORMAL EF > 55% / NORMAL FUNCTION WITH IMPAIRED RELAXATION AND MODERATE CONCENTRIC LVH LIKELY HYPERTENSIVE DISEASE  . Hydrocele excision  10/14/2011    Procedure: HYDROCELECTOMY ADULT;  Surgeon: Ailene Rud, MD;  Location: [facility];  Service: Urology;  Laterality: Left;  . Circumsision    . Laminectomy N/A 07/13/2012    Procedure: THORACIC LAMINECTOMY FOR RESECTION OF INTRAMEDULLARY SPINAL CHORD TUMOR WITH SPINAL CHORD MONITORING;  Surgeon: Erline Levine, MD;  Location: [place] [facility]UR[facility]S;  Service: Neurosurgery;  Laterality: N/A;  Thoracic laminectomy for resection of intramedullary spinal cord tumor with spinal cord monitering and Dr. Christella Noa to assist  . Nm myoview ltd  3/212014    Lexiscan: LOW RISK, mild inferior bowel artifact; No ischemia or Infarction  .  Eye surgery Bilateral     cataracts  . Tonsillectomy    . Laminectomy N/A 12/09/2014    Procedure: Redo Thoracic laminectomy with intramedullary tumar resection/USN/Cusa/Subarachnoid shunt/spinal cord monitoring/Dr. Kathyrn Sheriff to assist;  Surgeon: Erline Levine, MD;  Location: [place] NEURO ORS;  Service: Neurosurgery;  Laterality: N/A;  Redo Thoracic laminectomy with intramedullary tumar resection/USN/Cusa/Subarachnoid shunt/spinal cord monitoring/Dr. Kathyrn Sheriff to assist    There were no vitals filed for this visit.  Visit Diagnosis:  Abnormality of gait  Unsteadiness  Impaired sensation      Subjective Assessment - 03/11/15 1108    Subjective Pt reports no falls, no significant changes since last session.    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 No/denies                         [facility] Adult PT Treatment/Exercise - 03/11/15 0001    Ambulation/Gait   Ambulation/Gait Yes   Ambulation/Gait Assistance 4: Min guard   Ambulation Distance (Feet) 230 Feet   Assistive device None   Gait Pattern Step-through pattern;Decreased stride length;Decreased hip/knee flexion - right;Decreased hip/knee flexion - left;Right foot flat;Left foot flat;Right flexed knee in stance;Left flexed knee in stance;Decreased trunk rotation;Decreased arm swing - right;Decreased arm swing - left   Ambulation Surface Level;Indoor   Gait Comments Cueing for slower movement, safe gait velocity.             Balance Exercises - 03/11/15 1330    Balance Exercises: Standing   Wall Bumps Hip   Wall Bumps-Hips Eyes opened;Anterior/posterior;Foam/compliant surface;20 reps;Other (comment)  x20 reps on solid surface, x20 reps on foam   Rockerboard Anterior/posterior;EO;10 reps;Intermittent UE support;Other (comment)  small rocker board; A/P weight shifting for control   Balance Master: Limits for Stability Performed the following x2 minutes each with LOS 50% (near targets to emphasize grading of movement), force plate/surround stable: anterior; posterior; center/anterior/posterior; center/left 3; center/right 3. Pt required cueing to lead with hips (rather than head/shoulders) during weight shifting to improve grading. Repeated center/anterior/posterior with force plate at [id number] responsive. Noted within-session improvement in grading of weight shifting and accuracy (less overshooting).           PT Education - 03/11/15 1326    Education provided Yes   Education Details Reiterated importance of HEP  compliance.   Person(s) Educated Patient   Methods Explanation   Comprehension Verbalized understanding          PT Short Term Goals - 02/25/15 1439    PT SHORT TERM GOAL #1   Title Pt will perform intiial HEP with mod I using paper handout to maximize functional gains made in PT. Target date: 03/19/15   PT SHORT TERM GOAL #2   Title Pt will improve Berg score from 28 to 31/56 to indicate improved functional standing balance. Target date: 03/19/15   PT SHORT TERM GOAL #3   Title Pt will negotiate 10 stairs with 2 rails and mod I to indicate pt safety accessing second floor of home. Target date: 03/19/15   PT SHORT TERM GOAL #4   Title Pt will ambulate 200' over level, indoor surfaces with mod I and safe gait speed and appropriate use of  AD. Target date: 03/19/15   PT SHORT TERM GOAL #5   Title Further assess origin of dizziness, lightheadedness to prevent future falls. Target date: 03/19/15.   Status Achieved           PT Long Term Goals - 02/19/15 1937    PT LONG TERM GOAL #1   Title Pt will verbalize understanding of fall prevention strategies in home environment to decrease risk of falls within home. Target date: 04/16/15   PT LONG TERM GOAL #2   Title Pt will improve Berg score from 28 to 34/56 to indicate significant improvement in functional standing balance. Target date: 04/16/15   PT LONG TERM GOAL #3   Title Pt will ambulte 1,000' over unlevel, paved surfaces with mod I using LRAD with good safety awareness and no overt LOB to indicate improved safety/independence with community mobility. Target date: 04/16/15   PT LONG TERM GOAL #4   Title Pt will negotiate standard ramp and curb step with mod I using LRAD to indicate pt ability to traverse community obstacles. Target date: 04/16/15   PT LONG TERM GOAL #5   Title Pt will verbalize ability to lead while dancing without loss of balance to enable pt to engage in leisure activities, improve QOL. Target date: 04/16/15                Plan - 03/11/15 1339    Clinical Impression Statement Session focused on grading of movements with emphasis on weight shifting. Utilized Pension scheme manager limits for stability; pt with improved awareness of speed of movement and improved grading of movement, weight shifting.   Pt will benefit from skilled therapeutic intervention in order to improve on the following deficits Abnormal gait;Decreased knowledge of use of DME;Decreased balance;Decreased safety awareness;Decreased coordination;Decreased mobility;Decreased strength;Dizziness;Impaired perceived functional ability;Impaired sensation   Rehab Potential Good   Clinical Impairments Affecting Rehab Potential decreased safety awareness   PT Frequency 2x / week   PT Duration 8 weeks   PT Treatment/Interventions ADLs/Self Care Home Management;Gait training;Stair training;Functional mobility training;Therapeutic activities;Therapeutic exercise;Balance training;Orthotic Fit/Training;Patient/family education;Manual techniques;Neuromuscular re-education;Vestibular;Canalith Repostioning;DME Instruction   PT Next Visit Plan Continue NMR to increase LE proprioceptive input, proximal stability, grading of weight shifting using Balance Master.   Consulted and Agree with Plan of Care Patient        Problem List Patient Active Problem List   Diagnosis Date Noted  . Diabetes type 2, uncontrolled ([place])   . Thoracic spine tumor 12/12/2014  . Neurogenic bowel 12/12/2014  . Incomplete paraplegia ([place]) 12/12/2014  . Back pain 12/09/2014  . Malignant tumor spinal cord ([place]) 12/09/2014  . Pre-operative cardiovascular examination 09/17/2014  . Other fatigue 09/17/2014  . Chest pain with moderate risk for cardiac etiology 09/17/2014  . Ependymoma of spinal cord ([place]) 11/01/2013  . Unspecified hereditary and idiopathic peripheral neuropathy 11/01/2013  . Diastolic dysfunction without heart failure   . Obesity (BMI 30-39.9) 03/22/2013  .  OSA on CPAP 03/19/2012  . H/O prostate cancer 03/19/2012  . Essential hypertension 03/19/2012  . Hyperlipidemia LDL goal <70 03/19/2012  . H/O hydrocele 03/19/2012  . Hx of melanoma excision 03/19/2012  . Personal history of colonic polyps 03/19/2012  . CAD S/P percutaneous coronary angioplasty: Cypher DES x2 to circumflex 08/17/2004    Billie Ruddy, PT, DPT [facility] [address] [place], [place], [address] Phone: [phone]   Fax:  [phone] 03/11/2015, 1:43 PM  Name: VALOR KOPPES MRN: [id number] Date of Birth: Aug 25, 1935

## 2015-03-13 ENCOUNTER — Ambulatory Visit: Payer: Medicare Other | Admitting: Physical Therapy

## 2015-03-13 DIAGNOSIS — G8220 Paraplegia, unspecified: Secondary | ICD-10-CM

## 2015-03-13 DIAGNOSIS — R201 Hypoesthesia of skin: Secondary | ICD-10-CM | POA: Diagnosis not present

## 2015-03-13 DIAGNOSIS — R269 Unspecified abnormalities of gait and mobility: Secondary | ICD-10-CM | POA: Diagnosis not present

## 2015-03-13 DIAGNOSIS — R42 Dizziness and giddiness: Secondary | ICD-10-CM | POA: Diagnosis not present

## 2015-03-13 DIAGNOSIS — R2681 Unsteadiness on feet: Secondary | ICD-10-CM | POA: Diagnosis not present

## 2015-03-13 NOTE — Patient Instructions (Addendum)
Balance: Eyes Closed while Standing on Pillow with Eyes Closed    Stand with your back to a corner with a stable chair in front of you. Stand on a pillow with eyes closed; try not to hold on unless you need to.  Hold for 30 seconds; 4 sets. Do this twice per day, every day.     Feet Apart (Compliant Surface) Head Motion - Eyes Open    Stand with back to corner with stable chair in front of you. Stand on pillow with feet shoulder width apart, move head slowly: up and down 10 times; right to left 10 times. Perform this 3 times per day.    Deep Squat    Stand in front of a stable chair or bed with feet shoulder width apart. Squat as though you're about to sit on the bed/chair (but don't actually sit down). Make sure your knees don't go over your toes. Return to starting position. Perform 15 reps, 3 times per day.   Weight Shift: Anterior / Posterior (Righting / Equilibrium)    BEGIN WITH BACK LEANING AGAINST THE WALL AND FEET 6 INCHES AWAY. Move your hips off the wall and come to upright standing.  Hold for 5 seconds. Return slowly to the wall letting your hips bump the wall, then touch your shoulders to the wall, as well. Hold for 5 seconds. Leading with your hips, raise your back off the wall to return to stand.  Repeat _10__ times per session. Do __3_ sessions per day.

## 2015-03-14 NOTE — Therapy (Signed)
[facility] [address] [place], [place], [address] Phone: [phone]   Fax:  [phone]  Physical Therapy Treatment  Patient Details  Name: [name] MRN: [id number] Date of Birth: 03/03/1936 Referring Provider: Alger Simons, MD  Encounter Date: 03/13/2015      PT End of Session - 03/14/15 1335    Visit Number 6   Number of Visits 17   Authorization Type Medicare - G Codes and PN every 10 visits   PT Start Time 1108   PT Stop Time 1147   PT Time Calculation (min) 39 min   Equipment Utilized During Treatment Gait belt   Activity Tolerance Patient tolerated treatment well   Behavior During Therapy [facility] for tasks assessed/performed      Past Medical History  Diagnosis Date  . History of prostate cancer 2004--  S/P SEED IMPLANTS     NO RECURRENCE  . S/P primary angioplasty with coronary stent 2006--      X2 DE STENTS - Cypher 2.5 mm postdilated to 2.75 mm (20 mm and 13 mm stents)  . Urgency of urination   . Frequency of urination   . H/O: gout STABLE  . Impaired hearing RIGHT HEARING AID  . Hydrocele, left   . History of melanoma excision 2010    FOREHEAD  . CAD S/P percutaneous coronary angioplasty CARDIOLOGIST-  DR DAVID [facility]    Myoview March 2014: No ischemia or infarction  . Diastolic dysfunction without heart failure     Moderate LVH, Gr 1 DD on Echo 2011; no CHF admissions, no tt on diuretisc  . Hyperlipidemia LDL goal <70   . Essential hypertension   . Colon polyps 05/10/2005    Hyperplastic polyps  . Depression   . OSA on CPAP     bipap  . Heart murmur     hx - no notable valvular lesion on Echo  . Cancer [facility])     prostate , melanoma head  . Arthritis   . Shingles 07  . Gout   . Complication of anesthesia     "takes long time to wake up"  . Diabetes mellitus type 2 with complications (HCC)     neuropathy; CAD borderline- no med    Past Surgical History  Procedure  Laterality Date  . Prostate palladium gold seed implants (118)  11-15-2002    PROSTATE CANCER  . Excision melanoma from forehead  2010  . Coronary angioplasty with stent placement  09-01-2004      Cypher DES x 2 d-m LCx 2.5 mm x 20 mm & 2.5 mm x 13 mm (~2.75 mm)  . Transthoracic echocardiogram  MAR 18,8416    NORMAL EF > 55% / NORMAL FUNCTION WITH IMPAIRED RELAXATION AND MODERATE CONCENTRIC LVH LIKELY HYPERTENSIVE DISEASE  . Hydrocele excision  10/14/2011    Procedure: HYDROCELECTOMY ADULT;  Surgeon: Ailene Rud, MD;  Location: [facility];  Service: Urology;  Laterality: Left;  . Circumsision    . Laminectomy N/A 07/13/2012    Procedure: THORACIC LAMINECTOMY FOR RESECTION OF INTRAMEDULLARY SPINAL CHORD TUMOR WITH SPINAL CHORD MONITORING;  Surgeon: Erline Levine, MD;  Location: [place] NEURO ORS;  Service: Neurosurgery;  Laterality: N/A;  Thoracic laminectomy for resection of intramedullary spinal cord tumor with spinal cord monitering and Dr. Christella Noa to assist  . Nm myoview ltd  3/212014    Lexiscan: LOW RISK, mild inferior bowel artifact; No ischemia or Infarction  . Eye surgery Bilateral  cataracts  . Tonsillectomy    . Laminectomy N/A 12/09/2014    Procedure: Redo Thoracic laminectomy with intramedullary tumar resection/USN/Cusa/Subarachnoid shunt/spinal cord monitoring/Dr. Kathyrn Sheriff to assist;  Surgeon: Erline Levine, MD;  Location: [place] NEURO ORS;  Service: Neurosurgery;  Laterality: N/A;  Redo Thoracic laminectomy with intramedullary tumar resection/USN/Cusa/Subarachnoid shunt/spinal cord monitoring/Dr. Kathyrn Sheriff to assist    There were no vitals filed for this visit.  Visit Diagnosis:  Abnormality of gait  Unsteadiness  Impaired sensation  Paraplegia (HCC)      Subjective Assessment - 03/13/15 1113    Subjective Pt reports no falls and significant changes. Pt report he has been performing home exercises!   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 No/denies                         [facility] Adult PT Treatment/Exercise - 03/14/15 0001    Ambulation/Gait   Ambulation/Gait Yes   Ambulation/Gait Assistance 5: Supervision  close supervision   Ambulation Distance (Feet) 200 Feet   Assistive device None   Gait Pattern Step-through pattern;Decreased stride length;Decreased hip/knee flexion - right;Decreased hip/knee flexion - left;Right foot flat;Left foot flat;Right flexed knee in stance;Left flexed knee in stance;Decreased arm swing - right;Decreased arm swing - left  excessive trunk movement   Ambulation Surface Level;Indoor   Gait Comments Improved LE placement but continues to exhibit excessive trunk movement.   Neuro Re-ed    Neuro Re-ed Details  Pt performed the following in tall kneeling to increase proprioceptive input in BLE's, to improve proximal stability/control: forward/retro stepping x10 steps per direction without UE support with LOB x3 due to excessive lateral trunk movement; lateral stepping 4 x5 steps per direction with no LOB. Multimodal cueing required for pt attention to trunk stability, stance stability (focus on RLE) throughout.   Exercises   Exercises Other Exercises   Other Exercises  With use of paper handout, pt performed home exercises effectively. See Pt Instructions for details on all exercises, reps, and sets. Progressed reps with squats and added wall bumps to HEP to increase motor control in B hip extensors and to increase effectiveness of hip strategy with LOB.             Balance Exercises - 03/13/15 1121    Balance Exercises: Standing   Standing Eyes Opened Wide (BOA);Head turns;Foam/compliant surface;Other  (comment)  horizontal x10 reps; vertical x10 reps   Standing Eyes Closed Wide (BOA);Foam/compliant surface   Wall Bumps Hip   Wall Bumps-Hips Eyes opened;Anterior/posterior;10 reps   Rockerboard --           PT Education - 03/13/15 1128    Education provided Yes   Education Details HEP progressed; see Pt Instructions. Discussed possible use of weighted/compressive garments to improve gait stability.   Person(s) Educated Patient;Spouse   Methods Explanation;Demonstration;Handout;Verbal cues   Comprehension Verbalized understanding;Returned demonstration          PT Short Term Goals - 03/14/15 1336    PT SHORT TERM GOAL #1   Title Pt will perform initial HEP with mod I using paper handout to maximize functional gains made in PT. Target date: 03/19/15   Baseline Met 12/9   Status  Achieved   PT SHORT TERM GOAL #2   Title Pt will improve Berg score from 28 to 31/56 to indicate improved functional standing balance. Target date: 03/19/15   PT SHORT TERM GOAL #3   Title Pt will negotiate 10 stairs with 2 rails and mod I to indicate pt safety accessing second floor of home. Target date: 03/19/15   PT SHORT TERM GOAL #4   Title Pt will ambulate 200' over level, indoor surfaces with mod I and safe gait speed and appropriate use of AD. Target date: 03/19/15   PT SHORT TERM GOAL #5   Title Further assess origin of dizziness, lightheadedness to prevent future falls. Target date: 03/19/15.   Status Achieved           PT Long Term Goals - 02/19/15 1937    PT LONG TERM GOAL #1   Title Pt will verbalize understanding of fall prevention strategies in home environment to decrease risk of falls within home. Target date: 04/16/15   PT LONG TERM GOAL #2   Title Pt will improve Berg score from 28 to 34/56 to indicate significant improvement in functional standing balance. Target date: 04/16/15   PT LONG TERM GOAL #3   Title Pt will ambulte 1,000' over unlevel, paved surfaces with mod I using  LRAD with good safety awareness and no overt LOB to indicate improved safety/independence with community mobility. Target date: 04/16/15   PT LONG TERM GOAL #4   Title Pt will negotiate standard ramp and curb step with mod I using LRAD to indicate pt ability to traverse community obstacles. Target date: 04/16/15   PT LONG TERM GOAL #5   Title Pt will verbalize ability to lead while dancing without loss of balance to enable pt to engage in leisure activities, improve QOL. Target date: 04/16/15               Plan - 03/14/15 1351    Clinical Impression Statement Pt met STG 1 for HEP performance and reports he has been performing HEP. Gait stability continues to be limited by impaired LE coordination (on R > L) and decreased trunk control. Current session focused on progressing HEP and using tall kneeling position to increase proprioceptive input/sensory awareness in BLE's, improving prioximal stability/control.    Pt will benefit from skilled therapeutic intervention in order to improve on the following deficits Abnormal gait;Decreased knowledge of use of DME;Decreased balance;Decreased safety awareness;Decreased coordination;Decreased mobility;Decreased strength;Dizziness;Impaired perceived functional ability;Impaired sensation   Rehab Potential Good   Clinical Impairments Affecting Rehab Potential decreased safety awareness   PT Frequency 2x / week   PT Duration 8 weeks   PT Treatment/Interventions ADLs/Self Care Home Management;Gait training;Stair training;Functional mobility training;Therapeutic activities;Therapeutic exercise;Balance training;Orthotic Fit/Training;Patient/family education;Manual techniques;Neuromuscular re-education;Vestibular;Canalith Repostioning;DME Instruction   PT Next Visit Plan Check remaining STG's. Ask if pt has tried compression stockings. Continue NMR for proximal stability, LE proprioceptive input, and grading of movement.   PT Home Exercise Plan See Pt  Instructions from 12/9.   Consulted and Agree with Plan of Care Patient        Problem List Patient Active Problem List   Diagnosis Date Noted  . Diabetes type 2, uncontrolled ([place])   . Thoracic spine tumor 12/12/2014  . Neurogenic bowel 12/12/2014  . Incomplete paraplegia ([place]) 12/12/2014  . Back pain 12/09/2014  . Malignant tumor spinal cord ([place]) 12/09/2014  . Pre-operative cardiovascular examination 09/17/2014  . Other fatigue 09/17/2014  . Chest pain with moderate risk for cardiac etiology  09/17/2014  . Ependymoma of spinal cord ([place]) 11/01/2013  . Unspecified hereditary and idiopathic peripheral neuropathy 11/01/2013  . Diastolic dysfunction without heart failure   . Obesity (BMI 30-39.9) 03/22/2013  . OSA on CPAP 03/19/2012  . H/O prostate cancer 03/19/2012  . Essential hypertension 03/19/2012  . Hyperlipidemia LDL goal <70 03/19/2012  . H/O hydrocele 03/19/2012  . Hx of melanoma excision 03/19/2012  . Personal history of colonic polyps 03/19/2012  . CAD S/P percutaneous coronary angioplasty: Cypher DES x2 to circumflex 08/17/2004    Billie Ruddy, PT, DPT [facility] [address] [place], [place], [address] Phone: [phone]   Fax:  [phone] 03/14/2015, 1:57 PM  Name: JAMARI MOTEN MRN: [id number] Date of Birth: [birth year]

## 2015-03-17 ENCOUNTER — Ambulatory Visit: Payer: Medicare Other | Admitting: Physical Therapy

## 2015-03-17 DIAGNOSIS — R201 Hypoesthesia of skin: Secondary | ICD-10-CM

## 2015-03-17 DIAGNOSIS — R2681 Unsteadiness on feet: Secondary | ICD-10-CM | POA: Diagnosis not present

## 2015-03-17 DIAGNOSIS — G8220 Paraplegia, unspecified: Secondary | ICD-10-CM

## 2015-03-17 DIAGNOSIS — R269 Unspecified abnormalities of gait and mobility: Secondary | ICD-10-CM | POA: Diagnosis not present

## 2015-03-17 DIAGNOSIS — R42 Dizziness and giddiness: Secondary | ICD-10-CM | POA: Diagnosis not present

## 2015-03-17 NOTE — Patient Instructions (Signed)
Fall Prevention in the Home  Falls can cause injuries and can affect people from all age groups. There are many simple things that you can do to make your home safe and to help prevent falls. WHAT CAN I DO ON THE OUTSIDE OF MY HOME?  Regularly repair the edges of walkways and driveways and fix any cracks.  Remove high doorway thresholds.  Trim any shrubbery on the main path into your home.  Use bright outdoor lighting.  Clear walkways of debris and clutter, including tools and rocks.  Regularly check that handrails are securely fastened and in good repair. Both sides of any steps should have handrails.  Install guardrails along the edges of any raised decks or porches.  Have leaves, snow, and ice cleared regularly.  Use sand or salt on walkways during winter months.  In the garage, clean up any spills right away, including grease or oil spills. WHAT CAN I DO IN THE BATHROOM?  Use night lights.  Install grab bars by the toilet and in the tub and shower. Do not use towel bars as grab bars.  Use non-skid mats or decals on the floor of the tub or shower.  If you need to sit down while you are in the shower, use a plastic, non-slip stool..  Keep the floor dry. Immediately clean up any water that spills on the floor.  Remove soap buildup in the tub or shower on a regular basis.  Attach bath mats securely with double-sided non-slip rug tape.  Remove throw rugs and other tripping hazards from the floor. WHAT CAN I DO IN THE BEDROOM?  Use night lights.  Make sure that a bedside light is easy to reach.  Do not use oversized bedding that drapes onto the floor.  Have a firm chair that has side arms to use for getting dressed.  Remove throw rugs and other tripping hazards from the floor. WHAT CAN I DO IN THE KITCHEN?   Clean up any spills right away.  Avoid walking on wet floors.  Place frequently used items in easy-to-reach places.  If you need to reach for something  above you, use a sturdy step stool that has a grab bar.  Keep electrical cables out of the way.  Do not use floor polish or wax that makes floors slippery. If you have to use wax, make sure that it is non-skid floor wax.  Remove throw rugs and other tripping hazards from the floor. WHAT CAN I DO IN THE STAIRWAYS?  Do not leave any items on the stairs.  Make sure that there are handrails on both sides of the stairs. Fix handrails that are broken or loose. Make sure that handrails are as long as the stairways.  Check any carpeting to make sure that it is firmly attached to the stairs. Fix any carpet that is loose or worn.  Avoid having throw rugs at the top or bottom of stairways, or secure the rugs with carpet tape to prevent them from moving.  Make sure that you have a light switch at the top of the stairs and the bottom of the stairs. If you do not have them, have them installed. WHAT ARE SOME OTHER FALL PREVENTION TIPS?  Wear closed-toe shoes that fit well and support your feet. Wear shoes that have rubber soles or low heels.  When you use a stepladder, make sure that it is completely opened and that the sides are firmly locked. Have someone hold the ladder while you   are using it. Do not climb a closed stepladder.  Add color or contrast paint or tape to grab bars and handrails in your home. Place contrasting color strips on the first and last steps.  Use mobility aids as needed, such as canes, walkers, scooters, and crutches.  Turn on lights if it is dark. Replace any light bulbs that burn out.  Set up furniture so that there are clear paths. Keep the furniture in the same spot.  Fix any uneven floor surfaces.  Choose a carpet design that does not hide the edge of steps of a stairway.  Be aware of any and all pets.  Review your medicines with your healthcare provider. Some medicines can cause dizziness or changes in blood pressure, which increase your risk of falling. Talk  with your health care provider about other ways that you can decrease your risk of falls. This may include working with a physical therapist or trainer to improve your strength, balance, and endurance.   This information is not intended to replace advice given to you by your health care provider. Make sure you discuss any questions you have with your health care provider.   Document Released: 03/11/2002 Document Revised: 08/05/2014 Document Reviewed: 04/25/2014 Elsevier Interactive Patient Education 2016 [employer]  

## 2015-03-17 NOTE — Therapy (Signed)
[facility] [address] [place], [place], [address] Phone: [phone]   Fax:  [phone]  Physical Therapy Treatment  Patient Details  Name: [name] MRN: [id number] Date of Birth: 04-07-1935 Referring Provider: Alger Simons, MD  Encounter Date: 03/17/2015      PT End of Session - 03/17/15 1255    Visit Number 7   Number of Visits 17   Date for PT Re-Evaluation 04/20/15   Authorization Type Medicare - G Codes and PN every 10 visits   PT Start Time 1105   PT Stop Time 1148   PT Time Calculation (min) 43 min   Equipment Utilized During Treatment Gait belt   Activity Tolerance Patient tolerated treatment well   Behavior During Therapy [facility] for tasks assessed/performed      Past Medical History  Diagnosis Date  . History of prostate cancer 2004--  S/P SEED IMPLANTS     NO RECURRENCE  . S/P primary angioplasty with coronary stent 2006--      X2 DE STENTS - Cypher 2.5 mm postdilated to 2.75 mm (20 mm and 13 mm stents)  . Urgency of urination   . Frequency of urination   . H/O: gout STABLE  . Impaired hearing RIGHT HEARING AID  . Hydrocele, left   . History of melanoma excision 2010    FOREHEAD  . CAD S/P percutaneous coronary angioplasty CARDIOLOGIST-  DR DAVID [facility]    Myoview March 2014: No ischemia or infarction  . Diastolic dysfunction without heart failure     Moderate LVH, Gr 1 DD on Echo 2011; no CHF admissions, no tt on diuretisc  . Hyperlipidemia LDL goal <70   . Essential hypertension   . Colon polyps 05/10/2005    Hyperplastic polyps  . Depression   . OSA on CPAP     bipap  . Heart murmur     hx - no notable valvular lesion on Echo  . Cancer [facility])     prostate , melanoma head  . Arthritis   . Shingles 07  . Gout   . Complication of anesthesia     "takes long time to wake up"  . Diabetes mellitus type 2 with complications (HCC)     neuropathy; CAD borderline- no med     Past Surgical History  Procedure Laterality Date  . Prostate palladium gold seed implants (118)  11-15-2002    PROSTATE CANCER  . Excision melanoma from forehead  2010  . Coronary angioplasty with stent placement  09-01-2004      Cypher DES x 2 d-m LCx 2.5 mm x 20 mm & 2.5 mm x 13 mm (~2.75 mm)  . Transthoracic echocardiogram  MAR 89,2119    NORMAL EF > 55% / NORMAL FUNCTION WITH IMPAIRED RELAXATION AND MODERATE CONCENTRIC LVH LIKELY HYPERTENSIVE DISEASE  . Hydrocele excision  10/14/2011    Procedure: HYDROCELECTOMY ADULT;  Surgeon: Ailene Rud, MD;  Location: [facility];  Service: Urology;  Laterality: Left;  . Circumsision    . Laminectomy N/A 07/13/2012    Procedure: THORACIC LAMINECTOMY FOR RESECTION OF INTRAMEDULLARY SPINAL CHORD TUMOR WITH SPINAL CHORD MONITORING;  Surgeon: Erline Levine, MD;  Location: [facility];  Service: Neurosurgery;  Laterality: N/A;  Thoracic laminectomy for resection of intramedullary spinal cord tumor with spinal cord monitering and Dr. Christella Noa to assist  . Nm myoview ltd  [id number]/[id number]    Lexiscan: LOW RISK, mild inferior bowel artifact; No ischemia or Infarction  .  Eye surgery Bilateral     cataracts  . Tonsillectomy    . Laminectomy N/A 12/09/2014    Procedure: Redo Thoracic laminectomy with intramedullary tumar resection/USN/[place]/Subarachnoid shunt/spinal cord monitoring/Dr. Kathyrn Sheriff to assist;  Surgeon: Erline Levine, MD;  Location: [place] [facility];  Service: Neurosurgery;  Laterality: N/A;  Redo Thoracic laminectomy with intramedullary tumar resection/USN/[place]/Subarachnoid shunt/spinal cord monitoring/Dr. Kathyrn Sheriff to assist    There were no vitals filed for this visit.  Visit Diagnosis:  Abnormality of gait  Unsteadiness  Impaired sensation  Paraplegia (HCC)      Subjective Assessment - 03/17/15 1109    Subjective Pt denies falls but states, "I came close one or two times." One near-fall was secondary to pt having stood up  from recliner too quickly. Other near-fall occurred when pt took trash out at night when it was dark outside. Pt wearing neoprene brace on R knee today.    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 No/denies            [facility] PT Assessment - 03/17/15 0001    Berg Balance Test   Sit to Stand Able to stand without using hands and stabilize independently   Standing Unsupported Able to stand 2 minutes with supervision   Sitting with Back Unsupported but Feet Supported on Floor or Stool Able to sit safely and securely 2 minutes   Stand to Sit Uses backs of legs against chair to control descent   Transfers Able to transfer safely, minor use of hands   Standing Unsupported with Eyes Closed Able to stand 10 seconds with supervision   Standing Ubsupported with Feet Together Able to place feet together independently and stand for 1 minute with supervision   From Standing, Reach Forward with Outstretched Arm Can reach confidently >25 cm (10")   From Standing Position, Pick up Object from [place] to pick up shoe, needs supervision   From Standing Position, Turn to Look Behind Over each Shoulder Looks behind one side only/other side shows less weight shift   Turn 360 Degrees Able to turn 360 degrees safely one side only in 4 seconds or less   Standing Unsupported, Alternately Place Feet on Step/Stool Needs assistance to keep from falling or unable to try   Standing Unsupported, One Foot in Front Needs help to step but can hold 15 seconds  requires close (S) for small step, hold x30 sec   Standing on One Leg Unable to try or needs assist to prevent fall   Total Score 37                     [facility] Adult PT  Treatment/Exercise - 03/17/15 0001    Ambulation/Gait   Ambulation/Gait Yes   Ambulation/Gait Assistance 5: Supervision;4: Min guard   Ambulation/Gait Assistance Details 1# weights on B ankles during gait x210 for increased proprioceptive input/sensory awareness. Cueing focused on increased R hip/knee flexion during RLE advancement and on more prominent B heel strike.   Ambulation Distance (Feet) 420 Feet   Assistive device None   Gait Pattern Step-through pattern;Decreased stride length;Decreased hip/knee flexion - right;Decreased hip/knee flexion - left;Right foot flat;Left foot flat;Right flexed knee in stance;Left flexed knee in stance;Decreased arm  swing - right;Decreased arm swing - left   Ambulation Surface Level;Indoor   Ramp 5: Supervision   Ramp Details (indicate cue type and reason) using SPC   Curb 5: Supervision   Curb Details (indicate cue type and reason) using [facility]                PT Education - 03/17/15 1250    Education provided Yes   Education Details Fall prevention strategies in home environment; see Pt Instructions. STG's, Berg findings, progress, ongoing risk of falling.  Discussed poor safety awareness as primary barrier to progress.   Methods Explanation   Comprehension Verbalized understanding          PT Short Term Goals - 03/17/15 1119    PT SHORT TERM GOAL #1   Title Pt will perform initial HEP with mod I using paper handout to maximize functional gains made in PT. Target date: 03/19/15   Baseline Met 12/9   Status Achieved   PT SHORT TERM GOAL #2   Title Pt will improve Berg score from 28 to 31/56 to indicate improved functional standing balance. Target date: 03/19/15   Baseline B12/23: Berg score = 37/56.   Status Achieved   PT SHORT TERM GOAL #3   Title Pt will negotiate 10 stairs with 2 rails and mod I to indicate pt safety accessing second floor of home. Target date: 03/19/15   Baseline Met 12/13.   Status Achieved   PT SHORT TERM GOAL #4    Title Pt will ambulate 200' over level, indoor surfaces with mod I and safe gait speed and appropriate use of AD. Target date: 03/19/15   Baseline Pt continues to require supervision at times for gait > 200' with SPC due to poor safety awareness, unsafe gait speed.   Status Partially Met   PT SHORT TERM GOAL #5   Title Further assess origin of dizziness, lightheadedness to prevent future falls. Target date: 03/19/15.   Status Achieved           PT Long Term Goals - 03/17/15 1134    PT LONG TERM GOAL #1   Title Pt will verbalize understanding of fall prevention strategies in home environment to decrease risk of falls within home. Target date: 04/16/15   Baseline Discussed and provided handoutn on 12/13. Pt will require reinforcement.   Status On-going   PT LONG TERM GOAL #2   Title Pt will improve Berg score from 28 to 41/56 to indicate significant improvement in functional standing balance. Target date: 04/16/15   Baseline 12/13: REVISED from 34/56 to 41/56 due to pt progressing quickly.   Status Revised   PT LONG TERM GOAL #3   Title Pt will ambulte 1,000' over unlevel, paved surfaces with mod I using LRAD with good safety awareness and no overt LOB to indicate improved safety/independence with community mobility. Target date: 04/16/15   PT LONG TERM GOAL #4   Title Pt will negotiate standard ramp and curb step with mod I using LRAD to indicate pt ability to traverse community obstacles. Target date: 04/16/15   PT LONG TERM GOAL #5   Title Pt will verbalize ability to lead while dancing without loss of balance to enable pt to engage in leisure activities, improve QOL. Target date: 04/16/15               Plan - 03/17/15 1257    Clinical Impression Statement Pt has met 4 of 5 STG's, suggesting improvement in the following areas:  HEP compliance; functional standing balance; and safety negotiating stairs. Berg score has improved from 28 to 37/56 since PT evaluation; therefore,  upgraded LTG for Berg to 41/56. Education focused on safety awareness, fall prevention strategies.    Pt will benefit from skilled therapeutic intervention in order to improve on the following deficits Abnormal gait;Decreased knowledge of use of DME;Decreased balance;Decreased safety awareness;Decreased coordination;Decreased mobility;Decreased strength;Dizziness;Impaired perceived functional ability;Impaired sensation   Rehab Potential Good   Clinical Impairments Affecting Rehab Potential decreased safety awareness   PT Frequency 2x / week   PT Duration 8 weeks   PT Treatment/Interventions ADLs/Self Care Home Management;Gait training;Stair training;Functional mobility training;Therapeutic activities;Therapeutic exercise;Balance training;Orthotic Fit/Training;Patient/family education;Manual techniques;Neuromuscular re-education;Vestibular;Canalith Repostioning;DME Instruction   PT Next Visit Plan Continue NMR for proximal stability, LE proprioceptive input, and grading of movement.   Consulted and Agree with Plan of Care Patient        Problem List Patient Active Problem List   Diagnosis Date Noted  . Diabetes type 2, uncontrolled ([place])   . Thoracic spine tumor 12/12/2014  . Neurogenic bowel 12/12/2014  . Incomplete paraplegia ([place]) 12/12/2014  . Back pain 12/09/2014  . Malignant tumor spinal cord ([place]) 12/09/2014  . Pre-operative cardiovascular examination 09/17/2014  . Other fatigue 09/17/2014  . Chest pain with moderate risk for cardiac etiology 09/17/2014  . Ependymoma of spinal cord ([place]) 11/01/2013  . Unspecified hereditary and idiopathic peripheral neuropathy 11/01/2013  . Diastolic dysfunction without heart failure   . Obesity (BMI 30-39.9) 03/22/2013  . OSA on CPAP 03/19/2012  . H/O prostate cancer 03/19/2012  . Essential hypertension 03/19/2012  . Hyperlipidemia LDL goal <70 03/19/2012  . H/O hydrocele 03/19/2012  . Hx of melanoma excision 03/19/2012  . Personal history  of colonic polyps 03/19/2012  . CAD S/P percutaneous coronary angioplasty: Cypher DES x2 to circumflex 08/17/2004    Billie Ruddy, PT, DPT [facility] [address] [place], [place], [address] Phone: [phone]   Fax:  [phone] 03/17/2015, 1:01 PM   Name: JOAB CARDEN MRN: [id number] Date of Birth: 1936/03/24

## 2015-03-20 ENCOUNTER — Encounter: Payer: Self-pay | Admitting: Physical Therapy

## 2015-03-20 ENCOUNTER — Ambulatory Visit: Payer: Medicare Other | Admitting: Physical Therapy

## 2015-03-20 DIAGNOSIS — R2681 Unsteadiness on feet: Secondary | ICD-10-CM | POA: Diagnosis not present

## 2015-03-20 DIAGNOSIS — R201 Hypoesthesia of skin: Secondary | ICD-10-CM | POA: Diagnosis not present

## 2015-03-20 DIAGNOSIS — R42 Dizziness and giddiness: Secondary | ICD-10-CM

## 2015-03-20 DIAGNOSIS — G8220 Paraplegia, unspecified: Secondary | ICD-10-CM | POA: Diagnosis not present

## 2015-03-20 DIAGNOSIS — R269 Unspecified abnormalities of gait and mobility: Secondary | ICD-10-CM

## 2015-03-20 NOTE — Therapy (Signed)
[facility] [address] [place], [place], [address] Phone: [phone]   Fax:  [phone]  Physical Therapy Treatment  Patient Details  Name: [name] MRN: [id number] Date of Birth: Jul 04, 1935 Referring Provider: Alger Simons, MD  Encounter Date: 03/20/2015   03/20/15 1107  PT Visits / Re-Eval  Visit Number 8  Number of Visits 17  Date for PT Re-Evaluation 04/20/15  Authorization  Authorization Type Medicare - G Codes and PN every 10 visits  PT Time Calculation  PT Start Time 1104  PT Stop Time 1145  PT Time Calculation (min) 41 min  PT - End of Session  Equipment Utilized During Treatment Gait belt  Activity Tolerance Patient tolerated treatment well  Behavior During Therapy [facility] for tasks assessed/performed     Past Medical History  Diagnosis Date  . History of prostate cancer 2004--  S/P SEED IMPLANTS     NO RECURRENCE  . S/P primary angioplasty with coronary stent 2006--      X2 DE STENTS - Cypher 2.5 mm postdilated to 2.75 mm (20 mm and 13 mm stents)  . Urgency of urination   . Frequency of urination   . H/O: gout STABLE  . Impaired hearing RIGHT HEARING AID  . Hydrocele, left   . History of melanoma excision 2010    FOREHEAD  . CAD S/P percutaneous coronary angioplasty CARDIOLOGIST-  DR DAVID [facility]    Myoview March 2014: No ischemia or infarction  . Diastolic dysfunction without heart failure     Moderate LVH, Gr 1 DD on Echo 2011; no CHF admissions, no tt on diuretisc  . Hyperlipidemia LDL goal <70   . Essential hypertension   . Colon polyps 05/10/2005    Hyperplastic polyps  . Depression   . OSA on CPAP     bipap  . Heart murmur     hx - no notable valvular lesion on Echo  . Cancer [facility])     prostate , melanoma head  . Arthritis   . Shingles 07  . Gout   . Complication of anesthesia     "takes long time to wake up"  . Diabetes mellitus type 2 with complications (HCC)    neuropathy; CAD borderline- no med    Past Surgical History  Procedure Laterality Date  . Prostate palladium gold seed implants (118)  11-15-2002    PROSTATE CANCER  . Excision melanoma from forehead  2010  . Coronary angioplasty with stent placement  09-01-2004      Cypher DES x 2 d-m LCx 2.5 mm x 20 mm & 2.5 mm x 13 mm (~2.75 mm)  . Transthoracic echocardiogram  MAR 91,7915    NORMAL EF > 55% / NORMAL FUNCTION WITH IMPAIRED RELAXATION AND MODERATE CONCENTRIC LVH LIKELY HYPERTENSIVE DISEASE  . Hydrocele excision  10/14/2011    Procedure: HYDROCELECTOMY ADULT;  Surgeon: Ailene Rud, MD;  [facility];  Service: Urology;  Laterality: Left;  . Circumsision    . Laminectomy N/A 07/13/2012    Procedure: THORACIC LAMINECTOMY FOR RESECTION OF INTRAMEDULLARY SPINAL CHORD TUMOR WITH SPINAL CHORD MONITORING;  Surgeon: Erline Levine, MD;  Location: [place] [facility];  Service: Neurosurgery;  Laterality: N/A;  Thoracic laminectomy for resection of intramedullary spinal cord tumor with spinal cord monitering and Dr. Christella Noa to assist  . Nm myoview ltd  3/212014    Lexiscan: LOW RISK, mild inferior bowel artifact; No ischemia or Infarction  . Eye surgery Bilateral  cataracts  . Tonsillectomy    . Laminectomy N/A 12/09/2014    Procedure: Redo Thoracic laminectomy with intramedullary tumar resection/USN/[place]/Subarachnoid shunt/spinal cord monitoring/Dr. Kathyrn Sheriff to assist;  Surgeon: Erline Levine, MD;  Location: [place] [facility];  Service: Neurosurgery;  Laterality: N/A;  Redo Thoracic laminectomy with intramedullary tumar resection/USN/[place]/Subarachnoid shunt/spinal cord monitoring/Dr. Kathyrn Sheriff to assist    There were no vitals filed for this visit.  Visit Diagnosis:   Abnormality of gait     Unsteadiness   .  Dizziness and giddiness        Subjective Assessment - 03/20/15 1106    Subjective No new complaints. No falls or pain to report. No additional close calls to  report. Has right knee neoprene brace on today. Reports doing HEP now after last visit.   Currently in Pain? No/denies   Multiple Pain Sites No     Treatment: Neuro Re-ed: Quadruped - Alternating UE/leg raises, contralateral sides, x 10 each side with min assist and cues on form and technique  Tall kneeling - With weighted ball: UE raises x 10 reps, Upper trunk rotation left<>right x 10 each way and chest press x 10 reps. Cues on form and posture.  - Walking fwd/bwd, laterally across mat, 4 laps each with min guard assist and cues on posture and ex technique. Cues for increased hip hike to clear legs with tall kneeling walking.  With blue foam beam: - sit<>stand x 10 reps with min assist to mod assist for balance - staind with feet across beam: alternating fwd heel taps x 10 each side with up to mod assist for balance - staing with feet across beam: attempted bwd toe taps, pt with several significant balance losses therefore activity terminated for safety  Balance board Sit<>stand: x 10 rep each way with board, emphasis on posture, weight shifting and ex form/technique.  On ramp: wide BOS, perfored this activity both facing up and down ramp: eyes closed no head movements, eyes open head nods/shakes/diagonals. Min to mod assist for balance with cues on posture and weight shifting with activity.          PT Short Term Goals - 03/17/15 1119    PT SHORT TERM GOAL #1   Title Pt will perform initial HEP with mod I using paper handout to maximize functional gains made in PT. Target date: 03/19/15   Baseline Met 12/9   Status Achieved   PT SHORT TERM GOAL #2   Title Pt will improve Berg score from 28 to 31/56 to indicate improved functional standing balance. Target date: 03/19/15   Baseline B12/23: Berg score = 37/56.   Status Achieved   PT SHORT TERM GOAL #3   Title Pt will negotiate 10 stairs with 2 rails and mod I to indicate pt safety accessing second floor of home. Target date:  03/19/15   Baseline Met 12/13.   Status Achieved   PT SHORT TERM GOAL #4   Title Pt will ambulate 200' over level, indoor surfaces with mod I and safe gait speed and appropriate use of AD. Target date: 03/19/15   Baseline Pt continues to require supervision at times for gait > 200' with SPC due to poor safety awareness, unsafe gait speed.   Status Partially Met   PT SHORT TERM GOAL #5   Title Further assess origin of dizziness, lightheadedness to prevent future falls. Target date: 03/19/15.   Status Achieved           PT Long Term Goals - 03/17/15  [place] #1   Title Pt will verbalize understanding of fall prevention strategies in home environment to decrease risk of falls within home. Target date: 04/16/15   Baseline Discussed and provided handoutn on 12/13. Pt will require reinforcement.   Status On-going   PT LONG TERM GOAL #2   Title Pt will improve Berg score from 28 to 41/56 to indicate significant improvement in functional standing balance. Target date: 04/16/15   Baseline 12/13: REVISED from 34/56 to 41/56 due to pt progressing quickly.   Status Revised   PT LONG TERM GOAL #3   Title Pt will ambulte 1,000' over unlevel, paved surfaces with mod I using LRAD with good safety awareness and no overt LOB to indicate improved safety/independence with community mobility. Target date: 04/16/15   PT LONG TERM GOAL #4   Title Pt will negotiate standard ramp and curb step with mod I using LRAD to indicate pt ability to traverse community obstacles. Target date: 04/16/15   PT LONG TERM GOAL #5   Title Pt will verbalize ability to lead while dancing without loss of balance to enable pt to engage in leisure activities, improve QOL. Target date: 04/16/15        03/20/15 1108  Plan  Clinical Impression Statement Pt making steady progress toward goals. Continues to be limited on compliant surfaces and with decreased visual imput, needing increased assistance during these  activities. Pt also continues to demo decreased hip>ankle strategies.                                             Pt will benefit from skilled therapeutic intervention in order to improve on the following deficits Abnormal gait;Decreased knowledge of use of DME;Decreased balance;Decreased safety awareness;Decreased coordination;Decreased mobility;Decreased strength;Dizziness;Impaired perceived functional ability;Impaired sensation  Rehab Potential Good  Clinical Impairments Affecting Rehab Potential decreased safety awareness  PT Frequency 2x / week  PT Duration 8 weeks  PT Treatment/Interventions ADLs/Self Care Home Management;Gait training;Stair training;Functional mobility training;Therapeutic activities;Therapeutic exercise;Balance training;Orthotic Fit/Training;Patient/family education;Manual techniques;Neuromuscular re-education;Vestibular;Canalith Repostioning;DME Instruction  PT Next Visit Plan Continue NMR for proximal stability, LE proprioceptive input, and grading of movement.  Consulted and Agree with Plan of Care Patient      Problem List Patient Active Problem List   Diagnosis Date Noted  . Diabetes type 2, uncontrolled ([place])   . Thoracic spine tumor 12/12/2014  . Neurogenic bowel 12/12/2014  . Incomplete paraplegia ([place]) 12/12/2014  . Back pain 12/09/2014  . Malignant tumor spinal cord ([place]) 12/09/2014  . Pre-operative cardiovascular examination 09/17/2014  . Other fatigue 09/17/2014  . Chest pain with moderate risk for cardiac etiology 09/17/2014  . 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 Unspecified hereditary and idiopathic peripheral neuropathy 11/01/2013  . Diastolic dysfunction without heart failure   . Obesity (BMI 30-39.9) 03/22/2013  . OSA on CPAP 03/19/2012  . H/O prostate cancer 03/19/2012  . Essential hypertension 03/19/2012  . Hyperlipidemia LDL goal <70 03/19/2012  . H/O hydrocele 03/19/2012  . Hx of melanoma excision 03/19/2012  . Personal  history of colonic polyps 03/19/2012  . CAD S/P percutaneous coronary angioplasty: Cypher DES x2 to circumflex 08/17/2004    Willow Ora 03/20/2015, 11:08 AM  Willow Ora, PTA, [facility] [address], [place], [place] [address] [phone] 03/20/2015, 11:08 AM   Name: Charles  TEGH [name] MRN: [id number] Date of Birth: 1935-06-16

## 2015-03-24 ENCOUNTER — Ambulatory Visit: Payer: Medicare Other | Admitting: Physical Therapy

## 2015-03-24 DIAGNOSIS — R42 Dizziness and giddiness: Secondary | ICD-10-CM | POA: Diagnosis not present

## 2015-03-24 DIAGNOSIS — R2681 Unsteadiness on feet: Secondary | ICD-10-CM | POA: Diagnosis not present

## 2015-03-24 DIAGNOSIS — G8220 Paraplegia, unspecified: Secondary | ICD-10-CM | POA: Diagnosis not present

## 2015-03-24 DIAGNOSIS — R269 Unspecified abnormalities of gait and mobility: Secondary | ICD-10-CM | POA: Diagnosis not present

## 2015-03-24 DIAGNOSIS — R201 Hypoesthesia of skin: Secondary | ICD-10-CM | POA: Diagnosis not present

## 2015-03-24 NOTE — Therapy (Signed)
[facility] [address] [place], [place], [address] Phone: [phone]   Fax:  [phone]  Physical Therapy Treatment  Patient Details  Name: [name] MRN: [id number] Date of Birth: May 20, 1935 Referring Provider: Alger Simons, MD  Encounter Date: 03/24/2015      PT End of Session - 03/24/15 1802    Visit Number 9   Number of Visits 17   Date for PT Re-Evaluation 04/20/15   Authorization Type Medicare - G Codes and PN every 10 visits   PT Start Time 1101   PT Stop Time 1147   PT Time Calculation (min) 46 min   Activity Tolerance Patient tolerated treatment well   Behavior During Therapy [facility] for tasks assessed/performed      Past Medical History  Diagnosis Date  . History of prostate cancer 2004--  S/P SEED IMPLANTS     NO RECURRENCE  . S/P primary angioplasty with coronary stent 2006--      X2 DE STENTS - Cypher 2.5 mm postdilated to 2.75 mm (20 mm and 13 mm stents)  . Urgency of urination   . Frequency of urination   . H/O: gout STABLE  . Impaired hearing RIGHT HEARING AID  . Hydrocele, left   . History of melanoma excision 2010    FOREHEAD  . CAD S/P percutaneous coronary angioplasty CARDIOLOGIST-  DR DAVID [facility]    Myoview March 2014: No ischemia or infarction  . Diastolic dysfunction without heart failure     Moderate LVH, Gr 1 DD on Echo 2011; no CHF admissions, no tt on diuretisc  . Hyperlipidemia LDL goal <70   . Essential hypertension   . Colon polyps 05/10/2005    Hyperplastic polyps  . Depression   . OSA on CPAP     bipap  . Heart murmur     hx - no notable valvular lesion on Echo  . Cancer [facility])     prostate , melanoma head  . Arthritis   . Shingles 07  . Gout   . Complication of anesthesia     "takes long time to wake up"  . Diabetes mellitus type 2 with complications (HCC)     neuropathy; CAD borderline- no med    Past Surgical History  Procedure Laterality Date   . Prostate palladium gold seed implants (118)  11-15-2002    PROSTATE CANCER  . Excision melanoma from forehead  2010  . Coronary angioplasty with stent placement  09-01-2004      Cypher DES x 2 d-m LCx 2.5 mm x 20 mm & 2.5 mm x 13 mm (~2.75 mm)  . Transthoracic echocardiogram  MAR 69,6295    NORMAL EF > 55% / NORMAL FUNCTION WITH IMPAIRED RELAXATION AND MODERATE CONCENTRIC LVH LIKELY HYPERTENSIVE DISEASE  . Hydrocele excision  10/14/2011    Procedure: HYDROCELECTOMY ADULT;  Surgeon: Ailene Rud, MD;  Location: [facility];  Service: Urology;  Laterality: Left;  . Circumsision    . Laminectomy N/A 07/13/2012    Procedure: THORACIC LAMINECTOMY FOR RESECTION OF INTRAMEDULLARY SPINAL CHORD TUMOR WITH SPINAL CHORD MONITORING;  Surgeon: Erline Levine, MD;  Location: [place] [facility];  Service: Neurosurgery;  Laterality: N/A;  Thoracic laminectomy for resection of intramedullary spinal cord tumor with spinal cord monitering and Dr. Christella Noa to assist  . Nm myoview ltd  3/212014    Lexiscan: LOW RISK, mild inferior bowel artifact; No ischemia or Infarction  . Eye surgery Bilateral  cataracts  . Tonsillectomy    . Laminectomy N/A 12/09/2014    Procedure: Redo Thoracic laminectomy with intramedullary tumar resection/USN/[place]/Subarachnoid shunt/spinal cord monitoring/Dr. Kathyrn Sheriff to assist;  Surgeon: Erline Levine, MD;  Location: [place] [facility];  Service: Neurosurgery;  Laterality: N/A;  Redo Thoracic laminectomy with intramedullary tumar resection/USN/[place]/Subarachnoid shunt/spinal cord monitoring/Dr. Kathyrn Sheriff to assist    There were no vitals filed for this visit.  Visit Diagnosis:  Abnormality of gait  Unsteadiness  Impaired sensation  Paraplegia (HCC)      Subjective Assessment - 03/24/15 1105    Subjective Pt denies falls and reports no significant changes since last session. 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 No/denies                Vestibular Assessment - 03/24/15 0001    Occulomotor Exam   Comment (+) L Head Thrust Test.                 [facility] Adult PT Treatment/Exercise - 03/24/15 0001    Ambulation/Gait   Ambulation/Gait Yes   Ambulation/Gait Assistance 5: Supervision   Ambulation Distance (Feet) 345 Feet   Assistive device None   Gait Pattern Step-through pattern;Decreased stride length;Decreased hip/knee flexion - right;Decreased hip/knee flexion - left;Right foot flat;Right flexed knee in stance;Decreased arm swing - right;Decreased arm swing - left  less prominent lateral trunk movement   Ambulation Surface Level;Indoor   Neuro Re-ed    Neuro Re-ed Details  Tall kneeling without UE support for increased proximal stability, improved trunk control, increased proprioceptive input/sensory awareness. In tall kneeling without UE support, pt performed forward/retro and B lateral stepping 2 x10 reps each with no overt LOB. Also performed modified squats x17 reps (to pt fatigue) without UE support.         Vestibular Treatment/Exercise - 03/24/15 0001    Vestibular Treatment/Exercise   Vestibular Treatment Provided Gaze   Gaze Exercises X1 Viewing Vertical;X1 Viewing Horizontal   X1 Viewing Horizontal   Foot Position standing   Comments x30 seconds   X1 Viewing Vertical   Foot Position standing   Comments x30 seconds            Balance Exercises - 03/24/15 1758    Balance Exercises: Standing   Standing Eyes Opened Wide (BOA);Head turns;Foam/compliant surface;4 reps;30 secs  x2 trials on pillow, x2 trials on Airex foam   Standing Eyes Closed Wide (BOA);Narrow  base of support (BOS);Head turns;Foam/compliant surface;Solid surface  x2 trials Airex; x2 trials foam beam; x2 narrow BOS solid   Wall Bumps Hip   Wall Bumps-Hips Eyes opened;Eyes closed;Anterior/posterior;Foam/compliant surface;10 reps;Other (comment)  Airex x10 EO, x5 EC; Foam beam x10 EO, x3 EC with min guard           PT Education - 03/24/15 1757    Education provided Yes   Education Details Explained rationale for gaze stabilization exercises. Decreased HEP to 2 exercises (see Pt Instructions) in attempt to increase pt compliance with HEP.   Person(s) Educated Patient   Methods Explanation;Demonstration;Verbal cues;Handout   Comprehension Verbalized understanding;Returned demonstration          PT Short  Term Goals - 03/17/15 1119    PT SHORT TERM GOAL #1   Title Pt will perform initial HEP with mod I using paper handout to maximize functional gains made in PT. Target date: 03/19/15   Baseline Met 12/9   Status Achieved   PT SHORT TERM GOAL #2   Title Pt will improve Berg score from 28 to 31/56 to indicate improved functional standing balance. Target date: 03/19/15   Baseline B12/23: Berg score = 37/56.   Status Achieved   PT SHORT TERM GOAL #3   Title Pt will negotiate 10 stairs with 2 rails and mod I to indicate pt safety accessing second floor of home. Target date: 03/19/15   Baseline Met 12/13.   Status Achieved   PT SHORT TERM GOAL #4   Title Pt will ambulate 200' over level, indoor surfaces with mod I and safe gait speed and appropriate use of AD. Target date: 03/19/15   Baseline Pt continues to require supervision at times for gait > 200' with SPC due to poor safety awareness, unsafe gait speed.   Status Partially Met   PT SHORT TERM GOAL #5   Title Further assess origin of dizziness, lightheadedness to prevent future falls. Target date: 03/19/15.   Status Achieved           PT Long Term Goals - 03/17/15 1134    PT LONG TERM GOAL #1   Title Pt will  verbalize understanding of fall prevention strategies in home environment to decrease risk of falls within home. Target date: 04/16/15   Baseline Discussed and provided handoutn on 12/13. Pt will require reinforcement.   Status On-going   PT LONG TERM GOAL #2   Title Pt will improve Berg score from 28 to 41/56 to indicate significant improvement in functional standing balance. Target date: 04/16/15   Baseline 12/13: REVISED from 34/56 to 41/56 due to pt progressing quickly.   Status Revised   PT LONG TERM GOAL #3   Title Pt will ambulte 1,000' over unlevel, paved surfaces with mod I using LRAD with good safety awareness and no overt LOB to indicate improved safety/independence with community mobility. Target date: 04/16/15   PT LONG TERM GOAL #4   Title Pt will negotiate standard ramp and curb step with mod I using LRAD to indicate pt ability to traverse community obstacles. Target date: 04/16/15   PT LONG TERM GOAL #5   Title Pt will verbalize ability to lead while dancing without loss of balance to enable pt to engage in leisure activities, improve QOL. Target date: 04/16/15               Plan - 03/24/15 1802    Clinical Impression Statement Session focused on dynamic staniding balance with decreased visual/somatosensory input. Noted positive L Head Thrust Test; therefore, unable to rule out L-sided vestibular hypodunction. Added VOR x1 viewing to HEP and decreased HEP to 2 total exercises to promote pt compliance with HEP.   Pt will benefit from skilled therapeutic intervention in order to improve on the following deficits Abnormal gait;Decreased knowledge of use of DME;Decreased balance;Decreased safety awareness;Decreased coordination;Decreased mobility;Decreased strength;Dizziness;Impaired perceived functional ability;Impaired sensation   Rehab Potential Good   Clinical Impairments Affecting Rehab Potential decreased safety awareness   PT Frequency 2x / week   PT Duration 8 weeks   PT  Treatment/Interventions ADLs/Self Care Home Management;Gait training;Stair training;Functional mobility training;Therapeutic activities;Therapeutic exercise;Balance training;Orthotic Fit/Training;Patient/family education;Manual techniques;Neuromuscular re-education;Vestibular;Canalith Repostioning;DME Instruction   PT Next Visit Plan **  GCODES. Check gaze stabilization exercise. Continue NMR for proximal stability/control, LE proprioceptive input, and grading of movement.   Consulted and Agree with Plan of Care Patient        Problem List Patient Active Problem List   Diagnosis Date Noted  . Diabetes type 2, uncontrolled ([place])   . Thoracic spine tumor 12/12/2014  . Neurogenic bowel 12/12/2014  . Incomplete paraplegia ([place]) 12/12/2014  . Back pain 12/09/2014  . Malignant tumor spinal cord ([place]) 12/09/2014  . Pre-operative cardiovascular examination 09/17/2014  . Other fatigue 09/17/2014  . Chest pain with moderate risk for cardiac etiology 09/17/2014  . 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 Unspecified hereditary and idiopathic peripheral neuropathy 11/01/2013  . Diastolic dysfunction without heart failure   . Obesity (BMI 30-39.9) 03/22/2013  . OSA on CPAP 03/19/2012  . H/O prostate cancer 03/19/2012  . Essential hypertension 03/19/2012  . Hyperlipidemia LDL goal <70 03/19/2012  . H/O hydrocele 03/19/2012  . Hx of melanoma excision 03/19/2012  . Personal history of colonic polyps 03/19/2012  . CAD S/P percutaneous coronary angioplasty: Cypher DES x2 to circumflex 08/17/2004    Billie Ruddy, PT, DPT [facility] [address] [place], [place], [address] Phone: [phone]   Fax:  [phone] 03/24/2015, 6:08 PM  Name: OTIS PORTAL MRN: [id number] Date of Birth: 03/23/1936

## 2015-03-24 NOTE — Patient Instructions (Addendum)
Gaze Stabilization: Tip Card  1.Target must remain in focus, not blurry, and appear stationary while head is in motion. 2.Perform exercises with small head movements (45 to either side of midline). 3.Increase speed of head motion so long as target is in focus. 4. No eyeglasses for this exercise. 5.These exercises may provoke dizziness or nausea. Work through these symptoms. If too dizzy, slow head movement slightly. Rest between each exercise. 6.Exercises demand concentration; avoid distractions. 7.For safety, perform standing exercises close to a counter, wall, corner, or next to someone.   Gaze Stabilization: Standing Feet Apart    Feet shoulder width apart, keeping eyes on target on wall __5-6"__ feet away, tilt head down 15-30 and move head side to side for __30__ seconds. Repeat while moving head up and down for _30___ seconds. Do __2_ sessions per day.   Balance: Eyes Closed while Standing on Pillow with Eyes Closed    Stand with your back to a corner with a stable chair in front of you. Stand on a pillow with eyes closed; try not to hold on unless you need to.  Hold for 30 seconds; 4 sets. Do this twice per day, every day.

## 2015-03-27 ENCOUNTER — Ambulatory Visit: Payer: Medicare Other | Admitting: Physical Therapy

## 2015-03-27 DIAGNOSIS — R269 Unspecified abnormalities of gait and mobility: Secondary | ICD-10-CM | POA: Diagnosis not present

## 2015-03-27 DIAGNOSIS — R201 Hypoesthesia of skin: Secondary | ICD-10-CM | POA: Diagnosis not present

## 2015-03-27 DIAGNOSIS — G8220 Paraplegia, unspecified: Secondary | ICD-10-CM | POA: Diagnosis not present

## 2015-03-27 DIAGNOSIS — R42 Dizziness and giddiness: Secondary | ICD-10-CM | POA: Diagnosis not present

## 2015-03-27 DIAGNOSIS — R2681 Unsteadiness on feet: Secondary | ICD-10-CM | POA: Diagnosis not present

## 2015-03-27 NOTE — Therapy (Signed)
[facility] [address] [place], [place], [address] Phone: [phone]   Fax:  [phone]  Physical Therapy Treatment  Patient Details  Name: [name] MRN: [id number] Date of Birth: 06-24-1935 Referring Provider: Alger Simons, MD  Encounter Date: 03/27/2015      PT End of Session - 03/27/15 1435    Visit Number 10   Number of Visits 17   Date for PT Re-Evaluation 04/20/15   Authorization Type Medicare - G Codes and PN every 10 visits   PT Start Time 1103   PT Stop Time 1148   PT Time Calculation (min) 45 min   Equipment Utilized During Treatment Gait belt   Activity Tolerance Patient tolerated treatment well   Behavior During Therapy [facility] for tasks assessed/performed      Past Medical History  Diagnosis Date  . History of prostate cancer 2004--  S/P SEED IMPLANTS     NO RECURRENCE  . S/P primary angioplasty with coronary stent 2006--      X2 DE STENTS - Cypher 2.5 mm postdilated to 2.75 mm (20 mm and 13 mm stents)  . Urgency of urination   . Frequency of urination   . H/O: gout STABLE  . Impaired hearing RIGHT HEARING AID  . Hydrocele, left   . History of melanoma excision 2010    FOREHEAD  . CAD S/P percutaneous coronary angioplasty CARDIOLOGIST-  DR DAVID [facility]    Myoview March 2014: No ischemia or infarction  . Diastolic dysfunction without heart failure     Moderate LVH, Gr 1 DD on Echo 2011; no CHF admissions, no tt on diuretisc  . Hyperlipidemia LDL goal <70   . Essential hypertension   . Colon polyps 05/10/2005    Hyperplastic polyps  . Depression   . OSA on CPAP     bipap  . Heart murmur     hx - no notable valvular lesion on Echo  . Cancer [facility])     prostate , melanoma head  . Arthritis   . Shingles 07  . Gout   . Complication of anesthesia     "takes long time to wake up"  . Diabetes mellitus type 2 with complications (HCC)     neuropathy; CAD borderline- no med     Past Surgical History  Procedure Laterality Date  . Prostate palladium gold seed implants (118)  11-15-2002    PROSTATE CANCER  . Excision melanoma from forehead  2010  . Coronary angioplasty with stent placement  09-01-2004      Cypher DES x 2 d-m LCx 2.5 mm x 20 mm & 2.5 mm x 13 mm (~2.75 mm)  . Transthoracic echocardiogram  MAR 32,6712    NORMAL EF > 55% / NORMAL FUNCTION WITH IMPAIRED RELAXATION AND MODERATE CONCENTRIC LVH LIKELY HYPERTENSIVE DISEASE  . Hydrocele excision  10/14/2011    Procedure: HYDROCELECTOMY ADULT;  Surgeon: Ailene Rud, MD;  [facility]: [facility];  Service: Urology;  Laterality: Left;  . Circumsision    . Laminectomy N/A 07/13/2012    Procedure: THORACIC LAMINECTOMY FOR RESECTION OF INTRAMEDULLARY SPINAL CHORD TUMOR WITH SPINAL CHORD MONITORING;  Surgeon: Erline Levine, MD;  Location: [facility];  Service: Neurosurgery;  Laterality: N/A;  Thoracic laminectomy for resection of intramedullary spinal cord tumor with spinal cord monitering and Dr. Christella Noa to assist  . Nm myoview ltd  [id number]/212014    Lexiscan: LOW RISK, mild inferior bowel artifact; No ischemia or Infarction  .  Eye surgery Bilateral     cataracts  . Tonsillectomy    . Laminectomy N/A 12/09/2014    Procedure: Redo Thoracic laminectomy with intramedullary tumar resection/USN/[place]/Subarachnoid shunt/spinal cord monitoring/Dr. Kathyrn Sheriff to assist;  Surgeon: Erline Levine, MD;  Location: [facility];  Service: Neurosurgery;  Laterality: N/A;  Redo Thoracic laminectomy with intramedullary tumar resection/USN/[place]/Subarachnoid shunt/spinal cord monitoring/Dr. Kathyrn Sheriff to assist    There were no vitals filed for this visit.  Visit Diagnosis:  Abnormality of gait  Unsteadiness  Impaired sensation  Paraplegia (HCC)      Subjective Assessment - 03/27/15 1105    Subjective "I'm going on a cruise and won't be back until the 10th of January."  Pt feels as though he is more stable  with walking since beginning PT.  Pt reports inconsisent HEP compliance. Pt denies falls, reports no significant changes.    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 No/denies            [facility] PT Assessment - 03/27/15 0001    Berg Balance Test   Sit to Stand Able to stand without using hands and stabilize independently   Standing Unsupported Able to stand safely 2 minutes   Sitting with Back Unsupported but Feet Supported on Floor or Stool Able to sit safely and securely 2 minutes   Stand to Sit Sits safely with minimal use of hands   Transfers Able to transfer safely, minor use of hands   Standing Unsupported with Eyes Closed Able to stand 10 seconds with supervision   Standing Ubsupported with Feet Together Able to place feet together independently and stand 1 minute safely   From Standing, Reach Forward with Outstretched Arm Can reach confidently >25 cm (10")   From Standing Position, Pick up Object from Floor Able to pick up shoe, needs supervision   From Standing Position, Turn to Look Behind Over each Shoulder Looks behind one side only/other side shows less weight shift   Turn 360 Degrees Able to turn 360 degrees safely but slowly   Standing Unsupported, Alternately Place Feet on Step/Stool Able to stand independently and safely and complete 8 steps in 20 seconds   Standing Unsupported, One Foot in Front Able to plae foot ahead of the other independently and hold 30 seconds   Standing on One Leg Unable to try or needs assist to prevent fall   Total Score 46                     [facility] Adult PT Treatment/Exercise - 03/27/15 0001    Ambulation/Gait   Ambulation/Gait Yes   Ambulation/Gait  Assistance 6: Modified independent (Device/Increase time);5: Supervision   Ambulation/Gait Assistance Details Mod I for gait x325' over level;, indoor surfaces without AD. Supervision to min guard for ambulation oer unlevel, outdoor surfaces without AD. Single LOB (with effective self-recovery) when pt turned head to speak to PT while ambulating outdoors.   Ambulation Distance (Feet) 1325 Feet   Assistive device None   Gait Pattern Step-through pattern;Decreased stride length;Right foot flat;Right flexed knee in stance;Decreased arm swing - right;Decreased arm swing - left;Left foot flat  less prominent lateral trunk movement   Ambulation Surface Level;Unlevel;Indoor;Outdoor;Paved   Ramp 5:  Supervision   Ramp Details (indicate cue type and reason) without AD         Vestibular Treatment/Exercise - 03/27/15 0001    Vestibular Treatment/Exercise   Vestibular Treatment Provided Gaze   Gaze Exercises X1 Viewing Vertical;X1 Viewing Horizontal   X1 Viewing Horizontal   Foot Position standing   Comments x30 seconds  Required mod cueing for effective return demo   X1 Viewing Vertical   Foot Position standing   Comments x30 seconds  Required mod cueing for effective return demo            Balance Exercises - 03/27/15 1433    Balance Exercises: Standing   Standing Eyes Closed Wide (BOA);Foam/compliant surface;Other (comment)  mod cueing for effective return demo           PT Education - 03/27/15 1431    Education provided --          PT Short Term Goals - 03/17/15 1119    PT SHORT TERM GOAL #1   Title Pt will perform initial HEP with mod I using paper handout to maximize functional gains made in PT. Target date: 03/19/15   Baseline Met 12/9   Status Achieved   PT SHORT TERM GOAL #2   Title Pt will improve Berg score from 28 to 31/56 to indicate improved functional standing balance. Target date: 03/19/15   Baseline B12/23: Berg score = 37/56.   Status Achieved   PT  SHORT TERM GOAL #3   Title Pt will negotiate 10 stairs with 2 rails and mod I to indicate pt safety accessing second floor of home. Target date: 03/19/15   Baseline Met 12/13.   Status Achieved   PT SHORT TERM GOAL #4   Title Pt will ambulate 200' over level, indoor surfaces with mod I and safe gait speed and appropriate use of AD. Target date: 03/19/15   Baseline Pt continues to require supervision at times for gait > 200' with SPC due to poor safety awareness, unsafe gait speed.   Status Partially Met   PT SHORT TERM GOAL #5   Title Further assess origin of dizziness, lightheadedness to prevent future falls. Target date: 03/19/15.   Status Achieved           PT Long Term Goals - 03/27/15 1123    PT LONG TERM GOAL #1   Title Pt will verbalize understanding of fall prevention strategies in home environment to decrease risk of falls within home. Target date: 04/16/15   Baseline Discussed and provided handoutn on 12/13. Pt will require reinforcement.   Status On-going   PT LONG TERM GOAL #2   Title Pt will improve Berg score from 28 to 41/56 to indicate significant improvement in functional standing balance. Target date: 04/16/15   Baseline Met 12/23 with Berg score of 46/56   Status Achieved   PT LONG TERM GOAL #3   Title Pt will ambulte 1,000' over unlevel, paved surfaces with mod I using LRAD with good safety awareness and no overt LOB to indicate improved safety/independence with community mobility. Target date: 04/16/15   PT LONG TERM GOAL #4   Title Pt will negotiate standard ramp and curb step with mod I using LRAD to indicate pt ability to traverse community obstacles. Target date: 04/16/15   PT LONG TERM GOAL #5   Title Pt will verbalize ability to lead while dancing without loss of balance to enable pt to engage in leisure activities, improve QOL. Target date: 04/16/15  Plan - 04-05-2015 1436    Clinical Impression Statement Skilled session focused on  assessing functional standing balance; gait training without AD. Pt scored 46/56 on Berg (improved from 28/56 on PT evaluation), suggesting significant improvement in functional standing balance and decreased risk of falling. Pt is now safe to ambulate in home without assistive device. Strongly recommending pt continue to use [facility] for all mobility outside the home. Pt verbalized understanding and was in full agreement.    Pt will benefit from skilled therapeutic intervention in order to improve on the following deficits Abnormal gait;Decreased knowledge of use of DME;Decreased balance;Decreased safety awareness;Decreased coordination;Decreased mobility;Decreased strength;Dizziness;Impaired perceived functional ability;Impaired sensation   Rehab Potential Good   Clinical Impairments Affecting Rehab Potential decreased safety awareness   PT Frequency 2x / week   PT Duration 8 weeks   PT Treatment/Interventions ADLs/Self Care Home Management;Gait training;Stair training;Functional mobility training;Therapeutic activities;Therapeutic exercise;Balance training;Orthotic Fit/Training;Patient/family education;Manual techniques;Neuromuscular re-education;Vestibular;Canalith Repostioning;DME Instruction   PT Next Visit Plan ** Check LTG's and tentatively plan to DC. Pt not planning to return to PT until 04/14/15 (after end of original POC).    Consulted and Agree with Plan of Care Patient          G-Codes - 04/05/15 1126    Functional Assessment Tool Used Merrilee Jansky 46/56   Functional Limitation Mobility: Walking and moving around   Mobility: Walking and Moving Around Current Status [phone]) At least 20 percent but less than 40 percent impaired, limited or restricted   Mobility: Walking and Moving Around Goal Status [phone]) At least 1 percent but less than 20 percent impaired, limited or restricted     Physical Therapy Progress Note  Dates of Reporting Period: 02/19/15 to 04/05/2015  Objective Reports of  Subjective Statement: See Subjective section above.  Objective Measurements:  Berg 46/56.   Goal Update: See above.  Plan: Pt will continue to benefit from skilled outpatient PT to address goals originally established.  Reason Skilled Services are Required: To improve stability/independence with functional mobility/ambulation; increase standing balance; improve ability to utilize vestibular input for balance (due to proprioceptive/sensory impairments); decrease risk of falling; and to progress toward return to PLOF.      Problem List Patient Active Problem List   Diagnosis Date Noted  . Diabetes type 2, uncontrolled ([place])   . Thoracic spine tumor 12/12/2014  . Neurogenic bowel 12/12/2014  . Incomplete paraplegia ([place]) 12/12/2014  . Back pain 12/09/2014  . Malignant tumor spinal cord ([place]) 12/09/2014  . Pre-operative cardiovascular examination 09/17/2014  . Other fatigue 09/17/2014  . Chest pain with moderate risk for cardiac etiology 09/17/2014  . Ependymoma of spinal cord ([place]) 11/01/2013  . Unspecified hereditary and idiopathic peripheral neuropathy 11/01/2013  . Diastolic dysfunction without heart failure   . Obesity (BMI 30-39.9) 03/22/2013  . OSA on CPAP 03/19/2012  . H/O prostate cancer 03/19/2012  . Essential hypertension 03/19/2012  . Hyperlipidemia LDL goal <70 03/19/2012  . H/O hydrocele 03/19/2012  . Hx of melanoma excision 03/19/2012  . Personal history of colonic polyps 03/19/2012  . CAD S/P percutaneous coronary angioplasty: Cypher DES x2 to circumflex 08/17/2004   Billie Ruddy, PT, DPT [facility] [address] [place], [place], [address] Phone: [phone]   Fax:  [phone] 2015-04-05, 2:42 PM  Name: ORLA JOLLIFF MRN: [id number] Date of Birth: Aug 06, 1935

## 2015-03-31 ENCOUNTER — Ambulatory Visit: Payer: Medicare Other | Admitting: Physical Therapy

## 2015-04-03 ENCOUNTER — Ambulatory Visit: Payer: Medicare Other | Admitting: Physical Therapy

## 2015-04-07 ENCOUNTER — Ambulatory Visit: Payer: Medicare Other | Admitting: Rehabilitation

## 2015-04-10 ENCOUNTER — Ambulatory Visit: Payer: Medicare Other | Admitting: Rehabilitation

## 2015-04-14 ENCOUNTER — Ambulatory Visit: Payer: Medicare Other | Attending: Neurosurgery | Admitting: Physical Therapy

## 2015-04-14 DIAGNOSIS — R269 Unspecified abnormalities of gait and mobility: Secondary | ICD-10-CM | POA: Diagnosis not present

## 2015-04-14 DIAGNOSIS — R201 Hypoesthesia of skin: Secondary | ICD-10-CM | POA: Diagnosis not present

## 2015-04-14 DIAGNOSIS — R2681 Unsteadiness on feet: Secondary | ICD-10-CM | POA: Diagnosis not present

## 2015-04-14 NOTE — Therapy (Signed)
[facility] [address] [place], [place], [address] Phone: [phone]   Fax:  [phone]  Physical Therapy Treatment  Patient Details  Name: [name] MRN: [id number] Date of Birth: 04/01/1936 Referring Provider: Alger Simons, MD  Encounter Date: 04/14/2015      PT End of Session - 04/14/15 1733    Visit Number 11   Number of Visits 17   Date for PT Re-Evaluation 04/20/15   Authorization Type Medicare - G Codes and PN every 10 visits   PT Start Time 1100   PT Stop Time 1146   PT Time Calculation (min) 46 min   Equipment Utilized During Treatment Gait belt;Other (comment)  Balance Master and harness   Activity Tolerance Patient tolerated treatment well   Behavior During Therapy [facility] for tasks assessed/performed      Past Medical History  Diagnosis Date  . History of prostate cancer 2004--  S/P SEED IMPLANTS     NO RECURRENCE  . S/P primary angioplasty with coronary stent 2006--      X2 DE STENTS - Cypher 2.5 mm postdilated to 2.75 mm (20 mm and 13 mm stents)  . Urgency of urination   . Frequency of urination   . H/O: gout STABLE  . Impaired hearing RIGHT HEARING AID  . Hydrocele, left   . History of melanoma excision 2010    FOREHEAD  . CAD S/P percutaneous coronary angioplasty CARDIOLOGIST-  DR DAVID [facility]    Myoview March 2014: No ischemia or infarction  . Diastolic dysfunction without heart failure     Moderate LVH, Gr 1 DD on Echo 2011; no CHF admissions, no tt on diuretisc  . Hyperlipidemia LDL goal <70   . Essential hypertension   . Colon polyps 05/10/2005    Hyperplastic polyps  . Depression   . OSA on CPAP     bipap  . Heart murmur     hx - no notable valvular lesion on Echo  . Cancer [facility])     prostate , melanoma head  . Arthritis   . Shingles 07  . Gout   . Complication of anesthesia     "takes long time to wake up"  . Diabetes mellitus type 2 with complications (HCC)    neuropathy; CAD borderline- no med    Past Surgical History  Procedure Laterality Date  . Prostate palladium gold seed implants (118)  11-15-2002    PROSTATE CANCER  . Excision melanoma from forehead  2010  . Coronary angioplasty with stent placement  09-01-2004      Cypher DES x 2 d-m LCx 2.5 mm x 20 mm & 2.5 mm x 13 mm (~2.75 mm)  . Transthoracic echocardiogram  MAR 71,6967    NORMAL EF > 55% / NORMAL FUNCTION WITH IMPAIRED RELAXATION AND MODERATE CONCENTRIC LVH LIKELY HYPERTENSIVE DISEASE  . Hydrocele excision  10/14/2011    Procedure: HYDROCELECTOMY ADULT;  Surgeon: Ailene Rud, MD;  Location: [facility];  Service: Urology;  Laterality: Left;  . Circumsision    . Laminectomy N/A 07/13/2012    Procedure: THORACIC LAMINECTOMY FOR RESECTION OF INTRAMEDULLARY SPINAL CHORD TUMOR WITH SPINAL CHORD MONITORING;  Surgeon: Erline Levine, MD;  Location: [place] NEURO ORS;  Service: Neurosurgery;  Laterality: N/A;  Thoracic laminectomy for resection of intramedullary spinal cord tumor with spinal cord monitering and Dr. Christella Noa to assist  . Nm myoview ltd  3/212014    Lexiscan: LOW RISK, mild inferior bowel artifact;  No ischemia or Infarction  . Eye surgery Bilateral     cataracts  . Tonsillectomy    . Laminectomy N/A 12/09/2014    Procedure: Redo Thoracic laminectomy with intramedullary tumar resection/USN/Cusa/Subarachnoid shunt/spinal cord monitoring/Dr. Kathyrn Sheriff to assist;  Surgeon: Erline Levine, MD;  Location: [place] NEURO ORS;  Service: Neurosurgery;  Laterality: N/A;  Redo Thoracic laminectomy with intramedullary tumar resection/USN/Cusa/Subarachnoid shunt/spinal cord monitoring/Dr. Kathyrn Sheriff to assist    There were no vitals filed for this visit.  Visit Diagnosis:  Abnormality of gait  Unsteadiness  Impaired sensation      Subjective Assessment - 04/14/15 1105    Subjective "I've been sick. I had bronchitis. 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 I  didn't exercise much. 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 No/denies                Vestibular Assessment - 04/14/15 0001    Balancemaster   Therapist, occupational Comment SOT composite score = 52%, which is lower than age-matched norm of 70%. Also noted abnormal ability to utilize somatosensory and vestibular input for balance. 4 "falls" sustained during conditions 2, 5, and 6.                  [place] Adult PT Treatment/Exercise - 04/14/15 0001    Ambulation/Gait   Ambulation/Gait Yes   Ambulation/Gait Assistance 5: Supervision;4: Min guard   Ambulation Distance (Feet) 600 Feet   Assistive device None   Gait Pattern Step-through pattern;Decreased stride length;Right foot flat;Right flexed knee in stance;Decreased arm swing - right;Decreased arm swing - left;Left foot flat  less prominent lateral trunk movement   Ambulation Surface Level;Indoor   Ramp 5: Supervision   Ramp Details (indicate cue type and reason) without AD   Curb 4: Min assist   Curb Details (indicate cue type and reason) without AD; min A required secondary to posterior LOB.         Vestibular Treatment/Exercise - 04/14/15 0001    Vestibular Treatment/Exercise   Vestibular Treatment Provided Gaze   Gaze Exercises X1 Viewing Horizontal;X1 Viewing Vertical   X1 Viewing Horizontal   Foot Position standing   Comments x30 sec  reviewed exercise; min cueing required   X1 Viewing Vertical   Foot Position standing   Comments x30 sec  reviewed exercise; no cueing required               PT  Education - 04/14/15 1639    Education provided Yes   Education Details SOT findings, functional implications with focus on vestibular impairments. Reiterated importance of daily performance of vestib. exercises at home.   Person(s) Educated Patient   Methods Explanation;Demonstration;Verbal cues;Handout   Comprehension Verbalized understanding;Returned demonstration          PT Short Term Goals - 03/17/15 1119    PT SHORT TERM GOAL #1   Title Pt will perform initial HEP with mod I using paper handout to maximize functional gains made in PT. Target date: 03/19/15   Baseline Met 12/9   Status Achieved   PT  SHORT TERM GOAL #2   Title Pt will improve Berg score from 28 to 31/56 to indicate improved functional standing balance. Target date: 03/19/15   Baseline B12/23: Berg score = 37/56.   Status Achieved   PT SHORT TERM GOAL #3   Title Pt will negotiate 10 stairs with 2 rails and mod I to indicate pt safety accessing second floor of home. Target date: 03/19/15   Baseline Met 12/13.   Status Achieved   PT SHORT TERM GOAL #4   Title Pt will ambulate 200' over level, indoor surfaces with mod I and safe gait speed and appropriate use of AD. Target date: 03/19/15   Baseline Pt continues to require supervision at times for gait > 200' with SPC due to poor safety awareness, unsafe gait speed.   Status Partially Met   PT SHORT TERM GOAL #5   Title Further assess origin of dizziness, lightheadedness to prevent future falls. Target date: 03/19/15.   Status Achieved           PT Long Term Goals - 04/14/15 1107    PT LONG TERM GOAL #1   Title Pt will verbalize understanding of fall prevention strategies in home environment to decrease risk of falls within home. Target date: 04/16/15   Baseline 1/10: pt able to recall theme of creating multisensory environment.    Status Partially Met   PT LONG TERM GOAL #2   Title Pt will improve Berg score from 28 to 41/56 to indicate significant  improvement in functional standing balance. Target date: 04/16/15   Baseline Met 12/23 with Berg score of 46/56   Status Achieved   PT LONG TERM GOAL #3   Title Pt will ambulte 1,000' over unlevel, paved surfaces with mod I using LRAD with good safety awareness and no overt LOB to indicate improved safety/independence with community mobility. Target date: 04/16/15   PT LONG TERM GOAL #4   Title Pt will negotiate standard ramp and curb step with mod I using LRAD to indicate pt ability to traverse community obstacles. Target date: 04/16/15   PT LONG TERM GOAL #5   Title Pt will verbalize ability to lead while dancing without loss of balance to enable pt to engage in leisure activities, improve QOL. Target date: 04/16/15               Plan - 04/14/15 1734    Clinical Impression Statement Completed SOT with composite score of 52%, whcih is below age-matched norm of 70%. SOT findings indicated marked impairment in pt ability to use vestibular and somatosensory input for balance. Reiterated education on importance of performing vestibular HEP every day to maximize functional gains. Plan to discharge at upcoming end of POC.   Pt will benefit from skilled therapeutic intervention in order to improve on the following deficits Abnormal gait;Decreased knowledge of use of DME;Decreased balance;Decreased safety awareness;Decreased coordination;Decreased mobility;Decreased strength;Dizziness;Impaired perceived functional ability;Impaired sensation   Rehab Potential Good   Clinical Impairments Affecting Rehab Potential decreased safety awareness   PT Frequency 2x / week   PT Duration 8 weeks   PT Treatment/Interventions ADLs/Self Care Home Management;Gait training;Stair training;Functional mobility training;Therapeutic activities;Therapeutic exercise;Balance training;Orthotic Fit/Training;Patient/family education;Manual techniques;Neuromuscular re-education;Vestibular;Canalith Repostioning;DME Instruction    PT Next Visit Plan Continue checking LTG's.   Consulted and Agree with Plan of Care Patient        Problem List Patient Active Problem List   Diagnosis Date Noted  . Diabetes type 2, uncontrolled ([place])   . Thoracic spine  tumor 12/12/2014  . Neurogenic bowel 12/12/2014  . Incomplete paraplegia ([place]) 12/12/2014  . Back pain 12/09/2014  . Malignant tumor spinal cord ([place]) 12/09/2014  . Pre-operative cardiovascular examination 09/17/2014  . Other fatigue 09/17/2014  . Chest pain with moderate risk for cardiac etiology 09/17/2014  . Ependymoma of spinal cord ([place]) 11/01/2013  . Unspecified hereditary and idiopathic peripheral neuropathy 11/01/2013  . Diastolic dysfunction without heart failure   . Obesity (BMI 30-39.9) 03/22/2013  . OSA on CPAP 03/19/2012  . H/O prostate cancer 03/19/2012  . Essential hypertension 03/19/2012  . Hyperlipidemia LDL goal <70 03/19/2012  . H/O hydrocele 03/19/2012  . Hx of melanoma excision 03/19/2012  . Personal history of colonic polyps 03/19/2012  . CAD S/P percutaneous coronary angioplasty: Cypher DES x2 to circumflex 08/17/2004    Billie Ruddy, PT, DPT [facility] [address] [place], [place], [address] Phone: [phone]   Fax:  [phone] 04/14/2015, 5:38 PM  Name: [name] MRN: [id number] Date of Birth: 1936-03-31

## 2015-04-15 ENCOUNTER — Encounter: Payer: Medicare Other | Attending: Physical Medicine & Rehabilitation | Admitting: Physical Medicine & Rehabilitation

## 2015-04-15 ENCOUNTER — Encounter: Payer: Self-pay | Admitting: Physical Medicine & Rehabilitation

## 2015-04-15 VITALS — BP 138/60 | HR 68 | Resp 14

## 2015-04-15 DIAGNOSIS — Z79899 Other long term (current) drug therapy: Secondary | ICD-10-CM | POA: Diagnosis not present

## 2015-04-15 DIAGNOSIS — K5900 Constipation, unspecified: Secondary | ICD-10-CM | POA: Insufficient documentation

## 2015-04-15 DIAGNOSIS — E785 Hyperlipidemia, unspecified: Secondary | ICD-10-CM | POA: Insufficient documentation

## 2015-04-15 DIAGNOSIS — N400 Enlarged prostate without lower urinary tract symptoms: Secondary | ICD-10-CM | POA: Insufficient documentation

## 2015-04-15 DIAGNOSIS — C720 Malignant neoplasm of spinal cord: Secondary | ICD-10-CM | POA: Diagnosis not present

## 2015-04-15 DIAGNOSIS — K592 Neurogenic bowel, not elsewhere classified: Secondary | ICD-10-CM | POA: Insufficient documentation

## 2015-04-15 DIAGNOSIS — G4733 Obstructive sleep apnea (adult) (pediatric): Secondary | ICD-10-CM | POA: Diagnosis not present

## 2015-04-15 DIAGNOSIS — G8222 Paraplegia, incomplete: Secondary | ICD-10-CM

## 2015-04-15 DIAGNOSIS — M109 Gout, unspecified: Secondary | ICD-10-CM | POA: Insufficient documentation

## 2015-04-15 DIAGNOSIS — Z955 Presence of coronary angioplasty implant and graft: Secondary | ICD-10-CM | POA: Diagnosis not present

## 2015-04-15 DIAGNOSIS — I2510 Atherosclerotic heart disease of native coronary artery without angina pectoris: Secondary | ICD-10-CM | POA: Insufficient documentation

## 2015-04-15 DIAGNOSIS — R011 Cardiac murmur, unspecified: Secondary | ICD-10-CM | POA: Insufficient documentation

## 2015-04-15 DIAGNOSIS — E1140 Type 2 diabetes mellitus with diabetic neuropathy, unspecified: Secondary | ICD-10-CM | POA: Diagnosis not present

## 2015-04-15 DIAGNOSIS — I10 Essential (primary) hypertension: Secondary | ICD-10-CM | POA: Insufficient documentation

## 2015-04-15 DIAGNOSIS — C719 Malignant neoplasm of brain, unspecified: Secondary | ICD-10-CM | POA: Insufficient documentation

## 2015-04-15 DIAGNOSIS — Z85820 Personal history of malignant melanoma of skin: Secondary | ICD-10-CM | POA: Diagnosis not present

## 2015-04-15 DIAGNOSIS — N401 Enlarged prostate with lower urinary tract symptoms: Secondary | ICD-10-CM | POA: Insufficient documentation

## 2015-04-15 DIAGNOSIS — F329 Major depressive disorder, single episode, unspecified: Secondary | ICD-10-CM | POA: Diagnosis not present

## 2015-04-15 DIAGNOSIS — R319 Hematuria, unspecified: Secondary | ICD-10-CM | POA: Diagnosis not present

## 2015-04-15 DIAGNOSIS — G8220 Paraplegia, unspecified: Secondary | ICD-10-CM | POA: Insufficient documentation

## 2015-04-15 DIAGNOSIS — G609 Hereditary and idiopathic neuropathy, unspecified: Secondary | ICD-10-CM

## 2015-04-15 DIAGNOSIS — Z8546 Personal history of malignant neoplasm of prostate: Secondary | ICD-10-CM | POA: Insufficient documentation

## 2015-04-15 NOTE — Patient Instructions (Signed)
  PLEASE CALL ME WITH ANY PROBLEMS OR QUESTIONS (#[phone]).      

## 2015-04-15 NOTE — Progress Notes (Signed)
Subjective:    Patient ID: [name], male    DOB: 1935/04/11, 80 y.o.   MRN: [id number]  HPI   [name] is here in follow up of his thoracic ependymoma. He has tried some driving with his but he has had struggles due to his sensory deficits. He is using a cane for balance. He is finishing up therapy over the next couple weeks. He hasn't had any falls.  [name] does feel his numbness is worsening in his legs. His strength is good. He is doing better with his glucose control but sugars still appear to be elevated.    Pain Inventory Average Pain 0 Pain Right Now 0 My pain is no pain  In the last 24 hours, has pain interfered with the following? General activity 0 Relation with others 0 Enjoyment of life 0 What TIME of day is your pain at its worst? no pain Sleep (in general) Fair  Pain is worse with: no pain Pain improves with: no pain Relief from Meds: no pain  Mobility walk with assistance use a cane ability to climb steps?  yes do you drive?  no Do you have any goals in this area?  yes  Function employed # of hrs/week 25  Neuro/Psych weakness numbness trouble walking depression  Prior Studies Any changes since last visit?  no  Physicians involved in your care Any changes since last visit?  no   Family History  Problem Relation Age of Onset  . Hypertension Mother   . Stroke Mother   . Hypertension Father   . Prostate cancer Father   . Hypertension Brother   . Prostate cancer Brother    Social History   Social History  . Marital Status: Married    Spouse Name: N/A  . Number of Children: N/A  . Years of Education: N/A   Social History Main Topics  . Smoking status: Never Smoker   . Smokeless tobacco: Never Used     Comment: occ alcohol  . Alcohol Use: Yes     Comment: OCCASIONAL  . Drug Use: No  . Sexual Activity: Not Asked   Other Topics Concern  . None   Social History Narrative   He is married, father of 2, grandfather of  61. Does not really get exercise now because   of his back issues. Does not smoke and drinks social alcohol. Works in [employer].   Past Surgical History  Procedure Laterality Date  . Prostate palladium gold seed implants (118)  11-15-2002    PROSTATE CANCER  . Excision melanoma from forehead  2010  . Coronary angioplasty with stent placement  09-01-2004      Cypher DES x 2 d-m LCx 2.5 mm x 20 mm & 2.5 mm x 13 mm (~2.75 mm)  . Transthoracic echocardiogram  MAR [id number]    NORMAL EF > 55% / NORMAL FUNCTION WITH IMPAIRED RELAXATION AND MODERATE CONCENTRIC LVH LIKELY HYPERTENSIVE DISEASE  . Hydrocele excision  10/14/2011    Procedure: HYDROCELECTOMY ADULT;  Surgeon: Ailene Rud, MD;  Location: [facility];  Service: Urology;  Laterality: Left;  . Circumsision    . Laminectomy N/A 07/13/2012    Procedure: THORACIC LAMINECTOMY FOR RESECTION OF INTRAMEDULLARY SPINAL CHORD TUMOR WITH SPINAL CHORD MONITORING;  Surgeon: Erline Levine, MD;  Location: [place] NEURO ORS;  Service: Neurosurgery;  Laterality: N/A;  Thoracic laminectomy for resection of intramedullary spinal cord tumor with spinal cord monitering and Dr. Christella Noa to assist  .  Nm myoview ltd  3/212014    Lexiscan: LOW RISK, mild inferior bowel artifact; No ischemia or Infarction  . Eye surgery Bilateral     cataracts  . Tonsillectomy    . Laminectomy N/A 12/09/2014    Procedure: Redo Thoracic laminectomy with intramedullary tumar resection/USN/Cusa/Subarachnoid shunt/spinal cord monitoring/Dr. Kathyrn Sheriff to assist;  Surgeon: Erline Levine, MD;  Location: [place] NEURO ORS;  Service: Neurosurgery;  Laterality: N/A;  Redo Thoracic laminectomy with intramedullary tumar resection/USN/Cusa/Subarachnoid shunt/spinal cord monitoring/Dr. Kathyrn Sheriff to assist   Past Medical History  Diagnosis Date  . History of prostate cancer 2004--  S/P SEED IMPLANTS     NO RECURRENCE  . S/P primary angioplasty with coronary stent 2006--      X2 DE  STENTS - Cypher 2.5 mm postdilated to 2.75 mm (20 mm and 13 mm stents)  . Urgency of urination   . Frequency of urination   . H/O: gout STABLE  . Impaired hearing RIGHT HEARING AID  . Hydrocele, left   . History of melanoma excision 2010    FOREHEAD  . CAD S/P percutaneous coronary angioplasty CARDIOLOGIST-  DR DAVID [facility]    Myoview March 2014: No ischemia or infarction  . Diastolic dysfunction without heart failure     Moderate LVH, Gr 1 DD on Echo 2011; no CHF admissions, no tt on diuretisc  . Hyperlipidemia LDL goal <70   . Essential hypertension   . Colon polyps 05/10/2005    Hyperplastic polyps  . Depression   . OSA on CPAP     bipap  . Heart murmur     hx - no notable valvular lesion on Echo  . Cancer [facility])     prostate , melanoma head  . Arthritis   . Shingles 07  . Gout   . Complication of anesthesia     "takes long time to wake up"  . Diabetes mellitus type 2 with complications (HCC)     neuropathy; CAD borderline- no med   BP 138/60 mmHg  Pulse 68  Resp 14  SpO2 97%  Opioid Risk Score:   Fall Risk Score:  `1  Depression screen PHQ 2/9  Depression screen [facility] 2/9 02/03/2015 08/18/2013  Decreased Interest 0 0  Down, Depressed, Hopeless 1 0  PHQ - 2 Score 1 0     Review of Systems     Objective:   Physical Exam  Constitutional: He is oriented to person, place, and time. He appears well-developed.  HENT: oral mucosa pink and moist  Head: Normocephalic.  Eyes: EOM are normal.  Neck: Normal range of motion. Neck supple. No thyromegaly present.  Cardiovascular: Normal rate and regular rhythm. rare pac if any today  Respiratory: Effort normal and breath sounds normal. No respiratory distress. No wheezes or rales  GI: Soft. Bowel sounds are normal. He exhibits no distension.  Neurological: He is alert and oriented to person, place, and time.  Continued diminished LT/PP in bilateral lower extremities from upper thigh and distally. No symptoms in hands.   Decreased gross coordination of both legs, right more than left but improved.. RLE, : 4/5 hf, 4ke and 4- adf/apf. LLE: 4/5 hf, 4ke and 4-adf/apf. Bilateral UE: 5/5 deltoid, bicep, tricep, wrist, HI. Cognitively he's appropriate with normal insight and awareness. he remains a bit impulsive at [facility] very fast once again.Marland Kitchen   Psychiatric: He has a normal mood and affect. His behavior is normal.. Generally playful at times.  Assessment/Plan:  1. 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  -improved mobility---needs to SLOW DOWN--reviewed his impulsivity once again  -contact info was given regarding hand control equipment Demetrius Revel) -may try driving as practice with wife only 2 Pain Management: off all pain meds.  4. Mood: Lexapro 10 mg daily to continue  5 Hypertension. Per primary  9. Constipation/neurogenic bowel: continue bowel program  10. Hyperlipidemia. Crestor  11. History of gout. Allopurinol 300 mg daily. no flareups   12. BPH with history of prostate cancer/hematuria  -emptying fairly frequently near baseline  13. CAD-per cadiology  14. DM--ongoing glucose monitoring and better diet control is needed.   -I suspect his sensory loss is largely associated with his diabetes.   -reviewed the importance of improved glycemic control  Follow up with me in about 6 months. Thirty minutes of face to face patient care time were spent during this visit. All questions were encouraged and answered.

## 2015-04-17 ENCOUNTER — Ambulatory Visit: Payer: Medicare Other | Admitting: Physical Therapy

## 2015-04-17 DIAGNOSIS — R201 Hypoesthesia of skin: Secondary | ICD-10-CM

## 2015-04-17 DIAGNOSIS — R269 Unspecified abnormalities of gait and mobility: Secondary | ICD-10-CM | POA: Diagnosis not present

## 2015-04-17 DIAGNOSIS — R2681 Unsteadiness on feet: Secondary | ICD-10-CM

## 2015-04-18 NOTE — Therapy (Signed)
[facility] [address] [place], [place], [address] Phone: [phone]   Fax:  [phone]  Physical Therapy Treatment  Patient Details  Name: [name] MRN: [id number] Date of Birth: [birth year] Referring Provider: Alger Simons, MD  Encounter Date: 04/17/2015      PT End of Session - 04/18/15 1308    Visit Number 12   Number of Visits 17   Date for PT Re-Evaluation 04/20/15   Authorization Type Medicare - G Codes and PN every 10 visits   PT Start Time 1107   PT Stop Time 1126  Ended early due to pt discharge   PT Time Calculation (min) 19 min   Equipment Utilized During Treatment Other (comment)   Activity Tolerance Patient tolerated treatment well   Behavior During Therapy [facility] for tasks assessed/performed      Past Medical History  Diagnosis Date  . History of prostate cancer 2004--  S/P SEED IMPLANTS     NO RECURRENCE  . S/P primary angioplasty with coronary stent 2006--      X2 DE STENTS - Cypher 2.5 mm postdilated to 2.75 mm (20 mm and 13 mm stents)  . Urgency of urination   . Frequency of urination   . H/O: gout STABLE  . Impaired hearing RIGHT HEARING AID  . Hydrocele, left   . History of melanoma excision 2010    FOREHEAD  . CAD S/P percutaneous coronary angioplasty CARDIOLOGIST-  DR DAVID [facility]    Myoview March 2014: No ischemia or infarction  . Diastolic dysfunction without heart failure     Moderate LVH, Gr 1 DD on Echo 2011; no CHF admissions, no tt on diuretisc  . Hyperlipidemia LDL goal <70   . Essential hypertension   . Colon polyps 05/10/2005    Hyperplastic polyps  . Depression   . OSA on CPAP     bipap  . Heart murmur     hx - no notable valvular lesion on Echo  . Cancer [facility])     prostate , melanoma head  . Arthritis   . Shingles 07  . Gout   . Complication of anesthesia     "takes long time to wake up"  . Diabetes mellitus type 2 with complications (HCC)      neuropathy; CAD borderline- no med    Past Surgical History  Procedure Laterality Date  . Prostate palladium gold seed implants (118)  11-15-2002    PROSTATE CANCER  . Excision melanoma from forehead  2010  . Coronary angioplasty with stent placement  09-01-2004      Cypher DES x 2 d-m LCx 2.5 mm x 20 mm & 2.5 mm x 13 mm (~2.75 mm)  . Transthoracic echocardiogram  MAR 51,1021    NORMAL EF > 55% / NORMAL FUNCTION WITH IMPAIRED RELAXATION AND MODERATE CONCENTRIC LVH LIKELY HYPERTENSIVE DISEASE  . Hydrocele excision  10/14/2011    Procedure: HYDROCELECTOMY ADULT;  Surgeon: Ailene Rud, MD;  Location: [facility];  Service: Urology;  Laterality: Left;  . Circumsision    . Laminectomy N/A 07/13/2012    Procedure: THORACIC LAMINECTOMY FOR RESECTION OF INTRAMEDULLARY SPINAL CHORD TUMOR WITH SPINAL CHORD MONITORING;  Surgeon: Erline Levine, MD;  Location: [facility];  Service: Neurosurgery;  Laterality: N/A;  Thoracic laminectomy for resection of intramedullary spinal cord tumor with spinal cord monitering and Dr. Christella Noa to assist  . Nm myoview ltd  3/212014    Lexiscan: LOW RISK, mild  inferior bowel artifact; No ischemia or Infarction  . Eye surgery Bilateral     cataracts  . Tonsillectomy    . Laminectomy N/A 12/09/2014    Procedure: Redo Thoracic laminectomy with intramedullary tumar resection/USN/Cusa/Subarachnoid shunt/spinal cord monitoring/Dr. Kathyrn Sheriff to assist;  Surgeon: Erline Levine, MD;  Location: [place] [facility];  Service: Neurosurgery;  Laterality: N/A;  Redo Thoracic laminectomy with intramedullary tumar resection/USN/Cusa/Subarachnoid shunt/spinal cord monitoring/Dr. Kathyrn Sheriff to assist    There were no vitals filed for this visit.  Visit Diagnosis:  Abnormality of gait  Unsteadiness  Impaired sensation      Subjective Assessment - 04/17/15 1110    Subjective Pt reports no significant changes, no falls. Reports he has been "practicing exercises" at  home; however, pt unable to effectively recall 2 home exercises.   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 No/denies                         [facility] Adult PT Treatment/Exercise - 04/18/15 0001    Ambulation/Gait   Ambulation/Gait Yes   Ambulation/Gait Assistance 5: Supervision;6: Modified independent (Device/Increase time)   Ambulation/Gait Assistance Details Initially requires cueing for safe gait velocity for gait over level, increased time for gait over level, indoor surfaces without AD. Pt mod I when ambulating at safe gait speed. Over all outdoor surfaces, pt requires use of SPC and supervision, cueing for consistent use of AD.   Ambulation Distance (Feet) 1200 Feet   Assistive device Straight cane;None;Other (Comment)  neoprene sleeve on R knee   Gait Pattern Step-through pattern;Decreased stride length;Right foot flat;Right flexed knee in stance;Left foot flat   Ambulation Surface Level;Unlevel;Indoor;Outdoor;Paved   Ramp 5: Supervision   Ramp Details (indicate cue type and reason) using SPC   Curb 5: Supervision   Curb Details (indicate cue type and reason) using [facility]         Vestibular Treatment/Exercise - 04/18/15 0001    Vestibular Treatment/Exercise   Vestibular Treatment Provided Gaze   Gaze Exercises X1 Viewing Horizontal;X1 Viewing Vertical   X1 Viewing Horizontal   Foot Position standing   Comments x30 sec  requires cueing for setup, technique   X1 Viewing Vertical   Foot Position standing   Comments x30 sec  requires cueing for setup, technique            Balance Exercises - 04/18/15 1302    Balance Exercises: Standing   Standing Eyes Closed Wide  (BOA);Foam/compliant surface;Other (comment)  requires cueing for setup, technique with home exercise             PT Short Term Goals - 04/17/15 1128    PT SHORT TERM GOAL #1   Title Pt will perform initial HEP with mod I using paper handout to maximize functional gains made in PT. Target date: 03/19/15   Baseline Initially met on 12/9 using paper handout; however, pt unable to recall 2 home exercises for which he was responsible on 1/13.   Status Partially Met   PT SHORT TERM GOAL #2   Title Pt will improve Berg score from 28 to 31/56 to indicate improved functional standing balance. Target date: 03/19/15   Baseline B12/23: Berg score =  37/56.   Status Achieved   PT SHORT TERM GOAL #3   Title Pt will negotiate 10 stairs with 2 rails and mod I to indicate pt safety accessing second floor of home. Target date: 03/19/15   Baseline Met 12/13.   Status Achieved   PT SHORT TERM GOAL #4   Title Pt will ambulate 200' over level, indoor surfaces with mod I and safe gait speed and appropriate use of AD. Target date: 03/19/15   Baseline May 02, 2022: Pt continues to require supervision at times for gait > 200' with SPC due to poor safety awareness, unsafe gait speed.   Status Partially Met   PT SHORT TERM GOAL #5   Title Further assess origin of dizziness, lightheadedness to prevent future falls. Target date: 03/19/15.   Status Achieved           PT Long Term Goals - 05/03/2015 1121    PT LONG TERM GOAL #1   Title Pt will verbalize understanding of fall prevention strategies in home environment to decrease risk of falls within home. Target date: 04/16/15   Baseline 1/10: pt able to recall theme of creating multisensory environment.    Status Partially Met   PT LONG TERM GOAL #2   Title Pt will improve Berg score from 28 to 41/56 to indicate significant improvement in functional standing balance. Target date: 04/16/15   Baseline Met 12/23 with Berg score of 46/56   Status Achieved   PT LONG  TERM GOAL #3   Title Pt will ambulate 1,000' over unlevel, paved surfaces with mod I using LRAD with good safety awareness and no overt LOB to indicate improved safety/independence with community mobility. Target date: 04/16/15   Baseline Supervision for safety awareness, to properly utilize cane, and to decrease gait speed.   Status Not Met   PT LONG TERM GOAL #4   Title Pt will negotiate standard ramp and curb step with mod I using LRAD to indicate pt ability to traverse community obstacles. Target date: 04/16/15   Baseline Requires supervision.   Status Not Met   PT LONG TERM GOAL #5   Title Pt will verbalize ability to lead while dancing without loss of balance to enable pt to engage in leisure activities, improve QOL. Target date: 04/16/15   Baseline Met 05/02/2022.   Status Achieved               Plan - 04/18/15 1309    Clinical Impression Statement Pt has met 3/5 STG's and 2/5 LTG's, has maximized stability/independence with functional mobility, and demonstrates inconsistent compliance with HEP. Pt will therefore be discharged from outpatient PT at this time.   Consulted and Agree with Plan of Care Patient          G-Codes - 05-03-2015 1430    Functional Assessment Tool Used Merrilee Jansky 46/56   Functional Limitation Mobility: Walking and moving around   Mobility: Walking and Moving Around Goal Status [phone]) At least 1 percent but less than 20 percent impaired, limited or restricted   Mobility: Walking and Moving Around Discharge Status [phone]) At least 1 percent but less than 20 percent impaired, limited or restricted      Problem List Patient Active Problem List   Diagnosis Date Noted  . Diabetes type 2, uncontrolled ([place])   . Thoracic spine tumor 12/12/2014  . Neurogenic bowel 12/12/2014  . Incomplete paraplegia ([place]) 12/12/2014  . Back pain 12/09/2014  . Malignant tumor spinal cord ([place]) 12/09/2014  . Pre-operative cardiovascular  examination 09/17/2014  . Other fatigue  09/17/2014  . Chest pain with moderate risk for cardiac etiology 09/17/2014  . Ependymoma of spinal cord ([place]) 11/01/2013  . Hereditary and idiopathic peripheral neuropathy 11/01/2013  . Diastolic dysfunction without heart failure   . Obesity (BMI 30-39.9) 03/22/2013  . OSA on CPAP 03/19/2012  . H/O prostate cancer 03/19/2012  . Essential hypertension 03/19/2012  . Hyperlipidemia LDL goal <70 03/19/2012  . H/O hydrocele 03/19/2012  . Hx of melanoma excision 03/19/2012  . Personal history of colonic polyps 03/19/2012  . CAD S/P percutaneous coronary angioplasty: Cypher DES x2 to circumflex 08/17/2004   PHYSICAL THERAPY DISCHARGE SUMMARY  Visits from Start of Care: 12  Current functional level related to goals / functional outcomes: See above.   Remaining deficits: Requires use of SPC for community mobility; continues to require intermittent cueing for safety awareness (safe gait velocity) and for consistent, appropriate use of AD. Per Sensory Organization Test, pt ability to utilize vestibular and somatosensory input are abnormal compared to age/height normative values. Pt has been provided with HEP to directly address vestibular impairments but has demonstrated inconsistent HEP compliance.   Education / Equipment: HEP; fall prevention strategies; importance of creating multisensory environment due to SOT findings.  Plan: Patient agrees to discharge.  Patient goals were partially met. Patient is being discharged due to lack of progress.  ?????        Billie Ruddy, PT, [facility] [address] [place], [place], [address] Phone: [phone]   Fax:  [phone] 04/18/2015, 1:14 PM  Name: [name] MRN: [id number] Date of Birth: October 21, 1935

## 2015-04-21 ENCOUNTER — Ambulatory Visit: Payer: Medicare Other | Admitting: Rehabilitation

## 2015-04-24 ENCOUNTER — Ambulatory Visit: Payer: Medicare Other | Admitting: Physical Therapy

## 2015-04-25 ENCOUNTER — Other Ambulatory Visit: Payer: Self-pay | Admitting: Physical Medicine & Rehabilitation

## 2015-04-27 DIAGNOSIS — E782 Mixed hyperlipidemia: Secondary | ICD-10-CM | POA: Diagnosis not present

## 2015-04-27 DIAGNOSIS — I10 Essential (primary) hypertension: Secondary | ICD-10-CM | POA: Diagnosis not present

## 2015-04-27 DIAGNOSIS — Z7984 Long term (current) use of oral hypoglycemic drugs: Secondary | ICD-10-CM | POA: Diagnosis not present

## 2015-04-27 DIAGNOSIS — Z125 Encounter for screening for malignant neoplasm of prostate: Secondary | ICD-10-CM | POA: Diagnosis not present

## 2015-04-27 DIAGNOSIS — E119 Type 2 diabetes mellitus without complications: Secondary | ICD-10-CM | POA: Diagnosis not present

## 2015-04-27 DIAGNOSIS — Z1389 Encounter for screening for other disorder: Secondary | ICD-10-CM | POA: Diagnosis not present

## 2015-04-27 DIAGNOSIS — F324 Major depressive disorder, single episode, in partial remission: Secondary | ICD-10-CM | POA: Diagnosis not present

## 2015-05-05 DIAGNOSIS — M549 Dorsalgia, unspecified: Secondary | ICD-10-CM | POA: Diagnosis not present

## 2015-05-12 ENCOUNTER — Other Ambulatory Visit: Payer: Self-pay | Admitting: Neurosurgery

## 2015-05-12 DIAGNOSIS — Z6831 Body mass index (BMI) 31.0-31.9, adult: Secondary | ICD-10-CM | POA: Diagnosis not present

## 2015-05-12 DIAGNOSIS — D497 Neoplasm of unspecified behavior of endocrine glands and other parts of nervous system: Secondary | ICD-10-CM | POA: Diagnosis not present

## 2015-05-12 DIAGNOSIS — M4714 Other spondylosis with myelopathy, thoracic region: Secondary | ICD-10-CM | POA: Diagnosis not present

## 2015-05-15 ENCOUNTER — Encounter: Payer: Self-pay | Admitting: Gastroenterology

## 2015-05-27 ENCOUNTER — Other Ambulatory Visit: Payer: Medicare Other

## 2015-05-29 ENCOUNTER — Ambulatory Visit
Admission: RE | Admit: 2015-05-29 | Discharge: 2015-05-29 | Disposition: A | Discharge status: Home / Self Care | Payer: Medicare Other | Source: Ambulatory Visit | Attending: Neurosurgery | Admitting: Neurosurgery

## 2015-05-29 DIAGNOSIS — D497 Neoplasm of unspecified behavior of endocrine glands and other parts of nervous system: Secondary | ICD-10-CM

## 2015-05-29 DIAGNOSIS — M50223 Other cervical disc displacement at C6-C7 level: Secondary | ICD-10-CM | POA: Diagnosis not present

## 2015-05-29 DIAGNOSIS — M47814 Spondylosis without myelopathy or radiculopathy, thoracic region: Secondary | ICD-10-CM | POA: Diagnosis not present

## 2015-05-29 MED ORDER — GADOBENATE DIMEGLUMINE 529 MG/ML IV SOLN
20.0000 mL | Freq: Once | INTRAVENOUS | Status: AC | PRN
  Administered 2015-05-29: 20 mL via INTRAVENOUS

## 2015-06-02 ENCOUNTER — Ambulatory Visit
Admission: RE | Admit: 2015-06-02 | Discharge: 2015-06-02 | Disposition: A | Discharge status: Home / Self Care | Payer: Medicare Other | Source: Ambulatory Visit | Attending: Neurosurgery | Admitting: Neurosurgery

## 2015-06-02 DIAGNOSIS — M4806 Spinal stenosis, lumbar region: Secondary | ICD-10-CM | POA: Diagnosis not present

## 2015-06-02 DIAGNOSIS — M549 Dorsalgia, unspecified: Secondary | ICD-10-CM | POA: Diagnosis not present

## 2015-06-02 DIAGNOSIS — D497 Neoplasm of unspecified behavior of endocrine glands and other parts of nervous system: Secondary | ICD-10-CM

## 2015-06-02 MED ORDER — GADOBENATE DIMEGLUMINE 529 MG/ML IV SOLN
19.0000 mL | Freq: Once | INTRAVENOUS | Status: AC | PRN
  Administered 2015-06-02: 19 mL via INTRAVENOUS

## 2015-06-03 DIAGNOSIS — R55 Syncope and collapse: Secondary | ICD-10-CM

## 2015-06-03 DIAGNOSIS — M79604 Pain in right leg: Secondary | ICD-10-CM | POA: Diagnosis not present

## 2015-06-03 HISTORY — DX: Syncope and collapse: R55

## 2015-06-16 DIAGNOSIS — M4714 Other spondylosis with myelopathy, thoracic region: Secondary | ICD-10-CM | POA: Diagnosis not present

## 2015-06-16 DIAGNOSIS — D497 Neoplasm of unspecified behavior of endocrine glands and other parts of nervous system: Secondary | ICD-10-CM | POA: Diagnosis not present

## 2015-06-16 DIAGNOSIS — M549 Dorsalgia, unspecified: Secondary | ICD-10-CM | POA: Diagnosis not present

## 2015-06-16 DIAGNOSIS — Z6830 Body mass index (BMI) 30.0-30.9, adult: Secondary | ICD-10-CM | POA: Diagnosis not present

## 2015-06-18 ENCOUNTER — Telehealth: Payer: Self-pay | Admitting: Cardiology

## 2015-06-18 ENCOUNTER — Encounter (HOSPITAL_COMMUNITY): Payer: Self-pay | Admitting: Emergency Medicine

## 2015-06-18 ENCOUNTER — Emergency Department (HOSPITAL_COMMUNITY): Payer: Medicare Other

## 2015-06-18 ENCOUNTER — Emergency Department (HOSPITAL_COMMUNITY)
Admission: EM | Admit: 2015-06-18 | Discharge: 2015-06-18 | Disposition: A | Discharge status: Home / Self Care | Payer: Medicare Other | Attending: Emergency Medicine | Admitting: Emergency Medicine

## 2015-06-18 DIAGNOSIS — Z7984 Long term (current) use of oral hypoglycemic drugs: Secondary | ICD-10-CM | POA: Insufficient documentation

## 2015-06-18 DIAGNOSIS — Z85820 Personal history of malignant melanoma of skin: Secondary | ICD-10-CM | POA: Diagnosis not present

## 2015-06-18 DIAGNOSIS — R404 Transient alteration of awareness: Secondary | ICD-10-CM | POA: Diagnosis not present

## 2015-06-18 DIAGNOSIS — M109 Gout, unspecified: Secondary | ICD-10-CM | POA: Insufficient documentation

## 2015-06-18 DIAGNOSIS — Z7902 Long term (current) use of antithrombotics/antiplatelets: Secondary | ICD-10-CM | POA: Insufficient documentation

## 2015-06-18 DIAGNOSIS — Z8619 Personal history of other infectious and parasitic diseases: Secondary | ICD-10-CM | POA: Diagnosis not present

## 2015-06-18 DIAGNOSIS — Z9981 Dependence on supplemental oxygen: Secondary | ICD-10-CM | POA: Insufficient documentation

## 2015-06-18 DIAGNOSIS — G4733 Obstructive sleep apnea (adult) (pediatric): Secondary | ICD-10-CM | POA: Diagnosis not present

## 2015-06-18 DIAGNOSIS — Z7982 Long term (current) use of aspirin: Secondary | ICD-10-CM | POA: Diagnosis not present

## 2015-06-18 DIAGNOSIS — E785 Hyperlipidemia, unspecified: Secondary | ICD-10-CM | POA: Diagnosis not present

## 2015-06-18 DIAGNOSIS — F329 Major depressive disorder, single episode, unspecified: Secondary | ICD-10-CM | POA: Insufficient documentation

## 2015-06-18 DIAGNOSIS — M1990 Unspecified osteoarthritis, unspecified site: Secondary | ICD-10-CM | POA: Diagnosis not present

## 2015-06-18 DIAGNOSIS — I499 Cardiac arrhythmia, unspecified: Secondary | ICD-10-CM | POA: Diagnosis not present

## 2015-06-18 DIAGNOSIS — Z86010 Personal history of colonic polyps: Secondary | ICD-10-CM | POA: Insufficient documentation

## 2015-06-18 DIAGNOSIS — Z9861 Coronary angioplasty status: Secondary | ICD-10-CM | POA: Insufficient documentation

## 2015-06-18 DIAGNOSIS — I10 Essential (primary) hypertension: Secondary | ICD-10-CM | POA: Insufficient documentation

## 2015-06-18 DIAGNOSIS — H9190 Unspecified hearing loss, unspecified ear: Secondary | ICD-10-CM | POA: Diagnosis not present

## 2015-06-18 DIAGNOSIS — I2510 Atherosclerotic heart disease of native coronary artery without angina pectoris: Secondary | ICD-10-CM | POA: Insufficient documentation

## 2015-06-18 DIAGNOSIS — E1140 Type 2 diabetes mellitus with diabetic neuropathy, unspecified: Secondary | ICD-10-CM | POA: Diagnosis not present

## 2015-06-18 DIAGNOSIS — R55 Syncope and collapse: Secondary | ICD-10-CM | POA: Insufficient documentation

## 2015-06-18 DIAGNOSIS — Z8546 Personal history of malignant neoplasm of prostate: Secondary | ICD-10-CM | POA: Diagnosis not present

## 2015-06-18 DIAGNOSIS — Z79899 Other long term (current) drug therapy: Secondary | ICD-10-CM | POA: Insufficient documentation

## 2015-06-18 DIAGNOSIS — Z87438 Personal history of other diseases of male genital organs: Secondary | ICD-10-CM | POA: Diagnosis not present

## 2015-06-18 DIAGNOSIS — R011 Cardiac murmur, unspecified: Secondary | ICD-10-CM | POA: Diagnosis not present

## 2015-06-18 DIAGNOSIS — T1490 Injury, unspecified: Secondary | ICD-10-CM | POA: Diagnosis not present

## 2015-06-18 LAB — DIFFERENTIAL
Basophils Absolute: 0 10*3/uL (ref 0.0–0.1)
Basophils Relative: 0 %
Eosinophils Absolute: 0.1 10*3/uL (ref 0.0–0.7)
Eosinophils Relative: 1 %
Lymphocytes Relative: 12 %
Lymphs Abs: 1.1 10*3/uL (ref 0.7–4.0)
Monocytes Absolute: 0.9 10*3/uL (ref 0.1–1.0)
Monocytes Relative: 9 %
Neutro Abs: 7.6 10*3/uL (ref 1.7–7.7)
Neutrophils Relative %: 78 %

## 2015-06-18 LAB — COMPREHENSIVE METABOLIC PANEL
ALT: 17 U/L (ref 17–63)
AST: 21 U/L (ref 15–41)
Albumin: 3.2 g/dL — ABNORMAL LOW (ref 3.5–5.0)
Alkaline Phosphatase: 37 U/L — ABNORMAL LOW (ref 38–126)
Anion gap: 13 (ref 5–15)
BUN: 25 mg/dL — ABNORMAL HIGH (ref 6–20)
CO2: 23 mmol/L (ref 22–32)
Calcium: 8.7 mg/dL — ABNORMAL LOW (ref 8.9–10.3)
Chloride: 104 mmol/L (ref 101–111)
Creatinine, Ser: 1.45 mg/dL — ABNORMAL HIGH (ref 0.61–1.24)
GFR calc Af Amer: 52 mL/min — ABNORMAL LOW (ref >60)
GFR calc non Af Amer: 45 mL/min — ABNORMAL LOW (ref >60)
Glucose, Bld: 139 mg/dL — ABNORMAL HIGH (ref 65–99)
Potassium: 3.8 mmol/L (ref 3.5–5.1)
Sodium: 140 mmol/L (ref 135–145)
Total Bilirubin: 0.4 mg/dL (ref 0.3–1.2)
Total Protein: 5.8 g/dL — ABNORMAL LOW (ref 6.5–8.1)

## 2015-06-18 LAB — CBC
HCT: 39.9 % (ref 39.0–52.0)
Hemoglobin: 12.9 g/dL — ABNORMAL LOW (ref 13.0–17.0)
MCH: 28.1 pg (ref 26.0–34.0)
MCHC: 32.3 g/dL (ref 30.0–36.0)
MCV: 86.9 fL (ref 78.0–100.0)
Platelets: 182 10*3/uL (ref 150–400)
RBC: 4.59 MIL/uL (ref 4.22–5.81)
RDW: 14.7 % (ref 11.5–15.5)
WBC: 9.7 10*3/uL (ref 4.0–10.5)

## 2015-06-18 LAB — I-STAT TROPONIN, ED: Troponin i, poc: 0.01 ng/mL (ref 0.00–0.08)

## 2015-06-18 LAB — PROTIME-INR
INR: 1.17 (ref 0.00–1.49)
Prothrombin Time: 15.1 seconds (ref 11.6–15.2)

## 2015-06-18 LAB — APTT: aPTT: 33 seconds (ref 24–37)

## 2015-06-18 NOTE — Discharge Instructions (Signed)

## 2015-06-18 NOTE — ED Notes (Signed)
PT attempts to provide urine sample. PT reports he cannot do so at this time.

## 2015-06-18 NOTE — ED Notes (Signed)
MD at bedside. 

## 2015-06-18 NOTE — ED Notes (Signed)
PT had a syncopal episode last week 110/70 BP and CBG 110 which are both low for him according to wife. Last week, he fell from a standing position and lost consciousness. Today, PT had a very large BM, walked upstairs, and passed out and fell to the floor from a standing position PT called his wife after he woke up. PT struck his left shoulder on a piece of furniture.

## 2015-06-18 NOTE — ED Provider Notes (Signed)
CSN: [id number]     Arrival date & time 06/18/15  1602 History   First MD Initiated Contact with Patient 06/18/15 1622     Chief Complaint  Patient presents with  . Loss of Consciousness   HPI Pt presents to the ED after a syncopal episode.  The patient went to the bathroom. He had a large bowel movement. He felt fine however and started walking up stairs. When he got up stairs into the room he suddenly started to feel lightheaded and dizzy. He was actually on the phone with someone and fell to the ground striking his shoulder on the furniture.  He does not think he lost consciousness because he does remember the entire fall. He called his wife and was able to get up afterwards. He denies any trouble with any headache or neck pain. He denies any trouble with chest pain or abdominal pain. No vomiting or diarrhea. Right now, he feels fine and has no complaints. Patient did have a similar episode last week. He spoke to a family friend who is a Tax adviser.  I thought his symptoms could be related to a medication that he was on. Past Medical History  Diagnosis Date  . History of prostate cancer 2004--  S/P SEED IMPLANTS     NO RECURRENCE  . S/P primary angioplasty with coronary stent 2006--      X2 DE STENTS - Cypher 2.5 mm postdilated to 2.75 mm (20 mm and 13 mm stents)  . Urgency of urination   . Frequency of urination   . H/O: gout STABLE  . Impaired hearing RIGHT HEARING AID  . Hydrocele, left   . History of melanoma excision 2010    FOREHEAD  . CAD S/P percutaneous coronary angioplasty CARDIOLOGIST-  DR DAVID [facility]    Myoview March 2014: No ischemia or infarction  . Diastolic dysfunction without heart failure     Moderate LVH, Gr 1 DD on Echo 2011; no CHF admissions, no tt on diuretisc  . Hyperlipidemia LDL goal <70   . Essential hypertension   . Colon polyps 05/10/2005    Hyperplastic polyps  . Depression   . OSA on CPAP     bipap  . Heart murmur     hx - no notable valvular lesion  on Echo  . Prostate cancer ([place])     prostate , melanoma head  . Arthritis   . Shingles 07  . Gout   . Complication of anesthesia     "takes long time to wake up"  . Diabetes mellitus type 2 with complications (HCC)     neuropathy; CAD borderline- no med   Past Surgical History  Procedure Laterality Date  . Prostate palladium gold seed implants (118)  11-15-2002    PROSTATE CANCER  . Excision melanoma from forehead  2010  . Coronary angioplasty with stent placement  09-01-2004      Cypher DES x 2 d-m LCx 2.5 mm x 20 mm & 2.5 mm x 13 mm (~2.75 mm)  . Transthoracic echocardiogram  MAR [id number]    NORMAL EF > 55% / NORMAL FUNCTION WITH IMPAIRED RELAXATION AND MODERATE CONCENTRIC LVH LIKELY HYPERTENSIVE DISEASE  . Hydrocele excision  10/14/2011    Procedure: HYDROCELECTOMY ADULT;  Surgeon: Ailene Rud, MD;  Location: [facility];  Service: Urology;  Laterality: Left;  . Circumsision    . Laminectomy N/A 07/13/2012    Procedure: THORACIC LAMINECTOMY FOR RESECTION OF INTRAMEDULLARY SPINAL CHORD TUMOR WITH SPINAL  CHORD MONITORING;  Surgeon: Erline Levine, MD;  Location: [place] NEURO ORS;  Service: Neurosurgery;  Laterality: N/A;  Thoracic laminectomy for resection of intramedullary spinal cord tumor with spinal cord monitering and Dr. Christella Noa to assist  . Nm myoview ltd  3/212014    Lexiscan: LOW RISK, mild inferior bowel artifact; No ischemia or Infarction  . Eye surgery Bilateral     cataracts  . Tonsillectomy    . Laminectomy N/A 12/09/2014    Procedure: Redo Thoracic laminectomy with intramedullary tumar resection/USN/Cusa/Subarachnoid shunt/spinal cord monitoring/Dr. Kathyrn Sheriff to assist;  Surgeon: Erline Levine, MD;  Location: [place] [facility]UR[facility];  Service: Neurosurgery;  Laterality: N/A;  Redo Thoracic laminectomy with intramedullary tumar resection/USN/Cusa/Subarachnoid shunt/spinal cord monitoring/Dr. Kathyrn Sheriff to assist  . Skin biopsy     Family History  Problem Relation  Age of Onset  . Hypertension Mother   . Stroke Mother   . Hypertension Father   . Prostate cancer Father   . Hypertension Brother   . Prostate cancer Brother    Social History  Substance Use Topics  . Smoking status: Never Smoker   . Smokeless tobacco: Never Used     Comment: occ alcohol  . Alcohol Use: Yes     Comment: OCCASIONAL    Review of Systems  All other systems reviewed and are negative.     Allergies  Betadine; Contrast media; Iodine; and Shellfish allergy  Home Medications   Prior to Admission medications   Medication Sig Start Date End Date Taking? Authorizing Provider  allopurinol (ZYLOPRIM) 300 MG tablet Take 1 tablet (300 mg total) by mouth daily. 12/25/14  Yes Daniel J Angiulli, PA-C  amLODipine (NORVASC) 10 MG tablet Take 1 tablet (10 mg total) by mouth daily. 12/25/14  Yes Daniel J Angiulli, PA-C  aspirin EC 81 MG tablet Take 81 mg by mouth daily.   Yes Historical Provider, MD  clopidogrel (PLAVIX) 75 MG tablet Take 1 tablet (75 mg total) by mouth daily. 12/25/14  Yes Daniel J Angiulli, PA-C  escitalopram (LEXAPRO) 10 MG tablet Take 1 tablet (10 mg total) by mouth daily. 12/25/14  Yes Daniel J Angiulli, PA-C  HYDROcodone-acetaminophen (NORCO/VICODIN) 5-325 MG tablet Take 1 tablet by mouth every 6 (six) hours as needed. 05/08/15  Yes Historical Provider, MD  metFORMIN (GLUCOPHAGE) 500 MG tablet TAKE 1 TABLET BY MOUTH TWICE A DAY WITH A MEAL 04/27/15  Yes Charlett Blake, MD  oxybutynin (DITROPAN XL) 15 MG 24 hr tablet Take 1 tablet (15 mg total) by mouth at bedtime. 12/25/14  Yes Daniel J Angiulli, PA-C  Polyvinyl Alcohol-Povidone (REFRESH OP) Place 1 drop into both eyes daily as needed. For dry eyes per patient   Yes Historical Provider, MD  rosuvastatin (CRESTOR) 20 MG tablet Take 1 tablet (20 mg total) by mouth daily. 12/25/14  Yes Daniel J Angiulli, PA-C  tamsulosin (FLOMAX) 0.4 MG CAPS capsule Take 1 capsule (0.4 mg total) by mouth at bedtime. 12/25/14  Yes  Daniel J Angiulli, PA-C  valsartan (DIOVAN) 80 MG tablet Take 1 tablet (80 mg total) by mouth daily. 12/25/14  Yes Daniel J Angiulli, PA-C  VITAMIN D, CHOLECALCIFEROL, PO Take 1 tablet by mouth daily.    Yes Historical Provider, MD  methocarbamol (ROBAXIN) 500 MG tablet Take 1 tablet (500 mg total) by mouth every 6 (six) hours as needed for muscle spasms. 12/25/14   Lavon Paganini Angiulli, PA-C   BP 146/71 mmHg  Pulse 39  Temp(Src) 97.5 F (36.4 C) (Oral)  Resp 14  Ht 5\' 9"  (1.753 m)  Wt 90.266 kg  BMI 29.37 kg/m2  SpO2 95% Physical Exam  Constitutional: He is oriented to person, place, and time. He appears well-developed and well-nourished. No distress.  HENT:  Head: Normocephalic and atraumatic.  Right Ear: External ear normal.  Left Ear: External ear normal.  Mouth/Throat: Oropharynx is clear and moist.  Eyes: Conjunctivae are normal. Right eye exhibits no discharge. Left eye exhibits no discharge. No scleral icterus.  Neck: Neck supple. No tracheal deviation present.  Cardiovascular: Normal rate and intact distal pulses.  An irregular rhythm present.  Pulmonary/Chest: Effort normal and breath sounds normal. No stridor. No respiratory distress. He has no wheezes. He has no rales.  Abdominal: Soft. Bowel sounds are normal. He exhibits no distension. There is no tenderness. There is no rebound and no guarding.  Musculoskeletal: He exhibits no edema or tenderness.       Right shoulder: Normal.       Left shoulder: Normal.       Cervical back: Normal.       Thoracic back: Normal.       Lumbar back: Normal.  Neurological: He is alert and oriented to person, place, and time. He has normal strength. No cranial nerve deficit (no facial droop, extraocular movements intact, no slurred speech) or sensory deficit. He exhibits normal muscle tone. He displays no seizure activity. Coordination normal.  No pronator drift bilateral upper extrem, able to hold both legs off bed for 5 seconds, sensation  intact in all extremities, no visual field cuts, no left or right sided neglect, normal finger-nose exam bilaterally, no nystagmus noted   Skin: Skin is warm and dry. No rash noted. He is not diaphoretic.  Psychiatric: He has a normal mood and affect.  Nursing note and vitals reviewed.   ED Course  Procedures (including critical care time) Labs Review Labs Reviewed  CBC - Abnormal; Notable for the following:    Hemoglobin 12.9 (*)    All other components within normal limits  COMPREHENSIVE METABOLIC PANEL - Abnormal; Notable for the following:    Glucose, Bld 139 (*)    BUN 25 (*)    Creatinine, Ser 1.45 (*)    Calcium 8.7 (*)    Total Protein 5.8 (*)    Albumin 3.2 (*)    Alkaline Phosphatase 37 (*)    GFR calc non Af Amer 45 (*)    GFR calc Af Amer 52 (*)    All other components within normal limits  PROTIME-INR  APTT  DIFFERENTIAL  URINE RAPID DRUG SCREEN, HOSP PERFORMED  URINALYSIS, ROUTINE W REFLEX MICROSCOPIC (NOT AT [facility])  I-STAT TROPOININ, ED    Imaging Review Ct Head Wo Contrast  06/18/2015  CLINICAL DATA:  Syncope EXAM: CT HEAD WITHOUT CONTRAST TECHNIQUE: Contiguous axial images were obtained from the base of the skull through the vertex without intravenous contrast. COMPARISON:  None. FINDINGS: Brain: There is mild generalized brain atrophy with commensurate dilatation of the ventricles and sulci. There is no mass, hemorrhage, edema or other evidence of acute parenchymal abnormality. No extra-axial hemorrhage. Vascular: No hyperdense vessel or unexpected calcification. There are chronic calcified atherosclerotic changes of the large vessels at the skull base. Skull: Negative for fracture or focal lesion. Sinuses/Orbits: No acute findings. Other: None. IMPRESSION: Negative head CT.  No intracranial mass, hemorrhage or edema. Electronically Signed   By: Franki Cabot M.D.   On: 06/18/2015 18:30   I have personally reviewed and evaluated these images  and lab results as  part of my medical decision-making.   EKG Interpretation   Date/Time:  Thursday June 18 2015 16:15:03 EDT Ventricular Rate:  84 PR Interval:  162 QRS Duration: 93 QT Interval:  384 QTC Calculation: [id number] R Axis:   1 Text Interpretation:  Sinus or ectopic atrial rhythm , ectopic rhythm is  new since prior tracing, ?intermittent a fib Atrial premature complexes  Abnormal R-wave progression, late transition Confirmed by Novah Nessel  MD-J, Jasman Pfeifle  [id number]) on 06/18/2015 4:52:48 PM      MDM   Final diagnoses:  Syncope, unspecified syncope type    Patient had a syncopal episode today. It does not clearly sound like a vasovagal episode. He had a bowel movement prior to the episode but he had managed to walk all the way up the stairs and into a different room. Patient also had another episode one week ago. I discussed staying in the hospital overnight for observation. Patient states he feels well and does not want to be admitted to the hospital. He preferred to follow up with his primary care doctor. He understands to return to the emergency room immediately for recurrent symptoms, chest pain shortness of breath or other concerning symptoms.    Dorie Rank, MD 06/18/15 [phone]

## 2015-06-18 NOTE — Telephone Encounter (Signed)
Patient was transferred into triage. His wife, son and friend, a retired Administrator, Civil Service were all on the phone with him.  Spoke briefly to patient who sounds shaken up.  Got up to answer the phone and passed out on the way pick it up.  Denies hitting his head. Denies any injury at this time.  His friend then took the phone and states patient passed out last week as well, he had started neurontin and they associated it to the medication.  Reported that BP was 110/40 sitting, 112/50 standing and HR is irregular and 80s-90s currently. Advised that patient needs to go to ED for evaluation.  Friend requested that he come to office for a monitor vs waiting in the ED for 8 hours.  I reached [facility] triage, Dr. Ellyn Hack is in office there today.    Spoke with their triage nurse who agreed that patient should be taken to ED vs coming to office to be evaluated.    Informed patients friend, Dr. Reino Bellis?, who verbalizes understanding.

## 2015-06-18 NOTE — Telephone Encounter (Signed)
Pt c/o Syncope: STAT if syncope occurred within 30 minutes and pt complains of lightheadedness High Priority if episode of passing out, completely, today or in last 24 hours   1. Did you pass out today? Yes    2. When is the last time you passed out? 15 minutes   3. Has this occurred multiple times? 1 time about 4-5 days ago   4. Did you have any symptoms prior to passing out? No  5.

## 2015-06-23 ENCOUNTER — Encounter: Payer: Self-pay | Admitting: Physician Assistant

## 2015-06-23 ENCOUNTER — Ambulatory Visit (INDEPENDENT_AMBULATORY_CARE_PROVIDER_SITE_OTHER): Payer: Medicare Other | Admitting: Physician Assistant

## 2015-06-23 VITALS — BP 118/78 | HR 64 | Ht 69.0 in | Wt 205.0 lb

## 2015-06-23 DIAGNOSIS — I2510 Atherosclerotic heart disease of native coronary artery without angina pectoris: Secondary | ICD-10-CM | POA: Diagnosis not present

## 2015-06-23 DIAGNOSIS — R55 Syncope and collapse: Secondary | ICD-10-CM | POA: Diagnosis not present
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
## 2015-06-23 NOTE — Patient Instructions (Addendum)
Medication Instructions:   NO CHANGES  Labwork: NONE  Testing/Procedures: Your physician has recommended that you wear an event monitor. Event monitors are medical devices that record the heart's electrical activity. Doctors most often [place] these monitors to diagnose arrhythmias. Arrhythmias are problems with the speed or rhythm of the heartbeat. The monitor is a small, portable device. You can wear one while you do your normal daily activities. This is usually used to diagnose what is causing palpitations/syncope (passing out).  72 DAYS   Follow-Up:Your physician recommends that you schedule a follow-up appointment in:  [place]   Any Other Special Instructions Will Be Listed Below (If Applicable).     If you need a refill on your cardiac medications before your next appointment, please call your pharmacy.

## 2015-06-29 DIAGNOSIS — Z961 Presence of intraocular lens: Secondary | ICD-10-CM | POA: Diagnosis not present

## 2015-06-30 ENCOUNTER — Ambulatory Visit (INDEPENDENT_AMBULATORY_CARE_PROVIDER_SITE_OTHER): Payer: Medicare Other

## 2015-06-30 DIAGNOSIS — R55 Syncope and collapse: Secondary | ICD-10-CM | POA: Diagnosis not present

## 2015-08-11 ENCOUNTER — Ambulatory Visit (INDEPENDENT_AMBULATORY_CARE_PROVIDER_SITE_OTHER): Payer: Medicare Other | Admitting: Cardiology

## 2015-08-11 ENCOUNTER — Encounter: Payer: Self-pay | Admitting: Cardiology

## 2015-08-11 VITALS — BP 132/76 | HR 66 | Ht 69.5 in | Wt 210.8 lb

## 2015-08-11 DIAGNOSIS — I2510 Atherosclerotic heart disease of native coronary artery without angina pectoris: Secondary | ICD-10-CM

## 2015-08-11 DIAGNOSIS — R55 Syncope and collapse: Secondary | ICD-10-CM

## 2015-08-11 DIAGNOSIS — E785 Hyperlipidemia, unspecified: Secondary | ICD-10-CM

## 2015-08-11 DIAGNOSIS — Z9861 Coronary angioplasty status: Secondary | ICD-10-CM | POA: Diagnosis not present

## 2015-08-11 DIAGNOSIS — I10 Essential (primary) hypertension: Secondary | ICD-10-CM | POA: Diagnosis not present

## 2015-08-11 DIAGNOSIS — I491 Atrial premature depolarization: Secondary | ICD-10-CM

## 2015-08-11 MED ORDER — VALSARTAN 80 MG PO TABS: 80.0000 mg | ORAL_TABLET | Freq: Every evening | ORAL | Status: DC

## 2015-08-11 MED ORDER — AMLODIPINE BESYLATE 10 MG PO TABS: 5.0000 mg | ORAL_TABLET | Freq: Every day | ORAL | Status: DC

## 2015-08-11 NOTE — Patient Instructions (Addendum)
Take 1/2 tablet of  10 MG AMLODIPINE IN THE MORNING ,  START TAKING VALSARTAN IN THE EVENING.   NO OTHER CHANGES WITH CURRENT MEDICATIONS   Your physician wants you to follow-up in  SEPT 2017 WITH DR HARDING--30 MIN.  You will receive a reminder letter in the mail two months in advance. If you don't receive a letter, please call our office to schedule the follow-up appointment.   If you need a refill on your cardiac medications before your next appointment, please call your pharmacy.

## 2015-08-11 NOTE — Progress Notes (Signed)
PCP: Irven Shelling, MD  Clinic Note: Chief Complaint  Patient presents with  . Loss of Consciousness    event monitor  . Coronary Artery Disease    History of PCI    HPI: [name] is a 80 y.o. male with a PMH below who presents today for ~1 month f/u. He is a long-term patient of mine who has h/o CAD-PCI back in 2006. At the time of his last visit with me in June 2016, he was in the process of potentially having re-do spinal Surgery for a non-malignant tumor.  [name] was last seen on June 23, 2015 by Almyra Deforest, PA for an episode of Syncope.  1 episode of LOC & 1 fall. Most of the time with change of position - 2nd event walked quickly to phone (shortly after urinating) -- got dizzy & fell. 1st event - shortly after starting Neurontin -> was working in his home office doing filing, rushing around.  Wife says that he came upstairs & told her that he passed out.   Recent Hospitalizations: ER on March 16th  Studies Reviewed:   Myoview, June 2016: LOW RISK study. Normal. No infarction, no scar. PACs and atrial bigeminy noted.    Event Monitor March-April 2017.  Overall sinus rhythm with rates ranging from 58-132 BPM.  No true arrhythmias besides short atrial runs of 4-6 beats  Rare PVCs, occasionally in bigeminy  Frequent PACs, often in single words, couplets, bigeminy and with short runs of PACs/PAT  No significant sinus pauses or sinus bradycardia.  No obvious arrhythmia to explain possible syncope.  Interval History: [name] presents today noting that he really has not had any other further symptoms since the episode noted above.   1 episode of LOC & 1 fall. Most of the time with change of position - 2nd event walked quickly to phone (shortly after urinating) -- got dizzy & fell. 1st event - shortly after starting Neurontin -> was working in his home office doing filing, rushing around.  Wife says that he came upstairs & told her that he passed out.   He has not had any other falls or near syncopal symptoms/syncopal events since then. He has not noted any rapid irregular heartbeat/palpitations. The one thing he does noticeably tires quicker than he used to. He is now having to walk with a cane, but does acknowledge that after his redo back surgery, initially was unable to walk, he is now able to walk but his right leg is not fully functional and therefore he has to walk with a cane. Takes a lot of energy and effort for him to walk. He indicated that the episode when he passed out or shortly after taking Neurontin. He wasn't sure whether that's a good medication for him to continue taking. One thing he does notice it because he is now walking around a lot he is not really been up to get much exercise and has not really exerted himself very much. He has therefore gained some weight as well.  Otherwise from a strictly cardiac standpoint, he has been relatively asymptomatic. Review of symptoms as follows:  No chest pain or shortness of breath with rest or exertion.  No PND, orthopnea or edema.  No palpitations  and no further episodes of lightheadedness, dizziness, weakness or syncope/near syncope. No TIA/amaurosis fugax symptoms. No claudication.  ROS: A comprehensive was performed. Review of Systems  Constitutional: Positive for malaise/fatigue (Deconditioned). Negative for weight loss (Actually gained weight).  HENT: Negative for congestion and nosebleeds.   Respiratory: Negative for cough, shortness of breath and wheezing.   Cardiovascular:       Per history of present illness  Gastrointestinal: Negative for heartburn, blood in stool and melena.  Genitourinary: Negative for hematuria.  Musculoskeletal: Positive for myalgias (Right leg).       He basically has a combination of neuropathic and arthritis pain in the right leg from hip down.  Neurological: Positive for dizziness (Occasionally positional. But none since the last event).  Negative for headaches.  Endo/Heme/Allergies: Does not bruise/bleed easily.  Psychiatric/Behavioral: Negative for depression and memory loss. The patient is not nervous/anxious.   All other systems reviewed and are negative.   Past Medical History  Diagnosis Date  . History of prostate cancer 2004--  S/P SEED IMPLANTS     NO RECURRENCE  . S/P primary angioplasty with coronary stent 2006--      X2 DE STENTS - Cypher 2.5 mm postdilated to 2.75 mm (20 mm and 13 mm stents)  . Urgency of urination   . Frequency of urination   . H/O: gout STABLE  . Impaired hearing RIGHT HEARING AID  . Hydrocele, left   . History of melanoma excision 2010    FOREHEAD  . CAD S/P percutaneous coronary angioplasty CARDIOLOGIST-  DR [facility] St[facility]    Myoview March 2014: No ischemia or infarction  . Diastolic dysfunction without heart failure     Moderate LVH, Gr 1 DD on Echo 2011; no CHF admissions, no tt on diuretisc  . Hyperlipidemia LDL goal <70   . Essential hypertension   . Colon polyps 05/10/2005    Hyperplastic polyps  . Depression   . OSA on CPAP     bipap  . Heart murmur     hx - no notable valvular lesion on Echo  . Prostate cancer ([place])     prostate , melanoma head  . Arthritis   . Shingles 07  . Gout   . Complication of anesthesia     "takes long time to wake up"  . Diabetes mellitus type 2 with complications (HCC)     neuropathy; CAD borderline- no med  . Syncope and collapse March 2017    Likely related to post micturition, Neurontin and dehydration with orthostatic hypotension    Past Surgical History  Procedure Laterality Date  . Prostate palladium gold seed implants (118)  11-15-2002    PROSTATE CANCER  . Excision melanoma from forehead  2010  . Coronary angioplasty with stent placement  09-01-2004      Cypher DES x 2 d-m LCx 2.5 mm x 20 mm & 2.5 mm x 13 mm (~2.75 mm)  . Transthoracic echocardiogram  MAR [id number]    NORMAL EF > 55% / NORMAL FUNCTION WITH IMPAIRED RELAXATION  AND MODERATE CONCENTRIC LVH LIKELY HYPERTENSIVE DISEASE  . Hydrocele excision  10/14/2011    Procedure: HYDROCELECTOMY ADULT;  Surgeon: Ailene Rud, MD;  [facility]: [facility];  Service: Urology;  Laterality: Left;  . Circumsision    . Laminectomy N/A 07/13/2012    Procedure: THORACIC LAMINECTOMY FOR RESECTION OF INTRAMEDULLARY SPINAL CHORD TUMOR WITH SPINAL CHORD MONITORING;  Surgeon: Erline Levine, MD;  Location: [place] [facility];  Service: Neurosurgery;  Laterality: N/A;  Thoracic laminectomy for resection of intramedullary spinal cord tumor with spinal cord monitering and Dr. Christella Noa to assist  . Nm myoview ltd  3/212014; June 2014    Both Lexiscan: a. LOW RISK, mild  inferior bowel artifact; No ischemia or Infarction; b. Low risk, normal study. No infarction or ischemia. No artifact.  . Eye surgery Bilateral     cataracts  . Tonsillectomy    . Laminectomy N/A 12/09/2014    Procedure: Redo Thoracic laminectomy with intramedullary tumar resection/USN/Cusa/Subarachnoid shunt/spinal cord monitoring/Dr. Kathyrn Sheriff to assist;  Surgeon: Erline Levine, MD;  Location: [place] [facility];  Service: Neurosurgery;  Laterality: N/A;  Redo Thoracic laminectomy with intramedullary tumar resection/USN/Cusa/Subarachnoid shunt/spinal cord monitoring/Dr. Kathyrn Sheriff to assist  . Skin biopsy     Prior to Admission medications   Medication Sig Start Date End Date Taking? Authorizing Provider  allopurinol (ZYLOPRIM) 300 MG tablet Take 1 tablet (300 mg total) by mouth daily. 12/25/14  Yes Daniel J Angiulli, PA-C  amLODipine (NORVASC) 10 MG tablet Take 1 tablet (10 mg total) by mouth daily. 12/25/14  Yes Daniel J Angiulli, PA-C  aspirin EC 81 MG tablet Take 81 mg by mouth daily.   Yes Historical Provider, MD  clopidogrel (PLAVIX) 75 MG tablet Take 1 tablet (75 mg total) by mouth daily. 12/25/14  Yes Daniel J Angiulli, PA-C  escitalopram (LEXAPRO) 10 MG tablet Take 1 tablet (10 mg total) by mouth daily. 12/25/14   Yes Daniel J Angiulli, PA-C  HYDROcodone-acetaminophen (NORCO/VICODIN) 5-325 MG tablet Take 1 tablet by mouth every 6 (six) hours as needed. 05/08/15  Yes Historical Provider, MD  metFORMIN (GLUCOPHAGE) 500 MG tablet TAKE 1 TABLET BY MOUTH TWICE A DAY WITH A MEAL 04/27/15  Yes Charlett Blake, MD  oxybutynin (DITROPAN XL) 15 MG 24 hr tablet Take 1 tablet (15 mg total) by mouth at bedtime. 12/25/14  Yes Daniel J Angiulli, PA-C  rosuvastatin (CRESTOR) 20 MG tablet Take 1 tablet (20 mg total) by mouth daily. 12/25/14  Yes Daniel J Angiulli, PA-C  tamsulosin (FLOMAX) 0.4 MG CAPS capsule Take 1 capsule (0.4 mg total) by mouth at bedtime. 12/25/14  Yes Daniel J Angiulli, PA-C  valsartan (DIOVAN) 80 MG tablet Take 1 tablet (80 mg total) by mouth daily. 12/25/14  Yes Daniel J Angiulli, PA-C  VITAMIN D, CHOLECALCIFEROL, PO Take 1 tablet by mouth daily.    Yes Historical Provider, MD   Allergies  Allergen Reactions  . Betadine [Povidone Iodine] Anaphylaxis  . Contrast Media [Iodinated Diagnostic Agents] Anaphylaxis and Rash    Other Reaction: Other reaction  . Iodine Anaphylaxis    Shellfish, IVP dye. Swelling in throat and foaming at mouth.  Marland Kitchen Shellfish Allergy Anaphylaxis    Social History   Social History  . Marital Status: Married    Spouse Name: N/A  . Number of Children: N/A  . Years of Education: N/A   Social History Main Topics  . Smoking status: Never Smoker   . Smokeless tobacco: Never Used     Comment: occ alcohol  . Alcohol Use: Yes     Comment: OCCASIONAL  . Drug Use: No  . Sexual Activity: Not Asked   Other Topics Concern  . None   Social History Narrative   He is married, father of 2, grandfather of 33. Does not really get exercise now because   of his back issues. Does not smoke and drinks social alcohol. Works in [employer].   family history includes Hypertension in his brother, father, and mother; Prostate cancer in his brother and father; Stroke in his  mother.   Wt Readings from Last 3 Encounters:  08/11/15 210 lb 12.8 oz (95.618 kg)  06/23/15 205 lb (92.987 kg)  06/18/15 199 lb (90.266 kg)    PHYSICAL EXAM BP 132/76 mmHg  Pulse 66  Ht 5' 9.5" (1.765 m)  Wt 210 lb 12.8 oz (95.618 kg)  BMI 30.69 kg/m2 General appearance: alert, cooperative, appears stated age, no distress and mildly obese HEENT: [place]/AT, EOMI, MMM, anicteric sclera Neck: no adenopathy, no carotid bruit and no JVD Lungs: clear to auscultation bilaterally, normal percussion bilaterally and non-labored Heart: regular rate and rhythm - occasional ectopy, S1 & S2 normal, no murmur, click, rub or gallop; nondisplaced PMI Abdomen: soft, non-tender; bowel sounds normal; no masses, no organomegaly;  Extremities: extremities normal, atraumatic, no cyanosis, or edema  Pulses: 2+ and symmetric;  Neurologic: Mental status: Alert, oriented, thought content appropriate    Adult ECG Report n/a   Other studies Reviewed: Additional studies/ records that were reviewed today include:  Recent Labs:   Checked by PCP     ASSESSMENT / PLAN: Problem List Items Addressed This Visit    Syncope, non cardiac - Primary    My suspicion is that this episode was probably related more to some orthostatic hypotension and being on Neurontin. It also happened shortly after urinating. Therefore it could be post micturition syncope.. I instructed to make sure he adequately hydrates. he should see that his urine is clear and I will be indicative of his adequate hydration. I also decided to back off on his amlodipine dose to 5 mg and take that in the evening. He will then take his valsartan in the evening. This will help avoid double effect of medications. Will also allow for some mild permissive hypertension.       Relevant Medications   amLODipine (NORVASC) 10 MG tablet   valsartan (DIOVAN) 80 MG tablet   Hyperlipidemia LDL goal <70 (Chronic)    On Crestor. He is supposed to have his  labs checked by PCP.      Relevant Medications   amLODipine (NORVASC) 10 MG tablet   valsartan (DIOVAN) 80 MG tablet   Finding of multiple premature atrial contractions by electrocardiography (Chronic)    He is PACs and PVCs noted in the past. They cannot dictating of his Myoview last June because of frequent PACs in bigeminy. This was confirmed by his monitor. The remaining short bursts of atrial runs, but not symptomatic. This probably didn't not have anything to do with his syncope as he denied any sensation of rapid irregular heartbeats or palpitations prior to his syncope. For now I think if not symptomatic vomiting Caryl Comes to not try to treat with beta blocker or non-dihydropyridine calcium channel blocker      Essential hypertension (Chronic)    At this point, would refer to have a little permissive hypertension based on his recent syncopal episode. I've asked him back off on his amlodipine dose and have split the ARB and calcium channel blocker to one the morning when the evening.      Relevant Medications   amLODipine (NORVASC) 10 MG tablet   valsartan (DIOVAN) 80 MG tablet   CAD S/P percutaneous coronary angioplasty: Cypher DES x2 to circumflex (Chronic)    No anginal symptoms at rest or exertion. Negative Myoview last June. He remains on Plavix, but I think we can stop aspirin. He is not on beta blocker due to bradycardia and fatigue. He remains on ARB and calcium channel blocker.      Relevant Medications   amLODipine (NORVASC) 10 MG tablet   valsartan (DIOVAN) 80 MG tablet  Current medicines are reviewed at length with the patient today. (+/- concerns) Not sure about Neurontin The following changes have been made:  Take 1/2 tablet of  10 MG AMLODIPINE IN THE MORNING ,  START TAKING VALSARTAN IN THE EVENING.   NO OTHER CHANGES WITH CURRENT MEDICATIONS   Your physician wants you to follow-up in  SEPT 2017 WITH DR Chalee Hirota--30 MIN   Studies Ordered:   No orders  of the defined types were placed in this encounter.      Leonie Man, M.D., M.S. Interventional Cardiologist   Pager # [phone] Phone # [phone] [address] [place], [place] [address]

## 2015-08-12 ENCOUNTER — Encounter: Payer: Self-pay | Admitting: Cardiology

## 2015-08-12 DIAGNOSIS — I491 Atrial premature depolarization: Secondary | ICD-10-CM | POA: Insufficient documentation

## 2015-08-12 DIAGNOSIS — R55 Syncope and collapse: Secondary | ICD-10-CM | POA: Insufficient documentation

## 2015-08-12 NOTE — Assessment & Plan Note (Signed)
On Crestor. He is supposed to have his labs checked by PCP.

## 2015-08-12 NOTE — Assessment & Plan Note (Signed)
My suspicion is that this episode was probably related more to some orthostatic hypotension and being on Neurontin. It also happened shortly after urinating. Therefore it could be post micturition syncope.. I instructed to make sure he adequately hydrates. he should see that his urine is clear and I will be indicative of his adequate hydration. I also decided to back off on his amlodipine dose to 5 mg and take that in the evening. He will then take his valsartan in the evening. This will help avoid double effect of medications. Will also allow for some mild permissive hypertension.

## 2015-08-12 NOTE — Assessment & Plan Note (Signed)
He is PACs and PVCs noted in the past. They cannot dictating of his Myoview last June because of frequent PACs in bigeminy. This was confirmed by his monitor. The remaining short bursts of atrial runs, but not symptomatic. This probably didn't not have anything to do with his syncope as he denied any sensation of rapid irregular heartbeats or palpitations prior to his syncope. For now I think if not symptomatic vomiting Caryl Comes to not try to treat with beta blocker or non-dihydropyridine calcium channel blocker

## 2015-08-12 NOTE — Assessment & Plan Note (Signed)
No anginal symptoms at rest or exertion. Negative Myoview last June. He remains on Plavix, but I think we can stop aspirin. He is not on beta blocker due to bradycardia and fatigue. He remains on ARB and calcium channel blocker.

## 2015-08-12 NOTE — Assessment & Plan Note (Signed)
At this point, would refer to have a little permissive hypertension based on his recent syncopal episode. I've asked him back off on his amlodipine dose and have split the ARB and calcium channel blocker to one the morning when the evening.

## 2015-08-25 DIAGNOSIS — F324 Major depressive disorder, single episode, in partial remission: Secondary | ICD-10-CM | POA: Diagnosis not present

## 2015-08-25 DIAGNOSIS — E119 Type 2 diabetes mellitus without complications: Secondary | ICD-10-CM | POA: Diagnosis not present

## 2015-08-25 DIAGNOSIS — I10 Essential (primary) hypertension: Secondary | ICD-10-CM | POA: Diagnosis not present

## 2015-08-25 DIAGNOSIS — Z7984 Long term (current) use of oral hypoglycemic drugs: Secondary | ICD-10-CM | POA: Diagnosis not present

## 2015-09-29 DIAGNOSIS — L821 Other seborrheic keratosis: Secondary | ICD-10-CM | POA: Diagnosis not present

## 2015-09-29 DIAGNOSIS — L570 Actinic keratosis: Secondary | ICD-10-CM | POA: Diagnosis not present

## 2015-09-29 DIAGNOSIS — D225 Melanocytic nevi of trunk: Secondary | ICD-10-CM | POA: Diagnosis not present

## 2015-09-29 DIAGNOSIS — D485 Neoplasm of uncertain behavior of skin: Secondary | ICD-10-CM | POA: Diagnosis not present

## 2015-09-29 DIAGNOSIS — C44519 Basal cell carcinoma of skin of other part of trunk: Secondary | ICD-10-CM | POA: Diagnosis not present

## 2015-09-29 DIAGNOSIS — D1801 Hemangioma of skin and subcutaneous tissue: Secondary | ICD-10-CM | POA: Diagnosis not present

## 2015-10-13 ENCOUNTER — Ambulatory Visit: Payer: Medicare Other | Admitting: Physical Medicine & Rehabilitation

## 2015-10-19 ENCOUNTER — Encounter: Payer: Medicare Other | Admitting: Physical Medicine & Rehabilitation

## 2015-11-23 ENCOUNTER — Encounter: Payer: Medicare Other | Attending: Physical Medicine & Rehabilitation | Admitting: Physical Medicine & Rehabilitation

## 2015-11-23 ENCOUNTER — Encounter: Payer: Self-pay | Admitting: Physical Medicine & Rehabilitation

## 2015-11-23 VITALS — BP 129/78 | HR 60

## 2015-11-23 DIAGNOSIS — Z79899 Other long term (current) drug therapy: Secondary | ICD-10-CM | POA: Insufficient documentation

## 2015-11-23 DIAGNOSIS — Z8546 Personal history of malignant neoplasm of prostate: Secondary | ICD-10-CM | POA: Diagnosis not present

## 2015-11-23 DIAGNOSIS — E1169 Type 2 diabetes mellitus with other specified complication: Secondary | ICD-10-CM

## 2015-11-23 DIAGNOSIS — Z9861 Coronary angioplasty status: Secondary | ICD-10-CM

## 2015-11-23 DIAGNOSIS — G8222 Paraplegia, incomplete: Secondary | ICD-10-CM | POA: Insufficient documentation

## 2015-11-23 DIAGNOSIS — E1165 Type 2 diabetes mellitus with hyperglycemia: Secondary | ICD-10-CM

## 2015-11-23 DIAGNOSIS — I10 Essential (primary) hypertension: Secondary | ICD-10-CM | POA: Diagnosis not present

## 2015-11-23 DIAGNOSIS — E119 Type 2 diabetes mellitus without complications: Secondary | ICD-10-CM | POA: Diagnosis not present

## 2015-11-23 DIAGNOSIS — I2510 Atherosclerotic heart disease of native coronary artery without angina pectoris: Secondary | ICD-10-CM

## 2015-11-23 DIAGNOSIS — E785 Hyperlipidemia, unspecified: Secondary | ICD-10-CM | POA: Insufficient documentation

## 2015-11-23 DIAGNOSIS — IMO0002 Reserved for concepts with insufficient information to code with codable children: Secondary | ICD-10-CM

## 2015-11-23 NOTE — Progress Notes (Signed)
Subjective:    Patient ID: [name], male    DOB: 1936-03-05, 80 y.o.   MRN: [id number]  HPI   [name] is here in follow up of his thoracic myelopathy. He received the adaptive attachments to his car and went through the training. He has a left footed pedal in his car. The process was extensive.   He still moves too fast but he hasn't had any falls. He tells me his sugars have been under control, but he only check the AM sugar currently. He asks why he has lost hair at the bottom of his legs.     Pain Inventory Average Pain 0 Pain Right Now 0 My pain is no pain  In the last 24 hours, has pain interfered with the following? General activity 0 Relation with others 0 Enjoyment of life 0 What TIME of day is your pain at its worst? na Sleep (in general) NA  Pain is worse with: na Pain improves with: na Relief from Meds: na  Mobility walk with assistance use a cane how many minutes can you walk? 30 ability to climb steps?  yes do you drive?  yes Do you have any goals in this area?  no  Function employed # of hrs/week 40 Do you have any goals in this area?  no  Neuro/Psych numbness trouble walking depression  Prior Studies Any changes since last visit?  no  Physicians involved in your care Any changes since last visit?  yes   Family History  Problem Relation Age of Onset  . Hypertension Mother   . Stroke Mother   . Hypertension Father   . Prostate cancer Father   . Hypertension Brother   . Prostate cancer Brother    Social History   Social History  . Marital status: Married    Spouse name: N/A  . Number of children: N/A  . Years of education: N/A   Social History Main Topics  . Smoking status: Never Smoker  . Smokeless tobacco: Never Used     Comment: occ alcohol  . Alcohol use Yes     Comment: OCCASIONAL  . Drug use: No  . Sexual activity: Not Asked   Other Topics Concern  . None   Social History Narrative   He is married,  father of 2, grandfather of 29. Does not really get exercise now because   of his back issues. Does not smoke and drinks social alcohol. Works in [employer].   Past Surgical History:  Procedure Laterality Date  . circumsision    . CORONARY ANGIOPLASTY WITH STENT PLACEMENT  09-01-2004     Cypher DES x 2 d-m LCx 2.5 mm x 20 mm & 2.5 mm x 13 mm (~2.75 mm)  . EXCISION MELANOMA FROM FOREHEAD  2010  . EYE SURGERY Bilateral    cataracts  . HYDROCELE EXCISION  10/14/2011   Procedure: HYDROCELECTOMY ADULT;  Surgeon: Ailene Rud, MD;  Location[facility];  Service: Urology;  Laterality: Left;  . LAMINECTOMY N/A 07/13/2012   Procedure: THORACIC LAMINECTOMY FOR RESECTION OF INTRAMEDULLARY SPINAL CHORD TUMOR WITH SPINAL CHORD MONITORING;  Surgeon: Erline Levine, MD;  Location: [place] [facility];  Service: Neurosurgery;  Laterality: N/A;  Thoracic laminectomy for resection of intramedullary spinal cord tumor with spinal cord monitering and Dr. Christella Noa to assist  . LAMINECTOMY N/A 12/09/2014   Procedure: Redo Thoracic laminectomy with intramedullary tumar resection/USN/Cusa/Subarachnoid shunt/spinal cord monitoring/Dr. Kathyrn Sheriff to assist;  Surgeon: Erline Levine,  MD;  Location: [place] [facility];  Service: Neurosurgery;  Laterality: N/A;  Redo Thoracic laminectomy with intramedullary tumar resection/USN/Cusa/Subarachnoid shunt/spinal cord monitoring/Dr. Kathyrn Sheriff to assist  . NM MYOVIEW LTD  3/212014; June 2014   Both Lexiscan: a. LOW RISK, mild inferior bowel artifact; No ischemia or Infarction; b. Low risk, normal study. No infarction or ischemia. No artifact.  Marland Kitchen PROSTATE PALLADIUM GOLD SEED IMPLANTS (118)  11-15-2002   PROSTATE CANCER  . SKIN BIOPSY    . TONSILLECTOMY    . TRANSTHORACIC ECHOCARDIOGRAM  MAR 07,2011   NORMAL EF > 55% / NORMAL FUNCTION WITH IMPAIRED RELAXATION AND MODERATE CONCENTRIC LVH LIKELY HYPERTENSIVE DISEASE   Past Medical History:  Diagnosis Date  . Arthritis   .  CAD S/P percutaneous coronary angioplasty CARDIOLOGIST-  DR DAVID [facility]   Myoview March 2014: No ischemia or infarction  . Colon polyps 05/10/2005   Hyperplastic polyps  . Complication of anesthesia    "takes long time to wake up"  . Depression   . Diabetes mellitus type 2 with complications (HCC)    neuropathy; CAD borderline- no med  . Diastolic dysfunction without heart failure    Moderate LVH, Gr 1 DD on Echo 2011; no CHF admissions, no tt on diuretisc  . Essential hypertension   . Frequency of urination   . Gout   . H/O: gout STABLE  . Heart murmur    hx - no notable valvular lesion on Echo  . History of melanoma excision 2010   FOREHEAD  . History of prostate cancer 2004--  S/P SEED IMPLANTS    NO RECURRENCE  . Hydrocele, left   . Hyperlipidemia LDL goal <70   . Impaired hearing RIGHT HEARING AID  . OSA on CPAP    bipap  . Prostate cancer ([place])    prostate , melanoma head  . S/P primary angioplasty with coronary stent 2006--     X2 DE STENTS - Cypher 2.5 mm postdilated to 2.75 mm (20 mm and 13 mm stents)  . Shingles 07  . Syncope and collapse March 2017   Likely related to post micturition, Neurontin and dehydration with orthostatic hypotension  . Urgency of urination    BP 129/78   Pulse 60   SpO2 98%   Opioid Risk Score:   Fall Risk Score:  `1  Depression screen PHQ 2/9  Depression screen [facility] 2/9 02/03/2015 08/18/2013  Decreased Interest 0 0  Down, Depressed, Hopeless 1 0  PHQ - 2 Score 1 0    Review of Systems  Constitutional: Negative.   HENT: Negative.   Eyes: Negative.   Respiratory: Negative.   Gastrointestinal: Negative.   Endocrine: Negative.   Genitourinary: Negative.   Musculoskeletal: Negative.   Skin: Negative.   Allergic/Immunologic: Negative.   Neurological: Positive for numbness.  Hematological: Negative.   Psychiatric/Behavioral: Positive for dysphoric mood.       Objective:   Physical Exam Constitutional: He is oriented to  person, place, and time. He appears well-developed.  HENT: oral mucosa pink and moist  Head: Normocephalic.  Eyes: EOM are normal.  Neck: Normal range of motion. Neck supple. No thyromegaly present.  Cardiovascular: Normal rate and regular rhythm. rare pac if any today  Respiratory: Effort normal and breath sounds normal. No respiratory distress. No wheezes or rales  GI: Soft. Bowel sounds are normal. He exhibits no distension.  Neurological: He is alert and oriented to person, place, and time.  Continued diminished LT/PP in bilateral lower extremities from  upper thigh and distally. No symptoms in hands.  Decreased gross coordination of both legs, right more than left but improved.. RLE, : 4/5 hf, 4ke and 4- adf/apf. LLE: 4/5 hf, 4ke and 4-adf/apf. Bilateral UE: 5/5 deltoid, bicep, tricep, wrist, HI. Cognitively he's appropriate with normal insight and awareness. he remains a bit impulsive at [facility] very fast once again.Marland Kitchen   Psychiatric: He has a normal mood and affect. His behavior is normal.. Generally playful at times.  Assessment/Plan:   1. 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  -improved mobility---walking with cane -left foot pedal control for car 2 Pain Management: off all pain meds.  4. Mood: Lexapro 10 mg daily to continue  5 Hypertension. Per primary  9. Constipation/neurogenic bowel: continue bowel program  10. Hyperlipidemia. Crestor  11. History of gout. Allopurinol 300 mg daily. no flareups   12. BPH with history of prostate cancer/hematuria  -emptying fairly frequently near baseline  13. CAD-per cadiology  14. DM--discussed the importance of tight glucose control  -daily skin inspectiondiscussed  -he has diabetic peripheral neuropathy which is also accounting for his lower ext sensory symptoms   Follow up with me PRN . 15 minutes of face to face patient care time were spent during this  visit. All questions were encouraged and answered.

## 2015-11-23 NOTE — Patient Instructions (Signed)
CONTINUE TO KEEP YOUR SUGARS UNDER CONTROL.   PLEASE CALL ME WITH ANY PROBLEMS OR QUESTIONS [phone])

## 2015-12-14 DIAGNOSIS — J209 Acute bronchitis, unspecified: Secondary | ICD-10-CM | POA: Diagnosis not present

## 2015-12-14 DIAGNOSIS — I10 Essential (primary) hypertension: Secondary | ICD-10-CM | POA: Diagnosis not present

## 2015-12-15 ENCOUNTER — Ambulatory Visit: Payer: Medicare Other | Admitting: Cardiology

## 2015-12-31 DIAGNOSIS — Z7984 Long term (current) use of oral hypoglycemic drugs: Secondary | ICD-10-CM | POA: Diagnosis not present

## 2015-12-31 DIAGNOSIS — Z23 Encounter for immunization: Secondary | ICD-10-CM | POA: Diagnosis not present

## 2015-12-31 DIAGNOSIS — E119 Type 2 diabetes mellitus without complications: Secondary | ICD-10-CM | POA: Diagnosis not present

## 2015-12-31 DIAGNOSIS — I10 Essential (primary) hypertension: Secondary | ICD-10-CM | POA: Diagnosis not present

## 2016-01-06 ENCOUNTER — Encounter: Payer: Self-pay | Admitting: Cardiology

## 2016-01-06 ENCOUNTER — Ambulatory Visit (INDEPENDENT_AMBULATORY_CARE_PROVIDER_SITE_OTHER): Payer: Medicare Other | Admitting: Cardiology

## 2016-01-06 DIAGNOSIS — Z9989 Dependence on other enabling machines and devices: Secondary | ICD-10-CM

## 2016-01-06 DIAGNOSIS — I491 Atrial premature depolarization: Secondary | ICD-10-CM

## 2016-01-06 DIAGNOSIS — I10 Essential (primary) hypertension: Secondary | ICD-10-CM

## 2016-01-06 DIAGNOSIS — Z9861 Coronary angioplasty status: Secondary | ICD-10-CM

## 2016-01-06 DIAGNOSIS — E785 Hyperlipidemia, unspecified: Secondary | ICD-10-CM

## 2016-01-06 DIAGNOSIS — R55 Syncope and collapse: Secondary | ICD-10-CM | POA: Diagnosis not present

## 2016-01-06 DIAGNOSIS — I2510 Atherosclerotic heart disease of native coronary artery without angina pectoris: Secondary | ICD-10-CM | POA: Diagnosis not present

## 2016-01-06 DIAGNOSIS — E669 Obesity, unspecified: Secondary | ICD-10-CM

## 2016-01-06 DIAGNOSIS — G4733 Obstructive sleep apnea (adult) (pediatric): Secondary | ICD-10-CM

## 2016-01-06 NOTE — Progress Notes (Signed)
PCP: Irven Shelling, MD  Clinic Note: Chief Complaint  Patient presents with  . Follow-up    cad    HPI: [name] is a 80 y.o. male with a PMH below who presents today for ~1 month f/u. He is a long-term patient of mine who has h/o CAD-PCI back in 2006. At the time of his last visit with me in June 2016, he was in the process of potentially having re-do spinal Surgery for a non-malignant tumor.  KIMMY PARISH was last seen may 2017 - f/u from visit for Syncope.  Had seen Almyra Deforest, PA on March 21st following an episode of LOC with fall -- thought to be related to Neurontin.  Recent Hospitalizations: None since last visit.  Studies Reviewed: None  Interval History:  Orva presents today doing quite well overall from a cardiac standpoint with no major complaints. He has not had any further syncopal episodes from. He still has numbness in his feet from his back surgery, but is able to walk with a cane now. He really isn't doing that much the way of any particular exercise and is somewhat inactive. For what he is able do, he denies any resting or exertional chest tightness or pressure. No resting or exertional dyspnea. Is trying to get back active and is in the process of hiring a Physiological scientist. He is still working, so he is getting around some.  He no longer uses CPAP  Otherwise from a strictly cardiac standpoint, he has been relatively asymptomatic. Review of symptoms as follows:  No chest pain or shortness of breath with rest or exertion.  No PND, orthopnea or edema.  No palpitations  and no further episodes of lightheadedness, dizziness, weakness or syncope/near syncope. No TIA/amaurosis fugax symptoms. No claudication.  He admits to dietary indiscretion yesterday. Expects that his blood pressure is probably little bit higher.  ROS: A comprehensive was performed. Review of Systems  Constitutional: Positive for malaise/fatigue (Deconditioned - but less  tired). Negative for weight loss (Actually gained weight).  HENT: Negative for congestion and nosebleeds.   Respiratory: Negative for cough, shortness of breath and wheezing.   Cardiovascular: Negative.        Per history of present illness  Gastrointestinal: Negative for blood in stool, heartburn and melena.  Genitourinary: Negative for hematuria.  Musculoskeletal: Negative for myalgias (Right leg).       He basically has a combination of neuropathic and arthritis pain in the right leg from hip down.  Neurological: Positive for dizziness (Occasionally positional. But none since the last event). Negative for headaches.  Endo/Heme/Allergies: Does not bruise/bleed easily.  Psychiatric/Behavioral: Negative for depression and memory loss. The patient is not nervous/anxious.   All other systems reviewed and are negative.   Past Medical History:  Diagnosis Date  . Arthritis   . CAD S/P percutaneous coronary angioplasty 08/2004   CARDIOLOGIST-  DR Forest Pruden; DES to LCx. X2  - Cypher 2.5 mm postdilated to 2.75 mm (20 mm and 13 mm stents);; Myoview 09/2014: LOW RISK. No Infarct or Ischemia.  . Colon polyps 05/10/2005   Hyperplastic polyps  . Complication of anesthesia    "takes long time to wake up"  . Depression   . Diabetes mellitus type 2 with complications (HCC)    neuropathy; CAD borderline- no med  . Diastolic dysfunction without heart failure    Moderate LVH, Gr 1 DD on Echo 2011; no CHF admissions, no tt on diuretisc  . Essential  hypertension   . Frequency of urination   . Gout   . H/O: gout STABLE  . Heart murmur    hx - no notable valvular lesion on Echo  . History of melanoma excision 2010   FOREHEAD  . History of prostate cancer 2004--  S/P SEED IMPLANTS    NO RECURRENCE  . Hydrocele, left   . Hyperlipidemia LDL goal <70   . Impaired hearing RIGHT HEARING AID  . OSA on CPAP    bipap  . Prostate cancer ([place])    prostate , melanoma head  . Shingles 07  . Syncope and  collapse March 2017   Likely related to post micturition, Neurontin and dehydration with orthostatic hypotension  . Urgency of urination     Past Surgical History:  Procedure Laterality Date  . Cardiac Event Monitor  3-07/2015   Mostly SR - 58-132 bpm. Rare PVCs (occ bigeminy), Frequent PACs - singlets, couplets - short runs of PAT.  . circumsision    . CORONARY ANGIOPLASTY WITH STENT PLACEMENT  09-01-2004     Cypher DES x 2 d-m LCx 2.5 mm x 20 mm & 2.5 mm x 13 mm (~2.75 mm)  . EXCISION MELANOMA FROM FOREHEAD  2010  . EYE SURGERY Bilateral    cataracts  . HYDROCELE EXCISION  10/14/2011   Procedure: HYDROCELECTOMY ADULT;  Surgeon: Ailene Rud, MD;  Location: [facility];  Service: Urology;  Laterality: Left;  . LAMINECTOMY N/A 07/13/2012   Procedure: THORACIC LAMINECTOMY FOR RESECTION OF INTRAMEDULLARY SPINAL CHORD TUMOR WITH SPINAL CHORD MONITORING;  Surgeon: Erline Levine, MD;  Location: [place] [facility];  Service: Neurosurgery;  Laterality: N/A;  Thoracic laminectomy for resection of intramedullary spinal cord tumor with spinal cord monitering and Dr. Christella Noa to assist  . LAMINECTOMY N/A 12/09/2014   Procedure: Redo Thoracic laminectomy with intramedullary tumar resection/USN/[place]/Subarachnoid shunt/spinal cord monitoring/Dr. Kathyrn Sheriff to assist;  Surgeon: Erline Levine, MD;  Location: [place] [facility];  Service: Neurosurgery;  Laterality: N/A;  Redo Thoracic laminectomy with intramedullary tumar resection/USN/[place]/Subarachnoid shunt/spinal cord monitoring/Dr. Kathyrn Sheriff to assist  . NM MYOVIEW LTD  3/212014; June 2016   Both Lexiscan: a. LOW RISK, mild inferior bowel artifact; No ischemia or Infarction; b. Low risk, normal study. No infarction or ischemia. No artifact.  Marland Kitchen PROSTATE PALLADIUM GOLD SEED IMPLANTS (118)  11-15-2002   PROSTATE CANCER  . SKIN BIOPSY    . TONSILLECTOMY    . TRANSTHORACIC ECHOCARDIOGRAM  MAR 07,2011   NORMAL EF > 55% / NORMAL FUNCTION WITH IMPAIRED  RELAXATION AND MODERATE CONCENTRIC LVH LIKELY HYPERTENSIVE DISEASE    Prior to Admission medications   Medication Sig Start Date Taking? Authorizing Provider  allopurinol (ZYLOPRIM) 300 MG tablet Take 1 tablet (300 mg total) by mouth daily. 12/25/14 Yes Daniel J Angiulli, PA-C  amLODipine (NORVASC) 10 MG tablet Take 10 mg by mouth daily. 10/21/15 Yes Historical Provider, MD  clopidogrel (PLAVIX) 75 MG tablet Take 1 tablet (75 mg total) by mouth daily. 12/25/14 Yes Daniel J Angiulli, PA-C  escitalopram (LEXAPRO) 20 MG tablet Take 20 mg by mouth daily. 10/21/15 Yes Historical Provider, MD  metFORMIN (GLUCOPHAGE) 500 MG tablet TAKE 1 TABLET BY MOUTH TWICE A DAY WITH A MEAL 04/27/15 Yes Charlett Blake, MD  oxybutynin (DITROPAN XL) 15 MG 24 hr tablet Take 1 tablet (15 mg total) by mouth at bedtime. 12/25/14 Yes Daniel J Angiulli, PA-C  rosuvastatin (CRESTOR) 20 MG tablet Take 1 tablet (20 mg total) by mouth daily. 12/25/14  Yes Lavon Paganini Angiulli, PA-C  tamsulosin (FLOMAX) 0.4 MG CAPS capsule Take 1 capsule (0.4 mg total) by mouth at bedtime. 12/25/14 Yes Daniel J Angiulli, PA-C  valsartan (DIOVAN) 80 MG tablet Take 1 tablet (80 mg total) by mouth every evening. 08/11/15 Yes Leonie Man, MD  VITAMIN D, CHOLECALCIFEROL, PO Take 1 tablet by mouth daily.   Yes Historical Provider, MD    Allergies  Allergen Reactions  . Betadine [Povidone Iodine] Anaphylaxis  . Contrast Media [Iodinated Diagnostic Agents] Anaphylaxis and Rash    Other Reaction: Other reaction  . Iodine Anaphylaxis    Shellfish, IVP dye. Swelling in throat and foaming at mouth.  Marland Kitchen Shellfish Allergy Anaphylaxis    Social History   Social History  . Marital status: Married    Spouse name: N/A  . Number of children: N/A  . Years of education: N/A   Social History Main Topics  . Smoking status: Never Smoker  . Smokeless tobacco: Never Used     Comment: occ alcohol  . Alcohol use Yes     Comment: OCCASIONAL  . Drug use: No  .  Sexual activity: Not Asked   Other Topics Concern  . None   Social History Narrative   He is married, father of 2, grandfather of 41. Does not really get exercise now because   of his back issues. Does not smoke and drinks social alcohol. Works in [employer].   family history includes Hypertension in his brother, father, and mother; Prostate cancer in his brother and father; Stroke in his mother.   Wt Readings from Last 3 Encounters:  01/06/16 94.3 kg (208 lb)  08/11/15 95.6 kg (210 lb 12.8 oz)  06/23/15 93 kg (205 lb)    PHYSICAL EXAM BP (!) 150/80   Pulse (!) 48   Ht 5' 9.5" (1.765 m)   Wt 94.3 kg (208 lb)   BMI 30.28 kg/m   -  @ home 130s/70s  mmHg General appearance: alert, cooperative, appears stated age, no distress and mildly obese HEENT: Waves/AT, EOMI, MMM, anicteric sclera Neck: no adenopathy, no carotid bruit and no JVD Lungs: clear to auscultation bilaterally, normal percussion bilaterally and non-labored Heart: regular rate and rhythm - occasional ectopy, S1 & S2 normal, no murmur, click, rub or gallop; nondisplaced PMI Abdomen: soft, non-tender; bowel sounds normal; no masses, no organomegaly;  Extremities: extremities normal, atraumatic, no cyanosis, or edema  Pulses: 2+ and symmetric;  Neurologic: Mental status: Alert, oriented, thought content appropriate    Adult ECG Report n/a   Other studies Reviewed: Additional studies/ records that were reviewed today include:  Recent Labs:   Checked by PCP     ASSESSMENT / PLAN: Problem List Items Addressed This Visit    Syncope, non cardiac    No further episodes. I do think this was probably related to Neurontin. However I would also be more likely to suggest permissive hypertension.      Relevant Medications   amLODipine (NORVASC) 10 MG tablet   OSA on CPAP (Chronic)    Unfortunately, no longer using CPAP. Seems to be sleeping okay without significant snoring.  I recommended that he maintain  trying to use it at least part of the night.      Obesity (BMI 30-39.9) (Chronic)    He had been doing well with weight loss. Gained some back but is now starting to lose it again. Hopefully with working with a physical trainer he will lose some weight back. Discussed  dietary adjustment as well.      Hyperlipidemia LDL goal <70 (Chronic)    On Crestor. By his report PCP monitors. Last evaluation was "within limits", according to his report. No myalgias on Crestor. Would like to have labs from PCP.      Relevant Medications   amLODipine (NORVASC) 10 MG tablet   Finding of multiple premature atrial contractions by electrocardiography (Chronic)    He did have ectopy noted on exam. He doesn't feel them. No true arrhythmias.  He has resting bradycardia, therefore I would not want to use a beta blocker. As long as he isn't symptomatic, would not treat.      Relevant Medications   amLODipine (NORVASC) 10 MG tablet   Essential hypertension (Chronic)    High today, but at home usually well controlled. He did admit to dietary indiscretion last night.  An leery of increasing medications, but the next best option would be to increase Diovan.      Relevant Medications   amLODipine (NORVASC) 10 MG tablet   CAD S/P percutaneous coronary angioplasty: Cypher DES x2 to circumflex (Chronic)    Remains asymptomatic with no recurrent anginal or heart failure symptoms. Negative Myoview June 2016.   Still on Lasix, no aspirin. - Referred to maintain coverage with Cypher DES stents.  Not on beta blocker due to bradycardia/fatigue  On amlodipine for blood pressure and antianginal effect.  On steady dose of ARB  On statin, no myalgias      Relevant Medications   amLODipine (NORVASC) 10 MG tablet    Other Visit Diagnoses   None.     Current medicines are reviewed at length with the patient today. (+/- concerns) Not sure about Neurontin The following changes have been made:  Your  physician discussed the importance of regular exercise and recommended that you start or continue a regular exercise program for good health.  NO CHANGES WITH CURRENT MEDICATIONS   Your physician wants you to follow-up in: 12 MONTHS WITH DR Gennie Eisinger -30 MIN   Studies Ordered:   No orders of the defined types were placed in this encounter.     Glenetta Hew, M.D., M.S. Interventional Cardiologist   Pager # [phone] Phone # [phone] [address] [place], [place] [address]

## 2016-01-06 NOTE — Patient Instructions (Signed)
Your physician discussed the importance of regular exercise and recommended that you start or continue a regular exercise program for good health.  NO CHANGES WITH CURRENT MEDICATIONS   Your physician wants you to follow-up in: [place] -30 MIN You will receive a reminder letter in the mail two months in advance. If you don't receive a letter, please call our office to schedule the follow-up appointment.   If you need a refill on your cardiac medications before your next appointment, please call your pharmacy.

## 2016-01-07 ENCOUNTER — Encounter: Payer: Self-pay | Admitting: Cardiology

## 2016-01-07 NOTE — Assessment & Plan Note (Signed)
High today, but at home usually well controlled. He did admit to dietary indiscretion last night.  An leery of increasing medications, but the next best option would be to increase Diovan.

## 2016-01-07 NOTE — Assessment & Plan Note (Signed)
No further episodes. I do think this was probably related to Neurontin. However I would also be more likely to suggest permissive hypertension.

## 2016-01-07 NOTE — Assessment & Plan Note (Signed)
Unfortunately, no longer using CPAP. Seems to be sleeping okay without significant snoring.  I recommended that he maintain trying to use it at least part of the night.

## 2016-01-07 NOTE — Assessment & Plan Note (Signed)
He had been doing well with weight loss. Gained some back but is now starting to lose it again. Hopefully with working with a physical trainer he will lose some weight back. Discussed dietary adjustment as well.

## 2016-01-07 NOTE — Assessment & Plan Note (Signed)
He did have ectopy noted on exam. He doesn't feel them. No true arrhythmias.  He has resting bradycardia, therefore I would not want to use a beta blocker. As long as he isn't symptomatic, would not treat.

## 2016-01-07 NOTE — Assessment & Plan Note (Signed)
On Crestor. By his report PCP monitors. Last evaluation was "within limits", according to his report. No myalgias on Crestor. Would like to have labs from PCP.

## 2016-01-07 NOTE — Assessment & Plan Note (Signed)
Remains asymptomatic with no recurrent anginal or heart failure symptoms. Negative Myoview June 2016.   Still on Lasix, no aspirin. - Referred to maintain coverage with Cypher DES stents.  Not on beta blocker due to bradycardia/fatigue  On amlodipine for blood pressure and antianginal effect.  On steady dose of ARB  On statin, no myalgias

## 2016-02-12 ENCOUNTER — Other Ambulatory Visit: Payer: Self-pay | Admitting: Neurosurgery

## 2016-02-12 DIAGNOSIS — D497 Neoplasm of unspecified behavior of endocrine glands and other parts of nervous system: Secondary | ICD-10-CM

## 2016-02-23 ENCOUNTER — Ambulatory Visit
Admission: RE | Admit: 2016-02-23 | Discharge: 2016-02-23 | Disposition: A | Discharge status: Home / Self Care | Payer: Medicare Other | Source: Ambulatory Visit | Attending: Neurosurgery | Admitting: Neurosurgery

## 2016-02-23 DIAGNOSIS — D497 Neoplasm of unspecified behavior of endocrine glands and other parts of nervous system: Secondary | ICD-10-CM

## 2016-02-23 DIAGNOSIS — M546 Pain in thoracic spine: Secondary | ICD-10-CM | POA: Diagnosis not present

## 2016-02-23 MED ORDER — GADOBENATE DIMEGLUMINE 529 MG/ML IV SOLN
19.0000 mL | Freq: Once | INTRAVENOUS | Status: AC | PRN
  Administered 2016-02-23: 19 mL via INTRAVENOUS

## 2016-04-26 ENCOUNTER — Telehealth: Payer: Self-pay | Admitting: *Deleted

## 2016-04-26 NOTE — Telephone Encounter (Signed)
Mr Kallal called and he has a new car and he needs help getting set up for hand controls.  He says Dr Naaman Plummer needs to help with this setup which will be done in [facility].

## 2016-04-28 DIAGNOSIS — Z0000 Encounter for general adult medical examination without abnormal findings: Secondary | ICD-10-CM | POA: Diagnosis not present

## 2016-04-28 DIAGNOSIS — E119 Type 2 diabetes mellitus without complications: Secondary | ICD-10-CM | POA: Diagnosis not present

## 2016-04-28 DIAGNOSIS — F329 Major depressive disorder, single episode, unspecified: Secondary | ICD-10-CM | POA: Diagnosis not present

## 2016-04-28 DIAGNOSIS — F325 Major depressive disorder, single episode, in full remission: Secondary | ICD-10-CM | POA: Diagnosis not present

## 2016-04-28 DIAGNOSIS — I10 Essential (primary) hypertension: Secondary | ICD-10-CM | POA: Diagnosis not present

## 2016-04-28 DIAGNOSIS — M109 Gout, unspecified: Secondary | ICD-10-CM | POA: Diagnosis not present

## 2016-04-28 DIAGNOSIS — E782 Mixed hyperlipidemia: Secondary | ICD-10-CM | POA: Diagnosis not present

## 2016-04-28 DIAGNOSIS — Z1389 Encounter for screening for other disorder: Secondary | ICD-10-CM | POA: Diagnosis not present

## 2016-04-28 NOTE — Telephone Encounter (Signed)
Patient called again, says he has not heard back from [place].  Please call patient for details

## 2016-04-28 NOTE — Telephone Encounter (Signed)
What exactly does he need [place] to do? Car dealers don't take prescriptions from MD's. If there is something I can do, let me know

## 2016-05-02 NOTE — Telephone Encounter (Signed)
Mr Matheson says that he needs a letter to "whom it may concern" that he needs to attend training in [facility] so that he may take his car to [employer] to have hand controls installed.

## 2016-05-02 NOTE — Telephone Encounter (Signed)
Letter written

## 2016-05-02 NOTE — Telephone Encounter (Signed)
Left message requesting he call the office so that we may find out exactly what he is asking for.

## 2016-09-01 DIAGNOSIS — E1165 Type 2 diabetes mellitus with hyperglycemia: Secondary | ICD-10-CM | POA: Diagnosis not present

## 2016-09-01 DIAGNOSIS — C720 Malignant neoplasm of spinal cord: Secondary | ICD-10-CM | POA: Diagnosis not present

## 2016-09-01 DIAGNOSIS — G4733 Obstructive sleep apnea (adult) (pediatric): Secondary | ICD-10-CM | POA: Diagnosis not present

## 2016-09-01 DIAGNOSIS — N3281 Overactive bladder: Secondary | ICD-10-CM | POA: Diagnosis not present

## 2016-09-01 DIAGNOSIS — E119 Type 2 diabetes mellitus without complications: Secondary | ICD-10-CM | POA: Diagnosis not present

## 2016-09-01 DIAGNOSIS — Z7984 Long term (current) use of oral hypoglycemic drugs: Secondary | ICD-10-CM | POA: Diagnosis not present

## 2016-09-01 DIAGNOSIS — I10 Essential (primary) hypertension: Secondary | ICD-10-CM | POA: Diagnosis not present

## 2016-09-01 DIAGNOSIS — M109 Gout, unspecified: Secondary | ICD-10-CM | POA: Diagnosis not present

## 2016-09-08 DIAGNOSIS — Z961 Presence of intraocular lens: Secondary | ICD-10-CM | POA: Diagnosis not present

## 2016-10-07 IMAGING — MR MR THORACIC SPINE WO/W CM
6 of 14 series · 19 of 48 positions shown · IV contrast (multihance)
Comparison: 08/08/2014

CLINICAL DATA: Status post surgery for T10-11 spinal cord tumor.
History of ependymoma resection.

EXAM:
MRI THORACIC SPINE WITHOUT AND WITH CONTRAST
TECHNIQUE: Multiplanar and multiecho pulse sequences of the thoracic spine were
obtained without and with intravenous contrast.
CONTRAST:  20mL MULTIHANCE GADOBENATE DIMEGLUMINE 529 MG/ML IV SOLN

[Series 4: T2 · sagittal · 3.0mm · 0.62mm/px · 2 of 15 slices shown (1 of 3)]
[im 1/15]
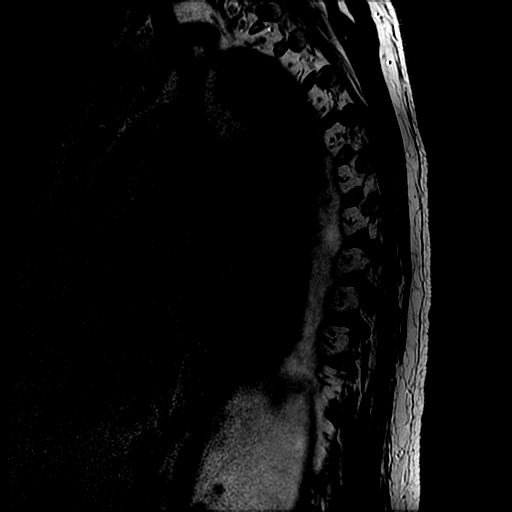
[im 15/15]
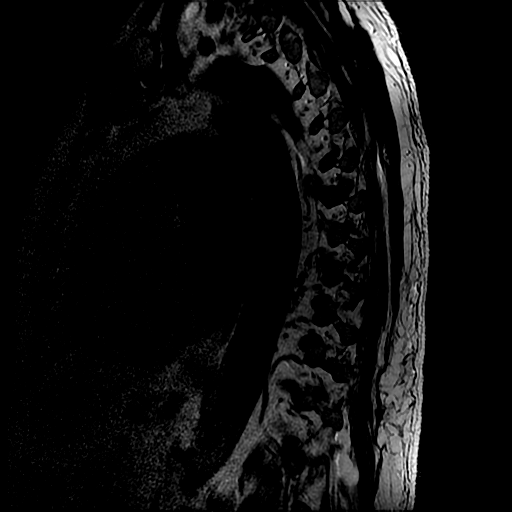

[Series 5: T1 · sagittal · 3.0mm · 0.62mm/px · 2 of 15 slices shown (1 of 3)]
[im 1/15]
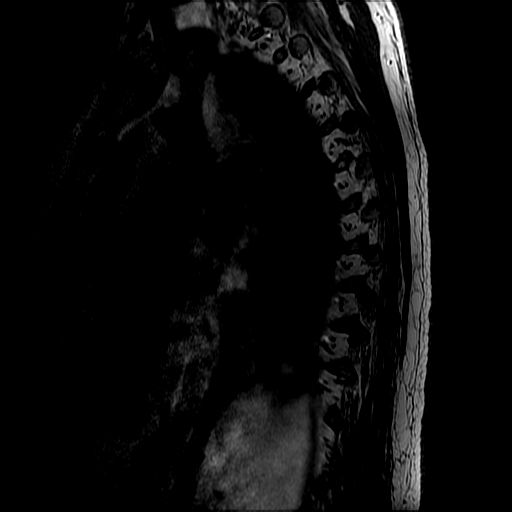
[im 15/15]
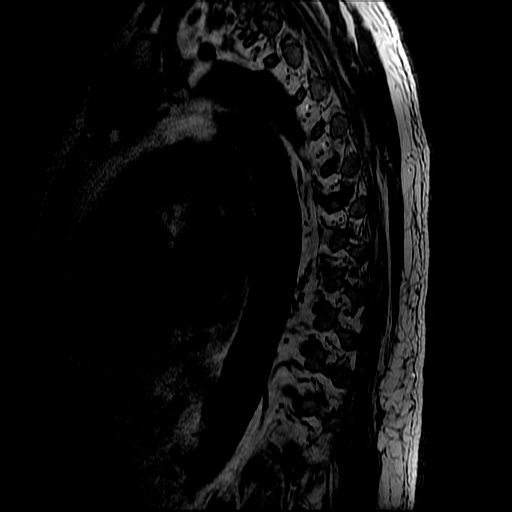

[Series 7: T2 · axial · 4.0mm · 0.43mm/px · z∈[-188,-43]mm · 4 of 31 slices shown (2 of 3)]
[im 1/31]
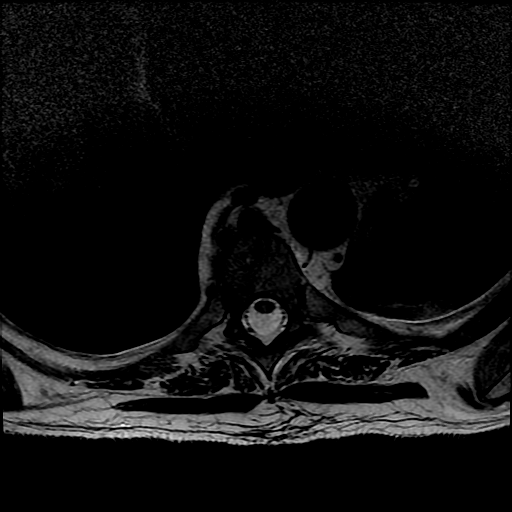
[im 11/31]
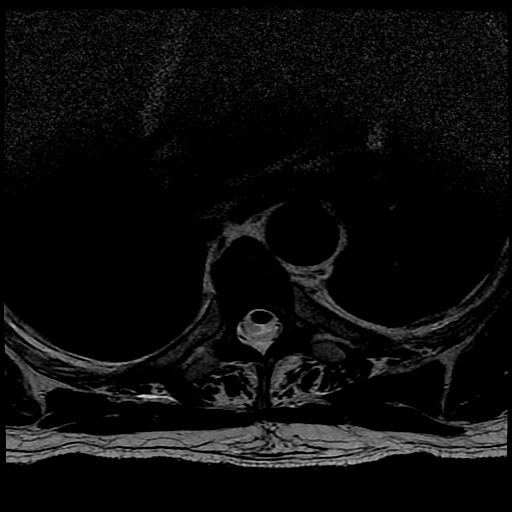
[im 21/31]
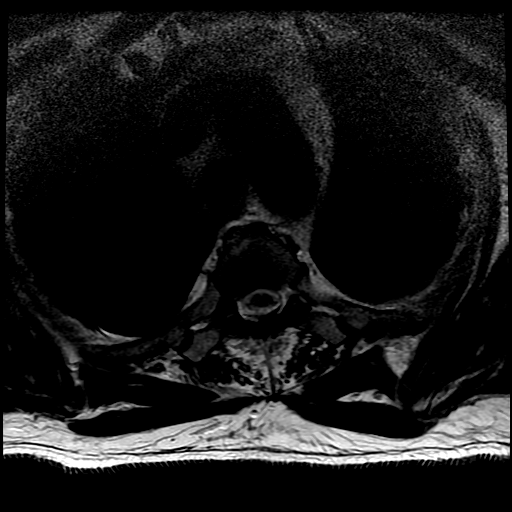
[im 31/31]
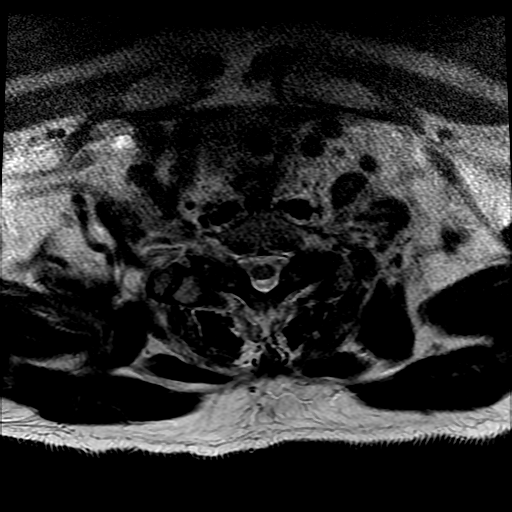

[Series 8: T2 · axial · 4.0mm · 0.43mm/px · z∈[-306,-151]mm · 5 of 32 slices shown (3 of 3)]
[im 1/32]
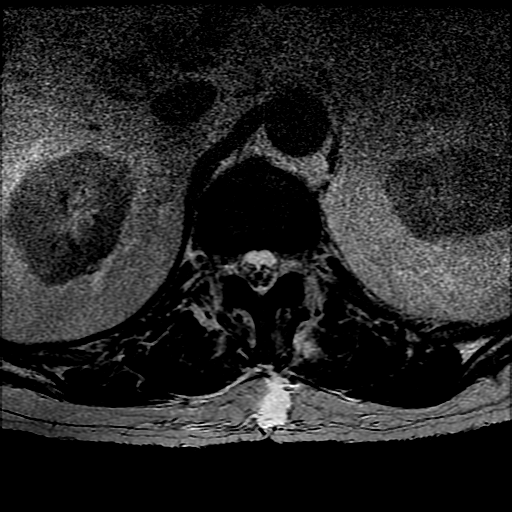
[im 8/32]
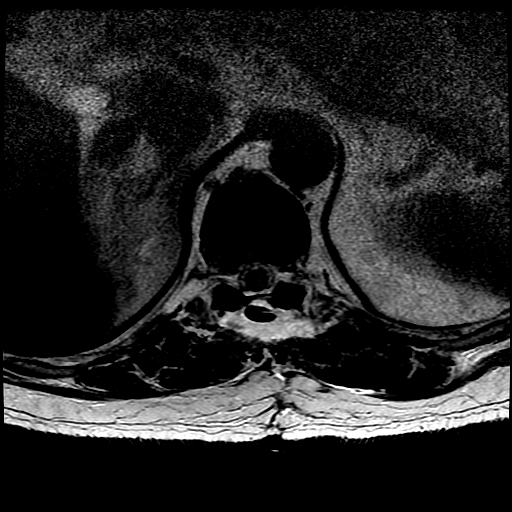
[im 16/32]
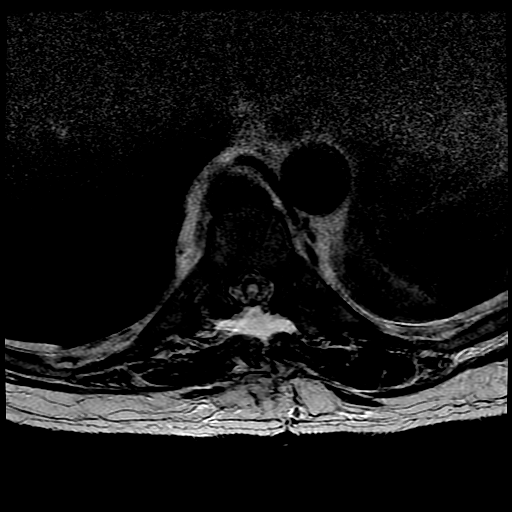
[im 24/32]
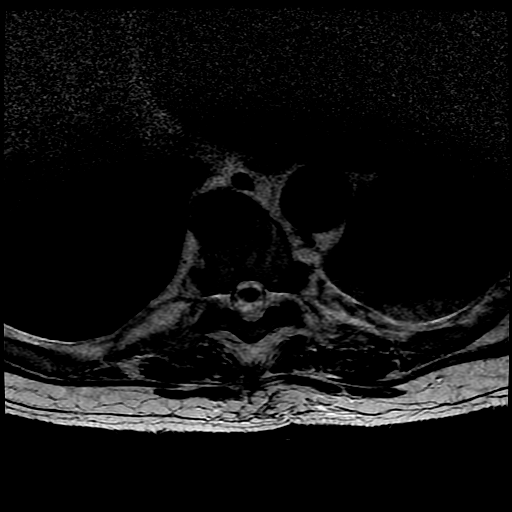
[im 32/32]
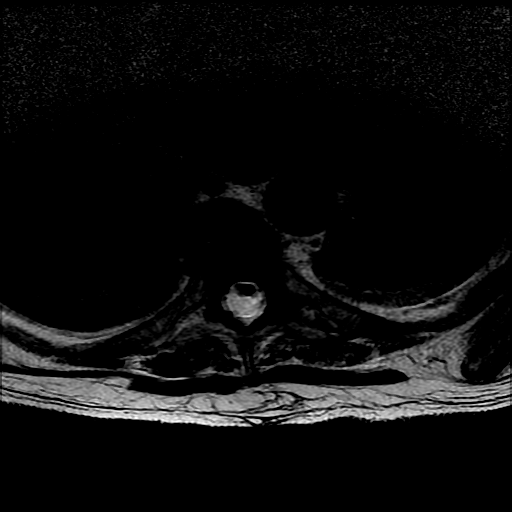

[Series 11: T1 · axial · non-contrast · 4.0mm · 0.43mm/px · z∈[-188,-43]mm · 4 of 31 slices shown (2 of 3)]
[im 1/31]
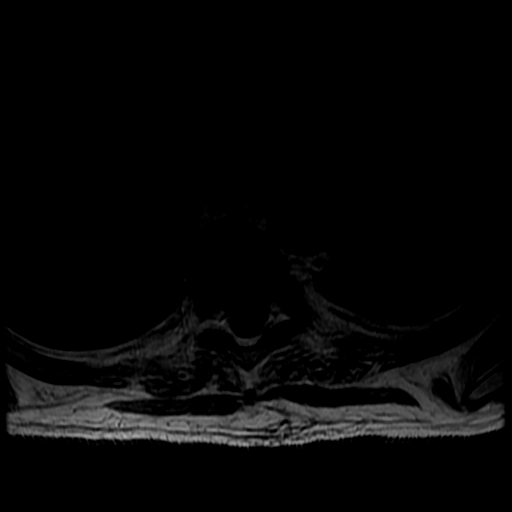
[im 11/31]
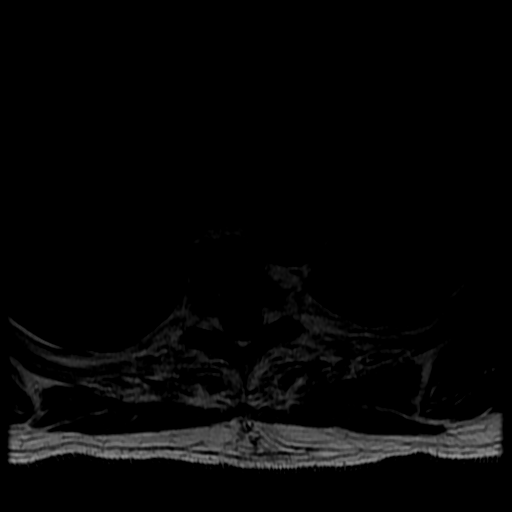
[im 21/31]
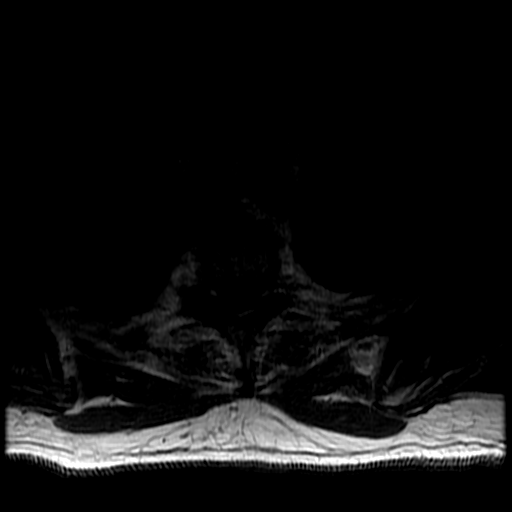
[im 31/31]
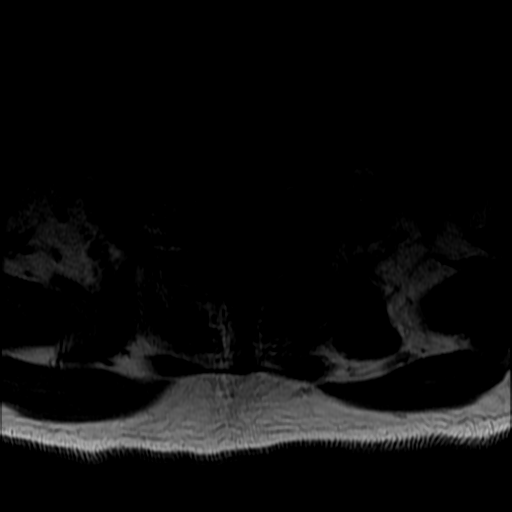

[Series 12: T1 · axial · non-contrast · 4.0mm · 0.43mm/px · z∈[-306,-271]mm · 2 of 32 slices shown (3 of 3)]
[im 1/32]
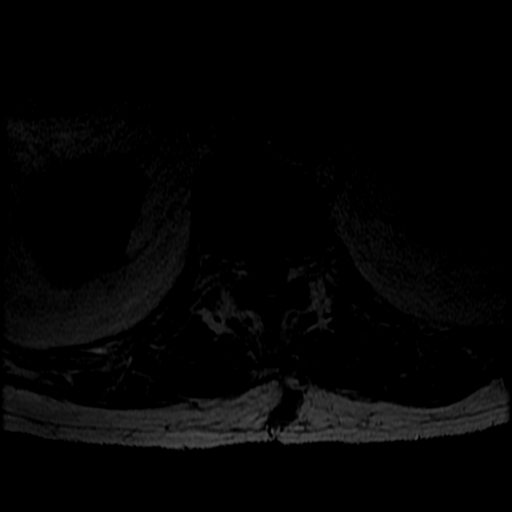
[im 8/32]
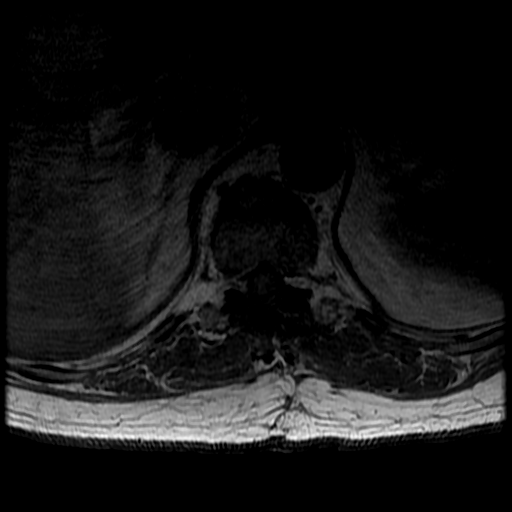

[19 of 48 positions shown; findings below may reference images not displayed]

FINDINGS: Status post redo laminotomy from the lower T9 to upper T12 for
treatment of ependymoma. The large majority of enhancing tumor seen
previously is no longer visible. Additionally, a focal syrinx from
T9-T11 has been partially decompressed, now 5 mm in maximal
diameter, previously 7 mm. Punctate enhancement present along the
superficial dorsal cord bilaterally along the presumed myelopathy
defect, indeterminate between minimal residual tumor and
postoperative enhancement. Susceptibility artifact within the
incision and at the level of the lower syrinx, expected
postoperatively. Incisional and laminectomy defect fluid with mild
mass effect on the dorsal dura, but no significant mass effect on
the cord.

Small layering pleural effusions.

Diffuse spondylosis and facet arthropathy. Notable T5-6 left
foraminal, T10-11 left foraminal, and T2-3 right foraminal stenosis
from facet overgrowth.
IMPRESSION: 1. Recent resection of presumed lower cord ependymoma recurrence.
Minimal enhancement along the dorsal cord, at the presumed myelotomy
defect, which could be postoperative or minimal residual tumor.
Lower cord cyst has been partially decompressed.
2. Expected incisional fluid with mild dural mass effect at the
laminectomy.

## 2016-11-14 DIAGNOSIS — Z85820 Personal history of malignant melanoma of skin: Secondary | ICD-10-CM | POA: Diagnosis not present

## 2016-11-14 DIAGNOSIS — L304 Erythema intertrigo: Secondary | ICD-10-CM | POA: Diagnosis not present

## 2016-11-14 DIAGNOSIS — L821 Other seborrheic keratosis: Secondary | ICD-10-CM | POA: Diagnosis not present

## 2016-11-14 DIAGNOSIS — L570 Actinic keratosis: Secondary | ICD-10-CM | POA: Diagnosis not present

## 2016-12-24 DIAGNOSIS — Z23 Encounter for immunization: Secondary | ICD-10-CM | POA: Diagnosis not present

## 2017-01-03 DIAGNOSIS — Z7984 Long term (current) use of oral hypoglycemic drugs: Secondary | ICD-10-CM | POA: Diagnosis not present

## 2017-01-03 DIAGNOSIS — E1122 Type 2 diabetes mellitus with diabetic chronic kidney disease: Secondary | ICD-10-CM | POA: Diagnosis not present

## 2017-01-03 DIAGNOSIS — F325 Major depressive disorder, single episode, in full remission: Secondary | ICD-10-CM | POA: Diagnosis not present

## 2017-01-03 DIAGNOSIS — I10 Essential (primary) hypertension: Secondary | ICD-10-CM | POA: Diagnosis not present

## 2017-01-03 DIAGNOSIS — N182 Chronic kidney disease, stage 2 (mild): Secondary | ICD-10-CM | POA: Diagnosis not present

## 2017-01-03 DIAGNOSIS — E1165 Type 2 diabetes mellitus with hyperglycemia: Secondary | ICD-10-CM | POA: Diagnosis not present

## 2017-01-06 ENCOUNTER — Encounter: Payer: Self-pay | Admitting: Cardiology

## 2017-01-06 ENCOUNTER — Ambulatory Visit (INDEPENDENT_AMBULATORY_CARE_PROVIDER_SITE_OTHER): Payer: Medicare Other | Admitting: Cardiology

## 2017-01-06 VITALS — BP 138/74 | HR 63 | Ht 69.5 in | Wt 208.0 lb

## 2017-01-06 DIAGNOSIS — I10 Essential (primary) hypertension: Secondary | ICD-10-CM | POA: Diagnosis not present

## 2017-01-06 DIAGNOSIS — I5189 Other ill-defined heart diseases: Secondary | ICD-10-CM

## 2017-01-06 DIAGNOSIS — E669 Obesity, unspecified: Secondary | ICD-10-CM | POA: Diagnosis not present

## 2017-01-06 DIAGNOSIS — Z9861 Coronary angioplasty status: Secondary | ICD-10-CM | POA: Diagnosis not present

## 2017-01-06 DIAGNOSIS — E785 Hyperlipidemia, unspecified: Secondary | ICD-10-CM | POA: Diagnosis not present

## 2017-01-06 DIAGNOSIS — I491 Atrial premature depolarization: Secondary | ICD-10-CM

## 2017-01-06 DIAGNOSIS — I2510 Atherosclerotic heart disease of native coronary artery without angina pectoris: Secondary | ICD-10-CM

## 2017-01-06 NOTE — Progress Notes (Signed)
PCP: Charles Orn, MD  Clinic Note: Chief Complaint  Patient presents with  . Follow-up    CAD-PCI    HPI: [name] is a 81 y.o. male with a PMH below who presents today for annual f/u for CAD. He is a long-term patient of [name] who has h/o CAD-PCI back in 2006.  He is s/p re-do spinal Surgery for a non-malignant tumor  [name] was last seen on 01/06/2016 - was doing well overall cardiac standpoint. No syncope or near syncope recurrence. No chest tightness or pressure with rest or exertion. He is limited as far as exertion because of his back issues, but has been working with a Insurance underwriter.  Recent Hospitalizations: N/A  Studies Personally Reviewed - (if available, images/films reviewed: From Epic Chart or Care Everywhere)  none  Interval History: [name] returns today again doing quite well without any major complaints from a cardiac standpoint. He is happy that he has seemed to regain control of his bowel and bladder, and is walking better. He still can't really exercise. With the amount of activity he does, he denies any resting or exertional chest tightness or pressure, no dyspnea. No PND, orthopnea or edema. No rapid or irregular heartbeats palpitations. He does occasionally get lightheaded when he first stands up, but denies any syncope/near syncope. No TIA or amaurosis fugax.   No claudication. No longer using CPAP  ROS: A comprehensive was performed. Review of Systems  Constitutional: Negative for malaise/fatigue (Still somewhat deconditioned, but doing better. Less tired) and weight loss.  HENT: Negative for congestion and nosebleeds.   Respiratory: Negative for cough and wheezing.   Gastrointestinal: Negative for blood in stool and melena.  Genitourinary: Negative for hematuria.  Musculoskeletal: Positive for back pain and joint pain. Negative for falls and myalgias.  Neurological: Positive for dizziness (Occasional) and tingling (Legs and feet  are numb.). Negative for headaches.       Still has neuropathic pains from the hip down  Endo/Heme/Allergies: Does not bruise/bleed easily.  Psychiatric/Behavioral: Negative.   All other systems reviewed and are negative.   I have reviewed and (if needed) personally updated the patient's problem list, medications, allergies, past medical and surgical history, social and family history.   Past Medical History:  Diagnosis Date  . Arthritis   . CAD S/P percutaneous coronary angioplasty 08/2004   CARDIOLOGIST-  DR [name]; DES to LCx. X2  - Cypher 2.5 mm postdilated to 2.75 mm (20 mm and 13 mm stents);; Myoview 09/2014: LOW RISK. No Infarct or Ischemia.  . Colon polyps 05/10/2005   Hyperplastic polyps  . Complication of anesthesia    "takes long time to wake up"  . Depression   . Diabetes mellitus type 2 with complications (HCC)    neuropathy; CAD borderline- no med  . Diastolic dysfunction without heart failure    Moderate LVH, Gr 1 DD on Echo 2011; no CHF admissions, no tt on diuretisc  . Essential hypertension   . Frequency of urination   . Gout   . H/O: gout STABLE  . Heart murmur    hx - no notable valvular lesion on Echo  . History of melanoma excision 2010   FOREHEAD  . History of prostate cancer 2004--  S/P SEED IMPLANTS    NO RECURRENCE  . Hydrocele, left   . Hyperlipidemia LDL goal <70   . Impaired hearing RIGHT HEARING AID  . OSA on CPAP    bipap  . Prostate  cancer [name])    prostate , melanoma head  . Shingles 07  . Syncope and collapse March 2017   Likely related to post micturition, Neurontin and dehydration with orthostatic hypotension  . Urgency of urination     Past Surgical History:  Procedure Laterality Date  . Cardiac Event Monitor  3-07/2015   Mostly SR - 58-132 bpm. Rare PVCs (occ bigeminy), Frequent PACs - singlets, couplets - short runs of PAT.  . circumsision    . CORONARY ANGIOPLASTY WITH STENT PLACEMENT  09-01-2004     Cypher DES x 2 d-m  LCx 2.5 mm x 20 mm & 2.5 mm x 13 mm (~2.75 mm)  . EXCISION MELANOMA FROM FOREHEAD  2010  . EYE SURGERY Bilateral    cataracts  . HYDROCELE EXCISION  10/14/2011   Procedure: HYDROCELECTOMY ADULT;  Surgeon: Charles Rud, MD;  Location: [name];  Service: Urology;  Laterality: Left;  . LAMINECTOMY N/A 07/13/2012   Procedure: THORACIC LAMINECTOMY FOR RESECTION OF INTRAMEDULLARY SPINAL CHORD TUMOR WITH SPINAL CHORD MONITORING;  Surgeon: Charles Levine, MD;  Location: [facility];  Service: Neurosurgery;  Laterality: N/A;  Thoracic laminectomy for resection of intramedullary spinal cord tumor with spinal cord monitering and Dr. Christella [name] to assist  . LAMINECTOMY N/A 12/09/2014   Procedure: Redo Thoracic laminectomy with intramedullary tumar resection/USN/[place]/Subarachnoid shunt/spinal cord monitoring/Dr. Kathyrn [name] to assist;  Surgeon: Charles Levine, MD;  Location: [facility];  Service: Neurosurgery;  Laterality: N/A;  Redo Thoracic laminectomy with intramedullary tumar resection/USN/[place]/Subarachnoid shunt/spinal cord monitoring/Dr. Kathyrn [name] to assist  . NM MYOVIEW LTD  3/212014; June 2016   Both Lexiscan: a. LOW RISK, mild inferior bowel artifact; No ischemia or Infarction; b. Low risk, normal study. No infarction or ischemia. No artifact.  Marland Kitchen PROSTATE PALLADIUM GOLD SEED IMPLANTS (118)  11-15-2002   PROSTATE CANCER  . SKIN BIOPSY    . TONSILLECTOMY    . TRANSTHORACIC ECHOCARDIOGRAM  MAR 07,2011   NORMAL EF > 55% / NORMAL FUNCTION WITH IMPAIRED RELAXATION AND MODERATE CONCENTRIC LVH LIKELY HYPERTENSIVE DISEASE    Current Meds  Medication Sig  . allopurinol (ZYLOPRIM) 300 MG tablet Take 1 tablet (300 mg total) by mouth daily.  Marland Kitchen amLODipine (NORVASC) 10 MG tablet Take 10 mg by mouth daily.  . clopidogrel (PLAVIX) 75 MG tablet Take 1 tablet (75 mg total) by mouth daily.  Marland Kitchen escitalopram (LEXAPRO) 20 MG tablet Take 20 mg by mouth daily.  . metFORMIN (GLUCOPHAGE) 500 MG tablet  TAKE 1 TABLET BY MOUTH TWICE A DAY WITH A MEAL  . oxybutynin (DITROPAN XL) 15 MG 24 hr tablet Take 1 tablet (15 mg total) by mouth at bedtime.  . rosuvastatin (CRESTOR) 20 MG tablet Take 1 tablet (20 mg total) by mouth daily.  . tamsulosin (FLOMAX) 0.4 MG CAPS capsule Take 1 capsule (0.4 mg total) by mouth at bedtime.  . valsartan (DIOVAN) 80 MG tablet Take 1 tablet (80 mg total) by mouth every evening.  Marland Kitchen VITAMIN D, CHOLECALCIFEROL, PO Take 1 tablet by mouth daily.     Allergies  Allergen Reactions  . Betadine [Povidone Iodine] Anaphylaxis  . Contrast Media [Iodinated Diagnostic Agents] Anaphylaxis and Rash    Other Reaction: Other reaction  . Iodine Anaphylaxis    Shellfish, IVP dye. Swelling in throat and foaming at mouth.  Marland Kitchen Shellfish Allergy Anaphylaxis    Social History   Social History  . Marital status: Married    Spouse name: N/A  . Number of children:  N/A  . Years of education: N/A   Social History Main Topics  . Smoking status: Never Smoker  . Smokeless tobacco: Never Used     Comment: occ alcohol  . Alcohol use Yes     Comment: OCCASIONAL  . Drug use: No  . Sexual activity: Not Asked   Other Topics Concern  . None   Social History Narrative   He is married, father of 2, grandfather of 49. Does not really get exercise now because   of his back issues. Does not smoke and drinks social alcohol. Works in [employer].    family history includes Hypertension in his brother, father, and mother; Prostate cancer in his brother and father; Stroke in his mother.  Wt Readings from Last 3 Encounters:  01/06/17 208 lb (94.3 kg)  01/06/16 208 lb (94.3 kg)  08/11/15 210 lb 12.8 oz (95.6 kg)    PHYSICAL EXAM BP 138/74   Pulse 63   Ht 5' 9.5" (1.765 m)   Wt 208 lb (94.3 kg)   BMI 30.28 kg/m  Physical Exam  Constitutional: He is oriented to person, place, and time. He appears well-developed and well-nourished. No distress.  Healthy-appearing. Pleasant mood and  affect. Well groomed  HENT:  Head: Normocephalic and atraumatic.  Neck: No hepatojugular reflux and no JVD present. Carotid bruit is not present.  Cardiovascular: Normal rate, regular rhythm, normal heart sounds, intact distal pulses and normal pulses.   Occasional extrasystoles are present. PMI is not displaced.  Exam reveals no gallop and no friction rub.   No murmur heard. Pulmonary/Chest: Effort normal and breath sounds normal. No respiratory distress. He has no rales.  Abdominal: Soft. Bowel sounds are normal. He exhibits no distension. There is no tenderness. There is no rebound.  Musculoskeletal: Normal range of motion. He exhibits edema.  He does walk with a cane. Somewhat slow unsteady gait.  Neurological: He is alert and oriented to person, place, and time.  Skin: Skin is warm and dry. No erythema.  Psychiatric: He has a normal mood and affect. His behavior is normal. Judgment and thought content normal.  Very jovial. Click with a joke.  Nursing note and vitals reviewed.   Adult ECG Report  Rate: 63 ;  Rhythm: normal sinus rhythm and PAC.  Otherwise normal axis, intervals and durations;   Narrative Interpretation: Stable EKG   Other studies Reviewed: Additional studies/ records that were reviewed today include:  Recent Labs:  Labs followed by PCP. Not available.    ASSESSMENT / PLAN: Problem List Items Addressed This Visit    CAD S/P percutaneous coronary angioplasty: Cypher DES x2 to circumflex - Primary (Chronic)    Distant history of PCI in 2006 with Cypher DES stents. Negative Myoview in June 2016 with no recurrent anginal or heart failure symptoms.  Continue Plavix without aspirin.  On amlodipine and valsartan  Not on beta blocker because of fatigue, and bradycardia.  On stable dose of statin. Labs followed by PCP.      Relevant Orders   EKG 29-JMEQ   Diastolic dysfunction without heart failure (Chronic)    Moderate LVH on echo. No significant heart failure  symptoms. Some dyspnea is probably related to deconditioning as opposed diastolic dysfunction. Euvolemic with no diuretic requirement.      Relevant Orders   EKG 12-Lead   Essential hypertension (Chronic)    Stable today on ARB and amlodipine.      Finding of multiple premature atrial contractions by electrocardiography (Chronic)  Ectopy noted on exam, and PACs/PVCs noted on EKGs. He does not notice them and there are no signs of any arrhythmias. Based on resting bradycardia and history of fatigue, I would hold off on using a beta blocker unless he is asymptomatic. He has had fatigue issues with beta blockers in the past.      Hyperlipidemia LDL goal <70 (Chronic)    Back on Crestor now. Has monitored by PCP. Unfortunately I do not have the results.  Goal LDL is less than 70. He indicates to me that he has been doing well with his labs.      Obesity (BMI 30-39.9) (Chronic)    He had been doing well with weight loss, but got off track. Hopefully if he is able to get pregnant some exercise he will be able to do better.         Current medicines are reviewed at length with the patient today. (+/- concerns) none The following changes have been made: none   Patient Instructions  NO CHANGE WITH CURRENT MEDICATIONS    Your physician wants you to follow-up in [place].You will receive a reminder letter in the mail two months in advance. If you don't receive a letter, please call our office to schedule the follow-up appointment.    Studies Ordered:   Orders Placed This Encounter  Procedures  . EKG 12-Lead      Glenetta Hew, M.D., M.S. Interventional Cardiologist   Pager # [phone] Phone # [phone] [address] [place], [place] [address]

## 2017-01-06 NOTE — Patient Instructions (Signed)
NO CHANGE WITH CURRENT MEDICATIONS    Your physician wants you to follow-up in [place].You will receive a reminder letter in the mail two months in advance. If you don't receive a letter, please call our office to schedule the follow-up appointment.

## 2017-01-08 ENCOUNTER — Encounter: Payer: Self-pay | Admitting: Cardiology

## 2017-01-08 NOTE — Assessment & Plan Note (Signed)
Distant history of PCI in 2006 with Cypher DES stents. Negative Myoview in June 2016 with no recurrent anginal or heart failure symptoms.  Continue Plavix without aspirin.  On amlodipine and valsartan  Not on beta blocker because of fatigue, and bradycardia.  On stable dose of statin. Labs followed by PCP.

## 2017-01-08 NOTE — Assessment & Plan Note (Addendum)
He had been doing well with weight loss, but got off track. Hopefully if he is able to get pregnant some exercise he will be able to do better.

## 2017-01-08 NOTE — Assessment & Plan Note (Signed)
Stable today on ARB and amlodipine.

## 2017-01-08 NOTE — Assessment & Plan Note (Signed)
Back on Crestor now. Has monitored by PCP. Unfortunately I do not have the results.  Goal LDL is less than 70. He indicates to me that he has been doing well with his labs.

## 2017-01-08 NOTE — Assessment & Plan Note (Signed)
Ectopy noted on exam, and PACs/PVCs noted on EKGs. He does not notice them and there are no signs of any arrhythmias. Based on resting bradycardia and history of fatigue, I would hold off on using a beta blocker unless he is asymptomatic. He has had fatigue issues with beta blockers in the past.

## 2017-01-08 NOTE — Assessment & Plan Note (Signed)
Moderate LVH on echo. No significant heart failure symptoms. Some dyspnea is probably related to deconditioning as opposed diastolic dysfunction. Euvolemic with no diuretic requirement.

## 2017-02-21 DIAGNOSIS — N182 Chronic kidney disease, stage 2 (mild): Secondary | ICD-10-CM | POA: Diagnosis not present

## 2017-02-21 DIAGNOSIS — E1122 Type 2 diabetes mellitus with diabetic chronic kidney disease: Secondary | ICD-10-CM | POA: Diagnosis not present

## 2017-02-21 DIAGNOSIS — Z7984 Long term (current) use of oral hypoglycemic drugs: Secondary | ICD-10-CM | POA: Diagnosis not present

## 2017-02-21 DIAGNOSIS — I701 Atherosclerosis of renal artery: Secondary | ICD-10-CM | POA: Diagnosis not present

## 2017-02-21 DIAGNOSIS — I2510 Atherosclerotic heart disease of native coronary artery without angina pectoris: Secondary | ICD-10-CM | POA: Diagnosis not present

## 2017-02-21 DIAGNOSIS — F325 Major depressive disorder, single episode, in full remission: Secondary | ICD-10-CM | POA: Diagnosis not present

## 2017-02-21 DIAGNOSIS — F329 Major depressive disorder, single episode, unspecified: Secondary | ICD-10-CM | POA: Diagnosis not present

## 2017-02-21 DIAGNOSIS — I10 Essential (primary) hypertension: Secondary | ICD-10-CM | POA: Diagnosis not present

## 2017-02-21 DIAGNOSIS — Z8546 Personal history of malignant neoplasm of prostate: Secondary | ICD-10-CM | POA: Diagnosis not present

## 2017-02-21 DIAGNOSIS — E782 Mixed hyperlipidemia: Secondary | ICD-10-CM | POA: Diagnosis not present

## 2017-02-21 DIAGNOSIS — E119 Type 2 diabetes mellitus without complications: Secondary | ICD-10-CM | POA: Diagnosis not present

## 2017-03-26 DIAGNOSIS — J069 Acute upper respiratory infection, unspecified: Secondary | ICD-10-CM | POA: Diagnosis not present

## 2017-04-27 DIAGNOSIS — E782 Mixed hyperlipidemia: Secondary | ICD-10-CM | POA: Diagnosis not present

## 2017-04-27 DIAGNOSIS — C720 Malignant neoplasm of spinal cord: Secondary | ICD-10-CM | POA: Diagnosis not present

## 2017-04-27 DIAGNOSIS — Z8546 Personal history of malignant neoplasm of prostate: Secondary | ICD-10-CM | POA: Diagnosis not present

## 2017-04-27 DIAGNOSIS — Z0000 Encounter for general adult medical examination without abnormal findings: Secondary | ICD-10-CM | POA: Diagnosis not present

## 2017-04-27 DIAGNOSIS — R8290 Unspecified abnormal findings in urine: Secondary | ICD-10-CM | POA: Diagnosis not present

## 2017-04-27 DIAGNOSIS — Z85820 Personal history of malignant melanoma of skin: Secondary | ICD-10-CM | POA: Diagnosis not present

## 2017-04-27 DIAGNOSIS — I10 Essential (primary) hypertension: Secondary | ICD-10-CM | POA: Diagnosis not present

## 2017-04-27 DIAGNOSIS — F325 Major depressive disorder, single episode, in full remission: Secondary | ICD-10-CM | POA: Diagnosis not present

## 2017-04-27 DIAGNOSIS — E1122 Type 2 diabetes mellitus with diabetic chronic kidney disease: Secondary | ICD-10-CM | POA: Diagnosis not present

## 2017-04-27 DIAGNOSIS — N3281 Overactive bladder: Secondary | ICD-10-CM | POA: Diagnosis not present

## 2017-04-27 DIAGNOSIS — Z1389 Encounter for screening for other disorder: Secondary | ICD-10-CM | POA: Diagnosis not present

## 2017-04-27 DIAGNOSIS — M109 Gout, unspecified: Secondary | ICD-10-CM | POA: Diagnosis not present

## 2017-04-27 DIAGNOSIS — G4733 Obstructive sleep apnea (adult) (pediatric): Secondary | ICD-10-CM | POA: Diagnosis not present

## 2017-04-27 DIAGNOSIS — F329 Major depressive disorder, single episode, unspecified: Secondary | ICD-10-CM | POA: Diagnosis not present

## 2017-04-27 DIAGNOSIS — N182 Chronic kidney disease, stage 2 (mild): Secondary | ICD-10-CM | POA: Diagnosis not present

## 2017-05-17 DIAGNOSIS — L111 Transient acantholytic dermatosis [Grover]: Secondary | ICD-10-CM | POA: Diagnosis not present

## 2017-05-17 DIAGNOSIS — L814 Other melanin hyperpigmentation: Secondary | ICD-10-CM | POA: Diagnosis not present

## 2017-05-17 DIAGNOSIS — D2272 Melanocytic nevi of left lower limb, including hip: Secondary | ICD-10-CM | POA: Diagnosis not present

## 2017-05-17 DIAGNOSIS — L720 Epidermal cyst: Secondary | ICD-10-CM | POA: Diagnosis not present

## 2017-05-17 DIAGNOSIS — L570 Actinic keratosis: Secondary | ICD-10-CM | POA: Diagnosis not present

## 2017-05-22 DIAGNOSIS — I10 Essential (primary) hypertension: Secondary | ICD-10-CM | POA: Diagnosis not present

## 2017-05-22 DIAGNOSIS — M4714 Other spondylosis with myelopathy, thoracic region: Secondary | ICD-10-CM | POA: Diagnosis not present

## 2017-05-22 DIAGNOSIS — Z6831 Body mass index (BMI) 31.0-31.9, adult: Secondary | ICD-10-CM | POA: Diagnosis not present

## 2017-05-22 DIAGNOSIS — D497 Neoplasm of unspecified behavior of endocrine glands and other parts of nervous system: Secondary | ICD-10-CM | POA: Diagnosis not present

## 2017-05-29 DIAGNOSIS — G629 Polyneuropathy, unspecified: Secondary | ICD-10-CM | POA: Diagnosis not present

## 2017-05-29 DIAGNOSIS — C61 Malignant neoplasm of prostate: Secondary | ICD-10-CM | POA: Diagnosis not present

## 2017-05-29 DIAGNOSIS — R8271 Bacteriuria: Secondary | ICD-10-CM | POA: Diagnosis not present

## 2017-05-29 DIAGNOSIS — R3915 Urgency of urination: Secondary | ICD-10-CM | POA: Diagnosis not present

## 2017-05-29 DIAGNOSIS — R350 Frequency of micturition: Secondary | ICD-10-CM | POA: Diagnosis not present

## 2017-05-30 ENCOUNTER — Other Ambulatory Visit: Payer: Self-pay | Admitting: Neurosurgery

## 2017-05-30 DIAGNOSIS — D497 Neoplasm of unspecified behavior of endocrine glands and other parts of nervous system: Secondary | ICD-10-CM

## 2017-06-13 ENCOUNTER — Other Ambulatory Visit: Payer: Medicare Other

## 2017-06-13 ENCOUNTER — Ambulatory Visit
Admission: RE | Admit: 2017-06-13 | Discharge: 2017-06-13 | Disposition: A | Discharge status: Home / Self Care | Payer: Medicare Other | Source: Ambulatory Visit | Attending: Neurosurgery | Admitting: Neurosurgery

## 2017-06-13 DIAGNOSIS — M5124 Other intervertebral disc displacement, thoracic region: Secondary | ICD-10-CM | POA: Diagnosis not present

## 2017-06-13 DIAGNOSIS — D497 Neoplasm of unspecified behavior of endocrine glands and other parts of nervous system: Secondary | ICD-10-CM

## 2017-06-13 DIAGNOSIS — M4804 Spinal stenosis, thoracic region: Secondary | ICD-10-CM | POA: Diagnosis not present

## 2017-06-13 DIAGNOSIS — M5126 Other intervertebral disc displacement, lumbar region: Secondary | ICD-10-CM | POA: Diagnosis not present

## 2017-06-13 DIAGNOSIS — M48061 Spinal stenosis, lumbar region without neurogenic claudication: Secondary | ICD-10-CM | POA: Diagnosis not present

## 2017-06-13 MED ORDER — GADOBENATE DIMEGLUMINE 529 MG/ML IV SOLN
10.0000 mL | Freq: Once | INTRAVENOUS | Status: AC | PRN
  Administered 2017-06-13: 10 mL via INTRAVENOUS

## 2017-06-14 DIAGNOSIS — E1122 Type 2 diabetes mellitus with diabetic chronic kidney disease: Secondary | ICD-10-CM | POA: Diagnosis not present

## 2017-06-14 DIAGNOSIS — Z8546 Personal history of malignant neoplasm of prostate: Secondary | ICD-10-CM | POA: Diagnosis not present

## 2017-06-14 DIAGNOSIS — E119 Type 2 diabetes mellitus without complications: Secondary | ICD-10-CM | POA: Diagnosis not present

## 2017-06-14 DIAGNOSIS — I2510 Atherosclerotic heart disease of native coronary artery without angina pectoris: Secondary | ICD-10-CM | POA: Diagnosis not present

## 2017-06-14 DIAGNOSIS — I701 Atherosclerosis of renal artery: Secondary | ICD-10-CM | POA: Diagnosis not present

## 2017-06-14 DIAGNOSIS — E782 Mixed hyperlipidemia: Secondary | ICD-10-CM | POA: Diagnosis not present

## 2017-06-14 DIAGNOSIS — I10 Essential (primary) hypertension: Secondary | ICD-10-CM | POA: Diagnosis not present

## 2017-06-14 DIAGNOSIS — N182 Chronic kidney disease, stage 2 (mild): Secondary | ICD-10-CM | POA: Diagnosis not present

## 2017-06-14 DIAGNOSIS — F329 Major depressive disorder, single episode, unspecified: Secondary | ICD-10-CM | POA: Diagnosis not present

## 2017-06-14 DIAGNOSIS — Z7984 Long term (current) use of oral hypoglycemic drugs: Secondary | ICD-10-CM | POA: Diagnosis not present

## 2017-06-23 DIAGNOSIS — M4714 Other spondylosis with myelopathy, thoracic region: Secondary | ICD-10-CM | POA: Diagnosis not present

## 2017-06-23 DIAGNOSIS — D497 Neoplasm of unspecified behavior of endocrine glands and other parts of nervous system: Secondary | ICD-10-CM | POA: Diagnosis not present

## 2017-08-24 DIAGNOSIS — E119 Type 2 diabetes mellitus without complications: Secondary | ICD-10-CM | POA: Diagnosis not present

## 2017-08-24 DIAGNOSIS — I10 Essential (primary) hypertension: Secondary | ICD-10-CM | POA: Diagnosis not present

## 2017-08-24 DIAGNOSIS — C720 Malignant neoplasm of spinal cord: Secondary | ICD-10-CM | POA: Diagnosis not present

## 2017-08-24 DIAGNOSIS — M109 Gout, unspecified: Secondary | ICD-10-CM | POA: Diagnosis not present

## 2017-09-14 DIAGNOSIS — H524 Presbyopia: Secondary | ICD-10-CM | POA: Diagnosis not present

## 2017-09-14 DIAGNOSIS — Z961 Presence of intraocular lens: Secondary | ICD-10-CM | POA: Diagnosis not present

## 2017-09-14 DIAGNOSIS — Z7984 Long term (current) use of oral hypoglycemic drugs: Secondary | ICD-10-CM | POA: Diagnosis not present

## 2017-09-14 DIAGNOSIS — E119 Type 2 diabetes mellitus without complications: Secondary | ICD-10-CM | POA: Diagnosis not present

## 2017-09-14 DIAGNOSIS — H52203 Unspecified astigmatism, bilateral: Secondary | ICD-10-CM | POA: Diagnosis not present

## 2017-12-03 HISTORY — PX: TRANSTHORACIC ECHOCARDIOGRAM: SHX275

## 2017-12-11 ENCOUNTER — Inpatient Hospital Stay (HOSPITAL_COMMUNITY)
Admission: EM | Admit: 2017-12-11 | Discharge: 2017-12-12 | DRG: 310 | Disposition: A | Discharge status: Home / Self Care | Payer: Medicare Other | Attending: Cardiology | Admitting: Cardiology

## 2017-12-11 ENCOUNTER — Other Ambulatory Visit: Payer: Self-pay

## 2017-12-11 ENCOUNTER — Emergency Department (HOSPITAL_COMMUNITY): Payer: Medicare Other

## 2017-12-11 DIAGNOSIS — Z955 Presence of coronary angioplasty implant and graft: Secondary | ICD-10-CM

## 2017-12-11 DIAGNOSIS — I10 Essential (primary) hypertension: Secondary | ICD-10-CM | POA: Diagnosis not present

## 2017-12-11 DIAGNOSIS — R42 Dizziness and giddiness: Secondary | ICD-10-CM | POA: Diagnosis not present

## 2017-12-11 DIAGNOSIS — I25118 Atherosclerotic heart disease of native coronary artery with other forms of angina pectoris: Secondary | ICD-10-CM

## 2017-12-11 DIAGNOSIS — Z8249 Family history of ischemic heart disease and other diseases of the circulatory system: Secondary | ICD-10-CM

## 2017-12-11 DIAGNOSIS — Z888 Allergy status to other drugs, medicaments and biological substances status: Secondary | ICD-10-CM

## 2017-12-11 DIAGNOSIS — I4891 Unspecified atrial fibrillation: Principal | ICD-10-CM | POA: Diagnosis present

## 2017-12-11 DIAGNOSIS — R011 Cardiac murmur, unspecified: Secondary | ICD-10-CM | POA: Diagnosis present

## 2017-12-11 DIAGNOSIS — Z85820 Personal history of malignant melanoma of skin: Secondary | ICD-10-CM

## 2017-12-11 DIAGNOSIS — Z7982 Long term (current) use of aspirin: Secondary | ICD-10-CM

## 2017-12-11 DIAGNOSIS — G4733 Obstructive sleep apnea (adult) (pediatric): Secondary | ICD-10-CM | POA: Diagnosis present

## 2017-12-11 DIAGNOSIS — Z7901 Long term (current) use of anticoagulants: Secondary | ICD-10-CM

## 2017-12-11 DIAGNOSIS — Z7984 Long term (current) use of oral hypoglycemic drugs: Secondary | ICD-10-CM

## 2017-12-11 DIAGNOSIS — M1990 Unspecified osteoarthritis, unspecified site: Secondary | ICD-10-CM | POA: Diagnosis present

## 2017-12-11 DIAGNOSIS — Z8042 Family history of malignant neoplasm of prostate: Secondary | ICD-10-CM

## 2017-12-11 DIAGNOSIS — F329 Major depressive disorder, single episode, unspecified: Secondary | ICD-10-CM | POA: Diagnosis present

## 2017-12-11 DIAGNOSIS — E785 Hyperlipidemia, unspecified: Secondary | ICD-10-CM | POA: Diagnosis present

## 2017-12-11 DIAGNOSIS — Z885 Allergy status to narcotic agent status: Secondary | ICD-10-CM | POA: Diagnosis not present

## 2017-12-11 DIAGNOSIS — E1140 Type 2 diabetes mellitus with diabetic neuropathy, unspecified: Secondary | ICD-10-CM | POA: Diagnosis present

## 2017-12-11 DIAGNOSIS — Z79899 Other long term (current) drug therapy: Secondary | ICD-10-CM | POA: Diagnosis not present

## 2017-12-11 DIAGNOSIS — M109 Gout, unspecified: Secondary | ICD-10-CM | POA: Diagnosis present

## 2017-12-11 DIAGNOSIS — E782 Mixed hyperlipidemia: Secondary | ICD-10-CM

## 2017-12-11 DIAGNOSIS — Z91041 Radiographic dye allergy status: Secondary | ICD-10-CM

## 2017-12-11 DIAGNOSIS — I480 Paroxysmal atrial fibrillation: Secondary | ICD-10-CM

## 2017-12-11 DIAGNOSIS — Z9842 Cataract extraction status, left eye: Secondary | ICD-10-CM

## 2017-12-11 DIAGNOSIS — Z9841 Cataract extraction status, right eye: Secondary | ICD-10-CM | POA: Diagnosis not present

## 2017-12-11 DIAGNOSIS — Z7902 Long term (current) use of antithrombotics/antiplatelets: Secondary | ICD-10-CM | POA: Diagnosis not present

## 2017-12-11 DIAGNOSIS — R61 Generalized hyperhidrosis: Secondary | ICD-10-CM | POA: Diagnosis not present

## 2017-12-11 DIAGNOSIS — Z8546 Personal history of malignant neoplasm of prostate: Secondary | ICD-10-CM

## 2017-12-11 DIAGNOSIS — Z6832 Body mass index (BMI) 32.0-32.9, adult: Secondary | ICD-10-CM

## 2017-12-11 DIAGNOSIS — Z823 Family history of stroke: Secondary | ICD-10-CM

## 2017-12-11 DIAGNOSIS — I2510 Atherosclerotic heart disease of native coronary artery without angina pectoris: Secondary | ICD-10-CM | POA: Diagnosis present

## 2017-12-11 DIAGNOSIS — H9190 Unspecified hearing loss, unspecified ear: Secondary | ICD-10-CM | POA: Diagnosis present

## 2017-12-11 DIAGNOSIS — Z86010 Personal history of colonic polyps: Secondary | ICD-10-CM | POA: Diagnosis not present

## 2017-12-11 DIAGNOSIS — R55 Syncope and collapse: Secondary | ICD-10-CM | POA: Diagnosis not present

## 2017-12-11 DIAGNOSIS — Z9861 Coronary angioplasty status: Secondary | ICD-10-CM

## 2017-12-11 DIAGNOSIS — E669 Obesity, unspecified: Secondary | ICD-10-CM | POA: Diagnosis present

## 2017-12-11 HISTORY — DX: Long term (current) use of anticoagulants: Z79.01

## 2017-12-11 HISTORY — DX: Paroxysmal atrial fibrillation: I48.0

## 2017-12-11 LAB — CBC WITH DIFFERENTIAL/PLATELET
Abs Immature Granulocytes: 0.1 10*3/uL (ref 0.0–0.1)
Basophils Absolute: 0 10*3/uL (ref 0.0–0.1)
Basophils Relative: 0 %
Eosinophils Absolute: 0.1 10*3/uL (ref 0.0–0.7)
Eosinophils Relative: 1 %
HCT: 43.2 % (ref 39.0–52.0)
Hemoglobin: 14.1 g/dL (ref 13.0–17.0)
Immature Granulocytes: 1 %
Lymphocytes Relative: 9 %
Lymphs Abs: 1 10*3/uL (ref 0.7–4.0)
MCH: 29.4 pg (ref 26.0–34.0)
MCHC: 32.6 g/dL (ref 30.0–36.0)
MCV: 90.2 fL (ref 78.0–100.0)
Monocytes Absolute: 0.8 10*3/uL (ref 0.1–1.0)
Monocytes Relative: 7 %
Neutro Abs: 8.8 10*3/uL — ABNORMAL HIGH (ref 1.7–7.7)
Neutrophils Relative %: 82 %
Platelets: 189 10*3/uL (ref 150–400)
RBC: 4.79 MIL/uL (ref 4.22–5.81)
RDW: 13.8 % (ref 11.5–15.5)
WBC: 10.8 10*3/uL — ABNORMAL HIGH (ref 4.0–10.5)

## 2017-12-11 LAB — COMPREHENSIVE METABOLIC PANEL
ALT: 17 U/L (ref 0–44)
AST: 22 U/L (ref 15–41)
Albumin: 3.5 g/dL (ref 3.5–5.0)
Alkaline Phosphatase: 46 U/L (ref 38–126)
Anion gap: 9 (ref 5–15)
BUN: 20 mg/dL (ref 8–23)
CO2: 26 mmol/L (ref 22–32)
Calcium: 8.5 mg/dL — ABNORMAL LOW (ref 8.9–10.3)
Chloride: 104 mmol/L (ref 98–111)
Creatinine, Ser: 1.39 mg/dL — ABNORMAL HIGH (ref 0.61–1.24)
GFR calc Af Amer: 54 mL/min — ABNORMAL LOW (ref >60)
GFR calc non Af Amer: 47 mL/min — ABNORMAL LOW (ref >60)
Glucose, Bld: 138 mg/dL — ABNORMAL HIGH (ref 70–99)
Potassium: 4.2 mmol/L (ref 3.5–5.1)
Sodium: 139 mmol/L (ref 135–145)
Total Bilirubin: 0.5 mg/dL (ref 0.3–1.2)
Total Protein: 5.9 g/dL — ABNORMAL LOW (ref 6.5–8.1)

## 2017-12-11 LAB — BASIC METABOLIC PANEL
Anion gap: 10 (ref 5–15)
BUN: 20 mg/dL (ref 8–23)
CO2: 26 mmol/L (ref 22–32)
Calcium: 8.7 mg/dL — ABNORMAL LOW (ref 8.9–10.3)
Chloride: 105 mmol/L (ref 98–111)
Creatinine, Ser: 1.27 mg/dL — ABNORMAL HIGH (ref 0.61–1.24)
GFR calc Af Amer: >60 mL/min (ref >60)
GFR calc non Af Amer: 52 mL/min — ABNORMAL LOW (ref >60)
Glucose, Bld: 129 mg/dL — ABNORMAL HIGH (ref 70–99)
Potassium: 4.1 mmol/L (ref 3.5–5.1)
Sodium: 141 mmol/L (ref 135–145)

## 2017-12-11 LAB — URINALYSIS, ROUTINE W REFLEX MICROSCOPIC
Bacteria, UA: NONE SEEN
Bilirubin Urine: NEGATIVE
Glucose, UA: NEGATIVE mg/dL
Hgb urine dipstick: NEGATIVE
Ketones, ur: NEGATIVE mg/dL
Leukocytes, UA: NEGATIVE
Nitrite: NEGATIVE
Protein, ur: 100 mg/dL — AB
Specific Gravity, Urine: 1.021 (ref 1.005–1.030)
pH: 5 (ref 5.0–8.0)

## 2017-12-11 LAB — BRAIN NATRIURETIC PEPTIDE: B Natriuretic Peptide: 414.6 pg/mL — ABNORMAL HIGH (ref 0.0–100.0)

## 2017-12-11 LAB — TROPONIN I: Troponin I: <0.03 ng/mL (ref <0.03)

## 2017-12-11 LAB — MAGNESIUM: Magnesium: 2 mg/dL (ref 1.7–2.4)

## 2017-12-11 LAB — I-STAT TROPONIN, ED
Troponin i, poc: 0.04 ng/mL (ref 0.00–0.08)
Troponin i, poc: 0.03 ng/mL (ref 0.00–0.08)

## 2017-12-11 LAB — TSH
TSH: 1.807 u[IU]/mL (ref 0.350–4.500)
TSH: 1.834 u[IU]/mL (ref 0.350–4.500)

## 2017-12-11 LAB — PROTIME-INR
INR: 1.05
Prothrombin Time: 13.6 seconds (ref 11.4–15.2)

## 2017-12-11 MED ORDER — TAMSULOSIN HCL 0.4 MG PO CAPS
0.4000 mg | ORAL_CAPSULE | Freq: Every day | ORAL | Status: DC
  Administered 2017-12-11: 0.4 mg via ORAL
  Filled 2017-12-11: qty 1

## 2017-12-11 MED ORDER — SODIUM CHLORIDE 0.9 % IV BOLUS
500.0000 mL | Freq: Once | INTRAVENOUS | Status: AC
  Administered 2017-12-11: 500 mL via INTRAVENOUS

## 2017-12-11 MED ORDER — ACETAMINOPHEN 325 MG PO TABS: 650.0000 mg | ORAL_TABLET | ORAL | Status: DC | PRN

## 2017-12-11 MED ORDER — OXYBUTYNIN CHLORIDE ER 15 MG PO TB24
15.0000 mg | ORAL_TABLET | Freq: Every day | ORAL | Status: DC
  Administered 2017-12-11: 15 mg via ORAL
  Filled 2017-12-11 (×2): qty 1

## 2017-12-11 MED ORDER — ROSUVASTATIN CALCIUM 20 MG PO TABS
20.0000 mg | ORAL_TABLET | Freq: Every day | ORAL | Status: DC
  Administered 2017-12-12: 20 mg via ORAL
  Filled 2017-12-11: qty 1

## 2017-12-11 MED ORDER — ESCITALOPRAM OXALATE 10 MG PO TABS
20.0000 mg | ORAL_TABLET | Freq: Every day | ORAL | Status: DC
  Administered 2017-12-12: 20 mg via ORAL
  Filled 2017-12-11: qty 2

## 2017-12-11 MED ORDER — DILTIAZEM HCL-DEXTROSE 100-5 MG/100ML-% IV SOLN (PREMIX)
5.0000 mg/h | INTRAVENOUS | Status: DC
  Administered 2017-12-11: 5 mg/h via INTRAVENOUS
  Filled 2017-12-11: qty 100

## 2017-12-11 MED ORDER — HEPARIN BOLUS VIA INFUSION
5000.0000 [IU] | Freq: Once | INTRAVENOUS | Status: AC
  Administered 2017-12-11: 5000 [IU] via INTRAVENOUS
  Filled 2017-12-11: qty 5000

## 2017-12-11 MED ORDER — ASPIRIN 81 MG PO CHEW
81.0000 mg | CHEWABLE_TABLET | Freq: Every day | ORAL | Status: DC
  Administered 2017-12-12: 81 mg via ORAL
  Filled 2017-12-11: qty 1

## 2017-12-11 MED ORDER — HEPARIN (PORCINE) IN NACL 100-0.45 UNIT/ML-% IJ SOLN
1350.0000 [IU]/h | INTRAMUSCULAR | Status: DC
  Administered 2017-12-11 – 2017-12-12 (×2): 1350 [IU]/h via INTRAVENOUS
  Filled 2017-12-11 (×2): qty 250

## 2017-12-11 MED ORDER — ONDANSETRON HCL 4 MG/2ML IJ SOLN: 4.0000 mg | Freq: Four times a day (QID) | INTRAMUSCULAR | Status: DC | PRN

## 2017-12-11 NOTE — ED Triage Notes (Addendum)
Pt to ED via EMS after episode of dizziness and diaphoresis. Pt in afib on EMS monitor with no hx. NAD, denies falling, pain, LOC. Dizziness has gotten slightly better but still present. Took 324 ASA before EMS arrival.

## 2017-12-11 NOTE — ED Provider Notes (Addendum)
[place] EMERGENCY DEPARTMENT Provider Note   CSN: [id number] Arrival date & time: 12/11/17  1255     History   Chief Complaint Chief Complaint  Patient presents with  . Atrial Fibrillation  . Dizziness    HPI [name] [name] is a 82 y.[name]. male.  The history is provided by the patient, the spouse and medical records. No language interpreter was used.  Near Syncope  This is a recurrent problem. The current episode started less than 1 hour ago. The problem occurs constantly. The problem has been rapidly improving. Pertinent negatives include no chest pain, no abdominal pain, no headaches and no shortness of breath. Nothing aggravates the symptoms. Nothing relieves the symptoms. He has tried nothing for the symptoms. The treatment provided no relief.    Past Medical History:  Diagnosis Date  . Arthritis   . CAD S/P percutaneous coronary angioplasty 08/2004   CARDIOLOGIST-  DR DAVID HARDING; DES to LCx. X2  - Cypher 2.5 mm postdilated to 2.75 mm (20 mm and 13 mm stents);; Myoview 09/2014: LOW RISK. No Infarct or Ischemia.  . Colon polyps 05/10/2005   Hyperplastic polyps  . Complication of anesthesia    "takes long time to wake up"  . Depression   . Diabetes mellitus type 2 with complications (HCC)    neuropathy; CAD borderline- no med  . Diastolic dysfunction without heart failure    Moderate LVH, Gr 1 DD on Echo 2011; no CHF admissions, no tt on diuretisc  . Essential hypertension   . Frequency of urination   . Gout   . H/[name]: gout STABLE  . Heart murmur    hx - no notable valvular lesion on Echo  . History of melanoma excision 2010   FOREHEAD  . History of prostate cancer 2004--  S/P SEED IMPLANTS    NO RECURRENCE  . Hydrocele, left   . Hyperlipidemia LDL goal <70   . Impaired hearing RIGHT HEARING AID  . OSA on CPAP    bipap  . Prostate cancer ([place])    prostate , melanoma head  . Shingles 07  . Syncope and collapse March 2017   Likely related  to post micturition, Neurontin and dehydration with orthostatic hypotension  . Urgency of urination     Patient Active Problem List   Diagnosis Date Noted  . Syncope, non cardiac 08/12/2015  . Finding of multiple premature atrial contractions by electrocardiography 08/12/2015  . Diabetes type 2, uncontrolled ([place])   . Thoracic spine tumor 12/12/2014  . Neurogenic bowel 12/12/2014  . Incomplete paraplegia ([place]) 12/12/2014  . Back pain 12/09/2014  . Malignant tumor spinal cord ([place]) 12/09/2014  . Other fatigue 09/17/2014  . 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 Hereditary and idiopathic peripheral neuropathy 11/01/2013  . Diastolic dysfunction without heart failure   . Obesity (BMI 30-39.9) 03/22/2013  . OSA on CPAP 03/19/2012  . H/[name] prostate cancer 03/19/2012  . Essential hypertension 03/19/2012  . Hyperlipidemia LDL goal <70 03/19/2012  . H/[name] hydrocele 03/19/2012  . Hx of melanoma excision 03/19/2012  . Personal history of colonic polyps 03/19/2012  . CAD S/P percutaneous coronary angioplasty: Cypher DES x2 to circumflex 08/17/2004    Past Surgical History:  Procedure Laterality Date  . Cardiac Event Monitor  3-07/2015   Mostly SR - 58-132 bpm. Rare PVCs (occ bigeminy), Frequent PACs - singlets, couplets - short runs of PAT.  . circumsision    . CORONARY ANGIOPLASTY WITH STENT PLACEMENT  09-01-2004     Cypher DES x 2 d-m LCx 2.5 mm x 20 mm & 2.5 mm x 13 mm (~2.75 mm)  . EXCISION MELANOMA FROM FOREHEAD  2010  . EYE SURGERY Bilateral    cataracts  . HYDROCELE EXCISION  10/14/2011   Procedure: HYDROCELECTOMY ADULT;  Surgeon: Ailene Rud, MD;  Location: [facility];  Service: Urology;  Laterality: Left;  . LAMINECTOMY N/A 07/13/2012   Procedure: THORACIC LAMINECTOMY FOR RESECTION OF INTRAMEDULLARY SPINAL CHORD TUMOR WITH SPINAL CHORD MONITORING;  Surgeon: Erline Levine, MD;  Location: [facility];  Service: Neurosurgery;  Laterality: N/A;  Thoracic  laminectomy for resection of intramedullary spinal cord tumor with spinal cord monitering and Dr. Christella Noa to assist  . LAMINECTOMY N/A 12/09/2014   Procedure: Redo Thoracic laminectomy with intramedullary tumar resection/USN/[place]/Subarachnoid shunt/spinal cord monitoring/Dr. Kathyrn Sheriff to assist;  Surgeon: Erline Levine, MD;  Location: [place] [facility];  Service: Neurosurgery;  Laterality: N/A;  Redo Thoracic laminectomy with intramedullary tumar resection/USN/[place]/Subarachnoid shunt/spinal cord monitoring/Dr. Kathyrn Sheriff to assist  . NM MYOVIEW LTD  3/212014; June 2016   Both Lexiscan: a. LOW RISK, mild inferior bowel artifact; No ischemia or Infarction; b. Low risk, normal study. No infarction or ischemia. No artifact.  Marland Kitchen PROSTATE PALLADIUM GOLD SEED IMPLANTS (118)  11-15-2002   PROSTATE CANCER  . SKIN BIOPSY    . TONSILLECTOMY    . TRANSTHORACIC ECHOCARDIOGRAM  MAR 07,2011   NORMAL EF > 55% / NORMAL FUNCTION WITH IMPAIRED RELAXATION AND MODERATE CONCENTRIC LVH LIKELY HYPERTENSIVE DISEASE        Home Medications    Prior to Admission medications   Medication Sig Start Date End Date Taking? Authorizing Provider  allopurinol (ZYLOPRIM) 300 MG tablet Take 1 tablet (300 mg total) by mouth daily. 12/25/14   Angiulli, Lavon Paganini, PA-C  amLODipine (NORVASC) 10 MG tablet Take 10 mg by mouth daily. 10/21/15   [provider]  clopidogrel (PLAVIX) 75 MG tablet Take 1 tablet (75 mg total) by mouth daily. 12/25/14   Angiulli, Lavon Paganini, PA-C  escitalopram (LEXAPRO) 20 MG tablet Take 20 mg by mouth daily. 10/21/15   [provider]  metFORMIN (GLUCOPHAGE) 500 MG tablet TAKE 1 TABLET BY MOUTH TWICE A DAY WITH A MEAL 04/27/15   Kirsteins, Luanna Salk, MD  oxybutynin (DITROPAN XL) 15 MG 24 hr tablet Take 1 tablet (15 mg total) by mouth at bedtime. 12/25/14   Angiulli, Lavon Paganini, PA-C  rosuvastatin (CRESTOR) 20 MG tablet Take 1 tablet (20 mg total) by mouth daily. 12/25/14   Angiulli, Lavon Paganini, PA-C    tamsulosin (FLOMAX) 0.4 MG CAPS capsule Take 1 capsule (0.4 mg total) by mouth at bedtime. 12/25/14   Angiulli, Lavon Paganini, PA-C  valsartan (DIOVAN) 80 MG tablet Take 1 tablet (80 mg total) by mouth every evening. 08/11/15   Leonie Man, MD  VITAMIN D, CHOLECALCIFEROL, PO Take 1 tablet by mouth daily.     [provider]    Family History Family History  Problem Relation Age of Onset  . Hypertension Mother   . Stroke Mother   . Hypertension Father   . Prostate cancer Father   . Hypertension Brother   . Prostate cancer Brother     Social History Social History   Tobacco Use  . Smoking status: Never Smoker  . Smokeless tobacco: Never Used  . Tobacco comment: occ alcohol  Substance Use Topics  . Alcohol use: Yes    Comment: OCCASIONAL  .  Drug use: No     Allergies   Betadine [povidone iodine]; Contrast media [iodinated diagnostic agents]; Iodine; and Shellfish allergy   Review of Systems Review of Systems  Constitutional: Positive for diaphoresis and fatigue. Negative for chills and fever.  HENT: Negative for congestion.   Eyes: Negative for visual disturbance.  Respiratory: Negative for cough, chest tightness, shortness of breath and wheezing.   Cardiovascular: Positive for leg swelling (chronic mild) and near-syncope. Negative for chest pain and palpitations.  Gastrointestinal: Negative for abdominal pain, constipation, diarrhea, nausea and vomiting.  Genitourinary: Negative for flank pain.  Musculoskeletal: Negative for back pain and neck pain.  Skin: Negative for rash and wound.  Neurological: Positive for light-headedness and numbness (chronic). Negative for dizziness, seizures, syncope, weakness and headaches.  All other systems reviewed and are negative.    Physical Exam Updated Vital Signs BP 135/73   Pulse 79   Temp 97.8 F (36.6 C) (Oral)   Resp 16   Ht 5\' 9"  (1.753 m)   Wt 94.3 kg   BMI 30.72 kg/m   Physical Exam  Constitutional: He is  oriented to person, place, and time. He appears well-developed and well-nourished. No distress.  HENT:  Head: Normocephalic and atraumatic.  Mouth/Throat: Oropharynx is clear and moist. No oropharyngeal exudate.  Eyes: Pupils are equal, round, and reactive to light. Conjunctivae and EOM are normal.  Neck: Normal range of motion. Neck supple.  Cardiovascular: Normal rate and regular rhythm.  Murmur (mild) heard. Pulmonary/Chest: Effort normal and breath sounds normal. No respiratory distress. He has no wheezes. He has no rales. He exhibits no tenderness.  Abdominal: Soft. There is no tenderness.  Musculoskeletal: He exhibits no edema or tenderness.  Neurological: He is alert and oriented to person, place, and time. He is not disoriented. He displays no tremor. A sensory deficit (legs numb bilaterally, reportedly unchanged) is present. No cranial nerve deficit. He exhibits normal muscle tone. Coordination normal. GCS eye subscore is 4. GCS verbal subscore is 5. GCS motor subscore is 6.  Skin: Skin is warm and dry. Capillary refill takes less than 2 seconds. He is not diaphoretic. No erythema. No pallor.  Psychiatric: He has a normal mood and affect.  Nursing note and vitals reviewed.    ED Treatments / Results  Labs (all labs ordered are listed, but only abnormal results are displayed) Labs Reviewed  CBC WITH DIFFERENTIAL/PLATELET - Abnormal; Notable for the following components:      Result Value   WBC 10.8 (*)    Neutro Abs 8.8 (*)    All other components within normal limits  COMPREHENSIVE METABOLIC PANEL - Abnormal; Notable for the following components:   Glucose, Bld 138 (*)    Creatinine, Ser 1.39 (*)    Calcium 8.5 (*)    Total Protein 5.9 (*)    GFR calc non Af Amer 47 (*)    GFR calc Af Amer 54 (*)    All other components within normal limits  URINALYSIS, ROUTINE W REFLEX MICROSCOPIC - Abnormal; Notable for the following components:   APPearance HAZY (*)    Protein, ur 100  (*)    All other components within normal limits  BRAIN NATRIURETIC PEPTIDE - Abnormal; Notable for the following components:   B Natriuretic Peptide 414.6 (*)    All other components within normal limits  BASIC METABOLIC PANEL - Abnormal; Notable for the following components:   Glucose, Bld 129 (*)    Creatinine, Ser 1.27 (*)  Calcium 8.7 (*)    GFR calc non Af Amer 52 (*)    All other components within normal limits  URINE CULTURE  MAGNESIUM  PROTIME-INR  TSH  TSH  TROPONIN I  HEPARIN LEVEL (UNFRACTIONATED)  HEPARIN LEVEL (UNFRACTIONATED)  CBC  TROPONIN I  TROPONIN I  I-STAT TROPONIN, ED  I-STAT TROPONIN, ED    EKG EKG Interpretation  Date/Time:  Monday December 11 2017 15:21:06 EDT Ventricular Rate:  85 PR Interval:    QRS Duration: 90 QT Interval:  411 QTC Calculation: 489 R Axis:   -34 Text Interpretation:  Atrial fibrillation Left axis deviation Abnormal R-wave progression, late transition Borderline prolonged QT interval Baseline wander in lead(s) V1 When compared to prior, new afib today.  No STEMI Confirmed by Antony Blackbird [phone]) on 12/11/2017 3:34:23 PM   Radiology Dg Chest 2 View  Result Date: 12/11/2017 CLINICAL DATA:  Dizziness, diaphoresis, AFib EXAM: CHEST - 2 VIEW COMPARISON:  CT chest dated 06/27/2012 FINDINGS: Lungs are clear.  No pleural effusion or pneumothorax. The heart is normal in size. Degenerative changes of the visualized thoracolumbar spine. IMPRESSION: No evidence of acute cardiopulmonary disease. Electronically Signed   By: Julian Hy M.D.   On: 12/11/2017 15:12    Procedures Procedures (including critical care time)  CRITICAL CARE Performed by: Gwenyth Allegra Shammara Jarrett Total critical care time: 40 minutes Critical care time was exclusive of separately billable procedures and treating other patients. Critical care was necessary to treat or prevent imminent or life-threatening deterioration. Critical care was time spent  personally by me on the following activities: development of treatment plan with patient and/or surrogate as well as nursing, discussions with consultants, evaluation of patient's response to treatment, examination of patient, obtaining history from patient or surrogate, ordering and performing treatments and interventions, ordering and review of laboratory studies, ordering and review of radiographic studies, pulse oximetry and re-evaluation of patient's condition.   Medications Ordered in ED Medications  diltiazem (CARDIZEM) 100 mg in dextrose 5% 133mL (1 mg/mL) infusion (5 mg/hr Intravenous Handoff 12/11/17 1911)  heparin ADULT infusion 100 units/mL (25000 units/272mL sodium chloride 0.45%) (1,350 Units/hr Intravenous Handoff 12/11/17 1910)  aspirin chewable tablet 81 mg (has no administration in time range)  rosuvastatin (CRESTOR) tablet 20 mg (has no administration in time range)  escitalopram (LEXAPRO) tablet 20 mg (has no administration in time range)  oxybutynin (DITROPAN XL) 24 hr tablet 15 mg (15 mg Oral Given 12/11/17 2238)  tamsulosin (FLOMAX) capsule 0.4 mg (0.4 mg Oral Given 12/11/17 2238)  acetaminophen (TYLENOL) tablet 650 mg (has no administration in time range)  ondansetron (ZOFRAN) injection 4 mg (has no administration in time range)  sodium chloride 0.9 % bolus 500 mL (0 mLs Intravenous Stopped 12/11/17 1508)  heparin bolus via infusion 5,000 Units (5,000 Units Intravenous Bolus from Bag 12/11/17 1757)     Initial Impression / Assessment and Plan / ED Course  I have reviewed the triage vital signs and the nursing notes.  Pertinent labs & imaging results that were available during my care of the patient were reviewed by me and considered in my medical decision making (see chart for details).     OVILA [name] is a 82 y.[name]. male with a past medical history significant for hypertension, CAD status post CABG, CHF, hyperlipidemia, and diabetes who presents with an episode of near  syncope, extreme lightheadedness, and diaphoresis.  Patient reports that he has been feeling well over the last few days but has had increase  in stress.  He reports he just returned from a trip yesterday and had some increase in stress with car problems.  He says that today while working at his desk, he had sudden onset of extreme lightheadedness and near syncope.  He reports he stood up and try to go to the bedroom when his wife found him somnolent but still standing.  She will get into the bed and he reports feeling better.  He denied any chest pain or palpitations.  He denies any nausea or vomiting.  He denies any other complaints aside from being covered in sweat.  Patient reports that he had an episode like this several years ago which they thought may have been atrial fibrillation however it ended up not being so.  He says he has not had any palpitations with this.  Patient took 324 of aspirin and was brought in by EMS.  On arrival, patient is feeling much better.  He still feels slightly lightheaded but is not feeling the near syncopal sensation.  He has no other complaints on arrival.  On exam, lungs are clear.  Very faint systolic murmur appreciated.  Chest is nontender.  Abdomen is nontender.  Patient has very mild edema in legs which she reports has been slightly worsened recently.  He has some numbness in his legs bilaterally he reports is unchanged for years.  Had normal sensation and strength in his arms.  Patient is alert and oriented.  Next  Initial EKG shows atrial fibrillation.  Patient is not in RVR.  No STEMI seen.  Next  Clinically I am concerned patient has new atrial fibrillation that likely went into RVR earlier causing his episode.  Patient will screen laboratory testing performed.  Given how the patient looked with EMS, anticipate patient may require admission for the new atrial fibrillation.  As he does not have palpitations shortness breath or chest pain, it is unclear when he  went into A. Fib.  Anticipate speaking with cardiology after work-up is completed.  5:26 PM Cardiology will come see patient.  Laboratory testing shows evidence of elevated BNP, no prior to compare.  While in the ED, patient had several episodes of tachycardia with rates in the 120s and 130s.  I suspect he is going in and out of A. fib with RVR.  Anticipate following up on cardiology recommendations for his new A. fib with RVR and elevated BNP likely suggesting mild CHF exacerbation.  Cardiology saw the patient and will admit for further management.  Patient admitted in stable condition.  Final Clinical Impressions(s) / ED Diagnoses   Final diagnoses:  Atrial fibrillation, unspecified type ([place])  Near syncope    Clinical Impression: 1. Atrial fibrillation, unspecified type ([place])   2. Near syncope     Disposition: Admit  This note was prepared with assistance of Systems analyst. Occasional wrong-word or sound-a-like substitutions may have occurred due to the inherent limitations of voice recognition software.      Beren Yniguez, Gwenyth Allegra, MD 12/11/17 2336    Kirt Chew, Gwenyth Allegra, MD 12/19/17 2034

## 2017-12-11 NOTE — H&P (Signed)
Patient ID: [name] MRN: [id number], DOB/AGE: 1936/02/07   Admit date: 12/11/2017  Primary Physician: Lavone Orn, MD Primary Cardiologist: Dr. Ellyn Hack  Pt. Profile:  Atrial fibrillation with RVR  Problem List  Past Medical History:  Diagnosis Date  . Arthritis   . CAD S/P percutaneous coronary angioplasty 08/2004   CARDIOLOGIST-  DR DAVID HARDING; DES to LCx. X2  - Cypher 2.5 mm postdilated to 2.75 mm (20 mm and 13 mm stents);; Myoview 09/2014: LOW RISK. No Infarct or Ischemia.  . Colon polyps 05/10/2005   Hyperplastic polyps  . Complication of anesthesia    "takes long time to wake up"  . Depression   . Diabetes mellitus type 2 with complications (HCC)    neuropathy; CAD borderline- no med  . Diastolic dysfunction without heart failure    Moderate LVH, Gr 1 DD on Echo 2011; no CHF admissions, no tt on diuretisc  . Essential hypertension   . Frequency of urination   . Gout   . H/O: gout STABLE  . Heart murmur    hx - no notable valvular lesion on Echo  . History of melanoma excision 2010   FOREHEAD  . History of prostate cancer 2004--  S/P SEED IMPLANTS    NO RECURRENCE  . Hydrocele, left   . Hyperlipidemia LDL goal <70   . Impaired hearing RIGHT HEARING AID  . OSA on CPAP    bipap  . Prostate cancer ([place])    prostate , melanoma head  . Shingles 07  . Syncope and collapse March 2017   Likely related to post micturition, Neurontin and dehydration with orthostatic hypotension  . Urgency of urination     Past Surgical History:  Procedure Laterality Date  . Cardiac Event Monitor  3-07/2015   Mostly SR - 58-132 bpm. Rare PVCs (occ bigeminy), Frequent PACs - singlets, couplets - short runs of PAT.  . circumsision    . CORONARY ANGIOPLASTY WITH STENT PLACEMENT  09-01-2004     Cypher DES x 2 d-m LCx 2.5 mm x 20 mm & 2.5 mm x 13 mm (~2.75 mm)  . EXCISION MELANOMA FROM FOREHEAD  2010  . EYE SURGERY Bilateral    cataracts  . HYDROCELE EXCISION  10/14/2011   Procedure: HYDROCELECTOMY ADULT;  Surgeon: Ailene Rud, MD;  Location: [facility];  Service: Urology;  Laterality: Left;  . LAMINECTOMY N/A 07/13/2012   Procedure: THORACIC LAMINECTOMY FOR RESECTION OF INTRAMEDULLARY SPINAL CHORD TUMOR WITH SPINAL CHORD MONITORING;  Surgeon: Erline Levine, MD;  Location: [facility];  Service: Neurosurgery;  Laterality: N/A;  Thoracic laminectomy for resection of intramedullary spinal cord tumor with spinal cord monitering and Dr. Christella Noa to assist  . LAMINECTOMY N/A 12/09/2014   Procedure: Redo Thoracic laminectomy with intramedullary tumar resection/USN/[place]/Subarachnoid shunt/spinal cord monitoring/Dr. Kathyrn Sheriff to assist;  Surgeon: Erline Levine, MD;  Location: [facility];  Service: Neurosurgery;  Laterality: N/A;  Redo Thoracic laminectomy with intramedullary tumar resection/USN/[place]/Subarachnoid shunt/spinal cord monitoring/Dr. Kathyrn Sheriff to assist  . NM MYOVIEW LTD  3/212014; June 2016   Both Lexiscan: a. LOW RISK, mild inferior bowel artifact; No ischemia or Infarction; b. Low risk, normal study. No infarction or ischemia. No artifact.  Marland Kitchen PROSTATE PALLADIUM GOLD SEED IMPLANTS (118)  11-15-2002   PROSTATE CANCER  . SKIN BIOPSY    . TONSILLECTOMY    . TRANSTHORACIC ECHOCARDIOGRAM  MAR 83,4196   NORMAL EF > 55% / NORMAL FUNCTION WITH IMPAIRED RELAXATION AND MODERATE CONCENTRIC LVH  LIKELY HYPERTENSIVE DISEASE    Allergies  Allergies  Allergen Reactions  . Betadine [Povidone Iodine] Anaphylaxis  . Contrast Media [Iodinated Diagnostic Agents] Anaphylaxis and Rash    Other Reaction: Other reaction  . Iodine Anaphylaxis    Shellfish, IVP dye. Swelling in throat and foaming at mouth.  . Shellfish Allergy Anaphylaxis   HPI  Patient is a 82 y.o. male with a PMHx of CAD, diastolic dysfunction without CHF, hyperlipidemia, who was admitted to [facility] on 12/11/2017 for evaluation of atrial fibrillation with RVR.  The patient has a distant  history of PCI in 2006 with Cypher DES stent, negative nuclear stress test in 2016 with no recurrent angina or heart failure symptoms.  The patient has been on long-term Plavix but no aspirin.  He has also been on amlodipine and valsartan for hyperlipidemia.  Previously on beta-blockers but they cause significant fatigue.  Patient is on Crestor for hyperlipidemia.  He has been followed by Dr. Ellyn Hack, last seen on 01/06/2017 he was doing well.  There is no echocardiogram in epic. The patient states that he had a stressful day yesterday, he flew to [facility] for gambling, he flew back and his car broke on the way home.  Today while in his office he developed sudden onset dizziness and diaphoresis and EMS was called, he was told he is in atrial fibrillation with RVR.  He is currently asymptomatic denies any chest pain shortness of breath.  He states that he had similar episode several years ago but by the time EMS arrived he was in sinus rhythm.  He has diagnosed sleep apnea however does not use CPAP machine as he does not tolerate it. Creatinine today 1.39, previously 1.4 in 2017.  Normal electrolytes.  Normal troponin, BNP 414.  Home Medications  Prior to Admission medications   Medication Sig Start Date End Date Taking? Authorizing Provider  allopurinol (ZYLOPRIM) 300 MG tablet Take 1 tablet (300 mg total) by mouth daily. 12/25/14  Yes Angiulli, Lavon Paganini, PA-C  amLODipine (NORVASC) 10 MG tablet Take 10 mg by mouth daily. 10/21/15  Yes [provider]  aspirin 81 MG chewable tablet Chew 81 mg by mouth daily.   Yes [provider]  clopidogrel (PLAVIX) 75 MG tablet Take 1 tablet (75 mg total) by mouth daily. 12/25/14  Yes Angiulli, Lavon Paganini, PA-C  escitalopram (LEXAPRO) 20 MG tablet Take 20 mg by mouth daily. 10/21/15  Yes [provider]  metFORMIN (GLUCOPHAGE) 500 MG tablet TAKE 1 TABLET BY MOUTH TWICE A DAY WITH A MEAL Patient taking differently: Take 500 mg by mouth 2 (two)  times daily with a meal.  04/27/15  Yes Kirsteins, Luanna Salk, MD  oxybutynin (DITROPAN XL) 15 MG 24 hr tablet Take 1 tablet (15 mg total) by mouth at bedtime. 12/25/14  Yes Angiulli, Lavon Paganini, PA-C  rosuvastatin (CRESTOR) 20 MG tablet Take 1 tablet (20 mg total) by mouth daily. 12/25/14  Yes Angiulli, Lavon Paganini, PA-C  tamsulosin (FLOMAX) 0.4 MG CAPS capsule Take 1 capsule (0.4 mg total) by mouth at bedtime. 12/25/14  Yes Angiulli, Lavon Paganini, PA-C  valsartan (DIOVAN) 80 MG tablet Take 1 tablet (80 mg total) by mouth every evening. 08/11/15  Yes Leonie Man, MD  VITAMIN D, CHOLECALCIFEROL, PO Take 1 tablet by mouth daily.    Yes [provider]    Family History  Family History  Problem Relation Age of Onset  . Hypertension Mother   . Stroke Mother   .  Hypertension Father   . Prostate cancer Father   . Hypertension Brother   . Prostate cancer Brother     Social History  Social History   Socioeconomic History  . Marital status: Married    Spouse name: Not on file  . Number of children: Not on file  . Years of education: Not on file  . Highest education level: Not on file  Occupational History  . Not on file  Social Needs  . Financial resource strain: Not on file  . Food insecurity:    Worry: Not on file    Inability: Not on file  . Transportation needs:    Medical: Not on file    Non-medical: Not on file  Tobacco Use  . Smoking status: Never Smoker  . Smokeless tobacco: Never Used  . Tobacco comment: occ alcohol  Substance and Sexual Activity  . Alcohol use: Yes    Comment: OCCASIONAL  . Drug use: No  . Sexual activity: Not on file  Lifestyle  . Physical activity:    Days per week: Not on file    Minutes per session: Not on file  . Stress: Not on file  Relationships  . Social connections:    Talks on phone: Not on file    Gets together: Not on file    Attends religious service: Not on file    Active member of club or organization: Not on file    Attends  meetings of clubs or organizations: Not on file    Relationship status: Not on file  . Intimate partner violence:    Fear of current or ex partner: Not on file    Emotionally abused: Not on file    Physically abused: Not on file    Forced sexual activity: Not on file  Other Topics Concern  . Not on file  Social History Narrative   He is married, father of 2, grandfather of 19. Does not really get exercise now because   of his back issues. Does not smoke and drinks social alcohol. Works in [employer].     Review of Systems General:  No chills, fever, night sweats or weight changes.  Cardiovascular:  No chest pain, dyspnea on exertion, edema, orthopnea, palpitations, paroxysmal nocturnal dyspnea. Dermatological: No rash, lesions/masses Respiratory: No cough, dyspnea Urologic: No hematuria, dysuria Abdominal:   No nausea, vomiting, diarrhea, bright red blood per rectum, melena, or hematemesis Neurologic:  No visual changes, wkns, changes in mental status. All other systems reviewed and are otherwise negative except as noted above.  Physical Exam  Blood pressure 133/77, pulse 82, temperature 97.8 F (36.6 C), temperature source Oral, resp. rate 20, height 5\' 9"  (1.753 m), weight 94.3 kg.  General: Pleasant, NAD Psych: Normal affect. Neuro: Alert and oriented X 3. Moves all extremities spontaneously. HEENT: Normal  Neck: Supple without bruits or JVD. Lungs:  Resp regular and unlabored, CTA. Heart: RRR no s3, s4, or murmurs. Abdomen: Soft, non-tender, non-distended, BS + x 4.  Extremities: No clubbing, cyanosis or edema. DP/PT/Radials 2+ and equal bilaterally.  Labs  No results for input(s): CKTOTAL, CKMB, TROPONINI in the last 72 hours. Lab Results  Component Value Date   WBC 10.8 (H) 12/11/2017   HGB 14.1 12/11/2017   HCT 43.2 12/11/2017   MCV 90.2 12/11/2017   PLT 189 12/11/2017    Recent Labs  Lab 12/11/17 1400  NA 139  K 4.2  CL 104  CO2 26  BUN 20  CREATININE 1.39*  CALCIUM 8.5*  PROT 5.9*  BILITOT 0.5  ALKPHOS 46  ALT 17  AST 22  GLUCOSE 138*   Lab Results  Component Value Date   CHOL 112 04/16/2013   HDL 31 (L) 04/16/2013   LDLCALC 52 04/16/2013   TRIG 143 04/16/2013   No results found for: DDIMER Invalid input(s): POCBNP   Radiology/Studies  Dg Chest 2 View  Result Date: 12/11/2017 CLINICAL DATA:  Dizziness, diaphoresis, AFib EXAM: CHEST - 2 VIEW COMPARISON:  CT chest dated 06/27/2012 FINDINGS: Lungs are clear.  No pleural effusion or pneumothorax. The heart is normal in size. Degenerative changes of the visualized thoracolumbar spine. IMPRESSION: No evidence of acute cardiopulmonary disease. Electronically Signed   By: Julian Hy M.D.   On: 12/11/2017 15:12    Echocardiogram -pending   ECG: EKG today shows atrial fibrillation, no acute ST-T wave abnormalities, previously sinus bradycardia with PACs, personally reviewed.   ASSESSMENT AND PLAN  1.  New onset atrial fibrillation, currently rate controlled, I will start Cardizem drip and heparin drip. Anticoagulation was discussed, we will start him on Eliquis 5 mg p.o. twice daily.  We will discontinue Plavix. This patients CHA2DS2-VASc Score and unadjusted Ischemic Stroke Rate (% per year) is equal to 3.2 % stroke rate/year from a score of 3 Above score calculated as 1 point each if present [CHF, HTN, DM, Vascular=MI/PAD/Aortic Plaque, Age if 65-74, or Male] Above score calculated as 2 points each if present [Age > 75, or Stroke/TIA/TE] We will obtain an echocardiogram, if he does not cardiovert on his own we will plan for cardioversion after 4 weeks of full anticoagulation.  I will consider switching amlodipine to Cardizem on discharge.  He is intolerant to beta-blockers.  2.  CAD, status post PCI with DES in 2006, he is asymptomatic.  We will continue cycle troponins.  3.  Hypertension -controlled, will consider switching amlodipine to Cardizem.  4.   Hyperlipidemia -on Crestor that is tolerated well.  DVT PPX -we will start heparin drip IV.   Signed, Ena Dawley, MD, [facility] 12/11/2017, 4:59 PM

## 2017-12-11 NOTE — ED Notes (Signed)
Pt informed he needs to provide a UA when possible. Pt given urinal / verbalized understanding.

## 2017-12-11 NOTE — ED Notes (Signed)
Patient transported to X-ray 

## 2017-12-11 NOTE — Progress Notes (Signed)
ANTICOAGULATION CONSULT NOTE - Follow Up Consult  Pharmacy Consult for heparin Indication: atrial fibrillation  Allergies  Allergen Reactions  . Betadine [Povidone Iodine] Anaphylaxis  . Contrast Media [Iodinated Diagnostic Agents] Anaphylaxis and Rash    Other Reaction: Other reaction  . Iodine Anaphylaxis    Shellfish, IVP dye. Swelling in throat and foaming at mouth.  . Shellfish Allergy Anaphylaxis    Patient Measurements: Height: 5\' 9"  (175.3 cm) Weight: 208 lb (94.3 kg) IBW/kg (Calculated) : 70.7 Heparin Dosing Weight: 90.2  Vital Signs: Temp: 97.8 F (36.6 C) (09/09 1303) Temp Source: Oral (09/09 1303) BP: 140/66 (09/09 1700) Pulse Rate: 86 (09/09 1700)  Labs: Recent Labs    12/11/17 1400  HGB 14.1  HCT 43.2  PLT 189  LABPROT 13.6  INR 1.05  CREATININE 1.39*    Estimated Creatinine Clearance: 48.8 mL/min (A) (by C-G formula based on SCr of 1.39 mg/dL (H)).   Medications:    Assessment: 82 yo M starting on heparin gtt for new Afib with RVR. Not on anticoagulation PTA. No bleeding documented. CHADSVASC: 3  Goal of Therapy:  Heparin level 0.3-0.7 units/ml Monitor platelets by anticoagulation protocol: Yes   Plan:  Heparin bolus 5000 units IV x1 Heparin 1350 unit/hr IV infusion 8 hr heparin level Daily CBC, heparin level, s/s bleeding F/u possible Eliquis initiation  Harrietta Guardian, PharmD PGY1 Pharmacy Resident Direct Phone [phone] 12/11/2017    6:39 PM

## 2017-12-12 ENCOUNTER — Encounter (HOSPITAL_COMMUNITY): Payer: Self-pay

## 2017-12-12 ENCOUNTER — Inpatient Hospital Stay (HOSPITAL_COMMUNITY): Payer: Medicare Other

## 2017-12-12 DIAGNOSIS — I4891 Unspecified atrial fibrillation: Secondary | ICD-10-CM

## 2017-12-12 DIAGNOSIS — Z7901 Long term (current) use of anticoagulants: Secondary | ICD-10-CM

## 2017-12-12 DIAGNOSIS — I480 Paroxysmal atrial fibrillation: Secondary | ICD-10-CM

## 2017-12-12 DIAGNOSIS — Z955 Presence of coronary angioplasty implant and graft: Secondary | ICD-10-CM | POA: Diagnosis not present

## 2017-12-12 DIAGNOSIS — I10 Essential (primary) hypertension: Secondary | ICD-10-CM | POA: Diagnosis not present

## 2017-12-12 DIAGNOSIS — Z885 Allergy status to narcotic agent status: Secondary | ICD-10-CM | POA: Diagnosis not present

## 2017-12-12 DIAGNOSIS — I2510 Atherosclerotic heart disease of native coronary artery without angina pectoris: Secondary | ICD-10-CM | POA: Diagnosis not present

## 2017-12-12 DIAGNOSIS — D6869 Other thrombophilia: Secondary | ICD-10-CM

## 2017-12-12 DIAGNOSIS — E785 Hyperlipidemia, unspecified: Secondary | ICD-10-CM | POA: Diagnosis not present

## 2017-12-12 LAB — CBC
HCT: 40.3 % (ref 39.0–52.0)
Hemoglobin: 13.1 g/dL (ref 13.0–17.0)
MCH: 28.8 pg (ref 26.0–34.0)
MCHC: 32.5 g/dL (ref 30.0–36.0)
MCV: 88.6 fL (ref 78.0–100.0)
Platelets: 172 10*3/uL (ref 150–400)
RBC: 4.55 MIL/uL (ref 4.22–5.81)
RDW: 13.8 % (ref 11.5–15.5)
WBC: 8.8 10*3/uL (ref 4.0–10.5)

## 2017-12-12 LAB — URINE CULTURE: Culture: <10000 — AB

## 2017-12-12 LAB — TROPONIN I
Troponin I: <0.03 ng/mL (ref <0.03)
Troponin I: <0.03 ng/mL (ref <0.03)

## 2017-12-12 LAB — ECHOCARDIOGRAM COMPLETE
Height: 68 in
Weight: 3376 oz

## 2017-12-12 LAB — HEPARIN LEVEL (UNFRACTIONATED): Heparin Unfractionated: 0.67 IU/mL (ref 0.30–0.70)

## 2017-12-12 MED ORDER — ACETAMINOPHEN 325 MG PO TABS: 650.0000 mg | ORAL_TABLET | ORAL | Status: DC | PRN

## 2017-12-12 MED ORDER — APIXABAN 5 MG PO TABS: 5.0000 mg | ORAL_TABLET | Freq: Two times a day (BID) | ORAL | 11 refills | Status: DC

## 2017-12-12 MED ORDER — APIXABAN 5 MG PO TABS
5.0000 mg | ORAL_TABLET | Freq: Two times a day (BID) | ORAL | Status: DC
  Administered 2017-12-12: 5 mg via ORAL
  Filled 2017-12-12: qty 1

## 2017-12-12 MED ORDER — DILTIAZEM HCL ER COATED BEADS 180 MG PO CP24: 180.0000 mg | ORAL_CAPSULE | Freq: Every day | ORAL | 6 refills | Status: DC

## 2017-12-12 MED ORDER — DILTIAZEM HCL ER COATED BEADS 180 MG PO CP24
180.0000 mg | ORAL_CAPSULE | Freq: Every day | ORAL | Status: DC
  Administered 2017-12-12: 180 mg via ORAL
  Filled 2017-12-12: qty 1

## 2017-12-12 MED ORDER — METFORMIN HCL 500 MG PO TABS: 500.0000 mg | ORAL_TABLET | Freq: Two times a day (BID) | ORAL | Status: DC

## 2017-12-12 MED ORDER — APIXABAN 5 MG PO TABS: 5.0000 mg | ORAL_TABLET | Freq: Two times a day (BID) | ORAL | 0 refills | Status: DC

## 2017-12-12 NOTE — Care Management (Signed)
1411 12-12-17 Jacqlyn Krauss, RN,BSN [phone] Benefits check completed for Eliquis. Pharmacy did provide patient with 30 day free Eliquis Card. No further needs at this time. Graves-Bigelow, Ocie Cornfield, RN BSN Case Manager

## 2017-12-12 NOTE — Progress Notes (Signed)
ANTICOAGULATION CONSULT NOTE - Follow Up Consult  Pharmacy Consult for heparin Indication: atrial fibrillation  Allergies  Allergen Reactions  . Betadine [Povidone Iodine] Anaphylaxis  . Contrast Media [Iodinated Diagnostic Agents] Anaphylaxis and Rash    Other Reaction: Other reaction  . Iodine Anaphylaxis    Shellfish, IVP dye. Swelling in throat and foaming at mouth.  . Shellfish Allergy Anaphylaxis    Patient Measurements: Height: 5\' 8"  (172.7 cm) Weight: 208 lb (94.3 kg) IBW/kg (Calculated) : 68.4 Heparin Dosing Weight: 90.2  Vital Signs: Temp: 97.7 F (36.5 C) (09/09 2313) Temp Source: Oral (09/09 2313) BP: 142/74 (09/09 2313) Pulse Rate: 52 (09/09 2313)  Labs: Recent Labs    12/11/17 1400 12/11/17 1905 12/12/17 0218  HGB 14.1  --   --   HCT 43.2  --   --   PLT 189  --   --   LABPROT 13.6  --   --   INR 1.05  --   --   HEPARINUNFRC  --   --  0.67  CREATININE 1.39* 1.27*  --   TROPONINI  --  <0.03 <0.03    Estimated Creatinine Clearance: 52.6 mL/min (A) (by C-G formula based on SCr of 1.27 mg/dL (H)).   Medications:    Assessment: 82 yo M starting on heparin gtt for new Afib with RVR. Not on anticoagulation PTA. No bleeding documented. CHADSVASC: 3 Initial HL 0.67 units/ml  Goal of Therapy:  Heparin level 0.3-0.7 units/ml Monitor platelets by anticoagulation protocol: Yes   Plan:  Continue Heparin 1350 unit/hr IV infusion F/u possible Eliquis initiation  Thanks for allowing pharmacy to be a part of this patient's care.  Excell Seltzer, PharmD Clinical Pharmacist

## 2017-12-12 NOTE — Progress Notes (Signed)
Patient HR in 50's. Stopped Cardizem. Patient in and out of Afib. MD on call notified. Will continue to monitor.   Carson Myrtle, RN

## 2017-12-12 NOTE — Discharge Summary (Signed)
Discharge Summary    Patient ID: [name] MRN: [id number]; DOB: 11/06/1935  Admit date: 12/11/2017 Discharge date: 12/12/2017  Primary Care Provider: Lavone Orn, MD  Primary Cardiologist: Glenetta Hew, MD Primary Electrophysiologist:  None   Discharge Diagnoses    Principal Problem:   Atrial fibrillation with RVR ([place]) converted with IV dilt to SR Active Problems:   CAD S/P percutaneous coronary angioplasty: Cypher DES x2 to circumflex   Essential hypertension   Hyperlipidemia LDL goal <70   Anticoagulation adequate, Eliquis with [name]  Allergies Allergies  Allergen Reactions  . Betadine [Povidone Iodine] Anaphylaxis  . Contrast Media [Iodinated Diagnostic Agents] Anaphylaxis and Rash    Other Reaction: Other reaction  . Iodine Anaphylaxis    Shellfish, IVP dye. Swelling in throat and foaming at mouth.  Marland Kitchen Shellfish Allergy Anaphylaxis    Diagnostic Studies/Procedures    Echo  Study Conclusions  - Left ventricle: The cavity size was normal. There was moderate   concentric hypertrophy. Systolic function was normal. The   estimated ejection fraction was in the range of 55% to 60%. Wall   motion was normal; there were no regional wall motion   abnormalities. Indeterminate mean left atrial filling pressure. - Left atrium: The atrium was mildly to moderately dilated.  _____________   History of Present Illness     76 yom with hx CAD, diastolic dysfunction without HF, last stent 2006, and neg nuc 2016.  Pt has been on long term plavix but not ASA. He has been doing well until 12/11/17 when he developed sudden dizziness and diaphoresis - EMS was called and pt was in a fib.  RVR  He has had in the past.   In ER EKG was a fib with rate control.    HIs BNP was 414, troponin 0.04    TSH 1.834 CXR without acute process.  Na 139, K+ 4.2, BUN 20 Cr 1.39 Ca 8.5 - pt was placed on IV heparin and IV Dilt. He was admitted to ER.    Hospital Course       Consultants: none  Pt converted during the night to SR and is maintaining with freq PACs.  His troponins are neg.  His IV dilt was changed to po dilt at 180 mg daily and we are stopping amlodipine.  Eliquis was added and IV heparin stopped along with plavix.    His Echo was stable, with normal EF.   He was seen and evaluated by Dr. Meda Coffee and found stable for discharge.  He will follow up with DR Ellyn Hack or APP.     _____________  Discharge Vitals Blood pressure 128/78, pulse (!) 48, temperature 98 F (36.7 C), temperature source Oral, resp. rate 15, height 5\' 8"  (1.727 m), weight 95.7 kg, SpO2 98 %.  Filed Weights   12/11/17 1301 12/11/17 1839 12/12/17 0553  Weight: 94.3 kg 94.3 kg 95.7 kg    Labs & Radiologic Studies    CBC Recent Labs    12/11/17 1400 12/12/17 0737  WBC 10.8* 8.8  NEUTROABS 8.8*  --   HGB 14.1 13.1  HCT 43.2 40.3  MCV 90.2 88.6  PLT 189 297   Basic Metabolic Panel Recent Labs    12/11/17 1400 12/11/17 1905  NA 139 141  K 4.2 4.1  CL 104 105  CO2 26 26  GLUCOSE 138* 129*  BUN 20 20  CREATININE 1.39* 1.27*  CALCIUM 8.5* 8.7*  MG 2.0  --    Liver  Function Tests Recent Labs    12/11/17 1400  AST 22  ALT 17  ALKPHOS 46  BILITOT 0.5  PROT 5.9*  ALBUMIN 3.5   No results for input(s): LIPASE, AMYLASE in the last 72 hours. Cardiac Enzymes Recent Labs    12/11/17 1905 12/12/17 0218 12/12/17 0737  TROPONINI <0.03 <0.03 <0.03   BNP Invalid input(s): POCBNP D-Dimer No results for input(s): DDIMER in the last 72 hours. Hemoglobin A1C No results for input(s): HGBA1C in the last 72 hours. Fasting Lipid Panel No results for input(s): CHOL, HDL, LDLCALC, TRIG, CHOLHDL, LDLDIRECT in the last 72 hours. Thyroid Function Tests Recent Labs    12/11/17 1905  TSH 1.834   _____________  Dg Chest 2 View  Result Date: 12/11/2017 CLINICAL DATA:  Dizziness, diaphoresis, AFib EXAM: CHEST - 2 VIEW COMPARISON:  CT chest dated 06/27/2012 FINDINGS:  Lungs are clear.  No pleural effusion or pneumothorax. The heart is normal in size. Degenerative changes of the visualized thoracolumbar spine. IMPRESSION: No evidence of acute cardiopulmonary disease. Electronically Signed   By: Julian Hy M.D.   On: 12/11/2017 15:12   Disposition   Pt is being discharged home today in good condition.  Follow-up Plans & Appointments  Heart Healthy Diabetic Diet   Stop plavix and amlodipine we have you on eliquis and diltiazem.    Call if any further episodes or if severe go to ER.      Follow-up Information    Leonie Man, MD Follow up on 12/27/2017.   Specialty:  Cardiology Why:  at 0800 AM with his NO Dr. Jory Sims Contact information: [address] [place] [address] [phone]            Discharge Medications   Allergies as of 12/12/2017      Reactions   Betadine [povidone Iodine] Anaphylaxis   Contrast Media [iodinated Diagnostic Agents] Anaphylaxis, Rash   Other Reaction: Other reaction   Iodine Anaphylaxis   Shellfish, IVP dye. Swelling in throat and foaming at mouth.   Shellfish Allergy Anaphylaxis      Medication List    STOP taking these medications   amLODipine 10 MG tablet Commonly known as:  NORVASC   aspirin 81 MG chewable tablet   clopidogrel 75 MG tablet Commonly known as:  PLAVIX     TAKE these medications   acetaminophen 325 MG tablet Commonly known as:  TYLENOL Take 2 tablets (650 mg total) by mouth every 4 (four) hours as needed for headache or mild pain.   allopurinol 300 MG tablet Commonly known as:  ZYLOPRIM Take 1 tablet (300 mg total) by mouth daily.   apixaban 5 MG Tabs tablet Commonly known as:  ELIQUIS Take 1 tablet (5 mg total) by mouth 2 (two) times daily.   diltiazem 180 MG 24 hr capsule Commonly known as:  CARDIZEM CD Take 1 capsule (180 mg total) by mouth daily. Start taking on:  12/13/2017   escitalopram 20 MG tablet Commonly known as:   LEXAPRO Take 20 mg by mouth daily.   metFORMIN 500 MG tablet Commonly known as:  GLUCOPHAGE Take 1 tablet (500 mg total) by mouth 2 (two) times daily with a meal. What changed:  See the new instructions.   oxybutynin 15 MG 24 hr tablet Commonly known as:  DITROPAN XL Take 1 tablet (15 mg total) by mouth at bedtime.   rosuvastatin 20 MG tablet Commonly known as:  CRESTOR Take 1 tablet (20 mg total) by  mouth daily.   tamsulosin 0.4 MG Caps capsule Commonly known as:  FLOMAX Take 1 capsule (0.4 mg total) by mouth at bedtime.   valsartan 80 MG tablet Commonly known as:  DIOVAN Take 1 tablet (80 mg total) by mouth every evening.   VITAMIN D (CHOLECALCIFEROL) PO Take 1 tablet by mouth daily.        Acute coronary syndrome (MI, NSTEMI, STEMI, etc) this admission?: No.    Outstanding Labs/Studies   BMP, CBC  Duration of Discharge Encounter   Greater than 30 minutes including physician time.  Signed, Cecilie Kicks, NP 12/12/2017, 3:54 PM

## 2017-12-12 NOTE — Discharge Instructions (Addendum)
Heart Healthy Diabetic Diet   Stop plavix and amlodipine we have you on eliquis and diltiazem.    Call if any further episodes or if severe go to ER.        Information on my medicine - ELIQUIS (apixaban)  This medication education was reviewed with me or my healthcare representative as part of my discharge preparation.  The pharmacist that spoke with me during my hospital stay was:  Tad Moore, [facility]  Why was Eliquis prescribed for you? Eliquis was prescribed for you to reduce the risk of a blood clot forming that can cause a stroke if you have a medical condition called atrial fibrillation (a type of irregular heartbeat).  What do You need to know about Eliquis ? Take your Eliquis TWICE DAILY - one tablet in the morning and one tablet in the evening with or without food. If you have difficulty swallowing the tablet whole please discuss with your pharmacist how to take the medication safely.  Take Eliquis exactly as prescribed by your doctor and DO NOT stop taking Eliquis without talking to the doctor who prescribed the medication.  Stopping may increase your risk of developing a stroke.  Refill your prescription before you run out.  After discharge, you should have regular check-up appointments with your healthcare provider that is prescribing your Eliquis.  In the future your dose may need to be changed if your kidney function or weight changes by a significant amount or as you get older.  What do you do if you miss a dose? If you miss a dose, take it as soon as you remember on the same day and resume taking twice daily.  Do not take more than one dose of ELIQUIS at the same time to make up a missed dose.  Important Safety Information A possible side effect of Eliquis is bleeding. You should call your healthcare provider right away if you experience any of the following: ? Bleeding from an injury or your nose that does not stop. ? Unusual colored urine (red or dark  brown) or unusual colored stools (red or black). ? Unusual bruising for unknown reasons. ? A serious fall or if you hit your head (even if there is no bleeding).  Some medicines may interact with Eliquis and might increase your risk of bleeding or clotting while on Eliquis. To help avoid this, consult your healthcare provider or pharmacist prior to using any new prescription or non-prescription medications, including herbals, vitamins, non-steroidal anti-inflammatory drugs (NSAIDs) and supplements.  This website has more information on Eliquis (apixaban): http://www.eliquis.com/eliquis/home

## 2017-12-12 NOTE — Progress Notes (Signed)
Progress Note  Patient Name: [name] Date of Encounter: 12/12/2017  Primary Cardiologist: Dr. Ellyn Hack  Subjective   No chest pain or SOB.  Inpatient Medications    Scheduled Meds: . aspirin  81 mg Oral Daily  . escitalopram  20 mg Oral Daily  . oxybutynin  15 mg Oral QHS  . rosuvastatin  20 mg Oral Daily  . tamsulosin  0.4 mg Oral QHS   Continuous Infusions: . diltiazem (CARDIZEM) infusion Stopped (12/12/17 0155)  . heparin 1,350 Units/hr (12/12/17 1000)   PRN Meds: acetaminophen, ondansetron (ZOFRAN) IV   Vital Signs    Vitals:   12/11/17 2133 12/11/17 2313 12/12/17 0155 12/12/17 0553  BP: (!) 142/87 (!) 142/74  132/67  Pulse: 61 (!) 52  63  Resp:  17 15   Temp: 97.6 F (36.4 C) 97.7 F (36.5 C)  97.9 F (36.6 C)  TempSrc: Oral Oral  Oral  SpO2: 97% 100%  97%  Weight:    95.7 kg  Height:        Intake/Output Summary (Last 24 hours) at 12/12/2017 1053 Last data filed at 12/12/2017 1000 Gross per 24 hour  Intake 815.9 ml  Output 150 ml  Net 665.9 ml   Filed Weights   12/11/17 1301 12/11/17 1839 12/12/17 0553  Weight: 94.3 kg 94.3 kg 95.7 kg    Telemetry    SR with very frequent PACs - Personally Reviewed  ECG    A-fib 60 BPM - Personally Reviewed  Physical Exam   GEN: No acute distress.   Neck: No JVD Cardiac: iRRR, no murmurs, rubs, or gallops.  Respiratory: Clear to auscultation bilaterally. GI: Soft, nontender, non-distended  MS: No edema; No deformity. Neuro:  Nonfocal  Psych: Normal affect   Labs    Chemistry Recent Labs  Lab 12/11/17 1400 12/11/17 1905  NA 139 141  K 4.2 4.1  CL 104 105  CO2 26 26  GLUCOSE 138* 129*  BUN 20 20  CREATININE 1.39* 1.27*  CALCIUM 8.5* 8.7*  PROT 5.9*  --   ALBUMIN 3.5  --   AST 22  --   ALT 17  --   ALKPHOS 46  --   BILITOT 0.5  --   GFRNONAA 47* 52*  GFRAA 54* >60  ANIONGAP 9 10     Hematology Recent Labs  Lab 12/11/17 1400 12/12/17 0737  WBC 10.8* 8.8  RBC 4.79  4.55  HGB 14.1 13.1  HCT 43.2 40.3  MCV 90.2 88.6  MCH 29.4 28.8  MCHC 32.6 32.5  RDW 13.8 13.8  PLT 189 172    Cardiac Enzymes Recent Labs  Lab 12/11/17 1905 12/12/17 0218 12/12/17 0737  TROPONINI <0.03 <0.03 <0.03    Recent Labs  Lab 12/11/17 1421 12/11/17 1742  TROPIPOC 0.04 0.03     BNP Recent Labs  Lab 12/11/17 1400  BNP 414.6*     DDimer No results for input(s): DDIMER in the last 168 hours.   Radiology    Dg Chest 2 View  Result Date: 12/11/2017 CLINICAL DATA:  Dizziness, diaphoresis, AFib EXAM: CHEST - 2 VIEW COMPARISON:  CT chest dated 06/27/2012 FINDINGS: Lungs are clear.  No pleural effusion or pneumothorax. The heart is normal in size. Degenerative changes of the visualized thoracolumbar spine. IMPRESSION: No evidence of acute cardiopulmonary disease. Electronically Signed   By: Julian Hy M.D.   On: 12/11/2017 15:12    Cardiac Studies   Echo is pending  Patient Profile  82 y.o. male   Assessment & Plan    1.  New onset atrial fibrillation, currently rate controlled, cardioverted to SR overnight On iv Cardizem drip, I will start cardizem CD 180 mg op daily. D/C heparin drip, start Eliquis 5 mg po BID. We will discontinue Plavix. This patients CHA2DS2-VASc Score and unadjusted Ischemic Stroke Rate (% per year) is equal to 3.2 % stroke rate/year from a score of 3 Above score calculated as 1 point each if present [CHF, HTN, DM, Vascular=MI/PAD/Aortic Plaque, Age if 65-74, or Male] Above score calculated as 2 points each if present [Age > 75, or Stroke/TIA/TE] We will obtain an echocardiogram, if he does not cardiovert on his own we will plan for cardioversion after 4 weeks of full anticoagulation.  I will consider switching amlodipine to Cardizem on discharge. He is intolerant to beta-blockers.  2.  CAD, status post PCI with DES in 2006, he is asymptomatic.  We will continue cycle troponins.  3.  Hypertension -controlled, we will  switch amlodipine to Cardizem.  4.  Hyperlipidemia -on Crestor that is tolerated well.  We will discharge home after echocardiogram is done.   For questions or updates, please contact [place] Please consult [email or web address] for contact info under     Signed, Ena Dawley, MD  12/12/2017, 10:53 AM

## 2017-12-12 NOTE — Progress Notes (Signed)
  Echocardiogram 2D Echocardiogram has been performed.  Jennette Dubin 12/12/2017, 1:43 PM

## 2017-12-12 NOTE — Care Management (Signed)
12-12-17   BENEFITS CHECK :  #  4.  [facility]  @ [place] RX # [phone]  1. ELIQUIS   2.5 MG BID   AND ELIQUIS  5 MG BID COVER- YES CO-PAY- $ 47.00   FOR EACH RX TIER- 3 DRUG PRIOR APPROVAL- NO  2. XARELTO  20 MG DAILY COVER- YES CO-PAY- $ 47.00 TIER- 3 DRUG PRIOR APPROVAL- NO  NO DEDUCTIBLE  PREFERRED PHARMACY : YES   [facility] 90 DAY SUPPLY FOR M/O  $ 141.00

## 2017-12-13 DIAGNOSIS — Z23 Encounter for immunization: Secondary | ICD-10-CM | POA: Diagnosis not present

## 2017-12-13 NOTE — Consult Note (Signed)
            [facility] CM Primary Care Navigator  12/13/2017  [name] 1935-08-17 [id number]   Went to seepatient at the bedside to identify possible discharge needs buthe wasalready discharged homeper staff.  Per MD note, patient was admitted after he developed sudden dizziness and diaphoresis and was found to have atrial fibrillation with rapid ventricular response, converted with IV Diltiazem to sinus rhythm.  Patient has discharge instruction to follow-up withcardiology on 12/27/17.   Primary care provider's office is listed as providing transition of care (TOC) follow-up.   For additional questions please contact:  Edwena Felty A. Kawana Hegel, BSN, RN-BC [facility] PRIMARY CARE Navigator Cell: [phone]

## 2017-12-26 NOTE — Progress Notes (Signed)
Cardiology Office Note   Date:  12/27/2017   ID:  [name], DOB [birth year], MRN [id number]  PCP:  Lavone Orn, MD  Cardiologist: Dr. Ellyn Hack  Chief Complaint  Patient presents with  . Atrial Fibrillation  . Coronary Artery Disease   History of Present Illness: [name] is a 82 y.o. male who presents for post hospitalization follow up after admission for atrial fib with RVR. He has a known history of CAD, diastolic dysfunction. He had sudden onset of dizziness and diaphoresis. On arrival to ER, he was placed on IV heparin and diltiazem. He converted to NSR on diltiazem and transitioned to po diltiazem at 180 mg daily. Amlodipine was discontinued. He was started on Eliquis.and Plavix was discontinued.   He comes today without complaints. He is tolerating Eliquis without bleeding. No recurrent dyspnea or palpitations. He is medically compliant.   Past Medical History:  Diagnosis Date  . Anticoagulation adequate, Eliquis with [name] 12/12/2017  . Arthritis   . Atrial fibrillation with RVR ([place]) 12/11/2017  . CAD S/P percutaneous coronary angioplasty 08/2004   CARDIOLOGIST-  DR DAVID HARDING; DES to LCx. X2  - Cypher 2.5 mm postdilated to 2.75 mm (20 mm and 13 mm stents);; Myoview 09/2014: LOW RISK. No Infarct or Ischemia.  . Colon polyps 05/10/2005   Hyperplastic polyps  . Complication of anesthesia    "takes long time to wake up"  . Depression   . Diabetes mellitus type 2 with complications (HCC)    neuropathy; CAD borderline- no med  . Diastolic dysfunction without heart failure    Moderate LVH, Gr 1 DD on Echo 2011; no CHF admissions, no tt on diuretisc  . Essential hypertension   . Frequency of urination   . Gout   . H/O: gout STABLE  . Heart murmur    hx - no notable valvular lesion on Echo  . History of melanoma excision 2010   FOREHEAD  . History of prostate cancer 2004--  S/P SEED IMPLANTS    NO RECURRENCE  . Hydrocele, left   . Hyperlipidemia  LDL goal <70   . Impaired hearing RIGHT HEARING AID  . OSA on CPAP    bipap  . Prostate cancer ([place])    prostate , melanoma head  . Shingles 07  . Syncope and collapse March 2017   Likely related to post micturition, Neurontin and dehydration with orthostatic hypotension  . Urgency of urination     Past Surgical History:  Procedure Laterality Date  . Cardiac Event Monitor  3-07/2015   Mostly SR - 58-132 bpm. Rare PVCs (occ bigeminy), Frequent PACs - singlets, couplets - short runs of PAT.  . circumsision    . CORONARY ANGIOPLASTY WITH STENT PLACEMENT  09-01-2004     Cypher DES x 2 d-m LCx 2.5 mm x 20 mm & 2.5 mm x 13 mm (~2.75 mm)  . EXCISION MELANOMA FROM FOREHEAD  2010  . EYE SURGERY Bilateral    cataracts  . HYDROCELE EXCISION  10/14/2011   Procedure: HYDROCELECTOMY ADULT;  Surgeon: Ailene Rud, MD;  Location: [facility];  Service: Urology;  Laterality: Left;  . LAMINECTOMY N/A 07/13/2012   Procedure: THORACIC LAMINECTOMY FOR RESECTION OF INTRAMEDULLARY SPINAL CHORD TUMOR WITH SPINAL CHORD MONITORING;  Surgeon: Erline Levine, MD;  Location: [place] [facility];  Service: Neurosurgery;  Laterality: N/A;  Thoracic laminectomy for resection of intramedullary spinal cord tumor with spinal cord monitering and Dr. Christella Noa to assist  .  LAMINECTOMY N/A 12/09/2014   Procedure: Redo Thoracic laminectomy with intramedullary tumar resection/USN/[place]/Subarachnoid shunt/spinal cord monitoring/Dr. Kathyrn Sheriff to assist;  Surgeon: Erline Levine, MD;  Location: [place] [facility];  Service: Neurosurgery;  Laterality: N/A;  Redo Thoracic laminectomy with intramedullary tumar resection/USN/[place]/Subarachnoid shunt/spinal cord monitoring/Dr. Kathyrn Sheriff to assist  . NM MYOVIEW LTD  3/212014; June 2016   Both Lexiscan: a. LOW RISK, mild inferior bowel artifact; No ischemia or Infarction; b. Low risk, normal study. No infarction or ischemia. No artifact.  Marland Kitchen PROSTATE PALLADIUM GOLD SEED IMPLANTS (118)   11-15-2002   PROSTATE CANCER  . SKIN BIOPSY    . TONSILLECTOMY    . TRANSTHORACIC ECHOCARDIOGRAM  MAR 07,2011   NORMAL EF > 55% / NORMAL FUNCTION WITH IMPAIRED RELAXATION AND MODERATE CONCENTRIC LVH LIKELY HYPERTENSIVE DISEASE     Current Outpatient Medications  Medication Sig Dispense Refill  . acetaminophen (TYLENOL) 325 MG tablet Take 2 tablets (650 mg total) by mouth every 4 (four) hours as needed for headache or mild pain.    Marland Kitchen allopurinol (ZYLOPRIM) 300 MG tablet Take 1 tablet (300 mg total) by mouth daily. 30 tablet 1  . apixaban (ELIQUIS) 5 MG TABS tablet Take 1 tablet (5 mg total) by mouth 2 (two) times daily. 60 tablet 0  . diltiazem (CARDIZEM CD) 180 MG 24 hr capsule Take 1 capsule (180 mg total) by mouth daily. 30 capsule 6  . escitalopram (LEXAPRO) 20 MG tablet Take 20 mg by mouth daily.    . metFORMIN (GLUCOPHAGE) 500 MG tablet Take 1 tablet (500 mg total) by mouth 2 (two) times daily with a meal.    . oxybutynin (DITROPAN XL) 15 MG 24 hr tablet Take 1 tablet (15 mg total) by mouth at bedtime. 30 tablet 1  . rosuvastatin (CRESTOR) 20 MG tablet Take 1 tablet (20 mg total) by mouth daily. 30 tablet 1  . tamsulosin (FLOMAX) 0.4 MG CAPS capsule Take 1 capsule (0.4 mg total) by mouth at bedtime. 30 capsule 1  . telmisartan (MICARDIS) 40 MG tablet Take 40 mg by mouth daily.    Marland Kitchen VITAMIN D, CHOLECALCIFEROL, PO Take 1 tablet by mouth daily.      No current facility-administered medications for this visit.     Allergies:   Betadine [povidone iodine]; Contrast media [iodinated diagnostic agents]; Iodine; and Shellfish allergy    Social History:  The patient  reports that he has never smoked. He has never used smokeless tobacco. He reports that he drinks alcohol. He reports that he does not use drugs.   Family History:  The patient's family history includes Hypertension in his brother, father, and mother; Prostate cancer in his brother and father; Stroke in his mother.    ROS:  All other systems are reviewed and negative. Unless otherwise mentioned in H&P    PHYSICAL EXAM: VS:  BP 134/80   Pulse (!) 59   Ht 5' 9.5" (1.765 m)   Wt 215 lb 12.8 oz (97.9 kg)   BMI 31.41 kg/m  , BMI Body mass index is 31.41 kg/m. GEN: Well nourished, well developed, in no acute distress  HEENT: normal  Neck: no JVD, carotid bruits, or masses Cardiac: RRR; no murmurs, rubs, or gallops,no edema  Respiratory:  Cear to auscultation bilaterally, normal work of breathing GI: soft, nontender, nondistended, + BS MS: no deformity or atrophy  Skin: warm and dry, no rash Neuro:  Strength and sensation are intact Psych: euthymic mood, full affect   EKG NSR rat of 59  bpm with occasional PAC.   Recent Labs: 12/11/2017: ALT 17; B Natriuretic Peptide 414.6; BUN 20; Creatinine, Ser 1.27; Magnesium 2.0; Potassium 4.1; Sodium 141; TSH 1.834 20-Dec-2017: Hemoglobin 13.1; Platelets 172    Lipid Panel    Component Value Date/Time   CHOL 112 04/16/2013 0959   CHOL 145 10/02/2012 1002   TRIG 143 04/16/2013 0959   TRIG 157 (H) 10/02/2012 1002   HDL 31 (L) 04/16/2013 0959   HDL 36 (L) 10/02/2012 1002   CHOLHDL 3.6 04/16/2013 0959   VLDL 29 04/16/2013 0959   LDLCALC 52 04/16/2013 0959   LDLCALC 78 10/02/2012 1002      Wt Readings from Last 3 Encounters:  12/27/17 215 lb 12.8 oz (97.9 kg)  12-20-2017 211 lb (95.7 kg)  01/06/17 208 lb (94.3 kg)      Other studies Reviewed: Echocardiogram 12-20-17  Left ventricle: The cavity size was normal. There was moderate   concentric hypertrophy. Systolic function was normal. The   estimated ejection fraction was in the range of 55% to 60%. Wall   motion was normal; there were no regional wall motion   abnormalities. Indeterminate mean left atrial filling pressure. - Left atrium: The atrium was mildly to moderately dilated.  ASSESSMENT AND PLAN:  1. Atrial fib: He is now in NSR/SB. He is tolerating his Eliquis and diltiazem without recurrence  of rapid HR or with any dizziness. Continue current regimen.   2. CAD: S/P DES to Lx Cx X2. Low risk myoview 09/2014. Continue secondary prevention.   3. Hypertension: BP is currently well controlled.   4. Hypercholesterolemia: Continue statin therapy. Goal of LDL <70. Will need to repeat lipids on next office visit if not completed by PCP.    Current medicines are reviewed at length with the patient today.  Multiple questions are answered.   Labs/ tests ordered today include: None  Phill Myron. West Pugh, ANP, AACC   12/27/2017 8:32 AM    [facility] [address] [place], [place] [address] Phone: [phone]; Fax: [phone]

## 2017-12-27 ENCOUNTER — Ambulatory Visit (INDEPENDENT_AMBULATORY_CARE_PROVIDER_SITE_OTHER): Payer: Medicare Other | Admitting: Adult Health

## 2017-12-27 ENCOUNTER — Encounter: Payer: Self-pay | Admitting: Adult Health

## 2017-12-27 VITALS — BP 134/80 | HR 59 | Ht 69.5 in | Wt 215.8 lb

## 2017-12-27 DIAGNOSIS — I10 Essential (primary) hypertension: Secondary | ICD-10-CM | POA: Diagnosis not present

## 2017-12-27 DIAGNOSIS — I2510 Atherosclerotic heart disease of native coronary artery without angina pectoris: Secondary | ICD-10-CM | POA: Diagnosis not present

## 2017-12-27 DIAGNOSIS — I4891 Unspecified atrial fibrillation: Secondary | ICD-10-CM

## 2017-12-27 DIAGNOSIS — I480 Paroxysmal atrial fibrillation: Secondary | ICD-10-CM

## 2017-12-27 DIAGNOSIS — E7800 Pure hypercholesterolemia, unspecified: Secondary | ICD-10-CM | POA: Diagnosis not present

## 2017-12-27 MED ORDER — ROSUVASTATIN CALCIUM 20 MG PO TABS: 20.0000 mg | ORAL_TABLET | Freq: Every day | ORAL | 6 refills | Status: DC

## 2017-12-27 MED ORDER — DILTIAZEM HCL ER COATED BEADS 180 MG PO CP24: 180.0000 mg | ORAL_CAPSULE | Freq: Every day | ORAL | 6 refills | Status: DC

## 2017-12-27 MED ORDER — TELMISARTAN 40 MG PO TABS: 40.0000 mg | ORAL_TABLET | Freq: Every day | ORAL | 6 refills | Status: DC

## 2017-12-27 NOTE — Addendum Note (Signed)
Addended by: Waylan Rocher on: 12/27/2017 08:44 AM   Modules accepted: Orders

## 2017-12-27 NOTE — Patient Instructions (Signed)
Medication Instructions:  NO CHANGES- Your physician recommends that you continue on your current medications as directed. Please refer to the Current Medication list given to you today.  If you need a refill on your cardiac medications before your next appointment, please call your pharmacy.  Follow-Up: Your physician wants you to follow-up in: 3 MONTHS WITH DR [facility]  Thank you for choosing [facility]!!

## 2018-01-12 ENCOUNTER — Ambulatory Visit: Payer: Medicare Other | Admitting: Cardiology

## 2018-01-23 ENCOUNTER — Ambulatory Visit: Payer: Medicare Other | Admitting: Cardiology

## 2018-03-21 ENCOUNTER — Ambulatory Visit (INDEPENDENT_AMBULATORY_CARE_PROVIDER_SITE_OTHER): Payer: Medicare Other | Admitting: Cardiology

## 2018-03-21 ENCOUNTER — Encounter: Payer: Self-pay | Admitting: Cardiology

## 2018-03-21 VITALS — BP 138/96 | HR 58 | Ht 69.5 in | Wt 209.0 lb

## 2018-03-21 DIAGNOSIS — I2510 Atherosclerotic heart disease of native coronary artery without angina pectoris: Secondary | ICD-10-CM | POA: Diagnosis not present

## 2018-03-21 DIAGNOSIS — Z7901 Long term (current) use of anticoagulants: Secondary | ICD-10-CM

## 2018-03-21 DIAGNOSIS — I480 Paroxysmal atrial fibrillation: Secondary | ICD-10-CM

## 2018-03-21 DIAGNOSIS — Z9861 Coronary angioplasty status: Secondary | ICD-10-CM | POA: Diagnosis not present

## 2018-03-21 DIAGNOSIS — E785 Hyperlipidemia, unspecified: Secondary | ICD-10-CM | POA: Diagnosis not present

## 2018-03-21 DIAGNOSIS — R5383 Other fatigue: Secondary | ICD-10-CM

## 2018-03-21 DIAGNOSIS — I10 Essential (primary) hypertension: Secondary | ICD-10-CM | POA: Diagnosis not present

## 2018-03-21 LAB — CBC
Hematocrit: 42.7 % (ref 37.5–51.0)
Hemoglobin: 14.3 g/dL (ref 13.0–17.7)
MCH: 29.1 pg (ref 26.6–33.0)
MCHC: 33.5 g/dL (ref 31.5–35.7)
MCV: 87 fL (ref 79–97)
Platelets: 229 10*3/uL (ref 150–450)
RBC: 4.92 x10E6/uL (ref 4.14–5.80)
RDW: 14.6 % (ref 12.3–15.4)
WBC: 9.4 10*3/uL (ref 3.4–10.8)

## 2018-03-21 MED ORDER — DILTIAZEM HCL ER COATED BEADS 120 MG PO CP24: 120.0000 mg | ORAL_CAPSULE | Freq: Every day | ORAL | 6 refills | Status: DC

## 2018-03-21 NOTE — Patient Instructions (Addendum)
Medication Instructions:  STOP TAKING DILTIAZEM 180 MG   START TAKING DILTIAZEM 120 MG  If you need a refill on your cardiac medications before your next appointment, please call your pharmacy.   Lab work: CBC  If you have labs (blood work) drawn today and your tests are completely normal, you will receive your results only by: Marland Kitchen MyChart Message (if you have MyChart) OR . A paper copy in the mail If you have any lab test that is abnormal or we need to change your treatment, we will call you to review the results.  Testing/Procedures: NOT NEEDED  Follow-Up: At [facility], you and your health needs are our priority.  As part of our continuing mission to provide you with exceptional heart care, we have created designated Provider Care Teams.  These Care Teams include your primary Cardiologist (physician) and Advanced Practice Providers (APPs -  Physician Assistants and Nurse Practitioners) who all work together to provide you with the care you need, when you need it. You will need a follow up appointment in 2 months.  Please call our office 2 months in advance to schedule this appointment.  You may see Glenetta Hew, MD or one of the following Advanced Practice Providers on your designated Care Team:   Rosaria Ferries, PA-C . Jory Sims, DNP, ANP  Any Other Special Instructions Will Be Listed Below (If Applicable).

## 2018-03-21 NOTE — Progress Notes (Signed)
PCP: Lavone Orn, MD  Clinic Note: No chief complaint on file.   HPI: [name] is a 82 y.o. male with a PMH of CAD-PCI & relatively new Dx of Afib who presents today for ~3 month f/u.  He is a long-term patient of [name] who has h/o CAD-PCI back in 2006.  He is s/p re-do spinal Surgery for a non-malignant tumor I last saw him in Oct 2018, but he was last seen in September 2019 for Hospital f/u - New Dx Afib.Marland Kitchen  Recent Hospitalizations:  Admitted 12/11/2017 - Afibwith RVR. He had sudden onset of dizziness and diaphoresis. On arrival to ER, he was placed on IV heparin and diltiazem. He converted to NSR on diltiazem and transitioned to po diltiazem at 180 mg daily. Amlodipine was discontinued. He was started on Eliquis.and Plavix was discontinued [name] was actually just seen on September 25 by Lendon Colonel , NP for hospital follow-up after his A. fib admission.  Studies Personally Reviewed - (if available, images/films reviewed: From Epic Chart or Care Everywhere)  Transthoracic Echo September 2019 (A. fib): Moderate concentric LVH.  EF 55 to 60%.  No wall motion normality.  Mild to moderately dilated left atrium.  Interval History: [name] presents today not in his usual joyful cheerful state.  He tells me that he is just really not having a lot of energy levels.  He just says he falls asleep really easily.  Has not really had any much desire to do much out about.  Despite this, he is always traveling and this tells me about different trips that he is going on with his wife.  He is planning on going on a cruise over Christmas and New Year's timeframe. He has not been using his CPAP, in fact he threw it away/gave it away because he just felt like he could not tolerate it. He off-and-on has pain up under his left lower rib cage, but this is not associate with any particular activity. He described the initial symptoms that led to him going to the hospital with A. fib is  that he had just gotten back from [place] where his been at a casino doing some gambling.  He was counting some of his monitor the taken with him, and drops.  When he bent down to pick it up he just felt totally weak and dizzy felt like he was in a pass out he felt short of breath.  They went to the emergency room is found to be in A. fib.  He has not felt that since as far as any rapid irregular heartbeats or syncope/near syncope.  He does not have any chest tightness pressure with rest or exertion, but just feels fatigued.  He does not notice any real significant exertional dyspnea just short of breath when he is tired.  No PND, orthopnea but does have some mild end of day edema.  He is having a hard time trying to lose weight.  He has been coughing a lot lately and when he does cough hard he has some pain in his chest.  Not regular routine chest pain.  Thankfully, he has not had any evidence of bleeding or bruising or melena, hematochezia, hematuria or epistaxis while on Eliquis.  He has not had any further episodes of near syncope.  No TIA or amaurosis fugax. Although he has no claudication he does feel like he is lower legs get weak and almost like he has foot drop  on occasion when he is walking.  ROS: A comprehensive was performed. Review of Systems  Constitutional: Positive for malaise/fatigue (Very poor energy since discharge.).  Respiratory: Positive for cough. Negative for sputum production, shortness of breath and wheezing.   Gastrointestinal: Negative for blood in stool, diarrhea (Not diarrhea, just loose stools) and melena.       Has a hard time regulating his bowels.  He can wake up and have soiled himself and not known.  Genitourinary: Negative for hematuria.       Nocturia 2-3 x /night - has difficulty controlling urine/bowels.  Musculoskeletal: Positive for back pain.       Still having issues from his back surgery with radicular pains, no leg weakness as well as loss of bowel and  bladder control.  Neurological: Positive for tingling (Feet still numb) and focal weakness (Lower legs).       Falls asleep easily - if lies down - sleeping within 10 min.   Psychiatric/Behavioral: Negative for depression (Try to tell if he is really depressed, but he does seem to less cheerful). The patient has insomnia (More because of nocturia).   All other systems reviewed and are negative.   I have reviewed and (if needed) personally updated the patient's problem list, medications, allergies, past medical and surgical history, social and family history.   Past Medical History:  Diagnosis Date  . Anticoagulation adequate, Eliquis with [name] 12/12/2017  . Arthritis   . CAD S/P percutaneous coronary angioplasty 08/2004   CARDIOLOGIST-  DR Breelle Hollywood; DES to LCx. X2  - Cypher 2.5 mm postdilated to 2.75 mm (20 mm and 13 mm stents);; Myoview 09/2014: LOW RISK. No Infarct or Ischemia.  . Colon polyps 05/10/2005   Hyperplastic polyps  . Complication of anesthesia    "takes long time to wake up"  . Depression   . Diabetes mellitus type 2 with complications (HCC)    neuropathy; CAD borderline- no med  . Diastolic dysfunction without heart failure    Moderate LVH, Gr 1 DD on Echo 2011; no CHF admissions, no tt on diuretisc  . Essential hypertension   . Frequency of urination   . Gout   . H/O: gout STABLE  . Heart murmur    hx - no notable valvular lesion on Echo  . History of melanoma excision 2010   FOREHEAD  . History of prostate cancer 2004--  S/P SEED IMPLANTS    NO RECURRENCE  . Hydrocele, left   . Hyperlipidemia LDL goal <70   . Impaired hearing RIGHT HEARING AID  . OSA on CPAP    bipap  . PAF (paroxysmal atrial fibrillation) ([place]) 12/11/2017   Admitted with Afib RVR - converted on IV Diltiazem. d/c on Eliquis  . Prostate cancer ([place])    prostate , melanoma head  . Shingles 07  . Syncope and collapse March 2017   Likely related to post micturition, Neurontin  and dehydration with orthostatic hypotension  . Urgency of urination     Past Surgical History:  Procedure Laterality Date  . Cardiac Event Monitor  3-07/2015   Mostly SR - 58-132 bpm. Rare PVCs (occ bigeminy), Frequent PACs - singlets, couplets - short runs of PAT.  . circumsision    . CORONARY ANGIOPLASTY WITH STENT PLACEMENT  09-01-2004     Cypher DES x 2 d-m LCx 2.5 mm x 20 mm & 2.5 mm x 13 mm (~2.75 mm)  . EXCISION MELANOMA FROM FOREHEAD  2010  .  EYE SURGERY Bilateral    cataracts  . HYDROCELE EXCISION  10/14/2011   Procedure: HYDROCELECTOMY ADULT;  Surgeon: Ailene Rud, MD;  [facility];  Service: Urology;  Laterality: Left;  . LAMINECTOMY N/A 07/13/2012   Procedure: THORACIC LAMINECTOMY FOR RESECTION OF INTRAMEDULLARY SPINAL CHORD TUMOR WITH SPINAL CHORD MONITORING;  Surgeon: Erline Levine, MD;  Location: [facility];  Service: Neurosurgery;  Laterality: N/A;  Thoracic laminectomy for resection of intramedullary spinal cord tumor with spinal cord monitering and Dr. Christella Noa to assist  . LAMINECTOMY N/A 12/09/2014   Procedure: Redo Thoracic laminectomy with intramedullary tumar resection/USN/[place]/Subarachnoid shunt/spinal cord monitoring/Dr. Kathyrn Sheriff to assist;  Surgeon: Erline Levine, MD;  Location: [facility];  Service: Neurosurgery;  Laterality: N/A;  Redo Thoracic laminectomy with intramedullary tumar resection/USN/[place]/Subarachnoid shunt/spinal cord monitoring/Dr. Kathyrn Sheriff to assist  . NM MYOVIEW LTD  3/212014; June 2016   Both Lexiscan: a. LOW RISK, mild inferior bowel artifact; No ischemia or Infarction; b. Low risk, normal study. No infarction or ischemia. No artifact.  Marland Kitchen PROSTATE PALLADIUM GOLD SEED IMPLANTS (118)  11-15-2002   PROSTATE CANCER  . SKIN BIOPSY    . TONSILLECTOMY    . TRANSTHORACIC ECHOCARDIOGRAM  12/2017   (A. fib): Moderate concentric LVH.  EF 55 to 60%.  No wall motion normality.  Mild to moderately dilated left atrium.     Current Meds  Medication Sig  . allopurinol (ZYLOPRIM) 300 MG tablet Take 1 tablet (300 mg total) by mouth daily.  Marland Kitchen apixaban (ELIQUIS) 5 MG TABS tablet Take 1 tablet (5 mg total) by mouth 2 (two) times daily.  Marland Kitchen escitalopram (LEXAPRO) 20 MG tablet Take 20 mg by mouth daily.  . metFORMIN (GLUCOPHAGE) 500 MG tablet Take 1 tablet (500 mg total) by mouth 2 (two) times daily with a meal.  . oxybutynin (DITROPAN XL) 15 MG 24 hr tablet Take 1 tablet (15 mg total) by mouth at bedtime.  . rosuvastatin (CRESTOR) 20 MG tablet Take 1 tablet (20 mg total) by mouth daily.  . tamsulosin (FLOMAX) 0.4 MG CAPS capsule Take 1 capsule (0.4 mg total) by mouth at bedtime.  Marland Kitchen telmisartan (MICARDIS) 40 MG tablet Take 1 tablet (40 mg total) by mouth daily.  Marland Kitchen VITAMIN D, CHOLECALCIFEROL, PO Take 1 tablet by mouth daily.   . [DISCONTINUED] diltiazem (CARDIZEM CD) 180 MG 24 hr capsule Take 1 capsule (180 mg total) by mouth daily.    Allergies  Allergen Reactions  . Betadine [Povidone Iodine] Anaphylaxis  . Contrast Media [Iodinated Diagnostic Agents] Anaphylaxis and Rash    Other Reaction: Other reaction  . Iodine Anaphylaxis    Shellfish, IVP dye. Swelling in throat and foaming at mouth.  . Shellfish Allergy Anaphylaxis    Social History   Tobacco Use  . Smoking status: Never Smoker  . Smokeless tobacco: Never Used  . Tobacco comment: occ alcohol  Substance Use Topics  . Alcohol use: Yes    Comment: OCCASIONAL  . Drug use: No   Social History   Social History Narrative   He is married, father of 2, grandfather of 45. Does not really get exercise now because   of his back issues. Does not smoke and drinks social alcohol. Works in [employer].    family history includes Hypertension in his brother, father, and mother; Prostate cancer in his brother and father; Stroke in his mother.  Wt Readings from Last 3 Encounters:  03/21/18 209 lb (94.8 kg)  12/27/17 215 lb 12.8  oz (97.9 kg)  12/12/17  211 lb (95.7 kg)    PHYSICAL EXAM BP (!) 138/96   Pulse (!) 58   Ht 5' 9.5" (1.765 m)   Wt 209 lb (94.8 kg)   BMI 30.42 kg/m  Physical Exam  Constitutional: He is oriented to person, place, and time. He appears well-developed and well-nourished. No distress.  Healthy-appearing.  Well-groomed  HENT:  Head: Normocephalic and atraumatic.  Neck: No hepatojugular reflux and no JVD present. Carotid bruit is not present.  Cardiovascular: Regular rhythm and normal heart sounds.  Occasional extrasystoles are present. Bradycardia present. PMI is not displaced. Exam reveals no gallop, no friction rub and no decreased pulses.  No murmur heard. Pulmonary/Chest: Effort normal and breath sounds normal. No respiratory distress. He has no wheezes.  Abdominal: Soft. Bowel sounds are normal. He exhibits no distension. There is no abdominal tenderness. There is no rebound.  Musculoskeletal: Normal range of motion.        General: Edema (Trivial 1+ bilateral) present.  Neurological: He is alert and oriented to person, place, and time.  Walks with a cane.  Psychiatric: Judgment and thought content normal.  He feels to be in a much more somber mood than usual just a little bit dejected.  Despite this, he still tracked him up with a joke before the end the visit.  Vitals reviewed.   Adult ECG Report  Rate: 58;  Rhythm: sinus bradycardia and Nonspecific ST and T wave changes;   Narrative Interpretation: Stable EKG   Other studies Reviewed: Additional studies/ records that were reviewed today include:  Recent Labs: Last lipids were January 2019 should be due next month.  TC 144, TG 176, HDL 40, LDL 68.  BUN/CR 20/1.27.  TSH 1.834  Lab Results  Component Value Date   WBC 9.4 03/21/2018   HGB 14.3 03/21/2018   HCT 42.7 03/21/2018   MCV 87 03/21/2018   PLT 229 03/21/2018   Lab Results  Component Value Date   TSH 1.834 12/11/2017   Lab Results  Component Value Date   CREATININE 1.27 (H)  12/11/2017   BUN 20 12/11/2017   NA 141 12/11/2017   K 4.1 12/11/2017   CL 105 12/11/2017   CO2 26 12/11/2017    ASSESSMENT / PLAN: Problem List Items Addressed This Visit    Anticoagulation adequate, Eliquis with [name] (Chronic)   Relevant Orders   CBC (Completed)   CAD S/P percutaneous coronary angioplasty: Cypher DES x2 to circumflex (Chronic)    Distant history of PCI with DES stent.  Last Myoview was in 2016, negative.  No recurrent real anginal symptoms. No longer on Plavix as he is now on Eliquis. Is on diltiazem now because of A. fib along with his ARB. On stable dose of Crestor. Not on beta-blocker because of fatigue in the past.      Relevant Medications   diltiazem (CARDIZEM CD) 120 MG 24 hr capsule   Other Relevant Orders   EKG 12-Lead (Completed)   CBC (Completed)   Essential hypertension (Chronic)    Stable blood pressure now on diltiazem plus ARB.  Should be okay reducing diltiazem dose.      Relevant Medications   diltiazem (CARDIZEM CD) 120 MG 24 hr capsule   Fatigue due to treatment    Probably probably related to deconditioning but also cannot exclude effective calcium channel blocker since he was very fatigued with beta-blockers. Reduce dose to diltiazem 120 mg daily. Check CBC since he  is on Eliquis. He is due to have routine evaluation next month and will probably have a TSH checked.      Hyperlipidemia LDL goal <70 (Chronic)    Recently back on Crestor.  LDL as of last January was below 70.  Now Bullseye target is less than 50.  I do not think he is can tolerate more than 20 mg of Crestor.  Follow-up labs in January.      Relevant Medications   diltiazem (CARDIZEM CD) 120 MG 24 hr capsule   PAF (paroxysmal atrial fibrillation) (HCC) - Primary (Chronic)    New onset of A. fib recently admitted in September.  Now maintaining sinus rhythm.  I am little bit worried about his fatigue.  We will cut down his diltiazem dose to 120 mg a  day.  This patients CHA2DS2-VASc Score and unadjusted Ischemic Stroke Rate (% per year) is equal to 4.8 % stroke rate/year from a score of 4 --on Eliquis  Above score calculated as 1 point each if present [CHF, HTN, DM, Vascular=MI/PAD/Aortic Plaque, Age if 39-74, or Male] ;2 points each if present [Age > 75, or Stroke/TIA/TE]       Relevant Medications   diltiazem (CARDIZEM CD) 120 MG 24 hr capsule    Other Visit Diagnoses    Fatigue, unspecified type       Relevant Orders   CBC (Completed)      I spent a total of 40 minutes with the patient and chart review. >  50% of the time was spent in direct patient consultation.   Current medicines are reviewed at length with the patient today.  (+/- concerns) just feeling fatigued The following changes have been made:  See below  Patient Instructions  Medication Instructions:  STOP TAKING DILTIAZEM 180 MG   START TAKING DILTIAZEM 120 MG  If you need a refill on your cardiac medications before your next appointment, please call your pharmacy.   Lab work: CBC  If you have labs (blood work) drawn today and your tests are completely normal, you will receive your results only by: Marland Kitchen MyChart Message (if you have MyChart) OR . A paper copy in the mail If you have any lab test that is abnormal or we need to change your treatment, we will call you to review the results.  Testing/Procedures: NOT NEEDED  Follow-Up: At [facility], you and your health needs are our priority.  As part of our continuing mission to provide you with exceptional heart care, we have created designated Provider Care Teams.  These Care Teams include your primary Cardiologist (physician) and Advanced Practice Providers (APPs -  Physician Assistants and Nurse Practitioners) who all work together to provide you with the care you need, when you need it. You will need a follow up appointment in 2 months.  Please call our office 2 months in advance to schedule this  appointment.  You may see Glenetta Hew, MD or one of the following Advanced Practice Providers on your designated Care Team:   Rosaria Ferries, PA-C . Jory Sims, DNP, ANP  Any Other Special Instructions Will Be Listed Below (If Applicable).      Studies Ordered:   Orders Placed This Encounter  Procedures  . CBC  . EKG 12-Lead      Glenetta Hew, M.D., M.S. Interventional Cardiologist   Pager # [phone] Phone # [phone] [address] [place], [place] [address]   Thank you for choosing Heartcare at [facility]!!

## 2018-03-23 ENCOUNTER — Encounter: Payer: Self-pay | Admitting: Cardiology

## 2018-03-23 NOTE — Assessment & Plan Note (Signed)
Recently back on Crestor.  LDL as of last January was below 70.  Now Bullseye target is less than 50.  I do not think he is can tolerate more than 20 mg of Crestor.  Follow-up labs in January.

## 2018-03-23 NOTE — Assessment & Plan Note (Signed)
New onset of A. fib recently admitted in September.  Now maintaining sinus rhythm.  I am little bit worried about his fatigue.  We will cut down his diltiazem dose to 120 mg a day.  This patients CHA2DS2-VASc Score and unadjusted Ischemic Stroke Rate (% per year) is equal to 4.8 % stroke rate/year from a score of 4 --on Eliquis  Above score calculated as 1 point each if present [CHF, HTN, DM, Vascular=MI/PAD/Aortic Plaque, Age if 65-74, or Male] ;2 points each if present [Age > 75, or Stroke/TIA/TE]

## 2018-03-23 NOTE — Assessment & Plan Note (Signed)
Distant history of PCI with DES stent.  Last Myoview was in 2016, negative.  No recurrent real anginal symptoms. No longer on Plavix as he is now on Eliquis. Is on diltiazem now because of A. fib along with his ARB. On stable dose of Crestor. Not on beta-blocker because of fatigue in the past.

## 2018-03-23 NOTE — Assessment & Plan Note (Signed)
Probably probably related to deconditioning but also cannot exclude effective calcium channel blocker since he was very fatigued with beta-blockers. Reduce dose to diltiazem 120 mg daily. Check CBC since he is on Eliquis. He is due to have routine evaluation next month and will probably have a TSH checked.

## 2018-03-23 NOTE — Assessment & Plan Note (Signed)
Stable blood pressure now on diltiazem plus ARB.  Should be okay reducing diltiazem dose.

## 2018-04-28 ENCOUNTER — Encounter: Payer: Self-pay | Admitting: Cardiology

## 2018-05-01 DIAGNOSIS — Z1389 Encounter for screening for other disorder: Secondary | ICD-10-CM | POA: Diagnosis not present

## 2018-05-01 DIAGNOSIS — I480 Paroxysmal atrial fibrillation: Secondary | ICD-10-CM | POA: Diagnosis not present

## 2018-05-01 DIAGNOSIS — E782 Mixed hyperlipidemia: Secondary | ICD-10-CM | POA: Diagnosis not present

## 2018-05-01 DIAGNOSIS — Z8546 Personal history of malignant neoplasm of prostate: Secondary | ICD-10-CM | POA: Diagnosis not present

## 2018-05-01 DIAGNOSIS — M109 Gout, unspecified: Secondary | ICD-10-CM | POA: Diagnosis not present

## 2018-05-01 DIAGNOSIS — R159 Full incontinence of feces: Secondary | ICD-10-CM | POA: Diagnosis not present

## 2018-05-01 DIAGNOSIS — F325 Major depressive disorder, single episode, in full remission: Secondary | ICD-10-CM | POA: Diagnosis not present

## 2018-05-01 DIAGNOSIS — R152 Fecal urgency: Secondary | ICD-10-CM | POA: Diagnosis not present

## 2018-05-01 DIAGNOSIS — J019 Acute sinusitis, unspecified: Secondary | ICD-10-CM | POA: Diagnosis not present

## 2018-05-01 DIAGNOSIS — N3281 Overactive bladder: Secondary | ICD-10-CM | POA: Diagnosis not present

## 2018-05-01 DIAGNOSIS — I10 Essential (primary) hypertension: Secondary | ICD-10-CM | POA: Diagnosis not present

## 2018-05-01 DIAGNOSIS — Z0000 Encounter for general adult medical examination without abnormal findings: Secondary | ICD-10-CM | POA: Diagnosis not present

## 2018-05-01 DIAGNOSIS — E1122 Type 2 diabetes mellitus with diabetic chronic kidney disease: Secondary | ICD-10-CM | POA: Diagnosis not present

## 2018-05-01 DIAGNOSIS — N182 Chronic kidney disease, stage 2 (mild): Secondary | ICD-10-CM | POA: Diagnosis not present

## 2018-05-10 ENCOUNTER — Ambulatory Visit: Payer: Medicare Other | Admitting: Cardiology

## 2018-05-28 ENCOUNTER — Encounter: Payer: Self-pay | Admitting: Cardiology

## 2018-05-28 ENCOUNTER — Ambulatory Visit (INDEPENDENT_AMBULATORY_CARE_PROVIDER_SITE_OTHER): Payer: Medicare Other | Admitting: Cardiology

## 2018-05-28 VITALS — BP 148/78 | HR 70 | Ht 69.5 in | Wt 202.0 lb

## 2018-05-28 DIAGNOSIS — I10 Essential (primary) hypertension: Secondary | ICD-10-CM

## 2018-05-28 DIAGNOSIS — I491 Atrial premature depolarization: Secondary | ICD-10-CM | POA: Diagnosis not present

## 2018-05-28 DIAGNOSIS — E785 Hyperlipidemia, unspecified: Secondary | ICD-10-CM | POA: Diagnosis not present

## 2018-05-28 DIAGNOSIS — Z9861 Coronary angioplasty status: Secondary | ICD-10-CM

## 2018-05-28 DIAGNOSIS — I5189 Other ill-defined heart diseases: Secondary | ICD-10-CM | POA: Diagnosis not present

## 2018-05-28 DIAGNOSIS — I2510 Atherosclerotic heart disease of native coronary artery without angina pectoris: Secondary | ICD-10-CM | POA: Diagnosis not present

## 2018-05-28 DIAGNOSIS — I480 Paroxysmal atrial fibrillation: Secondary | ICD-10-CM | POA: Diagnosis not present

## 2018-05-28 NOTE — Patient Instructions (Signed)
Medication Instructions:  NOT NEEDED If you need a refill on your cardiac medications before your next appointment, please call your pharmacy.   Lab work: NOT  NEEDED If you have labs (blood work) drawn today and your tests are completely normal, you will receive your results only by: Marland Kitchen MyChart Message (if you have MyChart) OR . A paper copy in the mail If you have any lab test that is abnormal or we need to change your treatment, we will call you to review the results.  Testing/Procedures: NOT NEEDED  Follow-Up: At [facility], you and your health needs are our priority.  As part of our continuing mission to provide you with exceptional heart care, we have created designated Provider Care Teams.  These Care Teams include your primary Cardiologist (physician) and Advanced Practice Providers (APPs -  Physician Assistants and Nurse Practitioners) who all work together to provide you with the care you need, when you need it. You will need a follow up appointment in 6 months.  Please call our office 2 months in advance to schedule this appointment.  You may see Glenetta Hew, MD or one of the following Advanced Practice Providers on your designated Care Team:   Rosaria Ferries, PA-C . Jory Sims, DNP, ANP  Any Other Special Instructions Will Be Listed Below (If Applicable). NOT NEEDED

## 2018-05-28 NOTE — Progress Notes (Signed)
PCP: Lavone Orn, MD  Clinic Note: Chief Complaint  Patient presents with  . Follow-up    Overall feeling better.  Energy better.    HPI: [name] is a 83 y.o. male with a PMH of CAD-PCI & relatively new Dx of Afib who presents [name] for 2 month f/u after initiating Rx for Afib.  He is a long-term patient of [name] who has h/o CAD-PCI back in 2006.  He is s/p re-do spinal Surgery for a non-malignant tumor I last saw him in Dec 2019 -- 2nd post-hospital f/u.  September 2019 for Hospital f/u - New Dx Afib.Marland Kitchen  Recent Hospitalizations:   no recent episodes since 12/11/2017 (AFIB) [name] was actually just seen on September 25 by Lendon Colonel , NP for hospital follow-up after his A. fib admission.  Studies Personally Reviewed - (if available, images/films reviewed: From Epic Chart or Care Everywhere)  Transthoracic Echo September 2019 (A. fib): Moderate concentric LVH.  EF 55 to 60%.  No wall motion normality.  Mild to moderately dilated left atrium.  Interval History: [name] not in his usual jovial state of mind.  He seems to be doing quite well.  He is just frustrated by the fact that his feet seem to be doing worse.  He has less control of his feet.  He is less concerned about the fact that he is bowel and bladder control issues as well. He was recently at his sister-in-law's funeral down in [place] and then his wife fell and hurt her wrist.  She is currently at the orthopedic surgery office next-door.  He has been little bit stressed [name] methods probably why his blood pressure is little bit.  Usually at home it is much better than this.  He also has not taken his meds yet. He has not felt any recurrence of A. fib since his brief ordeal with a prior to last visit.  He is now on Eliquis and is doing well without any bleeding issues.  He seems to doing well with the diltiazem, denies any fatigue or dyspnea. He is not having any angina symptoms of chest  tightness pressure with rest or exertion.  He denies any heart failure symptoms of PND, orthopnea or edema.  No rapid regular heartbeats palpitations to suggest recurrence of A. fib. No syncope/near syncope or TIA/amaurosis fugax.  Not interested in trying CPAP again  ROS: A comprehensive was performed. Review of Systems  Constitutional: Negative for malaise/fatigue (Gradually we will build his energy level.  He is now starting to do his routine activities.).  HENT: Positive for sore throat.   Respiratory: Positive for cough. Negative for sputum production, shortness of breath and wheezing.   Gastrointestinal: Negative for blood in stool, diarrhea (Not diarrhea, just loose stools) and melena.       Has a hard time regulating his bowels.  He can wake up and have soiled himself and not known.  Genitourinary: Negative for hematuria.       Nocturia 2-3 x /night - has difficulty controlling urine/bowels.  Musculoskeletal: Positive for back pain. Negative for falls and joint pain.       Still having issues from his back surgery with radicular pains, no leg weakness as well as loss of bowel and bladder control.  Neurological: Positive for tingling (Feet still numb) and focal weakness (Lower legs).       Falls asleep easily - if lies down - sleeping within 10 min.  Psychiatric/Behavioral: Negative for depression. The patient has insomnia (More because of nocturia).   All other systems reviewed and are negative.   I have reviewed and (if needed) personally updated the patient's problem list, medications, allergies, past medical and surgical history, social and family history.   Past Medical History:  Diagnosis Date  . Anticoagulation adequate, Eliquis with [name] 12/12/2017  . Arthritis   . CAD S/P percutaneous coronary angioplasty 08/2004   CARDIOLOGIST-  DR Delayza Lungren; DES to LCx. X2  - Cypher 2.5 mm postdilated to 2.75 mm (20 mm and 13 mm stents);; Myoview 09/2014: LOW RISK. No  Infarct or Ischemia.  . Colon polyps 05/10/2005   Hyperplastic polyps  . Complication of anesthesia    "takes long time to wake up"  . Depression   . Diabetes mellitus type 2 with complications (HCC)    neuropathy; CAD borderline- no med  . Diastolic dysfunction without heart failure    Moderate LVH, Gr 1 DD on Echo 2011; no CHF admissions, no tt on diuretisc  . Essential hypertension   . H/O: gout STABLE  . History of melanoma excision 2010   FOREHEAD  . History of prostate cancer 2004--  S/P SEED IMPLANTS    NO RECURRENCE  . Hydrocele, left   . Hyperlipidemia LDL goal <70   . Impaired hearing RIGHT HEARING AID  . OSA on CPAP    bipap  . PAF (paroxysmal atrial fibrillation) ([facility]) 12/11/2017   Admitted with Afib RVR - converted on IV Diltiazem. d/c on Eliquis  . Prostate cancer ([facility])    prostate , melanoma head  . Shingles 07  . Syncope and collapse March 2017   Likely related to post micturition, Neurontin and dehydration with orthostatic hypotension  . Urgency of urination     Past Surgical History:  Procedure Laterality Date  . Cardiac Event Monitor  3-07/2015   Mostly SR - 58-132 bpm. Rare PVCs (occ bigeminy), Frequent PACs - singlets, couplets - short runs of PAT.  . circumsision    . CORONARY ANGIOPLASTY WITH STENT PLACEMENT  09-01-2004     Cypher DES x 2 d-m LCx 2.5 mm x 20 mm & 2.5 mm x 13 mm (~2.75 mm)  . EXCISION MELANOMA FROM FOREHEAD  2010  . EYE SURGERY Bilateral    cataracts  . HYDROCELE EXCISION  10/14/2011   Procedure: HYDROCELECTOMY ADULT;  Surgeon: Ailene Rud, MD;  Location: [facility];  Service: Urology;  Laterality: Left;  . LAMINECTOMY N/A 07/13/2012   Procedure: THORACIC LAMINECTOMY FOR RESECTION OF INTRAMEDULLARY SPINAL CHORD TUMOR WITH SPINAL CHORD MONITORING;  Surgeon: Erline Levine, MD;  Location: [facility];  Service: Neurosurgery;  Laterality: N/A;  Thoracic laminectomy for resection of intramedullary spinal cord tumor  with spinal cord monitering and Dr. Christella Noa to assist  . LAMINECTOMY N/A 12/09/2014   Procedure: Redo Thoracic laminectomy with intramedullary tumar resection/USN/[place]/Subarachnoid shunt/spinal cord monitoring/Dr. Kathyrn Sheriff to assist;  Surgeon: Erline Levine, MD;  Location: [place] [facility];  Service: Neurosurgery;  Laterality: N/A;  Redo Thoracic laminectomy with intramedullary tumar resection/USN/[place]/Subarachnoid shunt/spinal cord monitoring/Dr. Kathyrn Sheriff to assist  . NM MYOVIEW LTD  3/212014; June 2016   Both Lexiscan: a. LOW RISK, mild inferior bowel artifact; No ischemia or Infarction; b. Low risk, normal study. No infarction or ischemia. No artifact.  Marland Kitchen PROSTATE PALLADIUM GOLD SEED IMPLANTS (118)  11-15-2002   PROSTATE CANCER  . SKIN BIOPSY    . TONSILLECTOMY    . TRANSTHORACIC  ECHOCARDIOGRAM  12/2017   (A. fib): Moderate concentric LVH.  EF 55 to 60%.  No wall motion normality.  Mild to moderately dilated left atrium.    Current Meds  Medication Sig  . allopurinol (ZYLOPRIM) 300 MG tablet Take 1 tablet (300 mg total) by mouth daily.  Marland Kitchen amLODipine (NORVASC) 10 MG tablet Take 10 mg by mouth daily.  Marland Kitchen apixaban (ELIQUIS) 5 MG TABS tablet Take 1 tablet (5 mg total) by mouth 2 (two) times daily.  Marland Kitchen diltiazem (CARDIZEM CD) 120 MG 24 hr capsule Take 1 capsule (120 mg total) by mouth daily.  Marland Kitchen escitalopram (LEXAPRO) 10 MG tablet Take 1 tablet by mouth daily.  . metFORMIN (GLUCOPHAGE) 500 MG tablet Take 1 tablet (500 mg total) by mouth 2 (two) times daily with a meal.  . oxybutynin (DITROPAN XL) 15 MG 24 hr tablet Take 1 tablet (15 mg total) by mouth at bedtime.  . rosuvastatin (CRESTOR) 20 MG tablet Take 1 tablet (20 mg total) by mouth daily.  . tamsulosin (FLOMAX) 0.4 MG CAPS capsule Take 1 capsule (0.4 mg total) by mouth at bedtime.  Marland Kitchen telmisartan (MICARDIS) 40 MG tablet Take 1 tablet (40 mg total) by mouth daily.  Marland Kitchen VITAMIN D, CHOLECALCIFEROL, PO Take 1 tablet by mouth daily.     Allergies    Allergen Reactions  . Betadine [Povidone Iodine] Anaphylaxis  . Contrast Media [Iodinated Diagnostic Agents] Anaphylaxis and Rash    Other Reaction: Other reaction  . Iodine Anaphylaxis    Shellfish, IVP dye. Swelling in throat and foaming at mouth.  . Shellfish Allergy Anaphylaxis    Social History   Tobacco Use  . Smoking status: Never Smoker  . Smokeless tobacco: Never Used  . Tobacco comment: occ alcohol  Substance Use Topics  . Alcohol use: Yes    Comment: OCCASIONAL  . Drug use: No   Social History   Social History Narrative   He is married, father of 2, grandfather of 30. Does not really get exercise now because   of his back issues. Does not smoke and drinks social alcohol. Works in [employer].    family history includes Hypertension in his brother, father, and mother; Prostate cancer in his brother and father; Stroke in his mother.  Wt Readings from Last 3 Encounters:  05/28/18 202 lb (91.6 kg)  03/21/18 209 lb (94.8 kg)  12/27/17 215 lb 12.8 oz (97.9 kg)    PHYSICAL EXAM BP (!) 148/78   Pulse 70   Ht 5' 9.5" (1.765 m)   Wt 202 lb (91.6 kg)   BMI 29.40 kg/m  Physical Exam  Constitutional: He is oriented to person, place, and time. He appears well-developed and well-nourished. No distress.  Healthy-appearing.  Well-groomed  HENT:  Head: Normocephalic and atraumatic.  Neck: Normal range of motion. Neck supple. No hepatojugular reflux and no JVD present. Carotid bruit is not present.  Cardiovascular: Normal rate, regular rhythm and normal heart sounds.  Occasional extrasystoles are present. PMI is not displaced. Exam reveals no gallop, no friction rub and no decreased pulses.  No murmur heard. Pulmonary/Chest: Effort normal and breath sounds normal. No respiratory distress. He has no wheezes.  Musculoskeletal: Normal range of motion.        General: No edema (Trivial 1+ bilateral).  Neurological: He is alert and oriented to person, place, and time.   Walks with a cane.  Psychiatric: He has a normal mood and affect. His behavior is normal. Judgment and thought content  normal.  Back to his full normal mood and affect.  Vitals reviewed.   Adult ECG Report Not checked  Other studies Reviewed: Additional studies/ records that were reviewed [name] include:  Recent Labs: Last lipids were January 2019 should be due next month.  TC 144, TG 176, HDL 40, LDL 68.  BUN/CR 20/1.27.  TSH 1.834  Lab Results  Component Value Date   WBC 9.4 03/21/2018   HGB 14.3 03/21/2018   HCT 42.7 03/21/2018   MCV 87 03/21/2018   PLT 229 03/21/2018   Lab Results  Component Value Date   TSH 1.834 12/11/2017   Lab Results  Component Value Date   CREATININE 1.27 (H) 12/11/2017   BUN 20 12/11/2017   NA 141 12/11/2017   K 4.1 12/11/2017   CL 105 12/11/2017   CO2 26 12/11/2017   Lipids from January 2020: TC 130, HDL 37, LDL 60, TG 140.  A1c 6.5.  Hemoglobin 14.3.  Creatinine is 1.38.  Potassium 4.2.  TSH 1.83.   ASSESSMENT / PLAN: Problem List Items Addressed This Visit    CAD S/P percutaneous coronary angioplasty: Cypher DES x2 to circumflex - Primary (Chronic)    Distant DES PCI.  No recurrent angina.  Last Myoview in 2016 was nonischemic.  No recurrent angina symptoms. Continue double calcium channel blockers with amlodipine and diltiazem (not on beta-blocker because of fatigue). Also on ARB and statin. On rosuvastatin 20 mg daily. Not on aspirin because of Eliquis      Relevant Medications   amLODipine (NORVASC) 10 MG tablet   Diastolic dysfunction without heart failure (Chronic)    Normal EF with diastolic dysfunction on echo.  Currently euvolemic.  Probably is in the main reason why he does not do well with A. fib. Currently not on a diuretic.  Not to tolerate beta-blocker so is on diltiazem along with amlodipine and ARB.      Essential hypertension (Chronic)    Blood pressure is usually better than it is [name].  For now we will leave  current meds alone to avoid any orthostatic symptoms, but may need to further titrate diltiazem as the only current option for uptitrate      Relevant Medications   amLODipine (NORVASC) 10 MG tablet   Finding of multiple premature atrial contractions by electrocardiography (Chronic)    Noted on his exam is asked to be, relatively asymptomatic.  He does have a history of A. fib and this could just simply be the substrate.  Did not tolerate beta-blocker, and with using diltiazem.      Relevant Medications   amLODipine (NORVASC) 10 MG tablet   Hyperlipidemia LDL goal <70 (Chronic)    Seems to be tolerating the 20 mg of rosuvastatin with borderline target zone LDL.  Will be due for follow-up in roughly 6 months may be.  For now continue 20 mg Crestor, however if the LDL goes up probably need to adjust.      Relevant Medications   amLODipine (NORVASC) 10 MG tablet   PAF (paroxysmal atrial fibrillation) (HCC) (Chronic)    Seemingly asymptomatic without any further recurrences.  Is now on Eliquis for anticoagulation and diltiazem for rate control.      Relevant Medications   amLODipine (NORVASC) 10 MG tablet      I spent a total of 40 minutes with the patient and chart review. >  50% of the time was spent in direct patient consultation.   Current medicines are reviewed at length with  the patient [name].  (+/- concerns) just feeling fatigued The following changes have been made:  See below  Patient Instructions  Medication Instructions:  NOT NEEDED If you need a refill on your cardiac medications before your next appointment, please call your pharmacy.   Lab work: NOT  NEEDED If you have labs (blood work) drawn [name] and your tests are completely normal, you will receive your results only by: Marland Kitchen MyChart Message (if you have MyChart) OR . A paper copy in the mail If you have any lab test that is abnormal or we need to change your treatment, we will call you to review the  results.  Testing/Procedures: NOT NEEDED  Follow-Up: At [facility], you and your health needs are our priority.  As part of our continuing mission to provide you with exceptional heart care, we have created designated Provider Care Teams.  These Care Teams include your primary Cardiologist (physician) and Advanced Practice Providers (APPs -  Physician Assistants and Nurse Practitioners) who all work together to provide you with the care you need, when you need it. You will need a follow up appointment in 6 months.  Please call our office 2 months in advance to schedule this appointment.  You may see Glenetta Hew, MD or one of the following Advanced Practice Providers on your designated Care Team:   Rosaria Ferries, PA-C . Jory Sims, DNP, ANP  Any Other Special Instructions Will Be Listed Below (If Applicable). NOT NEEDED    Studies Ordered:   No orders of the defined types were placed in this encounter.     Glenetta Hew, M.D., M.S. Interventional Cardiologist   Pager # [phone] Phone # [phone] [address] [place], [place] [address]   Thank you for choosing [facility] at [facility]!!

## 2018-05-30 ENCOUNTER — Encounter: Payer: Self-pay | Admitting: Cardiology

## 2018-05-30 NOTE — Assessment & Plan Note (Signed)
Seemingly asymptomatic without any further recurrences.  Is now on Eliquis for anticoagulation and diltiazem for rate control.

## 2018-05-30 NOTE — Assessment & Plan Note (Addendum)
Seems to be tolerating the 20 mg of rosuvastatin with borderline target zone LDL.  Will be due for follow-up in roughly 6 months may be.  For now continue 20 mg Crestor, however if the LDL goes up probably need to adjust.

## 2018-05-30 NOTE — Assessment & Plan Note (Signed)
Blood pressure is usually better than it is today.  For now we will leave current meds alone to avoid any orthostatic symptoms, but may need to further titrate diltiazem as the only current option for uptitrate

## 2018-05-30 NOTE — Assessment & Plan Note (Signed)
Distant DES PCI.  No recurrent angina.  Last Myoview in 2016 was nonischemic.  No recurrent angina symptoms. Continue double calcium channel blockers with amlodipine and diltiazem (not on beta-blocker because of fatigue). Also on ARB and statin. On rosuvastatin 20 mg daily. Not on aspirin because of Eliquis

## 2018-05-30 NOTE — Assessment & Plan Note (Signed)
Normal EF with diastolic dysfunction on echo.  Currently euvolemic.  Probably is in the main reason why he does not do well with A. fib. Currently not on a diuretic.  Not to tolerate beta-blocker so is on diltiazem along with amlodipine and ARB.

## 2018-05-30 NOTE — Assessment & Plan Note (Signed)
Noted on his exam is asked to be, relatively asymptomatic.  He does have a history of A. fib and this could just simply be the substrate.  Did not tolerate beta-blocker, and with using diltiazem.

## 2018-06-05 DIAGNOSIS — L570 Actinic keratosis: Secondary | ICD-10-CM | POA: Diagnosis not present

## 2018-06-05 DIAGNOSIS — L814 Other melanin hyperpigmentation: Secondary | ICD-10-CM | POA: Diagnosis not present

## 2018-06-05 DIAGNOSIS — D2272 Melanocytic nevi of left lower limb, including hip: Secondary | ICD-10-CM | POA: Diagnosis not present

## 2018-06-05 DIAGNOSIS — D225 Melanocytic nevi of trunk: Secondary | ICD-10-CM | POA: Diagnosis not present

## 2018-06-05 DIAGNOSIS — D2262 Melanocytic nevi of left upper limb, including shoulder: Secondary | ICD-10-CM | POA: Diagnosis not present

## 2018-06-05 DIAGNOSIS — L304 Erythema intertrigo: Secondary | ICD-10-CM | POA: Diagnosis not present

## 2018-06-05 DIAGNOSIS — L821 Other seborrheic keratosis: Secondary | ICD-10-CM | POA: Diagnosis not present

## 2018-08-03 DIAGNOSIS — H81399 Other peripheral vertigo, unspecified ear: Secondary | ICD-10-CM | POA: Diagnosis not present

## 2018-08-03 DIAGNOSIS — M25512 Pain in left shoulder: Secondary | ICD-10-CM | POA: Diagnosis not present

## 2018-08-08 ENCOUNTER — Encounter: Payer: Self-pay | Admitting: Cardiology

## 2018-08-08 ENCOUNTER — Telehealth (INDEPENDENT_AMBULATORY_CARE_PROVIDER_SITE_OTHER): Payer: Medicare Other | Admitting: Cardiology

## 2018-08-08 ENCOUNTER — Telehealth: Payer: Self-pay | Admitting: *Deleted

## 2018-08-08 VITALS — Ht 69.0 in | Wt 201.0 lb

## 2018-08-08 DIAGNOSIS — Z7901 Long term (current) use of anticoagulants: Secondary | ICD-10-CM | POA: Diagnosis not present

## 2018-08-08 DIAGNOSIS — R5382 Chronic fatigue, unspecified: Secondary | ICD-10-CM

## 2018-08-08 DIAGNOSIS — E785 Hyperlipidemia, unspecified: Secondary | ICD-10-CM

## 2018-08-08 DIAGNOSIS — G8929 Other chronic pain: Secondary | ICD-10-CM | POA: Insufficient documentation

## 2018-08-08 DIAGNOSIS — M25512 Pain in left shoulder: Secondary | ICD-10-CM

## 2018-08-08 DIAGNOSIS — I480 Paroxysmal atrial fibrillation: Secondary | ICD-10-CM

## 2018-08-08 DIAGNOSIS — Z9861 Coronary angioplasty status: Secondary | ICD-10-CM

## 2018-08-08 DIAGNOSIS — I2510 Atherosclerotic heart disease of native coronary artery without angina pectoris: Secondary | ICD-10-CM

## 2018-08-08 DIAGNOSIS — I10 Essential (primary) hypertension: Secondary | ICD-10-CM

## 2018-08-08 MED ORDER — DILTIAZEM HCL ER COATED BEADS 120 MG PO CP24: 120.0000 mg | ORAL_CAPSULE | Freq: Every day | ORAL | 6 refills | Status: DC | PRN

## 2018-08-08 NOTE — Assessment & Plan Note (Signed)
On statin.  Lipids have been pretty well controlled in the past.  Just had lipids checked by PCP.  Will need to get results.  I am a little worried about fatigue, it could be related to statin.  We will try a 30-day statin holiday and reassess.  If symptoms have not changed, will restart.

## 2018-08-08 NOTE — Assessment & Plan Note (Signed)
Blood pressure has been stable.  I do not know how much diltiazem is contributing to blood pressure management, but seems to potentially be related to some fatigue.  We will have him wean off diltiazem taking 1 pill every other day for a week and then stop.

## 2018-08-08 NOTE — Assessment & Plan Note (Signed)
No bleeding on Eliquis.  Continue  This patients CHA2DS2-VASc Score and unadjusted Ischemic Stroke Rate (% per year) is equal to 7.2 % stroke rate/year from a score of 5  Above score calculated as 1 point each if present [CHF, HTN, DM, Vascular=MI/PAD/Aortic Plaque, Age if 65-74, or Male] Above score calculated as 2 points each if present [Age > 75, or Stroke/TIA/TE]

## 2018-08-08 NOTE — Progress Notes (Signed)
Virtual Visit via Video Note   This visit type was conducted due to national recommendations for restrictions regarding the COVID-19 Pandemic (e.g. social distancing) in an effort to limit this patient's exposure and mitigate transmission in our community.  Due to his co-morbid illnesses, this patient is at least at moderate risk for complications without adequate follow up.  This format is felt to be most appropriate for this patient at this time.  All issues noted in this document were discussed and addressed.  A limited physical exam was performed with this format.  Please refer to the patient's chart for his consent to telehealth for [facility].   Patient has given verbal permission to conduct this visit via virtual appointment and to bill insurance 08/08/2018 11:13 AM     Evaluation Performed:  Follow-up visit  Date:  08/08/2018   ID:  [name], DOB [birth year], MRN [id number]  Patient Location: Home Provider Location: Home  PCP:  Lavone Orn, MD  Cardiologist:  Glenetta Hew, MD  Electrophysiologist:  None   Chief Complaint: 87-month follow-up: CAD and PCI and relatively new A. fib.  History of Present Illness:    [name] is a 83 y.o. male with PMH notable for CAD having PCI and A. fib who presents via audio/video conferencing for a telehealth visit today.   He is a long-term patient of [name] who has h/o CAD-PCI back in 2006.   He was newly diagnosed with atrial fibrillation in September 19 --> on Eliquis for anticoagulation and rate control with diltiazem.  He is s/p re-do spinal Surgery for a non-malignant tumor  [name] was last seen in February 2020 -> was doing fairly well.  Just frustrated and his feet were getting worse.  Also having issues with bowel and bladder control.  Interval History:  Doing OK from Cardiac standpoint.  Notes fatigue - always tired.  Still has off & on L shoulder pain -- can occur at any time.  Does not note while  walking around the block -- just feels tired.   Cardiovascular ROS: no chest pain or dyspnea on exertion positive for - fatigue / tired; L shoulder pain; frequent falls negative for - edema, irregular heartbeat, loss of consciousness, orthopnea, palpitations, paroxysmal nocturnal dyspnea, rapid heart rate, shortness of breath or syncope; TIA/amaurosis fugax; melena, hematochezia, hematuria, epistaxis.  claudication  [place] several times getting out of bed a few days ago (5 days ago fell 5 x, 4 d ago 2-3 x --> today better, held on to wall to walk).  Felt dizzy - could not get balance.  Still walks with cane otherwise.   The patient does not have symptoms concerning for COVID-19 infection (fever, chills, cough, or new shortness of breath).  The patient is practicing social distancing.  Order food on-line.  Minimal excursions out besides occasional trips to the park. Walks around the block daily.   ROS:  Please see the history of present illness.    Review of Systems  Constitutional: Positive for malaise/fatigue.  HENT: Negative for congestion and nosebleeds.   Respiratory: Negative for cough, shortness of breath and wheezing.   Cardiovascular: Negative for leg swelling.  Gastrointestinal:       Can have bowel incontinence - but not as much  Musculoskeletal: Positive for falls (per HPI) and joint pain (L shoulder pain - better with pain pad).  Neurological: Positive for dizziness. Negative for focal weakness.  Psychiatric/Behavioral: Negative for depression and memory loss. The patient  is not nervous/anxious and does not have insomnia (wakes up to urinate; gets disoriented).   All other systems reviewed and are negative.   Past Medical History:  Diagnosis Date  . Anticoagulation adequate, Eliquis with [name] 12/12/2017  . Arthritis   . CAD S/P percutaneous coronary angioplasty 08/2004   CARDIOLOGIST-  DR Thanya Cegielski; DES to LCx. X2  - Cypher 2.5 mm postdilated to 2.75 mm (20 mm  and 13 mm stents);; Myoview 09/2014: LOW RISK. No Infarct or Ischemia.  . Colon polyps 05/10/2005   Hyperplastic polyps  . Complication of anesthesia    "takes long time to wake up"  . Depression   . Diabetes mellitus type 2 with complications (HCC)    neuropathy; CAD borderline- no med  . Diastolic dysfunction without heart failure    Moderate LVH, Gr 1 DD on Echo 2011; no CHF admissions, no tt on diuretisc  . Essential hypertension   . H/O: gout STABLE  . History of melanoma excision 2010   FOREHEAD  . History of prostate cancer 2004--  S/P SEED IMPLANTS    NO RECURRENCE  . Hydrocele, left   . Hyperlipidemia LDL goal <70   . Impaired hearing RIGHT HEARING AID  . OSA on CPAP    bipap  . PAF (paroxysmal atrial fibrillation) ([place]) 12/11/2017   Admitted with Afib RVR - converted on IV Diltiazem. d/c on Eliquis  . Prostate cancer ([place])    prostate , melanoma head  . Shingles 07  . Syncope and collapse March 2017   Likely related to post micturition, Neurontin and dehydration with orthostatic hypotension  . Urgency of urination    Past Surgical History:  Procedure Laterality Date  . Cardiac Event Monitor  3-07/2015   Mostly SR - 58-132 bpm. Rare PVCs (occ bigeminy), Frequent PACs - singlets, couplets - short runs of PAT.  . circumsision    . CORONARY ANGIOPLASTY WITH STENT PLACEMENT  09-01-2004     Cypher DES x 2 d-m LCx 2.5 mm x 20 mm & 2.5 mm x 13 mm (~2.75 mm)  . EXCISION MELANOMA FROM FOREHEAD  2010  . EYE SURGERY Bilateral    cataracts  . HYDROCELE EXCISION  10/14/2011   Procedure: HYDROCELECTOMY ADULT;  Surgeon: Ailene Rud, MD;  Location[facility];  Service: Urology;  Laterality: Left;  . LAMINECTOMY N/A 07/13/2012   Procedure: THORACIC LAMINECTOMY FOR RESECTION OF INTRAMEDULLARY SPINAL CHORD TUMOR WITH SPINAL CHORD MONITORING;  Surgeon: Erline Levine, MD;  Location: [place] [facility];  Service: Neurosurgery;  Laterality: N/A;  Thoracic laminectomy for  resection of intramedullary spinal cord tumor with spinal cord monitering and Dr. Christella Noa to assist  . LAMINECTOMY N/A 12/09/2014   Procedure: Redo Thoracic laminectomy with intramedullary tumar resection/USN/[place]/Subarachnoid shunt/spinal cord monitoring/Dr. Kathyrn Sheriff to assist;  Surgeon: Erline Levine, MD;  Location: [place] [facility];  Service: Neurosurgery;  Laterality: N/A;  Redo Thoracic laminectomy with intramedullary tumar resection/USN/[place]/Subarachnoid shunt/spinal cord monitoring/Dr. Kathyrn Sheriff to assist  . NM MYOVIEW LTD  3/212014; June 2016   Both Lexiscan: a. LOW RISK, mild inferior bowel artifact; No ischemia or Infarction; b. Low risk, normal study. No infarction or ischemia. No artifact.  Marland Kitchen PROSTATE PALLADIUM GOLD SEED IMPLANTS (118)  11-15-2002   PROSTATE CANCER  . SKIN BIOPSY    . TONSILLECTOMY    . TRANSTHORACIC ECHOCARDIOGRAM  12/2017   (A. fib): Moderate concentric LVH.  EF 55 to 60%.  No wall motion normality.  Mild to moderately  dilated left atrium.     Current Meds  Medication Sig  . allopurinol (ZYLOPRIM) 300 MG tablet Take 1 tablet (300 mg total) by mouth daily.  Marland Kitchen apixaban (ELIQUIS) 5 MG TABS tablet Take 1 tablet (5 mg total) by mouth 2 (two) times daily.  Marland Kitchen diltiazem (CARDIZEM CD) 120 MG 24 hr capsule Take 1 capsule (120 mg total) by mouth daily.  Marland Kitchen escitalopram (LEXAPRO) 10 MG tablet Take 1 tablet by mouth daily.  . metFORMIN (GLUCOPHAGE) 500 MG tablet Take 1 tablet (500 mg total) by mouth 2 (two) times daily with a meal.  . oxybutynin (DITROPAN XL) 15 MG 24 hr tablet Take 1 tablet (15 mg total) by mouth at bedtime.  . rosuvastatin (CRESTOR) 20 MG tablet Take 1 tablet (20 mg total) by mouth daily.  . tamsulosin (FLOMAX) 0.4 MG CAPS capsule Take 1 capsule (0.4 mg total) by mouth at bedtime.  Marland Kitchen telmisartan (MICARDIS) 40 MG tablet Take 1 tablet (40 mg total) by mouth daily.  Marland Kitchen VITAMIN D, CHOLECALCIFEROL, PO Take 1 tablet by mouth daily.      Allergies:   Betadine  [povidone iodine]; Contrast media [iodinated diagnostic agents]; Iodine; and Shellfish allergy   Social History   Tobacco Use  . Smoking status: Never Smoker  . Smokeless tobacco: Never Used  . Tobacco comment: occ alcohol  Substance Use Topics  . Alcohol use: Yes    Comment: OCCASIONAL  . Drug use: No     Family Hx: The patient's family history includes Hypertension in his brother, father, and mother; Prostate cancer in his brother and father; Stroke in his mother.   Prior CV studies:   The following studies were reviewed today: . 2D Echo 12/12/2017: EF 50.  Moderate concentric LVH.  No RW MA.  Moderate LA dilation.  Labs/Other Tests and Data Reviewed:    EKG:  No ECG reviewed.  Recent Labs: 12/11/2017: ALT 17; B Natriuretic Peptide 414.6; BUN 20; Creatinine, Ser 1.27; Magnesium 2.0; Potassium 4.1; Sodium 141; TSH 1.834 03/21/2018: Hemoglobin 14.3; Platelets 229   Recent Lipid Panel Checked by PCP - should have been within last month.   Wt Readings from Last 3 Encounters:  08/08/18 201 lb (91.2 kg)  05/28/18 202 lb (91.6 kg)  03/21/18 209 lb (94.8 kg)     Objective:    Vital Signs:  Ht 5\' 9"  (1.753 m)   Wt 201 lb (91.2 kg)   BMI 29.68 kg/m   VITAL SIGNS:  reviewed GEN:  Well nourished, well developed male in no acute distress. RESPIRATORY:  normal respiratory effort, symmetric expansion CARDIOVASCULAR:  no peripheral edema NEURO:  alert and oriented x 3, no obvious focal deficit;  normal Mood & Affect   ASSESSMENT & PLAN:    Problem List Items Addressed This Visit    Anticoagulation adequate, Eliquis with CHA2DS2VASc of 5 - Primary (Chronic)    No bleeding on Eliquis.  Continue  This patients CHA2DS2-VASc Score and unadjusted Ischemic Stroke Rate (% per year) is equal to 7.2 % stroke rate/year from a score of 5  Above score calculated as 1 point each if present [CHF, HTN, DM, Vascular=MI/PAD/Aortic Plaque, Age if 65-74, or Male] Above score calculated as 2  points each if present [Age > 75, or Stroke/TIA/TE]       CAD S/P percutaneous coronary angioplasty: Cypher DES x2 to circumflex (Chronic)    Distant history of PCI, did not have symptoms at that time.  Left shoulder pain does not seem  to be cardiac in nature, but is still present follow-up we will recheck for ischemia with a Myoview.  Is no longer on aspirin or Plavix because of Eliquis. Not on beta-blocker because of fatigue.  Continue ARB.  Action-wean off diltiazem because of fatigue. Currently on statin which we will actually have him do a 30-day statin holiday because of fatigue.      Chronic fatigue    Hard to tell really what is causing this fatigue issue.  He certainly was extremely fatigued with beta-blockers and so those were stopped.  I did reduce his diltiazem, no real benefit.  We will simply have him wean off diltiazem and use it as needed.  Not anemic based on last check.  I think this is probably simply related to deconditioning and poor sleep, however we will try a 30-day statin holiday and wean him off diltiazem. Continue to encourage exercise.      Chronic left shoulder pain    Sounds mostly musculoskeletal--  Occurs at rest and not associated with activity.  Would recommend heating pad and pain patch that he is using. If still present when seen in follow-up, we will consider ischemic evaluation with Myoview just to be sure .      Essential hypertension (Chronic)    Blood pressure has been stable.  I do not know how much diltiazem is contributing to blood pressure management, but seems to potentially be related to some fatigue.  We will have him wean off diltiazem taking 1 pill every other day for a week and then stop.      Hyperlipidemia LDL goal <70 (Chronic)    On statin.  Lipids have been pretty well controlled in the past.  Just had lipids checked by PCP.  Will need to get results.  I am a little worried about fatigue, it could be related to statin.  We  will try a 30-day statin holiday and reassess.  If symptoms have not changed, will restart.      PAF (paroxysmal atrial fibrillation) (HCC) (Chronic)    Has not had any further episodes as far as I can tell.  He is on Eliquis for anticoagulation. Has been on diltiazem for rate control, but with ongoing fatigue and no further episodes, I will actually have him wean off diltiazem and use it only as needed basis.         COVID-19 Education: The signs and symptoms of COVID-19 were discussed with the patient and how to seek care for testing (follow up with PCP or arrange E-visit).   The importance of social distancing was discussed today.  Time:   Today, I have spent 23 minutes with the patient with telehealth technology discussing the above problems.     Medication Adjustments/Labs and Tests Ordered: Current medicines are reviewed at length with the patient today.  Concerns regarding medicines are outlined above.  Medication Instructions:    Wean off Diltiazem - QOD x 1 week, then stop  Use as needed for rapid HR symptoms.  After 2 weeks - do 30 day Rosuvastatin break (if no improvement of fatigue after 30 days, restart)  Recommend heating pad for Left shoulder  Tests Ordered: No orders of the defined types were placed in this encounter.  need labs from PCP office last month  Medication Changes: No orders of the defined types were placed in this encounter. Wean off Diltiazem 30 day statin Holiday for fatigue.   Disposition:  Follow up in 4 month(s)  Signed, Glenetta Hew, MD  08/08/2018 11:13 AM    [place]

## 2018-08-08 NOTE — Assessment & Plan Note (Signed)
Distant history of PCI, did not have symptoms at that time.  Left shoulder pain does not seem to be cardiac in nature, but is still present follow-up we will recheck for ischemia with a Myoview.  Is no longer on aspirin or Plavix because of Eliquis. Not on beta-blocker because of fatigue.  Continue ARB.  Action-wean off diltiazem because of fatigue. Currently on statin which we will actually have him do a 30-day statin holiday because of fatigue.

## 2018-08-08 NOTE — Patient Instructions (Addendum)
Medication Instructions:    Wean off Diltiazem - QOD x 1 week, then stop  Use as needed for rapid HR symptoms.  After 2 weeks - do 30 day Rosuvastatin break (if no improvement of fatigue after 30 days, restart)  Recommend heating pad for Left shoulder  If you need a refill on your cardiac medications before your next appointment, please call your pharmacy.   Lab work:  Need to get labs from PCP office (last month)  If you have labs (blood work) drawn today and your tests are completely normal, you will receive your results only by: Marland Kitchen MyChart Message (if you have MyChart) OR . A paper copy in the mail If you have any lab test that is abnormal or we need to change your treatment, we will call you to review the results.  Testing/Procedures: none  Follow-Up: At [facility], you and your health needs are our priority.  As part of our continuing mission to provide you with exceptional heart care, we have created designated Provider Care Teams.  These Care Teams include your primary Cardiologist (physician) and Advanced Practice Providers (APPs -  Physician Assistants and Nurse Practitioners) who all work together to provide you with the care you need, when you need it. You will need a follow up appointment in   4      months  SEPT 2020.  Please call our office 2 months in advance to schedule this appointment.  You may see Glenetta Hew, MD   or one of the following Advanced Practice Providers on your designated Care Team:   Rosaria Ferries, PA-C . Jory Sims, DNP, ANP  Any Other Special Instructions Will Be Listed Below (If Applicable).

## 2018-08-08 NOTE — Telephone Encounter (Signed)
SPOKE TO PATIENT - INSTRUCTION REVIEWED VIA MYCHART - AVS SUMMARY WILL BE MAILED. PATIENT VOICED UNDERSTANDING- F/U APPT MADE FOR 9/11 AT 2:40 PM

## 2018-08-08 NOTE — Assessment & Plan Note (Signed)
Has not had any further episodes as far as I can tell.  He is on Eliquis for anticoagulation. Has been on diltiazem for rate control, but with ongoing fatigue and no further episodes, I will actually have him wean off diltiazem and use it only as needed basis.

## 2018-08-08 NOTE — Assessment & Plan Note (Signed)
Sounds mostly musculoskeletal--  Occurs at rest and not associated with activity.  Would recommend heating pad and pain patch that he is using. If still present when seen in follow-up, we will consider ischemic evaluation with Myoview just to be sure .

## 2018-08-08 NOTE — Assessment & Plan Note (Signed)
Hard to tell really what is causing this fatigue issue.  He certainly was extremely fatigued with beta-blockers and so those were stopped.  I did reduce his diltiazem, no real benefit.  We will simply have him wean off diltiazem and use it as needed.  Not anemic based on last check.  I think this is probably simply related to deconditioning and poor sleep, however we will try a 30-day statin holiday and wean him off diltiazem. Continue to encourage exercise.

## 2018-08-30 DIAGNOSIS — M549 Dorsalgia, unspecified: Secondary | ICD-10-CM | POA: Diagnosis not present

## 2018-09-18 ENCOUNTER — Telehealth: Payer: Self-pay | Admitting: Cardiology

## 2018-09-18 DIAGNOSIS — Z961 Presence of intraocular lens: Secondary | ICD-10-CM | POA: Diagnosis not present

## 2018-09-18 DIAGNOSIS — E119 Type 2 diabetes mellitus without complications: Secondary | ICD-10-CM | POA: Diagnosis not present

## 2018-09-18 DIAGNOSIS — Z7984 Long term (current) use of oral hypoglycemic drugs: Secondary | ICD-10-CM | POA: Diagnosis not present

## 2018-09-18 MED ORDER — APIXABAN 5 MG PO TABS: 5.0000 mg | ORAL_TABLET | Freq: Two times a day (BID) | ORAL | 3 refills | Status: DC

## 2018-09-18 NOTE — Telephone Encounter (Signed)
Patient  Wanted to know what medicaton were prescribed by [facility]  ALL MEDICATION ARE DAILY   ROSUVASTATIN  TELMISARTAN ELIQUIS    EXCEPT:  DILTIAZEM - CHANGE TO AS NEEDED   patient request a new rx sent to pharmacy- done  He was filling his pill box for the week . Will send a copy avs summary patient state he did lookat in [employer]

## 2018-09-18 NOTE — Telephone Encounter (Signed)
Patient wants clarification on the medications he should and should not be taking. He is filling his pill box, and wants to know which ones to re-order

## 2018-09-19 DIAGNOSIS — I10 Essential (primary) hypertension: Secondary | ICD-10-CM | POA: Diagnosis not present

## 2018-09-19 DIAGNOSIS — I2510 Atherosclerotic heart disease of native coronary artery without angina pectoris: Secondary | ICD-10-CM | POA: Diagnosis not present

## 2018-09-19 DIAGNOSIS — Z8546 Personal history of malignant neoplasm of prostate: Secondary | ICD-10-CM | POA: Diagnosis not present

## 2018-09-19 DIAGNOSIS — I701 Atherosclerosis of renal artery: Secondary | ICD-10-CM | POA: Diagnosis not present

## 2018-09-19 DIAGNOSIS — F325 Major depressive disorder, single episode, in full remission: Secondary | ICD-10-CM | POA: Diagnosis not present

## 2018-09-19 DIAGNOSIS — N182 Chronic kidney disease, stage 2 (mild): Secondary | ICD-10-CM | POA: Diagnosis not present

## 2018-09-19 DIAGNOSIS — E1122 Type 2 diabetes mellitus with diabetic chronic kidney disease: Secondary | ICD-10-CM | POA: Diagnosis not present

## 2018-09-19 DIAGNOSIS — E782 Mixed hyperlipidemia: Secondary | ICD-10-CM | POA: Diagnosis not present

## 2018-09-19 DIAGNOSIS — I480 Paroxysmal atrial fibrillation: Secondary | ICD-10-CM | POA: Diagnosis not present

## 2018-09-24 ENCOUNTER — Telehealth: Payer: Self-pay | Admitting: Cardiology

## 2018-09-24 NOTE — Telephone Encounter (Signed)
Returned call to patient he stated he use to see Dr.Kelly for sleep apnea.He has not saw him in a long time.Stated he needs a new mask.Advised I will send message to sleep coordinator Mariann Laster.

## 2018-09-24 NOTE — Telephone Encounter (Signed)
Patient would like to ask Dr Ellyn Hack if he could put in for Mr Watford to get a sleep study. He used to wear a CPAP and hasn't wore one in a long time. He feels like he needs to go back to wearing one and needs to get a new study done.

## 2018-09-28 DIAGNOSIS — M545 Low back pain: Secondary | ICD-10-CM | POA: Diagnosis not present

## 2018-09-28 DIAGNOSIS — M25512 Pain in left shoulder: Secondary | ICD-10-CM | POA: Diagnosis not present

## 2018-09-28 DIAGNOSIS — M549 Dorsalgia, unspecified: Secondary | ICD-10-CM | POA: Diagnosis not present

## 2018-10-03 DIAGNOSIS — M545 Low back pain: Secondary | ICD-10-CM | POA: Diagnosis not present

## 2018-10-03 DIAGNOSIS — M25512 Pain in left shoulder: Secondary | ICD-10-CM | POA: Diagnosis not present

## 2018-10-03 DIAGNOSIS — M549 Dorsalgia, unspecified: Secondary | ICD-10-CM | POA: Diagnosis not present

## 2018-10-09 DIAGNOSIS — C720 Malignant neoplasm of spinal cord: Secondary | ICD-10-CM | POA: Diagnosis not present

## 2018-10-09 DIAGNOSIS — I10 Essential (primary) hypertension: Secondary | ICD-10-CM | POA: Diagnosis not present

## 2018-10-09 DIAGNOSIS — N3281 Overactive bladder: Secondary | ICD-10-CM | POA: Diagnosis not present

## 2018-10-09 DIAGNOSIS — N182 Chronic kidney disease, stage 2 (mild): Secondary | ICD-10-CM | POA: Diagnosis not present

## 2018-10-09 DIAGNOSIS — Z7984 Long term (current) use of oral hypoglycemic drugs: Secondary | ICD-10-CM | POA: Diagnosis not present

## 2018-10-09 DIAGNOSIS — E1122 Type 2 diabetes mellitus with diabetic chronic kidney disease: Secondary | ICD-10-CM | POA: Diagnosis not present

## 2018-10-10 DIAGNOSIS — M549 Dorsalgia, unspecified: Secondary | ICD-10-CM | POA: Diagnosis not present

## 2018-10-10 DIAGNOSIS — M25512 Pain in left shoulder: Secondary | ICD-10-CM | POA: Diagnosis not present

## 2018-10-10 DIAGNOSIS — M545 Low back pain: Secondary | ICD-10-CM | POA: Diagnosis not present

## 2018-10-17 DIAGNOSIS — M545 Low back pain: Secondary | ICD-10-CM | POA: Diagnosis not present

## 2018-10-17 DIAGNOSIS — M25512 Pain in left shoulder: Secondary | ICD-10-CM | POA: Diagnosis not present

## 2018-10-17 DIAGNOSIS — M549 Dorsalgia, unspecified: Secondary | ICD-10-CM | POA: Diagnosis not present

## 2018-10-24 DIAGNOSIS — M549 Dorsalgia, unspecified: Secondary | ICD-10-CM | POA: Diagnosis not present

## 2018-10-24 DIAGNOSIS — M545 Low back pain: Secondary | ICD-10-CM | POA: Diagnosis not present

## 2018-10-24 DIAGNOSIS — M25512 Pain in left shoulder: Secondary | ICD-10-CM | POA: Diagnosis not present

## 2018-11-01 DIAGNOSIS — F325 Major depressive disorder, single episode, in full remission: Secondary | ICD-10-CM | POA: Diagnosis not present

## 2018-11-01 DIAGNOSIS — I10 Essential (primary) hypertension: Secondary | ICD-10-CM | POA: Diagnosis not present

## 2018-11-01 DIAGNOSIS — N182 Chronic kidney disease, stage 2 (mild): Secondary | ICD-10-CM | POA: Diagnosis not present

## 2018-11-01 DIAGNOSIS — I480 Paroxysmal atrial fibrillation: Secondary | ICD-10-CM | POA: Diagnosis not present

## 2018-11-01 DIAGNOSIS — I701 Atherosclerosis of renal artery: Secondary | ICD-10-CM | POA: Diagnosis not present

## 2018-11-01 DIAGNOSIS — Z8546 Personal history of malignant neoplasm of prostate: Secondary | ICD-10-CM | POA: Diagnosis not present

## 2018-11-01 DIAGNOSIS — I2510 Atherosclerotic heart disease of native coronary artery without angina pectoris: Secondary | ICD-10-CM | POA: Diagnosis not present

## 2018-11-01 DIAGNOSIS — E1122 Type 2 diabetes mellitus with diabetic chronic kidney disease: Secondary | ICD-10-CM | POA: Diagnosis not present

## 2018-11-01 DIAGNOSIS — E782 Mixed hyperlipidemia: Secondary | ICD-10-CM | POA: Diagnosis not present

## 2018-11-01 DIAGNOSIS — Z7984 Long term (current) use of oral hypoglycemic drugs: Secondary | ICD-10-CM | POA: Diagnosis not present

## 2018-11-30 DIAGNOSIS — Z23 Encounter for immunization: Secondary | ICD-10-CM | POA: Diagnosis not present

## 2018-12-03 DIAGNOSIS — I2510 Atherosclerotic heart disease of native coronary artery without angina pectoris: Secondary | ICD-10-CM | POA: Diagnosis not present

## 2018-12-03 DIAGNOSIS — N182 Chronic kidney disease, stage 2 (mild): Secondary | ICD-10-CM | POA: Diagnosis not present

## 2018-12-03 DIAGNOSIS — I701 Atherosclerosis of renal artery: Secondary | ICD-10-CM | POA: Diagnosis not present

## 2018-12-03 DIAGNOSIS — E1122 Type 2 diabetes mellitus with diabetic chronic kidney disease: Secondary | ICD-10-CM | POA: Diagnosis not present

## 2018-12-03 DIAGNOSIS — Z8546 Personal history of malignant neoplasm of prostate: Secondary | ICD-10-CM | POA: Diagnosis not present

## 2018-12-03 DIAGNOSIS — E782 Mixed hyperlipidemia: Secondary | ICD-10-CM | POA: Diagnosis not present

## 2018-12-03 DIAGNOSIS — F325 Major depressive disorder, single episode, in full remission: Secondary | ICD-10-CM | POA: Diagnosis not present

## 2018-12-03 DIAGNOSIS — I480 Paroxysmal atrial fibrillation: Secondary | ICD-10-CM | POA: Diagnosis not present

## 2018-12-03 DIAGNOSIS — I10 Essential (primary) hypertension: Secondary | ICD-10-CM | POA: Diagnosis not present

## 2018-12-03 DIAGNOSIS — Z7984 Long term (current) use of oral hypoglycemic drugs: Secondary | ICD-10-CM | POA: Diagnosis not present

## 2018-12-04 DIAGNOSIS — Z7984 Long term (current) use of oral hypoglycemic drugs: Secondary | ICD-10-CM | POA: Diagnosis not present

## 2018-12-04 DIAGNOSIS — I2510 Atherosclerotic heart disease of native coronary artery without angina pectoris: Secondary | ICD-10-CM | POA: Diagnosis not present

## 2018-12-04 DIAGNOSIS — I480 Paroxysmal atrial fibrillation: Secondary | ICD-10-CM | POA: Diagnosis not present

## 2018-12-04 DIAGNOSIS — E782 Mixed hyperlipidemia: Secondary | ICD-10-CM | POA: Diagnosis not present

## 2018-12-04 DIAGNOSIS — N182 Chronic kidney disease, stage 2 (mild): Secondary | ICD-10-CM | POA: Diagnosis not present

## 2018-12-04 DIAGNOSIS — Z8546 Personal history of malignant neoplasm of prostate: Secondary | ICD-10-CM | POA: Diagnosis not present

## 2018-12-04 DIAGNOSIS — I10 Essential (primary) hypertension: Secondary | ICD-10-CM | POA: Diagnosis not present

## 2018-12-04 DIAGNOSIS — E1122 Type 2 diabetes mellitus with diabetic chronic kidney disease: Secondary | ICD-10-CM | POA: Diagnosis not present

## 2018-12-04 DIAGNOSIS — F325 Major depressive disorder, single episode, in full remission: Secondary | ICD-10-CM | POA: Diagnosis not present

## 2018-12-04 DIAGNOSIS — I701 Atherosclerosis of renal artery: Secondary | ICD-10-CM | POA: Diagnosis not present

## 2018-12-10 ENCOUNTER — Encounter

## 2018-12-14 ENCOUNTER — Other Ambulatory Visit: Payer: Self-pay

## 2018-12-14 ENCOUNTER — Ambulatory Visit (INDEPENDENT_AMBULATORY_CARE_PROVIDER_SITE_OTHER): Payer: Medicare Other | Admitting: Cardiology

## 2018-12-14 ENCOUNTER — Encounter: Payer: Self-pay | Admitting: Cardiology

## 2018-12-14 VITALS — BP 140/69 | HR 69 | Temp 98.1°F | Ht 69.5 in | Wt 202.0 lb

## 2018-12-14 DIAGNOSIS — I10 Essential (primary) hypertension: Secondary | ICD-10-CM | POA: Diagnosis not present

## 2018-12-14 DIAGNOSIS — Z9861 Coronary angioplasty status: Secondary | ICD-10-CM

## 2018-12-14 DIAGNOSIS — I5189 Other ill-defined heart diseases: Secondary | ICD-10-CM

## 2018-12-14 DIAGNOSIS — I2510 Atherosclerotic heart disease of native coronary artery without angina pectoris: Secondary | ICD-10-CM | POA: Diagnosis not present

## 2018-12-14 DIAGNOSIS — G4733 Obstructive sleep apnea (adult) (pediatric): Secondary | ICD-10-CM | POA: Diagnosis not present

## 2018-12-14 DIAGNOSIS — I480 Paroxysmal atrial fibrillation: Secondary | ICD-10-CM

## 2018-12-14 DIAGNOSIS — Z9989 Dependence on other enabling machines and devices: Secondary | ICD-10-CM

## 2018-12-14 DIAGNOSIS — Z7901 Long term (current) use of anticoagulants: Secondary | ICD-10-CM | POA: Diagnosis not present

## 2018-12-14 DIAGNOSIS — E785 Hyperlipidemia, unspecified: Secondary | ICD-10-CM | POA: Diagnosis not present

## 2018-12-14 NOTE — Assessment & Plan Note (Addendum)
Delay in appt to see Dr. Claiborne Billings to get new CPAP machine.  Is hoping that once this get started he will sleep better.

## 2018-12-14 NOTE — Patient Instructions (Addendum)
Medication Instructions:  NO CHANGES If you need a refill on your cardiac medications before your next appointment, please call your pharmacy.   Lab work: NOT NEEDED   Testing/Procedures: NOT NEEDED  Follow-Up: At [facility], you and your health needs are our priority.  As part of our continuing mission to provide you with exceptional heart care, we have created designated Provider Care Teams.  These Care Teams include your primary Cardiologist (physician) and Advanced Practice Providers (APPs -  Physician Assistants and Nurse Practitioners) who all work together to provide you with the care you need, when you need it. . You will need a follow up appointment in    8  Months- MAY 2021 .  Please call our office 2 months in advance to schedule this appointment.  You may see Glenetta Hew, MD or one of the following Advanced Practice Providers on your designated Care Team:   . Rosaria Ferries, PA-C . Jory Sims, DNP, ANP  Any Other Special Instructions Will Be Listed Below (If Applicable).

## 2018-12-14 NOTE — [facility] Notes (Signed)
PCP: Lavone Orn, MD  Clinic Note: Chief Complaint  Patient presents with  . Follow-up  . Atrial Fibrillation    New diagnosis  . Coronary Artery Disease    No angina    HPI: [name] is a 83 y.o. male with a PMH of CAD-PCI and relatively recent diagnosis of A. fib who presents today for 39-month follow-up  He is a long-term patient of [facility] who has h/o CAD-PCI back in 2006.  Admitted in September 2019 and found to have atrial fibrillation -> started on anticoagulation with Eliquis and diltiazem for rate control..  Recent Hospitalizations: none  [name] was most recently seen on Aug 08, 2018 for telehealth conferencing.  He was doing okay from a cardiac standpoint but just noted that he was always fatigued.  Gets tired walking around the block.  Otherwise no real cardiac symptoms.  Has had several falls leading up to this visit.  Felt dizzy with poor balance. --> Weaned down diltiazem.  Studies Personally Reviewed - (if available, images/films reviewed: From Epic Chart or Care Everywhere)  none  Interval History: [name] presents today back to his normal jovial self.  He says that he definitely notes improved energy levels with the decreased diltiazem dose.  Less dizzy now.  No more falls.  He is still a bit frustrated with not being able to get into a pool to do his pool walking.  That is really all he can do for good exercise since his back and legs trouble and so much.  He has no real feeling below his knees which makes walking somewhat dangerous.  He has not noted any recurrent symptoms of A. fib such as irregular or rapid heartbeats.  No chest pain or pressure with rest or exertion.  He has some some mild edema mostly because of his neuropathy, but no PND or orthopnea.  No bleeding issues on Eliquis.  No syncope/near syncope or TIA/amaurosis fugax symptoms.   ROS: A comprehensive was performed. Review of Systems  Constitutional: Positive for malaise/fatigue  (Energy level much better with reduced dose of diltiazem).  HENT: Negative for congestion, nosebleeds and sore throat.   Respiratory: Positive for cough (Off and on). Negative for sputum production, shortness of breath and wheezing.   Gastrointestinal: Negative for blood in stool, diarrhea (Not diarrhea, just loose stools) and melena.       Has a hard time regulating his bowels.  He can wake up and have soiled himself and not known.  Genitourinary: Negative for hematuria.       Nocturia 2-3 x /night - has difficulty controlling urine/bowels.  Musculoskeletal: Positive for back pain. Negative for falls and joint pain.       Still having issues from his back surgery with radicular pains, no leg weakness as well as loss of bowel and bladder control.  Neurological: Positive for tingling (Feet still numb), focal weakness (Lower legs) and headaches. Negative for dizziness (Last dizzy now with higher blood pressure).       Falls asleep easily - if lies down - sleeping within 10 min.   Psychiatric/Behavioral: Negative for depression and memory loss. The patient has insomnia (More because of nocturia). The patient is not nervous/anxious.        He does not really have anxiety, he just is frustrated with his issues with bowel and bladder control as well as difficulty walking because of his back surgery.  All other systems reviewed and are negative.  The patient  does not have symptoms concerning for COVID-19 infection (fever, chills, cough, or new shortness of breath).  The patient is practicing social distancing.  I have reviewed and (if needed) personally updated the patient's problem list, medications, allergies, past medical and surgical history, social and family history.   Past Medical History:  Diagnosis Date  . Arthritis   . CAD S/P percutaneous coronary angioplasty 08/2004   CARDIOLOGIST-  DR Vermell [place]; DES to LCx. X2  - Cypher 2.5 mm postdilated to 2.75 mm (20 mm and 13 mm stents);;  Myoview 09/2014: LOW RISK. No Infarct or Ischemia.  . Colon polyps 05/10/2005   Hyperplastic polyps  . Complication of anesthesia    "takes long time to wake up"  . Depression   . Diabetes mellitus type 2 with complications (HCC)    neuropathy; CAD borderline- no med  . Diastolic dysfunction without heart failure    Moderate LVH, Gr 1 DD on Echo 2011; no CHF admissions, no tt on diuretisc  . Essential hypertension   . H/O: gout STABLE  . History of melanoma excision 2010   FOREHEAD  . History of prostate cancer 2004--  S/P SEED IMPLANTS    NO RECURRENCE  . Hydrocele, left   . Hyperlipidemia LDL goal <70   . Impaired hearing RIGHT HEARING AID  . OSA on CPAP    bipap  . PAF (paroxysmal atrial fibrillation) ([place]) 12/11/2017   Admitted with Afib RVR - converted on IV Diltiazem. d/c on Eliquis; CHA2DS2Vasc = 5.  . Prostate cancer ([place])    prostate , melanoma head  . Shingles 07  . Syncope and collapse March 2017   Likely related to post micturition, Neurontin and dehydration with orthostatic hypotension  . Urgency of urination     Past Surgical History:  Procedure Laterality Date  . Cardiac Event Monitor  3-07/2015   Mostly SR - 58-132 bpm. Rare PVCs (occ bigeminy), Frequent PACs - singlets, couplets - short runs of PAT.  . circumsision    . CORONARY ANGIOPLASTY WITH STENT PLACEMENT  09-01-2004     Cypher DES x 2 d-m LCx 2.5 mm x 20 mm & 2.5 mm x 13 mm (~2.75 mm)  . EXCISION MELANOMA FROM FOREHEAD  2010  . EYE SURGERY Bilateral    cataracts  . HYDROCELE EXCISION  10/14/2011   Procedure: HYDROCELECTOMY ADULT;  Surgeon: Ailene Rud, MD;  Location: [facility];  Service: Urology;  Laterality: Left;  . LAMINECTOMY N/A 07/13/2012   Procedure: THORACIC LAMINECTOMY FOR RESECTION OF INTRAMEDULLARY SPINAL CHORD TUMOR WITH SPINAL CHORD MONITORING;  Surgeon: Erline Levine, MD;  Location: [facility];  Service: Neurosurgery;  Laterality: N/A;  Thoracic laminectomy for  resection of intramedullary spinal cord tumor with spinal cord monitering and Dr. Christella Noa to assist  . LAMINECTOMY N/A 12/09/2014   Procedure: Redo Thoracic laminectomy with intramedullary tumar resection/USN/[place]/Subarachnoid shunt/spinal cord monitoring/Dr. Kathyrn Sheriff to assist;  Surgeon: Erline Levine, MD;  Location: [facility];  Service: Neurosurgery;  Laterality: N/A;  Redo Thoracic laminectomy with intramedullary tumar resection/USN/[place]/Subarachnoid shunt/spinal cord monitoring/Dr. Kathyrn Sheriff to assist  . NM MYOVIEW LTD  3/212014; June 2016   Both Lexiscan: a. LOW RISK, mild inferior bowel artifact; No ischemia or Infarction; b. Low risk, normal study. No infarction or ischemia. No artifact.  Marland Kitchen PROSTATE PALLADIUM GOLD SEED IMPLANTS (118)  11-15-2002   PROSTATE CANCER  . SKIN BIOPSY    . TONSILLECTOMY    . TRANSTHORACIC ECHOCARDIOGRAM  12/2017   (A.  fib): Moderate concentric LVH.  EF 55 to 60%.  No wall motion normality.  Mild to moderately dilated left atrium.    Transthoracic Echo September 2019 (A. fib): Moderate concentric LVH.  EF 55 to 60%.  No wall motion normality.  Mild to moderately dilated left atrium.  Current Meds  Medication Sig  . allopurinol (ZYLOPRIM) 300 MG tablet Take 1 tablet (300 mg total) by mouth daily.  Marland Kitchen apixaban (ELIQUIS) 5 MG TABS tablet Take 1 tablet (5 mg total) by mouth 2 (two) times daily.  Marland Kitchen diltiazem (CARDIZEM CD) 120 MG 24 hr capsule Take 1 capsule (120 mg total) by mouth daily as needed.  Marland Kitchen escitalopram (LEXAPRO) 10 MG tablet Take 1 tablet by mouth daily.  . metFORMIN (GLUCOPHAGE) 500 MG tablet Take 1 tablet (500 mg total) by mouth 2 (two) times daily with a meal.  . oxybutynin (DITROPAN XL) 15 MG 24 hr tablet Take 1 tablet (15 mg total) by mouth at bedtime.  . rosuvastatin (CRESTOR) 20 MG tablet Take 1 tablet (20 mg total) by mouth daily.  . tamsulosin (FLOMAX) 0.4 MG CAPS capsule Take 1 capsule (0.4 mg total) by mouth at bedtime.  Marland Kitchen telmisartan  (MICARDIS) 40 MG tablet Take 1 tablet (40 mg total) by mouth daily.  Marland Kitchen VITAMIN D, CHOLECALCIFEROL, PO Take 1 tablet by mouth daily.     Allergies  Allergen Reactions  . Betadine [Povidone Iodine] Anaphylaxis  . Contrast Media [Iodinated Diagnostic Agents] Anaphylaxis and Rash    Other Reaction: Other reaction  . Iodine Anaphylaxis    Shellfish, IVP dye. Swelling in throat and foaming at mouth.  . Shellfish Allergy Anaphylaxis    Social History   Tobacco Use  . Smoking status: Never Smoker  . Smokeless tobacco: Never Used  . Tobacco comment: occ alcohol  Substance Use Topics  . Alcohol use: Yes    Comment: OCCASIONAL  . Drug use: No   Social History   Social History Narrative   He is married, father of 2, grandfather of 31. Does not really get exercise now because   of his back issues. Does not smoke and drinks social alcohol. Works in [employer].    family history includes Hypertension in his brother, father, and mother; Prostate cancer in his brother and father; Stroke in his mother.  Wt Readings from Last 3 Encounters:  12/14/18 202 lb (91.6 kg)  08/08/18 201 lb (91.2 kg)  05/28/18 202 lb (91.6 kg)    PHYSICAL EXAM BP 140/69   Pulse 69   Temp 98.1 F (36.7 C)   Ht 5' 9.5" (1.765 m)   Wt 202 lb (91.6 kg)   SpO2 96%   BMI 29.40 kg/m  Physical Exam  Constitutional: He is oriented to person, place, and time. He appears well-developed and well-nourished. No distress.  Healthy-appearing.  Well-groomed  HENT:  Head: Normocephalic and atraumatic.  Neck: Normal range of motion. Neck supple. No hepatojugular reflux and no JVD present. Carotid bruit is not present.  Cardiovascular: Normal rate, regular rhythm and normal heart sounds.  Occasional extrasystoles are present. PMI is not displaced. Exam reveals no gallop, no friction rub and no decreased pulses.  No murmur heard. Pulmonary/Chest: Effort normal and breath sounds normal. No respiratory distress. He has no  wheezes.  Abdominal: Soft. Bowel sounds are normal. He exhibits no distension. There is no abdominal tenderness. There is no rebound.  Musculoskeletal: Normal range of motion.        General: No edema (  Trivial 1+ bilateral).  Neurological: He is alert and oriented to person, place, and time.  Walks with a cane.  Psychiatric: He has a normal mood and affect. His behavior is normal. Judgment and thought content normal.  Back to his full normal mood and affect.  Vitals reviewed.   Adult ECG Report NSR, 69 bpm.  Normal axis, intervals and durations  Other studies Reviewed: Additional studies/ records that were reviewed today include:  Recent Labs: Lab Results  Component Value Date   WBC 9.4 03/21/2018   HGB 14.3 03/21/2018   HCT 42.7 03/21/2018   MCV 87 03/21/2018   PLT 229 03/21/2018   Lab Results  Component Value Date   TSH 1.834 12/11/2017   Lipids from January 2020: TC 130, HDL 37, LDL 60, TG 140.  A1c 6.5.  Hemoglobin 14.3.  Creatinine is 1.38.  Potassium 4.2.  TSH 1.83.   ASSESSMENT / PLAN: Problem List Items Addressed This Visit    Anticoagulation adequate, Eliquis with CHA2DS2VASc of 5 (Chronic)    Doing well on Eliquis.      CAD S/P percutaneous coronary angioplasty: Cypher DES x2 to circumflex - Primary (Chronic)    No anginal symptoms.  Has not really had any issues since his cath and PCI years ago.  Not on aspirin or Plavix because of Eliquis.  Not on beta-blocker because of fatigue, is on diltiazem which is acceptable with normal EF.  Is on statin and ARB.      Relevant Orders   EKG 93-TTSV   Diastolic dysfunction without heart failure (Chronic)    With normal blood pressure and heart rate he seems to do fine.  He only becomes symptomatic if he goes in A. fib.  He is not on a diuretic.  Is on ARB and diltiazem      Relevant Orders   EKG 12-Lead   Essential hypertension (Chronic)    Blood pressure is a little high today.  He tells me it is usually better than  this.  With him having less dizziness allow for mild permissive hypertension and not titrate medications further.  On stable dose of Micardis and diltiazem.      Relevant Orders   EKG 12-Lead   Hyperlipidemia LDL goal <70 (Chronic)    Lipids as of January are well controlled.  LDL 60 on 20 of rosuvastatin with energy improving, we are not can adjust statin holiday.  No change.      Relevant Orders   EKG 12-Lead   OSA on CPAP (Chronic)    Delay in appt to see Dr. Claiborne Billings to get new CPAP machine.  Is hoping that once this get started he will sleep better.      Relevant Orders   EKG 12-Lead   PAF (paroxysmal atrial fibrillation) ; CHA2DS2-VASc Score =5 (Eliquis) (Chronic)     Now on lower dose diltiazem, rate seems to be pretty well controlled.  No recurrent A. fib. On Eliquis with no bleeding.      Relevant Orders   EKG 12-Lead      COVID-19 Education: The signs and symptoms of COVID-19 were discussed with the patient and how to seek care for testing (follow up with PCP or arrange E-visit).   The importance of social distancing was discussed today.   I spent a total of 27 minutes with the patient and chart review. >  50% of the time was spent in direct patient consultation.   Current medicines are reviewed at length with  the patient today.  (+/- concerns) just feeling fatigued The following changes have been made:  See below  Patient Instructions  Medication Instructions:  NO CHANGES If you need a refill on your cardiac medications before your next appointment, please call your pharmacy.   Lab work: NOT NEEDED   Testing/Procedures: NOT NEEDED  Follow-Up: At [facility], you and your health needs are our priority.  As part of our continuing mission to provide you with exceptional heart care, we have created designated Provider Care Teams.  These Care Teams include your primary Cardiologist (physician) and Advanced Practice Providers (APPs -  Physician Assistants and  Nurse Practitioners) who all work together to provide you with the care you need, when you need it. . You will need a follow up appointment in    8  Months- MAY 2021 .  Please call our office 2 months in advance to schedule this appointment.  You may see Glenetta Hew, MD or one of the following Advanced Practice Providers on your designated Care Team:   . Rosaria Ferries, PA-C . Jory Sims, DNP, ANP  Any Other Special Instructions Will Be Listed Below (If Applicable).  Studies Ordered:   Orders Placed This Encounter  Procedures  . EKG 12-Lead      Glenetta Hew, M.D., M.S. Interventional Cardiologist   Pager # [phone] Phone # [phone] [address] [place], [place] [address]   Thank you for choosing Heartcare at [facility]!!

## 2018-12-16 ENCOUNTER — Encounter: Payer: Self-pay | Admitting: Cardiology

## 2018-12-16 NOTE — Assessment & Plan Note (Signed)
No anginal symptoms.  Has not really had any issues since his cath and PCI years ago.  Not on aspirin or Plavix because of Eliquis.  Not on beta-blocker because of fatigue, is on diltiazem which is acceptable with normal EF.  Is on statin and ARB.

## 2018-12-16 NOTE — Assessment & Plan Note (Signed)
Lipids as of January are well controlled.  LDL 60 on 20 of rosuvastatin with energy improving, we are not can adjust statin holiday.  No change.

## 2018-12-16 NOTE — Assessment & Plan Note (Signed)
Blood pressure is a little high today.  He tells me it is usually better than this.  With him having less dizziness allow for mild permissive hypertension and not titrate medications further.  On stable dose of Micardis and diltiazem.

## 2018-12-16 NOTE — Assessment & Plan Note (Addendum)
  Now on lower dose diltiazem, rate seems to be pretty well controlled.  No recurrent A. fib. On Eliquis with no bleeding.

## 2018-12-16 NOTE — Assessment & Plan Note (Signed)
Doing well on Eliquis.

## 2018-12-16 NOTE — Assessment & Plan Note (Signed)
With normal blood pressure and heart rate he seems to do fine.  He only becomes symptomatic if he goes in A. fib.  He is not on a diuretic.  Is on ARB and diltiazem

## 2019-01-03 ENCOUNTER — Ambulatory Visit (INDEPENDENT_AMBULATORY_CARE_PROVIDER_SITE_OTHER): Payer: Medicare Other | Admitting: Cardiovascular Disease

## 2019-01-03 ENCOUNTER — Telehealth: Payer: Self-pay | Admitting: *Deleted

## 2019-01-03 ENCOUNTER — Other Ambulatory Visit: Payer: Self-pay

## 2019-01-03 ENCOUNTER — Encounter: Payer: Self-pay | Admitting: Cardiovascular Disease

## 2019-01-03 DIAGNOSIS — Z9989 Dependence on other enabling machines and devices: Secondary | ICD-10-CM | POA: Diagnosis not present

## 2019-01-03 DIAGNOSIS — E669 Obesity, unspecified: Secondary | ICD-10-CM

## 2019-01-03 DIAGNOSIS — I10 Essential (primary) hypertension: Secondary | ICD-10-CM

## 2019-01-03 DIAGNOSIS — Z9861 Coronary angioplasty status: Secondary | ICD-10-CM | POA: Diagnosis not present

## 2019-01-03 DIAGNOSIS — E785 Hyperlipidemia, unspecified: Secondary | ICD-10-CM | POA: Diagnosis not present

## 2019-01-03 DIAGNOSIS — G4733 Obstructive sleep apnea (adult) (pediatric): Secondary | ICD-10-CM | POA: Diagnosis not present

## 2019-01-03 DIAGNOSIS — I2510 Atherosclerotic heart disease of native coronary artery without angina pectoris: Secondary | ICD-10-CM | POA: Diagnosis not present

## 2019-01-03 NOTE — Patient Instructions (Signed)
Medication Instructions:  The current medical regimen is effective;  continue present plan and medications.  If you need a refill on your cardiac medications before your next appointment, please call your pharmacy.    Testing/Procedures: Your physician has recommended that you have a sleep study. This test records several body functions during sleep, including: brain activity, eye movement, oxygen and carbon dioxide blood levels, heart rate and rhythm, breathing rate and rhythm, the flow of air through your mouth and nose, snoring, body muscle movements, and chest and belly movement.  Follow-Up: At [facility], you and your health needs are our priority.  As part of our continuing mission to provide you with exceptional heart care, we have created designated Provider Care Teams.  These Care Teams include your primary Cardiologist (physician) and Advanced Practice Providers (APPs -  Physician Assistants and Nurse Practitioners) who all work together to provide you with the care you need, when you need it. You will need a follow up appointment in 3 months(sleep).You may see Dr.Thomas Claiborne Billings or one of the following Advanced Practice Providers on your designated Care Team: Almyra Deforest, [place] . Fabian Sharp, PA-C

## 2019-01-03 NOTE — Telephone Encounter (Signed)
Patient notified of both Sleep study and [place] appointments.

## 2019-01-06 ENCOUNTER — Encounter: Payer: Self-pay | Admitting: Cardiovascular Disease

## 2019-01-06 NOTE — Progress Notes (Signed)
Cardiology Office Note    Date:  01/06/2019   ID:  [name], DOB 10-Sep-1935, MRN [id number]  PCP:  Lavone Orn, MD  Cardiologist:  Shelva Majestic, MD (sleep); Dr. Ellyn Hack  New sleep evaluation  History of Present Illness:  [name] is a 83 y.o. male who is a former patient of Dr.Gamble and now sees Dr. Ellyn Hack for his cardiology care.  He has a history of obstructive sleep apnea.  He is no longer on therapy and presents to reestablish care with me from a sleep perspective.  [name] underwent an initial sleep study in 2007 and was followed by Dr. Asencion Partridge Dohmeier.  AHI was 25.  Initially CPAP and later BiPAP was implemented but ultimately he was started on VPAP [employer] Servo Ventilation modality by Dr. Brett Fairy.  I had seen him on one occasion in 2014 at which time he was having difficulty with his machine with intermittent malfunction.  A download in that office visit from March 25, 2011 to June 14, 2012 showed a median peak inspiratory pressure 14.2 with 95th percentile 19.6, a medium end expiratory pressure of 10 with a 95th percentile pressure at 10; and a medium pressure of 12 with 95th percentile pressure of 13.  Target minute ventilation meeting was 6.7 with 95th percentile at 8.2.  Tidal volume medium was 442 mL's with 95th percentile at 759, and his minute ventilation median was 6.6 L/min with a 95th percentile at 11.1.  At that time he had a respiratory rate median of 16 with 95th percentile at 19.  Apparently, he ultimately stopped using therapy and gave his machine away.  He has a history of hypertension, CAD status post PCI in 2006, PAF, left-ventricular hypertrophy, and history prostate cancer and melanoma.  He was recently seen by Dr. Ellyn Hack.  The patient admitted to difficulty with sleep.  He has had nocturia at least 3 times per night, snoring and has daytime sleepiness.  He typically goes to bed around 11:30 PM and wakes up around 8 AM.  He has been requiring  frequent naps.  An Epworth Sleepiness Scale score was calculated in the office today and this endorsed at 23 and is consistent with severe excessive daytime sleepiness as shown below:  Epworth Sleepiness Scale: Situation   Chance of Dozing/Sleeping (0 = never , 1 = slight chance , 2 = moderate chance , 3 = high chance )   sitting and reading 3   watching TV 3   sitting inactive in a public place 2   being a passenger in a motor vehicle for an hour or more 3   lying down in the afternoon 3   sitting and talking to someone 3   sitting quietly after lunch (no alcohol) 3   while stopped for a few minutes in traffic as the driver 3   Total Score  23     He now presents for sleep evaluation.   Past Medical History:  Diagnosis Date   Arthritis    CAD S/P percutaneous coronary angioplasty 08/2004   CARDIOLOGIST-  DR DAVID HARDING; DES to LCx. X2  - Cypher 2.5 mm postdilated to 2.75 mm (20 mm and 13 mm stents);; Myoview 09/2014: LOW RISK. No Infarct or Ischemia.   Colon polyps 05/10/2005   Hyperplastic polyps   Complication of anesthesia    "takes long time to wake up"   Depression    Diabetes mellitus type 2 with complications ([place])  neuropathy; CAD borderline- no med   Diastolic dysfunction without heart failure    Moderate LVH, Gr 1 DD on Echo 2011; no CHF admissions, no tt on diuretisc   Essential hypertension    H/O: gout STABLE   History of melanoma excision 2010   FOREHEAD   History of prostate cancer 2004--  S/P SEED IMPLANTS    NO RECURRENCE   Hydrocele, left    Hyperlipidemia LDL goal <70    Impaired hearing RIGHT HEARING AID   OSA on CPAP    bipap   PAF (paroxysmal atrial fibrillation) ([place]) 12/11/2017   Admitted with Afib RVR - converted on IV Diltiazem. d/c on Eliquis; CHA2DS2Vasc = 5.   Prostate cancer [facility])    prostate , melanoma head   Shingles 07   Syncope and collapse March 2017   Likely related to post micturition, Neurontin and  dehydration with orthostatic hypotension   Urgency of urination     Past Surgical History:  Procedure Laterality Date   Cardiac Event Monitor  3-07/2015   Mostly SR - 58-132 bpm. Rare PVCs (occ bigeminy), Frequent PACs - singlets, couplets - short runs of PAT.   circumsision     CORONARY ANGIOPLASTY WITH STENT PLACEMENT  09-01-2004     Cypher DES x 2 d-m LCx 2.5 mm x 20 mm & 2.5 mm x 13 mm (~2.75 mm)   EXCISION MELANOMA FROM FOREHEAD  2010   EYE SURGERY Bilateral    cataracts   HYDROCELE EXCISION  10/14/2011   Procedure: HYDROCELECTOMY ADULT;  Surgeon: Ailene Rud, MD;  Location: [facility];  Service: Urology;  Laterality: Left;   LAMINECTOMY N/A 07/13/2012   Procedure: THORACIC LAMINECTOMY FOR RESECTION OF INTRAMEDULLARY SPINAL CHORD TUMOR WITH SPINAL CHORD MONITORING;  Surgeon: Erline Levine, MD;  Location: [facility];  Service: Neurosurgery;  Laterality: N/A;  Thoracic laminectomy for resection of intramedullary spinal cord tumor with spinal cord monitering and Dr. Christella Noa to assist   LAMINECTOMY N/A 12/09/2014   Procedure: Redo Thoracic laminectomy with intramedullary tumar resection/USN/[place]/Subarachnoid shunt/spinal cord monitoring/Dr. Kathyrn Sheriff to assist;  Surgeon: Erline Levine, MD;  Location: [facility];  Service: Neurosurgery;  Laterality: N/A;  Redo Thoracic laminectomy with intramedullary tumar resection/USN/[place]/Subarachnoid shunt/spinal cord monitoring/Dr. Kathyrn Sheriff to assist   NM MYOVIEW LTD  3/212014; June 2016   Both Lexiscan: a. LOW RISK, mild inferior bowel artifact; No ischemia or Infarction; b. Low risk, normal study. No infarction or ischemia. No artifact.   PROSTATE PALLADIUM GOLD SEED IMPLANTS (118)  11-15-2002   PROSTATE CANCER   SKIN BIOPSY     TONSILLECTOMY     TRANSTHORACIC ECHOCARDIOGRAM  12/2017   (A. fib): Moderate concentric LVH.  EF 55 to 60%.  No wall motion normality.  Mild to moderately dilated left atrium.     Current Medications: Outpatient Medications Prior to Visit  Medication Sig Dispense Refill   allopurinol (ZYLOPRIM) 300 MG tablet Take 1 tablet (300 mg total) by mouth daily. 30 tablet 1   apixaban (ELIQUIS) 5 MG TABS tablet Take 1 tablet (5 mg total) by mouth 2 (two) times daily. 180 tablet 3   diltiazem (CARDIZEM CD) 120 MG 24 hr capsule Take 1 capsule (120 mg total) by mouth daily as needed. 30 capsule 6   escitalopram (LEXAPRO) 10 MG tablet Take 1 tablet by mouth daily.     metFORMIN (GLUCOPHAGE) 500 MG tablet Take 1 tablet (500 mg total) by mouth 2 (two) times daily with a meal.  oxybutynin (DITROPAN XL) 15 MG 24 hr tablet Take 1 tablet (15 mg total) by mouth at bedtime. 30 tablet 1   rosuvastatin (CRESTOR) 20 MG tablet Take 1 tablet (20 mg total) by mouth daily. 30 tablet 6   tamsulosin (FLOMAX) 0.4 MG CAPS capsule Take 1 capsule (0.4 mg total) by mouth at bedtime. 30 capsule 1   telmisartan (MICARDIS) 40 MG tablet Take 1 tablet (40 mg total) by mouth daily. 30 tablet 6   VITAMIN D, CHOLECALCIFEROL, PO Take 1 tablet by mouth daily.      No facility-administered medications prior to visit.      Allergies:   Betadine [povidone iodine], Contrast media [iodinated diagnostic agents], Iodine, and Shellfish allergy   Social History   Socioeconomic History   Marital status: Married    Spouse name: Not on file   Number of children: Not on file   Years of education: Not on file   Highest education level: Not on file  Occupational History   Not on file  Social Needs   Financial resource strain: Not on file   Food insecurity    Worry: Not on file    Inability: Not on file   Transportation needs    Medical: Not on file    Non-medical: Not on file  Tobacco Use   Smoking status: Never Smoker   Smokeless tobacco: Never Used   Tobacco comment: occ alcohol  Substance and Sexual Activity   Alcohol use: Yes    Comment: OCCASIONAL   Drug use: No   Sexual  activity: Not on file  Lifestyle   Physical activity    Days per week: Not on file    Minutes per session: Not on file   Stress: Not on file  Relationships   Social connections    Talks on phone: Not on file    Gets together: Not on file    Attends religious service: Not on file    Active member of club or organization: Not on file    Attends meetings of clubs or organizations: Not on file    Relationship status: Not on file  Other Topics Concern   Not on file  Social History Narrative   He is married, father of 2, grandfather of 26. Does not really get exercise now because   of his back issues. Does not smoke and drinks social alcohol. Works in [employer].    He was born in the [place].  He has lived in the [place] area for the last 40 years. 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  He was notified that this will end.   Family History:  The patient's family history includes Hypertension in his brother, father, and mother; Prostate cancer in his brother and father; Stroke in his mother.   ROS General: Negative; No fevers, chills, or night sweats;  HEENT: Negative; No changes in vision or hearing, sinus congestion, difficulty swallowing Pulmonary: Negative; No cough, wheezing, shortness of breath, hemoptysis Cardiovascular: CAD status post PCI by Dr. Melvern Banker; history of PAF; no recent chest pain, presyncope, syncope, palpitations GI: Negative; No nausea, vomiting, diarrhea, or abdominal pain GU: History of prostate CA Musculoskeletal: Negative; no myalgias, joint pain, or weakness Hematologic/Oncology: Negative; no easy bruising, bleeding Endocrine: Negative; no heat/cold intolerance; no diabetes Neuro: Negative; no changes in balance, headaches Skin: History of melanoma Psychiatric: Negative; No behavioral problems, depression Sleep: See HPI ; positive for snoring, daytime sleepiness, hypersomnolence; no bruxism, restless legs,  hypnogognic  hallucinations, no cataplexy Other comprehensive 14 point system review is negative.   PHYSICAL EXAM:   VS:  BP (!) 154/86    Pulse 79    Temp (!) 97.2 F (36.2 C)    Ht 5' 9.5" (1.765 m)    Wt 200 lb (90.7 kg)    SpO2 99%    BMI 29.11 kg/m     Repeat blood pressure by me remained elevated at 158/84  Wt Readings from Last 3 Encounters:  01/03/19 200 lb (90.7 kg)  12/14/18 202 lb (91.6 kg)  08/08/18 201 lb (91.2 kg)    General: Alert, oriented, no distress.  Skin: normal turgor, no rashes, warm and dry HEENT: Normocephalic, atraumatic. Pupils equal round and reactive to light; sclera anicteric; extraocular muscles intact;  Nose without nasal septal hypertrophy Mouth/Parynx benign; Mallinpatti scale 3/4 Neck: No JVD, no carotid bruits; normal carotid upstroke Lungs: clear to ausculatation and percussion; no wheezing or rales Chest wall: without tenderness to palpitation Heart: PMI not displaced, RRR, s1 s2 normal, 1/6 systolic murmur, no diastolic murmur, no rubs, gallops, thrills, or heaves Abdomen: soft, nontender; no hepatosplenomehaly, BS+; abdominal aorta nontender and not dilated by palpation. Back: no CVA tenderness Pulses 2+ Musculoskeletal: full range of motion, normal strength, no joint deformities Extremities: no clubbing cyanosis or edema, Homan's sign negative  Neurologic: grossly nonfocal; Cranial nerves grossly wnl Psychologic: Normal mood and affect   Studies/Labs Reviewed:   EKG:  EKG is not ordered today.    I personally reviewed the ECG of December 14, 2018 which shows normal sinus rhythm at 69 bpm.  PR interval at 200.  No ectopy.  Recent Labs: BMP Latest Ref Rng & Units 12/11/2017 12/11/2017 06/18/2015  Glucose 70 - 99 mg/dL 129(H) 138(H) 139(H)  BUN 8 - 23 mg/dL 20 20 25(H)  Creatinine 0.61 - 1.24 mg/dL 1.27(H) 1.39(H) 1.45(H)  Sodium 135 - 145 mmol/L 141 139 140  Potassium 3.5 - 5.1 mmol/L 4.1 4.2 3.8  Chloride 98 - 111 mmol/L 105 104 104  CO2 22 - 32  mmol/L _0 Calcium 8.9 - 10.3 mg/dL 8.7(L) 8.5(L) 8.7(L)     Hepatic Function Latest Ref Rng & Units 12/11/2017 06/18/2015 12/15/2014  Total Protein 6.5 - 8.1 g/dL 5.9(L) 5.8(L) 7.2  Albumin 3.5 - 5.0 g/dL 3.5 3.2(L) 3.6  AST 15 - 41 U/L _1 ALT 0 - 44 U/L _2 Alk Phosphatase 38 - 126 U/L 46 37(L) 81  Total Bilirubin 0.3 - 1.2 mg/dL 0.5 0.4 0.3    CBC Latest Ref Rng & Units 03/21/2018 12/12/2017 12/11/2017  WBC 3.4 - 10.8 x10E3/uL 9.4 8.8 10.8(H)  Hemoglobin 13.0 - 17.7 g/dL 14.3 13.1 14.1  Hematocrit 37.5 - 51.0 % 42.7 40.3 43.2  Platelets 150 - 450 x10E3/uL 229 172 189   Lab Results  Component Value Date   MCV 87 03/21/2018   MCV 88.6 12/12/2017   MCV 90.2 12/11/2017   Lab Results  Component Value Date   TSH 1.834 12/11/2017   Lab Results  Component Value Date   HGBA1C 6.9 (H) 12/01/2014     BNP    Component Value Date/Time   BNP 414.6 (H) 12/11/2017 1400    ProBNP No results found for: PROBNP   Lipid Panel     Component Value Date/Time   CHOL 112 04/16/2013 0959   CHOL 145 10/02/2012 1002   TRIG 143 04/16/2013 0959   TRIG 157 (H) 10/02/2012  1002   HDL 31 (L) 04/16/2013 0959   HDL 36 (L) 10/02/2012 1002   CHOLHDL 3.6 04/16/2013 0959   VLDL 29 04/16/2013 0959   LDLCALC 52 04/16/2013 0959   LDLCALC 78 10/02/2012 1002     RADIOLOGY: No results found.   Additional studies/ records that were reviewed today include:  I reviewed the patient's prior sleep study from 2007, subsequent [facility] office notes, records of Dr. Ellyn Hack as well as recent laboratory.   ASSESSMENT:    1. OSA    2. CAD S/P percutaneous coronary angioplasty: Cypher DES x2 to circumflex   3. Hyperlipidemia LDL goal <70   4. Essential hypertension   5. Obesity (BMI 30-39.9)     PLAN:  [name] is an 83 year old gentleman who has cardiovascular comorbidities including CAD, status post remote PCI to his circumflex vessel in  2006, history of PAF, as well as a history of prostate CA, melanoma, and has recently been followed by Dr. Ellyn Hack for his care.  He has a history of documented sleep apnea which initially was diagnosed in 2007.  AHI was 25.  He failed CPAP and BiPAP as result of continued events as well as central events and ultimately underwent VPAP ASV titration.  Apparently he was on ASV therapy for several years but ultimately discontinued treatment and no longer has a machine for the last several years.  Presently, he is symptomatic with snoring, frequent awakenings, nocturia, and has significant excessive daytime sleepiness with an Epworth Sleepiness Scale score endorsed at 23 today.  I again reviewed potential adverse consequences of untreated sleep apnea on affecting normal sleep architecture as well as its cardiovascular ramifications.  After much discussion, the patient is in agreement to pursue another evaluation.  As result I will schedule him for a split-night protocol which needs to be an in lab study.  His echo Doppler study in September 2019 showed an EF of 55 to 60%.  If he develops significant central apneic events he will most likely need reinstitution of ASV therapy and additional titration necessary.  His blood pressure today was elevated and he states his dose of diltiazem was just recently increased from 120 to 180 mg.  I discussed with him target blood pressure less than 130/80.  He continues to be on telmisartan 40 mg.  He will follow-up with Dr. Ellyn Hack and further medication titration may be necessary.  He is diabetic on metformin.  He continues to take rosuvastatin for hyperlipidemia with target LDL less than 70.  He is anticoagulated on apixaban.  There is no bleeding.  I will see him in 3 months for follow-up evaluation.   Medication Adjustments/Labs and Tests Ordered: Current medicines are reviewed at length with the patient today.  Concerns regarding medicines are outlined above.  Medication  changes, Labs and Tests ordered today are listed in the Patient Instructions below. Patient Instructions  Medication Instructions:  The current medical regimen is effective;  continue present plan and medications.  If you need a refill on your cardiac medications before your next appointment, please call your pharmacy.    Testing/Procedures: Your physician has recommended that you have a sleep study. This test records several body functions during sleep, including: brain activity, eye movement, oxygen and carbon dioxide blood levels, heart rate and rhythm, breathing rate and rhythm, the flow of air through your mouth and nose, snoring, body muscle movements, and chest and belly movement.  Follow-Up: At [facility], you and  your health needs are our priority.  As part of our continuing mission to provide you with exceptional heart care, we have created designated Provider Care Teams.  These Care Teams include your primary Cardiologist (physician) and Advanced Practice Providers (APPs -  Physician Assistants and Nurse Practitioners) who all work together to provide you with the care you need, when you need it. You will need a follow up appointment in 3 months(sleep).You may see Dr.Vardaan Depascale Claiborne Billings or one of the following Advanced Practice Providers on your designated Care Team: Almyra Deforest, PA-C  Fabian Sharp, [place]        Signed, Shelva Majestic, MD  01/06/2019 9:58 AM    [facility] [address] [place], [place], [place]  [address] Phone: [phone]

## 2019-01-16 ENCOUNTER — Other Ambulatory Visit (HOSPITAL_COMMUNITY)
Admission: RE | Admit: 2019-01-16 | Discharge: 2019-01-16 | Disposition: A | Discharge status: Home / Self Care | Payer: Medicare Other | Source: Ambulatory Visit | Attending: Cardiovascular Disease | Admitting: Cardiovascular Disease

## 2019-01-16 DIAGNOSIS — Z20828 Contact with and (suspected) exposure to other viral communicable diseases: Secondary | ICD-10-CM | POA: Insufficient documentation

## 2019-01-16 LAB — SARS CORONAVIRUS 2 (TAT 6-24 HRS): SARS Coronavirus 2: NEGATIVE

## 2019-01-19 ENCOUNTER — Other Ambulatory Visit: Payer: Self-pay

## 2019-01-19 ENCOUNTER — Ambulatory Visit (HOSPITAL_BASED_OUTPATIENT_CLINIC_OR_DEPARTMENT_OTHER): Payer: Medicare Other | Attending: Cardiovascular Disease | Admitting: Cardiovascular Disease

## 2019-01-19 DIAGNOSIS — G4733 Obstructive sleep apnea (adult) (pediatric): Secondary | ICD-10-CM | POA: Diagnosis not present

## 2019-01-19 DIAGNOSIS — Z9989 Dependence on other enabling machines and devices: Secondary | ICD-10-CM | POA: Diagnosis not present

## 2019-01-21 ENCOUNTER — Other Ambulatory Visit: Payer: Self-pay

## 2019-01-24 ENCOUNTER — Encounter (HOSPITAL_BASED_OUTPATIENT_CLINIC_OR_DEPARTMENT_OTHER): Payer: Self-pay | Admitting: Cardiovascular Disease

## 2019-01-24 NOTE — Procedures (Signed)
Patient Name: [name], [name] Date: 01/19/2019 Gender: Male D.O.B: Jan 16, 1936 Age (years): 49 Referring Provider: Shelva Majestic MD, ABSM Height (inches): 69 Interpreting Physician: Shelva Majestic MD, ABSM Weight (lbs): 199 RPSGT: [place], [place] BMI: 29 MRN: [id number] Neck Size: 17.25  CLINICAL INFORMATION Sleep Study Type: NPSG  Indication for sleep study: Hypertension, OSA with previous use of BiPAP  Epworth Sleepiness Score: 14  SLEEP STUDY TECHNIQUE As per the AASM Manual for the Scoring of Sleep and Associated Events v2.3 (April 2016) with a hypopnea requiring 4% desaturations.  The channels recorded and monitored were frontal, central and occipital EEG, electrooculogram (EOG), submentalis EMG (chin), nasal and oral airflow, thoracic and abdominal wall motion, anterior tibialis EMG, snore microphone, electrocardiogram, and pulse oximetry.  MEDICATIONS     allopurinol (ZYLOPRIM) 300 MG tablet         apixaban (ELIQUIS) 5 MG TABS tablet         diltiazem (CARDIZEM CD) 120 MG 24 hr capsule (Expired)         escitalopram (LEXAPRO) 10 MG tablet         metFORMIN (GLUCOPHAGE) 500 MG tablet         oxybutynin (DITROPAN XL) 15 MG 24 hr tablet         rosuvastatin (CRESTOR) 20 MG tablet         tamsulosin (FLOMAX) 0.4 MG CAPS capsule         telmisartan (MICARDIS) 40 MG tablet         VITAMIN D, CHOLECALCIFEROL, PO      Medications self-administered by patient taken the night of the study : N/A  SLEEP ARCHITECTURE The study was initiated at 9:49:43 PM and ended at 4:33:30 AM.  Sleep onset time was 165.3 minutes and the sleep efficiency was 27.9%%. The total sleep time was 112.5 minutes.  Stage REM latency was N/A minutes.  The patient spent 53.8%% of the night in stage N1 sleep, 46.2%% in stage N2 sleep, 0.0%% in stage N3 and 0% in REM.  Alpha intrusion was absent.  Supine sleep was 43.29%.  RESPIRATORY PARAMETERS The overall apnea/hypopnea index  (AHI) was 41.1 per hour. The respiratory disturbance index (RDI) was 43.2/h.There were 24 total apneas, including 24 obstructive, 0 central and 0 mixed apneas. There were 53 hypopneas and 4 RERAs.  The AHI during Stage REM sleep was N/A per hour.  AHI while supine was 55.4 per hour.  The mean oxygen saturation was 92.9%. The minimum SpO2 during sleep was 83.0%.  Moderate snoring was noted during this study.  CARDIAC DATA The 2 lead EKG demonstrated sinus rhythm. The mean heart rate was 59.6 beats per minute. Other EKG findings include: PVCs.  LEG MOVEMENT DATA The total PLMS were 0 with a resulting PLMS index of 0.0. Associated arousal with leg movement index was 8.0.  IMPRESSIONS - Severe obstructive sleep apnea  (AHI 41.1/h; RDI 43.2/h). Events were worse with supine sleep (AHI 55.4/h). REM sleep did not occur during this study. - No significant central sleep apnea occurred during this study (CAI = 0.0/h). - Mild oxygen desaturation to a nadir of 83.0%. - Abnormal sleep archecture with absence of slow wave and REM sleep.  - Reduced sleep efficiency at only 27.9%. - The patient snored with moderate snoring volume. - EKG findings include PVCs and PACs - Clinically significant periodic limb movements did not occur during sleep. Associated arousals were significant.  DIAGNOSIS - Obstructive Sleep Apnea (327.23 [G47.33 ICD-10]) -  Periodic Limb Movement During Sleep (327.51 [G47.61 ICD-10]) - Nocturnal Hypoxemia (327.26 [G47.36 ICD-10])  RECOMMENDATIONS - In this patient with severe sleep apnea, recommend a therapeutic in-lab CPAP titration to determine optimal pressure required to alleviate sleep disordered breathing. - Effort sjhould be made to optimize nasal and oropharyngeal patency. - Positional therapy avoiding supine position during sleep. - Avoid alcohol, sedatives and other CNS depressants that may worsen sleep apnea and disrupt normal sleep architecture. - Sleep hygiene  should be reviewed to assess factors that may improve sleep quality. - Weight management and regular exercise should be initiated or continued if appropriate.  [Electronically signed] 01/24/2019 06:08 PM  Shelva Majestic MD, [facility], ABSM Diplomate, American Board of Sleep Medicine   NPI: [id number] [place] PH: [phone]   FX: [phone] [place] [employer]

## 2019-01-30 ENCOUNTER — Telehealth: Payer: Self-pay | Admitting: *Deleted

## 2019-01-30 ENCOUNTER — Other Ambulatory Visit: Payer: Self-pay | Admitting: Cardiovascular Disease

## 2019-01-30 DIAGNOSIS — G4733 Obstructive sleep apnea (adult) (pediatric): Secondary | ICD-10-CM

## 2019-01-30 NOTE — Telephone Encounter (Signed)
Patient notified of sleep study results and recommendations. He agrees to having CPAP titration study. Appointment details given to patient.

## 2019-02-01 DIAGNOSIS — I10 Essential (primary) hypertension: Secondary | ICD-10-CM | POA: Diagnosis not present

## 2019-02-01 DIAGNOSIS — F325 Major depressive disorder, single episode, in full remission: Secondary | ICD-10-CM | POA: Diagnosis not present

## 2019-02-01 DIAGNOSIS — E1122 Type 2 diabetes mellitus with diabetic chronic kidney disease: Secondary | ICD-10-CM | POA: Diagnosis not present

## 2019-02-01 DIAGNOSIS — G4733 Obstructive sleep apnea (adult) (pediatric): Secondary | ICD-10-CM | POA: Diagnosis not present

## 2019-02-01 DIAGNOSIS — N182 Chronic kidney disease, stage 2 (mild): Secondary | ICD-10-CM | POA: Diagnosis not present

## 2019-02-01 DIAGNOSIS — I2510 Atherosclerotic heart disease of native coronary artery without angina pectoris: Secondary | ICD-10-CM | POA: Diagnosis not present

## 2019-02-12 ENCOUNTER — Other Ambulatory Visit (HOSPITAL_COMMUNITY): Payer: Medicare Other

## 2019-02-14 ENCOUNTER — Other Ambulatory Visit: Payer: Self-pay

## 2019-02-14 ENCOUNTER — Encounter (HOSPITAL_BASED_OUTPATIENT_CLINIC_OR_DEPARTMENT_OTHER): Payer: Medicare Other | Admitting: Cardiovascular Disease

## 2019-02-14 NOTE — Patient Outreach (Signed)
[facility] Prevent call. Patient referred to [facility].

## 2019-02-19 ENCOUNTER — Telehealth: Payer: Self-pay | Admitting: *Deleted

## 2019-02-19 NOTE — Telephone Encounter (Signed)
Patient called to say that he still has not gotten a call from Dr Claiborne Billings about maybe getting a CPAP auto machine  That his PCP said he could do). He cancelled the titration study that was scheduled. I explained to him that due to the severity of his OSA a auto machine is not recommended. We welcome his PCP's opinions however Dr Claiborne Billings is board certified in sleep medicine and understands better what he needs to treat his OSA. He agrees and has decided to call Terrie @ the sleep lab and reschedule the titration study.

## 2019-03-06 ENCOUNTER — Other Ambulatory Visit (HOSPITAL_COMMUNITY)
Admission: RE | Admit: 2019-03-06 | Discharge: 2019-03-06 | Disposition: A | Discharge status: Home / Self Care | Payer: Medicare Other | Source: Ambulatory Visit | Attending: Cardiovascular Disease | Admitting: Cardiovascular Disease

## 2019-03-06 DIAGNOSIS — Z01812 Encounter for preprocedural laboratory examination: Secondary | ICD-10-CM | POA: Diagnosis not present

## 2019-03-06 DIAGNOSIS — Z20828 Contact with and (suspected) exposure to other viral communicable diseases: Secondary | ICD-10-CM | POA: Insufficient documentation

## 2019-03-06 LAB — SARS CORONAVIRUS 2 (TAT 6-24 HRS): SARS Coronavirus 2: NEGATIVE

## 2019-03-08 ENCOUNTER — Ambulatory Visit (HOSPITAL_BASED_OUTPATIENT_CLINIC_OR_DEPARTMENT_OTHER): Payer: Medicare Other | Attending: Cardiovascular Disease | Admitting: Cardiovascular Disease

## 2019-03-08 ENCOUNTER — Other Ambulatory Visit: Payer: Self-pay

## 2019-03-08 DIAGNOSIS — Z7901 Long term (current) use of anticoagulants: Secondary | ICD-10-CM | POA: Diagnosis not present

## 2019-03-08 DIAGNOSIS — Z7984 Long term (current) use of oral hypoglycemic drugs: Secondary | ICD-10-CM | POA: Insufficient documentation

## 2019-03-08 DIAGNOSIS — G4733 Obstructive sleep apnea (adult) (pediatric): Secondary | ICD-10-CM | POA: Insufficient documentation

## 2019-03-08 DIAGNOSIS — Z79899 Other long term (current) drug therapy: Secondary | ICD-10-CM | POA: Diagnosis not present

## 2019-03-08 DIAGNOSIS — G4761 Periodic limb movement disorder: Secondary | ICD-10-CM | POA: Diagnosis not present

## 2019-03-08 DIAGNOSIS — I493 Ventricular premature depolarization: Secondary | ICD-10-CM | POA: Insufficient documentation

## 2019-03-11 ENCOUNTER — Other Ambulatory Visit: Payer: Self-pay

## 2019-03-19 ENCOUNTER — Encounter (HOSPITAL_BASED_OUTPATIENT_CLINIC_OR_DEPARTMENT_OTHER): Payer: Self-pay | Admitting: Cardiovascular Disease

## 2019-03-19 NOTE — Procedures (Signed)
Patient Name: [name], [name] Date: 03/08/2019 Gender: Male D.O.B: 09/02/1935 Age (years): 23 Referring Provider: Shelva Majestic MD, ABSM Height (inches): 70 Interpreting Physician: Shelva Majestic MD, ABSM Weight (lbs): 200 RPSGT: Zadie Rhine BMI: 29 MRN: [id number] Neck Size: 17.50  CLINICAL INFORMATION The patient is referred for a split night study with BPAP.  Polysomnogram dated 01/19/2019 revealed an AHI of 41.1/h and RDI of 43.2/h; supine sleep AHI 55.4/h; O2 nadir 83%; with absence of REM sleep.  MEDICATIONS allopurinol (ZYLOPRIM) 300 MG tablet apixaban (ELIQUIS) 5 MG TABS tablet diltiazem (CARDIZEM CD) 120 MG 24 hr capsule(Expired) escitalopram (LEXAPRO) 10 MG tablet metFORMIN (GLUCOPHAGE) 500 MG tablet oxybutynin (DITROPAN XL) 15 MG 24 hr tablet rosuvastatin (CRESTOR) 20 MG tablet tamsulosin (FLOMAX) 0.4 MG CAPS capsule telmisartan (MICARDIS) 40 MG tablet VITAMIN D, CHOLECALCIFEROL, PO   Medications self-administered by patient taken the night of the study : N/A  SLEEP STUDY TECHNIQUE As per the AASM Manual for the Scoring of Sleep and Associated Events v2.3 (April 2016) with a hypopnea requiring 4% desaturations.  The channels recorded and monitored were frontal, central and occipital EEG, electrooculogram (EOG), submentalis EMG (chin), nasal and oral airflow, thoracic and abdominal wall motion, anterior tibialis EMG, snore microphone, electrocardiogram, and pulse oximetry. Bi-level positive airway pressure (BiPAP) was initiated when the patient met split night criteria and was titrated according to treat sleep-disordered breathing.  RESPIRATORY PARAMETERS Diagnostic  Total AHI (/hr): 12.5 RDI (/hr): 15.1 OA Index (/hr): 2.4 CA Index (/hr): 1.7 REM AHI (/hr): 0.0 NREM AHI (/hr): 14.7 Supine AHI (/hr): 14.5 Non-supine AHI (/hr): 15.27 Min O2 Sat (%): 85.0 Mean O2 (%): 94.2 Time below 88% (min): 1.8   Titration  Optimal IPAP Pressure  (cm): 20 Optimal EPAP Pressure (cm): 16 AHI at Optimal Pressure (/hr): 0.0 Min O2 at Optimal Pressure (%): 92.0 Sleep % at Optimal (%): 99 Supine % at Optimal (%): 50   SLEEP ARCHITECTURE The study was initiated at 9:52:21 PM and terminated at 5:13:38 AM. The total recorded time was 441.3 minutes. EEG confirmed total sleep time was 378.5 minutes yielding a sleep efficiency of 85.8%%. Sleep onset after lights out was 40.1 minutes with a REM latency of 40.5 minutes. The patient spent 3.7%% of the night in stage N1 sleep, 81.2%% in stage N2 sleep, 0.0%% in stage N3 and 15.1% in REM. Wake after sleep onset (WASO) was 22.7 minutes. The Arousal Index was 33.4/hour.  LEG MOVEMENT DATA The total Periodic Limb Movements of Sleep (PLMS) were 0. The PLMS index was 0.0 .  CARDIAC DATA The 2 lead EKG demonstrated sinus rhythm. The mean heart rate was 100.0 beats per minute. Other EKG findings include: PVCs.  IMPRESSIONS - CPAP was initiated at 6 cm and was titrated to 16, due to continued events BiPAP was initiated at 18/14 and was titrated to optimal BiPAP pressure of 20/16 cm of water. AHI at 20/16 was 0 with O 2 nadir at 92%. - No significant central sleep apnea occurred during the diagnostic portion of the study (CAI = 1.7/hour). - No snoring was audible during this study. - EKG findings include PVCs. - Clinically significant periodic limb movements of sleep did not occur during the study.  DIAGNOSIS - Obstructive Sleep Apnea (327.23 [G47.33 ICD-10]) - Periodic Limb Movement During Sleep (327.51 [G47.61 ICD-10])  RECOMMENDATIONS - Recommend an initial trial of BiPAP therapy at 20/16 cm H2O with heated humidification. A Large size Fisher&Paykel Full Face Mask Simplus mask was used for  the titration. - Effort should be made to optimize nasal and oropharyngeal patency. - Avoid alcohol, sedatives and other CNS depressants that may worsen sleep apnea and disrupt normal sleep architecture. - Sleep  hygiene should be reviewed to assess factors that may improve sleep quality. - Weight management and regular exercise should be initiated or continued. - Recommend a download in 30 days and sleep clinic evaluation after 4 weeks of therapy.  [Electronically signed] 03/19/2019 10:10 PM  Shelva Majestic MD, [facility], [place], American Board of Sleep Medicine   NPI: [id number] [place] PH: [phone]   FX: [phone] [place]

## 2019-03-22 ENCOUNTER — Telehealth: Payer: Self-pay | Admitting: *Deleted

## 2019-03-22 NOTE — Telephone Encounter (Signed)
Patient notified sleep study has been completed. Orders for a BIPAP machine will be sent to [facility].

## 2019-04-08 DIAGNOSIS — H04123 Dry eye syndrome of bilateral lacrimal glands: Secondary | ICD-10-CM | POA: Diagnosis not present

## 2019-04-12 ENCOUNTER — Other Ambulatory Visit: Payer: Self-pay | Admitting: *Deleted

## 2019-04-12 NOTE — Patient Outreach (Signed)
[facility]) Care Management  04/12/2019  [name] 12-08-1935 [id number]   RN Health Coach telephone call to patient.  [employer] compliance verified. Outreach successful: After reviewing patient health history for serveral moments Patient declined services. Patient A1C is 6.1. Patient fasting blood sugar was 124. Patient feels that he is knowledgeable about care. Patient feels he would not benefit from this program at this time. Patient has agreed to receiving [facility] pampllet.  Plan: RN will send [place] [facility] [phone]

## 2019-04-17 ENCOUNTER — Ambulatory Visit: Payer: Medicare Other | Attending: Internal Medicine

## 2019-04-17 DIAGNOSIS — Z23 Encounter for immunization: Secondary | ICD-10-CM | POA: Insufficient documentation

## 2019-04-17 NOTE — Progress Notes (Signed)
   Covid-19 Vaccination Clinic  Name:  OVA MEEGAN    MRN: [id number] DOB: 05-23-1935  04/17/2019  Mr. Gwinner was observed post Covid-19 immunization for 30 minutes based on pre-vaccination screening without incidence. He was provided with Vaccine Information Sheet and instruction to access the V-Safe system.   Mr. Waddington was instructed to call 911 with any severe reactions post vaccine: Marland Kitchen Difficulty breathing  . Swelling of your face and throat  . A fast heartbeat  . A bad rash all over your body  . Dizziness and weakness    Immunizations Administered    Name Date Dose VIS Date Route   Pfizer COVID-19 Vaccine 04/17/2019 12:28 PM 0.3 mL 03/15/2019 Intramuscular   Manufacturer: [place]   Lot: [id number]   [place]: [id number]

## 2019-05-07 DIAGNOSIS — N182 Chronic kidney disease, stage 2 (mild): Secondary | ICD-10-CM | POA: Diagnosis not present

## 2019-05-07 DIAGNOSIS — Z0000 Encounter for general adult medical examination without abnormal findings: Secondary | ICD-10-CM | POA: Diagnosis not present

## 2019-05-07 DIAGNOSIS — I2510 Atherosclerotic heart disease of native coronary artery without angina pectoris: Secondary | ICD-10-CM | POA: Diagnosis not present

## 2019-05-07 DIAGNOSIS — G4733 Obstructive sleep apnea (adult) (pediatric): Secondary | ICD-10-CM | POA: Diagnosis not present

## 2019-05-07 DIAGNOSIS — Z1389 Encounter for screening for other disorder: Secondary | ICD-10-CM | POA: Diagnosis not present

## 2019-05-07 DIAGNOSIS — E782 Mixed hyperlipidemia: Secondary | ICD-10-CM | POA: Diagnosis not present

## 2019-05-07 DIAGNOSIS — F325 Major depressive disorder, single episode, in full remission: Secondary | ICD-10-CM | POA: Diagnosis not present

## 2019-05-07 DIAGNOSIS — Z85820 Personal history of malignant melanoma of skin: Secondary | ICD-10-CM | POA: Diagnosis not present

## 2019-05-07 DIAGNOSIS — Z7984 Long term (current) use of oral hypoglycemic drugs: Secondary | ICD-10-CM | POA: Diagnosis not present

## 2019-05-07 DIAGNOSIS — E1122 Type 2 diabetes mellitus with diabetic chronic kidney disease: Secondary | ICD-10-CM | POA: Diagnosis not present

## 2019-05-07 DIAGNOSIS — M109 Gout, unspecified: Secondary | ICD-10-CM | POA: Diagnosis not present

## 2019-05-07 DIAGNOSIS — I480 Paroxysmal atrial fibrillation: Secondary | ICD-10-CM | POA: Diagnosis not present

## 2019-05-07 DIAGNOSIS — I10 Essential (primary) hypertension: Secondary | ICD-10-CM | POA: Diagnosis not present

## 2019-05-08 ENCOUNTER — Ambulatory Visit: Payer: Medicare Other | Attending: Internal Medicine

## 2019-05-08 DIAGNOSIS — Z23 Encounter for immunization: Secondary | ICD-10-CM | POA: Insufficient documentation

## 2019-05-08 NOTE — Progress Notes (Signed)
   Covid-19 Vaccination Clinic  Name:  SALIOU BARNIER    MRN: [id number] DOB: Jul 03, 1935  05/08/2019  Mr. Dilks was observed post Covid-19 immunization for 15 minutes without incidence. He was provided with Vaccine Information Sheet and instruction to access the V-Safe system.   Mr. Wah was instructed to call 911 with any severe reactions post vaccine: Marland Kitchen Difficulty breathing  . Swelling of your face and throat  . A fast heartbeat  . A bad rash all over your body  . Dizziness and weakness    Immunizations Administered    Name Date Dose VIS Date Route   Pfizer COVID-19 Vaccine 05/08/2019  8:15 AM 0.3 mL 03/15/2019 Intramuscular   Manufacturer: [place]   Lot: [id number]   [place]: [id number]

## 2019-06-05 DIAGNOSIS — I10 Essential (primary) hypertension: Secondary | ICD-10-CM | POA: Diagnosis not present

## 2019-06-10 DIAGNOSIS — L304 Erythema intertrigo: Secondary | ICD-10-CM | POA: Diagnosis not present

## 2019-06-10 DIAGNOSIS — D225 Melanocytic nevi of trunk: Secondary | ICD-10-CM | POA: Diagnosis not present

## 2019-06-10 DIAGNOSIS — L814 Other melanin hyperpigmentation: Secondary | ICD-10-CM | POA: Diagnosis not present

## 2019-06-10 DIAGNOSIS — L570 Actinic keratosis: Secondary | ICD-10-CM | POA: Diagnosis not present

## 2019-06-10 DIAGNOSIS — L738 Other specified follicular disorders: Secondary | ICD-10-CM | POA: Diagnosis not present

## 2019-06-10 DIAGNOSIS — Z85820 Personal history of malignant melanoma of skin: Secondary | ICD-10-CM | POA: Diagnosis not present

## 2019-06-10 DIAGNOSIS — D485 Neoplasm of uncertain behavior of skin: Secondary | ICD-10-CM | POA: Diagnosis not present

## 2019-06-10 DIAGNOSIS — L821 Other seborrheic keratosis: Secondary | ICD-10-CM | POA: Diagnosis not present

## 2019-06-20 ENCOUNTER — Encounter: Payer: Self-pay | Admitting: Cardiovascular Disease

## 2019-06-20 ENCOUNTER — Other Ambulatory Visit: Payer: Self-pay

## 2019-06-20 ENCOUNTER — Ambulatory Visit (INDEPENDENT_AMBULATORY_CARE_PROVIDER_SITE_OTHER): Payer: Medicare Other | Admitting: Cardiovascular Disease

## 2019-06-20 VITALS — BP 142/70 | HR 79 | Ht 70.0 in | Wt 203.0 lb

## 2019-06-20 DIAGNOSIS — I10 Essential (primary) hypertension: Secondary | ICD-10-CM

## 2019-06-20 DIAGNOSIS — Z7901 Long term (current) use of anticoagulants: Secondary | ICD-10-CM

## 2019-06-20 DIAGNOSIS — Z9861 Coronary angioplasty status: Secondary | ICD-10-CM

## 2019-06-20 DIAGNOSIS — I480 Paroxysmal atrial fibrillation: Secondary | ICD-10-CM | POA: Diagnosis not present

## 2019-06-20 DIAGNOSIS — I2510 Atherosclerotic heart disease of native coronary artery without angina pectoris: Secondary | ICD-10-CM

## 2019-06-20 DIAGNOSIS — G4733 Obstructive sleep apnea (adult) (pediatric): Secondary | ICD-10-CM | POA: Diagnosis not present

## 2019-06-20 DIAGNOSIS — G4719 Other hypersomnia: Secondary | ICD-10-CM | POA: Diagnosis not present

## 2019-06-20 DIAGNOSIS — E785 Hyperlipidemia, unspecified: Secondary | ICD-10-CM | POA: Diagnosis not present

## 2019-06-20 NOTE — Patient Instructions (Signed)

## 2019-06-22 ENCOUNTER — Encounter: Payer: Self-pay | Admitting: Cardiovascular Disease

## 2019-06-22 NOTE — Progress Notes (Signed)
Cardiology Office Note    Date:  06/22/2019   ID:  [name], DOB 1935-04-20, MRN [id number]  PCP:  Charles Orn, MD  Cardiologist:  Charles Majestic, MD (sleep); Dr. Ellyn [name]  5 month F/U sleep evaluation  History of Present Illness:  [name] is a 84 y.o. male who is a former patient of CharlesGamble and now sees Dr. Ellyn [name] for his cardiology care.  He has a history of obstructive sleep apnea.  He was no longer on therapy and presented to reestablish care on January 03, 2019.  He presents now for follow-up evaluation following reinstitution of BiPAP.  [name] underwent an initial sleep study in 2007 and was followed by Dr. Asencion Partridge [name].  AHI was 25.  Initially CPAP and later BiPAP was implemented but ultimately he was started on VPAP [employer] Ventilation modality by Dr. Brett [name].  I had seen him on one occasion in 2014 at which time he was having difficulty with his machine with intermittent malfunction.  A download in that office visit from March 25, 2011 to June 14, 2012 showed a median peak inspiratory pressure 14.2 with 95th percentile 19.6, a medium end expiratory pressure of 10 with a 95th percentile pressure at 10; and a medium pressure of 12 with 95th percentile pressure of 13.  Target minute ventilation meeting was 6.7 with 95th percentile at 8.2.  Tidal volume medium was 442 mL's with 95th percentile at 759, and his minute ventilation median was 6.6 L/min with a 95th percentile at 11.1.  At that time he had a respiratory rate median of 16 with 95th percentile at 19.  Apparently, he ultimately stopped using therapy and gave his machine away.  He has a history of hypertension, CAD status post PCI in 2006, PAF, left-ventricular hypertrophy, and history prostate cancer and melanoma.  He was  seen by Dr. Ellyn [name] and admitted to difficulty with sleep.  He has had nocturia at least 3 times per night, snoring and has daytime sleepiness.  He typically goes to bed around  11:30 PM and wakes up around 8 AM.  He has been requiring frequent naps.  An Epworth Sleepiness Scale score was calculated in the office today and this endorsed at 23 and is consistent with severe excessive daytime sleepiness as shown below:  Epworth Sleepiness Scale: Situation   Chance of Dozing/Sleeping (0 = never , 1 = slight chance , 2 = moderate chance , 3 = high chance )   sitting and reading 3   watching TV 3   sitting inactive in a public place 2   being a passenger in a motor vehicle for an hour or more 3   lying down in the afternoon 3   sitting and talking to someone 3   sitting quietly after lunch (no alcohol) 3   while stopped for a few minutes in traffic as the driver 3   Total Score  23    I saw him for sleep evaluation on January 03, 2019.  At that time I had a lengthy discussion with him and reviewed potential adverse consequences of untreated sleep apnea affecting his normal's architecture as well as its cardiovascular ramifications.  After much discussion ultimately agreed to pursue further evaluation with a sleep study.    He underwent a polysomnogram on January 19, 2019 which revealed severe sleep apnea with an AHI of 41.1/h and RDI of 43.2/h.  Supine sleep AHI was 55.4/h.  There was absence of REM sleep.  His O2 nadir was 83%.  He subsequently underwent a CPAP/BiPAP titration on March 08, 2019.  CPAP was initiated at 6 cm and was titrated to 16 cm.  However due to continued events he was transitioned to BiPAP and ultimately was titrated to an optimal pressure of 20/16 with an O2 nadir at 92%.  BiPAP set up date was April 15, 2019.  He has been using a ResMed air fit F 20 medium size mask.  A download was obtained in the office today from May 19, 2019 through June 17, 2019.  Usage is 100% with average use is at 6 hours and 23 minutes.  He believes he is sleeping much better since reinstitution of therapy.  He is unaware of breakthrough snoring.  Frequent nocturia has  improved.  He has a significant leak with treatment with 95th percentile leak at 90.5.  At times he feels the pressure is too high but this may be contributed by his significant mask leak.  He still has residual daytime sleepiness and a new Epworth Sleepiness Scale score was calculated in the office today and this endorsed at 15 as shown below consistent with residual daytime sleepiness.    Epworth Sleepiness Scale: Situation   Chance of Dozing/Sleeping (0 = never , 1 = slight chance , 2 = moderate chance , 3 = high chance )   sitting and reading 3   watching TV 3   sitting inactive in a public place 2   being a passenger in a motor vehicle for an hour or more 2   lying down in the afternoon 3   sitting and talking to someone 1   sitting quietly after lunch (no alcohol) 1   while stopped for a few minutes in traffic as the driver 0   Total Score  15   He presents for evaluation.   Past Medical History:  Diagnosis Date  . Arthritis   . CAD S/P percutaneous coronary angioplasty 08/2004   CARDIOLOGIST-  DR DAVID HARDING; DES to LCx. X2  - Cypher 2.5 mm postdilated to 2.75 mm (20 mm and 13 mm stents);; Myoview 09/2014: LOW RISK. No Infarct or Ischemia.  . Colon polyps 05/10/2005   Hyperplastic polyps  . Complication of anesthesia    "takes long time to wake up"  . Depression   . Diabetes mellitus type 2 with complications (HCC)    neuropathy; CAD borderline- no med  . Diastolic dysfunction without heart failure    Moderate LVH, Gr 1 DD on Echo 2011; no CHF admissions, no tt on diuretisc  . Essential hypertension   . H/O: gout STABLE  . History of melanoma excision 2010   FOREHEAD  . History of prostate cancer 2004--  S/P SEED IMPLANTS    NO RECURRENCE  . Hydrocele, left   . Hyperlipidemia LDL goal <70   . Impaired hearing RIGHT HEARING AID  . OSA on CPAP    bipap  . PAF (paroxysmal atrial fibrillation) ([place]) 12/11/2017   Admitted with Afib RVR - converted on IV Diltiazem. d/c on  Eliquis; CHA2DS2Vasc = 5.  . Prostate cancer ([place])    prostate , melanoma head  . Shingles 07  . Syncope and collapse March 2017   Likely related to post micturition, Neurontin and dehydration with orthostatic hypotension  . Urgency of urination     Past Surgical History:  Procedure Laterality Date  . Cardiac Event Monitor  3-07/2015   Mostly SR - 58-132 bpm. Rare PVCs (  occ bigeminy), Frequent PACs - singlets, couplets - short runs of PAT.  . circumsision    . CORONARY ANGIOPLASTY WITH STENT PLACEMENT  09-01-2004     Cypher DES x 2 d-m LCx 2.5 mm x 20 mm & 2.5 mm x 13 mm (~2.75 mm)  . EXCISION MELANOMA FROM FOREHEAD  2010  . EYE SURGERY Bilateral    cataracts  . HYDROCELE EXCISION  10/14/2011   Procedure: HYDROCELECTOMY ADULT;  Surgeon: Ailene Rud, MD;  Location: [facility];  Service: Urology;  Laterality: Left;  . LAMINECTOMY N/A 07/13/2012   Procedure: THORACIC LAMINECTOMY FOR RESECTION OF INTRAMEDULLARY SPINAL CHORD TUMOR WITH SPINAL CHORD MONITORING;  Surgeon: Erline Levine, MD;  Location: [facility];  Service: Neurosurgery;  Laterality: N/A;  Thoracic laminectomy for resection of intramedullary spinal cord tumor with spinal cord monitering and Dr. Christella Noa to assist  . LAMINECTOMY N/A 12/09/2014   Procedure: Redo Thoracic laminectomy with intramedullary tumar resection/USN/[place]/Subarachnoid shunt/spinal cord monitoring/Dr. Kathyrn Sheriff to assist;  Surgeon: Erline Levine, MD;  Location: [facility];  Service: Neurosurgery;  Laterality: N/A;  Redo Thoracic laminectomy with intramedullary tumar resection/USN/[place]/Subarachnoid shunt/spinal cord monitoring/Dr. Kathyrn Sheriff to assist  . NM MYOVIEW LTD  3/212014; June 2016   Both Lexiscan: a. LOW RISK, mild inferior bowel artifact; No ischemia or Infarction; b. Low risk, normal study. No infarction or ischemia. No artifact.  Marland Kitchen PROSTATE PALLADIUM GOLD SEED IMPLANTS (118)  11-15-2002   PROSTATE CANCER  . SKIN BIOPSY    .  TONSILLECTOMY    . TRANSTHORACIC ECHOCARDIOGRAM  12/2017   (A. fib): Moderate concentric LVH.  EF 55 to 60%.  No wall motion normality.  Mild to moderately dilated left atrium.    Current Medications: Outpatient Medications Prior to Visit  Medication Sig Dispense Refill  . allopurinol (ZYLOPRIM) 300 MG tablet Take 1 tablet (300 mg total) by mouth daily. 30 tablet 1  . apixaban (ELIQUIS) 5 MG TABS tablet Take 1 tablet (5 mg total) by mouth 2 (two) times daily. 180 tablet 3  . diltiazem (CARDIZEM CD) 120 MG 24 hr capsule Take 1 capsule (120 mg total) by mouth daily as needed. 30 capsule 6  . escitalopram (LEXAPRO) 10 MG tablet Take 1 tablet by mouth daily.    . metFORMIN (GLUCOPHAGE) 500 MG tablet Take 1 tablet (500 mg total) by mouth 2 (two) times daily with a meal.    . oxybutynin (DITROPAN XL) 15 MG 24 hr tablet Take 1 tablet (15 mg total) by mouth at bedtime. 30 tablet 1  . rosuvastatin (CRESTOR) 20 MG tablet Take 1 tablet (20 mg total) by mouth daily. 30 tablet 6  . tamsulosin (FLOMAX) 0.4 MG CAPS capsule Take 1 capsule (0.4 mg total) by mouth at bedtime. 30 capsule 1  . telmisartan (MICARDIS) 40 MG tablet Take 1 tablet (40 mg total) by mouth daily. 30 tablet 6  . VITAMIN D, CHOLECALCIFEROL, PO Take 1 tablet by mouth daily.      No facility-administered medications prior to visit.     Allergies:   Betadine [povidone iodine], Contrast media [iodinated diagnostic agents], Iodine, and Shellfish allergy   Social History   Socioeconomic History  . Marital status: Married    Spouse name: Not on file  . Number of children: Not on file  . Years of education: Not on file  . Highest education level: Not on file  Occupational History  . Not on file  Tobacco Use  . Smoking status: Never Smoker  .  Smokeless tobacco: Never Used  . Tobacco comment: occ alcohol  Substance and Sexual Activity  . Alcohol use: Yes    Comment: OCCASIONAL  . Drug use: No  . Sexual activity: Not on file    Other Topics Concern  . Not on file  Social History Narrative   He is married, father of 2, grandfather of 80. Does not really get exercise now because   of his back issues. Does not smoke and drinks social alcohol. Works in [employer].   Social Determinants of Health   Financial Resource Strain:   . Difficulty of Paying Living Expenses:   Food Insecurity:   . Worried About Charity fundraiser in the Last Year:   . Arboriculturist in the Last Year:   Transportation Needs:   . Film/video editor (Medical):   Marland Kitchen Lack of Transportation (Non-Medical):   Physical Activity:   . Days of Exercise per Week:   . Minutes of Exercise per Session:   Stress:   . Feeling of Stress :   Social Connections:   . Frequency of Communication with Friends and Family:   . Frequency of Social Gatherings with Friends and Family:   . Attends Religious Services:   . Active Member of Clubs or Organizations:   . Attends Archivist Meetings:   Marland Kitchen Marital Status:     He was born in the [place].  He has lived in the [place] area for the last 40 years. 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  This was subsequently ended at the start of this year.  Family History:  The patient's family history includes Hypertension in his brother, father, and mother; Prostate cancer in his brother and father; Stroke in his mother.   ROS General: Negative; No fevers, chills, or night sweats;  HEENT: Negative; No changes in vision or hearing, sinus congestion, difficulty swallowing Pulmonary: Negative; No cough, wheezing, shortness of breath, hemoptysis Cardiovascular: CAD status post PCI by Dr. Melvern Banker; history of PAF; no recent chest pain, presyncope, syncope, palpitations GI: Negative; No nausea, vomiting, diarrhea, or abdominal pain GU: History of prostate CA Musculoskeletal: Negative; no myalgias, joint pain, or weakness Hematologic/Oncology: Negative; no easy bruising,  bleeding Endocrine: Negative; no heat/cold intolerance; no diabetes Neuro: Negative; no changes in balance, headaches Skin: History of melanoma Psychiatric: Negative; No behavioral problems, depression Sleep: See HPI ; positive for snoring, daytime sleepiness, hypersomnolence; now on BiPAP therapy; no bruxism, restless legs, hypnogognic hallucinations, no cataplexy Other comprehensive 14 point system review is negative.   PHYSICAL EXAM:   VS:  BP (!) 142/70   Pulse 79   Ht 5' 10" (1.778 m)   Wt 203 lb (92.1 kg)   SpO2 94%   BMI 29.13 kg/m     Repeat blood pressure by me 130/70  Wt Readings from Last 3 Encounters:  06/20/19 203 lb (92.1 kg)  03/08/19 200 lb (90.7 kg)  01/19/19 199 lb (90.3 kg)    General: Alert, oriented, no distress.  Skin: normal turgor, no rashes, warm and dry HEENT: Normocephalic, atraumatic. Pupils equal round and reactive to light; sclera anicteric; extraocular muscles intact; Nose without nasal septal hypertrophy Mouth/Parynx benign; Mallinpatti scale 3/4 Neck: No JVD, no carotid bruits; normal carotid upstroke Lungs: clear to ausculatation and percussion; no wheezing or rales Chest wall: without tenderness to palpitation Heart: PMI not displaced, RRR, s1 s2 normal, 1/6 systolic murmur, no diastolic murmur, no rubs, gallops, thrills, or  heaves Abdomen: soft, nontender; no hepatosplenomehaly, BS+; abdominal aorta nontender and not dilated by palpation. Back: no CVA tenderness Pulses 2+ Musculoskeletal: full range of motion, normal strength, no joint deformities Extremities: no clubbing cyanosis or edema, Homan's sign negative  Neurologic: grossly nonfocal; Cranial nerves grossly wnl Psychologic: Normal mood and affect   Studies/Labs Reviewed:   EKG:  EKG is not ordered today.    I personally reviewed the ECG of December 14, 2018 which shows normal sinus rhythm at 69 bpm.  PR interval at 200.  No ectopy.  Recent Labs: BMP Latest Ref Rng & Units  12/11/2017 12/11/2017 06/18/2015  Glucose 70 - 99 mg/dL 129(H) 138(H) 139(H)  BUN 8 - 23 mg/dL 20 20 25(H)  Creatinine 0.61 - 1.24 mg/dL 1.27(H) 1.39(H) 1.45(H)  Sodium 135 - 145 mmol/L 141 139 140  Potassium 3.5 - 5.1 mmol/L 4.1 4.2 3.8  Chloride 98 - 111 mmol/L 105 104 104  CO2 22 - 32 mmol/L _0 Calcium 8.9 - 10.3 mg/dL 8.7(L) 8.5(L) 8.7(L)     Hepatic Function Latest Ref Rng & Units 12/11/2017 06/18/2015 12/15/2014  Total Protein 6.5 - 8.1 g/dL 5.9(L) 5.8(L) 7.2  Albumin 3.5 - 5.0 g/dL 3.5 3.2(L) 3.6  AST 15 - 41 U/L _1 ALT 0 - 44 U/L _2 Alk Phosphatase 38 - 126 U/L 46 37(L) 81  Total Bilirubin 0.3 - 1.2 mg/dL 0.5 0.4 0.3    CBC Latest Ref Rng & Units 03/21/2018 12/12/2017 12/11/2017  WBC 3.4 - 10.8 x10E3/uL 9.4 8.8 10.8(H)  Hemoglobin 13.0 - 17.7 g/dL 14.3 13.1 14.1  Hematocrit 37.5 - 51.0 % 42.7 40.3 43.2  Platelets 150 - 450 x10E3/uL 229 172 189   Lab Results  Component Value Date   MCV 87 03/21/2018   MCV 88.6 12/12/2017   MCV 90.2 12/11/2017   Lab Results  Component Value Date   TSH 1.834 12/11/2017   Lab Results  Component Value Date   HGBA1C 6.9 (H) 12/01/2014     BNP    Component Value Date/Time   BNP 414.6 (H) 12/11/2017 1400    ProBNP No results found for: PROBNP   Lipid Panel     Component Value Date/Time   CHOL 112 04/16/2013 0959   CHOL 145 10/02/2012 1002   TRIG 143 04/16/2013 0959   TRIG 157 (H) 10/02/2012 1002   HDL 31 (L) 04/16/2013 0959   HDL 36 (L) 10/02/2012 1002   CHOLHDL 3.6 04/16/2013 0959   VLDL 29 04/16/2013 0959   LDLCALC 52 04/16/2013 0959   LDLCALC 78 10/02/2012 1002     RADIOLOGY: No results found.   Additional studies/ records that were reviewed today include:  I reviewed the patient's prior sleep study from 2007, subsequent [facility] office notes, records of Dr. Ellyn [name] as well as recent laboratory.  PSG: 01/19/2019 CLINICAL INFORMATION Sleep Study Type:  NPSG  Indication for sleep study: Hypertension, OSA with previous use of BiPAP  Epworth Sleepiness Score: 14  SLEEP STUDY TECHNIQUE As per the AASM Manual for the Scoring of Sleep and Associated Events v2.3 (April 2016) with a hypopnea requiring 4% desaturations.  The channels recorded and monitored were frontal, central and occipital EEG, electrooculogram (EOG), submentalis EMG (chin), nasal and oral airflow, thoracic and abdominal wall motion, anterior tibialis EMG, snore microphone, electrocardiogram, and pulse oximetry.  MEDICATIONS     allopurinol (ZYLOPRIM) 300 MG tablet  apixaban (ELIQUIS) 5 MG TABS tablet         diltiazem (CARDIZEM CD) 120 MG 24 hr capsule (Expired)         escitalopram (LEXAPRO) 10 MG tablet         metFORMIN (GLUCOPHAGE) 500 MG tablet         oxybutynin (DITROPAN XL) 15 MG 24 hr tablet         rosuvastatin (CRESTOR) 20 MG tablet         tamsulosin (FLOMAX) 0.4 MG CAPS capsule         telmisartan (MICARDIS) 40 MG tablet         VITAMIN D, CHOLECALCIFEROL, PO      Medications self-administered by patient taken the night of the study : N/A  SLEEP ARCHITECTURE The study was initiated at 9:49:43 PM and ended at 4:33:30 AM.  Sleep onset time was 165.3 minutes and the sleep efficiency was 27.9%%. The total sleep time was 112.5 minutes.  Stage REM latency was N/A minutes.  The patient spent 53.8%% of the night in stage N1 sleep, 46.2%% in stage N2 sleep, 0.0%% in stage N3 and 0% in REM.  Alpha intrusion was absent.  Supine sleep was 43.29%.  RESPIRATORY PARAMETERS The overall apnea/hypopnea index (AHI) was 41.1 per hour. The respiratory disturbance index (RDI) was 43.2/h.There were 24 total apneas, including 24 obstructive, 0 central and 0 mixed apneas. There were 53 hypopneas and 4 RERAs.  The AHI during Stage REM sleep was N/A per hour.  AHI while supine was 55.4 per hour.  The mean oxygen saturation was 92.9%. The minimum SpO2  during sleep was 83.0%.  Moderate snoring was noted during this study.  CARDIAC DATA The 2 lead EKG demonstrated sinus rhythm. The mean heart rate was 59.6 beats per minute. Other EKG findings include: PVCs.  LEG MOVEMENT DATA The total PLMS were 0 with a resulting PLMS index of 0.0. Associated arousal with leg movement index was 8.0.  IMPRESSIONS - Severe obstructive sleep apnea  (AHI 41.1/h; RDI 43.2/h). Events were worse with supine sleep (AHI 55.4/h). REM sleep did not occur during this study. - No significant central sleep apnea occurred during this study (CAI = 0.0/h). - Mild oxygen desaturation to a nadir of 83.0%. - Abnormal sleep archecture with absence of slow wave and REM sleep.  - Reduced sleep efficiency at only 27.9%. - The patient snored with moderate snoring volume. - EKG findings include PVCs and PACs - Clinically significant periodic limb movements did not occur during sleep. Associated arousals were significant.  DIAGNOSIS - Obstructive Sleep Apnea (327.23 [G47.33 ICD-10]) - Periodic Limb Movement During Sleep (327.51 [G47.61 ICD-10]) - Nocturnal Hypoxemia (327.26 [G47.36 ICD-10])  RECOMMENDATIONS - In this patient with severe sleep apnea, recommend a therapeutic in-lab CPAP titration to determine optimal pressure required to alleviate sleep disordered breathing. - Effort sjhould be made to optimize nasal and oropharyngeal patency. - Positional therapy avoiding supine position during sleep. - Avoid alcohol, sedatives and other CNS depressants that may worsen sleep apnea and disrupt normal sleep architecture. - Sleep hygiene should be reviewed to assess factors that may improve sleep quality. - Weight management and regular exercise should be initiated or continued if appropriate.   TITRATION STUDY: 03/08/2019 RESPIRATORY PARAMETERS Diagnostic  Total AHI (/hr):            12.5     RDI (/hr):         15.1     OA Index (/hr):  2.4       CA Index  (/hr):      1.7 REM AHI (/hr):            0.0       NREM AHI (/hr):          14.7     Supine AHI (/hr):         14.5     Non-supine AHI (/hr):        15.27 Min O2 Sat (%):          85.0     Mean O2 (%):  94.2     Time below 88% (min):           1.8         Titration  Optimal IPAP Pressure (cm): 20        Optimal EPAP Pressure (cm):            16        AHI at Optimal Pressure (/hr):          0.0       Min O2 at Optimal Pressure (%):       92.0 Sleep % at Optimal (%):         99        Supine % at Optimal (%):       50          SLEEP ARCHITECTURE The study was initiated at 9:52:21 PM and terminated at 5:13:38 AM. The total recorded time was 441.3 minutes. EEG confirmed total sleep time was 378.5 minutes yielding a sleep efficiency of 85.8%%. Sleep onset after lights out was 40.1 minutes with a REM latency of 40.5 minutes. The patient spent 3.7%% of the night in stage N1 sleep, 81.2%% in stage N2 sleep, 0.0%% in stage N3 and 15.1% in REM. Wake after sleep onset (WASO) was 22.7 minutes. The Arousal Index was 33.4/hour.  LEG MOVEMENT DATA The total Periodic Limb Movements of Sleep (PLMS) were 0. The PLMS index was 0.0 .  CARDIAC DATA The 2 lead EKG demonstrated sinus rhythm. The mean heart rate was 100.0 beats per minute. Other EKG findings include: PVCs.  IMPRESSIONS - CPAP was initiated at 6 cm and was titrated to 16, due to continued events BiPAP was initiated at 18/14 and was titrated to optimal BiPAP pressure of 20/16 cm of water. AHI at 20/16 was 0 with O 2 nadir at 92%. - No significant central sleep apnea occurred during the diagnostic portion of the study (CAI = 1.7/hour). - No snoring was audible during this study. - EKG findings include PVCs. - Clinically significant periodic limb movements of sleep did not occur during the study.  DIAGNOSIS - Obstructive Sleep Apnea (327.23 [G47.33 ICD-10]) - Periodic Limb Movement During Sleep (327.51 [G47.61 ICD-10])  RECOMMENDATIONS -  Recommend an initial trial of BiPAP therapy at 20/16 cm H2O with heated humidification. A Large size Fisher&Paykel Full Face Mask Simplus mask was used for the titration. - Effort should be made to optimize nasal and oropharyngeal patency. - Avoid alcohol, sedatives and other CNS depressants that may worsen sleep apnea and disrupt normal sleep architecture. - Sleep hygiene should be reviewed to assess factors that may improve sleep quality. - Weight management and regular exercise should be initiated or continued. - Recommend a download in 30 days and sleep clinic evaluation after 4 weeks of therapy.  ASSESSMENT:    1. OSA (obstructive sleep apnea)   2. Excessive  daytime sleepiness   3. Essential hypertension   4. Hyperlipidemia LDL goal <70   5. CAD S/P percutaneous coronary angioplasty: Cypher DES x2 to circumflex   6. PAF (paroxysmal atrial fibrillation) ([place])   7. Anticoagulation adequate, Eliquis with CHA2DS2VASc of 5     PLAN:  [name] is an 84 year-old gentleman who has cardiovascular comorbidities including CAD, status post remote PCI to his circumflex vessel in 2006, history of PAF, as well as a history of prostate CA, melanoma, and has recently been followed by Dr. Ellyn [name] for his care.  He has a history of documented sleep apnea which initially was diagnosed in 2007.  AHI was 25/h.  He failed CPAP and BiPAP as result of continued events as well as central events and ultimately underwent VPAP ASV titration.  Apparently he was on ASV therapy for several years but ultimately discontinued treatment and no longer has a machine for the last several years.  When I saw him for my initial sleep evaluation in October 2020 he was symptomatic with snoring, frequent awakenings, nocturia, and had significant excessive daytime sleepiness with an Epworth Sleepiness Scale score endorsed at 23 today.  I again reviewed potential adverse consequences of untreated sleep apnea on affecting normal  sleep architecture as well as its cardiovascular ramifications.  After much discussion he agreed to pursue another evaluation.   His echo Doppler study in September 2019 showed an EF of 55 to 60% and if he had evident central events he would qualify for reinstitution of ASV therapy.  I thoroughly reviewed both his diagnostic polysomnogram as well as his CPAP/BiPAP titration studies from October and December thousand 20.  He has now been on BiPAP therapy since April 15, 2019.  He is sleeping much better.  He typically goes at between 9 and 10 at night and wakes up at variable times.  He is meeting compliance standards.  AHI however is elevated and his previous settings were at an EPAP of 16 and IPAP of 20.  AHI is elevated at 16.5.  As result I have recommended changing him to a Pap auto mode and will initiate his EPAP minimum at 14 cm with potential IPAP max up to 25 cm.  This should significantly prove his AHI which is still in the moderate sleep apnea range at 16.5 and hopefully further improve his residual daytime sleepiness.  We discussed mask cleansing.  He may ultimately require changing his mask to a different style and in the future may benefit from a trial of a ResMed air fit F 30 Y mask.  We will obtain a follow-up download with the above changes.  Further adjustments will be made as necessary.  His blood pressure today is stable and on repeat by me was 130/70 on his regimen consent of diltiazem CD 120 mg and telmisartan 40 mg daily.  He continues to be on rosuvastatin 20 mg for hyperlipidemia with LDL at the setting.  He is diabetic on metformin.  He continues to be on Eliquis anticoagulation.  He will follow-up cardiology care with Dr. Ellyn [name].  Per Medicare guidelines I will see him at minimum in a year for follow-up evaluation.    Medication Adjustments/Labs and Tests Ordered: Current medicines are reviewed at length with the patient today.  Concerns regarding medicines are outlined above.   Medication changes, Labs and Tests ordered today are listed in the Patient Instructions below. Patient Instructions  Medication Instructions:  CONTINUE WITH CURRENT MEDICATIONS. NO CHANGES.  *If  you need a refill on your cardiac medications before your next appointment, please call your pharmacy*  Follow-Up: At [facility], you and your health needs are our priority.  As part of our continuing mission to provide you with exceptional heart care, we have created designated Provider Care Teams.  These Care Teams include your primary Cardiologist (physician) and Advanced Practice Providers (APPs -  Physician Assistants and Nurse Practitioners) who all work together to provide you with the care you need, when you need it.  We recommend signing up for the patient portal called "MyChart".  Sign up information is provided on this After Visit Summary.  MyChart is used to connect with patients for Virtual Visits (Telemedicine).  Patients are able to view lab/test results, encounter notes, upcoming appointments, etc.  Non-urgent messages can be sent to your provider as well.   To learn more about what you can do with MyChart, go to [email or web address].    Your next appointment:   12 month(s)  The format for your next appointment:   In Person  Provider:   Shelva Majestic, MD       Signed, Charles Majestic, MD  06/22/2019 12:52 PM    [facility] [address] [place], [place], [place]  [address] Phone: [phone]

## 2019-08-16 DIAGNOSIS — Z20828 Contact with and (suspected) exposure to other viral communicable diseases: Secondary | ICD-10-CM | POA: Diagnosis not present

## 2019-09-12 ENCOUNTER — Other Ambulatory Visit: Payer: Self-pay | Admitting: Physician Assistant

## 2019-09-12 ENCOUNTER — Ambulatory Visit
Admission: RE | Admit: 2019-09-12 | Discharge: 2019-09-12 | Disposition: A | Discharge status: Home / Self Care | Payer: Medicare Other | Source: Ambulatory Visit | Attending: Physician Assistant | Admitting: Physician Assistant

## 2019-09-12 DIAGNOSIS — W19XXXA Unspecified fall, initial encounter: Secondary | ICD-10-CM | POA: Diagnosis not present

## 2019-09-12 DIAGNOSIS — R0781 Pleurodynia: Secondary | ICD-10-CM | POA: Diagnosis not present

## 2019-09-12 DIAGNOSIS — M545 Low back pain: Secondary | ICD-10-CM | POA: Diagnosis not present

## 2019-09-12 DIAGNOSIS — S299XXA Unspecified injury of thorax, initial encounter: Secondary | ICD-10-CM | POA: Diagnosis not present

## 2019-09-19 DIAGNOSIS — E119 Type 2 diabetes mellitus without complications: Secondary | ICD-10-CM | POA: Diagnosis not present

## 2019-09-19 DIAGNOSIS — H04123 Dry eye syndrome of bilateral lacrimal glands: Secondary | ICD-10-CM | POA: Diagnosis not present

## 2019-09-19 DIAGNOSIS — Z7984 Long term (current) use of oral hypoglycemic drugs: Secondary | ICD-10-CM | POA: Diagnosis not present

## 2019-09-19 DIAGNOSIS — Z961 Presence of intraocular lens: Secondary | ICD-10-CM | POA: Diagnosis not present

## 2019-09-27 DIAGNOSIS — I10 Essential (primary) hypertension: Secondary | ICD-10-CM | POA: Diagnosis not present

## 2019-09-27 DIAGNOSIS — I2510 Atherosclerotic heart disease of native coronary artery without angina pectoris: Secondary | ICD-10-CM | POA: Diagnosis not present

## 2019-09-27 DIAGNOSIS — I701 Atherosclerosis of renal artery: Secondary | ICD-10-CM | POA: Diagnosis not present

## 2019-09-27 DIAGNOSIS — F325 Major depressive disorder, single episode, in full remission: Secondary | ICD-10-CM | POA: Diagnosis not present

## 2019-09-27 DIAGNOSIS — E782 Mixed hyperlipidemia: Secondary | ICD-10-CM | POA: Diagnosis not present

## 2019-09-27 DIAGNOSIS — I480 Paroxysmal atrial fibrillation: Secondary | ICD-10-CM | POA: Diagnosis not present

## 2019-09-27 DIAGNOSIS — Z8546 Personal history of malignant neoplasm of prostate: Secondary | ICD-10-CM | POA: Diagnosis not present

## 2019-09-27 DIAGNOSIS — E1122 Type 2 diabetes mellitus with diabetic chronic kidney disease: Secondary | ICD-10-CM | POA: Diagnosis not present

## 2019-09-27 DIAGNOSIS — N182 Chronic kidney disease, stage 2 (mild): Secondary | ICD-10-CM | POA: Diagnosis not present

## 2019-10-03 ENCOUNTER — Ambulatory Visit (INDEPENDENT_AMBULATORY_CARE_PROVIDER_SITE_OTHER): Payer: Medicare Other | Admitting: Cardiology

## 2019-10-03 ENCOUNTER — Encounter: Payer: Self-pay | Admitting: Cardiology

## 2019-10-03 ENCOUNTER — Other Ambulatory Visit: Payer: Self-pay

## 2019-10-03 VITALS — BP 134/68 | HR 67 | Temp 97.8°F | Ht 69.5 in | Wt 201.8 lb

## 2019-10-03 DIAGNOSIS — I491 Atrial premature depolarization: Secondary | ICD-10-CM

## 2019-10-03 DIAGNOSIS — E785 Hyperlipidemia, unspecified: Secondary | ICD-10-CM

## 2019-10-03 DIAGNOSIS — I2510 Atherosclerotic heart disease of native coronary artery without angina pectoris: Secondary | ICD-10-CM | POA: Diagnosis not present

## 2019-10-03 DIAGNOSIS — R5382 Chronic fatigue, unspecified: Secondary | ICD-10-CM

## 2019-10-03 DIAGNOSIS — Z9861 Coronary angioplasty status: Secondary | ICD-10-CM | POA: Diagnosis not present

## 2019-10-03 DIAGNOSIS — I480 Paroxysmal atrial fibrillation: Secondary | ICD-10-CM

## 2019-10-03 DIAGNOSIS — I5189 Other ill-defined heart diseases: Secondary | ICD-10-CM | POA: Diagnosis not present

## 2019-10-03 DIAGNOSIS — I10 Essential (primary) hypertension: Secondary | ICD-10-CM

## 2019-10-03 MED ORDER — DILTIAZEM HCL ER COATED BEADS 180 MG PO CP24
180.0000 mg | ORAL_CAPSULE | Freq: Every day | ORAL | 3 refills | Status: DC
Start: 2019-10-03 — End: 2020-01-17

## 2019-10-03 MED ORDER — CHLORTHALIDONE 25 MG PO TABS: 12.5000 mg | ORAL_TABLET | Freq: Every day | ORAL | 3 refills | Status: DC

## 2019-10-03 NOTE — Progress Notes (Signed)
Primary Care Provider: Lavone Orn, MD Cardiologist: Glenetta Hew, MD Electrophysiologist: None  Clinic Note: Chief Complaint  Patient presents with  . Follow-up    Generalized weakness and fatigue, no active cardiac symptoms  . Coronary Artery Disease    No angina  . Atrial Fibrillation    No recurrent symptoms    HPI:    [name] is a 84 y.o. male with a PMH below who presents today for 14-month follow-up for CAD having PCI and recent diagnosis of A. fib along with OSA on CPAP.  [name] was last seen on December 14, 2018.  His back is normal jovial self.  He is always noted some fatigue, getting tired walk 1 block.  When I saw him in May 2020 via telemedicine, he had several falls and felt dizzy.  We noticed diltiazem dose.  He felt much less dizzy.  Normal falls.  Was frustrated by not being addressed for exercises because of COVID-19.  No recurrent A. fib symptoms.  Recent Hospitalizations: None  Reviewed  CV studies:    The following studies were reviewed today: (if available, images/films reviewed: From Epic Chart or Care Everywhere) . None:   Interval History:   [name] returns here today overall doing fairly well.  He is just feels like he is tired all the time.  He tells me that he feels like he is "dying ".  He is feels exhausted dizzy.  He is really bothered by the fact that his legs not work and he can control his bowel movements and bladder control.  He can tell if he is finished or not.  He fell about 2 weeks ago and landed on his back.  He has been having really strange vivid dreams.  He is a little stressed today because his wife is actually scheduled for lung mass removal today.  Despite all these other unusual complaints, he really has not had true cardiac issues of chest pain, pressure or dyspnea.  No edema.  In fact he has a little bit of maybe orthostatic dizziness.  His ability to do any exercise is limited by the fact that he  has a very unsteady gait, but is not yet ready to use a walker.  Still uses a cane.   CV Review of Symptoms (Summary) Cardiovascular ROS: positive for - Some exertional dyspnea and fatigue.  Dizziness and generalized weakness.  No syncope or near syncope..   negative for - edema, irregular heartbeat, orthopnea, palpitations, paroxysmal nocturnal dyspnea, rapid heart rate, shortness of breath or TIA/amaurosis fugax, claudication  The patient does not have symptoms concerning for COVID-19 infection (fever, chills, cough, or new shortness of breath).  The patient is practicing social distancing & Masking.   COVID-19 vaccine: Completed in February Immunization History  Administered Date(s) Administered  . Influenza,inj,Quad PF,6+ Mos 12/10/2014  . PFIZER SARS-COV-2 Vaccination 04/17/2019, 05/08/2019  . Pneumococcal Polysaccharide-23 07/14/2012    REVIEWED OF SYSTEMS   Review of Systems  Constitutional: Positive for malaise/fatigue (Feels tired, dizzy.  Falls asleep easily). Negative for weight loss.  HENT: Negative for congestion and nosebleeds.   Respiratory: Negative for shortness of breath.   Cardiovascular: Positive for leg swelling (mild).  Gastrointestinal: Positive for heartburn. Negative for blood in stool and melena.       Lack of bowel greater than bladder control.  Genitourinary: Negative for hematuria.  Musculoskeletal: Positive for back pain, falls and joint pain.  Neurological: Positive for dizziness and weakness (  Bilateral legs are weak, poor balance). Negative for focal weakness and headaches.  Psychiatric/Behavioral: Negative for memory loss. The patient is not nervous/anxious.        Has a sense of generally not feeling well.  Says he feels like he is "dying".  Seems depressed    I have reviewed and (if needed) personally updated the patient's problem list, medications, allergies, past medical and surgical history, social and family history.   PAST MEDICAL HISTORY    Past Medical History:  Diagnosis Date  . Arthritis   . CAD S/P percutaneous coronary angioplasty 08/2004   CARDIOLOGIST-  DR Lynden Carrithers; DES to LCx. X2  - Cypher 2.5 mm postdilated to 2.75 mm (20 mm and 13 mm stents);; Myoview 09/2014: LOW RISK. No Infarct or Ischemia.  . Colon polyps 05/10/2005   Hyperplastic polyps  . Complication of anesthesia    "takes long time to wake up"  . Depression   . Diabetes mellitus type 2 with complications (HCC)    neuropathy; CAD borderline- no med  . Diastolic dysfunction without heart failure    Moderate LVH, Gr 1 DD on Echo 2011; no CHF admissions, no tt on diuretisc  . Essential hypertension   . H/O: gout STABLE  . History of melanoma excision 2010   FOREHEAD  . History of prostate cancer 2004--  S/P SEED IMPLANTS    NO RECURRENCE  . Hydrocele, left   . Hyperlipidemia LDL goal <70   . Impaired hearing RIGHT HEARING AID  . OSA on CPAP    bipap  . PAF (paroxysmal atrial fibrillation) ([place]) 12/11/2017   Admitted with Afib RVR - converted on IV Diltiazem. d/c on Eliquis; CHA2DS2Vasc = 5.  . Prostate cancer ([place])    prostate , melanoma head  . Shingles 07  . Syncope and collapse March 2017   Likely related to post micturition, Neurontin and dehydration with orthostatic hypotension  . Urgency of urination     PAST SURGICAL HISTORY   Past Surgical History:  Procedure Laterality Date  . Cardiac Event Monitor  3-07/2015   Mostly SR - 58-132 bpm. Rare PVCs (occ bigeminy), Frequent PACs - singlets, couplets - short runs of PAT.  . circumsision    . CORONARY ANGIOPLASTY WITH STENT PLACEMENT  09-01-2004     Cypher DES x 2 d-m LCx 2.5 mm x 20 mm & 2.5 mm x 13 mm (~2.75 mm)  . EXCISION MELANOMA FROM FOREHEAD  2010  . EYE SURGERY Bilateral    cataracts  . HYDROCELE EXCISION  10/14/2011   Procedure: HYDROCELECTOMY ADULT;  Surgeon: Ailene Rud, MD;  Location: [facility];  Service: Urology;  Laterality: Left;  . LAMINECTOMY  N/A 07/13/2012   Procedure: THORACIC LAMINECTOMY FOR RESECTION OF INTRAMEDULLARY SPINAL CHORD TUMOR WITH SPINAL CHORD MONITORING;  Surgeon: Erline Levine, MD;  Location: [facility];  Service: Neurosurgery;  Laterality: N/A;  Thoracic laminectomy for resection of intramedullary spinal cord tumor with spinal cord monitering and Dr. Christella Noa to assist  . LAMINECTOMY N/A 12/09/2014   Procedure: Redo Thoracic laminectomy with intramedullary tumar resection/USN/[place]/Subarachnoid shunt/spinal cord monitoring/Dr. Kathyrn Sheriff to assist;  Surgeon: Erline Levine, MD;  Location: [facility];  Service: Neurosurgery;  Laterality: N/A;  Redo Thoracic laminectomy with intramedullary tumar resection/USN/[place]/Subarachnoid shunt/spinal cord monitoring/Dr. Kathyrn Sheriff to assist  . NM MYOVIEW LTD  3/212014; June 2016   Both Lexiscan: a. LOW RISK, mild inferior bowel artifact; No ischemia or Infarction; b. Low risk, normal study. No infarction  or ischemia. No artifact.  Marland Kitchen PROSTATE PALLADIUM GOLD SEED IMPLANTS (118)  11-15-2002   PROSTATE CANCER  . SKIN BIOPSY    . TONSILLECTOMY    . TRANSTHORACIC ECHOCARDIOGRAM  12/2017   (A. fib): Moderate concentric LVH.  EF 55 to 60%.  No wall motion normality.  Mild to moderately dilated left atrium.    MEDICATIONS/ALLERGIES   Current Meds  Medication Sig  . allopurinol (ZYLOPRIM) 300 MG tablet Take 1 tablet (300 mg total) by mouth daily.  Marland Kitchen apixaban (ELIQUIS) 5 MG TABS tablet Take 1 tablet (5 mg total) by mouth 2 (two) times daily.  . chlorthalidone (HYGROTON) 25 MG tablet Take 0.5 tablets (12.5 mg total) by mouth daily.  Marland Kitchen escitalopram (LEXAPRO) 10 MG tablet Take 1 tablet by mouth daily.  Marland Kitchen Lifitegrast (XIIDRA) 5 % SOLN Place 1 drop into both eyes 2 times daily.  . metFORMIN (GLUCOPHAGE) 500 MG tablet Take 1 tablet (500 mg total) by mouth 2 (two) times daily with a meal.  . ONETOUCH ULTRA test strip USE TO TEST BLOOD SUGAR ONCE OR TWICE A DAY (E11.22)  . oxybutynin (DITROPAN XL)  15 MG 24 hr tablet Take 1 tablet (15 mg total) by mouth at bedtime.  . tamsulosin (FLOMAX) 0.4 MG CAPS capsule Take 1 capsule (0.4 mg total) by mouth at bedtime.  Marland Kitchen telmisartan (MICARDIS) 80 MG tablet Take 80 mg by mouth daily.  Marland Kitchen VITAMIN D, CHOLECALCIFEROL, PO Take 1 tablet by mouth daily.   . [DISCONTINUED] chlorthalidone (HYGROTON) 25 MG tablet Take 25 mg by mouth daily.  . [DISCONTINUED] diltiazem (CARDIZEM CD) 240 MG 24 hr capsule Take 240 mg by mouth daily.  . [DISCONTINUED] rosuvastatin (CRESTOR) 20 MG tablet Take 1 tablet (20 mg total) by mouth daily.    Allergies  Allergen Reactions  . Betadine [Povidone Iodine] Anaphylaxis  . Contrast Media [Iodinated Diagnostic Agents] Anaphylaxis and Rash    Other Reaction: Other reaction  . Iodine Anaphylaxis    Shellfish, IVP dye. Swelling in throat and foaming at mouth.  . Shellfish Allergy Anaphylaxis    SOCIAL HISTORY/FAMILY HISTORY   Reviewed in Epic:  Pertinent findings: -> Wife having surgery today.  OBJCTIVE -PE, EKG, labs   Wt Readings from Last 3 Encounters:  10/03/19 201 lb 12.8 oz (91.5 kg)  06/20/19 203 lb (92.1 kg)  03/08/19 200 lb (90.7 kg)  Weight is 195 at home  Physical Exam: BP 134/68   Pulse 67   Temp 97.8 F (36.6 C)   Ht 5' 9.5" (1.765 m)   Wt 201 lb 12.8 oz (91.5 kg)   SpO2 97%   BMI 29.37 kg/m  Physical Exam Constitutional:      General: He is not in acute distress.    Appearance: Normal appearance. He is obese.  HENT:     Head: Normocephalic and atraumatic.  Cardiovascular:     Rate and Rhythm: Normal rate and regular rhythm. Occasional extrasystoles are present.    Chest Wall: PMI is not displaced.     Pulses: Normal pulses.     Heart sounds: Normal heart sounds. No murmur heard.  No friction rub. No gallop.   Pulmonary:     Effort: Pulmonary effort is normal. No respiratory distress.     Breath sounds: No wheezing, rhonchi or rales.  Musculoskeletal:        General: Swelling (Trivial)  present. Normal range of motion.     Cervical back: Normal range of motion and neck supple.  Comments: Walks with a cane, slow somewhat unsteady gait  Neurological:     General: No focal deficit present.     Mental Status: He is alert and oriented to person, place, and time.  Psychiatric:        Behavior: Behavior normal.        Thought Content: Thought content normal.        Judgment: Judgment normal.     Comments: Seems a bit more depressed than usual.      Adult ECG Report  Rate: 67 ;  Rhythm: normal sinus rhythm and Left axis deviation (-42).  T wave inversions in the lateral leads, cannot exclude ischemia.;   Narrative Interpretation: Stable  Recent Labs: February 2021: TC 251, TG 232, HDL 39, LDL 168.  Hgb 14.3.  A1c 6.0.  CR 1.57.  K+ 3.9. Lab Results  Component Value Date   CHOL 112 04/16/2013   HDL 31 (L) 04/16/2013   LDLCALC 52 04/16/2013   TRIG 143 04/16/2013   CHOLHDL 3.6 04/16/2013   Lab Results  Component Value Date   CREATININE 1.27 (H) 12/11/2017   BUN 20 12/11/2017   NA 141 12/11/2017   K 4.1 12/11/2017   CL 105 12/11/2017   CO2 26 12/11/2017   Lab Results  Component Value Date   TSH 1.834 12/11/2017    ASSESSMENT/PLAN    Problem List Items Addressed This Visit    CAD S/P percutaneous coronary angioplasty: Cypher DES x2 to circumflex - Primary (Chronic)    Very distant history of CAD having PCI. Not on aspirin because of DOAC  Need to confirm whether he is or is not taking statin.  Not a beta-blocker because of fatigue, we have switched to diltiazem.  Reducing dose because of fatigue.      Relevant Medications   telmisartan (MICARDIS) 80 MG tablet   diltiazem (CARDIZEM CD) 180 MG 24 hr capsule   chlorthalidone (HYGROTON) 25 MG tablet   Other Relevant Orders   EKG 12-Lead (Completed)   PAF (paroxysmal atrial fibrillation) ; CHA2DS2-VASc Score =5 (Eliquis) (Chronic)    He has not had any symptoms to suggest A. Fib. He is on moderate-dose  diltiazem-however I will reduce the dose to 180 mg because of his fatigue. . (Certainly not using beta-blocker because of fatigue)  Eliquis with no bleeding issues.      Relevant Medications   telmisartan (MICARDIS) 80 MG tablet   diltiazem (CARDIZEM CD) 180 MG 24 hr capsule   chlorthalidone (HYGROTON) 25 MG tablet   Essential hypertension (Chronic)    Blood pressure actually looks pretty good today.  With him feeling tired and fatigued, plan will be to reduce dose of diltiazem to 180 mg as well as chlorthalidone to 12.5 mg since is having some orthostatic symptoms.  For now continue Micardis as is.      Relevant Medications   telmisartan (MICARDIS) 80 MG tablet   diltiazem (CARDIZEM CD) 180 MG 24 hr capsule   chlorthalidone (HYGROTON) 25 MG tablet   Hyperlipidemia LDL goal <70 (Chronic)    Most recent labs are well out of control.  He has rosuvastatin listed, but I am not sure if he is truly taking.  We talked about long-term risk and benefits of statin versus not taking statin.  With his lipids with a currently are, he indicated that he would prefer to stop statin just in case it could be causing his symptoms.  We may just simply have to see how he  does off of statin, and then follow-up visit we can discuss other options for lipid management.      Relevant Medications   telmisartan (MICARDIS) 80 MG tablet   diltiazem (CARDIZEM CD) 180 MG 24 hr capsule   chlorthalidone (HYGROTON) 25 MG tablet   Finding of multiple premature atrial contractions by electrocardiography (Chronic)    Goes along with A. fib.  On diltiazem      Relevant Medications   telmisartan (MICARDIS) 80 MG tablet   diltiazem (CARDIZEM CD) 180 MG 24 hr capsule   chlorthalidone (HYGROTON) 25 MG tablet   Diastolic dysfunction without heart failure (Chronic)    Euvolemic on exam.  Really only would get symptomatic when he is in A. fib.  With him having orthostatic symptoms, on reduce his propranolol dose in half.       Relevant Orders   EKG 12-Lead (Completed)   Chronic fatigue    Somewhat improved with switching from beta-blocker to diltiazem.  Plan was to continue to wean down diltiazem as well is not having any tachycardia.  Will reduce down to 180 mg daily.  Reducing chlorthalidone dose to 12.5 mg daily and stopping statin.       Other Visit Diagnoses    Coronary artery disease involving native coronary artery of native heart without angina pectoris       Relevant Medications   telmisartan (MICARDIS) 80 MG tablet   diltiazem (CARDIZEM CD) 180 MG 24 hr capsule   chlorthalidone (HYGROTON) 25 MG tablet   Other Relevant Orders   EKG 12-Lead (Completed)       COVID-19 Education: The signs and symptoms of COVID-19 were discussed with the patient and how to seek care for testing (follow up with PCP or arrange E-visit).   The importance of social distancing and COVID-19 vaccination was discussed today.  I spent a total of 73minutes with the patient. >  50% of the time was spent in direct patient consultation.  Additional time spent with chart review  / charting (studies, outside notes, etc): 7 Total Time: 30 min   Current medicines are reviewed at length with the patient today.  (+/- concerns) n/a  Notice: This dictation was prepared with Dragon dictation along with smaller phrase technology. Any transcriptional errors that result from this process are unintentional and may not be corrected upon review.  Patient Instructions / Medication Changes & Studies & Tests Ordered   Patient Instructions  Medication Instructions:    Stop taking  Rosuvastatin   Stop taking Diltiazem 240 mg  Decrease chlorthalidone to 1/2 tablet daily -- 12.5 mg    start taking Diltiazem  180 mg  Daily    *If you need a refill on your cardiac medications before your next appointment, please call your pharmacy*   Lab Work: Not needed If you have labs (blood work) drawn today and your tests are completely  normal, you will receive your results only by: Marland Kitchen MyChart Message (if you have MyChart) OR . A paper copy in the mail If you have any lab test that is abnormal or we need to change your treatment, we will call you to review the results.   Testing/Procedures:  not needed   Follow-Up: At [facility], you and your health needs are our priority.  As part of our continuing mission to provide you with exceptional heart care, we have created designated Provider Care Teams.  These Care Teams include your primary Cardiologist (physician) and Advanced Practice Providers (APPs -  Physician Assistants and Nurse Practitioners) who all work together to provide you with the care you need, when you need it.  We recommend signing up for the patient portal called "MyChart".  Sign up information is provided on this After Visit Summary.  MyChart is used to connect with patients for Virtual Visits (Telemedicine).  Patients are able to view lab/test results, encounter notes, upcoming appointments, etc.  Non-urgent messages can be sent to your provider as well.   To learn more about what you can do with MyChart, go to [email or web address].    Your next appointment:   3 to 4 month(s)  The format for your next appointment:   Either In Person or Virtual  Provider:   Glenetta Hew, MD    Studies Ordered:   Orders Placed This Encounter  Procedures  . EKG 12-Lead     Glenetta Hew, M.D., M.S. Interventional Cardiologist   Pager # [phone] Phone # [phone] [address] [place], [place] [address]   Thank you for choosing Heartcare at [facility]!!

## 2019-10-03 NOTE — Patient Instructions (Addendum)
Medication Instructions:    Stop taking  Rosuvastatin   Stop taking Diltiazem 240 mg  Decrease chlorthalidone to 1/2 tablet daily -- 12.5 mg    start taking Diltiazem  180 mg  Daily    *If you need a refill on your cardiac medications before your next appointment, please call your pharmacy*   Lab Work: Not needed If you have labs (blood work) drawn today and your tests are completely normal, you will receive your results only by: Marland Kitchen MyChart Message (if you have MyChart) OR . A paper copy in the mail If you have any lab test that is abnormal or we need to change your treatment, we will call you to review the results.   Testing/Procedures:  not needed   Follow-Up: At [facility], you and your health needs are our priority.  As part of our continuing mission to provide you with exceptional heart care, we have created designated Provider Care Teams.  These Care Teams include your primary Cardiologist (physician) and Advanced Practice Providers (APPs -  Physician Assistants and Nurse Practitioners) who all work together to provide you with the care you need, when you need it.  We recommend signing up for the patient portal called "MyChart".  Sign up information is provided on this After Visit Summary.  MyChart is used to connect with patients for Virtual Visits (Telemedicine).  Patients are able to view lab/test results, encounter notes, upcoming appointments, etc.  Non-urgent messages can be sent to your provider as well.   To learn more about what you can do with MyChart, go to [email or web address].    Your next appointment:   3 to 4 month(s)  The format for your next appointment:   Either In Person or Virtual  Provider:   Glenetta Hew, MD

## 2019-10-07 ENCOUNTER — Encounter: Payer: Self-pay | Admitting: Cardiology

## 2019-10-07 NOTE — Assessment & Plan Note (Addendum)
Somewhat improved with switching from beta-blocker to diltiazem.  Plan was to continue to wean down diltiazem as well is not having any tachycardia.  Will reduce down to 180 mg daily.  Reducing chlorthalidone dose to 12.5 mg daily and stopping statin.

## 2019-10-07 NOTE — Assessment & Plan Note (Signed)
Goes along with A. fib.  On diltiazem

## 2019-10-07 NOTE — Assessment & Plan Note (Signed)
Very distant history of CAD having PCI. Not on aspirin because of DOAC  Need to confirm whether he is or is not taking statin.  Not a beta-blocker because of fatigue, we have switched to diltiazem.  Reducing dose because of fatigue.

## 2019-10-07 NOTE — Assessment & Plan Note (Signed)
Blood pressure actually looks pretty good today.  With him feeling tired and fatigued, plan will be to reduce dose of diltiazem to 180 mg as well as chlorthalidone to 12.5 mg since is having some orthostatic symptoms.  For now continue Micardis as is.

## 2019-10-07 NOTE — Assessment & Plan Note (Addendum)
Most recent labs are well out of control.  He has rosuvastatin listed, but I am not sure if he is truly taking.  We talked about long-term risk and benefits of statin versus not taking statin.  With his lipids with a currently are, he indicated that he would prefer to stop statin just in case it could be causing his symptoms.  We may just simply have to see how he does off of statin, and then follow-up visit we can discuss other options for lipid management.

## 2019-10-07 NOTE — Assessment & Plan Note (Signed)
Euvolemic on exam.  Really only would get symptomatic when he is in A. fib.  With him having orthostatic symptoms, on reduce his propranolol dose in half.

## 2019-10-07 NOTE — Assessment & Plan Note (Signed)
He has not had any symptoms to suggest A. Fib. He is on moderate-dose diltiazem-however I will reduce the dose to 180 mg because of his fatigue. . (Certainly not using beta-blocker because of fatigue)  Eliquis with no bleeding issues.

## 2019-10-08 IMAGING — CR DG CHEST 2V
2 series · 2 of 2 positions shown · non-contrast
Comparison: CT chest dated 06/27/2012

CLINICAL DATA: Dizziness, diaphoresis, AFib

EXAM:
CHEST - 2 VIEW

[chest pa]
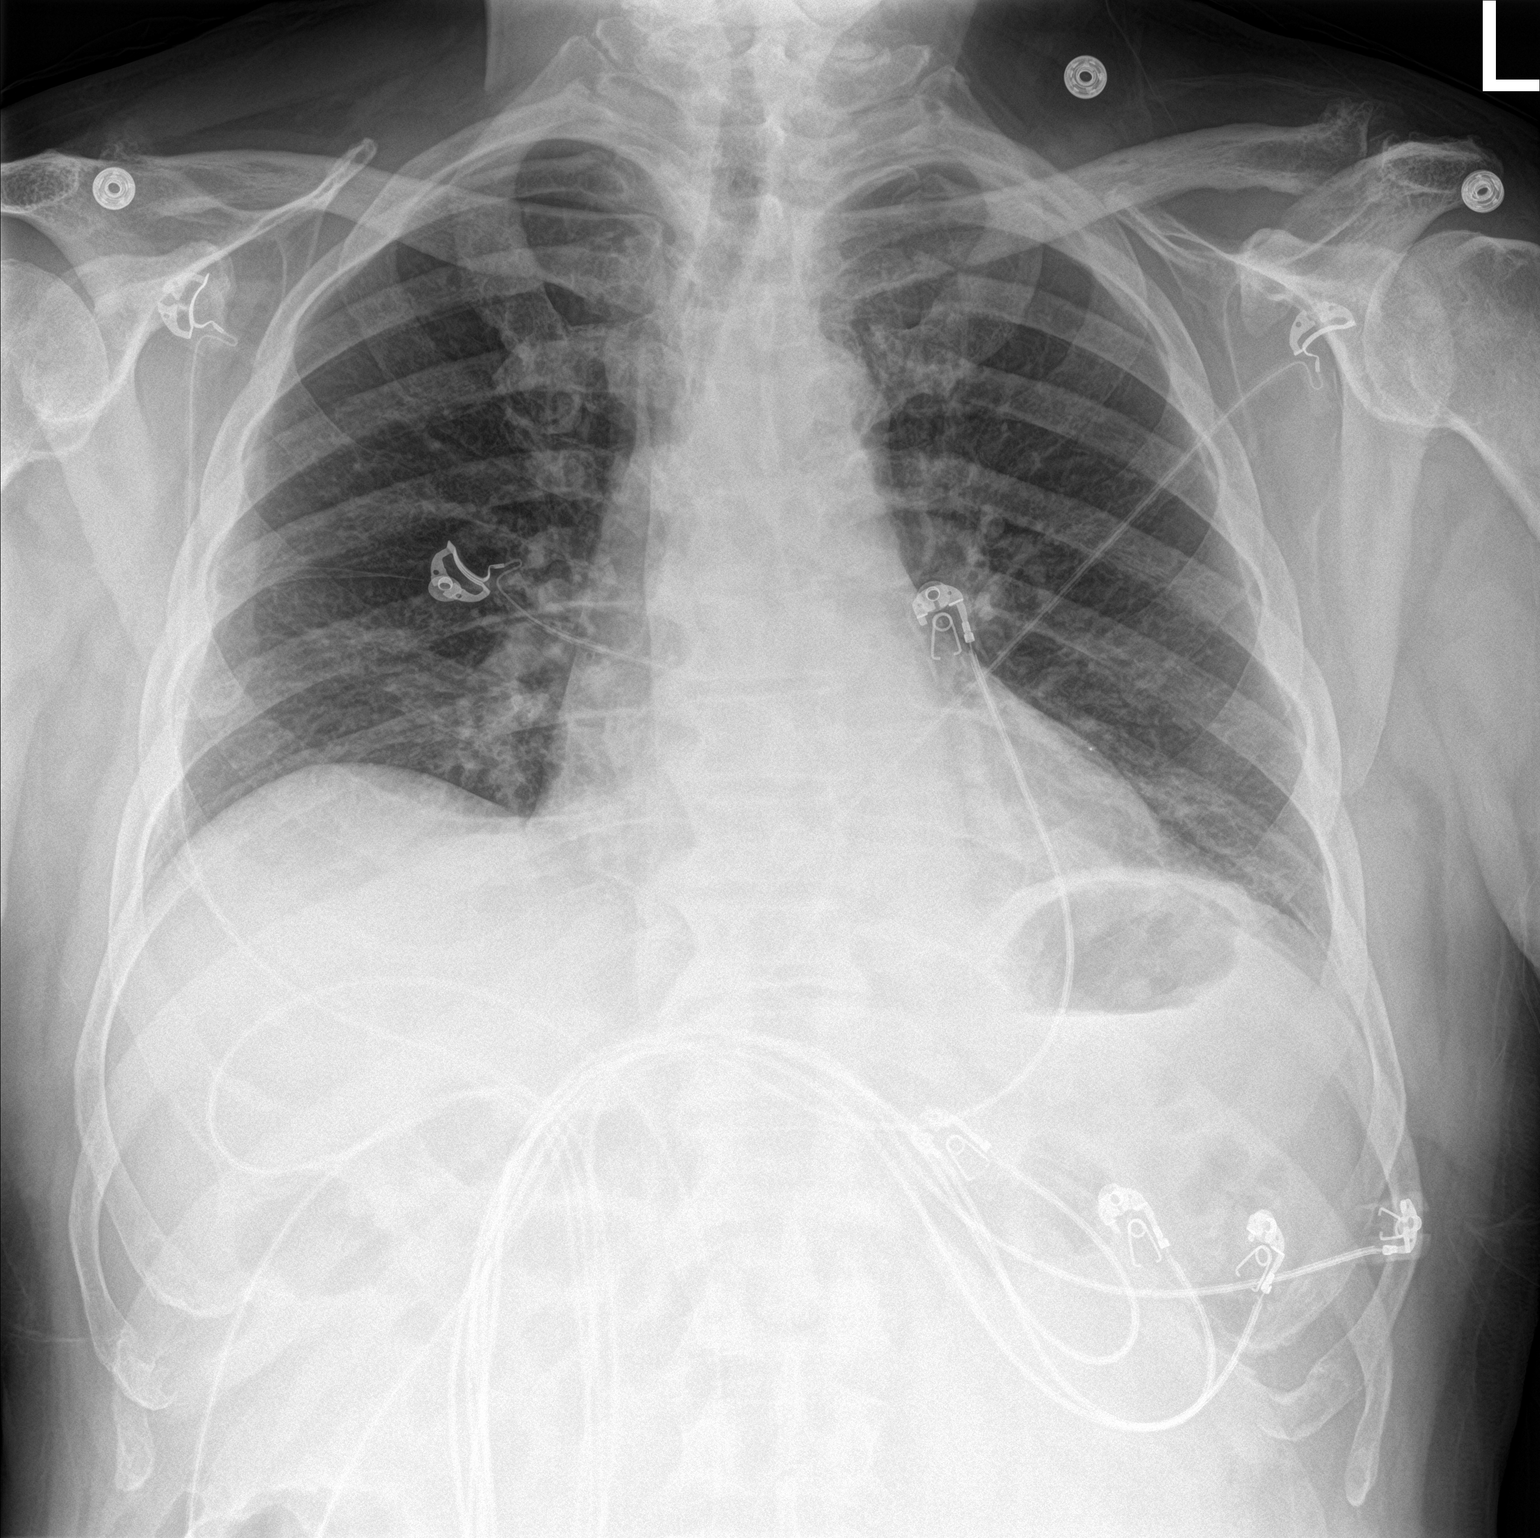

[chest lat]
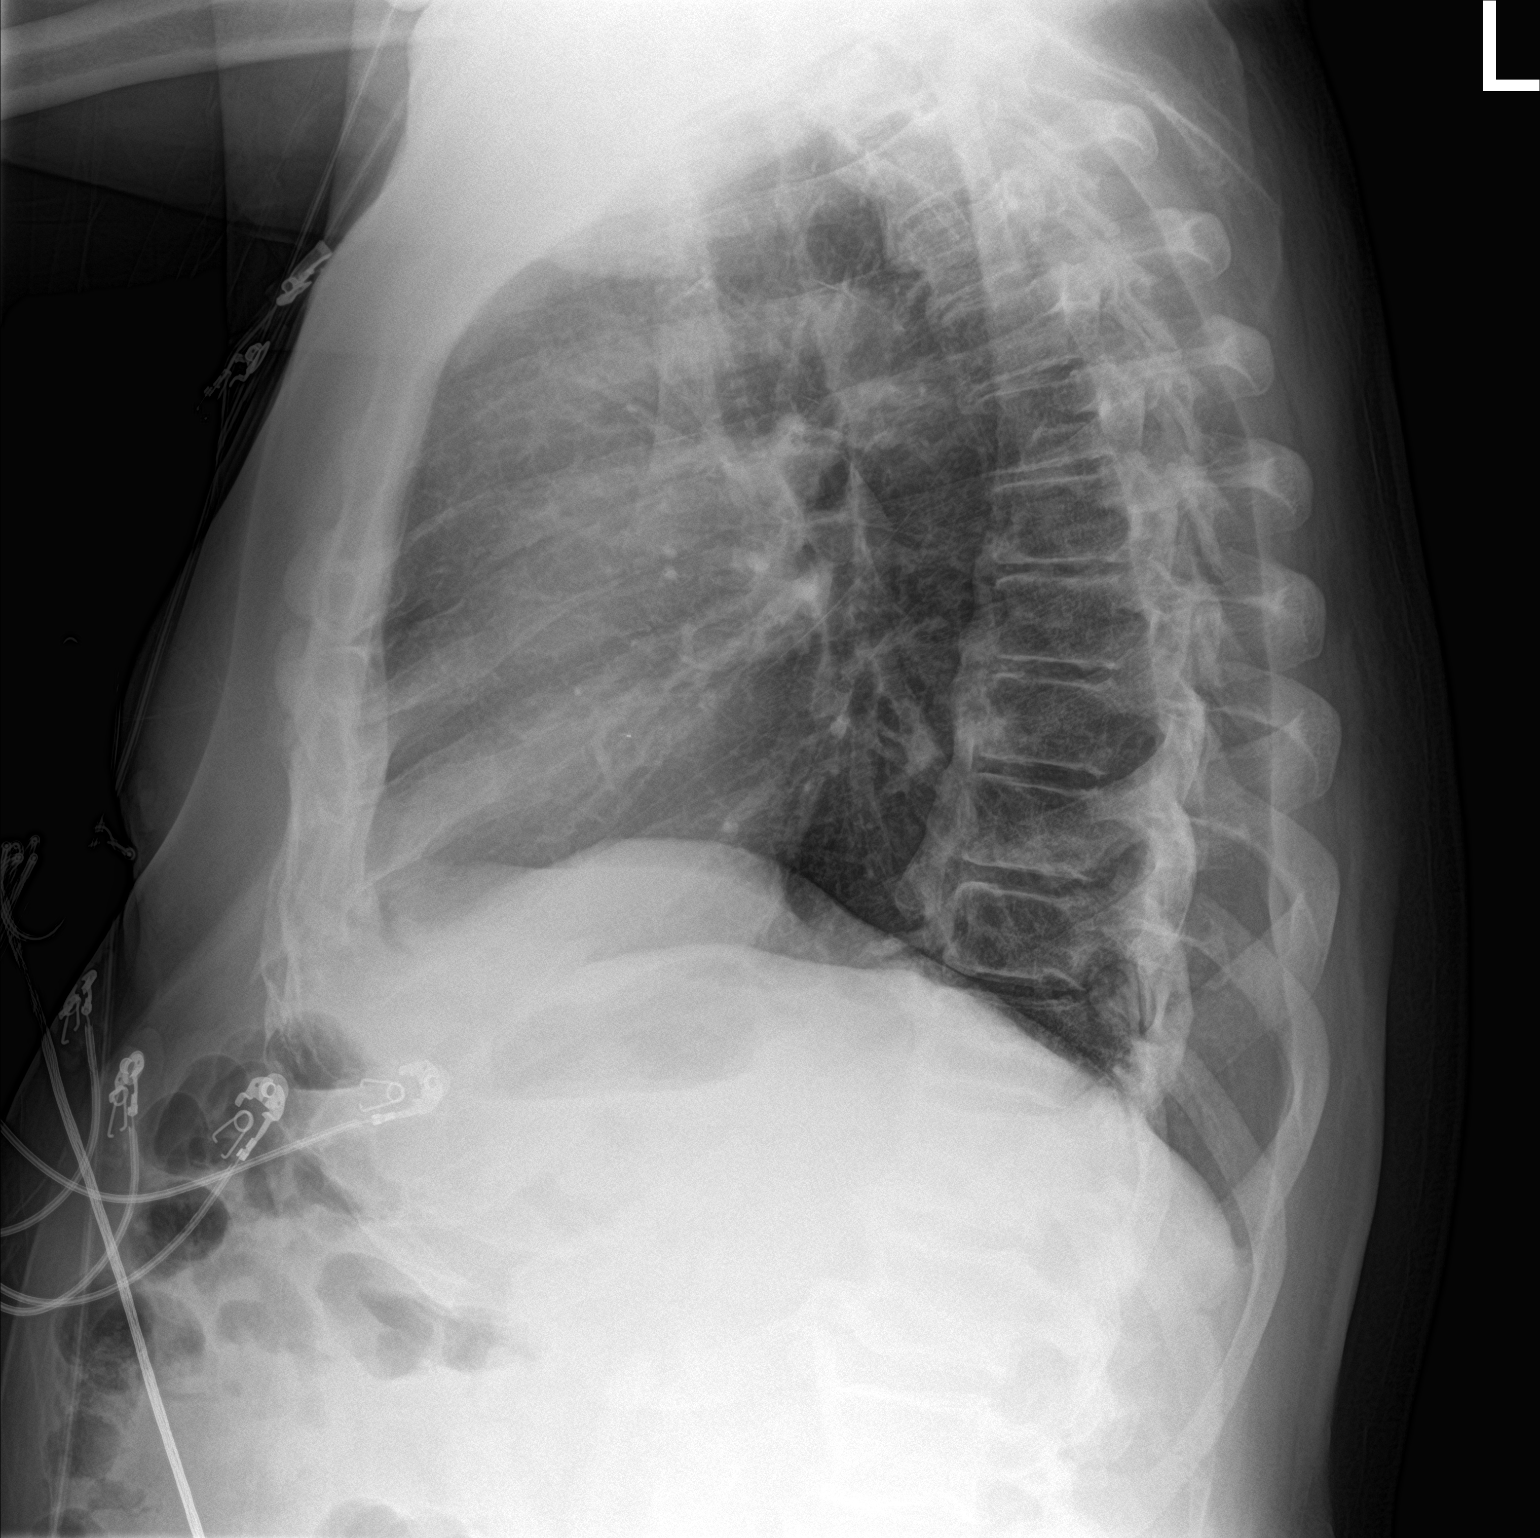

[2 of 2 positions shown; findings below may reference images not displayed]

FINDINGS: Lungs are clear.  No pleural effusion or pneumothorax.

The heart is normal in size.

Degenerative changes of the visualized thoracolumbar spine.
IMPRESSION: No evidence of acute cardiopulmonary disease.

## 2019-10-19 DIAGNOSIS — Z20822 Contact with and (suspected) exposure to covid-19: Secondary | ICD-10-CM | POA: Diagnosis not present

## 2019-12-05 DIAGNOSIS — Z23 Encounter for immunization: Secondary | ICD-10-CM | POA: Diagnosis not present

## 2019-12-19 DIAGNOSIS — I2510 Atherosclerotic heart disease of native coronary artery without angina pectoris: Secondary | ICD-10-CM | POA: Diagnosis not present

## 2019-12-19 DIAGNOSIS — F325 Major depressive disorder, single episode, in full remission: Secondary | ICD-10-CM | POA: Diagnosis not present

## 2019-12-19 DIAGNOSIS — I701 Atherosclerosis of renal artery: Secondary | ICD-10-CM | POA: Diagnosis not present

## 2019-12-19 DIAGNOSIS — I10 Essential (primary) hypertension: Secondary | ICD-10-CM | POA: Diagnosis not present

## 2019-12-19 DIAGNOSIS — E1122 Type 2 diabetes mellitus with diabetic chronic kidney disease: Secondary | ICD-10-CM | POA: Diagnosis not present

## 2019-12-19 DIAGNOSIS — Z8546 Personal history of malignant neoplasm of prostate: Secondary | ICD-10-CM | POA: Diagnosis not present

## 2019-12-19 DIAGNOSIS — N182 Chronic kidney disease, stage 2 (mild): Secondary | ICD-10-CM | POA: Diagnosis not present

## 2019-12-19 DIAGNOSIS — I480 Paroxysmal atrial fibrillation: Secondary | ICD-10-CM | POA: Diagnosis not present

## 2019-12-19 DIAGNOSIS — E782 Mixed hyperlipidemia: Secondary | ICD-10-CM | POA: Diagnosis not present

## 2019-12-23 DIAGNOSIS — Z23 Encounter for immunization: Secondary | ICD-10-CM | POA: Diagnosis not present

## 2019-12-27 DIAGNOSIS — R413 Other amnesia: Secondary | ICD-10-CM | POA: Diagnosis not present

## 2020-01-17 ENCOUNTER — Encounter: Payer: Self-pay | Admitting: Cardiology

## 2020-01-17 ENCOUNTER — Telehealth: Payer: Self-pay | Admitting: *Deleted

## 2020-01-17 ENCOUNTER — Telehealth (INDEPENDENT_AMBULATORY_CARE_PROVIDER_SITE_OTHER): Payer: Medicare Other | Admitting: Cardiology

## 2020-01-17 VITALS — BP 175/95 | HR 62 | Ht 69.0 in | Wt 195.0 lb

## 2020-01-17 DIAGNOSIS — R5382 Chronic fatigue, unspecified: Secondary | ICD-10-CM

## 2020-01-17 DIAGNOSIS — I10 Essential (primary) hypertension: Secondary | ICD-10-CM | POA: Diagnosis not present

## 2020-01-17 DIAGNOSIS — I2510 Atherosclerotic heart disease of native coronary artery without angina pectoris: Secondary | ICD-10-CM | POA: Diagnosis not present

## 2020-01-17 DIAGNOSIS — I480 Paroxysmal atrial fibrillation: Secondary | ICD-10-CM

## 2020-01-17 DIAGNOSIS — Z9861 Coronary angioplasty status: Secondary | ICD-10-CM | POA: Diagnosis not present

## 2020-01-17 DIAGNOSIS — E785 Hyperlipidemia, unspecified: Secondary | ICD-10-CM

## 2020-01-17 MED ORDER — AMLODIPINE BESYLATE 5 MG PO TABS
5.0000 mg | ORAL_TABLET | Freq: Every day | ORAL | 2 refills | Status: DC
Start: 2020-01-17 — End: 2020-07-06

## 2020-01-17 NOTE — Assessment & Plan Note (Signed)
Distant history of CAD-PCI.  Currently on DOAC and therefore not on aspirin or Plavix. He is actually back on statin, indicating that there was no change in symptoms.  At this point we had switched him from beta-blocker to calcium channel blocker, but with continued fatigue, I am of the simply have him stop diltiazem and convert back to amlodipine because of hypertension.  Otherwise continue ACE inhibitor.

## 2020-01-17 NOTE — Assessment & Plan Note (Signed)
Amazingly, his blood pressures look much better last time.  At this point, plan will be to switch back from diltiazem 180 mg daily to amlodipine 5 mg daily.    We will continue current dose of Micardis along with chlorthalidone 12.5 mg to avoid orthostasis.  I am okay with mild permissive hypertension For systolic pressures in the 140/80 range.

## 2020-01-17 NOTE — Progress Notes (Signed)
Virtual Visit via Telephone Note   This visit type was conducted due to national recommendations for restrictions regarding the COVID-19 Pandemic (e.g. social distancing) in an effort to limit this patient's exposure and mitigate transmission in our community.  Due to his co-morbid illnesses, this patient is at least at moderate risk for complications without adequate follow up.  This format is felt to be most appropriate for this patient at this time.  The patient did not have access to video technology/had technical difficulties with video requiring transitioning to audio format only (telephone).  All issues noted in this document were discussed and addressed.  No physical exam could be performed with this format.  Please refer to the patient's chart for his  consent to telehealth for [facility].   Patient has given verbal permission to conduct this visit via virtual appointment and to bill insurance 01/17/2020 3:01 PM     Evaluation Performed:  Follow-up visit  Date:  01/17/2020   ID:  [name], DOB 01-08-1936, MRN [id number]  Patient Location: Home Provider Location: Home Office  PCP:  Lavone Orn, MD  Cardiologist:  Glenetta Hew, MD  Electrophysiologist:  None   Chief Complaint:   Chief Complaint  Patient presents with  . Follow-up  . Coronary Artery Disease    History of Present Illness:    [name] is a 84 y.o. male with PMH notable for CAD having PCI as well as atrial fibrillation along OSA on CPAP who presents via audio/video conferencing for a telehealth visit today as a 36-month follow-up.  Problem List Items Addressed This Visit    CAD S/P percutaneous coronary angioplasty: Cypher DES x2 to circumflex - Primary (Chronic)    Distant history of CAD-PCI.  Currently on DOAC and therefore not on aspirin or Plavix. He is actually back on statin, indicating that there was no change in symptoms.  At this point we had switched him from beta-blocker to  calcium channel blocker, but with continued fatigue, I am of the simply have him stop diltiazem and convert back to amlodipine because of hypertension.  Otherwise continue ACE inhibitor.      Relevant Medications   amLODipine (NORVASC) 5 MG tablet   PAF (paroxysmal atrial fibrillation) ; CHA2DS2-VASc Score =5 (Eliquis) (Chronic)    No further symptoms to suggest A. fib. His heart rates at rest are in the 60s.  I am concerned that maybe his fatigue may be related to just appropriate rate control.  Plan:   Discontinue diltiazem and go back to amlodipine.  Continue on Eliquis      Relevant Medications   amLODipine (NORVASC) 5 MG tablet   Essential hypertension (Chronic)    Amazingly, his blood pressures look much better last time.  At this point, plan will be to switch back from diltiazem 180 mg daily to amlodipine 5 mg daily.    We will continue current dose of Micardis along with chlorthalidone 12.5 mg to avoid orthostasis.  I am okay with mild permissive hypertension For systolic pressures in the 140/80 range.      Relevant Medications   amLODipine (NORVASC) 5 MG tablet   Hyperlipidemia LDL goal <70 (Chronic)    Back on statin.  His lipids have not been controlled.  At this point I think we will hold off on being overly aggressive and until we resolve his fatigue.      Relevant Medications   amLODipine (NORVASC) 5 MG tablet   Chronic fatigue  No real difference having converted from beta-blocker to calcium channel blocker.  At this point we will switch from nondihydropyridine to a dihydropyridine calcium channel blocker for better blood pressure control.  We will avoid any rate control agents/AV nodal agents.  Allow for mild permissive hypertension.  Not sure how much of this is related to psychosomatic symptoms with his wife's declining health as well as his declining health.  I think there may be some component of depression.  Defer to PCP.         [name]  was last seen back on October 03, 2019 where he just felt extremely fatigued.  Feeling tired all the time as" dying "feeling exhausted and dizzy.  Modifying factors life not working.  Not able to control his bowel or bladder.  He had a dream that he was dying.  Also very stressed about his wife being diagnosed with lung mass.  Backed off his diltiazem, and held statin  Hospitalizations:  . None   Recent - Interim CV studies:   The following studies were reviewed today: . n/a:  Inerval History   [name] seems to be in much better spirits today.  Does not seem as depressed.  He is still upset and almost worn out because of his wife's ongoing issues now.  She ended up having significant lung resection surgery for her lung cancer and now she is just extremely weak.  She has barely recovered to surgery is now starting to chemotherapy.  He says he is "not cut out to be a caregiver "but is doing the best he can" with helping her bathe and get dressed as well as trying to have meals ready for.  Thankfully, there are friends coming over to bring plenty of food such that he does not to cook.  Partly because there is some probably just because of his underlying health issues himself, he is still feeling just worn out and tired all the time.  She will have off-and-on episodes of chest discomfort that come and go without any particular rhyme or reason.  Basically the spells seem to be sharp stabbing discomfort that he can usually resolved by punching himself in the chest or massaging his chest.  They are more related to certain movements then exertion.  He denies any active exertional chest pain.  Really he does notice that he is extremely fatigued, stating that he has no energy to get up and do anything.  He has not a good job of keeping track of his blood pressures, but most recently they have been quite elevated.  He has not noted any irregular heartbeats palpitations to suggest A. fib.  He has been  more constipated of late with some hemorrhoids, and has not had any significant bleeding with any blood or his stools or urine.  No dark tarry stools.  Cardiovascular ROS: positive for - chest pain, dyspnea on exertion and Fatigue.  Chest pain is not anginal in nature. negative for - edema, irregular heartbeat, orthopnea, palpitations, paroxysmal nocturnal dyspnea, rapid heart rate, shortness of breath or Syncope/near syncope or TIA/amaurosis fugax, claudication   ROS:  Please see the history of present illness.    The patient does not have symptoms concerning for COVID-19 infection (fever, chills, cough, or new shortness of breath).  Review of Systems  Constitutional: Positive for malaise/fatigue (Feels tired all the time.  Less dizzy now.  Easily falls asleep.). Negative for weight loss.  HENT: Negative for congestion and nosebleeds.  Respiratory: Negative for shortness of breath.   Gastrointestinal: Positive for constipation (He may be going to the bathroom every 2 or 3 days.  It had previously been regular.  In fact he had previously had poor continence). Negative for abdominal pain, blood in stool (He has had some hemorrhoids with some mild blood but nothing significant) and melena.  Genitourinary: Negative for hematuria.  Musculoskeletal: Positive for joint pain.  Neurological: Positive for weakness (Legs are weak). Negative for dizziness (Less prominent), focal weakness and loss of consciousness.  Psychiatric/Behavioral: Positive for depression (If not depressed, clearly anhedonia.  He is having a hard time with his wife's health). Negative for memory loss. The patient is not nervous/anxious and does not have insomnia (The fact he is sleeping more than usual.).    The patient is practicing social distancing.  Past Medical History:  Diagnosis Date  . Arthritis   . CAD S/P percutaneous coronary angioplasty 08/2004   CARDIOLOGIST-  DR Krissy Orebaugh; DES to LCx. X2  - Cypher 2.5 mm  postdilated to 2.75 mm (20 mm and 13 mm stents);; Myoview 09/2014: LOW RISK. No Infarct or Ischemia.  . Colon polyps 05/10/2005   Hyperplastic polyps  . Complication of anesthesia    "takes long time to wake up"  . Depression   . Diabetes mellitus type 2 with complications (HCC)    neuropathy; CAD borderline- no med  . Diastolic dysfunction without heart failure    Moderate LVH, Gr 1 DD on Echo 2011; no CHF admissions, no tt on diuretisc  . Essential hypertension   . H/O: gout STABLE  . History of melanoma excision 2010   FOREHEAD  . History of prostate cancer 2004--  S/P SEED IMPLANTS    NO RECURRENCE  . Hydrocele, left   . Hyperlipidemia LDL goal <70   . Impaired hearing RIGHT HEARING AID  . OSA on CPAP    bipap  . PAF (paroxysmal atrial fibrillation) ([place]) 12/11/2017   Admitted with Afib RVR - converted on IV Diltiazem. d/c on Eliquis; CHA2DS2Vasc = 5.  . Prostate cancer ([place])    prostate , melanoma head  . Shingles 07  . Syncope and collapse March 2017   Likely related to post micturition, Neurontin and dehydration with orthostatic hypotension  . Urgency of urination    Past Surgical History:  Procedure Laterality Date  . Cardiac Event Monitor  3-07/2015   Mostly SR - 58-132 bpm. Rare PVCs (occ bigeminy), Frequent PACs - singlets, couplets - short runs of PAT.  . circumsision    . CORONARY ANGIOPLASTY WITH STENT PLACEMENT  09-01-2004     Cypher DES x 2 d-m LCx 2.5 mm x 20 mm & 2.5 mm x 13 mm (~2.75 mm)  . EXCISION MELANOMA FROM FOREHEAD  2010  . EYE SURGERY Bilateral    cataracts  . HYDROCELE EXCISION  10/14/2011   Procedure: HYDROCELECTOMY ADULT;  Surgeon: Ailene Rud, MD;  [facility];  Service: Urology;  Laterality: Left;  . LAMINECTOMY N/A 07/13/2012   Procedure: THORACIC LAMINECTOMY FOR RESECTION OF INTRAMEDULLARY SPINAL CHORD TUMOR WITH SPINAL CHORD MONITORING;  Surgeon: Erline Levine, MD;  Location: [place] [facility];  Service:  Neurosurgery;  Laterality: N/A;  Thoracic laminectomy for resection of intramedullary spinal cord tumor with spinal cord monitering and Dr. Christella Noa to assist  . LAMINECTOMY N/A 12/09/2014   Procedure: Redo Thoracic laminectomy with intramedullary tumar resection/USN/[place]/Subarachnoid shunt/spinal cord monitoring/Dr. Kathyrn Sheriff to assist;  Surgeon: Erline Levine, MD;  Location: [place] [facility];  Service: Neurosurgery;  Laterality: N/A;  Redo Thoracic laminectomy with intramedullary tumar resection/USN/[place]/Subarachnoid shunt/spinal cord monitoring/Dr. Kathyrn Sheriff to assist  . NM MYOVIEW LTD  3/212014; June 2016   Both Lexiscan: a. LOW RISK, mild inferior bowel artifact; No ischemia or Infarction; b. Low risk, normal study. No infarction or ischemia. No artifact.  Marland Kitchen PROSTATE PALLADIUM GOLD SEED IMPLANTS (118)  11-15-2002   PROSTATE CANCER  . SKIN BIOPSY    . TONSILLECTOMY    . TRANSTHORACIC ECHOCARDIOGRAM  12/2017   (A. fib): Moderate concentric LVH.  EF 55 to 60%.  No wall motion normality.  Mild to moderately dilated left atrium.     Current Meds  Medication Sig  . allopurinol (ZYLOPRIM) 300 MG tablet Take 1 tablet (300 mg total) by mouth daily.  Marland Kitchen apixaban (ELIQUIS) 5 MG TABS tablet Take 1 tablet (5 mg total) by mouth 2 (two) times daily.  . chlorthalidone (HYGROTON) 25 MG tablet Take 0.5 tablets (12.5 mg total) by mouth daily.  Marland Kitchen escitalopram (LEXAPRO) 10 MG tablet Take 1 tablet by mouth daily.  Marland Kitchen Lifitegrast (XIIDRA) 5 % SOLN Place 1 drop into both eyes 2 times daily.  . metFORMIN (GLUCOPHAGE) 500 MG tablet Take 1 tablet (500 mg total) by mouth 2 (two) times daily with a meal.  . ONETOUCH ULTRA test strip USE TO TEST BLOOD SUGAR ONCE OR TWICE A DAY (E11.22)  . oxybutynin (DITROPAN XL) 15 MG 24 hr tablet Take 1 tablet (15 mg total) by mouth at bedtime.  . tamsulosin (FLOMAX) 0.4 MG CAPS capsule Take 1 capsule (0.4 mg total) by mouth at bedtime.  Marland Kitchen telmisartan (MICARDIS) 80 MG tablet Take 80 mg by  mouth daily.  Marland Kitchen VITAMIN D, CHOLECALCIFEROL, PO Take 1 tablet by mouth daily.   . [DISCONTINUED] diltiazem (CARDIZEM CD) 180 MG 24 hr capsule Take 1 capsule (180 mg total) by mouth daily.     Allergies:   Betadine [povidone iodine], Contrast media [iodinated diagnostic agents], Iodine, and Shellfish allergy   Social History   Tobacco Use  . Smoking status: Never Smoker  . Smokeless tobacco: Never Used  . Tobacco comment: occ alcohol  Vaping Use  . Vaping Use: Never used  Substance Use Topics  . Alcohol use: Yes    Comment: OCCASIONAL  . Drug use: No     Family Hx: The patient's family history includes Hypertension in his brother, father, and mother; Prostate cancer in his brother and father; Stroke in his mother.   Labs/Other Tests and Data Reviewed:    EKG:  No ECG reviewed.  Recent Labs: No results found for requested labs within last 8760 hours.   Recent Lipid Panel No new labs  Wt Readings from Last 3 Encounters:  01/17/20 195 lb (88.5 kg)  10/03/19 201 lb 12.8 oz (91.5 kg)  06/20/19 203 lb (92.1 kg)     Objective:    Vital Signs:  BP (!) 175/95   Pulse 62   Ht 5\' 9"  (1.753 m)   Wt 195 lb (88.5 kg)   BMI 28.80 kg/m   VITAL SIGNS:  reviewed Pleasant elderly male in no acute distress. A&O x 3.  Somewhat down Mood & Affect Non-labored respirations   ASSESSMENT & PLAN:    Problem List Items Addressed This Visit    CAD S/P percutaneous coronary angioplasty: Cypher DES x2 to circumflex - Primary (Chronic)    Distant history of CAD-PCI.  Currently on DOAC and therefore not on aspirin  or Plavix. He is actually back on statin, indicating that there was no change in symptoms.  At this point we had switched him from beta-blocker to calcium channel blocker, but with continued fatigue, I am of the simply have him stop diltiazem and convert back to amlodipine because of hypertension.  Otherwise continue ACE inhibitor.      Relevant Medications   amLODipine  (NORVASC) 5 MG tablet   PAF (paroxysmal atrial fibrillation) ; CHA2DS2-VASc Score =5 (Eliquis) (Chronic)    No further symptoms to suggest A. fib. His heart rates at rest are in the 60s.  I am concerned that maybe his fatigue may be related to just appropriate rate control.  Plan:   Discontinue diltiazem and go back to amlodipine.  Continue on Eliquis      Relevant Medications   amLODipine (NORVASC) 5 MG tablet   Essential hypertension (Chronic)    Amazingly, his blood pressures look much better last time.  At this point, plan will be to switch back from diltiazem 180 mg daily to amlodipine 5 mg daily.    We will continue current dose of Micardis along with chlorthalidone 12.5 mg to avoid orthostasis.  I am okay with mild permissive hypertension For systolic pressures in the 140/80 range.      Relevant Medications   amLODipine (NORVASC) 5 MG tablet   Hyperlipidemia LDL goal <70 (Chronic)    Back on statin.  His lipids have not been controlled.  At this point I think we will hold off on being overly aggressive and until we resolve his fatigue.      Relevant Medications   amLODipine (NORVASC) 5 MG tablet   Chronic fatigue    No real difference having converted from beta-blocker to calcium channel blocker.  At this point we will switch from nondihydropyridine to a dihydropyridine calcium channel blocker for better blood pressure control.  We will avoid any rate control agents/AV nodal agents.  Allow for mild permissive hypertension.  Not sure how much of this is related to psychosomatic symptoms with his wife's declining health as well as his declining health.  I think there may be some component of depression.  Defer to PCP.         COVID-19 Education: The signs and symptoms of COVID-19 were discussed with the patient and how to seek care for testing (follow up with PCP or arrange E-visit).   The importance of social distancing was discussed today.  Time:   Today, I have  spent 38 minutes with the patient with telehealth technology discussing the above problems -> it seems like a major portion of his issues are related to his wife's health.  A somewhat therapeutically went through these details with me in a way of explaining his emotional state..  8 min charting: total 46 min   Medication Adjustments/Labs and Tests Ordered: Current medicines are reviewed at length with the patient today.  Concerns regarding medicines are outlined above.   Patient Instructions  Medication Instructions:    Once we get the new prescription for amlodipine -you will stop diltiazem and start amlodipine  And start amlodipine 5 mg daily  Monitor your blood pressure at least once twice a day for 2 weeks - call in to report results  Will start with 1 month supply & 2 refills of Amlodipine until we determine dose.  Rx: Amlodipine 5 mg PO daily; Disp #30 tabs, 2 refills  *If you need a refill on your cardiac medications before your next  appointment, please call your pharmacy*   Lab Work: none    Testing/Procedures: none   Follow-Up: At [facility], you and your health needs are our priority.  As part of our continuing mission to provide you with exceptional heart care, we have created designated Provider Care Teams.  These Care Teams include your primary Cardiologist (physician) and Advanced Practice Providers (APPs -  Physician Assistants and Nurse Practitioners) who all work together to provide you with the care you need, when you need it.     Your next appointment:   5-6 wks week(s)  The format for your next appointment:   In Person  Provider:   Glenetta Hew, MD   Other Instructions In addition to checking BP daily - also record Heart Rate.        Signed, Glenetta Hew, MD  01/17/2020 3:01 PM    [place]

## 2020-01-17 NOTE — Assessment & Plan Note (Signed)
Back on statin.  His lipids have not been controlled.  At this point I think we will hold off on being overly aggressive and until we resolve his fatigue.

## 2020-01-17 NOTE — Assessment & Plan Note (Signed)
No real difference having converted from beta-blocker to calcium channel blocker.  At this point we will switch from nondihydropyridine to a dihydropyridine calcium channel blocker for better blood pressure control.  We will avoid any rate control agents/AV nodal agents.  Allow for mild permissive hypertension.  Not sure how much of this is related to psychosomatic symptoms with his wife's declining health as well as his declining health.  I think there may be some component of depression.  Defer to PCP.

## 2020-01-17 NOTE — Telephone Encounter (Signed)
FIRST ATTEMPT  LEFT MESSAGE TO CALL BACK

## 2020-01-17 NOTE — Telephone Encounter (Signed)
  Patient Consent for Virtual Visit         [name] has provided verbal consent on 01/17/2020 for a virtual visit (video or telephone).   CONSENT FOR VIRTUAL VISIT FOR:  [name]  By participating in this virtual visit I agree to the following:  I hereby voluntarily request, consent and authorize [facility] and its employed or contracted physicians, physician assistants, nurse practitioners or other licensed health care professionals (the Practitioner), to provide me with telemedicine health care services (the "Services") as deemed necessary by the treating Practitioner. I acknowledge and consent to receive the Services by the Practitioner via telemedicine. I understand that the telemedicine visit will involve communicating with the Practitioner through live audiovisual communication technology and the disclosure of certain medical information by electronic transmission. I acknowledge that I have been given the opportunity to request an in-person assessment or other available alternative prior to the telemedicine visit and am voluntarily participating in the telemedicine visit.  I understand that I have the right to withhold or withdraw my consent to the use of telemedicine in the course of my care at any time, without affecting my right to future care or treatment, and that the Practitioner or I may terminate the telemedicine visit at any time. I understand that I have the right to inspect all information obtained and/or recorded in the course of the telemedicine visit and may receive copies of available information for a reasonable fee.  I understand that some of the potential risks of receiving the Services via telemedicine include:  Marland Kitchen Delay or interruption in medical evaluation due to technological equipment failure or disruption; . Information transmitted may not be sufficient (e.g. poor resolution of images) to allow for appropriate medical decision making by the  Practitioner; and/or  . In rare instances, security protocols could fail, causing a breach of personal health information.  Furthermore, I acknowledge that it is my responsibility to provide information about my medical history, conditions and care that is complete and accurate to the best of my ability. I acknowledge that Practitioner's advice, recommendations, and/or decision may be based on factors not within their control, such as incomplete or inaccurate data provided by me or distortions of diagnostic images or specimens that may result from electronic transmissions. I understand that the practice of medicine is not an exact science and that Practitioner makes no warranties or guarantees regarding treatment outcomes. I acknowledge that a copy of this consent can be made available to me via my patient portal ([facility]), or I can request a printed copy by calling the office of [facility].    I understand that my insurance will be billed for this visit.   I have read or had this consent read to me. . I understand the contents of this consent, which adequately explains the benefits and risks of the Services being provided via telemedicine.  . I have been provided ample opportunity to ask questions regarding this consent and the Services and have had my questions answered to my satisfaction. . I give my informed consent for the services to be provided through the use of telemedicine in my medical care

## 2020-01-17 NOTE — Patient Instructions (Addendum)
Medication Instructions:    Once we get the new prescription for amlodipine -you will stop diltiazem and start amlodipine  And start amlodipine 5 mg daily  Monitor your blood pressure at least once twice a day for 2 weeks - call in to report results  Will start with 1 month supply & 2 refills of Amlodipine until we determine dose.  Rx: Amlodipine 5 mg PO daily; Disp #30 tabs, 2 refills  *If you need a refill on your cardiac medications before your next appointment, please call your pharmacy*   Lab Work: none    Testing/Procedures: none   Follow-Up: At [facility], you and your health needs are our priority.  As part of our continuing mission to provide you with exceptional heart care, we have created designated Provider Care Teams.  These Care Teams include your primary Cardiologist (physician) and Advanced Practice Providers (APPs -  Physician Assistants and Nurse Practitioners) who all work together to provide you with the care you need, when you need it.     Your next appointment:   5-6 wks week(s)  The format for your next appointment:   In Person  Provider:   Glenetta Hew, MD   Other Instructions In addition to checking BP daily - also record Heart Rate.

## 2020-01-17 NOTE — Telephone Encounter (Signed)
RN spoke to patient. Instruction were given  from today's virtual visit10/15/21.  AVS SUMMARY has been sent by [facility] and mailed .  Medication sent to pharmacy    Patient verbalized understanding

## 2020-01-17 NOTE — Assessment & Plan Note (Signed)
No further symptoms to suggest A. fib. His heart rates at rest are in the 60s.  I am concerned that maybe his fatigue may be related to just appropriate rate control.  Plan:   Discontinue diltiazem and go back to amlodipine.  Continue on Eliquis

## 2020-02-04 DIAGNOSIS — K611 Rectal abscess: Secondary | ICD-10-CM | POA: Diagnosis not present

## 2020-03-02 ENCOUNTER — Ambulatory Visit (INDEPENDENT_AMBULATORY_CARE_PROVIDER_SITE_OTHER): Payer: Medicare Other | Admitting: Cardiology

## 2020-03-02 ENCOUNTER — Other Ambulatory Visit: Payer: Self-pay

## 2020-03-02 VITALS — BP 99/55 | HR 72 | Temp 97.0°F | Ht 69.0 in | Wt 204.8 lb

## 2020-03-02 DIAGNOSIS — Z7901 Long term (current) use of anticoagulants: Secondary | ICD-10-CM

## 2020-03-02 DIAGNOSIS — E785 Hyperlipidemia, unspecified: Secondary | ICD-10-CM

## 2020-03-02 DIAGNOSIS — R5382 Chronic fatigue, unspecified: Secondary | ICD-10-CM

## 2020-03-02 DIAGNOSIS — I2510 Atherosclerotic heart disease of native coronary artery without angina pectoris: Secondary | ICD-10-CM | POA: Diagnosis not present

## 2020-03-02 DIAGNOSIS — I10 Essential (primary) hypertension: Secondary | ICD-10-CM | POA: Diagnosis not present

## 2020-03-02 DIAGNOSIS — I5189 Other ill-defined heart diseases: Secondary | ICD-10-CM | POA: Diagnosis not present

## 2020-03-02 DIAGNOSIS — I491 Atrial premature depolarization: Secondary | ICD-10-CM | POA: Diagnosis not present

## 2020-03-02 DIAGNOSIS — I480 Paroxysmal atrial fibrillation: Secondary | ICD-10-CM

## 2020-03-02 DIAGNOSIS — Z9861 Coronary angioplasty status: Secondary | ICD-10-CM

## 2020-03-02 MED ORDER — TELMISARTAN 40 MG PO TABS: 40.0000 mg | ORAL_TABLET | Freq: Every day | ORAL | 3 refills | Status: DC

## 2020-03-02 NOTE — Progress Notes (Signed)
Primary Care Provider: Lavone Orn, MD Cardiologist: Glenetta Hew, MD Electrophysiologist: None  Clinic Note: Chief Complaint  Patient presents with  . Follow-up    54-month-blood pressure recheck    HPI:    [name] is a 84 y.o. male with a PMH notable for distant history of CAD-PCI as well as (recent diagnosis) A. fib and OSA on CPAP, as well as significant disability from spinal cord tumor (post resection) who presents today for 49-month follow-up  [name] was last seen in telemedicine on January 17, 2020.  He had restarted his statin indicated there is no real change in symptoms.  I switched him from beta-blocker calcium channel blocker on the previous visit visit concerns of fatigue.  The previous visit in July was a very unusual episode for him where he felt extremely fatigued, --indicated that he felt as though he was " dying".  He had dreams about an episode of dying.  At that time we backed off the diltiazem and held his statin. ->  Was in much better spirits during this visit.  Still extremely fatigued and depressed because according to his wife health situation (status post partial lung resection, now on chemotherapy and radiation for lung cancer).  He feels helpless because he cannot do any to help her feel better.  He says that he is "not cut out to be a caregiver ", but is doing the best he can.  He is just not very good at doing things like helping to bathe her dresser.    During this visit I switched him from diltiazem to amlodipine along with ACE inhibitor.    Without diltiazem or beta-blocker, he was without underlying rate control agent but he was intrinsically rate controlled.  His ACE inhibitor had been converted to Micardis 80 mg along with his chlorthalidone 12.5 mg daily.  Plan was allowed for mild permissive hypertension.  We plan to continue to withhold statin and if no lesions, because of continued fatigue.  Recent Hospitalizations:  None  Reviewed  CV studies:    The following studies were reviewed today: (if available, images/films reviewed: From Epic Chart or Care Everywhere) . None:   Interval History:   [name] returns here today overall doing notably better than he was last I saw him.  His spirits are much improved, but he still is very depressed about his wife's health condition.  He no longer is feeling as though he is dying.  He just feels like he gets tired much easier than he used to.  Certainly is back is still giving him problems.  He does not like having loss of bowel or bladder control.  The dreams of decreased. With the adjustments being made, having stopped the diltiazem his energy levels improved but still was low.  He also feels that he is not getting much sleep.  No medical symptoms chest discomfort or dyspnea with rest or exertion.  Just has poor energy level.  Not really interested in doing any activity or exertion, most of his time spent as a caregiver for his wife.  He still has to use a cane to walk.  Gait is still somewhat unsteady.  No falls.  CV Review of Symptoms (Summary) Cardiovascular ROS: positive for - dyspnea on exertion and Some mild general weakness and fatigue-just for energy level.  This leads him to be short of breath. negative for - chest pain, edema, irregular heartbeat, orthopnea, palpitations, paroxysmal nocturnal dyspnea, rapid heart  rate, shortness of breath or TIA/amaurosis fugax, claudication, syncope/near syncope.  The patient does not have symptoms concerning for COVID-19 infection (fever, chills, cough, or new shortness of breath).  The patient is practicing social distancing & Masking.   COVID-19 vaccine: Completed in February Immunization History  Administered Date(s) Administered  . Influenza,inj,Quad PF,6+ Mos 12/10/2014  . PFIZER SARS-COV-2 Vaccination 04/17/2019, 05/08/2019  . Pneumococcal Polysaccharide-23 07/14/2012    REVIEWED OF SYSTEMS   Review  of Systems  Constitutional: Positive for malaise/fatigue (Still tires very easily.  No energy.  Less activity level.  Does not feel he gets good sleep, partly because he is stressed about his wife). Negative for weight loss.  HENT: Negative for congestion and nosebleeds.   Respiratory: Negative for shortness of breath.   Cardiovascular: Positive for leg swelling (mild).  Gastrointestinal: Positive for heartburn. Negative for blood in stool and melena.       Still has some mild lack of bowel and bladder control.  Genitourinary: Negative for hematuria.  Musculoskeletal: Positive for back pain, falls and joint pain.  Neurological: Positive for dizziness and weakness (Bilateral legs are weak, poor balance). Negative for focal weakness and headaches.  Psychiatric/Behavioral: Negative for memory loss. The patient is not nervous/anxious.        No longer having sense of feeling that he is dying.  He just feels really fatigued and worn out.  He is notably depressed.  He said that the Lexapro is helping some, but probably is not enough.    I have reviewed and (if needed) personally updated the patient's problem list, medications, allergies, past medical and surgical history, social and family history.   PAST MEDICAL HISTORY   Past Medical History:  Diagnosis Date  . Arthritis   . CAD S/P percutaneous coronary angioplasty 08/2004   CARDIOLOGIST-  DR Sedale Jenifer; DES to LCx. X2  - Cypher 2.5 mm postdilated to 2.75 mm (20 mm and 13 mm stents);; Myoview 09/2014: LOW RISK. No Infarct or Ischemia.  . Colon polyps 05/10/2005   Hyperplastic polyps  . Complication of anesthesia    "takes long time to wake up"  . Depression   . Diabetes mellitus type 2 with complications (HCC)    neuropathy; CAD borderline- no med  . Diastolic dysfunction without heart failure    Moderate LVH, Gr 1 DD on Echo 2011; no CHF admissions, no tt on diuretisc  . Essential hypertension   . H/O: gout STABLE  . History of  melanoma excision 2010   FOREHEAD  . History of prostate cancer 2004--  S/P SEED IMPLANTS    NO RECURRENCE  . Hydrocele, left   . Hyperlipidemia LDL goal <70   . Impaired hearing RIGHT HEARING AID  . OSA on CPAP    bipap  . PAF (paroxysmal atrial fibrillation) ([facility]) 12/11/2017   Admitted with Afib RVR - converted on IV Diltiazem. d/c on Eliquis; CHA2DS2Vasc = 5.  . Prostate cancer ([place])    prostate , melanoma head  . Shingles 07  . Syncope and collapse March 2017   Likely related to post micturition, Neurontin and dehydration with orthostatic hypotension  . Urgency of urination     PAST SURGICAL HISTORY   Past Surgical History:  Procedure Laterality Date  . Cardiac Event Monitor  3-07/2015   Mostly SR - 58-132 bpm. Rare PVCs (occ bigeminy), Frequent PACs - singlets, couplets - short runs of PAT.  . circumsision    . CORONARY ANGIOPLASTY WITH STENT PLACEMENT  09-01-2004     Cypher DES x 2 d-m LCx 2.5 mm x 20 mm & 2.5 mm x 13 mm (~2.75 mm)  . EXCISION MELANOMA FROM FOREHEAD  2010  . EYE SURGERY Bilateral    cataracts  . HYDROCELE EXCISION  10/14/2011   Procedure: HYDROCELECTOMY ADULT;  Surgeon: Ailene Rud, MD;  Location: [facility];  Service: Urology;  Laterality: Left;  . LAMINECTOMY N/A 07/13/2012   Procedure: THORACIC LAMINECTOMY FOR RESECTION OF INTRAMEDULLARY SPINAL CHORD TUMOR WITH SPINAL CHORD MONITORING;  Surgeon: Erline Levine, MD;  Location: [facility];  Service: Neurosurgery;  Laterality: N/A;  Thoracic laminectomy for resection of intramedullary spinal cord tumor with spinal cord monitering and Dr. Christella Noa to assist  . LAMINECTOMY N/A 12/09/2014   Procedure: Redo Thoracic laminectomy with intramedullary tumar resection/USN/[place]/Subarachnoid shunt/spinal cord monitoring/Dr. Kathyrn Sheriff to assist;  Surgeon: Erline Levine, MD;  Location: [facility];  Service: Neurosurgery;  Laterality: N/A;  Redo Thoracic laminectomy with intramedullary tumar  resection/USN/[place]/Subarachnoid shunt/spinal cord monitoring/Dr. Kathyrn Sheriff to assist  . NM MYOVIEW LTD  3/212014; June 2016   Both Lexiscan: a. LOW RISK, mild inferior bowel artifact; No ischemia or Infarction; b. Low risk, normal study. No infarction or ischemia. No artifact.  Marland Kitchen PROSTATE PALLADIUM GOLD SEED IMPLANTS (118)  11-15-2002   PROSTATE CANCER  . SKIN BIOPSY    . TONSILLECTOMY    . TRANSTHORACIC ECHOCARDIOGRAM  12/2017   (A. fib): Moderate concentric LVH.  EF 55 to 60%.  No wall motion normality.  Mild to moderately dilated left atrium.    MEDICATIONS/ALLERGIES   Current Meds  Medication Sig  . allopurinol (ZYLOPRIM) 300 MG tablet Take 1 tablet (300 mg total) by mouth daily.  Marland Kitchen amLODipine (NORVASC) 5 MG tablet Take 1 tablet (5 mg total) by mouth daily.  Marland Kitchen apixaban (ELIQUIS) 5 MG TABS tablet Take 1 tablet (5 mg total) by mouth 2 (two) times daily.  . chlorthalidone (HYGROTON) 25 MG tablet Take 0.5 tablets (12.5 mg total) by mouth daily.  Marland Kitchen escitalopram (LEXAPRO) 10 MG tablet Take 1 tablet by mouth daily.  . metFORMIN (GLUCOPHAGE) 500 MG tablet Take 1 tablet (500 mg total) by mouth 2 (two) times daily with a meal.  . ONETOUCH ULTRA test strip USE TO TEST BLOOD SUGAR ONCE OR TWICE A DAY (E11.22)  . oxybutynin (DITROPAN XL) 15 MG 24 hr tablet Take 1 tablet (15 mg total) by mouth at bedtime.  . tamsulosin (FLOMAX) 0.4 MG CAPS capsule Take 1 capsule (0.4 mg total) by mouth at bedtime.  Marland Kitchen VITAMIN D, CHOLECALCIFEROL, PO Take 1 tablet by mouth daily.   . [DISCONTINUED] telmisartan (MICARDIS) 80 MG tablet Take 80 mg by mouth daily.    Allergies  Allergen Reactions  . Betadine [Povidone Iodine] Anaphylaxis  . Contrast Media [Iodinated Diagnostic Agents] Anaphylaxis and Rash    Other Reaction: Other reaction  . Iodine Anaphylaxis    Shellfish, IVP dye. Swelling in throat and foaming at mouth.  . Shellfish Allergy Anaphylaxis    SOCIAL HISTORY/FAMILY HISTORY   Reviewed in Epic:   Pertinent findings: -> Wife has successfully recovered from her lung surgery, but is now on chemotherapy and completing radiation for her lung cancer.  She is very worn out and fatigued.  Is requiring significant amount of support that he is finding hard time doing.  They are not having an issue with food, he affects adequate food and the freezer, the biggest issue is helping her with her ADLs.  Notably difficult to help her with bathing, toileting and dressing.  OBJCTIVE -PE, EKG, labs   Wt Readings from Last 3 Encounters:  03/02/20 204 lb 12.8 oz (92.9 kg)  01/17/20 195 lb (88.5 kg)  10/03/19 201 lb 12.8 oz (91.5 kg)   Physical Exam: BP (!) 99/55   Pulse 72   Temp (!) 97 F (36.1 C)   Ht 5\' 9"  (1.753 m)   Wt 204 lb 12.8 oz (92.9 kg)   SpO2 99%   BMI 30.24 kg/m  Physical Exam Constitutional:      General: He is not in acute distress.    Appearance: Normal appearance. He is obese. He is not ill-appearing.     Comments: Otherwise healthy-appearing gentleman index appears younger than stated age.  HENT:     Head: Normocephalic and atraumatic.  Cardiovascular:     Rate and Rhythm: Normal rate and regular rhythm. Occasional extrasystoles are present.    Chest Wall: PMI is not displaced.     Pulses: Normal pulses.     Heart sounds: Normal heart sounds. No murmur heard. No friction rub. No gallop.   Pulmonary:     Effort: Pulmonary effort is normal. No respiratory distress.     Breath sounds: No wheezing, rhonchi or rales.  Musculoskeletal:        General: Swelling (Minimal bilateral feet) present. Normal range of motion.     Cervical back: Normal range of motion and neck supple.     Comments: Walks with a cane, slow somewhat unsteady gait  Neurological:     General: No focal deficit present.     Mental Status: He is alert and oriented to person, place, and time. Mental status is at baseline.  Psychiatric:        Behavior: Behavior normal.        Thought Content: Thought  content normal.        Judgment: Judgment normal.     Comments: Better spirits today, still appears depressed.  He is actually back making jokes again today which was not something he did last time.      Adult ECG Report Not checked  Recent Labs:    February 2021: TC 251, TG 232, HDL 39, LDL 168.  Hgb 14.3.  A1c 6.0.  CR 1.57.  K+ 3.9. Lab Results  Component Value Date   CHOL 112 04/16/2013   HDL 31 (L) 04/16/2013   LDLCALC 52 04/16/2013   TRIG 143 04/16/2013   CHOLHDL 3.6 04/16/2013   Lab Results  Component Value Date   CREATININE 1.27 (H) 12/11/2017   BUN 20 12/11/2017   NA 141 12/11/2017   K 4.1 12/11/2017   CL 105 12/11/2017   CO2 26 12/11/2017   Lab Results  Component Value Date   TSH 1.834 12/11/2017    ASSESSMENT/PLAN    Problem List Items Addressed This Visit    PAF (paroxysmal atrial fibrillation) ; CHA2DS2-VASc Score =5 (Eliquis) (Chronic)    No further symptoms to suggest that he is in A. fib.  Unfortunately is not overly symptomatic while in A. fib.  Resting heart rates are still relatively low, indicating underlying conduction disease.  We will stop a nodal agents to allow for treatment of fatigue which has done so.  Therefore no beta-blocker or nonhydrating calcium channel blockers. Continue Eliquis with no bleeding issues.      Relevant Medications   telmisartan (MICARDIS) 40 MG tablet   Anticoagulation adequate, Eliquis with CHA2DS2VASc of 5 (Chronic)  Doing well on Eliquis.  No bleeding issues.  Okay to hold Eliquis 24 to 72 hours preop for procedures or surgeries depending on the extent of surgery.    Simple skin procedures or percutaneous procedures relatively marked, GI or other surgical procedures recommend least 48 hours.    Spine or neurosurgery procedures would recommend 72 hours.      Essential hypertension - Primary (Chronic)    Blood pressure is actually low today.  It was little high last visit.  He is now on amlodipine and  Micardis 80 mg along with chlorthalidone.  Plan: Reduce Micardis dose to 1/2 tablet daily.  Monitor for signs of increased blood pressure if so take full dose..        Relevant Medications   telmisartan (MICARDIS) 40 MG tablet   Hyperlipidemia LDL goal <70 (Chronic)    Back off of statin now.  Mostly because of fatigue I wanted to just make sure his symptoms resolved before we attempted to rechallenge.  If this becomes a major issue, would need to consider CVRR consultation to determine the next best treatment option.      Relevant Medications   telmisartan (MICARDIS) 40 MG tablet   Finding of multiple premature atrial contractions by electrocardiography (Chronic)    Probably noted this precursors of A. fib.  Did not have a lot of ectopy on exam today.  Because of fatigue we have stopped all his medications.      Relevant Medications   telmisartan (MICARDIS) 40 MG tablet   Diastolic dysfunction without heart failure (Chronic)    Remains relatively euvolemic.  He is on chlorthalidone now as opposed to furosemide.  Edema seems pretty well controlled.      Chronic fatigue    At this point, I think his fatigue is probably all related to his almost being fully depressed.  He is not sleeping well probably because his home issues but then also is notably caregiver for his wife.  He is taking this very poorly.  This is quite different hands over his age.  Follow-up visit hypertension. Plan: Reduce Micardis to 40 mg daily.          COVID-19 Education: The signs and symptoms of COVID-19 were discussed with the patient and how to seek care for testing (follow up with PCP or arrange E-visit).   The importance of social distancing and COVID-19 vaccination was discussed today.  I spent a total of 40minutes with the patient. >  50% of the time was spent in direct patient consultation.  Additional time spent with chart review  / charting (studies, outside notes, etc): 7 Total Time: 30  min   Current medicines are reviewed at length with the patient today.  (+/- concerns) n/a  Notice: This dictation was prepared with Dragon dictation along with smaller phrase technology. Any transcriptional errors that result from this process are unintentional and may not be corrected upon review.  Patient Instructions / Medication Changes & Studies & Tests Ordered   Patient Instructions  Medication Instructions:   Decrease dose of Micardis to 40 mg daily  *If you need a refill on your cardiac medications before your next appointment, please call your pharmacy*   Lab Work: Not needed    Testing/Procedures: Not needed   Follow-Up: At [facility], you and your health needs are our priority.  As part of our continuing mission to provide you with exceptional heart care, we have created designated Provider Care Teams.  These Care Teams  include your primary Cardiologist (physician) and Advanced Practice Providers (APPs -  Physician Assistants and Nurse Practitioners) who all work together to provide you with the care you need, when you need it.  .    Your next appointment:   4 month(s)  The format for your next appointment:   In Person  Provider:   Glenetta Hew, MD   Other Instructions Discuss with primary about increasing  Medication - Depression    Studies Ordered:   No orders of the defined types were placed in this encounter.    Glenetta Hew, M.D., M.S. Interventional Cardiologist   Pager # [phone] Phone # [phone] [place], [place] [address]   Thank you for choosing Heartcare at [facility]!!

## 2020-03-02 NOTE — Patient Instructions (Signed)
Medication Instructions:   Decrease dose of Micardis to 40 mg daily  *If you need a refill on your cardiac medications before your next appointment, please call your pharmacy*   Lab Work: Not needed    Testing/Procedures: Not needed   Follow-Up: At [facility], you and your health needs are our priority.  As part of our continuing mission to provide you with exceptional heart care, we have created designated Provider Care Teams.  These Care Teams include your primary Cardiologist (physician) and Advanced Practice Providers (APPs -  Physician Assistants and Nurse Practitioners) who all work together to provide you with the care you need, when you need it.  .    Your next appointment:   4 month(s)  The format for your next appointment:   In Person  Provider:   Glenetta Hew, MD   Other Instructions Discuss with primary about increasing  Medication - Depression

## 2020-03-09 DIAGNOSIS — K649 Unspecified hemorrhoids: Secondary | ICD-10-CM | POA: Diagnosis not present

## 2020-03-13 ENCOUNTER — Encounter: Payer: Self-pay | Admitting: Cardiology

## 2020-03-13 NOTE — Assessment & Plan Note (Signed)
Blood pressure is actually low today.  It was little high last visit.  He is now on amlodipine and Micardis 80 mg along with chlorthalidone.  Plan: Reduce Micardis dose to 1/2 tablet daily.  Monitor for signs of increased blood pressure if so take full dose.Marland Kitchen

## 2020-03-13 NOTE — Assessment & Plan Note (Signed)
Doing well on Eliquis.  No bleeding issues.  Okay to hold Eliquis 24 to 72 hours preop for procedures or surgeries depending on the extent of surgery.    Simple skin procedures or percutaneous procedures relatively marked, GI or other surgical procedures recommend least 48 hours.    Spine or neurosurgery procedures would recommend 72 hours.

## 2020-03-13 NOTE — Assessment & Plan Note (Addendum)
Back off of statin now.  Mostly because of fatigue I wanted to just make sure his symptoms resolved before we attempted to rechallenge.  If this becomes a major issue, would need to consider CVRR consultation to determine the next best treatment option.

## 2020-03-13 NOTE — Assessment & Plan Note (Signed)
Remains relatively euvolemic.  He is on chlorthalidone now as opposed to furosemide.  Edema seems pretty well controlled.

## 2020-03-13 NOTE — Assessment & Plan Note (Signed)
Probably noted this precursors of A. fib.  Did not have a lot of ectopy on exam today.  Because of fatigue we have stopped all his medications.

## 2020-03-13 NOTE — Assessment & Plan Note (Signed)
At this point, I think his fatigue is probably all related to his almost being fully depressed.  He is not sleeping well probably because his home issues but then also is notably caregiver for his wife.  He is taking this very poorly.  This is quite different hands over his age.  Follow-up visit hypertension. Plan: Reduce Micardis to 40 mg daily.

## 2020-03-13 NOTE — Assessment & Plan Note (Signed)
No further symptoms to suggest that he is in A. fib.  Unfortunately is not overly symptomatic while in A. fib.  Resting heart rates are still relatively low, indicating underlying conduction disease.  We will stop a nodal agents to allow for treatment of fatigue which has done so.  Therefore no beta-blocker or nonhydrating calcium channel blockers. Continue Eliquis with no bleeding issues.

## 2020-03-23 ENCOUNTER — Telehealth: Payer: Self-pay | Admitting: Cardiology

## 2020-03-23 NOTE — Telephone Encounter (Signed)
Pt c/o medication issue:  1. Name of Medication: escitalopram (LEXAPRO) 10 MG tablet    2. How are you currently taking this medication (dosage and times per day)? Usually takes 20mg  every morning.  3. Are you having a reaction (difficulty breathing--STAT)? No.  4. What is your medication issue? Patient received his prescription from his pharmacy and realized it was escitalopram (LEXAPRO) 10 MG tablet instead of the normal escitalopram (LEXAPRO) 20 MG tablet that he is use to getting. Pt wants a list of his medication emailed to him at, [email or web address]. Please advise.

## 2020-03-24 NOTE — Telephone Encounter (Signed)
Left message on patient 's cell voicemail to call back -

## 2020-03-24 NOTE — Telephone Encounter (Signed)
Follow up  Pt returning call to [facility]  Pt transferred to [facility]

## 2020-03-24 NOTE — Telephone Encounter (Signed)
Spoke to patient. Inform patient will scan medication list  To e-mail given .

## 2020-04-01 DIAGNOSIS — E782 Mixed hyperlipidemia: Secondary | ICD-10-CM | POA: Diagnosis not present

## 2020-04-01 DIAGNOSIS — N182 Chronic kidney disease, stage 2 (mild): Secondary | ICD-10-CM | POA: Diagnosis not present

## 2020-04-01 DIAGNOSIS — E1122 Type 2 diabetes mellitus with diabetic chronic kidney disease: Secondary | ICD-10-CM | POA: Diagnosis not present

## 2020-04-01 DIAGNOSIS — I701 Atherosclerosis of renal artery: Secondary | ICD-10-CM | POA: Diagnosis not present

## 2020-04-01 DIAGNOSIS — Z8546 Personal history of malignant neoplasm of prostate: Secondary | ICD-10-CM | POA: Diagnosis not present

## 2020-04-01 DIAGNOSIS — F325 Major depressive disorder, single episode, in full remission: Secondary | ICD-10-CM | POA: Diagnosis not present

## 2020-04-01 DIAGNOSIS — I480 Paroxysmal atrial fibrillation: Secondary | ICD-10-CM | POA: Diagnosis not present

## 2020-04-01 DIAGNOSIS — I10 Essential (primary) hypertension: Secondary | ICD-10-CM | POA: Diagnosis not present

## 2020-04-01 DIAGNOSIS — I2510 Atherosclerotic heart disease of native coronary artery without angina pectoris: Secondary | ICD-10-CM | POA: Diagnosis not present

## 2020-04-08 ENCOUNTER — Telehealth: Payer: Self-pay | Admitting: Cardiology

## 2020-04-08 NOTE — Telephone Encounter (Signed)
Patient states he is switching insurance companies to [employer] and they are requesting a list of all medications the patient is currently taking to cover future refills. Patient would like for [place] to contact [place] to provide the list. Patient requested to have a confirmation e-mail sent to him at [email or web address], including the list of medications.  [place]: [id number]  Phone #- [phone]

## 2020-04-09 NOTE — Telephone Encounter (Signed)
Med List  Current Medications as of 04/09/2020  9:51 AM   Outpatient Medications   Quantity Refills Start End  allopurinol (ZYLOPRIM) 300 MG tablet 30 tablet 1 12/25/2014   Sig:   Take 1 tablet (300 mg total) by mouth daily.    Route:   Oral    Class:   Print    amLODipine (NORVASC) 5 MG tablet 30 tablet 2 01/17/2020 04/16/2020  Sig:   Take 1 tablet (5 mg total) by mouth daily.    Route:   Oral    Note to Pharmacy:   Discontinue diltiazem    apixaban (ELIQUIS) 5 MG TABS tablet 180 tablet 3 09/18/2018   Sig:   Take 1 tablet (5 mg total) by mouth 2 (two) times daily.    Route:   Oral    chlorthalidone (HYGROTON) 25 MG tablet 45 tablet 3 10/03/2019   Sig:   Take 0.5 tablets (12.5 mg total) by mouth daily.    Route:   Oral    Note to Pharmacy:   Change prescription    escitalopram (LEXAPRO) 10 MG tablet   04/09/2018   Sig:   Take 1 tablet by mouth daily.    Route:   Oral    Class:   Historical Med    metFORMIN (GLUCOPHAGE) 500 MG tablet   12/12/2017   Sig:   Take 1 tablet (500 mg total) by mouth 2 (two) times daily with a meal.    Route:   Oral    Class:   No Print    ONETOUCH ULTRA test strip   06/27/2019   Sig:   USE TO TEST BLOOD SUGAR ONCE OR TWICE A DAY (E11.22)    Route:   (none)    DAW:   Yes    Class:   Historical Med    oxybutynin (DITROPAN XL) 15 MG 24 hr tablet 30 tablet 1 12/25/2014   Sig:   Take 1 tablet (15 mg total) by mouth at bedtime.    Route:   Oral    Class:   Print    tamsulosin (FLOMAX) 0.4 MG CAPS capsule 30 capsule 1 12/25/2014   Sig:   Take 1 capsule (0.4 mg total) by mouth at bedtime.    Route:   Oral    Class:   Print    telmisartan (MICARDIS) 40 MG tablet 90 tablet 3 03/02/2020   Sig:   Take 1 tablet (40 mg total) by mouth daily.    Route:   Oral    Note to Pharmacy:   Discontinue 80 mg daily    VITAMIN D, CHOLECALCIFEROL, PO      Sig:   Take 1 tablet by mouth daily.     Route:   Oral    Class:   Historical Med

## 2020-04-22 ENCOUNTER — Telehealth: Payer: Self-pay | Admitting: Cardiovascular Disease

## 2020-04-22 NOTE — Telephone Encounter (Signed)
Message sent to sleep coordinator  

## 2020-04-22 NOTE — Telephone Encounter (Signed)
Patient's wife states the patient would like to switch sleep doctors to Dr. Halford Chessman, because she sees him and it would be more convenient. She states he wants to keep seeing Dr. Ellyn Hack for cardiology. Fax: [phone]

## 2020-05-04 DIAGNOSIS — Z8546 Personal history of malignant neoplasm of prostate: Secondary | ICD-10-CM | POA: Diagnosis not present

## 2020-05-04 DIAGNOSIS — E782 Mixed hyperlipidemia: Secondary | ICD-10-CM | POA: Diagnosis not present

## 2020-05-04 DIAGNOSIS — N1831 Chronic kidney disease, stage 3a: Secondary | ICD-10-CM | POA: Diagnosis not present

## 2020-05-04 DIAGNOSIS — F325 Major depressive disorder, single episode, in full remission: Secondary | ICD-10-CM | POA: Diagnosis not present

## 2020-05-04 DIAGNOSIS — I2510 Atherosclerotic heart disease of native coronary artery without angina pectoris: Secondary | ICD-10-CM | POA: Diagnosis not present

## 2020-05-04 DIAGNOSIS — E1122 Type 2 diabetes mellitus with diabetic chronic kidney disease: Secondary | ICD-10-CM | POA: Diagnosis not present

## 2020-05-04 DIAGNOSIS — I10 Essential (primary) hypertension: Secondary | ICD-10-CM | POA: Diagnosis not present

## 2020-05-04 DIAGNOSIS — I701 Atherosclerosis of renal artery: Secondary | ICD-10-CM | POA: Diagnosis not present

## 2020-05-04 DIAGNOSIS — I480 Paroxysmal atrial fibrillation: Secondary | ICD-10-CM | POA: Diagnosis not present

## 2020-05-07 DIAGNOSIS — N1831 Chronic kidney disease, stage 3a: Secondary | ICD-10-CM | POA: Diagnosis not present

## 2020-05-07 DIAGNOSIS — I480 Paroxysmal atrial fibrillation: Secondary | ICD-10-CM | POA: Diagnosis not present

## 2020-05-07 DIAGNOSIS — N3281 Overactive bladder: Secondary | ICD-10-CM | POA: Diagnosis not present

## 2020-05-07 DIAGNOSIS — E782 Mixed hyperlipidemia: Secondary | ICD-10-CM | POA: Diagnosis not present

## 2020-05-07 DIAGNOSIS — G4733 Obstructive sleep apnea (adult) (pediatric): Secondary | ICD-10-CM | POA: Diagnosis not present

## 2020-05-07 DIAGNOSIS — N179 Acute kidney failure, unspecified: Secondary | ICD-10-CM | POA: Diagnosis not present

## 2020-05-07 DIAGNOSIS — Z0000 Encounter for general adult medical examination without abnormal findings: Secondary | ICD-10-CM | POA: Diagnosis not present

## 2020-05-07 DIAGNOSIS — Z1389 Encounter for screening for other disorder: Secondary | ICD-10-CM | POA: Diagnosis not present

## 2020-05-07 DIAGNOSIS — F324 Major depressive disorder, single episode, in partial remission: Secondary | ICD-10-CM | POA: Diagnosis not present

## 2020-05-07 DIAGNOSIS — I10 Essential (primary) hypertension: Secondary | ICD-10-CM | POA: Diagnosis not present

## 2020-05-07 DIAGNOSIS — E1122 Type 2 diabetes mellitus with diabetic chronic kidney disease: Secondary | ICD-10-CM | POA: Diagnosis not present

## 2020-05-07 DIAGNOSIS — R413 Other amnesia: Secondary | ICD-10-CM | POA: Diagnosis not present

## 2020-05-07 DIAGNOSIS — M109 Gout, unspecified: Secondary | ICD-10-CM | POA: Diagnosis not present

## 2020-05-18 DIAGNOSIS — N179 Acute kidney failure, unspecified: Secondary | ICD-10-CM | POA: Diagnosis not present

## 2020-05-20 DIAGNOSIS — K649 Unspecified hemorrhoids: Secondary | ICD-10-CM | POA: Diagnosis not present

## 2020-05-22 ENCOUNTER — Telehealth: Payer: Self-pay | Admitting: Cardiovascular Disease

## 2020-05-22 ENCOUNTER — Other Ambulatory Visit: Payer: Self-pay

## 2020-05-22 ENCOUNTER — Telehealth: Payer: Self-pay | Admitting: Pulmonary Disease

## 2020-05-22 DIAGNOSIS — G4733 Obstructive sleep apnea (adult) (pediatric): Secondary | ICD-10-CM

## 2020-05-22 NOTE — Telephone Encounter (Signed)
Per Solmon Ice at [employer], pt filled out release of medical records form 05/21/20 for Dr Evette Georges office to release medical records to Dr Halford Chessman. Dr Claiborne Billings and Dr Halford Chessman are both in [place] and should see each other's notes. Please advise. Pt wife very upset because she was told she needed to fill this form out and sign it, at both our office and Dr Barbee Cough and that we should have known that Halford Chessman and Dr Claiborne Billings were both in [place]. Please call pt/wife back. [phone]

## 2020-05-22 NOTE — Telephone Encounter (Signed)
Called spoke with patient and wife.  She was very confused as what to do. She was told by Dr. Evette Georges office to obtain a medical release. This is also stated in a phone note from Dr. Evette Georges office sleep coordinator.  Patient just needs a referral to our office placed in epic and appointment can be made. Notes from both providers are in epic.

## 2020-05-22 NOTE — Telephone Encounter (Signed)
Called patient spoke to wife, advised that I had sent over referral.  Patient wife verbalized understanding.

## 2020-05-22 NOTE — Telephone Encounter (Signed)
Wife of the patient called. Patient needs a referral from Dr. Claiborne Billings to be sent to Dr. Halford Chessman at [facility].  Patient wants to see the same person his Wife sees. Please put referral in Epic

## 2020-06-02 DIAGNOSIS — N179 Acute kidney failure, unspecified: Secondary | ICD-10-CM | POA: Diagnosis not present

## 2020-06-24 ENCOUNTER — Other Ambulatory Visit: Payer: Self-pay

## 2020-06-24 ENCOUNTER — Ambulatory Visit (INDEPENDENT_AMBULATORY_CARE_PROVIDER_SITE_OTHER): Payer: Medicare Other | Admitting: Cardiology

## 2020-06-24 ENCOUNTER — Encounter: Payer: Self-pay | Admitting: Cardiology

## 2020-06-24 VITALS — BP 168/86 | HR 55 | Ht 69.0 in | Wt 205.0 lb

## 2020-06-24 DIAGNOSIS — I5189 Other ill-defined heart diseases: Secondary | ICD-10-CM | POA: Diagnosis not present

## 2020-06-24 DIAGNOSIS — R072 Precordial pain: Secondary | ICD-10-CM | POA: Diagnosis not present

## 2020-06-24 DIAGNOSIS — R079 Chest pain, unspecified: Secondary | ICD-10-CM

## 2020-06-24 DIAGNOSIS — Z9861 Coronary angioplasty status: Secondary | ICD-10-CM | POA: Diagnosis not present

## 2020-06-24 DIAGNOSIS — I2510 Atherosclerotic heart disease of native coronary artery without angina pectoris: Secondary | ICD-10-CM

## 2020-06-24 DIAGNOSIS — I480 Paroxysmal atrial fibrillation: Secondary | ICD-10-CM | POA: Diagnosis not present

## 2020-06-24 DIAGNOSIS — E785 Hyperlipidemia, unspecified: Secondary | ICD-10-CM

## 2020-06-24 DIAGNOSIS — I10 Essential (primary) hypertension: Secondary | ICD-10-CM

## 2020-06-24 DIAGNOSIS — Z7901 Long term (current) use of anticoagulants: Secondary | ICD-10-CM

## 2020-06-24 NOTE — Patient Instructions (Addendum)
Medication Instructions:  No Changes In Medications at this time.  *If you need a refill on your cardiac medications before your next appointment, please call your pharmacy*  Testing/Procedures: Your physician has requested that you have a lexiscan myoview. For further information please visit [email or web address]. Please follow instruction sheet, as given.This will take place at [address]  SOMEONE WILL CONTACT YOU TO SCHEDULE THIS   The test will take approximately 3 to 4 hours to complete; you may bring reading material.  If someone comes with you to your appointment, they will need to remain in the main lobby due to limited space in the testing area.    How to prepare for your Myocardial Perfusion Test:  Do not eat or drink 3 hours prior to your test, except you may have water.  Do not consume products containing caffeine (regular or decaffeinated) 12 hours prior to your test. (ex: coffee, chocolate, sodas, tea).  Do wear comfortable clothes (no dresses or overalls) and walking shoes, tennis shoes preferred (No heels or open toe shoes are allowed).  Do NOT wear cologne, perfume, aftershave, or lotions (deodorant is allowed).  If you use an inhaler, use it the AM of your test and bring it with you.   If you use a nebulizer, use it the AM of your test.   If these instructions are not followed, your test will have to be rescheduled.  Follow-Up: At [facility], you and your health needs are our priority.  As part of our continuing mission to provide you with exceptional heart care, we have created designated Provider Care Teams.  These Care Teams include your primary Cardiologist (physician) and Advanced Practice Providers (APPs -  Physician Assistants and Nurse Practitioners) who all work together to provide you with the care you need, when you need it.  Your next appointment:   6 month(s)  The format for your next appointment:   In Person  Provider:   Glenetta Hew, MD

## 2020-06-24 NOTE — Progress Notes (Signed)
Primary Care Provider: Lavone Orn, MD Cardiologist: Glenetta Hew, MD Electrophysiologist: None  Clinic Note: Chief Complaint  Patient presents with  . Follow-up    Starting to note some chest pains  . Coronary Artery Disease    Distant history of PCI; now having some chest pain.  He is not sure if it is his imagination or if he may be having issues like his symptoms prior to his previous PCI.    ===================================  ASSESSMENT/PLAN   Problem List Items Addressed This Visit    Chest pain of uncertain etiology    Unfortunately, he is not really active.  Not sure if he be active walking as enough to potentially cause exertional symptoms, but he is also now noting some resting symptoms.  For his peace of mind with amount of stress that he is under, and the fact that he has not had a check in 6 years, we can then recheck a Lexiscan Myoview.  See consent documentation below.      CAD S/P percutaneous coronary angioplasty: Cypher DES x2 to circumflex - Primary (Chronic)    Distant history of Cypher DES x2 to the LCx.  Has been doing very well since 2006.  His most recent Myoview was in June 2016.  Relatively normal.  He is concerned I think with amount of stress and anxiety is gone his life, we need to exclude ischemic CAD.  He has not complained of symptoms like this in a while.  Plan: Continue on amlodipine for antianginal blood pressure effect.  He is on rosuvastatin along with ARB.Marland Kitchen  Not currently on antiplatelet agent because of Eliquis.      Relevant Medications   diltiazem (CARDIZEM CD) 240 MG 24 hr capsule   rosuvastatin (CRESTOR) 20 MG tablet   Other Relevant Orders   EKG 12-Lead (Completed)   PAF (paroxysmal atrial fibrillation) ; CHA2DS2-VASc Score =5 (Eliquis) (Chronic)    Unfortunate he is relatively asymptomatic when he is or is not in A. fib.  As such, we have not been on tell how much he does go into A. fib.  He is on Eliquis for  anticoagulation, but with resting bradycardia, does not require either not hydrating calcium channel blocker or beta-blocker for rate control.  Continue Eliquis -he denies any sort of bleeding issues.      Relevant Medications   diltiazem (CARDIZEM CD) 240 MG 24 hr capsule   rosuvastatin (CRESTOR) 20 MG tablet   Other Relevant Orders   EKG 12-Lead (Completed)   Anticoagulation adequate, Eliquis with CHA2DS2VASc of 5 (Chronic)    Significant elevated chads vas score.  Maintaining on Eliquis. Okay to hold Eliquis 24 to 72 hours preop for surgery procedures:    Skin, percutaneous procedures or minor GI/Or dental procedures can hold 48 hours,  Spinal or neurosurgery procedures hold 72 hours.       Essential hypertension (Chronic)    His blood pressure is high again today.  I am asked that he can monitor his pressures at home.  My inclination will be to probably increase either the ARB or amlodipine depending on results of his stress test.  He had previously on chlorthalidone and has been stopped.  His ARB dose was also reduced back in December.  Seem to be yoyoing with his blood pressures.      Relevant Medications   diltiazem (CARDIZEM CD) 240 MG 24 hr capsule   rosuvastatin (CRESTOR) 20 MG tablet   Other Relevant Orders  EKG 12-Lead (Completed)   Hyperlipidemia LDL goal <70 (Chronic)   Relevant Medications   diltiazem (CARDIZEM CD) 240 MG 24 hr capsule   rosuvastatin (CRESTOR) 20 MG tablet   Other Relevant Orders   EKG 12-Lead (Completed)   Diastolic dysfunction without heart failure (Chronic)    He is a relatively euvolemic.  Not on diuretic.  He has mild swelling that goes down when he puts his feet up.  For now we will hold off on diuretic      Relevant Orders   EKG 12-Lead (Completed)    Other Visit Diagnoses    Precordial pain       Relevant Orders   MYOCARDIAL PERFUSION IMAGING      ===================================  HPI:    [name] is a 85 y.o.  male with a PMH notable for distant history of CAD-PCI as well as (recent diagnosis) A. Fib, along with OSA on CPAP, as well as significant disability from spinal cord tumor (post resection) below who presents today for 4 month f/u.   01/17/2020: -> He has restarted his statin.;  BP meds changed from diltiazem to amlodipine; and ACE inhibitor converted to Micardis. ;  Plan was to continue without rate control for A. fib, hoping to improve energy level  [name] was last seen on March 02, 2020: He is doing quite well.  Still very depressed about his wife health condition.  He was feeling very unhelpful as far as caring for her, and was not good at doing her part of the chores along with caring for her.  He is concerned because she is so weak & has been making poor decisions - poor memory etc. --> He did note poor energy level & still has pretty significant gait instability which limits his activity level - my be construed as DOE.  Recent Hospitalizations: n/a  Reviewed  CV studies:    The following studies were reviewed today: (if available, images/films reviewed: From Epic Chart or Care Everywhere) . none:  Interval History:   [name] presents today overall doing fairly well, but does note having more frequent L side CP symptoms -- described as sharp pin-prick like symptoms that can happen with or without exertion -but do seem to get worse with exertion.  I has him concerned enough that he has actually taken NTG to see if it will help.  Not necessarily associated with dyspnea or other symptoms.  Energy level did improve some extent after stopping beta-blocker and then even more so after finally stopping rate control with diltiazem as well.  He is no longer on rate control.  Rates have been stable.  Otherwise, he seems to be relatively stable.  No sensation of A. fib  CV Review of Symptoms (Summary): positive for - chest pain, dyspnea on exertion and Exertional dyspnea because  of deconditioning poor balance. negative for - edema, irregular heartbeat, orthopnea, palpitations, paroxysmal nocturnal dyspnea, rapid heart rate, shortness of breath or Medicine dizziness with vertigo and poor balance, he has no lightheadedness or near syncope/syncope, TIA/amaurosis fugax, claudication  The patient does not have symptoms concerning for COVID-19 infection (fever, chills, cough, or new shortness of breath).   REVIEWED OF SYSTEMS   Review of Systems  Constitutional: Positive for malaise/fatigue (Overall energy is better, but still tired because he is not sleeping well.). Negative for diaphoresis.  HENT: Negative for congestion.   Respiratory: Negative for cough and shortness of breath.   Cardiovascular: Positive for leg  swelling (Mild). Negative for palpitations.  Gastrointestinal: Negative for blood in stool and melena.  Genitourinary: Negative for hematuria.  Musculoskeletal: Positive for back pain. Negative for falls and joint pain.  Neurological: Positive for weakness (Bilateral legs). Negative for dizziness (Just bad balance).  Psychiatric/Behavioral: Positive for depression (He feels helpless when he comes his wife.  This has been very upsetting to him.). The patient has insomnia (Not sleeping because he is stressed about caring for his wife.).    I have reviewed and (if needed) personally updated the patient's problem list, medications, allergies, past medical and surgical history, social and family history.   PAST MEDICAL HISTORY   Past Medical History:  Diagnosis Date  . Arthritis   . CAD S/P percutaneous coronary angioplasty 08/2004   CARDIOLOGIST-  DR Rahman Ferrall; DES to LCx. X2  - Cypher 2.5 mm postdilated to 2.75 mm (20 mm and 13 mm stents);; Myoview 09/2014: LOW RISK. No Infarct or Ischemia.  . Colon polyps 05/10/2005   Hyperplastic polyps  . Complication of anesthesia    "takes long time to wake up"  . Depression   . Diabetes mellitus type 2 with  complications (HCC)    neuropathy; CAD borderline- no med  . Diastolic dysfunction without heart failure    Moderate LVH, Gr 1 DD on Echo 2011; no CHF admissions, no tt on diuretisc  . Essential hypertension   . H/O: gout STABLE  . History of melanoma excision 2010   FOREHEAD  . History of prostate cancer 2004--  S/P SEED IMPLANTS    NO RECURRENCE  . Hydrocele, left   . Hyperlipidemia LDL goal <70   . Impaired hearing RIGHT HEARING AID  . OSA on CPAP    bipap  . PAF (paroxysmal atrial fibrillation) ([place]) 12/11/2017   Admitted with Afib RVR - converted on IV Diltiazem. d/c on Eliquis; CHA2DS2Vasc = 5.  . Prostate cancer ([place])    prostate , melanoma head  . Shingles 07  . Syncope and collapse March 2017   Likely related to post micturition, Neurontin and dehydration with orthostatic hypotension  . Urgency of urination     PAST SURGICAL HISTORY   Past Surgical History:  Procedure Laterality Date  . Cardiac Event Monitor  3-07/2015   Mostly SR - 58-132 bpm. Rare PVCs (occ bigeminy), Frequent PACs - singlets, couplets - short runs of PAT.  . circumsision    . CORONARY ANGIOPLASTY WITH STENT PLACEMENT  09-01-2004     Cypher DES x 2 d-m LCx 2.5 mm x 20 mm & 2.5 mm x 13 mm (~2.75 mm)  . EXCISION MELANOMA FROM FOREHEAD  2010  . EYE SURGERY Bilateral    cataracts  . HYDROCELE EXCISION  10/14/2011   Procedure: HYDROCELECTOMY ADULT;  Surgeon: Ailene Rud, MD;  Location: [facility];  Service: Urology;  Laterality: Left;  . LAMINECTOMY N/A 07/13/2012   Procedure: THORACIC LAMINECTOMY FOR RESECTION OF INTRAMEDULLARY SPINAL CHORD TUMOR WITH SPINAL CHORD MONITORING;  Surgeon: Erline Levine, MD;  Location: [place] [facility];  Service: Neurosurgery;  Laterality: N/A;  Thoracic laminectomy for resection of intramedullary spinal cord tumor with spinal cord monitering and Dr. Christella Noa to assist  . LAMINECTOMY N/A 12/09/2014   Procedure: Redo Thoracic laminectomy with intramedullary  tumar resection/USN/[place]/Subarachnoid shunt/spinal cord monitoring/Dr. Kathyrn Sheriff to assist;  Surgeon: Erline Levine, MD;  Location: [place] [facility];  Service: Neurosurgery;  Laterality: N/A;  Redo Thoracic laminectomy with intramedullary tumar resection/USN/[place]/Subarachnoid shunt/spinal cord monitoring/Dr. Kathyrn Sheriff to assist  .  NM MYOVIEW LTD  3/212014; June 2016   Both Lexiscan: a. LOW RISK, mild inferior bowel artifact; No ischemia or Infarction; b. Low risk, normal study. No infarction or ischemia. No artifact.  Marland Kitchen PROSTATE PALLADIUM GOLD SEED IMPLANTS (118)  11-15-2002   PROSTATE CANCER  . SKIN BIOPSY    . TONSILLECTOMY    . TRANSTHORACIC ECHOCARDIOGRAM  12/2017   (A. fib): Moderate concentric LVH.  EF 55 to 60%.  No wall motion normality.  Mild to moderately dilated left atrium.    Immunization History  Administered Date(s) Administered  . Influenza,inj,Quad PF,6+ Mos 12/10/2014  . PFIZER(Purple Top)SARS-COV-2 Vaccination 04/17/2019, 05/08/2019  . Pneumococcal Polysaccharide-23 07/14/2012    MEDICATIONS/ALLERGIES   Current Meds  Medication Sig  . allopurinol (ZYLOPRIM) 300 MG tablet Take 1 tablet (300 mg total) by mouth daily.  Marland Kitchen amLODipine (NORVASC) 5 MG tablet Take 1 tablet (5 mg total) by mouth daily.  Marland Kitchen apixaban (ELIQUIS) 5 MG TABS tablet Take 1 tablet (5 mg total) by mouth 2 (two) times daily.  Marland Kitchen diltiazem (CARDIZEM CD) 240 MG 24 hr capsule TAKE 1 CAPSULE BY MOUTH EVERY DAY FOR 90 DAYS  . escitalopram (LEXAPRO) 10 MG tablet Take 1 tablet by mouth daily.  . metFORMIN (GLUCOPHAGE) 500 MG tablet Take 1 tablet (500 mg total) by mouth 2 (two) times daily with a meal.  . ONETOUCH ULTRA test strip USE TO TEST BLOOD SUGAR ONCE OR TWICE A DAY (E11.22)  . oxybutynin (DITROPAN XL) 15 MG 24 hr tablet Take 1 tablet (15 mg total) by mouth at bedtime.  . rosuvastatin (CRESTOR) 20 MG tablet Take 20 mg by mouth at bedtime.  . tamsulosin (FLOMAX) 0.4 MG CAPS capsule Take 1 capsule (0.4 mg total)  by mouth at bedtime.  Marland Kitchen telmisartan (MICARDIS) 40 MG tablet Take 1 tablet (40 mg total) by mouth daily.  Marland Kitchen VITAMIN D, CHOLECALCIFEROL, PO Take 1 tablet by mouth daily.   . [DISCONTINUED] chlorthalidone (HYGROTON) 25 MG tablet Take 0.5 tablets (12.5 mg total) by mouth daily.    Allergies  Allergen Reactions  . Betadine [Povidone Iodine] Anaphylaxis  . Contrast Media [Iodinated Diagnostic Agents] Anaphylaxis and Rash    Other Reaction: Other reaction  . Iodine Anaphylaxis    Shellfish, IVP dye. Swelling in throat and foaming at mouth.  . Shellfish Allergy Anaphylaxis    SOCIAL HISTORY/FAMILY HISTORY   Reviewed in Epic:  Pertinent findings:  Social History   Tobacco Use  . Smoking status: Never Smoker  . Smokeless tobacco: Never Used  . Tobacco comment: occ alcohol  Vaping Use  . Vaping Use: Never used  Substance Use Topics  . Alcohol use: Yes    Comment: OCCASIONAL  . Drug use: No   Social History   Social History Narrative   He is married, father of 2, grandfather of 74. Does not really get exercise now because   of his back issues. Does not smoke and drinks social alcohol. Works in [employer].    OBJCTIVE -PE, EKG, labs   Wt Readings from Last 3 Encounters:  06/24/20 205 lb (93 kg)  03/02/20 204 lb 12.8 oz (92.9 kg)  01/17/20 195 lb (88.5 kg)    Physical Exam: BP (!) 168/86 (BP Location: Left Arm, Patient Position: Sitting, Cuff Size: Normal)   Pulse (!) 55   Ht 5\' 9"  (1.753 m)   Wt 205 lb (93 kg)   SpO2 97%   BMI 30.27 kg/m  Physical Exam Constitutional:  General: He is not in acute distress.    Appearance: Normal appearance. He is obese. He is not ill-appearing or toxic-appearing.  HENT:     Head: Normocephalic and atraumatic.  Cardiovascular:     Rate and Rhythm: Regular rhythm. Bradycardia present.     Pulses: Normal pulses.     Heart sounds: Normal heart sounds. No friction rub. No gallop.   Pulmonary:     Effort: Pulmonary effort is  normal. No respiratory distress.     Breath sounds: Normal breath sounds.  Chest:     Chest wall: No tenderness.  Musculoskeletal:        General: Swelling (Trivial BLE) present. Normal range of motion.     Cervical back: Normal range of motion and neck supple.  Skin:    General: Skin is warm and dry.  Neurological:     General: No focal deficit present.     Mental Status: He is alert and oriented to person, place, and time.     Coordination: Coordination abnormal (Legs are somewhat jittery and wobbly with walking.).     Gait: Gait abnormal (Very unsteady wobbly gait, walks with a cane.  Wide-based).  Psychiatric:        Behavior: Behavior normal.        Thought Content: Thought content normal.        Judgment: Judgment normal.     Comments: His mood actually seems to be better today.  He says that he is just depressed, but he seems to be in better spirits today than before.      Adult ECG Report  Rate: 55 ;  Rhythm: sinus bradycardia and 1  AVB, left axis deviation, nonspecific ST and T wave changes.;   Narrative Interpretation: Stable  Recent Labs:    05/07/2020: TC 135, TG 148, HDL 44, LDL 45.  A1c 6.2, glucose 97.  BUN 38/creatinine 2.02 ==================================================  COVID-19 Education: The signs and symptoms of COVID-19 were discussed with the patient and how to seek care for testing (follow up with PCP or arrange E-visit).   The importance of social distancing and COVID-19 vaccination was discussed today. The patient is practicing social distancing & Masking.   I spent a total of 30 minutes with the patient spent in direct patient consultation.  Additional time spent with chart review  / charting (studies, outside notes, etc): 16 min Total Time: 46 min   Current medicines are reviewed at length with the patient today.  (+/- concerns) N/A  This visit occurred during the SARS-CoV-2 public health emergency.  Safety protocols were in place, including  screening questions prior to the visit, additional usage of staff PPE, and extensive cleaning of exam room while observing appropriate contact time as indicated for disinfecting solutions.  Notice: This dictation was prepared with Dragon dictation along with smaller phrase technology. Any transcriptional errors that result from this process are unintentional and may not be corrected upon review.  Patient Instructions / Medication Changes & Studies & Tests Ordered   Patient Instructions  Medication Instructions:  No Changes In Medications at this time.  *If you need a refill on your cardiac medications before your next appointment, please call your pharmacy*  Testing/Procedures: Your physician has requested that you have a lexiscan myoview. For further information please visit [email or web address]. Please follow instruction sheet, as given.This will take place at [address]  SOMEONE WILL CONTACT YOU TO SCHEDULE THIS   The test will take approximately 3 to  4 hours to complete; you may bring reading material.  If someone comes with you to your appointment, they will need to remain in the main lobby due to limited space in the testing area.    How to prepare for your Myocardial Perfusion Test:  Do not eat or drink 3 hours prior to your test, except you may have water.  Do not consume products containing caffeine (regular or decaffeinated) 12 hours prior to your test. (ex: coffee, chocolate, sodas, tea).  Do wear comfortable clothes (no dresses or overalls) and walking shoes, tennis shoes preferred (No heels or open toe shoes are allowed).  Do NOT wear cologne, perfume, aftershave, or lotions (deodorant is allowed).  If you use an inhaler, use it the AM of your test and bring it with you.   If you use a nebulizer, use it the AM of your test.   If these instructions are not followed, your test will have to be rescheduled.  Follow-Up: At [facility], you and your  health needs are our priority.  As part of our continuing mission to provide you with exceptional heart care, we have created designated Provider Care Teams.  These Care Teams include your primary Cardiologist (physician) and Advanced Practice Providers (APPs -  Physician Assistants and Nurse Practitioners) who all work together to provide you with the care you need, when you need it.  Your next appointment:   6 month(s)  The format for your next appointment:   In Person  Provider:   Glenetta Hew, MD   Shared Decision Making/Informed Consent The risks [chest pain, shortness of breath, cardiac arrhythmias, dizziness, blood pressure fluctuations, myocardial infarction, stroke/transient ischemic attack, nausea, vomiting, allergic reaction, radiation exposure, metallic taste sensation and life-threatening complications (estimated to be 1 in 10,000)], benefits (risk stratification, diagnosing coronary artery disease, treatment guidance) and alternatives of a nuclear stress test were discussed in detail with Mr. Mizrahi and he agrees to proceed.   Studies Ordered:   Orders Placed This Encounter  Procedures  . MYOCARDIAL PERFUSION IMAGING  . EKG 12-Lead     Glenetta Hew, M.D., M.S. Interventional Cardiologist   Pager # [phone] Phone # [phone] [address] [place], [place] [address]   Thank you for choosing Heartcare at [facility]!!

## 2020-06-25 ENCOUNTER — Telehealth: Payer: Self-pay | Admitting: Cardiology

## 2020-06-25 ENCOUNTER — Encounter: Payer: Self-pay | Admitting: Cardiology

## 2020-06-25 DIAGNOSIS — F325 Major depressive disorder, single episode, in full remission: Secondary | ICD-10-CM | POA: Diagnosis not present

## 2020-06-25 DIAGNOSIS — I10 Essential (primary) hypertension: Secondary | ICD-10-CM | POA: Diagnosis not present

## 2020-06-25 DIAGNOSIS — N1831 Chronic kidney disease, stage 3a: Secondary | ICD-10-CM | POA: Diagnosis not present

## 2020-06-25 NOTE — Telephone Encounter (Signed)
Spoke with to discuss scheduling the [facility] and follow up appointment ordered by Dr. Ellyn Hack.  Myoview scheduled Thursday 07/09/20 at 10:45 am--[address]---6 month follow up scheduled Tuesday 09/28/20 at 10:40 am.  Will mail information to patient and he voiced his understanding.

## 2020-06-25 NOTE — Progress Notes (Incomplete)
Primary Care Provider: Lavone Orn, MD Cardiologist: Glenetta Hew, MD Electrophysiologist: None  Clinic Note: No chief complaint on file.   ===================================  ASSESSMENT/PLAN   Problem List Items Addressed This Visit    CAD S/P percutaneous coronary angioplasty: Cypher DES x2 to circumflex - Primary (Chronic)   Relevant Medications   diltiazem (CARDIZEM CD) 240 MG 24 hr capsule   rosuvastatin (CRESTOR) 20 MG tablet   Other Relevant Orders   EKG 12-Lead (Completed)   PAF (paroxysmal atrial fibrillation) ; CHA2DS2-VASc Score =5 (Eliquis) (Chronic)   Relevant Medications   diltiazem (CARDIZEM CD) 240 MG 24 hr capsule   rosuvastatin (CRESTOR) 20 MG tablet   Other Relevant Orders   EKG 12-Lead (Completed)   Essential hypertension (Chronic)   Relevant Medications   diltiazem (CARDIZEM CD) 240 MG 24 hr capsule   rosuvastatin (CRESTOR) 20 MG tablet   Other Relevant Orders   EKG 12-Lead (Completed)   Hyperlipidemia LDL goal <70 (Chronic)   Relevant Medications   diltiazem (CARDIZEM CD) 240 MG 24 hr capsule   rosuvastatin (CRESTOR) 20 MG tablet   Other Relevant Orders   EKG 12-Lead (Completed)   Diastolic dysfunction without heart failure (Chronic)   Relevant Orders   EKG 12-Lead (Completed)    Other Visit Diagnoses    Precordial pain       Relevant Orders   MYOCARDIAL PERFUSION IMAGING      ===================================  HPI:    [name] is a 85 y.o. male with a PMH notable for distant history of CAD-PCI as well as (recent diagnosis) A. Fib, along with OSA on CPAP, as well as significant disability from spinal cord tumor (post resection) below who presents today for 4 month f/u.   01/17/2020: -> He has restarted his statin.;  BP meds changed from diltiazem to amlodipine; and ACE inhibitor converted to Micardis. ;  Plan was to continue without rate control for A. fib, hoping to improve energy level  [name] was last seen  on March 02, 2020: He is doing quite well.  Still very depressed about his wife health condition.  He was feeling very unhelpful as far as caring for her, and was not good at doing her part of the chores along with caring for her.  He is concerned because she is so weak & has been making poor decisions - poor memory etc. --> He did note poor energy level & still has pretty significant gait instability which limits his activity level - my be construed as DOE.  Recent Hospitalizations: n/a  Reviewed  CV studies:    The following studies were reviewed today: (if available, images/films reviewed: From Epic Chart or Care Everywhere) . none:  Interval History:   [name] presents today overall doing fairly well, but does note having more frequent L side CP symptoms -- described as sharp pin-prick like symptoms that can happen with or without exertion -but do seem to get worse with exertion.  I has him concerned enough that he has actually taken NTG to see if it will help.  Not necessarily associated with dyspnea or other symptoms.  Energy level did improve some extent after stopping beta-blocker and then even more so after finally stopping rate control with diltiazem as well.  He is no longer on rate control.  Rates have been stable.  Otherwise, he seems to be relatively stable.  No sensation of A. fib  CV Review of Symptoms (Summary): positive for -  chest pain, dyspnea on exertion and Exertional dyspnea because of deconditioning poor balance. negative for - edema, irregular heartbeat, orthopnea, palpitations, paroxysmal nocturnal dyspnea, rapid heart rate, shortness of breath or Medicine dizziness with vertigo and poor balance, he has no lightheadedness or near syncope/syncope, TIA/amaurosis fugax, claudication  The patient does not have symptoms concerning for COVID-19 infection (fever, chills, cough, or new shortness of breath).   REVIEWED OF SYSTEMS   Review of Systems   Constitutional: Positive for malaise/fatigue (Overall energy is better, but still tired because he is not sleeping well.). Negative for diaphoresis.  HENT: Negative for congestion.   Respiratory: Negative for cough and shortness of breath.   Cardiovascular: Positive for leg swelling (Mild). Negative for palpitations.  Gastrointestinal: Negative for blood in stool and melena.  Genitourinary: Negative for hematuria.  Musculoskeletal: Positive for back pain. Negative for falls and joint pain.  Neurological: Positive for weakness (Bilateral legs). Negative for dizziness (Just bad balance).  Psychiatric/Behavioral: Positive for depression (He feels helpless when he comes his wife.  This has been very upsetting to him.). The patient has insomnia (Not sleeping because he is stressed about caring for his wife.).    I have reviewed and (if needed) personally updated the patient's problem list, medications, allergies, past medical and surgical history, social and family history.   PAST MEDICAL HISTORY   Past Medical History:  Diagnosis Date  . Arthritis   . CAD S/P percutaneous coronary angioplasty 08/2004   CARDIOLOGIST-  DR DAVID HARDING; DES to LCx. X2  - Cypher 2.5 mm postdilated to 2.75 mm (20 mm and 13 mm stents);; Myoview 09/2014: LOW RISK. No Infarct or Ischemia.  . Colon polyps 05/10/2005   Hyperplastic polyps  . Complication of anesthesia    "takes long time to wake up"  . Depression   . Diabetes mellitus type 2 with complications (HCC)    neuropathy; CAD borderline- no med  . Diastolic dysfunction without heart failure    Moderate LVH, Gr 1 DD on Echo 2011; no CHF admissions, no tt on diuretisc  . Essential hypertension   . H/O: gout STABLE  . History of melanoma excision 2010   FOREHEAD  . History of prostate cancer 2004--  S/P SEED IMPLANTS    NO RECURRENCE  . Hydrocele, left   . Hyperlipidemia LDL goal <70   . Impaired hearing RIGHT HEARING AID  . OSA on CPAP    bipap  .  PAF (paroxysmal atrial fibrillation) ([place]) 12/11/2017   Admitted with Afib RVR - converted on IV Diltiazem. d/c on Eliquis; CHA2DS2Vasc = 5.  . Prostate cancer ([place])    prostate , melanoma head  . Shingles 07  . Syncope and collapse March 2017   Likely related to post micturition, Neurontin and dehydration with orthostatic hypotension  . Urgency of urination     PAST SURGICAL HISTORY   Past Surgical History:  Procedure Laterality Date  . Cardiac Event Monitor  3-07/2015   Mostly SR - 58-132 bpm. Rare PVCs (occ bigeminy), Frequent PACs - singlets, couplets - short runs of PAT.  . circumsision    . CORONARY ANGIOPLASTY WITH STENT PLACEMENT  09-01-2004     Cypher DES x 2 d-m LCx 2.5 mm x 20 mm & 2.5 mm x 13 mm (~2.75 mm)  . EXCISION MELANOMA FROM FOREHEAD  2010  . EYE SURGERY Bilateral    cataracts  . HYDROCELE EXCISION  10/14/2011   Procedure: HYDROCELECTOMY ADULT;  Surgeon: Ailene Rud, MD;  Location: [facility];  Service: Urology;  Laterality: Left;  . LAMINECTOMY N/A 07/13/2012   Procedure: THORACIC LAMINECTOMY FOR RESECTION OF INTRAMEDULLARY SPINAL CHORD TUMOR WITH SPINAL CHORD MONITORING;  Surgeon: Erline Levine, MD;  Location: [facility];  Service: Neurosurgery;  Laterality: N/A;  Thoracic laminectomy for resection of intramedullary spinal cord tumor with spinal cord monitering and Dr. Christella Noa to assist  . LAMINECTOMY N/A 12/09/2014   Procedure: Redo Thoracic laminectomy with intramedullary tumar resection/USN/[place]/Subarachnoid shunt/spinal cord monitoring/Dr. Kathyrn Sheriff to assist;  Surgeon: Erline Levine, MD;  Location: [facility];  Service: Neurosurgery;  Laterality: N/A;  Redo Thoracic laminectomy with intramedullary tumar resection/USN/[place]/Subarachnoid shunt/spinal cord monitoring/Dr. Kathyrn Sheriff to assist  . NM MYOVIEW LTD  3/212014; June 2016   Both Lexiscan: a. LOW RISK, mild inferior bowel artifact; No ischemia or Infarction; b. Low risk, normal study. No  infarction or ischemia. No artifact.  Marland Kitchen PROSTATE PALLADIUM GOLD SEED IMPLANTS (118)  11-15-2002   PROSTATE CANCER  . SKIN BIOPSY    . TONSILLECTOMY    . TRANSTHORACIC ECHOCARDIOGRAM  12/2017   (A. fib): Moderate concentric LVH.  EF 55 to 60%.  No wall motion normality.  Mild to moderately dilated left atrium.    Immunization History  Administered Date(s) Administered  . Influenza,inj,Quad PF,6+ Mos 12/10/2014  . PFIZER(Purple Top)SARS-COV-2 Vaccination 04/17/2019, 05/08/2019  . Pneumococcal Polysaccharide-23 07/14/2012    MEDICATIONS/ALLERGIES   Current Meds  Medication Sig  . allopurinol (ZYLOPRIM) 300 MG tablet Take 1 tablet (300 mg total) by mouth daily.  Marland Kitchen amLODipine (NORVASC) 5 MG tablet Take 1 tablet (5 mg total) by mouth daily.  Marland Kitchen apixaban (ELIQUIS) 5 MG TABS tablet Take 1 tablet (5 mg total) by mouth 2 (two) times daily.  Marland Kitchen diltiazem (CARDIZEM CD) 240 MG 24 hr capsule TAKE 1 CAPSULE BY MOUTH EVERY DAY FOR 90 DAYS  . escitalopram (LEXAPRO) 10 MG tablet Take 1 tablet by mouth daily.  . metFORMIN (GLUCOPHAGE) 500 MG tablet Take 1 tablet (500 mg total) by mouth 2 (two) times daily with a meal.  . ONETOUCH ULTRA test strip USE TO TEST BLOOD SUGAR ONCE OR TWICE A DAY (E11.22)  . oxybutynin (DITROPAN XL) 15 MG 24 hr tablet Take 1 tablet (15 mg total) by mouth at bedtime.  . rosuvastatin (CRESTOR) 20 MG tablet Take 20 mg by mouth at bedtime.  . tamsulosin (FLOMAX) 0.4 MG CAPS capsule Take 1 capsule (0.4 mg total) by mouth at bedtime.  Marland Kitchen telmisartan (MICARDIS) 40 MG tablet Take 1 tablet (40 mg total) by mouth daily.  Marland Kitchen VITAMIN D, CHOLECALCIFEROL, PO Take 1 tablet by mouth daily.   . [DISCONTINUED] chlorthalidone (HYGROTON) 25 MG tablet Take 0.5 tablets (12.5 mg total) by mouth daily.    Allergies  Allergen Reactions  . Betadine [Povidone Iodine] Anaphylaxis  . Contrast Media [Iodinated Diagnostic Agents] Anaphylaxis and Rash    Other Reaction: Other reaction  . Iodine  Anaphylaxis    Shellfish, IVP dye. Swelling in throat and foaming at mouth.  . Shellfish Allergy Anaphylaxis    SOCIAL HISTORY/FAMILY HISTORY   Reviewed in Epic:  Pertinent findings:  Social History   Tobacco Use  . Smoking status: Never Smoker  . Smokeless tobacco: Never Used  . Tobacco comment: occ alcohol  Vaping Use  . Vaping Use: Never used  Substance Use Topics  . Alcohol use: Yes    Comment: OCCASIONAL  . Drug use: No   Social History   Social History Narrative  He is married, father of 2, grandfather of 71. Does not really get exercise now because   of his back issues. Does not smoke and drinks social alcohol. Works in [employer].    OBJCTIVE -PE, EKG, labs   Wt Readings from Last 3 Encounters:  06/24/20 205 lb (93 kg)  03/02/20 204 lb 12.8 oz (92.9 kg)  01/17/20 195 lb (88.5 kg)    Physical Exam: BP (!) 168/86 (BP Location: Left Arm, Patient Position: Sitting, Cuff Size: Normal)   Pulse (!) 55   Ht 5\' 9"  (1.753 m)   Wt 205 lb (93 kg)   SpO2 97%   BMI 30.27 kg/m  Physical Exam Constitutional:      General: He is not in acute distress.    Appearance: Normal appearance. He is obese. He is not ill-appearing or toxic-appearing.  HENT:     Head: Normocephalic and atraumatic.  Cardiovascular:     Rate and Rhythm: Regular rhythm. Bradycardia present.     Pulses: Normal pulses.     Heart sounds: Normal heart sounds. No friction rub. No gallop.   Pulmonary:     Effort: Pulmonary effort is normal. No respiratory distress.     Breath sounds: Normal breath sounds.  Chest:     Chest wall: No tenderness.  Musculoskeletal:        General: Swelling (Trivial BLE) present. Normal range of motion.     Cervical back: Normal range of motion and neck supple.  Skin:    General: Skin is warm and dry.  Neurological:     General: No focal deficit present.     Mental Status: He is alert and oriented to person, place, and time.     Coordination: Coordination  abnormal (Legs are somewhat jittery and wobbly with walking.).     Gait: Gait abnormal (Very unsteady wobbly gait, walks with a cane.  Wide-based).  Psychiatric:        Behavior: Behavior normal.        Thought Content: Thought content normal.        Judgment: Judgment normal.     Comments: His mood actually seems to be better today.  He says that he is just depressed, but he seems to be in better spirits today than before.      Adult ECG Report  Rate: 55 ;  Rhythm: sinus bradycardia and 1  AVB, left axis deviation, nonspecific ST and T wave changes.;   Narrative Interpretation: Stable  Recent Labs:    05/07/2020: TC 135, TG 148, HDL 44, LDL 45.  A1c 6.2, glucose 97.  BUN 38/creatinine 2.02 ==================================================  COVID-19 Education: The signs and symptoms of COVID-19 were discussed with the patient and how to seek care for testing (follow up with PCP or arrange E-visit).   The importance of social distancing and COVID-19 vaccination was discussed today. The patient is practicing social distancing & Masking.   I spent a total of 30 minutes with the patient spent in direct patient consultation.  Additional time spent with chart review  / charting (studies, outside notes, etc): 16 min Total Time: 46 min   Current medicines are reviewed at length with the patient today.  (+/- concerns) N/A  This visit occurred during the SARS-CoV-2 public health emergency.  Safety protocols were in place, including screening questions prior to the visit, additional usage of staff PPE, and extensive cleaning of exam room while observing appropriate contact time as indicated for disinfecting solutions.  Notice: This dictation  was prepared with Dragon dictation along with smaller phrase technology. Any transcriptional errors that result from this process are unintentional and may not be corrected upon review.  Patient Instructions / Medication Changes & Studies & Tests Ordered    Patient Instructions  Medication Instructions:  No Changes In Medications at this time.  *If you need a refill on your cardiac medications before your next appointment, please call your pharmacy*  Testing/Procedures: Your physician has requested that you have a lexiscan myoview. For further information please visit [email or web address]. Please follow instruction sheet, as given.This will take place at [address]  SOMEONE WILL CONTACT YOU TO SCHEDULE THIS   The test will take approximately 3 to 4 hours to complete; you may bring reading material.  If someone comes with you to your appointment, they will need to remain in the main lobby due to limited space in the testing area.    How to prepare for your Myocardial Perfusion Test:  Do not eat or drink 3 hours prior to your test, except you may have water.  Do not consume products containing caffeine (regular or decaffeinated) 12 hours prior to your test. (ex: coffee, chocolate, sodas, tea).  Do wear comfortable clothes (no dresses or overalls) and walking shoes, tennis shoes preferred (No heels or open toe shoes are allowed).  Do NOT wear cologne, perfume, aftershave, or lotions (deodorant is allowed).  If you use an inhaler, use it the AM of your test and bring it with you.   If you use a nebulizer, use it the AM of your test.   If these instructions are not followed, your test will have to be rescheduled.  Follow-Up: At [facility], you and your health needs are our priority.  As part of our continuing mission to provide you with exceptional heart care, we have created designated Provider Care Teams.  These Care Teams include your primary Cardiologist (physician) and Advanced Practice Providers (APPs -  Physician Assistants and Nurse Practitioners) who all work together to provide you with the care you need, when you need it.  Your next appointment:   6 month(s)  The format for your next appointment:   In  Person  Provider:   Glenetta Hew, MD   Shared Decision Making/Informed Consent The risks [chest pain, shortness of breath, cardiac arrhythmias, dizziness, blood pressure fluctuations, myocardial infarction, stroke/transient ischemic attack, nausea, vomiting, allergic reaction, radiation exposure, metallic taste sensation and life-threatening complications (estimated to be 1 in 10,000)], benefits (risk stratification, diagnosing coronary artery disease, treatment guidance) and alternatives of a nuclear stress test were discussed in detail with Mr. Mccahill and he agrees to proceed.   Studies Ordered:   Orders Placed This Encounter  Procedures  . MYOCARDIAL PERFUSION IMAGING  . EKG 12-Lead     Glenetta Hew, M.D., M.S. Interventional Cardiologist   Pager # [phone] Phone # [phone] [address] [place], [place] [address]   Thank you for choosing Heartcare at [facility]!!

## 2020-06-26 ENCOUNTER — Encounter: Payer: Self-pay | Admitting: Cardiology

## 2020-06-26 NOTE — Assessment & Plan Note (Signed)
Unfortunately, he is not really active.  Not sure if he be active walking as enough to potentially cause exertional symptoms, but he is also now noting some resting symptoms.  For his peace of mind with amount of stress that he is under, and the fact that he has not had a check in 6 years, we can then recheck a Lexiscan Myoview.  See consent documentation below.

## 2020-06-26 NOTE — Assessment & Plan Note (Addendum)
Significant elevated chads vas score.  Maintaining on Eliquis. Okay to hold Eliquis 24 to 72 hours preop for surgery procedures:    Skin, percutaneous procedures or minor GI/Or dental procedures can hold 48 hours,  Spinal or neurosurgery procedures hold 72 hours.

## 2020-06-26 NOTE — Assessment & Plan Note (Signed)
Distant history of Cypher DES x2 to the LCx.  Has been doing very well since 2006.  His most recent Myoview was in June 2016.  Relatively normal.  He is concerned I think with amount of stress and anxiety is gone his life, we need to exclude ischemic CAD.  He has not complained of symptoms like this in a while.  Plan: Continue on amlodipine for antianginal blood pressure effect.  He is on rosuvastatin along with ARB.Marland Kitchen  Not currently on antiplatelet agent because of Eliquis.

## 2020-06-26 NOTE — Assessment & Plan Note (Signed)
He is a relatively euvolemic.  Not on diuretic.  He has mild swelling that goes down when he puts his feet up.  For now we will hold off on diuretic

## 2020-06-26 NOTE — Assessment & Plan Note (Signed)
His blood pressure is high again today.  I am asked that he can monitor his pressures at home.  My inclination will be to probably increase either the ARB or amlodipine depending on results of his stress test.  He had previously on chlorthalidone and has been stopped.  His ARB dose was also reduced back in December.  Seem to be yoyoing with his blood pressures.

## 2020-06-26 NOTE — Assessment & Plan Note (Signed)
Unfortunate he is relatively asymptomatic when he is or is not in A. fib.  As such, we have not been on tell how much he does go into A. fib.  He is on Eliquis for anticoagulation, but with resting bradycardia, does not require either not hydrating calcium channel blocker or beta-blocker for rate control.  Continue Eliquis -he denies any sort of bleeding issues.

## 2020-06-29 ENCOUNTER — Other Ambulatory Visit: Payer: Self-pay | Admitting: Internal Medicine

## 2020-06-29 DIAGNOSIS — N1832 Chronic kidney disease, stage 3b: Secondary | ICD-10-CM

## 2020-07-02 ENCOUNTER — Other Ambulatory Visit: Payer: Self-pay | Admitting: *Deleted

## 2020-07-02 DIAGNOSIS — I2510 Atherosclerotic heart disease of native coronary artery without angina pectoris: Secondary | ICD-10-CM

## 2020-07-02 DIAGNOSIS — R079 Chest pain, unspecified: Secondary | ICD-10-CM

## 2020-07-02 DIAGNOSIS — Z9861 Coronary angioplasty status: Secondary | ICD-10-CM

## 2020-07-02 NOTE — Progress Notes (Signed)
Please sign  Attestation for [employer]

## 2020-07-05 DIAGNOSIS — Z23 Encounter for immunization: Secondary | ICD-10-CM | POA: Diagnosis not present

## 2020-07-06 ENCOUNTER — Telehealth: Payer: Self-pay | Admitting: Pulmonary Disease

## 2020-07-06 ENCOUNTER — Other Ambulatory Visit: Payer: Self-pay

## 2020-07-06 ENCOUNTER — Ambulatory Visit (INDEPENDENT_AMBULATORY_CARE_PROVIDER_SITE_OTHER): Payer: Medicare Other | Admitting: Pulmonary Disease

## 2020-07-06 ENCOUNTER — Encounter: Payer: Self-pay | Admitting: Pulmonary Disease

## 2020-07-06 VITALS — BP 142/66 | HR 73 | Temp 97.9°F | Ht 69.0 in | Wt 206.0 lb

## 2020-07-06 DIAGNOSIS — I2510 Atherosclerotic heart disease of native coronary artery without angina pectoris: Secondary | ICD-10-CM | POA: Diagnosis not present

## 2020-07-06 DIAGNOSIS — E782 Mixed hyperlipidemia: Secondary | ICD-10-CM | POA: Diagnosis not present

## 2020-07-06 DIAGNOSIS — Z9861 Coronary angioplasty status: Secondary | ICD-10-CM | POA: Diagnosis not present

## 2020-07-06 DIAGNOSIS — N1831 Chronic kidney disease, stage 3a: Secondary | ICD-10-CM | POA: Diagnosis not present

## 2020-07-06 DIAGNOSIS — N1832 Chronic kidney disease, stage 3b: Secondary | ICD-10-CM | POA: Diagnosis not present

## 2020-07-06 DIAGNOSIS — F324 Major depressive disorder, single episode, in partial remission: Secondary | ICD-10-CM | POA: Diagnosis not present

## 2020-07-06 DIAGNOSIS — Z8546 Personal history of malignant neoplasm of prostate: Secondary | ICD-10-CM | POA: Diagnosis not present

## 2020-07-06 DIAGNOSIS — I10 Essential (primary) hypertension: Secondary | ICD-10-CM | POA: Diagnosis not present

## 2020-07-06 DIAGNOSIS — I480 Paroxysmal atrial fibrillation: Secondary | ICD-10-CM | POA: Diagnosis not present

## 2020-07-06 DIAGNOSIS — G4733 Obstructive sleep apnea (adult) (pediatric): Secondary | ICD-10-CM | POA: Diagnosis not present

## 2020-07-06 DIAGNOSIS — I701 Atherosclerosis of renal artery: Secondary | ICD-10-CM | POA: Diagnosis not present

## 2020-07-06 DIAGNOSIS — E1122 Type 2 diabetes mellitus with diabetic chronic kidney disease: Secondary | ICD-10-CM | POA: Diagnosis not present

## 2020-07-06 NOTE — Telephone Encounter (Signed)
Called and spoke with Patient. Patient stated Dr. Halford Chessman asked for DME company name at his [place] today. DME is [employer]. Updated [facility] note with DME info. Called [employer] to get Dr. Halford Chessman a download, spoke with Roselyn Reef.  Roselyn Reef stated the pap person would call office back. Roselyn Reef stated the person I needed to speak with was on another call, but would call back.  Will leave in triage for call back.

## 2020-07-06 NOTE — Progress Notes (Signed)
[facility]  Chief Complaint  Patient presents with  . sleep consult    Changing providers , mask my not fit, already on CPAP    Constitutional:  BP (!) 142/66 (BP Location: Right Arm, Cuff Size: Normal)   Pulse 73   Temp 97.9 F (36.6 C) (Temporal)   Ht 5\' 9"  (1.753 m)   Wt 206 lb (93.4 kg)   SpO2 98%   BMI 30.42 kg/m   Past Medical History:  CAD, OA, Colon polyps, Depression, DM type 2, HTN, Gout, Melanoma on forehead, Prostate cancer, HLD, PAF, Shingles  Past Surgical History:  He  has a past surgical history that includes [place] (118) (11-15-2002); EXCISION MELANOMA FROM FOREHEAD (2010); Coronary angioplasty with stent (09-01-2004  ); transthoracic echocardiogram (12/2017); Hydrocele surgery (10/14/2011); circumsision; Laminectomy (N/A, 07/13/2012); NM MYOVIEW LTD (3/212014; June 2016); Eye surgery (Bilateral); Tonsillectomy; Laminectomy (N/A, 12/09/2014); Skin biopsy; and Cardiac Event Monitor (3-07/2015).  Brief Summary:  [name] is a 85 y.o. male with obstructive sleep apnea.      Subjective:   He is here with his wife.  He is followed by Dr. Ellyn Hack for CAD.  There was concern about his sleep apnea. He had sleep study in October 2020.  This showed severe sleep apnea.  He then had titration study in December 2020.  He wasn't adequately controlled with CPAP.  He was changed to Bipap, and started on Bipap 20/16 through [facility].  He feels the pressure is too high.  He is having trouble with mask leak and getting puffiness under his eyes.    Prior to this he was seen by Dr. Asencion Partridge Dohmeier with [facility] neurology and had sleep study in 2007.  His AHI at the time was 25.  He was started on VPAP.  He goes to sleep at 12 am.  He falls asleep quickly.  He wakes up 2 times to use the bathroom.  He gets out of bed at 10 am.  He feels okay in the morning.  He denies morning headache.  He does not  use anything to help him fall sleep or stay awake.  He denies sleep walking, sleep talking, bruxism, or nightmares.  There is no history of restless legs.  He denies sleep hallucinations, sleep paralysis, or cataplexy.  The Epworth score is 16 out of 24.    Physical Exam:   Appearance - well kempt   ENMT - no sinus tenderness, no oral exudate, no LAN, Mallampati 3 airway, no stridor  Respiratory - equal breath sounds bilaterally, no wheezing or rales  CV - s1s2 regular rate and rhythm, no murmurs  Ext - no clubbing, no edema  Skin - no rashes  Psych - normal mood and affect   Sleep Tests:   PSG 01/19/19 >> AHI 41.1, SpO2 low 83%  Bipap titration 03/08/19 >> Bipap 20/16 cm H2O   Cardiac Tests:   Echo 12/12/17 >> EF 55 to 60%, mod LVH  Social History:  He  reports that he has never smoked. He has never used smokeless tobacco. He reports current alcohol use. He reports that he does not use drugs.  Family History:  His family history includes Hypertension in his brother, father, and mother; Prostate cancer in his brother and father; Stroke in his mother.     Assessment/Plan:   Obstructive sleep apnea. - reviewed his sleep study - discussed how sleep apnea can impact his health -  treatment options discussed  - he uses [facility] for his DME - he has been using Bipap 20/16 cm H2O - will get copy of his Bipap download and then determine if he needs decrease in pressure setting - if he is still having issues, then he will need mask refitting  CAD, PAF, Chronic diastolic CHF. - followed by Dr. Glenetta Hew with [facility]  Time Spent Involved in Patient Care on Day of Examination:  32 minutes  Follow up:  Patient Instructions  Will get a copy of your Bipap report from [facility] and call with results  Follow up in 2 months   Medication List:   Allergies as of 07/06/2020      Reactions   Betadine [povidone Iodine] Anaphylaxis   Contrast Media  [iodinated Diagnostic Agents] Anaphylaxis, Rash   Other Reaction: Other reaction   Iodine Anaphylaxis   Shellfish, IVP dye. Swelling in throat and foaming at mouth.   Shellfish Allergy Anaphylaxis      Medication List       Accurate as of July 06, 2020  3:59 PM. If you have any questions, ask your nurse or doctor.        STOP taking these medications   amLODipine 5 MG tablet Commonly known as: NORVASC Stopped by: Chesley Mires, MD   diltiazem 240 MG 24 hr capsule Commonly known as: CARDIZEM CD Stopped by: Chesley Mires, MD     TAKE these medications   allopurinol 300 MG tablet Commonly known as: ZYLOPRIM Take 1 tablet (300 mg total) by mouth daily.   apixaban 5 MG Tabs tablet Commonly known as: ELIQUIS Take 1 tablet (5 mg total) by mouth 2 (two) times daily.   carvedilol 6.25 MG tablet Commonly known as: COREG 1 tablet with food   escitalopram 10 MG tablet Commonly known as: LEXAPRO Take 1 tablet by mouth daily.   metFORMIN 500 MG tablet Commonly known as: GLUCOPHAGE Take 1 tablet (500 mg total) by mouth 2 (two) times daily with a meal.   OneTouch Ultra test strip Generic drug: glucose blood USE TO TEST BLOOD SUGAR ONCE OR TWICE A DAY (E11.22)   oxybutynin 15 MG 24 hr tablet Commonly known as: DITROPAN XL Take 1 tablet (15 mg total) by mouth at bedtime.   rosuvastatin 20 MG tablet Commonly known as: CRESTOR Take 20 mg by mouth at bedtime.   tamsulosin 0.4 MG Caps capsule Commonly known as: FLOMAX Take 1 capsule (0.4 mg total) by mouth at bedtime.   telmisartan 40 MG tablet Commonly known as: MICARDIS Take 1 tablet (40 mg total) by mouth daily.   VITAMIN D (CHOLECALCIFEROL) PO Take 1 tablet by mouth daily.       Signature:  Chesley Mires, MD [place] Pager - [phone] 07/06/2020, 3:59 PM

## 2020-07-06 NOTE — Patient Instructions (Signed)
Will get a copy of your Bipap report from [place] and call with results  Follow up in 2 months

## 2020-07-07 ENCOUNTER — Encounter (HOSPITAL_COMMUNITY): Payer: Medicare Other

## 2020-07-07 ENCOUNTER — Telehealth (HOSPITAL_COMMUNITY): Payer: Self-pay

## 2020-07-07 NOTE — Telephone Encounter (Signed)
Detailed instructions left on the patient's answering machine. Asked to call back with any questions. S.Nihal Doan EMTP 

## 2020-07-09 ENCOUNTER — Other Ambulatory Visit: Payer: Self-pay

## 2020-07-09 ENCOUNTER — Ambulatory Visit (HOSPITAL_COMMUNITY): Payer: Medicare Other | Attending: Cardiology

## 2020-07-09 DIAGNOSIS — R072 Precordial pain: Secondary | ICD-10-CM | POA: Diagnosis not present

## 2020-07-09 HISTORY — PX: NM MYOVIEW LTD: HXRAD82

## 2020-07-09 LAB — MYOCARDIAL PERFUSION IMAGING
LV dias vol: 110 mL (ref 62–150)
LV sys vol: 53 mL
Peak HR: 70 {beats}/min
Rest HR: 53 {beats}/min
SDS: 2
SRS: 0
SSS: 2
TID: 1.02

## 2020-07-09 MED ORDER — TECHNETIUM TC 99M TETROFOSMIN IV KIT
10.1000 | PACK | Freq: Once | INTRAVENOUS | Status: AC | PRN
  Administered 2020-07-09: 10.1 via INTRAVENOUS
  Filled 2020-07-09: qty 11

## 2020-07-09 MED ORDER — REGADENOSON 0.4 MG/5ML IV SOLN
0.4000 mg | Freq: Once | INTRAVENOUS | Status: AC
Start: 2020-07-09 — End: 2020-07-09
  Administered 2020-07-09: 0.4 mg via INTRAVENOUS

## 2020-07-09 MED ORDER — TECHNETIUM TC 99M TETROFOSMIN IV KIT
29.5000 | PACK | Freq: Once | INTRAVENOUS | Status: AC | PRN
  Administered 2020-07-09: 29.5 via INTRAVENOUS
  Filled 2020-07-09: qty 30

## 2020-07-09 NOTE — Telephone Encounter (Signed)
ATC [facility] x2. The line would stay quiet for several seconds then disconnect. The line did not ring.   Will forward to [place] to attempt to call for DL.

## 2020-07-13 ENCOUNTER — Ambulatory Visit
Admission: RE | Admit: 2020-07-13 | Discharge: 2020-07-13 | Disposition: A | Discharge status: Home / Self Care | Payer: Medicare Other | Source: Ambulatory Visit | Attending: Internal Medicine | Admitting: Internal Medicine

## 2020-07-13 DIAGNOSIS — N189 Chronic kidney disease, unspecified: Secondary | ICD-10-CM | POA: Diagnosis not present

## 2020-07-13 DIAGNOSIS — I10 Essential (primary) hypertension: Secondary | ICD-10-CM | POA: Diagnosis not present

## 2020-07-13 DIAGNOSIS — N281 Cyst of kidney, acquired: Secondary | ICD-10-CM | POA: Diagnosis not present

## 2020-07-13 DIAGNOSIS — N1832 Chronic kidney disease, stage 3b: Secondary | ICD-10-CM

## 2020-07-14 NOTE — Telephone Encounter (Signed)
Called [facility] and left message on voicemail to please return phone call and/or add [facility] to airview to get Download on patient. Will attempt to call back again tomorrow if I do not hear back.

## 2020-07-15 NOTE — Telephone Encounter (Signed)
Download printed from [facility] and will place in Dr Juanetta Gosling look at folder at the [place] office.

## 2020-07-22 ENCOUNTER — Telehealth: Payer: Self-pay | Admitting: Pulmonary Disease

## 2020-07-22 DIAGNOSIS — G4733 Obstructive sleep apnea (adult) (pediatric): Secondary | ICD-10-CM

## 2020-07-22 NOTE — Telephone Encounter (Signed)
Auto Bipap 06/15/20 to 07/14/20 >> used on 29 of 30 nights with average 6 hrs 21 min.  Average AHI 10.5 with median Bipap 20/16 and 95 th percentile Bipap 21/17 cm H2O.  Significant air leak.  Please let him know auto Bipap download reviewed and will decreased pressure settings.  I have sent order to his DME to change his auto Bipap to maximum IPAP 22 cm H2O, minimum EPAP 12 cm H2O, pressure support 4 cm H2O.  He should contact the office if he has trouble adjusting to new pressure settings.

## 2020-07-22 NOTE — Telephone Encounter (Signed)
Called and went over bipap download results and recommendations per Dr Halford Chessman with patient. All questions answered and patient expressed full understanding of recommendations and changes in bipap settings per Dr Halford Chessman. Patient aware to contact office if he has trouble adjusting to new pressure settings per Dr Halford Chessman. Nothing further needed at this time.

## 2020-07-30 ENCOUNTER — Encounter: Payer: Self-pay | Admitting: Cardiology

## 2020-07-30 ENCOUNTER — Ambulatory Visit (INDEPENDENT_AMBULATORY_CARE_PROVIDER_SITE_OTHER): Payer: Medicare Other | Admitting: Cardiology

## 2020-07-30 ENCOUNTER — Other Ambulatory Visit: Payer: Self-pay

## 2020-07-30 VITALS — BP 158/72 | HR 59 | Ht 69.0 in | Wt 201.2 lb

## 2020-07-30 DIAGNOSIS — R079 Chest pain, unspecified: Secondary | ICD-10-CM | POA: Diagnosis not present

## 2020-07-30 DIAGNOSIS — Z7901 Long term (current) use of anticoagulants: Secondary | ICD-10-CM | POA: Diagnosis not present

## 2020-07-30 DIAGNOSIS — I10 Essential (primary) hypertension: Secondary | ICD-10-CM | POA: Diagnosis not present

## 2020-07-30 DIAGNOSIS — I2510 Atherosclerotic heart disease of native coronary artery without angina pectoris: Secondary | ICD-10-CM | POA: Diagnosis not present

## 2020-07-30 DIAGNOSIS — I480 Paroxysmal atrial fibrillation: Secondary | ICD-10-CM | POA: Diagnosis not present

## 2020-07-30 DIAGNOSIS — Z9861 Coronary angioplasty status: Secondary | ICD-10-CM

## 2020-07-30 DIAGNOSIS — R9439 Abnormal result of other cardiovascular function study: Secondary | ICD-10-CM | POA: Diagnosis not present

## 2020-07-30 DIAGNOSIS — E785 Hyperlipidemia, unspecified: Secondary | ICD-10-CM

## 2020-07-30 MED ORDER — PREDNISONE 50 MG PO TABS: ORAL_TABLET | ORAL | 0 refills | Status: DC

## 2020-07-30 NOTE — H&P (View-Only) (Signed)
Primary Care Provider: Lavone Orn, MD Cardiologist: Glenetta Hew, MD Electrophysiologist: None  Clinic Note: Chief Complaint  Patient presents with  . Follow-up    Abnormal Myoview   ===================================  ASSESSMENT/PLAN   Problem List Items Addressed This Visit    Abnormal nuclear stress test - Primary    On my read of the Myoview stress test, there appears to be a sizable anterior perfusion defect that all be fixed is definitely new compared to last study.  And the fact that he has had multiple episodes of chest discomfort over the last couple months is somewhat concerning.  He did not have any prior history of LAD disease, and this current study would suggest a sizable anterior infarct.  We discussed the results, and I think it is prudent to proceed with invasive evaluation to confirm or deny the presence of anterior LAD disease.  He is concerned because he is going to be out of town starting the midweek next week for further several weeks for 2 consecutive weddings.  Plan: We will schedule left heart catheterization and possible PCI this upcoming Monday. => He will require premedication for previous contrast hypersensitivity.  Performing MD:  Glenetta Hew, M.D., M.S.  Procedure: Left Heart Catheterization with Coronary Angiography And Percutaneous Coronary Intervention  The procedure with Risks/Benefits/Alternatives and Indications was reviewed with the patient and his wife.  All questions were answered.    Risks / Complications include, but not limited to: Death, MI, CVA/TIA, VF/VT (with defibrillation), Bradycardia (need for temporary pacer placement), contrast induced nephropathy, bleeding / bruising / hematoma / pseudoaneurysm, vascular or coronary injury (with possible emergent CT or Vascular Surgery), adverse medication reactions, infection.  Additional risks involving the use of radiation with the possibility of radiation burns and cancer were  explained in detail.  The patient (and wife) voice understanding and agree to proceed.         Relevant Orders   Basic metabolic panel (Completed)   CBC (Completed)   Chest pain of uncertain etiology    His chest pain symptoms were relatively atypical, and he is not having that much anymore.  Unfortunately, he has a relatively abnormal nuclear stress test which I think needs to be investigated.  Plan: Cardiac catheterization and possible PCI.      Relevant Orders   Basic metabolic panel (Completed)   CBC (Completed)   CAD S/P percutaneous coronary angioplasty: Cypher DES x2 to circumflex (Chronic)    Distant history of DES PCI to the LCx.  Now with Myoview showing potential anterior fixed defect which would suggest LAD infarct.  Plan for now is to proceed with cardiac catheterization to further investigate. Not on antiplatelet agent because of Eliquis. Currently on statin, beta-blocker and ARB.      Relevant Orders   Basic metabolic panel (Completed)   CBC (Completed)   PAF (paroxysmal atrial fibrillation) ; CHA2DS2-VASc Score =5 (Eliquis) (Chronic)    Energy level improved off of calcium channel blocker.  He is now only on moderate dose carvedilol.  No signs or symptoms of breakthrough episodes.  On Eliquis for anticoagulation.      Anticoagulation adequate, Eliquis with CHA2DS2VASc of 5 (Chronic)    Elevated CHA2DS2-VASc score.  Maintaining DOAC anticoagulation with Eliquis.   Okay to hold 24 to 72 hours preop for surgeries or procedures.  Hold 2 days prior to cath.       Essential hypertension (Chronic)    Blood pressure is high today, but he is somewhat  anxious about the upcoming stress test results.  We can reassess in the Cath Lab.  He is currently on Micardis and carvedilol.  Continue current meds.      Hyperlipidemia LDL goal <70 (Chronic)    Back on statin.  Lipids look great from February.         ===================================  HPI:     SEBERT STOLLINGS is a 85 y.o. male with a PMH notable for distant history of CAD-PCI as well as (recent diagnosis) A. Fib, along with OSA on CPAP, as well as significant disability from spinal cord tumor (post resection) below who presents today for 1 month-post Myoview f/u.    01/17/2020: -> He has restarted his statin.;  BP meds changed from diltiazem to amlodipine; and ACE inhibitor converted to Micardis. ;  Plan was to continue without rate control for A. fib, hoping to improve energy level  March 02, 2020: He is doing quite well.  Still very depressed about his wife health condition.  He was feeling very unhelpful as far as caring for her, and was not good at doing her part of the chores along with caring for her.  He is concerned because she is so weak & has been making poor decisions - poor memory etc. --> He did note poor energy level & still has pretty significant gait instability which limits his activity level - my be construed as DOE.  NOCHUM FENTER was last seen on June 24, 2020 noting that he has been having unusual left-sided chest discomfort symptoms described as a pinprick pressure sensation that comes on with or without exertion.  He actually took nitroglycerin to see if it helped and was not sure.  No associated dyspnea or other palpitation symptoms.  No sensation of A. fib.  Energy level did improve after stopping beta-blocker along with diltiazem.  Rates were stable. -> Based on progression of chest discomfort, and we decided to order nuclear stress test.  His medication list has changed quite a bit since last visit.  Had been on diltiazem and amlodipine, now is no longer on diltiazem or amlodipine and is back on carvedilol.  He is also on ARB but had previously been stopped.  Recent Hospitalizations: n/a  Reviewed  CV studies:    The following studies were reviewed today: (if available, images/films reviewed: From Epic Chart or Care Everywhere) . Myoview 07/09/2020:  Fixed mid to apical anteroseptal perfusion defect with hypokinesia in this area consistent with prior infarct.  EF estimated 50%.  Also fixed inferior perfusion defect and apical defect with normal wall motion consistent with artifact.  Read as low risk, no ischemia, however there is a significant anterior defect not present on previous study..  Interval History:   ETHAN CLAYBURN presents today overall stating that he is doing okay.  He really has not noticed much in the way of any more chest discomfort, but has been pretty sedentary since I last saw him.  Has not taken nitroglycerin.  Not noticing any irregular heartbeats or palpitations.  Still has fatigue but no notable PND, orthopnea, or edema..  He still has exertional dyspnea, quite deconditioned.  Otherwise, he seems to be relatively stable.    CV Review of Symptoms (Summary): positive for - chest pain, dyspnea on exertion and Exertional dyspnea because of deconditioning poor balance. negative for - edema, irregular heartbeat, orthopnea, palpitations, paroxysmal nocturnal dyspnea, rapid heart rate, shortness of breath or Medicine dizziness with vertigo and poor balance, he has  no lightheadedness or near syncope/syncope, TIA/amaurosis fugax, claudication  The patient does not have symptoms concerning for COVID-19 infection (fever, chills, cough, or new shortness of breath).   REVIEWED OF SYSTEMS   Review of Systems  Constitutional: Positive for malaise/fatigue (Overall energy is better, but still tired because he is not sleeping well.). Negative for diaphoresis.  HENT: Negative for congestion.   Respiratory: Negative for cough and shortness of breath.   Cardiovascular: Positive for leg swelling (Mild). Negative for palpitations.  Gastrointestinal: Negative for blood in stool and melena.  Genitourinary: Negative for hematuria.  Musculoskeletal: Positive for back pain. Negative for falls and joint pain.  Neurological: Positive for  weakness (Bilateral legs). Negative for dizziness (Just bad balance).  Psychiatric/Behavioral: Positive for depression (As his wife's health improves, his depression level seems to be improving.Marland Kitchen). The patient has insomnia (Not sleeping because he is stressed about caring for his wife.).    I have reviewed and (if needed) personally updated the patient's problem list, medications, allergies, past medical and surgical history, social and family history.   PAST MEDICAL HISTORY   Past Medical History:  Diagnosis Date  . Arthritis   . CAD S/P percutaneous coronary angioplasty 08/2004   CARDIOLOGIST-  DR Melanye Hiraldo; DES to LCx. X2  - Cypher 2.5 mm postdilated to 2.75 mm (20 mm and 13 mm stents);; Myoview 09/2014: LOW RISK. No Infarct or Ischemia.  . Colon polyps 05/10/2005   Hyperplastic polyps  . Complication of anesthesia    "takes long time to wake up"  . Depression   . Diabetes mellitus type 2 with complications (HCC)    neuropathy; CAD borderline- no med  . Diastolic dysfunction without heart failure    Moderate LVH, Gr 1 DD on Echo 2011; no CHF admissions, no tt on diuretisc  . Essential hypertension   . H/O: gout STABLE  . History of melanoma excision 2010   FOREHEAD  . History of prostate cancer 2004--  S/P SEED IMPLANTS    NO RECURRENCE  . Hydrocele, left   . Hyperlipidemia LDL goal <70   . Impaired hearing RIGHT HEARING AID  . OSA on CPAP    bipap  . PAF (paroxysmal atrial fibrillation) ([facility]) 12/11/2017   Admitted with Afib RVR - converted on IV Diltiazem. d/c on Eliquis; CHA2DS2Vasc = 5.  . Prostate cancer ([facility])    prostate , melanoma head  . Shingles 07  . Syncope and collapse March 2017   Likely related to post micturition, Neurontin and dehydration with orthostatic hypotension  . Urgency of urination     PAST SURGICAL HISTORY   Past Surgical History:  Procedure Laterality Date  . Cardiac Event Monitor  3-07/2015   Mostly SR - 58-132 bpm. Rare PVCs (occ  bigeminy), Frequent PACs - singlets, couplets - short runs of PAT.  . circumsision    . CORONARY ANGIOPLASTY WITH STENT PLACEMENT  09-01-2004     Cypher DES x 2 d-m LCx 2.5 mm x 20 mm & 2.5 mm x 13 mm (~2.75 mm)  . EXCISION MELANOMA FROM FOREHEAD  2010  . EYE SURGERY Bilateral    cataracts  . HYDROCELE EXCISION  10/14/2011   Procedure: HYDROCELECTOMY ADULT;  Surgeon: Ailene Rud, MD;  [facility];  Service: Urology;  Laterality: Left;  . LAMINECTOMY N/A 07/13/2012   Procedure: THORACIC LAMINECTOMY FOR RESECTION OF INTRAMEDULLARY SPINAL CHORD TUMOR WITH SPINAL CHORD MONITORING;  Surgeon: Erline Levine, MD;  Location: [facility];  Service: Neurosurgery;  Laterality: N/A;  Thoracic laminectomy for resection of intramedullary spinal cord tumor with spinal cord monitering and Dr. Christella Noa to assist  . LAMINECTOMY N/A 12/09/2014   Procedure: Redo Thoracic laminectomy with intramedullary tumar resection/USN/[place]/Subarachnoid shunt/spinal cord monitoring/Dr. Kathyrn Sheriff to assist;  Surgeon: Erline Levine, MD;  Location: [facility];  Service: Neurosurgery;  Laterality: N/A;  Redo Thoracic laminectomy with intramedullary tumar resection/USN/[place]/Subarachnoid shunt/spinal cord monitoring/Dr. Kathyrn Sheriff to assist  . NM MYOVIEW LTD  3/212014; June 2016   Both Lexiscan: a. LOW RISK, mild inferior bowel artifact; No ischemia or Infarction; b. Low risk, normal study. No infarction or ischemia. No artifact.  Marland Kitchen NM MYOVIEW LTD  07/09/2020   Fixed mid to apical anteroseptal perfusion defect with hypokinesia in this area consistent with prior infarct.  EF estimated 50%.  Also fixed inferior perfusion defect and apical defect with normal wall motion consistent with artifact.  Read as low risk, no ischemia, however there is a significant anterior defect not present on previous study..  . PROSTATE PALLADIUM GOLD SEED IMPLANTS (118)  11-15-2002   PROSTATE CANCER  . SKIN BIOPSY    .  TONSILLECTOMY    . TRANSTHORACIC ECHOCARDIOGRAM  12/2017   (A. fib): Moderate concentric LVH.  EF 55 to 60%.  No wall motion normality.  Mild to moderately dilated left atrium.    Immunization History  Administered Date(s) Administered  . Influenza,inj,Quad PF,6+ Mos 12/10/2014  . PFIZER(Purple Top)SARS-COV-2 Vaccination 04/17/2019, 05/08/2019, 12/23/2019, 07/05/2020  . Pneumococcal Polysaccharide-23 07/14/2012    MEDICATIONS/ALLERGIES   Current Meds  Medication Sig  . allopurinol (ZYLOPRIM) 300 MG tablet Take 1 tablet (300 mg total) by mouth daily.  Marland Kitchen apixaban (ELIQUIS) 5 MG TABS tablet Take 1 tablet (5 mg total) by mouth 2 (two) times daily.  . Cholecalciferol (VITAMIN D3) 50 MCG (2000 UT) CAPS Take 2,000 Units by mouth daily.  . metFORMIN (GLUCOPHAGE) 500 MG tablet Take 1 tablet (500 mg total) by mouth 2 (two) times daily with a meal. (Patient taking differently: Take 500 mg by mouth in the morning.)  . ONETOUCH ULTRA test strip USE TO TEST BLOOD SUGAR ONCE OR TWICE A DAY (E11.22)  . oxybutynin (DITROPAN XL) 15 MG 24 hr tablet Take 1 tablet (15 mg total) by mouth at bedtime.  . tamsulosin (FLOMAX) 0.4 MG CAPS capsule Take 1 capsule (0.4 mg total) by mouth at bedtime.  Marland Kitchen telmisartan (MICARDIS) 40 MG tablet Take 1 tablet (40 mg total) by mouth daily. (Patient not taking: Reported on 07/31/2020)  . [DISCONTINUED] carvedilol (COREG) 12.5 MG tablet Take 12.5 mg by mouth 2 (two) times daily with a meal.  . [DISCONTINUED] escitalopram (LEXAPRO) 10 MG tablet Take 1 tablet by mouth daily.  . [DISCONTINUED] predniSONE (DELTASONE) 50 MG tablet Prednisone 50mg  by mouth at 13 hours prior to cath 6:00pm on Sunday7 hours prior to cath 12:00am on MondayAnd prior to leaving home please take last dose of Prednisone 50mg  and Benadryl 50mg  by mouth.  . [DISCONTINUED] rosuvastatin (CRESTOR) 20 MG tablet Take 20 mg by mouth at bedtime.    Allergies  Allergen Reactions  . Betadine [Povidone Iodine]  Anaphylaxis  . Contrast Media [Iodinated Diagnostic Agents] Anaphylaxis and Rash    Other Reaction: Other reaction  . Iodine Anaphylaxis    Shellfish, IVP dye. Swelling in throat and foaming at mouth.  . Shellfish Allergy Anaphylaxis    SOCIAL HISTORY/FAMILY HISTORY   Reviewed in Epic:  Pertinent findings:  Social History   Tobacco Use  .  Smoking status: Never Smoker  . Smokeless tobacco: Never Used  . Tobacco comment: occ alcohol  Vaping Use  . Vaping Use: Never used  Substance Use Topics  . Alcohol use: Yes    Comment: OCCASIONAL  . Drug use: No   Social History   Social History Narrative   He is married, father of 2, grandfather of 55. Does not really get exercise now because   of his back issues. Does not smoke and drinks social alcohol. Works in [employer].    OBJCTIVE -PE, EKG, labs   Wt Readings from Last 3 Encounters:  07/30/20 201 lb 3.2 oz (91.3 kg)  07/06/20 206 lb (93.4 kg)  06/24/20 205 lb (93 kg)    Physical Exam: BP (!) 158/72   Pulse (!) 59   Ht 5\' 9"  (1.753 m)   Wt 201 lb 3.2 oz (91.3 kg)   SpO2 99%   BMI 29.71 kg/m  Physical Exam Constitutional:      General: He is not in acute distress.    Appearance: Normal appearance. He is obese. He is not ill-appearing or toxic-appearing.  HENT:     Head: Normocephalic and atraumatic.  Cardiovascular:     Rate and Rhythm: Regular rhythm. Bradycardia present.     Pulses: Normal pulses.     Heart sounds: Normal heart sounds. No friction rub. No gallop.   Pulmonary:     Effort: Pulmonary effort is normal. No respiratory distress.     Breath sounds: Normal breath sounds.  Chest:     Chest wall: No tenderness.  Musculoskeletal:        General: Swelling (Trivial BLE) present. Normal range of motion.     Cervical back: Normal range of motion and neck supple.  Skin:    General: Skin is warm and dry.  Neurological:     General: No focal deficit present.     Mental Status: He is alert and oriented  to person, place, and time.     Coordination: Coordination abnormal (Legs are somewhat jittery and wobbly with walking.).     Gait: Gait abnormal (Very unsteady wobbly gait, walks with a cane.  Wide-based).  Psychiatric:        Mood and Affect: Mood normal.        Behavior: Behavior normal.        Thought Content: Thought content normal.        Judgment: Judgment normal.     Comments: Much more positive mood today.      Adult ECG Report N/A  Recent Labs:    05/07/2020: TC 135, TG 148, HDL 44, LDL 45.  A1c 6.2, glucose 97.  BUN 38/creatinine 2.02 ==================================================  COVID-19 Education: The signs and symptoms of COVID-19 were discussed with the patient and how to seek care for testing (follow up with PCP or arrange E-visit).   The importance of social distancing and COVID-19 vaccination was discussed today. The patient is practicing social distancing & Masking.   I spent a total of 4minutes with the patient spent in direct patient consultation.  Additional time spent with chart review  / charting (studies, outside notes, etc): 36min Total Time: 44min   Current medicines are reviewed at length with the patient today.  (+/- concerns) none  This visit occurred during the SARS-CoV-2 public health emergency.  Safety protocols were in place, including screening questions prior to the visit, additional usage of staff PPE, and extensive cleaning of exam room while observing appropriate contact time  as indicated for disinfecting solutions.  Notice: This dictation was prepared with Dragon dictation along with smaller phrase technology. Any transcriptional errors that result from this process are unintentional and may not be corrected upon review.  Patient Instructions / Medication Changes & Studies & Tests Ordered   Shared Decision Making/Informed Consent The risks [stroke (1 in 1000), death (1 in 1000), kidney failure [usually temporary] (1 in 500), bleeding  (1 in 200), allergic reaction [possibly serious] (1 in 200)], benefits (diagnostic support and management of coronary artery disease) and alternatives of a cardiac catheterization were discussed in detail with Mr. Beever and he is willing to proceed.   Patient Instructions  Medication Instructions:    *If you need a refill on your cardiac medications before your next appointment, please call your pharmacy*   Lab Work: BMP CBC You will need a COVID-19  test prior to your procedure self isolate( quarantine)  until cardiac Cath . You are scheduled for *Friday July 31, 2020. This is a Drive Up Visit at [address] [place], [place] [address]. Someone will direct you to the appropriate testing line. Stay in your car and someone will be with you shortly.   If you have labs (blood work) drawn today and your tests are completely normal, you will receive your results only by: Marland Kitchen MyChart Message (if you have MyChart) OR . A paper copy in the mail If you have any lab test that is abnormal or we need to change your treatment, we will call you to review the results.   Testing/Procedures: Schedule for  Cardiac cath on May 2,2022 at [facility] cath lab Your physician has requested that you have a cardiac catheterization. Cardiac catheterization is used to diagnose and/or treat various heart conditions. Doctors may recommend this procedure for a number of different reasons. The most common reason is to evaluate chest pain. Chest pain can be a symptom of coronary artery disease (CAD), and cardiac catheterization can show whether plaque is narrowing or blocking your heart's arteries. This procedure is also used to evaluate the valves, as well as measure the blood flow and oxygen levels in different parts of your heart. For further information please visit [email or web address]. Please follow instruction sheet, as given.    Follow-Up: At [facility], you and your health needs are our priority.  As  part of our continuing mission to provide you with exceptional heart care, we have created designated Provider Care Teams.  These Care Teams include your primary Cardiologist (physician) and Advanced Practice Providers (APPs -  Physician Assistants and Nurse Practitioners) who all work together to provide you with the care you need, when you need it.     Your next appointment:   2-3  week(s)  The format for your next appointment:   In Person  Provider:   Glenetta Hew, MD or extender    Other Instructions      [address] [place] [address] Dept: [phone] Loc: [place]  07/30/2020  You are scheduled for a Cardiac Catheterization on Monday, May 2 with Dr. Glenetta Hew.  1. Please arrive at the [facility] (Main Entrance A) at [facility]: [address] [place], [place] [address] at 7:00 AM (This time is two hours before your procedure to ensure your preparation). Free valet parking service is available.   Special note: Every effort is made to have your procedure  done on time. Please understand that emergencies sometimes delay scheduled procedures.  2. Diet: Do not eat solid foods after midnight.  The patient may have clear liquids until 5am upon the day of the procedure.  3. Labs: You will need to have blood drawn today . You do not need to be fasting. BMP CBC  You will need a COVID-19  test prior to your procedure self isolate( quarantine)  until cardiac Cath . You are scheduled for *Friday July 31, 2020  At 1:45 pm. This is a Drive Up Visit at [address] [place], [place] [address]. Someone will direct you to the appropriate testing line. Stay in your car and someone will be with you shortly. 4. Medication instructions in preparation for your procedure:   Contrast Allergy: Yes, Please take Prednisone 50mg  by mouth at: Thirteen  hours prior to cath 6:00pm on Sunday Seven hours prior to cath 12:00am on Monday And prior to leaving home please take last dose of Prednisone 50mg  and Benadryl 50mg  by mouth.   Stop taking Eliquis (Apixiban) on Saturday, April 30.  Stop taking, Micardis (Telmisartan) Monday, May 2,   Do not take Diabetes Med Glucophage (Metformin) on the day of the procedure and HOLD 48 HOURS AFTER THE PROCEDURE.  On the morning of your procedure, take your Aspirin 81  and any morning medicines NOT listed above.  You may use sips of water.  5. Plan for one night stay--bring personal belongings. 6. Bring a current list of your medications and current insurance cards. 7. You MUST have a responsible person to drive you home. 8. Someone MUST be with you the first 24 hours after you arrive home or your discharge will be delayed. 9. Please wear clothes that are easy to get on and off and wear slip-on shoes.  Thank you for allowing [place] to care for you!   -- [facility]        Studies Ordered:   Orders Placed This Encounter  Procedures  . Basic metabolic panel  . CBC     Glenetta Hew, M.D., M.S. Interventional Cardiologist   Pager # [phone] Phone # [phone] [address] [place], [place] [address]   Thank you for choosing [facility] at [facility]!!

## 2020-07-30 NOTE — Progress Notes (Signed)
Primary Care Provider: Lavone Orn, MD Cardiologist: Glenetta Hew, MD Electrophysiologist: None  Clinic Note: Chief Complaint  Patient presents with  . Follow-up    Abnormal Myoview   ===================================  ASSESSMENT/PLAN   Problem List Items Addressed This Visit    Abnormal nuclear stress test - Primary    On my read of the Myoview stress test, there appears to be a sizable anterior perfusion defect that all be fixed is definitely new compared to last study.  And the fact that he has had multiple episodes of chest discomfort over the last couple months is somewhat concerning.  He did not have any prior history of LAD disease, and this current study would suggest a sizable anterior infarct.  We discussed the results, and I think it is prudent to proceed with invasive evaluation to confirm or deny the presence of anterior LAD disease.  He is concerned because he is going to be out of town starting the midweek next week for further several weeks for 2 consecutive weddings.  Plan: We will schedule left heart catheterization and possible PCI this upcoming Monday. => He will require premedication for previous contrast hypersensitivity.  Performing MD:  Glenetta Hew, M.D., M.S.  Procedure: Left Heart Catheterization with Coronary Angiography And Percutaneous Coronary Intervention  The procedure with Risks/Benefits/Alternatives and Indications was reviewed with the patient and his wife.  All questions were answered.    Risks / Complications include, but not limited to: Death, MI, CVA/TIA, VF/VT (with defibrillation), Bradycardia (need for temporary pacer placement), contrast induced nephropathy, bleeding / bruising / hematoma / pseudoaneurysm, vascular or coronary injury (with possible emergent CT or Vascular Surgery), adverse medication reactions, infection.  Additional risks involving the use of radiation with the possibility of radiation burns and cancer were  explained in detail.  The patient (and wife) voice understanding and agree to proceed.         Relevant Orders   Basic metabolic panel (Completed)   CBC (Completed)   Chest pain of uncertain etiology    His chest pain symptoms were relatively atypical, and he is not having that much anymore.  Unfortunately, he has a relatively abnormal nuclear stress test which I think needs to be investigated.  Plan: Cardiac catheterization and possible PCI.      Relevant Orders   Basic metabolic panel (Completed)   CBC (Completed)   CAD S/P percutaneous coronary angioplasty: Cypher DES x2 to circumflex (Chronic)    Distant history of DES PCI to the LCx.  Now with Myoview showing potential anterior fixed defect which would suggest LAD infarct.  Plan for now is to proceed with cardiac catheterization to further investigate. Not on antiplatelet agent because of Eliquis. Currently on statin, beta-blocker and ARB.      Relevant Orders   Basic metabolic panel (Completed)   CBC (Completed)   PAF (paroxysmal atrial fibrillation) ; CHA2DS2-VASc Score =5 (Eliquis) (Chronic)    Energy level improved off of calcium channel blocker.  He is now only on moderate dose carvedilol.  No signs or symptoms of breakthrough episodes.  On Eliquis for anticoagulation.      Anticoagulation adequate, Eliquis with CHA2DS2VASc of 5 (Chronic)    Elevated CHA2DS2-VASc score.  Maintaining DOAC anticoagulation with Eliquis.   Okay to hold 24 to 72 hours preop for surgeries or procedures.  Hold 2 days prior to cath.       Essential hypertension (Chronic)    Blood pressure is high today, but he is somewhat  anxious about the upcoming stress test results.  We can reassess in the Cath Lab.  He is currently on Micardis and carvedilol.  Continue current meds.      Hyperlipidemia LDL goal <70 (Chronic)    Back on statin.  Lipids look great from February.         ===================================  HPI:     BALDO HUFNAGLE is a 85 y.o. male with a PMH notable for distant history of CAD-PCI as well as (recent diagnosis) A. Fib, along with OSA on CPAP, as well as significant disability from spinal cord tumor (post resection) below who presents today for 1 month-post Myoview f/u.    01/17/2020: -> He has restarted his statin.;  BP meds changed from diltiazem to amlodipine; and ACE inhibitor converted to Micardis. ;  Plan was to continue without rate control for A. fib, hoping to improve energy level  March 02, 2020: He is doing quite well.  Still very depressed about his wife health condition.  He was feeling very unhelpful as far as caring for her, and was not good at doing her part of the chores along with caring for her.  He is concerned because she is so weak & has been making poor decisions - poor memory etc. --> He did note poor energy level & still has pretty significant gait instability which limits his activity level - my be construed as DOE.  XAINE SANSOM was last seen on June 24, 2020 noting that he has been having unusual left-sided chest discomfort symptoms described as a pinprick pressure sensation that comes on with or without exertion.  He actually took nitroglycerin to see if it helped and was not sure.  No associated dyspnea or other palpitation symptoms.  No sensation of A. fib.  Energy level did improve after stopping beta-blocker along with diltiazem.  Rates were stable. -> Based on progression of chest discomfort, and we decided to order nuclear stress test.  His medication list has changed quite a bit since last visit.  Had been on diltiazem and amlodipine, now is no longer on diltiazem or amlodipine and is back on carvedilol.  He is also on ARB but had previously been stopped.  Recent Hospitalizations: n/a  Reviewed  CV studies:    The following studies were reviewed today: (if available, images/films reviewed: From Epic Chart or Care Everywhere) . Myoview 07/09/2020:  Fixed mid to apical anteroseptal perfusion defect with hypokinesia in this area consistent with prior infarct.  EF estimated 50%.  Also fixed inferior perfusion defect and apical defect with normal wall motion consistent with artifact.  Read as low risk, no ischemia, however there is a significant anterior defect not present on previous study..  Interval History:   WANG GRANADA presents today overall stating that he is doing okay.  He really has not noticed much in the way of any more chest discomfort, but has been pretty sedentary since I last saw him.  Has not taken nitroglycerin.  Not noticing any irregular heartbeats or palpitations.  Still has fatigue but no notable PND, orthopnea, or edema..  He still has exertional dyspnea, quite deconditioned.  Otherwise, he seems to be relatively stable.    CV Review of Symptoms (Summary): positive for - chest pain, dyspnea on exertion and Exertional dyspnea because of deconditioning poor balance. negative for - edema, irregular heartbeat, orthopnea, palpitations, paroxysmal nocturnal dyspnea, rapid heart rate, shortness of breath or Medicine dizziness with vertigo and poor balance, he has  no lightheadedness or near syncope/syncope, TIA/amaurosis fugax, claudication  The patient does not have symptoms concerning for COVID-19 infection (fever, chills, cough, or new shortness of breath).   REVIEWED OF SYSTEMS   Review of Systems  Constitutional: Positive for malaise/fatigue (Overall energy is better, but still tired because he is not sleeping well.). Negative for diaphoresis.  HENT: Negative for congestion.   Respiratory: Negative for cough and shortness of breath.   Cardiovascular: Positive for leg swelling (Mild). Negative for palpitations.  Gastrointestinal: Negative for blood in stool and melena.  Genitourinary: Negative for hematuria.  Musculoskeletal: Positive for back pain. Negative for falls and joint pain.  Neurological: Positive for  weakness (Bilateral legs). Negative for dizziness (Just bad balance).  Psychiatric/Behavioral: Positive for depression (As his wife's health improves, his depression level seems to be improving.Marland Kitchen). The patient has insomnia (Not sleeping because he is stressed about caring for his wife.).    I have reviewed and (if needed) personally updated the patient's problem list, medications, allergies, past medical and surgical history, social and family history.   PAST MEDICAL HISTORY   Past Medical History:  Diagnosis Date  . Arthritis   . CAD S/P percutaneous coronary angioplasty 08/2004   CARDIOLOGIST-  DR Aina Rossbach; DES to LCx. X2  - Cypher 2.5 mm postdilated to 2.75 mm (20 mm and 13 mm stents);; Myoview 09/2014: LOW RISK. No Infarct or Ischemia.  . Colon polyps 05/10/2005   Hyperplastic polyps  . Complication of anesthesia    "takes long time to wake up"  . Depression   . Diabetes mellitus type 2 with complications (HCC)    neuropathy; CAD borderline- no med  . Diastolic dysfunction without heart failure    Moderate LVH, Gr 1 DD on Echo 2011; no CHF admissions, no tt on diuretisc  . Essential hypertension   . H/O: gout STABLE  . History of melanoma excision 2010   FOREHEAD  . History of prostate cancer 2004--  S/P SEED IMPLANTS    NO RECURRENCE  . Hydrocele, left   . Hyperlipidemia LDL goal <70   . Impaired hearing RIGHT HEARING AID  . OSA on CPAP    bipap  . PAF (paroxysmal atrial fibrillation) ([place]) 12/11/2017   Admitted with Afib RVR - converted on IV Diltiazem. d/c on Eliquis; CHA2DS2Vasc = 5.  . Prostate cancer ([place])    prostate , melanoma head  . Shingles 07  . Syncope and collapse March 2017   Likely related to post micturition, Neurontin and dehydration with orthostatic hypotension  . Urgency of urination     PAST SURGICAL HISTORY   Past Surgical History:  Procedure Laterality Date  . Cardiac Event Monitor  3-07/2015   Mostly SR - 58-132 bpm. Rare PVCs (occ  bigeminy), Frequent PACs - singlets, couplets - short runs of PAT.  . circumsision    . CORONARY ANGIOPLASTY WITH STENT PLACEMENT  09-01-2004     Cypher DES x 2 d-m LCx 2.5 mm x 20 mm & 2.5 mm x 13 mm (~2.75 mm)  . EXCISION MELANOMA FROM FOREHEAD  2010  . EYE SURGERY Bilateral    cataracts  . HYDROCELE EXCISION  10/14/2011   Procedure: HYDROCELECTOMY ADULT;  Surgeon: Ailene Rud, MD;  Location: [facility];  Service: Urology;  Laterality: Left;  . LAMINECTOMY N/A 07/13/2012   Procedure: THORACIC LAMINECTOMY FOR RESECTION OF INTRAMEDULLARY SPINAL CHORD TUMOR WITH SPINAL CHORD MONITORING;  Surgeon: Erline Levine, MD;  Location: [facility];  Service: Neurosurgery;  Laterality: N/A;  Thoracic laminectomy for resection of intramedullary spinal cord tumor with spinal cord monitering and Dr. Christella Noa to assist  . LAMINECTOMY N/A 12/09/2014   Procedure: Redo Thoracic laminectomy with intramedullary tumar resection/USN/[place]/Subarachnoid shunt/spinal cord monitoring/Dr. Kathyrn Sheriff to assist;  Surgeon: Erline Levine, MD;  Location: [facility];  Service: Neurosurgery;  Laterality: N/A;  Redo Thoracic laminectomy with intramedullary tumar resection/USN/[place]/Subarachnoid shunt/spinal cord monitoring/Dr. Kathyrn Sheriff to assist  . NM MYOVIEW LTD  3/212014; June 2016   Both Lexiscan: a. LOW RISK, mild inferior bowel artifact; No ischemia or Infarction; b. Low risk, normal study. No infarction or ischemia. No artifact.  Marland Kitchen NM MYOVIEW LTD  07/09/2020   Fixed mid to apical anteroseptal perfusion defect with hypokinesia in this area consistent with prior infarct.  EF estimated 50%.  Also fixed inferior perfusion defect and apical defect with normal wall motion consistent with artifact.  Read as low risk, no ischemia, however there is a significant anterior defect not present on previous study..  . PROSTATE PALLADIUM GOLD SEED IMPLANTS (118)  11-15-2002   PROSTATE CANCER  . SKIN BIOPSY    .  TONSILLECTOMY    . TRANSTHORACIC ECHOCARDIOGRAM  12/2017   (A. fib): Moderate concentric LVH.  EF 55 to 60%.  No wall motion normality.  Mild to moderately dilated left atrium.    Immunization History  Administered Date(s) Administered  . Influenza,inj,Quad PF,6+ Mos 12/10/2014  . PFIZER(Purple Top)SARS-COV-2 Vaccination 04/17/2019, 05/08/2019, 12/23/2019, 07/05/2020  . Pneumococcal Polysaccharide-23 07/14/2012    MEDICATIONS/ALLERGIES   Current Meds  Medication Sig  . allopurinol (ZYLOPRIM) 300 MG tablet Take 1 tablet (300 mg total) by mouth daily.  Marland Kitchen apixaban (ELIQUIS) 5 MG TABS tablet Take 1 tablet (5 mg total) by mouth 2 (two) times daily.  . Cholecalciferol (VITAMIN D3) 50 MCG (2000 UT) CAPS Take 2,000 Units by mouth daily.  . metFORMIN (GLUCOPHAGE) 500 MG tablet Take 1 tablet (500 mg total) by mouth 2 (two) times daily with a meal. (Patient taking differently: Take 500 mg by mouth in the morning.)  . ONETOUCH ULTRA test strip USE TO TEST BLOOD SUGAR ONCE OR TWICE A DAY (E11.22)  . oxybutynin (DITROPAN XL) 15 MG 24 hr tablet Take 1 tablet (15 mg total) by mouth at bedtime.  . tamsulosin (FLOMAX) 0.4 MG CAPS capsule Take 1 capsule (0.4 mg total) by mouth at bedtime.  Marland Kitchen telmisartan (MICARDIS) 40 MG tablet Take 1 tablet (40 mg total) by mouth daily. (Patient not taking: Reported on 07/31/2020)  . [DISCONTINUED] carvedilol (COREG) 12.5 MG tablet Take 12.5 mg by mouth 2 (two) times daily with a meal.  . [DISCONTINUED] escitalopram (LEXAPRO) 10 MG tablet Take 1 tablet by mouth daily.  . [DISCONTINUED] predniSONE (DELTASONE) 50 MG tablet Prednisone 50mg  by mouth at 13 hours prior to cath 6:00pm on Sunday7 hours prior to cath 12:00am on MondayAnd prior to leaving home please take last dose of Prednisone 50mg  and Benadryl 50mg  by mouth.  . [DISCONTINUED] rosuvastatin (CRESTOR) 20 MG tablet Take 20 mg by mouth at bedtime.    Allergies  Allergen Reactions  . Betadine [Povidone Iodine]  Anaphylaxis  . Contrast Media [Iodinated Diagnostic Agents] Anaphylaxis and Rash    Other Reaction: Other reaction  . Iodine Anaphylaxis    Shellfish, IVP dye. Swelling in throat and foaming at mouth.  . Shellfish Allergy Anaphylaxis    SOCIAL HISTORY/FAMILY HISTORY   Reviewed in Epic:  Pertinent findings:  Social History   Tobacco Use  .  Smoking status: Never Smoker  . Smokeless tobacco: Never Used  . Tobacco comment: occ alcohol  Vaping Use  . Vaping Use: Never used  Substance Use Topics  . Alcohol use: Yes    Comment: OCCASIONAL  . Drug use: No   Social History   Social History Narrative   He is married, father of 2, grandfather of 66. Does not really get exercise now because   of his back issues. Does not smoke and drinks social alcohol. Works in [employer].    OBJCTIVE -PE, EKG, labs   Wt Readings from Last 3 Encounters:  07/30/20 201 lb 3.2 oz (91.3 kg)  07/06/20 206 lb (93.4 kg)  06/24/20 205 lb (93 kg)    Physical Exam: BP (!) 158/72   Pulse (!) 59   Ht 5\' 9"  (1.753 m)   Wt 201 lb 3.2 oz (91.3 kg)   SpO2 99%   BMI 29.71 kg/m  Physical Exam Constitutional:      General: He is not in acute distress.    Appearance: Normal appearance. He is obese. He is not ill-appearing or toxic-appearing.  HENT:     Head: Normocephalic and atraumatic.  Cardiovascular:     Rate and Rhythm: Regular rhythm. Bradycardia present.     Pulses: Normal pulses.     Heart sounds: Normal heart sounds. No friction rub. No gallop.   Pulmonary:     Effort: Pulmonary effort is normal. No respiratory distress.     Breath sounds: Normal breath sounds.  Chest:     Chest wall: No tenderness.  Musculoskeletal:        General: Swelling (Trivial BLE) present. Normal range of motion.     Cervical back: Normal range of motion and neck supple.  Skin:    General: Skin is warm and dry.  Neurological:     General: No focal deficit present.     Mental Status: He is alert and oriented  to person, place, and time.     Coordination: Coordination abnormal (Legs are somewhat jittery and wobbly with walking.).     Gait: Gait abnormal (Very unsteady wobbly gait, walks with a cane.  Wide-based).  Psychiatric:        Mood and Affect: Mood normal.        Behavior: Behavior normal.        Thought Content: Thought content normal.        Judgment: Judgment normal.     Comments: Much more positive mood today.      Adult ECG Report N/A  Recent Labs:    05/07/2020: TC 135, TG 148, HDL 44, LDL 45.  A1c 6.2, glucose 97.  BUN 38/creatinine 2.02 ==================================================  COVID-19 Education: The signs and symptoms of COVID-19 were discussed with the patient and how to seek care for testing (follow up with PCP or arrange E-visit).   The importance of social distancing and COVID-19 vaccination was discussed today. The patient is practicing social distancing & Masking.   I spent a total of 18minutes with the patient spent in direct patient consultation.  Additional time spent with chart review  / charting (studies, outside notes, etc): 45min Total Time: 11min   Current medicines are reviewed at length with the patient today.  (+/- concerns) none  This visit occurred during the SARS-CoV-2 public health emergency.  Safety protocols were in place, including screening questions prior to the visit, additional usage of staff PPE, and extensive cleaning of exam room while observing appropriate contact time  as indicated for disinfecting solutions.  Notice: This dictation was prepared with Dragon dictation along with smaller phrase technology. Any transcriptional errors that result from this process are unintentional and may not be corrected upon review.  Patient Instructions / Medication Changes & Studies & Tests Ordered   Shared Decision Making/Informed Consent The risks [stroke (1 in 1000), death (1 in 1000), kidney failure [usually temporary] (1 in 500), bleeding  (1 in 200), allergic reaction [possibly serious] (1 in 200)], benefits (diagnostic support and management of coronary artery disease) and alternatives of a cardiac catheterization were discussed in detail with Mr. Wuebker and he is willing to proceed.   Patient Instructions  Medication Instructions:    *If you need a refill on your cardiac medications before your next appointment, please call your pharmacy*   Lab Work: BMP CBC You will need a COVID-19  test prior to your procedure self isolate( quarantine)  until cardiac Cath . You are scheduled for *Friday July 31, 2020. This is a Drive Up Visit at [address] [place], [place] [address]. Someone will direct you to the appropriate testing line. Stay in your car and someone will be with you shortly.   If you have labs (blood work) drawn today and your tests are completely normal, you will receive your results only by: Marland Kitchen MyChart Message (if you have MyChart) OR . A paper copy in the mail If you have any lab test that is abnormal or we need to change your treatment, we will call you to review the results.   Testing/Procedures: Schedule for  Cardiac cath on May 2,2022 at [facility] cath lab Your physician has requested that you have a cardiac catheterization. Cardiac catheterization is used to diagnose and/or treat various heart conditions. Doctors may recommend this procedure for a number of different reasons. The most common reason is to evaluate chest pain. Chest pain can be a symptom of coronary artery disease (CAD), and cardiac catheterization can show whether plaque is narrowing or blocking your heart's arteries. This procedure is also used to evaluate the valves, as well as measure the blood flow and oxygen levels in different parts of your heart. For further information please visit [email or web address]. Please follow instruction sheet, as given.    Follow-Up: At [facility], you and your health needs are our priority.  As  part of our continuing mission to provide you with exceptional heart care, we have created designated Provider Care Teams.  These Care Teams include your primary Cardiologist (physician) and Advanced Practice Providers (APPs -  Physician Assistants and Nurse Practitioners) who all work together to provide you with the care you need, when you need it.     Your next appointment:   2-3  week(s)  The format for your next appointment:   In Person  Provider:   Glenetta Hew, MD or extender    Other Instructions      [address] [place] [address] Dept: [phone] Loc: [place]  07/30/2020  You are scheduled for a Cardiac Catheterization on Monday, May 2 with Dr. Glenetta Hew.  1. Please arrive at the [facility] (Main Entrance A) at [facility]: [address] [place], [place] [address] at 7:00 AM (This time is two hours before your procedure to ensure your preparation). Free valet parking service is available.   Special note: Every effort is made to have your procedure  done on time. Please understand that emergencies sometimes delay scheduled procedures.  2. Diet: Do not eat solid foods after midnight.  The patient may have clear liquids until 5am upon the day of the procedure.  3. Labs: You will need to have blood drawn today . You do not need to be fasting. BMP CBC  You will need a COVID-19  test prior to your procedure self isolate( quarantine)  until cardiac Cath . You are scheduled for *Friday July 31, 2020  At 1:45 pm. This is a Drive Up Visit at [address] [place], [place] [address]. Someone will direct you to the appropriate testing line. Stay in your car and someone will be with you shortly. 4. Medication instructions in preparation for your procedure:   Contrast Allergy: Yes, Please take Prednisone 50mg  by mouth at: Thirteen  hours prior to cath 6:00pm on Sunday Seven hours prior to cath 12:00am on Monday And prior to leaving home please take last dose of Prednisone 50mg  and Benadryl 50mg  by mouth.   Stop taking Eliquis (Apixiban) on Saturday, April 30.  Stop taking, Micardis (Telmisartan) Monday, May 2,   Do not take Diabetes Med Glucophage (Metformin) on the day of the procedure and HOLD 48 HOURS AFTER THE PROCEDURE.  On the morning of your procedure, take your Aspirin 81  and any morning medicines NOT listed above.  You may use sips of water.  5. Plan for one night stay--bring personal belongings. 6. Bring a current list of your medications and current insurance cards. 7. You MUST have a responsible person to drive you home. 8. Someone MUST be with you the first 24 hours after you arrive home or your discharge will be delayed. 9. Please wear clothes that are easy to get on and off and wear slip-on shoes.  Thank you for allowing [place] to care for you!   -- [facility]        Studies Ordered:   Orders Placed This Encounter  Procedures  . Basic metabolic panel  . CBC     Glenetta Hew, M.D., M.S. Interventional Cardiologist   Pager # [phone] Phone # [phone] [address] [place], [place] [address]   Thank you for choosing Heartcare at [facility]!!

## 2020-07-30 NOTE — Patient Instructions (Addendum)
Medication Instructions:    *If you need a refill on your cardiac medications before your next appointment, please call your pharmacy*   Lab Work: BMP CBC You will need a COVID-19  test prior to your procedure self isolate( quarantine)  until cardiac Cath . You are scheduled for *Friday July 31, 2020. This is a Drive Up Visit at [address] [place], [place] [address]. Someone will direct you to the appropriate testing line. Stay in your car and someone will be with you shortly.   If you have labs (blood work) drawn today and your tests are completely normal, you will receive your results only by: Marland Kitchen MyChart Message (if you have MyChart) OR . A paper copy in the mail If you have any lab test that is abnormal or we need to change your treatment, we will call you to review the results.   Testing/Procedures: Schedule for  Cardiac cath on May 2,2022 at [facility] cath lab Your physician has requested that you have a cardiac catheterization. Cardiac catheterization is used to diagnose and/or treat various heart conditions. Doctors may recommend this procedure for a number of different reasons. The most common reason is to evaluate chest pain. Chest pain can be a symptom of coronary artery disease (CAD), and cardiac catheterization can show whether plaque is narrowing or blocking your heart's arteries. This procedure is also used to evaluate the valves, as well as measure the blood flow and oxygen levels in different parts of your heart. For further information please visit [email or web address]. Please follow instruction sheet, as given.    Follow-Up: At [facility], you and your health needs are our priority.  As part of our continuing mission to provide you with exceptional heart care, we have created designated Provider Care Teams.  These Care Teams include your primary Cardiologist (physician) and Advanced Practice Providers (APPs -  Physician Assistants and Nurse Practitioners) who  all work together to provide you with the care you need, when you need it.     Your next appointment:   2-3  week(s)  The format for your next appointment:   In Person  Provider:   Glenetta Hew, MD or extender    Other Instructions      [place] [address] Dept: [phone] Loc: [place]  07/30/2020  You are scheduled for a Cardiac Catheterization on Monday, May 2 with Dr. Glenetta Hew.  1. Please arrive at the [facility] (Main Entrance A) at [place]: [address] [place], [place] [address] at 7:00 AM (This time is two hours before your procedure to ensure your preparation). Free valet parking service is available.   Special note: Every effort is made to have your procedure done on time. Please understand that emergencies sometimes delay scheduled procedures.  2. Diet: Do not eat solid foods after midnight.  The patient may have clear liquids until 5am upon the day of the procedure.  3. Labs: You will need to have blood drawn today . You do not need to be fasting. BMP CBC  You will need a COVID-19  test prior to your procedure self isolate( quarantine)  until cardiac Cath . You are scheduled for *Friday July 31, 2020  At 1:45 pm. This is a Drive Up Visit at [address] [place], [place] [address]. Someone will direct you to the appropriate testing line. Stay in your  car and someone will be with you shortly. 4. Medication instructions in preparation for your procedure:   Contrast Allergy: Yes, Please take Prednisone 50mg  by mouth at: Thirteen hours prior to cath 6:00pm on Sunday Seven hours prior to cath 12:00am on Monday And prior to leaving home please take last dose of Prednisone 50mg  and Benadryl 50mg  by mouth.   Stop taking Eliquis (Apixiban) on Saturday, April 30.  Stop taking, Micardis (Telmisartan)  Monday, May 2,   Do not take Diabetes Med Glucophage (Metformin) on the day of the procedure and HOLD 48 HOURS AFTER THE PROCEDURE.  On the morning of your procedure, take your Aspirin 81  and any morning medicines NOT listed above.  You may use sips of water.  5. Plan for one night stay--bring personal belongings. 6. Bring a current list of your medications and current insurance cards. 7. You MUST have a responsible person to drive you home. 8. Someone MUST be with you the first 24 hours after you arrive home or your discharge will be delayed. 9. Please wear clothes that are easy to get on and off and wear slip-on shoes.  Thank you for allowing [place] to care for you!   -- [place] [facility]

## 2020-07-31 ENCOUNTER — Other Ambulatory Visit: Payer: Self-pay | Admitting: *Deleted

## 2020-07-31 ENCOUNTER — Telehealth: Payer: Self-pay | Admitting: Cardiology

## 2020-07-31 ENCOUNTER — Other Ambulatory Visit (HOSPITAL_COMMUNITY)
Admission: RE | Admit: 2020-07-31 | Discharge: 2020-07-31 | Disposition: A | Discharge status: Home / Self Care | Payer: Medicare Other | Source: Ambulatory Visit | Attending: Cardiology | Admitting: Cardiology

## 2020-07-31 DIAGNOSIS — R9439 Abnormal result of other cardiovascular function study: Secondary | ICD-10-CM

## 2020-07-31 DIAGNOSIS — Z20822 Contact with and (suspected) exposure to covid-19: Secondary | ICD-10-CM | POA: Diagnosis not present

## 2020-07-31 DIAGNOSIS — R079 Chest pain, unspecified: Secondary | ICD-10-CM

## 2020-07-31 DIAGNOSIS — Z01812 Encounter for preprocedural laboratory examination: Secondary | ICD-10-CM | POA: Diagnosis not present

## 2020-07-31 LAB — CBC
Hematocrit: 39.5 % (ref 37.5–51.0)
Hemoglobin: 13.1 g/dL (ref 13.0–17.7)
MCH: 29.4 pg (ref 26.6–33.0)
MCHC: 33.2 g/dL (ref 31.5–35.7)
MCV: 89 fL (ref 79–97)
Platelets: 183 10*3/uL (ref 150–450)
RBC: 4.46 x10E6/uL (ref 4.14–5.80)
RDW: 14 % (ref 11.6–15.4)
WBC: 7.9 10*3/uL (ref 3.4–10.8)

## 2020-07-31 LAB — BASIC METABOLIC PANEL
BUN/Creatinine Ratio: 13 (ref 10–24)
BUN: 25 mg/dL (ref 8–27)
CO2: 24 mmol/L (ref 20–29)
Calcium: 8.6 mg/dL (ref 8.6–10.2)
Chloride: 103 mmol/L (ref 96–106)
Creatinine, Ser: 1.96 mg/dL — ABNORMAL HIGH (ref 0.76–1.27)
Glucose: 85 mg/dL (ref 65–99)
Potassium: 4.3 mmol/L (ref 3.5–5.2)
Sodium: 142 mmol/L (ref 134–144)
eGFR: 34 mL/min/{1.73_m2} — ABNORMAL LOW (ref >59)

## 2020-07-31 MED ORDER — PREDNISONE 50 MG PO TABS: ORAL_TABLET | ORAL | 0 refills | Status: DC

## 2020-07-31 NOTE — Progress Notes (Signed)
Order placed

## 2020-07-31 NOTE — Telephone Encounter (Signed)
Pt c/o medication issue:  1. Name of Medication: predniSONE (DELTASONE) 50 MG tablet  2. How are you currently taking this medication (dosage and times per day)?   3. Are you having a reaction (difficulty breathing--STAT)? no  4. What is your medication issue? Patient's wife states the medication was not received by his pharmacy. She states she thinks it is a preprocedural medication.

## 2020-07-31 NOTE — Telephone Encounter (Addendum)
Spoke to wife-she states the pharmacy has not notified them that the rx for prednisone was ready, wanted to verify it was sent in.    Rx sent to pharmacy (confirmed with wife) yesterday-receipt confirmed.  Called [facility] to confirm-on hold for 16+ mins without speaking to staff.  Resent rx (urgent) with note to contact patient when ready  E-Prescribing Status: Receipt confirmed by pharmacy (07/31/2020 4:18 PM EDT)

## 2020-08-01 ENCOUNTER — Encounter: Payer: Self-pay | Admitting: Cardiology

## 2020-08-01 LAB — SARS CORONAVIRUS 2 (TAT 6-24 HRS): SARS Coronavirus 2: NEGATIVE

## 2020-08-01 NOTE — Assessment & Plan Note (Signed)
Energy level improved off of calcium channel blocker.  He is now only on moderate dose carvedilol.  No signs or symptoms of breakthrough episodes.  On Eliquis for anticoagulation.

## 2020-08-01 NOTE — Assessment & Plan Note (Signed)
Blood pressure is high today, but he is somewhat anxious about the upcoming stress test results.  We can reassess in the Cath Lab.  He is currently on Micardis and carvedilol.  Continue current meds.

## 2020-08-01 NOTE — Assessment & Plan Note (Signed)
Distant history of DES PCI to the LCx.  Now with Myoview showing potential anterior fixed defect which would suggest LAD infarct.  Plan for now is to proceed with cardiac catheterization to further investigate. Not on antiplatelet agent because of Eliquis. Currently on statin, beta-blocker and ARB.

## 2020-08-01 NOTE — Assessment & Plan Note (Addendum)
On my read of the Myoview stress test, there appears to be a sizable anterior perfusion defect that all be fixed is definitely new compared to last study.  And the fact that he has had multiple episodes of chest discomfort over the last couple months is somewhat concerning.  He did not have any prior history of LAD disease, and this current study would suggest a sizable anterior infarct.  We discussed the results, and I think it is prudent to proceed with invasive evaluation to confirm or deny the presence of anterior LAD disease.  He is concerned because he is going to be out of town starting the midweek next week for further several weeks for 2 consecutive weddings.  Plan: We will schedule left heart catheterization and possible PCI this upcoming Monday. => He will require premedication for previous contrast hypersensitivity.  Performing MD:  Glenetta Hew, M.D., M.S.  Procedure: Left Heart Catheterization with Coronary Angiography And Percutaneous Coronary Intervention  The procedure with Risks/Benefits/Alternatives and Indications was reviewed with the patient and his wife.  All questions were answered.    Risks / Complications include, but not limited to: Death, MI, CVA/TIA, VF/VT (with defibrillation), Bradycardia (need for temporary pacer placement), contrast induced nephropathy, bleeding / bruising / hematoma / pseudoaneurysm, vascular or coronary injury (with possible emergent CT or Vascular Surgery), adverse medication reactions, infection.  Additional risks involving the use of radiation with the possibility of radiation burns and cancer were explained in detail.  The patient (and wife) voice understanding and agree to proceed.

## 2020-08-01 NOTE — Assessment & Plan Note (Signed)
His chest pain symptoms were relatively atypical, and he is not having that much anymore.  Unfortunately, he has a relatively abnormal nuclear stress test which I think needs to be investigated.  Plan: Cardiac catheterization and possible PCI.

## 2020-08-01 NOTE — Assessment & Plan Note (Signed)
Back on statin.  Lipids look great from February.

## 2020-08-01 NOTE — Assessment & Plan Note (Signed)
Elevated CHA2DS2-VASc score.  Maintaining DOAC anticoagulation with Eliquis.   Okay to hold 24 to 72 hours preop for surgeries or procedures.  Hold 2 days prior to cath.

## 2020-08-02 ENCOUNTER — Telehealth: Payer: Self-pay | Admitting: *Deleted

## 2020-08-02 NOTE — Telephone Encounter (Signed)
07/30/20 eGFR 34-per Cath Lab protocol pre procedure hydration arranged.  Pt contacted pre-catheterization scheduled at [facility] for: Monday Aug 03, 2020 9 AM Verified arrival time and place: [place] [facility]) at: 5:30 AM-pre-procedure hydration   No solid food after midnight prior to cath, clear liquids until 5 AM day of procedure.  CONTRAST ALLERGY: yes 13 hour Prednisone and Benadryl Prep reviewed with patient: 08/02/20 Prednisone 50 mg 8 PM 08/03/20 Prednisone 50 mg 2 AM 08/03/20 Prednisone 50 mg and Benadryl 50 mg -pt will take with him to hospital and take at 7 AM at hospital.  Hold: Eliquis-none 08/01/20 until post procedure Chlorthalidone-AM of procedure Telmisartan-AM of procedure Metformin-day of procedure and 48 hours post procedure  Except hold medications AM meds can be  taken pre-cath with sips of water including: ASA 81 mg  You are allowed ONE visitor in the waiting room during the time your are at the hospital for your procedure.

## 2020-08-03 ENCOUNTER — Other Ambulatory Visit: Payer: Self-pay

## 2020-08-03 ENCOUNTER — Encounter (HOSPITAL_COMMUNITY)
Admission: RE | Disposition: A | Discharge status: Home / Self Care | Payer: Self-pay | Source: Ambulatory Visit | Attending: Cardiology

## 2020-08-03 ENCOUNTER — Encounter (HOSPITAL_COMMUNITY): Payer: Self-pay | Admitting: Cardiology

## 2020-08-03 ENCOUNTER — Inpatient Hospital Stay (HOSPITAL_COMMUNITY)
Admission: RE | Admit: 2020-08-03 | Discharge: 2020-08-05 | DRG: 247 | Disposition: A | Discharge status: Home / Self Care | Payer: Medicare Other | Source: Ambulatory Visit | Attending: Cardiology | Admitting: Cardiology

## 2020-08-03 DIAGNOSIS — M109 Gout, unspecified: Secondary | ICD-10-CM | POA: Diagnosis not present

## 2020-08-03 DIAGNOSIS — G4733 Obstructive sleep apnea (adult) (pediatric): Secondary | ICD-10-CM | POA: Diagnosis present

## 2020-08-03 DIAGNOSIS — E669 Obesity, unspecified: Secondary | ICD-10-CM | POA: Diagnosis present

## 2020-08-03 DIAGNOSIS — Z85820 Personal history of malignant melanoma of skin: Secondary | ICD-10-CM

## 2020-08-03 DIAGNOSIS — R001 Bradycardia, unspecified: Secondary | ICD-10-CM | POA: Diagnosis present

## 2020-08-03 DIAGNOSIS — R079 Chest pain, unspecified: Secondary | ICD-10-CM

## 2020-08-03 DIAGNOSIS — E1140 Type 2 diabetes mellitus with diabetic neuropathy, unspecified: Secondary | ICD-10-CM | POA: Diagnosis present

## 2020-08-03 DIAGNOSIS — H9190 Unspecified hearing loss, unspecified ear: Secondary | ICD-10-CM | POA: Diagnosis not present

## 2020-08-03 DIAGNOSIS — Z8546 Personal history of malignant neoplasm of prostate: Secondary | ICD-10-CM

## 2020-08-03 DIAGNOSIS — I5189 Other ill-defined heart diseases: Secondary | ICD-10-CM

## 2020-08-03 DIAGNOSIS — E785 Hyperlipidemia, unspecified: Secondary | ICD-10-CM | POA: Diagnosis not present

## 2020-08-03 DIAGNOSIS — E1122 Type 2 diabetes mellitus with diabetic chronic kidney disease: Secondary | ICD-10-CM | POA: Diagnosis not present

## 2020-08-03 DIAGNOSIS — F32A Depression, unspecified: Secondary | ICD-10-CM | POA: Diagnosis present

## 2020-08-03 DIAGNOSIS — G4700 Insomnia, unspecified: Secondary | ICD-10-CM | POA: Diagnosis present

## 2020-08-03 DIAGNOSIS — I25118 Atherosclerotic heart disease of native coronary artery with other forms of angina pectoris: Secondary | ICD-10-CM | POA: Diagnosis not present

## 2020-08-03 DIAGNOSIS — Z955 Presence of coronary angioplasty implant and graft: Secondary | ICD-10-CM

## 2020-08-03 DIAGNOSIS — I480 Paroxysmal atrial fibrillation: Secondary | ICD-10-CM | POA: Diagnosis present

## 2020-08-03 DIAGNOSIS — N1832 Chronic kidney disease, stage 3b: Secondary | ICD-10-CM | POA: Diagnosis not present

## 2020-08-03 DIAGNOSIS — Z7901 Long term (current) use of anticoagulants: Secondary | ICD-10-CM

## 2020-08-03 DIAGNOSIS — Z7984 Long term (current) use of oral hypoglycemic drugs: Secondary | ICD-10-CM

## 2020-08-03 DIAGNOSIS — Z9861 Coronary angioplasty status: Secondary | ICD-10-CM

## 2020-08-03 DIAGNOSIS — M1990 Unspecified osteoarthritis, unspecified site: Secondary | ICD-10-CM | POA: Diagnosis present

## 2020-08-03 DIAGNOSIS — I2510 Atherosclerotic heart disease of native coronary artery without angina pectoris: Secondary | ICD-10-CM | POA: Diagnosis present

## 2020-08-03 DIAGNOSIS — Z91041 Radiographic dye allergy status: Secondary | ICD-10-CM

## 2020-08-03 DIAGNOSIS — R9439 Abnormal result of other cardiovascular function study: Secondary | ICD-10-CM | POA: Diagnosis present

## 2020-08-03 DIAGNOSIS — Z91013 Allergy to seafood: Secondary | ICD-10-CM

## 2020-08-03 DIAGNOSIS — Z6829 Body mass index (BMI) 29.0-29.9, adult: Secondary | ICD-10-CM

## 2020-08-03 DIAGNOSIS — I25119 Atherosclerotic heart disease of native coronary artery with unspecified angina pectoris: Secondary | ICD-10-CM | POA: Diagnosis present

## 2020-08-03 DIAGNOSIS — I129 Hypertensive chronic kidney disease with stage 1 through stage 4 chronic kidney disease, or unspecified chronic kidney disease: Secondary | ICD-10-CM | POA: Diagnosis present

## 2020-08-03 DIAGNOSIS — Z888 Allergy status to other drugs, medicaments and biological substances status: Secondary | ICD-10-CM

## 2020-08-03 DIAGNOSIS — Z79899 Other long term (current) drug therapy: Secondary | ICD-10-CM

## 2020-08-03 HISTORY — PX: LEFT HEART CATH AND CORONARY ANGIOGRAPHY: CATH118249

## 2020-08-03 LAB — HEMOGLOBIN A1C
Hgb A1c MFr Bld: 6.2 % — ABNORMAL HIGH (ref 4.8–5.6)
Mean Plasma Glucose: 131.24 mg/dL

## 2020-08-03 LAB — GLUCOSE, CAPILLARY
Glucose-Capillary: 166 mg/dL — ABNORMAL HIGH (ref 70–99)
Glucose-Capillary: 167 mg/dL — ABNORMAL HIGH (ref 70–99)
Glucose-Capillary: 122 mg/dL — ABNORMAL HIGH (ref 70–99)

## 2020-08-03 SURGERY — LEFT HEART CATH AND CORONARY ANGIOGRAPHY
Anesthesia: LOCAL

## 2020-08-03 MED ORDER — HYDRALAZINE HCL 20 MG/ML IJ SOLN: 10.0000 mg | INTRAMUSCULAR | Status: AC | PRN

## 2020-08-03 MED ORDER — SODIUM CHLORIDE 0.9% FLUSH
3.0000 mL | Freq: Two times a day (BID) | INTRAVENOUS | Status: DC
  Administered 2020-08-03: 3 mL via INTRAVENOUS

## 2020-08-03 MED ORDER — INSULIN ASPART 100 UNIT/ML IJ SOLN
0.0000 [IU] | Freq: Three times a day (TID) | INTRAMUSCULAR | Status: DC
  Administered 2020-08-03: 11 [IU] via SUBCUTANEOUS
  Administered 2020-08-04: 3 [IU] via SUBCUTANEOUS
  Administered 2020-08-04: 8 [IU] via SUBCUTANEOUS
  Administered 2020-08-04 – 2020-08-05 (×2): 3 [IU] via SUBCUTANEOUS
  Administered 2020-08-05: 2 [IU] via SUBCUTANEOUS

## 2020-08-03 MED ORDER — MIDAZOLAM HCL 2 MG/2ML IJ SOLN
INTRAMUSCULAR | Status: DC | PRN
  Administered 2020-08-03: 1 mg via INTRAVENOUS

## 2020-08-03 MED ORDER — VERAPAMIL HCL 2.5 MG/ML IV SOLN
INTRAVENOUS | Status: AC
  Filled 2020-08-03: qty 2

## 2020-08-03 MED ORDER — SODIUM CHLORIDE 0.9% FLUSH: 3.0000 mL | INTRAVENOUS | Status: DC | PRN

## 2020-08-03 MED ORDER — HEPARIN (PORCINE) IN NACL 1000-0.9 UT/500ML-% IV SOLN
INTRAVENOUS | Status: DC | PRN
  Administered 2020-08-03 (×2): 500 mL

## 2020-08-03 MED ORDER — ASPIRIN 81 MG PO CHEW
81.0000 mg | CHEWABLE_TABLET | ORAL | Status: AC
  Administered 2020-08-04: 81 mg via ORAL
  Filled 2020-08-03: qty 1

## 2020-08-03 MED ORDER — HEPARIN SODIUM (PORCINE) 1000 UNIT/ML IJ SOLN
INTRAMUSCULAR | Status: DC | PRN
  Administered 2020-08-03: 4500 [IU] via INTRAVENOUS

## 2020-08-03 MED ORDER — SODIUM CHLORIDE 0.9 % IV SOLN: 250.0000 mL | INTRAVENOUS | Status: DC | PRN

## 2020-08-03 MED ORDER — DIPHENHYDRAMINE HCL 50 MG/ML IJ SOLN
25.0000 mg | Freq: Once | INTRAMUSCULAR | Status: AC
  Administered 2020-08-04: 25 mg via INTRAVENOUS
  Filled 2020-08-03 (×2): qty 1

## 2020-08-03 MED ORDER — FENTANYL CITRATE (PF) 100 MCG/2ML IJ SOLN
INTRAMUSCULAR | Status: AC
  Filled 2020-08-03: qty 2

## 2020-08-03 MED ORDER — DILTIAZEM HCL ER COATED BEADS 240 MG PO CP24
240.0000 mg | ORAL_CAPSULE | Freq: Every day | ORAL | Status: DC
  Administered 2020-08-04 – 2020-08-05 (×2): 240 mg via ORAL
  Filled 2020-08-03 (×2): qty 1

## 2020-08-03 MED ORDER — HYDRALAZINE HCL 20 MG/ML IJ SOLN
INTRAMUSCULAR | Status: AC
  Filled 2020-08-03: qty 1

## 2020-08-03 MED ORDER — TAMSULOSIN HCL 0.4 MG PO CAPS
0.4000 mg | ORAL_CAPSULE | Freq: Every day | ORAL | Status: DC
  Administered 2020-08-03 – 2020-08-04 (×2): 0.4 mg via ORAL
  Filled 2020-08-03 (×2): qty 1

## 2020-08-03 MED ORDER — ASPIRIN 81 MG PO CHEW: 81.0000 mg | CHEWABLE_TABLET | ORAL | Status: DC

## 2020-08-03 MED ORDER — LIDOCAINE HCL (PF) 1 % IJ SOLN
INTRAMUSCULAR | Status: AC
  Filled 2020-08-03: qty 30

## 2020-08-03 MED ORDER — PREDNISONE 20 MG PO TABS
50.0000 mg | ORAL_TABLET | Freq: Four times a day (QID) | ORAL | Status: AC
  Administered 2020-08-03 – 2020-08-04 (×3): 50 mg via ORAL
  Filled 2020-08-03 (×4): qty 1

## 2020-08-03 MED ORDER — SODIUM CHLORIDE 0.9% FLUSH: 3.0000 mL | Freq: Two times a day (BID) | INTRAVENOUS | Status: DC

## 2020-08-03 MED ORDER — IOHEXOL 350 MG/ML SOLN
INTRAVENOUS | Status: DC | PRN
  Administered 2020-08-03: 35 mL

## 2020-08-03 MED ORDER — SODIUM CHLORIDE 0.9 % WEIGHT BASED INFUSION
3.0000 mL/kg/h | INTRAVENOUS | Status: DC
  Administered 2020-08-03: 3 mL/kg/h via INTRAVENOUS

## 2020-08-03 MED ORDER — HYDRALAZINE HCL 20 MG/ML IJ SOLN
INTRAMUSCULAR | Status: DC | PRN
  Administered 2020-08-03 (×2): 10 mg via INTRAVENOUS

## 2020-08-03 MED ORDER — CLOPIDOGREL BISULFATE 75 MG PO TABS
75.0000 mg | ORAL_TABLET | Freq: Every day | ORAL | Status: DC
  Administered 2020-08-04 – 2020-08-05 (×2): 75 mg via ORAL
  Filled 2020-08-03 (×3): qty 1

## 2020-08-03 MED ORDER — SODIUM CHLORIDE 0.9% FLUSH
3.0000 mL | INTRAVENOUS | Status: DC | PRN
Start: 2020-08-03 — End: 2020-08-03

## 2020-08-03 MED ORDER — LIDOCAINE HCL (PF) 1 % IJ SOLN
INTRAMUSCULAR | Status: DC | PRN
  Administered 2020-08-03: 4 mL

## 2020-08-03 MED ORDER — MIDAZOLAM HCL 2 MG/2ML IJ SOLN
INTRAMUSCULAR | Status: AC
  Filled 2020-08-03: qty 2

## 2020-08-03 MED ORDER — AMLODIPINE BESYLATE 5 MG PO TABS
5.0000 mg | ORAL_TABLET | Freq: Every day | ORAL | Status: AC
  Administered 2020-08-03 – 2020-08-04 (×2): 5 mg via ORAL
  Filled 2020-08-03 (×2): qty 1

## 2020-08-03 MED ORDER — CARVEDILOL 12.5 MG PO TABS
12.5000 mg | ORAL_TABLET | Freq: Two times a day (BID) | ORAL | Status: DC
  Administered 2020-08-03 – 2020-08-05 (×4): 12.5 mg via ORAL
  Filled 2020-08-03 (×4): qty 1

## 2020-08-03 MED ORDER — SODIUM CHLORIDE 0.9 % WEIGHT BASED INFUSION: 1.0000 mL/kg/h | INTRAVENOUS | Status: DC

## 2020-08-03 MED ORDER — VERAPAMIL HCL 2.5 MG/ML IV SOLN
INTRAVENOUS | Status: DC | PRN
  Administered 2020-08-03: 10 mL via INTRA_ARTERIAL

## 2020-08-03 MED ORDER — HEPARIN SODIUM (PORCINE) 1000 UNIT/ML IJ SOLN
INTRAMUSCULAR | Status: AC
  Filled 2020-08-03: qty 1

## 2020-08-03 MED ORDER — SODIUM CHLORIDE 0.9 % IV SOLN: INTRAVENOUS | Status: AC

## 2020-08-03 MED ORDER — FENTANYL CITRATE (PF) 100 MCG/2ML IJ SOLN
INTRAMUSCULAR | Status: DC | PRN
  Administered 2020-08-03: 25 ug via INTRAVENOUS

## 2020-08-03 MED ORDER — SODIUM CHLORIDE 0.9 % WEIGHT BASED INFUSION
1.0000 mL/kg/h | INTRAVENOUS | Status: DC
  Administered 2020-08-03: 1 mL/kg/h via INTRAVENOUS

## 2020-08-03 MED ORDER — ONDANSETRON HCL 4 MG/2ML IJ SOLN: 4.0000 mg | Freq: Four times a day (QID) | INTRAMUSCULAR | Status: DC | PRN

## 2020-08-03 MED ORDER — HEPARIN (PORCINE) IN NACL 1000-0.9 UT/500ML-% IV SOLN
INTRAVENOUS | Status: AC
  Filled 2020-08-03: qty 1000

## 2020-08-03 MED ORDER — CLOPIDOGREL BISULFATE 75 MG PO TABS
300.0000 mg | ORAL_TABLET | Freq: Once | ORAL | Status: AC
  Administered 2020-08-03: 300 mg via ORAL
  Filled 2020-08-03: qty 4

## 2020-08-03 MED ORDER — LIDOCAINE 5 % EX PTCH: 1.0000 | MEDICATED_PATCH | Freq: Every day | CUTANEOUS | Status: DC | PRN

## 2020-08-03 MED ORDER — LABETALOL HCL 5 MG/ML IV SOLN: 10.0000 mg | INTRAVENOUS | Status: AC | PRN

## 2020-08-03 MED ORDER — ROSUVASTATIN CALCIUM 20 MG PO TABS
20.0000 mg | ORAL_TABLET | Freq: Every evening | ORAL | Status: DC
  Administered 2020-08-03 – 2020-08-04 (×2): 20 mg via ORAL
  Filled 2020-08-03 (×2): qty 1

## 2020-08-03 MED ORDER — ACETAMINOPHEN 325 MG PO TABS: 650.0000 mg | ORAL_TABLET | ORAL | Status: DC | PRN

## 2020-08-03 MED ORDER — ASPIRIN 81 MG PO CHEW: 81.0000 mg | CHEWABLE_TABLET | Freq: Once | ORAL | Status: DC

## 2020-08-03 MED ORDER — ASPIRIN 325 MG PO TABS
325.0000 mg | ORAL_TABLET | Freq: Every day | ORAL | Status: DC
  Filled 2020-08-03: qty 1

## 2020-08-03 SURGICAL SUPPLY — 9 items
CATH OPTITORQUE TIG 4.0 5F (CATHETERS) ×1 IMPLANT
DEVICE RAD COMP TR BAND LRG (VASCULAR PRODUCTS) ×1 IMPLANT
GLIDESHEATH SLEND SS 6F .021 (SHEATH) ×1 IMPLANT
GUIDEWIRE INQWIRE 1.5J.035X260 (WIRE) IMPLANT
INQWIRE 1.5J .035X260CM (WIRE) ×2
KIT HEART LEFT (KITS) ×2 IMPLANT
PACK CARDIAC CATHETERIZATION (CUSTOM PROCEDURE TRAY) ×2 IMPLANT
TRANSDUCER W/STOPCOCK (MISCELLANEOUS) ×2 IMPLANT
TUBING CIL FLEX 10 FLL-RA (TUBING) ×2 IMPLANT

## 2020-08-03 NOTE — Progress Notes (Addendum)
Report called to [place] on 6700.  Transported patient on Tele monitor.

## 2020-08-03 NOTE — Interval H&P Note (Signed)
History and Physical Interval Note:  08/03/2020 11:37 AM  [name]  has presented today for surgery, with the diagnosis of abnormal stress test/angina.  The various methods of treatment have been discussed with the patient and family. After consideration of risks, benefits and other options for treatment, the patient has consented to  Procedure(s): LEFT HEART CATH AND CORONARY ANGIOGRAPHY (N/A)  PERCUTANEOUS CORONARY INTERVENTION  as a surgical intervention.  The patient's history has been reviewed, patient examined, no change in status, stable for surgery.  I have reviewed the patient's chart and labs.  Questions were answered to the patient's satisfaction.   . Cath Lab Visit (complete for each Cath Lab visit)  Clinical Evaluation Leading to the Procedure:   ACS: No.  Non-ACS:    Anginal Classification: CCS II  Anti-ischemic medical therapy: Minimal Therapy (1 class of medications)  Non-Invasive Test Results: Equivocal test results -new "fixed "anterior defect with no previous On LAD disease.  Read as "low risk", but with new fixed defect, is at least intermediate.  Prior CABG: No previous CABG   Glenetta Hew

## 2020-08-03 NOTE — Progress Notes (Signed)
Pt took home med's of benadryl and prednisone

## 2020-08-04 ENCOUNTER — Other Ambulatory Visit: Payer: Self-pay

## 2020-08-04 ENCOUNTER — Encounter (HOSPITAL_COMMUNITY)
Admission: RE | Disposition: A | Discharge status: Home / Self Care | Payer: Self-pay | Source: Ambulatory Visit | Attending: Cardiology

## 2020-08-04 ENCOUNTER — Ambulatory Visit (HOSPITAL_COMMUNITY): Admission: RE | Admit: 2020-08-04 | Payer: Medicare Other | Source: Home / Self Care | Admitting: Cardiology

## 2020-08-04 ENCOUNTER — Encounter (HOSPITAL_COMMUNITY): Payer: Self-pay | Admitting: Cardiology

## 2020-08-04 DIAGNOSIS — M1990 Unspecified osteoarthritis, unspecified site: Secondary | ICD-10-CM | POA: Diagnosis present

## 2020-08-04 DIAGNOSIS — G4700 Insomnia, unspecified: Secondary | ICD-10-CM | POA: Diagnosis present

## 2020-08-04 DIAGNOSIS — Z91013 Allergy to seafood: Secondary | ICD-10-CM | POA: Diagnosis not present

## 2020-08-04 DIAGNOSIS — Z79899 Other long term (current) drug therapy: Secondary | ICD-10-CM | POA: Diagnosis not present

## 2020-08-04 DIAGNOSIS — Z955 Presence of coronary angioplasty implant and graft: Secondary | ICD-10-CM | POA: Diagnosis not present

## 2020-08-04 DIAGNOSIS — I129 Hypertensive chronic kidney disease with stage 1 through stage 4 chronic kidney disease, or unspecified chronic kidney disease: Secondary | ICD-10-CM | POA: Diagnosis present

## 2020-08-04 DIAGNOSIS — E785 Hyperlipidemia, unspecified: Secondary | ICD-10-CM | POA: Diagnosis present

## 2020-08-04 DIAGNOSIS — Z9861 Coronary angioplasty status: Secondary | ICD-10-CM | POA: Diagnosis not present

## 2020-08-04 DIAGNOSIS — H9190 Unspecified hearing loss, unspecified ear: Secondary | ICD-10-CM | POA: Diagnosis present

## 2020-08-04 DIAGNOSIS — G4733 Obstructive sleep apnea (adult) (pediatric): Secondary | ICD-10-CM | POA: Diagnosis present

## 2020-08-04 DIAGNOSIS — N184 Chronic kidney disease, stage 4 (severe): Secondary | ICD-10-CM | POA: Diagnosis not present

## 2020-08-04 DIAGNOSIS — N1832 Chronic kidney disease, stage 3b: Secondary | ICD-10-CM | POA: Diagnosis present

## 2020-08-04 DIAGNOSIS — Z8546 Personal history of malignant neoplasm of prostate: Secondary | ICD-10-CM | POA: Diagnosis not present

## 2020-08-04 DIAGNOSIS — R9439 Abnormal result of other cardiovascular function study: Secondary | ICD-10-CM | POA: Diagnosis not present

## 2020-08-04 DIAGNOSIS — E669 Obesity, unspecified: Secondary | ICD-10-CM | POA: Diagnosis present

## 2020-08-04 DIAGNOSIS — I2510 Atherosclerotic heart disease of native coronary artery without angina pectoris: Secondary | ICD-10-CM | POA: Diagnosis not present

## 2020-08-04 DIAGNOSIS — R001 Bradycardia, unspecified: Secondary | ICD-10-CM | POA: Diagnosis present

## 2020-08-04 DIAGNOSIS — Z85820 Personal history of malignant melanoma of skin: Secondary | ICD-10-CM | POA: Diagnosis not present

## 2020-08-04 DIAGNOSIS — F32A Depression, unspecified: Secondary | ICD-10-CM | POA: Diagnosis present

## 2020-08-04 DIAGNOSIS — I25118 Atherosclerotic heart disease of native coronary artery with other forms of angina pectoris: Secondary | ICD-10-CM | POA: Diagnosis not present

## 2020-08-04 DIAGNOSIS — Z91041 Radiographic dye allergy status: Secondary | ICD-10-CM | POA: Diagnosis not present

## 2020-08-04 DIAGNOSIS — E1140 Type 2 diabetes mellitus with diabetic neuropathy, unspecified: Secondary | ICD-10-CM | POA: Diagnosis present

## 2020-08-04 DIAGNOSIS — Z7901 Long term (current) use of anticoagulants: Secondary | ICD-10-CM | POA: Diagnosis not present

## 2020-08-04 DIAGNOSIS — E1122 Type 2 diabetes mellitus with diabetic chronic kidney disease: Secondary | ICD-10-CM | POA: Diagnosis present

## 2020-08-04 DIAGNOSIS — Z7984 Long term (current) use of oral hypoglycemic drugs: Secondary | ICD-10-CM | POA: Diagnosis not present

## 2020-08-04 DIAGNOSIS — M109 Gout, unspecified: Secondary | ICD-10-CM | POA: Diagnosis present

## 2020-08-04 DIAGNOSIS — Z888 Allergy status to other drugs, medicaments and biological substances status: Secondary | ICD-10-CM | POA: Diagnosis not present

## 2020-08-04 DIAGNOSIS — R0789 Other chest pain: Secondary | ICD-10-CM | POA: Diagnosis not present

## 2020-08-04 DIAGNOSIS — I480 Paroxysmal atrial fibrillation: Secondary | ICD-10-CM | POA: Diagnosis not present

## 2020-08-04 HISTORY — PX: CORONARY STENT INTERVENTION: CATH118234

## 2020-08-04 LAB — CBC
HCT: 39 % (ref 39.0–52.0)
Hemoglobin: 12.7 g/dL — ABNORMAL LOW (ref 13.0–17.0)
MCH: 28.9 pg (ref 26.0–34.0)
MCHC: 32.6 g/dL (ref 30.0–36.0)
MCV: 88.6 fL (ref 80.0–100.0)
Platelets: 196 10*3/uL (ref 150–400)
RBC: 4.4 MIL/uL (ref 4.22–5.81)
RDW: 14.3 % (ref 11.5–15.5)
WBC: 14.5 10*3/uL — ABNORMAL HIGH (ref 4.0–10.5)
nRBC: 0 % (ref 0.0–0.2)

## 2020-08-04 LAB — BASIC METABOLIC PANEL
Anion gap: 10 (ref 5–15)
Anion gap: 9 (ref 5–15)
BUN: 37 mg/dL — ABNORMAL HIGH (ref 8–23)
BUN: 42 mg/dL — ABNORMAL HIGH (ref 8–23)
CO2: 22 mmol/L (ref 22–32)
CO2: 19 mmol/L — ABNORMAL LOW (ref 22–32)
Calcium: 8.7 mg/dL — ABNORMAL LOW (ref 8.9–10.3)
Calcium: 8.1 mg/dL — ABNORMAL LOW (ref 8.9–10.3)
Chloride: 106 mmol/L (ref 98–111)
Chloride: 109 mmol/L (ref 98–111)
Creatinine, Ser: 2.02 mg/dL — ABNORMAL HIGH (ref 0.61–1.24)
Creatinine, Ser: 2.03 mg/dL — ABNORMAL HIGH (ref 0.61–1.24)
GFR, Estimated: 32 mL/min — ABNORMAL LOW (ref >60)
GFR, Estimated: 32 mL/min — ABNORMAL LOW (ref >60)
Glucose, Bld: 174 mg/dL — ABNORMAL HIGH (ref 70–99)
Glucose, Bld: 219 mg/dL — ABNORMAL HIGH (ref 70–99)
Potassium: 4.3 mmol/L (ref 3.5–5.1)
Potassium: 3.7 mmol/L (ref 3.5–5.1)
Sodium: 138 mmol/L (ref 135–145)
Sodium: 137 mmol/L (ref 135–145)

## 2020-08-04 LAB — POCT ACTIVATED CLOTTING TIME
Activated Clotting Time: 291 seconds
Activated Clotting Time: 243 seconds
Activated Clotting Time: 255 seconds

## 2020-08-04 LAB — GLUCOSE, CAPILLARY
Glucose-Capillary: 336 mg/dL — ABNORMAL HIGH (ref 70–99)
Glucose-Capillary: 180 mg/dL — ABNORMAL HIGH (ref 70–99)
Glucose-Capillary: 283 mg/dL — ABNORMAL HIGH (ref 70–99)
Glucose-Capillary: 199 mg/dL — ABNORMAL HIGH (ref 70–99)
Glucose-Capillary: 203 mg/dL — ABNORMAL HIGH (ref 70–99)

## 2020-08-04 SURGERY — CORONARY STENT INTERVENTION
Anesthesia: LOCAL

## 2020-08-04 MED ORDER — VERAPAMIL HCL 2.5 MG/ML IV SOLN
INTRAVENOUS | Status: DC | PRN
  Administered 2020-08-04: 10 mL via INTRA_ARTERIAL

## 2020-08-04 MED ORDER — HYDRALAZINE HCL 20 MG/ML IJ SOLN: 10.0000 mg | INTRAMUSCULAR | Status: AC | PRN

## 2020-08-04 MED ORDER — FENTANYL CITRATE (PF) 100 MCG/2ML IJ SOLN
INTRAMUSCULAR | Status: DC | PRN
  Administered 2020-08-04: 25 ug via INTRAVENOUS

## 2020-08-04 MED ORDER — VERAPAMIL HCL 2.5 MG/ML IV SOLN
INTRAVENOUS | Status: AC
  Filled 2020-08-04: qty 2

## 2020-08-04 MED ORDER — HEPARIN (PORCINE) IN NACL 1000-0.9 UT/500ML-% IV SOLN
INTRAVENOUS | Status: AC
  Filled 2020-08-04: qty 1000

## 2020-08-04 MED ORDER — SODIUM CHLORIDE 0.9 % IV SOLN: 250.0000 mL | INTRAVENOUS | Status: DC | PRN

## 2020-08-04 MED ORDER — LIDOCAINE HCL (PF) 1 % IJ SOLN
INTRAMUSCULAR | Status: DC | PRN
  Administered 2020-08-04: 5 mL

## 2020-08-04 MED ORDER — FUROSEMIDE 10 MG/ML IJ SOLN
40.0000 mg | Freq: Once | INTRAMUSCULAR | Status: AC
  Administered 2020-08-04: 40 mg via INTRAVENOUS
  Filled 2020-08-04: qty 4

## 2020-08-04 MED ORDER — SODIUM CHLORIDE 0.9% FLUSH: 3.0000 mL | INTRAVENOUS | Status: DC | PRN

## 2020-08-04 MED ORDER — MIDAZOLAM HCL 2 MG/2ML IJ SOLN
INTRAMUSCULAR | Status: AC
  Filled 2020-08-04: qty 2

## 2020-08-04 MED ORDER — LABETALOL HCL 5 MG/ML IV SOLN: 10.0000 mg | INTRAVENOUS | Status: AC | PRN

## 2020-08-04 MED ORDER — HEPARIN (PORCINE) IN NACL 1000-0.9 UT/500ML-% IV SOLN
INTRAVENOUS | Status: DC | PRN
  Administered 2020-08-04 (×2): 500 mL

## 2020-08-04 MED ORDER — NITROGLYCERIN 1 MG/10 ML FOR IR/CATH LAB
INTRA_ARTERIAL | Status: DC | PRN
  Administered 2020-08-04 (×2): 200 ug

## 2020-08-04 MED ORDER — MIDAZOLAM HCL 2 MG/2ML IJ SOLN
INTRAMUSCULAR | Status: DC | PRN
  Administered 2020-08-04: 1 mg via INTRAVENOUS

## 2020-08-04 MED ORDER — LABETALOL HCL 5 MG/ML IV SOLN
INTRAVENOUS | Status: AC
  Filled 2020-08-04: qty 4

## 2020-08-04 MED ORDER — SODIUM CHLORIDE 0.9 % IV SOLN: INTRAVENOUS | Status: AC

## 2020-08-04 MED ORDER — HEPARIN SODIUM (PORCINE) 1000 UNIT/ML IJ SOLN
INTRAMUSCULAR | Status: AC
  Filled 2020-08-04: qty 1

## 2020-08-04 MED ORDER — FENTANYL CITRATE (PF) 100 MCG/2ML IJ SOLN
INTRAMUSCULAR | Status: AC
  Filled 2020-08-04: qty 2

## 2020-08-04 MED ORDER — LABETALOL HCL 5 MG/ML IV SOLN
INTRAVENOUS | Status: DC | PRN
  Administered 2020-08-04: 10 mg via INTRAVENOUS

## 2020-08-04 MED ORDER — IOHEXOL 350 MG/ML SOLN
INTRAVENOUS | Status: AC
  Filled 2020-08-04: qty 1

## 2020-08-04 MED ORDER — SODIUM CHLORIDE 0.9 % IV SOLN: INTRAVENOUS | Status: DC

## 2020-08-04 MED ORDER — NITROGLYCERIN 1 MG/10 ML FOR IR/CATH LAB
INTRA_ARTERIAL | Status: AC
  Filled 2020-08-04: qty 10

## 2020-08-04 MED ORDER — MORPHINE SULFATE (PF) 2 MG/ML IV SOLN: 1.0000 mg | INTRAVENOUS | Status: DC | PRN

## 2020-08-04 MED ORDER — APIXABAN 2.5 MG PO TABS
2.5000 mg | ORAL_TABLET | Freq: Two times a day (BID) | ORAL | Status: DC
  Administered 2020-08-04 – 2020-08-05 (×2): 2.5 mg via ORAL
  Filled 2020-08-04 (×2): qty 1

## 2020-08-04 MED ORDER — ASPIRIN EC 81 MG PO TBEC: 81.0000 mg | DELAYED_RELEASE_TABLET | Freq: Every day | ORAL | Status: DC

## 2020-08-04 MED ORDER — APIXABAN 5 MG PO TABS: 5.0000 mg | ORAL_TABLET | Freq: Two times a day (BID) | ORAL | Status: DC

## 2020-08-04 MED ORDER — LIDOCAINE HCL (PF) 1 % IJ SOLN
INTRAMUSCULAR | Status: AC
  Filled 2020-08-04: qty 30

## 2020-08-04 MED ORDER — SODIUM CHLORIDE 0.9% FLUSH: 3.0000 mL | Freq: Two times a day (BID) | INTRAVENOUS | Status: DC

## 2020-08-04 MED ORDER — HEPARIN SODIUM (PORCINE) 1000 UNIT/ML IJ SOLN
INTRAMUSCULAR | Status: DC | PRN
  Administered 2020-08-04: 2000 [IU] via INTRAVENOUS
  Administered 2020-08-04: 8500 [IU] via INTRAVENOUS

## 2020-08-04 MED ORDER — IOHEXOL 350 MG/ML SOLN
INTRAVENOUS | Status: DC | PRN
  Administered 2020-08-04: 110 mL

## 2020-08-04 SURGICAL SUPPLY — 25 items
BALL SAPPHIRE NC24 3.0X22 (BALLOONS) ×2
BALLN SAPPHIRE 2.5X15 (BALLOONS) ×2
BALLN SAPPHIRE ~~LOC~~ 3.0X15 (BALLOONS) ×1 IMPLANT
BALLOON SAPPHIRE 2.5X15 (BALLOONS) IMPLANT
BALLOON SAPPHIRE NC24 3.0X22 (BALLOONS) IMPLANT
CATH VISTA GUIDE 6FR XB3.5 (CATHETERS) ×1 IMPLANT
CATH VISTA GUIDE 6FR XBLAD3.5 (CATHETERS) ×1 IMPLANT
DEVICE RAD COMP TR BAND LRG (VASCULAR PRODUCTS) ×1 IMPLANT
ELECT DEFIB PAD ADLT CADENCE (PAD) ×1 IMPLANT
GLIDESHEATH SLEND SS 6F .021 (SHEATH) ×1 IMPLANT
GUIDEWIRE INQWIRE 1.5J.035X260 (WIRE) IMPLANT
INQWIRE 1.5J .035X260CM (WIRE) ×2
KIT ENCORE 26 ADVANTAGE (KITS) ×1 IMPLANT
KIT ESSENTIALS PG (KITS) ×1 IMPLANT
KIT HEART LEFT (KITS) ×2 IMPLANT
PACK CARDIAC CATHETERIZATION (CUSTOM PROCEDURE TRAY) ×2 IMPLANT
SHEATH PROBE COVER 6X72 (BAG) ×1 IMPLANT
STENT SYNERGY XD 2.50X20 (Permanent Stent) IMPLANT
STENT SYNERGY XD 2.75X38 (Permanent Stent) IMPLANT
SYNERGY XD 2.50X20 (Permanent Stent) ×2 IMPLANT
SYNERGY XD 2.75X38 (Permanent Stent) ×2 IMPLANT
TRANSDUCER W/STOPCOCK (MISCELLANEOUS) ×2 IMPLANT
TUBING CIL FLEX 10 FLL-RA (TUBING) ×2 IMPLANT
WIRE ASAHI PROWATER 180CM (WIRE) ×1 IMPLANT
WIRE RUNTHROUGH .014X180CM (WIRE) ×1 IMPLANT

## 2020-08-04 NOTE — Interval H&P Note (Signed)
History and Physical Interval Note:  08/04/2020 7:31 AM  [name]  has presented today for surgery, with the diagnosis of cad.  The various methods of treatment have been discussed with the patient and family. After consideration of risks, benefits and other options for treatment, the patient has consented to  Procedure(s): CORONARY STENT INTERVENTION (N/A) as a surgical intervention.  The patient's history has been reviewed, patient examined, no change in status, stable for surgery.  I have reviewed the patient's chart and labs.  Questions were answered to the patient's satisfaction.     Glenetta Hew

## 2020-08-04 NOTE — Progress Notes (Signed)
ANTICOAGULATION CONSULT NOTE - Initial Consult  Pharmacy Consult for Apixaban  Indication: atrial fibrillation  Allergies  Allergen Reactions  . Betadine [Povidone Iodine] Anaphylaxis  . Contrast Media [Iodinated Diagnostic Agents] Anaphylaxis and Rash    Other Reaction: Other reaction  . Iodine Anaphylaxis    Shellfish, IVP dye. Swelling in throat and foaming at mouth.  . Shellfish Allergy Anaphylaxis    Patient Measurements: Height: 5\' 9"  (175.3 cm) Weight: 87.1 kg (192 lb) IBW/kg (Calculated) : 70.7   Vital Signs: Temp: 97.4 F (36.3 C) (05/03 0937) Temp Source: Oral (05/03 0937) BP: 169/82 (05/03 0937) Pulse Rate: 75 (05/03 0344)  Labs: Recent Labs    08/04/20 0242  HGB 12.7*  HCT 39.0  PLT 196  CREATININE 2.02*    Estimated Creatinine Clearance: 30.8 mL/min (A) (by C-G formula based on SCr of 2.02 mg/dL (H)).   Medical History: Past Medical History:  Diagnosis Date  . Arthritis   . CAD S/P percutaneous coronary angioplasty 08/2004   CARDIOLOGIST-  DR DAVID HARDING; DES to LCx. X2  - Cypher 2.5 mm postdilated to 2.75 mm (20 mm and 13 mm stents);; Myoview 09/2014: LOW RISK. No Infarct or Ischemia.  . Colon polyps 05/10/2005   Hyperplastic polyps  . Complication of anesthesia    "takes long time to wake up"  . Depression   . Diabetes mellitus type 2 with complications (HCC)    neuropathy; CAD borderline- no med  . Diastolic dysfunction without heart failure    Moderate LVH, Gr 1 DD on Echo 2011; no CHF admissions, no tt on diuretisc  . Essential hypertension   . H/O: gout STABLE  . History of melanoma excision 2010   FOREHEAD  . History of prostate cancer 2004--  S/P SEED IMPLANTS    NO RECURRENCE  . Hydrocele, left   . Hyperlipidemia LDL goal <70   . Impaired hearing RIGHT HEARING AID  . OSA on CPAP    bipap  . PAF (paroxysmal atrial fibrillation) ([place]) 12/11/2017   Admitted with Afib RVR - converted on IV Diltiazem. d/c on Eliquis; CHA2DS2Vasc =  5.  . Prostate cancer ([place])    prostate , melanoma head  . Shingles 07  . Syncope and collapse March 2017   Likely related to post micturition, Neurontin and dehydration with orthostatic hypotension  . Urgency of urination       Assessment: 84yom admitted for cath > s/p DES x2 loaded with clopidogrel 300mg  x1 then 75mg  daily.   Hx afib on apixaban 5mg  BID PTA Age > 80 and Cr > 1.5 - last 2 lab draws and variable 1.2-1.7 in years past (no recent outpatient labs prior to this admission)  Given increased Cr and addition of antiplatelet will drop apixaban dose 2.5mg  BID  Goal of Therapy:  Monitor platelets by anticoagulation protocol: Yes   Plan:  Decrease Apixaban 2.5mg  BID Monitor CBC and Bmet If Cr decreases < 1.5 consistently as outpatient - could increase Apixaban 5mg  BID   Bonnita Nasuti Pharm.D. CPP, BCPS Clinical Pharmacist [phone] 08/04/2020 12:03 PM

## 2020-08-04 NOTE — Progress Notes (Signed)
Discussed stents, Plavix, restrictions, diet, exercise, NTG and CRPII with pt and wife. Receptive. He really needs CRPII to increase exercise and requests referral be sent to [facility] (wife just finished pulmonary). He prefers to stay the night and increase his activity/walking later tonight or in the am. [id number] Yves Dill CES, ACSM 11:51 AM 08/04/2020

## 2020-08-04 NOTE — Evaluation (Signed)
Physical Therapy Evaluation Patient Details Name: [name] MRN: [id number] DOB: Aug 17, 1935 Today's Date: 08/04/2020   History of Present Illness  Pt is an 85 y.o. male admitted 08/03/20 for L heart cath with PCI. PMH includes PAF, HTN, DM2, CAD, OSA on CPAP, HOH, syncope, arthritis, depression.    Clinical Impression  Pt presents with an overall decrease in functional mobility secondary to above. PTA, pt mod indep with walking stick, lives with supportive wife. Today, pt mobilizing fairly well with walking stick and min guar d to minA for mobility; pt's stability improving with distance. Pt requires frequent cues to minimize WB through RUE. Pt would benefit from continued acute PT services to maximize functional mobility and independence prior to d/c home.   SpO2 100% on RA HR 60s Post-ambulation BP 151/76    Follow Up Recommendations No PT follow up;Supervision - Intermittent    Equipment Recommendations  None recommended by PT    Recommendations for Other Services       Precautions / Restrictions Precautions Precautions: Fall;Other (comment) Precaution Comments: R wrist TR band Restrictions Other Position/Activity Restrictions: R wrist WB restrictions post-cath      Mobility  Bed Mobility Overal bed mobility: Needs Assistance Bed Mobility: Supine to Sit     Supine to sit: Min assist     General bed mobility comments: Verbal/tactile cues to keep pt from using RUE as he was WB despite max education, minA for trunk elevation    Transfers Overall transfer level: Needs assistance Equipment used: Straight cane Transfers: Sit to/from Stand Sit to Stand: Min guard            Ambulation/Gait Ambulation/Gait assistance: Min guard Gait Distance (Feet): 80 Feet Assistive device: Straight cane Gait Pattern/deviations: Shuffle;Step-through pattern;Decreased stride length;Trunk flexed Gait velocity: Decreased   General Gait Details: Initial slow, shuffling gait  with SPC and min guard, step length and stability improving with distance and verbal cues. Pt frequently stopping to perform other tasks, using SPC to do other things (i.e. pull open curtain, straighten bed pad)  Stairs            Wheelchair Mobility    Modified Rankin (Stroke Patients Only)       Balance Overall balance assessment: Needs assistance   Sitting balance-Leahy Scale: Good       Standing balance-Leahy Scale: Fair Standing balance comment: Can static stand and ambulate without UE support                             Pertinent Vitals/Pain Pain Assessment: No/denies pain    Home Living Family/patient expects to be discharged to:: Private residence Living Arrangements: Spouse/significant other Available Help at Discharge: Family;Available 24 hours/day Type of Home: House Home Access: Stairs to enter   [employer] of Steps: 3 Home Layout: One level Home Equipment: Walker - 2 wheels;Shower seat;Grab bars - tub/shower;Grab bars - toilet;Cane - single point      Prior Function Level of Independence: Independent         Comments: Drives, mod indep with SPC     Hand Dominance   Dominant Hand: Left    Extremity/Trunk Assessment   Upper Extremity Assessment Upper Extremity Assessment: Overall WFL for tasks assessed (RUE not tested secondary to TR band)    Lower Extremity Assessment Lower Extremity Assessment: Overall WFL for tasks assessed       Communication   Communication: No difficulties  Cognition Arousal/Alertness: Awake/alert Behavior  During Therapy: WFL for tasks assessed/performed Overall Cognitive Status: Within Functional Limits for tasks assessed                                 General Comments: WFL for simple tasks. Making jokes the entire session requiring frequent redirection to task; wife reports this is baseline      General Comments General comments (skin integrity, edema, etc.): Pt's  wife present and supportive. SpO2 100% on RA, HR 60s, post-ambulation BP 151/76    Exercises     Assessment/Plan    PT Assessment Patient needs continued PT services  PT Problem List Decreased activity tolerance;Decreased balance;Decreased mobility;Cardiopulmonary status limiting activity       PT Treatment Interventions DME instruction;Gait training;Stair training;Functional mobility training;Therapeutic activities;Therapeutic exercise;Balance training;Patient/family education    PT Goals (Current goals can be found in the Care Plan section)  Acute Rehab PT Goals Patient Stated Goal: home tomorrow PT Goal Formulation: With patient/family Time For Goal Achievement: 08/18/20 Potential to Achieve Goals: Good    Frequency Min 3X/week   Barriers to discharge        Co-evaluation               AM-PAC PT "6 Clicks" Mobility  Outcome Measure Help needed turning from your back to your side while in a flat bed without using bedrails?: None Help needed moving from lying on your back to sitting on the side of a flat bed without using bedrails?: A Little Help needed moving to and from a bed to a chair (including a wheelchair)?: A Little Help needed standing up from a chair using your arms (e.g., wheelchair or bedside chair)?: A Little Help needed to walk in hospital room?: A Little Help needed climbing 3-5 steps with a railing? : A Little 6 Click Score: 19    End of Session Equipment Utilized During Treatment: Gait belt Activity Tolerance: Patient tolerated treatment well Patient left: in bed;with call bell/phone within reach;with bed alarm set;with family/visitor present;with nursing/sitter in room Nurse Communication: Mobility status PT Visit Diagnosis: Other abnormalities of gait and mobility (R26.89)    Time: 7510-2585 PT Time Calculation (min) (ACUTE ONLY): 18 min   Charges:   PT Evaluation $PT Eval Low Complexity: [place], PT, DPT Acute  Rehabilitation Services  Pager [phone] Office [place] 08/04/2020, 2:16 PM

## 2020-08-04 NOTE — Progress Notes (Signed)
TR BAND REMOVAL  LOCATION:  right radial  DEFLATED PER PROTOCOL:  Yes.    TIME BAND OFF / DRESSING APPLIED:   1500   SITE UPON ARRIVAL:   Level 0  SITE AFTER BAND REMOVAL:  Level 0  CIRCULATION SENSATION AND MOVEMENT:  Within Normal Limits  Yes.    COMMENTS:    

## 2020-08-05 ENCOUNTER — Other Ambulatory Visit: Payer: Self-pay | Admitting: Home Health

## 2020-08-05 ENCOUNTER — Other Ambulatory Visit (HOSPITAL_COMMUNITY): Payer: Self-pay

## 2020-08-05 ENCOUNTER — Other Ambulatory Visit: Payer: Self-pay

## 2020-08-05 DIAGNOSIS — R9439 Abnormal result of other cardiovascular function study: Secondary | ICD-10-CM

## 2020-08-05 DIAGNOSIS — I2510 Atherosclerotic heart disease of native coronary artery without angina pectoris: Secondary | ICD-10-CM

## 2020-08-05 DIAGNOSIS — Z9861 Coronary angioplasty status: Secondary | ICD-10-CM

## 2020-08-05 DIAGNOSIS — I480 Paroxysmal atrial fibrillation: Secondary | ICD-10-CM

## 2020-08-05 DIAGNOSIS — Z79899 Other long term (current) drug therapy: Secondary | ICD-10-CM

## 2020-08-05 DIAGNOSIS — N184 Chronic kidney disease, stage 4 (severe): Secondary | ICD-10-CM

## 2020-08-05 LAB — LIPID PANEL
Cholesterol: 106 mg/dL (ref 0–200)
HDL: 38 mg/dL — ABNORMAL LOW (ref >40)
LDL Cholesterol: 44 mg/dL (ref 0–99)
Total CHOL/HDL Ratio: 2.8 RATIO
Triglycerides: 121 mg/dL (ref <150)
VLDL: 24 mg/dL (ref 0–40)

## 2020-08-05 LAB — GLUCOSE, CAPILLARY
Glucose-Capillary: 146 mg/dL — ABNORMAL HIGH (ref 70–99)
Glucose-Capillary: 164 mg/dL — ABNORMAL HIGH (ref 70–99)

## 2020-08-05 LAB — CBC
HCT: 34.9 % — ABNORMAL LOW (ref 39.0–52.0)
Hemoglobin: 11.5 g/dL — ABNORMAL LOW (ref 13.0–17.0)
MCH: 29 pg (ref 26.0–34.0)
MCHC: 33 g/dL (ref 30.0–36.0)
MCV: 88.1 fL (ref 80.0–100.0)
Platelets: 180 10*3/uL (ref 150–400)
RBC: 3.96 MIL/uL — ABNORMAL LOW (ref 4.22–5.81)
RDW: 14.6 % (ref 11.5–15.5)
WBC: 14.7 10*3/uL — ABNORMAL HIGH (ref 4.0–10.5)
nRBC: 0 % (ref 0.0–0.2)

## 2020-08-05 LAB — BASIC METABOLIC PANEL
Anion gap: 9 (ref 5–15)
BUN: 43 mg/dL — ABNORMAL HIGH (ref 8–23)
CO2: 22 mmol/L (ref 22–32)
Calcium: 8 mg/dL — ABNORMAL LOW (ref 8.9–10.3)
Chloride: 108 mmol/L (ref 98–111)
Creatinine, Ser: 2.04 mg/dL — ABNORMAL HIGH (ref 0.61–1.24)
GFR, Estimated: 32 mL/min — ABNORMAL LOW (ref >60)
Glucose, Bld: 149 mg/dL — ABNORMAL HIGH (ref 70–99)
Potassium: 3.6 mmol/L (ref 3.5–5.1)
Sodium: 139 mmol/L (ref 135–145)

## 2020-08-05 MED ORDER — APIXABAN 2.5 MG PO TABS
2.5000 mg | ORAL_TABLET | Freq: Two times a day (BID) | ORAL | 2 refills | Status: AC
  Filled 2020-08-05: qty 60, 30d supply, fill #0

## 2020-08-05 MED ORDER — NITROGLYCERIN 0.4 MG SL SUBL
0.4000 mg | SUBLINGUAL_TABLET | SUBLINGUAL | 2 refills | Status: DC | PRN
  Filled 2020-08-05: qty 25, 7d supply, fill #0

## 2020-08-05 MED ORDER — CLOPIDOGREL BISULFATE 75 MG PO TABS
75.0000 mg | ORAL_TABLET | Freq: Every day | ORAL | 90 refills | Status: DC
  Filled 2020-08-05: qty 90, 90d supply, fill #0

## 2020-08-05 NOTE — Discharge Summary (Addendum)
The patient has been seen in conjunction with Farrel Demark, NP. All aspects of care have been considered and discussed. The patient has been personally interviewed, examined, and all clinical data has been reviewed.  Right radial cath site is unremarkable.  Creatinine is stable at 2.02.  Will resume all outpatient medication and discharged today.  Basic metabolic panel in 5 days.  Noted to have moderate bradycardia.  We will leave to Dr. Ellyn Hack whether beta-blocker/diltiazem combination needs adjustment.  Discharged today.    Discharge Summary    Patient ID: [name] MRN: [id number]; DOB: 07-15-1935  Admit date: 08/03/2020 Discharge date: 08/05/2020  PCP:  Lavone Orn, MD   [facility] Providers Cardiologist:  Glenetta Hew, MD   {   Discharge Diagnoses    Principal Problem:   Abnormal nuclear stress test Active Problems:   CAD S/P percutaneous coronary angioplasty: Cypher DES x2 to circumflex   Diastolic dysfunction without heart failure   Chest pain of uncertain etiology   PAF (paroxysmal atrial fibrillation) ; CHA2DS2-VASc Score =5 (Eliquis)   Coronary artery disease involving native coronary artery of native heart with angina pectoris [facility])    Diagnostic Studies/Procedures    Coronary stent intervention 08/04/20:   Prox Cx lesion is 65% stenosed. Prox Cx to Mid Cx lesion is 30% stenosed. Mid Cx lesion is 75% stenosed. Non-stenotic Mid Cx to Dist Cx stent was previously treated.  A drug-eluting stent was successfully placed (covering all 3 lesions and crossing OM1, overlapping previous stent) using a SYNERGY XD 2.75X38. Deployed to 3.0 mm.  Post intervention, there is a 5% residual stenosis in the AV groove ostium. Otherwise 0% residual stenosis throughout including at the overlap site.  --------------------------  Prox LAD lesion is 40% stenosed.  Mid LAD lesion is 75% stenosed.  A drug-eluting stent was successfully placed using a SYNERGY XD  2.50X20. Postdilated to 3.1 mm.  Post intervention, there is a 0% residual stenosis.   SUMMARY  Successful 2-vessel PCI:  Mid to distal LCx lesions treated with a single Synergy DES 2.7 mm x 38 mm postdilated to 3.0 mm.  (Overlaps previous stent, and crosses OM1 proximally)  Mid LAD lesion treated with Synergy DES 2.68mm x 20 mm (postdilated tapered from 3.1 to 2.8 mm)  Moderately elevated LVEDP of 19 mmHg.  RECOMMENDATIONS  8 HR post-cath hydration - but will give 1 dose Lasix 40 mg x 1 given LVEDP 19 mmHg ? Recheck BMP ~1600 - consider potential d/c in PM  F/u with me when he returns from his trip.    Left heart cath 08/03/20:   LV end diastolic pressure is mildly elevated, in the setting of systemic hypertension  ---------------------------  Prox RCA to Dist RCA lesion is 99% stenosed with 90% stenosed side branch in RV Branch. Dist RCA lesion is 100% stenosed.  Prox LAD lesion is 40% stenosed.  Mid LAD lesion is 75% stenosed.  Prox Cx lesion is 65% stenosed.  Prox Cx to Mid Cx lesion is 30% stenosed.  Mid Cx lesion is 75% stenosed.  Previously placed Mid Cx to Dist Cx stent (Cypher DES) is widely patent.   SUMMARY  Severe multivessel CAD: --> Proximal LAD 40% with severely diseased D1 followed by a large septal perforator (SP1 provides collaterals to RPDA), just after SP1 there is eccentric 75% stenosis in the LAD.  Diffuse downstream disease in the LAD of 20 to 30% with a wraparound LAD providing collaterals to RPL branches. --> Focal 65 %  LCx stenosis just after takeoff of OM1 followed by tapering 30% stenosis down to 75% just at the takeoff of the AV groove circumflex.  The follow-on circumflex has a widely patent stent and gives rise to a large posterolateral OM 3 branch --> 99% proximal RCA with trace flow to the distal proximal where it is not 100% occluded.  Fills via collaterals from AVG LCx, SP1 and distal LAD  Systemic hypertension with moderately  elevated LVEDP   RECOMMENDATIONS  Technically, he has operative disease, but is not a good surgical candidate with his significant back issues.  He would not likely recover well.  He is also very very fearful of major surgeries.  As such, I think PCI is the best option.  With renal insufficiency, we will admit under observation status tonight to allow for early morning hydration and first case two-vessel PCI of the LAD and LCx.  He will need to be repremedicated with prednisone.  We will hydrate him today, cover antihypertension with IV labetalol and hydralazine along with oral amlodipine while we are holding ARB and chlorthalidone. ? Plan will be to restart ARB and chlorthalidone 1 day post PCI.  Loaded with clopidogrel 300 mg tonight and 75 mg daily starting tomorrow.    We will give 325 mg of aspirin tomorrow morning, but would not discharge him on aspirin he will be on clopidogrel plus apixaban (which will be restarted tomorrow)  Continue statin.   Echo from 12/22/2017:  - Left ventricle: The cavity size was normal. There was moderate  concentric hypertrophy. Systolic function was normal. The  estimated ejection fraction was in the range of 55% to 60%. Wall  motion was normal; there were no regional wall motion  abnormalities. Indeterminate mean left atrial filling pressure.  - Left atrium: The atrium was mildly to moderately dilated.  _____________   History of Present Illness     [name] is a 85 y.o. male with PMH of CAD s/p remote to PCI to LCx, paroxysmal A fib on Eliquis, HTN, HLD, OSA on CPAP, type 2 diabetes, disability due to spinal tumor s/p resection, CKD.  Patient was evaluated by Dr Ellyn Hack in the office on 07/30/20 for abnormal Myoview stress test showing a new sizeable anterior perfusion defect along with multiple episodes of chest pain over the past few months.   He underwent elective left heart cath on 08/03/20 showed severe multivessel CAD,  Proximal LAD 40% with severely diseased D1 followed by a large septal perforator (SP1 provides collaterals to RPDA), just after SP1 there is eccentric 75% stenosis in the LAD.  Diffuse downstream disease in the LAD of 20 to 30% with a wraparound LAD providing collaterals to RPL branches. Focal 65 % LCx stenosis just after takeoff of OM1 followed by tapering 30% stenosis down to 75% just at the takeoff of the AV groove circumflex.  The follow-on circumflex has a widely patent stent and gives rise to a large posterolateral OM 3 branch. 99% proximal RCA with trace flow to the distal proximal where it is not 100% occluded.  Fills via collaterals from AVG LCx, SP1 and distal LAD. Systemic hypertension with moderately elevated LVEDP 85mmHg. He was felt not a good surgical candidate and PCI is the best option. Due to renal insufficiency, he was admitted overnight for observation, given  gentle hydration before staged two-vessel PCI of LAD and left circumflex.  His ARB and chlorthalidone were held, treated with labetalol/hydralazine/amlodipine for hypertension before PCI.  He was loaded  with Plavix.     Hospital Course     Consultants: N/A  Chest pain CAD with hx of remote PCI to LCx -Patient had several months of onset of chest pain with atypical character, Myoview stress test with concern of anterior perfusion defect, therefore was planned for left cardiac catheterization on 08/03/2020 - s/p Synergy DES to mid to distal left Cx lesion, overlap previous stent and Synergy DES to mid LAD on 08/04/2020 by Dr. Ellyn Hack, received 40 mg Lasix x1 due to LVEDP 82mmHg -He was given pre and post cast gentle hydration, renal index remains stable -He denies any chest pain, feels well today - will discharge with medical therapy with Plavix 75 mg daily (new), no aspirin (on Eliquis), carvedilol and statin - arranged for follow-up with cardiology on 08/14/20 with APP (Dr Ellyn Hack has no early appt) - will need to repeat BMP 5/10   at [facility] office, script given, follow up result at office   Paroxysmal A fib Nocturia sinus bradycardia  - currently remains in sinus rhythm, nocturia sinus bradycardia with heart rate down to 50s noted  - heart rate is 60s while awake, no symptoms  - discussed with Dr Tamala Julian, recommend defer to primary cardiologist on whether to down titrate diltiazem and carvedilol at follow up visit, will resume home dose for now  - CHA2DS2VASc score is 5 due to age, HTN, DM, CAD, will continue anticoagulation with Eliquis, he was on 5 mg twice daily at home, currently meeting criteria for low-dose Eliquis due to >80y and Cr >1.5, will discharge on 2.5 mg twice daily, new script given   CKD IIIb , presumed  - sCr 1.27 on 12/11/2017, 1.96 on 07/30/20, 2.02 pre-cath and 2.04 post-cath - no recent labs to compare trend, suspect CKD stage IIIb - non-oliguria - he is on chlorthalidone, telmisartan, metformin at home  - discussed with pharmacy, OK to resume above regimen tomorrow, will repeat BMP in 1 week on 5/10, if renal function deteriorating, may consider alternative regimen for HTN and DM long term - he will need routine monitor for proteinuria  - discussed with the patient to avoid NSAIDs such as ibuprofen, motrin, advil   HTN - BP elevated, possible due to IVF hydration for cath as well as holding home regimen including chlorthalidone and telmisartan - will discharge on home regimen carvedilol 12.5 mg twice daily, chlorthalidone 12.5 mg daily, telmisartan 40 mg daily, and diltiazem 240 mg daily  -Patient to monitor BP daily at home, may need up titration of antihypertensive regimen if remains elevated   HLD -No recent lipid profile, continue Crestor 20 mg daily, will update lipid profile, follow up result in the office     Did the patient have an acute coronary syndrome (MI, NSTEMI, STEMI, etc) this admission?:  No                               Did the patient have a percutaneous coronary  intervention (stent / angioplasty)?:  Yes.     Cath/PCI Registry Performance & Quality Measures: 5. Aspirin prescribed? - No - patient is on Plavix and Eliquis  6. ADP Receptor Inhibitor (Plavix/Clopidogrel, Brilinta/Ticagrelor or Effient/Prasugrel) prescribed (includes medically managed patients)? - Yes 7. High Intensity Statin (Lipitor 40-80mg  or Crestor 20-40mg ) prescribed? - Yes 8. For EF <40%, was ACEI/ARB prescribed? - Yes 9. For EF <40%, Aldosterone Antagonist (Spironolactone or Eplerenone) prescribed? - Not Applicable (EF >/= 37%) 10.  Cardiac Rehab Phase II ordered? - Yes       _____________  Discharge Vitals Blood pressure (!) 151/64, pulse (!) 59, temperature (!) 97.3 F (36.3 C), temperature source Oral, resp. rate 16, height 5\' 9"  (1.753 m), weight 91.4 kg, SpO2 96 %.  Filed Weights   08/03/20 0618 08/05/20 0505  Weight: 87.1 kg 91.4 kg    Vitals:  Vitals:   08/05/20 0505 08/05/20 0907  BP: (!) 158/63 (!) 151/64  Pulse:    Resp: 16   Temp: (!) 97 F (36.1 C) (!) 97.3 F (36.3 C)  SpO2:  96%    General Appearance: In no apparent distress, laying in bed, eating breakfast  HEENT: Normocephalic, atraumatic. EOMs intact. Neck: Supple, trachea midline, no JVDs  Cardiovascular: Regular rate and rhythm, normal S1-S2,  no murmur/rub/gallop Respiratory: Resting breathing unlabored, lungs sounds clear to auscultation bilaterally, no use of accessory muscles. On room air.  No wheezes, rales or rhonchi.   Gastrointestinal: Bowel sounds positive, abdomen soft, non-tender, non-distended.  Extremities: Able to move all extremities in bed without difficulty, no edema/cyanosis/clubbing Genitourinary: genital exam not performed Musculoskeletal: Normal muscle bulk and tone, no limited range of motion, no swollen or erythematous joints Skin: Intact, warm, dry. No rashes or petechiae noted in exposed areas.  Neurologic: Alert, oriented to person, place and time. Fluent speech,  no  cognitive deficit,  no gross focal neuro deficit Psychiatric: Normal affect. Mood is appropriate.  Right wrist radial artery site with dressing, small area of bruise noted, no significant swelling/bleeding/tenderness noted    Labs & Radiologic Studies    CBC Recent Labs    08/04/20 0242 08/05/20 0231  WBC 14.5* 14.7*  HGB 12.7* 11.5*  HCT 39.0 34.9*  MCV 88.6 88.1  PLT 196 102   Basic Metabolic Panel Recent Labs    08/04/20 1626 08/05/20 0231  NA 137 139  K 3.7 3.6  CL 109 108  CO2 19* 22  GLUCOSE 219* 149*  BUN 42* 43*  CREATININE 2.03* 2.04*  CALCIUM 8.1* 8.0*   Liver Function Tests No results for input(s): AST, ALT, ALKPHOS, BILITOT, PROT, ALBUMIN in the last 72 hours. No results for input(s): LIPASE, AMYLASE in the last 72 hours. High Sensitivity Troponin:   No results for input(s): TROPONINIHS in the last 720 hours.  BNP Invalid input(s): POCBNP D-Dimer No results for input(s): DDIMER in the last 72 hours. Hemoglobin A1C Recent Labs    08/03/20 1657  HGBA1C 6.2*   Fasting Lipid Panel Recent Labs    08/05/20 0949  CHOL 106  HDL 38*  LDLCALC 44  TRIG 121  CHOLHDL 2.8   Thyroid Function Tests No results for input(s): TSH, T4TOTAL, T3FREE, THYROIDAB in the last 72 hours.  Invalid input(s): FREET3 _____________  CARDIAC CATHETERIZATION  Result Date: 08/04/2020  Prox Cx lesion is 65% stenosed. Prox Cx to Mid Cx lesion is 30% stenosed. Mid Cx lesion is 75% stenosed. Non-stenotic Mid Cx to Dist Cx stent was previously treated.  A drug-eluting stent was successfully placed (covering all 3 lesions and crossing OM1, overlapping previous stent) using a SYNERGY XD 2.75X38. Deployed to 3.0 mm.  Post intervention, there is a 5% residual stenosis in the AV groove ostium. Otherwise 0% residual stenosis throughout including at the overlap site.  --------------------------  Prox LAD lesion is 40% stenosed.  Mid LAD lesion is 75% stenosed.  A drug-eluting stent  was successfully placed using a SYNERGY XD 2.50X20. Postdilated to 3.1 mm.  Post  intervention, there is a 0% residual stenosis.  SUMMARY  Successful 2-vessel PCI:  Mid to distal LCx lesions treated with a single Synergy DES 2.7 mm x 38 mm postdilated to 3.0 mm.  (Overlaps previous stent, and crosses OM1 proximally)  Mid LAD lesion treated with Synergy DES 2.47mm x 20 mm (postdilated tapered from 3.1 to 2.8 mm)  Moderately elevated LVEDP of 19 mmHg. RECOMMENDATIONS  8 HR post-cath hydration - but will give 1 dose Lasix 40 mg x 1 given LVEDP 19 mmHg  Recheck BMP ~1600 - consider potential d/c in PM F/u with me when he returns from his trip. Glenetta Hew, MD  CARDIAC CATHETERIZATION  Result Date: 09/04/1306  LV end diastolic pressure is mildly elevated, in the setting of systemic hypertension  ---------------------------  Prox RCA to Dist RCA lesion is 99% stenosed with 90% stenosed side branch in RV Branch. Dist RCA lesion is 100% stenosed.  Prox LAD lesion is 40% stenosed.  Mid LAD lesion is 75% stenosed.  Prox Cx lesion is 65% stenosed.  Prox Cx to Mid Cx lesion is 30% stenosed.  Mid Cx lesion is 75% stenosed.  Previously placed Mid Cx to Dist Cx stent (Cypher DES) is widely patent.  SUMMARY  Severe multivessel CAD: --> Proximal LAD 40% with severely diseased D1 followed by a large septal perforator (SP1 provides collaterals to RPDA), just after SP1 there is eccentric 75% stenosis in the LAD.  Diffuse downstream disease in the LAD of 20 to 30% with a wraparound LAD providing collaterals to RPL branches. --> Focal 65 % LCx stenosis just after takeoff of OM1 followed by tapering 30% stenosis down to 75% just at the takeoff of the AV groove circumflex.  The follow-on circumflex has a widely patent stent and gives rise to a large posterolateral OM 3 branch --> 99% proximal RCA with trace flow to the distal proximal where it is not 100% occluded.  Fills via collaterals from AVG LCx, SP1 and distal LAD   Systemic hypertension with moderately elevated LVEDP RECOMMENDATIONS  Technically, he has operative disease, but is not a good surgical candidate with his significant back issues.  He would not likely recover well.  He is also very very fearful of major surgeries.  As such, I think PCI is the best option.  With renal insufficiency, we will admit under observation status tonight to allow for early morning hydration and first case two-vessel PCI of the LAD and LCx.  He will need to be repremedicated with prednisone.  We will hydrate him today, cover antihypertension with IV labetalol and hydralazine along with oral amlodipine while we are holding ARB and chlorthalidone.  Plan will be to restart ARB and chlorthalidone 1 day post PCI.  Loaded with clopidogrel 300 mg tonight and 75 mg daily starting tomorrow.   We will give 325 mg of aspirin tomorrow morning, but would not discharge him on aspirin he will be on clopidogrel plus apixaban (which will be restarted tomorrow)  Continue statin. Glenetta Hew, MD   US RENAL  Result Date: 07/14/2020 CLINICAL DATA:  Chronic kidney disease. EXAM: RENAL / URINARY TRACT ULTRASOUND COMPLETE COMPARISON:  CT abdomen pelvis dated March 16, 2012. FINDINGS: Right Kidney: Renal measurements: 10.7 x 5.9 x 6.0 cm = volume: 200 mL. Echogenicity within normal limits. No mass or hydronephrosis visualized. 1.8 cm bilobed cyst with single thin internal septation. Left Kidney: Renal measurements: 12.1 x 4.5 x 3.8 cm = volume: 108 mL. Echogenicity within normal limits. No mass  or hydronephrosis visualized. 1.2 cm cyst with focal peripheral calcification. Bladder: Appears normal for degree of bladder distention. Other: None. IMPRESSION: 1. No acute abnormality. 2. Small bilateral mildly complex renal cysts. Electronically Signed   By: Titus Dubin M.D.   On: 07/14/2020 13:46   MYOCARDIAL PERFUSION IMAGING  Result Date: 07/09/2020  There was no ST segment deviation noted  during stress.  The left ventricular ejection fraction is mildly decreased (45-54%).  Nuclear stress EF: 52%.  Defect 1: There is a small defect of mild severity present in the basal anteroseptal and mid anteroseptal location.  Findings consistent with prior myocardial infarction.  This is a low risk study.  1. Fixed mid to apical anteroseptal perfusion defect with hypokinesis in this area, consistent with infarct.  LV systolic function mildly reduced (EF 52%) 2. Fixed inferior perfusion defect with normal wall motion, consistent with artifact 3. Fixed perfusion defect at apex with normal wall motion, consistent with artifact 4. Low risk study.  No ischemia.   Disposition   Pt is being discharged home today in good condition.  Follow-up Plans & Appointments   Follow-up arranged with APP on 08/14/20 at 3:15 pm   Follow-up Information    Almyra Deforest, [place] Follow up on 08/14/2020.   Specialties: Cardiology, Radiology Why: Please arrive 15 minutes early to 3:15 PM for your posthospital follow-up appointment with cardiology Contact information: [address] [place] [address] [place] Follow up on 08/11/2020.   Specialty: Cardiology Why: Please arrive between 8-4 for your blood work, no appointment needed, no fasting needed. Contact information: [address] [place] [phone]             Discharge Instructions    Amb Referral to Cardiac Rehabilitation   Complete by: As directed    Diagnosis:  Coronary Stents PTCA     After initial evaluation and assessments completed: Virtual Based Care may be provided alone or in conjunction with Phase 2 Cardiac Rehab based on patient barriers.: Yes   Diet - low sodium heart healthy   Complete by: As directed    Discharge instructions   Complete by: As directed    Please resume  your chlorthalidone 12.5mg , telmisartan 40mg , and Metformin 500mg   tomorrow, obtain blood work on 5/10 at [employer] office for re-evaluation of your kidney function, no appointment needed. Follow up with cardiology on 5/13 as scheduled to review results.   Avoid taking NSAIDs such as ibuprofen, advil, motrin, mobic   Take your new medication Plavix every day   PLEASE REMEMBER TO BRING ALL OF YOUR MEDICATIONS TO EACH OF YOUR FOLLOW-UP OFFICE VISITS.  PLEASE ATTEND ALL SCHEDULED FOLLOW-UP APPOINTMENTS.   Activity: Increase activity slowly as tolerated. You may shower, but no soaking baths (or swimming) for 1 week. No driving for 24 hours. No lifting over 5 lbs for 1 week. No sexual activity for 1 week.   Right wrist care: You may wash cath site gently with soap and water. Keep cath site clean and dry. If you notice pain, swelling, bleeding or pus at your cath site, please call [phone].   Increase activity slowly   Complete by: As directed       Discharge Medications   Allergies as of 08/05/2020      Reactions   Betadine [povidone Iodine] Anaphylaxis   Contrast Media [iodinated Diagnostic Agents] Anaphylaxis, Rash   Other Reaction: Other reaction  Iodine Anaphylaxis   Shellfish, IVP dye. Swelling in throat and foaming at mouth.   Shellfish Allergy Anaphylaxis      Medication List    STOP taking these medications   predniSONE 50 MG tablet Commonly known as: DELTASONE     TAKE these medications   allopurinol 300 MG tablet Commonly known as: ZYLOPRIM Take 1 tablet (300 mg total) by mouth daily.   apixaban 2.5 MG Tabs tablet Commonly known as: ELIQUIS Take 1 tablet (2.5 mg total) by mouth 2 (two) times daily. What changed:   medication strength  how much to take   carvedilol 6.25 MG tablet Commonly known as: COREG Take 12.5 mg by mouth 2 (two) times daily with a meal.   chlorthalidone 25 MG tablet Commonly known as: HYGROTON Take 12.5 mg by mouth in the morning.   clopidogrel 75 MG tablet Commonly known as: PLAVIX Take 1  tablet (75 mg total) by mouth daily. Start taking on: Aug 06, 2020   diltiazem 240 MG 24 hr capsule Commonly known as: CARDIZEM CD Take 240 mg by mouth daily.   lidocaine 5 % ointment Commonly known as: XYLOCAINE Apply 1 application topically daily as needed for mild pain or moderate pain.   Lidocaine 4 % Ptch Apply 1 patch topically daily as needed (Pain).   metFORMIN 500 MG tablet Commonly known as: GLUCOPHAGE Take 1 tablet (500 mg total) by mouth 2 (two) times daily with a meal. What changed: when to take this   nitroGLYCERIN 0.4 MG SL tablet Commonly known as: Nitrostat Place 1 tablet (0.4 mg total) under the tongue every 5 (five) minutes as needed for chest pain.   OneTouch Ultra test strip Generic drug: glucose blood USE TO TEST BLOOD SUGAR ONCE OR TWICE A DAY (E11.22)   oxybutynin 15 MG 24 hr tablet Commonly known as: DITROPAN XL Take 1 tablet (15 mg total) by mouth at bedtime.   rosuvastatin 20 MG tablet Commonly known as: CRESTOR Take 20 mg by mouth every evening.   tamsulosin 0.4 MG Caps capsule Commonly known as: FLOMAX Take 1 capsule (0.4 mg total) by mouth at bedtime.   telmisartan 40 MG tablet Commonly known as: MICARDIS Take 1 tablet (40 mg total) by mouth daily.   vitamin D3 50 MCG (2000 UT) Caps Take 2,000 Units by mouth daily.   Voltaren 1 % Gel Generic drug: diclofenac Sodium Apply 2 g topically daily as needed (Pain).          Outstanding Labs/Studies   Need to repeat BMP on 08/11/20 at the office    Duration of Discharge Encounter    Greater than 30 minutes including physician time.  Signed, Margie Billet, NP 08/05/2020, 10:58 AM

## 2020-08-05 NOTE — Discharge Summary (Signed)
   Right radial cath site is unremarkable.  Creatinine is stable at 2.02.  Will resume all outpatient medication and discharged today.  Basic metabolic panel in 5 days.  Noted to have moderate bradycardia.  We will leave to Dr. Ellyn Hack whether beta-blocker/diltiazem combination needs adjustment.  Discharged today.

## 2020-08-05 NOTE — Plan of Care (Signed)

## 2020-08-05 NOTE — Progress Notes (Signed)
Pt just walked with PT. Now asleep. Reviewed ed with wife. Eager to do CRPII.  [id number] Yves Dill CES, ACSM 10:09 AM 08/05/2020

## 2020-08-05 NOTE — Progress Notes (Signed)
Physical Therapy Treatment Patient Details Name: [name] MRN: [id number] DOB: 12/14/1935 Today's Date: 08/05/2020    History of Present Illness Pt is an 85 y.o. male admitted 08/03/20 for L heart cath with PCI. PMH includes PAF, HTN, DM2, CAD, OSA on CPAP, HOH, syncope, arthritis, depression.    PT Comments    Pt requires physical assistance to perform sit> stand transfer to minimize weightbearing through R UE, but demonstrates ability to ambulate for increased distances compared to last session. Pt demonstrates mild instability during gait and stair negotiation, reporting that this is similar to his baseline. Pt continues to demonstrate deficits in balance and activity tolerance, and will benefit from continued acute PT to improve independent mobility and safety.   Follow Up Recommendations  No PT follow up;Supervision - Intermittent     Equipment Recommendations  None recommended by PT    Recommendations for Other Services       Precautions / Restrictions Precautions Precautions: Fall;Other (comment) Precaution Comments: R wrist TR band Restrictions Weight Bearing Restrictions: No Other Position/Activity Restrictions: R wrist WB restrictions post-cath    Mobility  Bed Mobility Overal bed mobility: Needs Assistance Bed Mobility: Supine to Sit     Supine to sit: Modified independent (Device/Increase time)     General bed mobility comments: HOB elevated.    Transfers Overall transfer level: Needs assistance Equipment used: Straight cane Transfers: Sit to/from Stand Sit to Stand: Min assist (Pt requires min A to limit WB'ing through R wrist.)            Ambulation/Gait Ambulation/Gait assistance: Min guard Gait Distance (Feet): 575 Feet Assistive device: Straight cane;IV Pole (IV pole used for UE support for first half of distance.) Gait Pattern/deviations: Shuffle;Step-through pattern;Decreased stride length;Trunk flexed Gait velocity: Decreased Gait  velocity interpretation: 1.31 - 2.62 ft/sec, indicative of limited community ambulator General Gait Details: Pt uses IV pole for support during first bout of gait to stairs. Pt demonstrates some mild instability during gait.   Stairs Stairs: Yes Stairs assistance: Min guard Stair Management: Two rails;Alternating pattern Number of Stairs: 3 General stair comments: Pt requires B handrail use for increased stability during stair negotiation.   Wheelchair Mobility    Modified Rankin (Stroke Patients Only)       Balance Overall balance assessment: Needs assistance   Sitting balance-Leahy Scale: Good       Standing balance-Leahy Scale: Fair Standing balance comment: Pt is able to stand without UE to don facemask.                            Cognition Arousal/Alertness: Awake/alert Behavior During Therapy: WFL for tasks assessed/performed Overall Cognitive Status: Within Functional Limits for tasks assessed                                        Exercises      General Comments General comments (skin integrity, edema, etc.): Pt requires min A during session for sit>stand to prevent weightbearing through R wrist.      Pertinent Vitals/Pain Pain Assessment: No/denies pain    Home Living                      Prior Function            PT Goals (current goals can now be found in the care plan  section) Acute Rehab PT Goals Patient Stated Goal: Return home. Progress towards PT goals: Progressing toward goals    Frequency    Min 3X/week      PT Plan Current plan remains appropriate    Co-evaluation              AM-PAC PT "6 Clicks" Mobility   Outcome Measure  Help needed turning from your back to your side while in a flat bed without using bedrails?: None Help needed moving from lying on your back to sitting on the side of a flat bed without using bedrails?: None Help needed moving to and from a bed to a chair  (including a wheelchair)?: A Little Help needed standing up from a chair using your arms (e.g., wheelchair or bedside chair)?: A Little Help needed to walk in hospital room?: A Little Help needed climbing 3-5 steps with a railing? : A Little 6 Click Score: 20    End of Session Equipment Utilized During Treatment: Gait belt Activity Tolerance: Patient tolerated treatment well Patient left: in bed;with call bell/phone within reach;with family/visitor present Nurse Communication: Mobility status PT Visit Diagnosis: Unsteadiness on feet (R26.81);Other abnormalities of gait and mobility (R26.89)     Time: 8478-4128 PT Time Calculation (min) (ACUTE ONLY): 25 min  Charges:                        Acute Rehab  Pager: [phone]    Garwin Brothers, SPT  08/05/2020, 10:37 AM

## 2020-08-06 ENCOUNTER — Other Ambulatory Visit (HOSPITAL_COMMUNITY): Payer: Self-pay

## 2020-08-06 ENCOUNTER — Other Ambulatory Visit: Payer: Self-pay

## 2020-08-06 ENCOUNTER — Ambulatory Visit (INDEPENDENT_AMBULATORY_CARE_PROVIDER_SITE_OTHER): Payer: Medicare Other | Admitting: Physician Assistant

## 2020-08-06 ENCOUNTER — Encounter: Payer: Self-pay | Admitting: Physician Assistant

## 2020-08-06 VITALS — BP 156/82 | HR 56 | Ht 69.0 in | Wt 203.2 lb

## 2020-08-06 DIAGNOSIS — E119 Type 2 diabetes mellitus without complications: Secondary | ICD-10-CM | POA: Diagnosis not present

## 2020-08-06 DIAGNOSIS — N179 Acute kidney failure, unspecified: Secondary | ICD-10-CM

## 2020-08-06 DIAGNOSIS — Z79899 Other long term (current) drug therapy: Secondary | ICD-10-CM

## 2020-08-06 DIAGNOSIS — I2510 Atherosclerotic heart disease of native coronary artery without angina pectoris: Secondary | ICD-10-CM | POA: Diagnosis not present

## 2020-08-06 DIAGNOSIS — I480 Paroxysmal atrial fibrillation: Secondary | ICD-10-CM

## 2020-08-06 DIAGNOSIS — I10 Essential (primary) hypertension: Secondary | ICD-10-CM

## 2020-08-06 DIAGNOSIS — E785 Hyperlipidemia, unspecified: Secondary | ICD-10-CM

## 2020-08-06 DIAGNOSIS — Z9861 Coronary angioplasty status: Secondary | ICD-10-CM

## 2020-08-06 DIAGNOSIS — M25552 Pain in left hip: Secondary | ICD-10-CM

## 2020-08-06 NOTE — Patient Instructions (Signed)
Medication Instructions:   HOLD Chlorthalidone  HOLD Telemisartan  *If you need a refill on your cardiac medications before your next appointment, please call your pharmacy*  Lab Work: Your physician recommends that you return for lab work on Tuesday 08/11/20:   BMET  If you have labs (blood work) drawn today and your tests are completely normal, you will receive your results only by: Marland Kitchen MyChart Message (if you have MyChart) OR . A paper copy in the mail If you have any lab test that is abnormal or we need to change your treatment, we will call you to review the results.  Testing/Procedures: You have been referred to Orthopedics for your hip pain   Follow-Up: At [facility], you and your health needs are our priority.  As part of our continuing mission to provide you with exceptional heart care, we have created designated Provider Care Teams.  These Care Teams include your primary Cardiologist (physician) and Advanced Practice Providers (APPs -  Physician Assistants and Nurse Practitioners) who all work together to provide you with the care you need, when you need it.  Your next appointment:   2 week(s)  The format for your next appointment:   In Person  Provider:   APP  Other Instructions

## 2020-08-06 NOTE — Progress Notes (Signed)
Cardiology Office Note:    Date:  08/07/2020   ID:  [name], DOB 06-Jun-1935, MRN [id number]  PCP:  Lavone Orn, MD   [place] Providers Cardiologist:  Glenetta Hew, MD {  Referring MD: Lavone Orn, MD   Chief Complaint  Patient presents with  . Follow-up    Seen for Dr. Ellyn Hack    History of Present Illness:    [name] is a 85 y.o. male with a hx of CAD, hypertension, hyperlipidemia, DM 2, OSA on CPAP and history of PAF.  He has a history of significant disability for spinal cord tumor, this was resected.  Due to recent chest discomfort, patient underwent a Myoview on 07/09/2020 that showed fixed mid to apical with anteroseptal perfusion defect with hypokinesia consistent with previous infarct, EF 55%.  He underwent planned cardiac catheterization on 08/03/2020 that showed 40% proximal LAD, 75% mid LAD, 65% proximal left circumflex artery, 30% proximal left circumflex artery, 75% mid left circumflex lesion lesion treated with Cypher DES.  Postprocedure, patient was started on aspirin and Plavix.  He underwent staged PCI on 08/04/2020 with Synergy DES placement in proximal left circumflex artery and mid LAD.  Patient presents today for cardiology follow-up.  He has been compliant with Plavix therapy.  He denies any chest pain, shortness of breath or lower extremity edema.  I reviewed recent lab work, renal function is still not back to baseline yet.  I would recommend continue to hold chlorthalidone and telmisartan until he can get repeat blood work next Tuesday.  If renal function does not improve, he can restart chlorthalidone and the telmisartan.  His primary issue at this time is hip pain which has been present even prior to cardiac catheterization.  I recommend he sees a orthopedic doctor for this.  We will make referral to [facility].    Past Medical History:  Diagnosis Date  . Arthritis   . CAD S/P percutaneous coronary angioplasty 08/2004   CARDIOLOGIST-   DR DAVID HARDING; DES to LCx. X2  - Cypher 2.5 mm postdilated to 2.75 mm (20 mm and 13 mm stents);; Myoview 09/2014: LOW RISK. No Infarct or Ischemia.  . Colon polyps 05/10/2005   Hyperplastic polyps  . Complication of anesthesia    "takes long time to wake up"  . Depression   . Diabetes mellitus type 2 with complications (HCC)    neuropathy; CAD borderline- no med  . Diastolic dysfunction without heart failure    Moderate LVH, Gr 1 DD on Echo 2011; no CHF admissions, no tt on diuretisc  . Essential hypertension   . H/O: gout STABLE  . History of melanoma excision 2010   FOREHEAD  . History of prostate cancer 2004--  S/P SEED IMPLANTS    NO RECURRENCE  . Hydrocele, left   . Hyperlipidemia LDL goal <70   . Impaired hearing RIGHT HEARING AID  . OSA on CPAP    bipap  . PAF (paroxysmal atrial fibrillation) ([place]) 12/11/2017   Admitted with Afib RVR - converted on IV Diltiazem. d/c on Eliquis; CHA2DS2Vasc = 5.  . Prostate cancer ([place])    prostate , melanoma head  . Shingles 07  . Syncope and collapse March 2017   Likely related to post micturition, Neurontin and dehydration with orthostatic hypotension  . Urgency of urination     Past Surgical History:  Procedure Laterality Date  . Cardiac Event Monitor  3-07/2015   Mostly SR - 58-132 bpm. Rare PVCs (occ bigeminy),  Frequent PACs - singlets, couplets - short runs of PAT.  . circumsision    . CORONARY ANGIOPLASTY WITH STENT PLACEMENT  09-01-2004     Cypher DES x 2 d-m LCx 2.5 mm x 20 mm & 2.5 mm x 13 mm (~2.75 mm)  . CORONARY STENT INTERVENTION N/A 08/04/2020   Procedure: CORONARY STENT INTERVENTION;  Surgeon: Leonie Man, MD;  Location: [place] [facility];  Service: Cardiovascular;  Laterality: N/A;  . EXCISION MELANOMA FROM FOREHEAD  2010  . EYE SURGERY Bilateral    cataracts  . HYDROCELE EXCISION  10/14/2011   Procedure: HYDROCELECTOMY ADULT;  Surgeon: Ailene Rud, MD;  Location: [facility];  Service:  Urology;  Laterality: Left;  . LAMINECTOMY N/A 07/13/2012   Procedure: THORACIC LAMINECTOMY FOR RESECTION OF INTRAMEDULLARY SPINAL CHORD TUMOR WITH SPINAL CHORD MONITORING;  Surgeon: Erline Levine, MD;  Location: [place] [facility];  Service: Neurosurgery;  Laterality: N/A;  Thoracic laminectomy for resection of intramedullary spinal cord tumor with spinal cord monitering and Dr. Christella Noa to assist  . LAMINECTOMY N/A 12/09/2014   Procedure: Redo Thoracic laminectomy with intramedullary tumar resection/USN/[place]/Subarachnoid shunt/spinal cord monitoring/Dr. Kathyrn Sheriff to assist;  Surgeon: Erline Levine, MD;  Location: [place] [facility];  Service: Neurosurgery;  Laterality: N/A;  Redo Thoracic laminectomy with intramedullary tumar resection/USN/[place]/Subarachnoid shunt/spinal cord monitoring/Dr. Kathyrn Sheriff to assist  . LEFT HEART CATH AND CORONARY ANGIOGRAPHY N/A 08/03/2020   Procedure: LEFT HEART CATH AND CORONARY ANGIOGRAPHY;  Surgeon: Leonie Man, MD;  Location: [place] [facility];  Service: Cardiovascular;  Laterality: N/A;  . NM MYOVIEW LTD  3/212014; June 2016   Both Lexiscan: a. LOW RISK, mild inferior bowel artifact; No ischemia or Infarction; b. Low risk, normal study. No infarction or ischemia. No artifact.  Marland Kitchen NM MYOVIEW LTD  07/09/2020   Fixed mid to apical anteroseptal perfusion defect with hypokinesia in this area consistent with prior infarct.  EF estimated 50%.  Also fixed inferior perfusion defect and apical defect with normal wall motion consistent with artifact.  Read as low risk, no ischemia, however there is a significant anterior defect not present on previous study..  . PROSTATE PALLADIUM GOLD SEED IMPLANTS (118)  11-15-2002   PROSTATE CANCER  . SKIN BIOPSY    . TONSILLECTOMY    . TRANSTHORACIC ECHOCARDIOGRAM  12/2017   (A. fib): Moderate concentric LVH.  EF 55 to 60%.  No wall motion normality.  Mild to moderately dilated left atrium.    Current Medications: Current Meds  Medication Sig  .  allopurinol (ZYLOPRIM) 300 MG tablet Take 1 tablet (300 mg total) by mouth daily.  Marland Kitchen apixaban (ELIQUIS) 2.5 MG TABS tablet Take 1 tablet (2.5 mg total) by mouth 2 (two) times daily.  . carvedilol (COREG) 6.25 MG tablet Take 12.5 mg by mouth 2 (two) times daily with a meal.  . chlorthalidone (HYGROTON) 25 MG tablet Take 12.5 mg by mouth in the morning.  . Cholecalciferol (VITAMIN D3) 50 MCG (2000 UT) CAPS Take 2,000 Units by mouth daily.  . clopidogrel (PLAVIX) 75 MG tablet Take 1 tablet (75 mg total) by mouth daily.  . diclofenac Sodium (VOLTAREN) 1 % GEL Apply 2 g topically daily as needed (Pain).  Marland Kitchen diltiazem (CARDIZEM CD) 240 MG 24 hr capsule Take 240 mg by mouth daily.  Marland Kitchen lidocaine (XYLOCAINE) 5 % ointment Apply 1 application topically daily as needed for mild pain or moderate pain.  . Lidocaine 4 % PTCH Apply 1 patch topically daily as  needed (Pain).  . metFORMIN (GLUCOPHAGE) 500 MG tablet Take 1 tablet (500 mg total) by mouth 2 (two) times daily with a meal. (Patient taking differently: Take 500 mg by mouth in the morning.)  . nitroGLYCERIN (NITROSTAT) 0.4 MG SL tablet Place 1 tablet (0.4 mg total) under the tongue every 5 (five) minutes as needed for chest pain.  Glory Rosebush ULTRA test strip USE TO TEST BLOOD SUGAR ONCE OR TWICE A DAY (E11.22)  . oxybutynin (DITROPAN XL) 15 MG 24 hr tablet Take 1 tablet (15 mg total) by mouth at bedtime.  . rosuvastatin (CRESTOR) 20 MG tablet Take 20 mg by mouth every evening.  . tamsulosin (FLOMAX) 0.4 MG CAPS capsule Take 1 capsule (0.4 mg total) by mouth at bedtime.  Marland Kitchen telmisartan (MICARDIS) 40 MG tablet Take 1 tablet (40 mg total) by mouth daily.     Allergies:   Betadine [povidone iodine], Contrast media [iodinated diagnostic agents], Iodine, and Shellfish allergy   Social History   Socioeconomic History  . Marital status: Married    Spouse name: Not on file  . Number of children: Not on file  . Years of education: Not on file  . Highest  education level: Not on file  Occupational History  . Not on file  Tobacco Use  . Smoking status: Never Smoker  . Smokeless tobacco: Never Used  . Tobacco comment: occ alcohol  Vaping Use  . Vaping Use: Never used  Substance and Sexual Activity  . Alcohol use: Yes    Comment: OCCASIONAL  . Drug use: No  . Sexual activity: Not on file  Other Topics Concern  . Not on file  Social History Narrative   He is married, father of 2, grandfather of 71. Does not really get exercise now because   of his back issues. Does not smoke and drinks social alcohol. Works in [employer].   Social Determinants of Health   Financial Resource Strain: Not on file  Food Insecurity: Not on file  Transportation Needs: Not on file  Physical Activity: Not on file  Stress: Not on file  Social Connections: Not on file     Family History: The patient's family history includes Hypertension in his brother, father, and mother; Prostate cancer in his brother and father; Stroke in his mother.  ROS:   Please see the history of present illness.     All other systems reviewed and are negative.  EKGs/Labs/Other Studies Reviewed:    The following studies were reviewed today:  Cath 0/05/1115  LV end diastolic pressure is mildly elevated, in the setting of systemic hypertension  ---------------------------  Prox RCA to Dist RCA lesion is 99% stenosed with 90% stenosed side branch in RV Branch. Dist RCA lesion is 100% stenosed.  Prox LAD lesion is 40% stenosed.  Mid LAD lesion is 75% stenosed.  Prox Cx lesion is 65% stenosed.  Prox Cx to Mid Cx lesion is 30% stenosed.  Mid Cx lesion is 75% stenosed.  Previously placed Mid Cx to Dist Cx stent (Cypher DES) is widely patent.   SUMMARY  Severe multivessel CAD: --> Proximal LAD 40% with severely diseased D1 followed by a large septal perforator (SP1 provides collaterals to RPDA), just after SP1 there is eccentric 75% stenosis in the LAD.  Diffuse  downstream disease in the LAD of 20 to 30% with a wraparound LAD providing collaterals to RPL branches. --> Focal 65 % LCx stenosis just after takeoff of OM1 followed by tapering 30% stenosis  down to 75% just at the takeoff of the AV groove circumflex.  The follow-on circumflex has a widely patent stent and gives rise to a large posterolateral OM 3 branch --> 99% proximal RCA with trace flow to the distal proximal where it is not 100% occluded.  Fills via collaterals from AVG LCx, SP1 and distal LAD  Systemic hypertension with moderately elevated LVEDP   RECOMMENDATIONS  Technically, he has operative disease, but is not a good surgical candidate with his significant back issues.  He would not likely recover well.  He is also very very fearful of major surgeries.  As such, I think PCI is the best option.  With renal insufficiency, we will admit under observation status tonight to allow for early morning hydration and first case two-vessel PCI of the LAD and LCx.  He will need to be repremedicated with prednisone.  We will hydrate him today, cover antihypertension with IV labetalol and hydralazine along with oral amlodipine while we are holding ARB and chlorthalidone. ? Plan will be to restart ARB and chlorthalidone 1 day post PCI.  Loaded with clopidogrel 300 mg tonight and 75 mg daily starting tomorrow.    We will give 325 mg of aspirin tomorrow morning, but would not discharge him on aspirin he will be on clopidogrel plus apixaban (which will be restarted tomorrow)  Continue statin.    Cath 08/04/2020  Prox Cx lesion is 65% stenosed. Prox Cx to Mid Cx lesion is 30% stenosed. Mid Cx lesion is 75% stenosed. Non-stenotic Mid Cx to Dist Cx stent was previously treated.  A drug-eluting stent was successfully placed (covering all 3 lesions and crossing OM1, overlapping previous stent) using a SYNERGY XD 2.75X38. Deployed to 3.0 mm.  Post intervention, there is a 5% residual stenosis in  the AV groove ostium. Otherwise 0% residual stenosis throughout including at the overlap site.  --------------------------  Prox LAD lesion is 40% stenosed.  Mid LAD lesion is 75% stenosed.  A drug-eluting stent was successfully placed using a SYNERGY XD 2.50X20. Postdilated to 3.1 mm.  Post intervention, there is a 0% residual stenosis.   SUMMARY  Successful 2-vessel PCI:  Mid to distal LCx lesions treated with a single Synergy DES 2.7 mm x 38 mm postdilated to 3.0 mm.  (Overlaps previous stent, and crosses OM1 proximally)  Mid LAD lesion treated with Synergy DES 2.55mm x 20 mm (postdilated tapered from 3.1 to 2.8 mm)  Moderately elevated LVEDP of 19 mmHg.  RECOMMENDATIONS  8 HR post-cath hydration - but will give 1 dose Lasix 40 mg x 1 given LVEDP 19 mmHg ? Recheck BMP ~1600 - consider potential d/c in PM  F/u with me when he returns from his trip.    EKG:  EKG is not ordered today.   Recent Labs: 08/05/2020: BUN 43; Creatinine, Ser 2.04; Hemoglobin 11.5; Platelets 180; Potassium 3.6; Sodium 139  Recent Lipid Panel    Component Value Date/Time   CHOL 106 08/05/2020 0949   CHOL 145 10/02/2012 1002   TRIG 121 08/05/2020 0949   TRIG 157 (H) 10/02/2012 1002   HDL 38 (L) 08/05/2020 0949   HDL 36 (L) 10/02/2012 1002   CHOLHDL 2.8 08/05/2020 0949   VLDL 24 08/05/2020 0949   LDLCALC 44 08/05/2020 0949   LDLCALC 78 10/02/2012 1002     Risk Assessment/Calculations:    CHA2DS2-VASc Score = 5  This indicates a 7.2% annual risk of stroke. The patient's score is based upon: CHF History: No HTN  History: Yes Diabetes History: Yes Stroke History: No Vascular Disease History: Yes Age Score: 2 Gender Score: 0      Physical Exam:    VS:  BP (!) 156/82   Pulse (!) 56   Ht 5\' 9"  (1.753 m)   Wt 203 lb 3.2 oz (92.2 kg)   SpO2 97%   BMI 30.01 kg/m     Wt Readings from Last 3 Encounters:  08/06/20 203 lb 3.2 oz (92.2 kg)  08/05/20 201 lb 6.4 oz (91.4 kg)   07/30/20 201 lb 3.2 oz (91.3 kg)     GEN:  Well nourished, well developed in no acute distress HEENT: Normal NECK: No JVD; No carotid bruits LYMPHATICS: No lymphadenopathy CARDIAC: RRR, no murmurs, rubs, gallops RESPIRATORY:  Clear to auscultation without rales, wheezing or rhonchi  ABDOMEN: Soft, non-tender, non-distended MUSCULOSKELETAL:  No edema; No deformity  SKIN: Warm and dry NEUROLOGIC:  Alert and oriented x 3 PSYCHIATRIC:  Normal affect   ASSESSMENT:    1. Coronary artery disease involving native coronary artery of native heart without angina pectoris   2. Left hip pain   3. Medication management   4. Essential hypertension   5. Hyperlipidemia LDL goal <70   6. Controlled type 2 diabetes mellitus without complication, without long-term current use of insulin ([place])   7. PAF (paroxysmal atrial fibrillation) ([place])   8. AKI (acute kidney injury) ([place])    PLAN:    In order of problems listed above:  1. CAD: Underwent recent PCI.  Continue Plavix.  Not on aspirin given the need for Eliquis  2. Left hip pain: This is chronic for him, I recommend he sees a orthopedic doctor.  We will make referral to [facility]  3. Hypertension: Blood pressure elevated, will hold chlorthalidone and telmisartan for the time being given AKI.  Plan to repeat basic metabolic panel next Tuesday, if renal function improved, will restart chlorthalidone and telmisartan  4. Hyperlipidemia: On Crestor  5. DM2: Managed per primary care provider  6. PAF: On Eliquis, if renal function does not improve, may qualify for 5 mg twice a day of Eliquis in the future.  7. AKI: Plan for repeat basic metabolic panel next Tuesday.        Medication Adjustments/Labs and Tests Ordered: Current medicines are reviewed at length with the patient today.  Concerns regarding medicines are outlined above.  Orders Placed This Encounter  Procedures  . Basic metabolic panel  . AMB referral to orthopedics   No  orders of the defined types were placed in this encounter.   Patient Instructions  Medication Instructions:   HOLD Chlorthalidone  HOLD Telemisartan  *If you need a refill on your cardiac medications before your next appointment, please call your pharmacy*  Lab Work: Your physician recommends that you return for lab work on Tuesday 08/11/20:   BMET  If you have labs (blood work) drawn today and your tests are completely normal, you will receive your results only by: Marland Kitchen MyChart Message (if you have MyChart) OR . A paper copy in the mail If you have any lab test that is abnormal or we need to change your treatment, we will call you to review the results.  Testing/Procedures: You have been referred to Orthopedics for your hip pain   Follow-Up: At [facility], you and your health needs are our priority.  As part of our continuing mission to provide you with exceptional heart care, we have created designated Provider Care Teams.  These Care Teams include your primary Cardiologist (physician) and Advanced Practice Providers (APPs -  Physician Assistants and Nurse Practitioners) who all work together to provide you with the care you need, when you need it.  Your next appointment:   2 week(s)  The format for your next appointment:   In Person  Provider:   APP  Other Instructions      Signed, Almyra Deforest, [place]  08/07/2020 11:43 PM    [place]

## 2020-08-07 ENCOUNTER — Telehealth (HOSPITAL_COMMUNITY): Payer: Self-pay

## 2020-08-07 ENCOUNTER — Telehealth (HOSPITAL_COMMUNITY): Payer: Self-pay | Admitting: Pharmacist

## 2020-08-07 ENCOUNTER — Encounter: Payer: Self-pay | Admitting: Physician Assistant

## 2020-08-07 DIAGNOSIS — M5459 Other low back pain: Secondary | ICD-10-CM | POA: Diagnosis not present

## 2020-08-07 DIAGNOSIS — M25552 Pain in left hip: Secondary | ICD-10-CM | POA: Insufficient documentation

## 2020-08-07 NOTE — Telephone Encounter (Signed)
Briefly spoke with patient and wife.  They were able to tell me names of both Plavix and Eliquis.  Had to terminate call as they were at Physician's office

## 2020-08-07 NOTE — Telephone Encounter (Signed)
Pt insurance is active and benefits verified through Medicare A/B. Co-pay $0.00, DED $233.00/$233.00 met, out of pocket $0.00/$0.00 met, co-insurance 20%. No pre-authorization required. Passport, 08/07/20 @ 12:24PM, [phone]  2ndary insurance is active and benefits verified through [place]. Co-pay $0.00, DED $0.00/$0.00 met, out of pocket $0.00/$0.00 met, co-insurance 0%. No pre-authorization required. Passport, 08/07/20 @ 12:26PM, NOM#[id number]  Will contact patient to see if he is interested in the Cardiac Rehab Program. If interested, patient will need to complete follow up appt. Once completed, patient will be contacted for scheduling upon review by the RN Navigator.

## 2020-08-07 NOTE — Telephone Encounter (Signed)
Called patient to see if he is interested in the Cardiac Rehab Program. Patient expressed interest. Explained scheduling process and went over insurance, patient verbalized understanding. Will contact patient for scheduling once f/u has been completed.  °

## 2020-08-11 DIAGNOSIS — E782 Mixed hyperlipidemia: Secondary | ICD-10-CM | POA: Diagnosis not present

## 2020-08-11 DIAGNOSIS — Z79899 Other long term (current) drug therapy: Secondary | ICD-10-CM | POA: Diagnosis not present

## 2020-08-11 DIAGNOSIS — I480 Paroxysmal atrial fibrillation: Secondary | ICD-10-CM | POA: Diagnosis not present

## 2020-08-11 DIAGNOSIS — N1832 Chronic kidney disease, stage 3b: Secondary | ICD-10-CM | POA: Diagnosis not present

## 2020-08-11 DIAGNOSIS — E1122 Type 2 diabetes mellitus with diabetic chronic kidney disease: Secondary | ICD-10-CM | POA: Diagnosis not present

## 2020-08-11 DIAGNOSIS — I10 Essential (primary) hypertension: Secondary | ICD-10-CM | POA: Diagnosis not present

## 2020-08-11 DIAGNOSIS — F325 Major depressive disorder, single episode, in full remission: Secondary | ICD-10-CM | POA: Diagnosis not present

## 2020-08-11 DIAGNOSIS — I2510 Atherosclerotic heart disease of native coronary artery without angina pectoris: Secondary | ICD-10-CM | POA: Diagnosis not present

## 2020-08-11 LAB — BASIC METABOLIC PANEL
BUN/Creatinine Ratio: 17 (ref 10–24)
BUN: 31 mg/dL — ABNORMAL HIGH (ref 8–27)
CO2: 22 mmol/L (ref 20–29)
Calcium: 9 mg/dL (ref 8.6–10.2)
Chloride: 101 mmol/L (ref 96–106)
Creatinine, Ser: 1.85 mg/dL — ABNORMAL HIGH (ref 0.76–1.27)
Glucose: 116 mg/dL — ABNORMAL HIGH (ref 65–99)
Potassium: 4.3 mmol/L (ref 3.5–5.2)
Sodium: 138 mmol/L (ref 134–144)
eGFR: 36 mL/min/{1.73_m2} — ABNORMAL LOW (ref >59)

## 2020-08-12 DIAGNOSIS — M533 Sacrococcygeal disorders, not elsewhere classified: Secondary | ICD-10-CM | POA: Insufficient documentation

## 2020-08-14 ENCOUNTER — Other Ambulatory Visit (HOSPITAL_COMMUNITY): Payer: Self-pay

## 2020-08-14 ENCOUNTER — Ambulatory Visit: Payer: Medicare Other | Admitting: Physician Assistant

## 2020-08-14 ENCOUNTER — Other Ambulatory Visit: Payer: Self-pay

## 2020-08-14 DIAGNOSIS — Z79899 Other long term (current) drug therapy: Secondary | ICD-10-CM

## 2020-08-14 DIAGNOSIS — F330 Major depressive disorder, recurrent, mild: Secondary | ICD-10-CM | POA: Diagnosis not present

## 2020-08-14 DIAGNOSIS — N1832 Chronic kidney disease, stage 3b: Secondary | ICD-10-CM | POA: Diagnosis not present

## 2020-08-19 ENCOUNTER — Telehealth: Payer: Self-pay | Admitting: Cardiology

## 2020-08-19 ENCOUNTER — Other Ambulatory Visit: Payer: Self-pay

## 2020-08-19 DIAGNOSIS — G4733 Obstructive sleep apnea (adult) (pediatric): Secondary | ICD-10-CM | POA: Diagnosis not present

## 2020-08-19 DIAGNOSIS — Z79899 Other long term (current) drug therapy: Secondary | ICD-10-CM

## 2020-08-19 DIAGNOSIS — R269 Unspecified abnormalities of gait and mobility: Secondary | ICD-10-CM | POA: Diagnosis not present

## 2020-08-19 DIAGNOSIS — F321 Major depressive disorder, single episode, moderate: Secondary | ICD-10-CM | POA: Diagnosis not present

## 2020-08-19 NOTE — Telephone Encounter (Signed)
Returned the call to the patient. He stated that he went to [place] recently but did not take his CPAP machine with him. He stated that he had a moment during the night when he woke up and was unable to breathe. This lasted for a few minutes (his wife stated that it was mere seconds). He has been advised that this could be due to him not wearing his CPAP while away. He has not had an episode again.  He stated that he would still like the message sent to Dr. Ellyn Hack for his input.

## 2020-08-19 NOTE — Telephone Encounter (Signed)
Patient states the other day he went to sleep and stopped breathing in the middle of the night. He states it happened once and has no other symptoms.

## 2020-08-20 DIAGNOSIS — M533 Sacrococcygeal disorders, not elsewhere classified: Secondary | ICD-10-CM | POA: Diagnosis not present

## 2020-08-24 NOTE — Progress Notes (Signed)
Cardiology Office Note:    Date:  08/26/2020   ID:  [name], DOB 02-16-1936, MRN [id number]  PCP:  Lavone Orn, MD  Cardiologist:  Glenetta Hew, MD   Referring MD: Lavone Orn, MD   Chief Complaint  Patient presents with  . Follow-up  s/p PCI  History of Present Illness:    [name] is a 85 y.o. male with a hx of PAF on eliquis, PACs,  HTN, CAD s/p DEX x 2 Cx, HLD, OSA on CPAP, CKD, obesity, and back pain. He has a history of CAD with remote PCI to Cx. Stroke PPX with eliquis. He is disabled due to spinal tumor s/p resection. He had an abnormal myoview stress test showing a new sizeable anterior perfusion defect along with multiple episodes of chest pain. He underwent diagnostic angiography on 08/03/20 that showed 99% proximal RCA lesion followed by 100% distal RCA occlusion with collaterals, 90% stenosis in RV branch, 40 % prox LAD, 75% mid LAD, 65% prox Cx followed by 30% stenosis and then 75% lesion. Distal Cx stent was widely patent. Extensive collaterals. Given CKD, he was admitted and staged PCI performed 08/04/20 with DES to Cx that overlapped previously placed stent and DES to 75% mid LAD lesion. Discharged on plavix and eliquis alone, no ASA. Discharged on reduced dose of eliquis for age and renal function. Serum creatinine was 2.04 at discharge and 1.84 at follow up. Telmisartan and HCTZ was restarted. Will need BMP at follow up.   He returns today for follow up. He is doing well post-cath. He denies chest pain. He is frustrated because he continues to be fatigued, which hasn't changed after PCI. However, per his wife, he has been fatigued for "years." His CPAP sounds different, followed by Dr. Halford Chessman. I will ask Dr. Halford Chessman to review his CPAP settings to see if this is related to his fatigue. He was bradycardic in the hospital, which could be contributory. He also just started zolof 1.5 weeks ago, which has been accompanied by diarrhea.   Past Medical History:  Diagnosis  Date  . Arthritis   . CAD S/P percutaneous coronary angioplasty 08/2004   CARDIOLOGIST-  DR DAVID HARDING; DES to LCx. X2  - Cypher 2.5 mm postdilated to 2.75 mm (20 mm and 13 mm stents);; Myoview 09/2014: LOW RISK. No Infarct or Ischemia.  . Colon polyps 05/10/2005   Hyperplastic polyps  . Complication of anesthesia    "takes long time to wake up"  . Depression   . Diabetes mellitus type 2 with complications (HCC)    neuropathy; CAD borderline- no med  . Diastolic dysfunction without heart failure    Moderate LVH, Gr 1 DD on Echo 2011; no CHF admissions, no tt on diuretisc  . Essential hypertension   . H/O: gout STABLE  . History of melanoma excision 2010   FOREHEAD  . History of prostate cancer 2004--  S/P SEED IMPLANTS    NO RECURRENCE  . Hydrocele, left   . Hyperlipidemia LDL goal <70   . Impaired hearing RIGHT HEARING AID  . OSA on CPAP    bipap  . PAF (paroxysmal atrial fibrillation) ([place]) 12/11/2017   Admitted with Afib RVR - converted on IV Diltiazem. d/c on Eliquis; CHA2DS2Vasc = 5.  . Prostate cancer ([place])    prostate , melanoma head  . Shingles 07  . Syncope and collapse March 2017   Likely related to post micturition, Neurontin and dehydration with orthostatic hypotension  .  Urgency of urination     Past Surgical History:  Procedure Laterality Date  . Cardiac Event Monitor  3-07/2015   Mostly SR - 58-132 bpm. Rare PVCs (occ bigeminy), Frequent PACs - singlets, couplets - short runs of PAT.  . circumsision    . CORONARY ANGIOPLASTY WITH STENT PLACEMENT  09-01-2004     Cypher DES x 2 d-m LCx 2.5 mm x 20 mm & 2.5 mm x 13 mm (~2.75 mm)  . CORONARY STENT INTERVENTION N/A 08/04/2020   Procedure: CORONARY STENT INTERVENTION;  Surgeon: Leonie Man, MD;  Location: [facility];  Service: Cardiovascular;  Laterality: N/A;  . EXCISION MELANOMA FROM FOREHEAD  2010  . EYE SURGERY Bilateral    cataracts  . HYDROCELE EXCISION  10/14/2011   Procedure: HYDROCELECTOMY  ADULT;  Surgeon: Ailene Rud, MD;  Location[facility];  Service: Urology;  Laterality: Left;  . LAMINECTOMY N/A 07/13/2012   Procedure: THORACIC LAMINECTOMY FOR RESECTION OF INTRAMEDULLARY SPINAL CHORD TUMOR WITH SPINAL CHORD MONITORING;  Surgeon: Erline Levine, MD;  Location: [facility];  Service: Neurosurgery;  Laterality: N/A;  Thoracic laminectomy for resection of intramedullary spinal cord tumor with spinal cord monitering and Dr. Christella Noa to assist  . LAMINECTOMY N/A 12/09/2014   Procedure: Redo Thoracic laminectomy with intramedullary tumar resection/USN/[place]/Subarachnoid shunt/spinal cord monitoring/Dr. Kathyrn Sheriff to assist;  Surgeon: Erline Levine, MD;  Location: [facility] [facility];  Service: Neurosurgery;  Laterality: N/A;  Redo Thoracic laminectomy with intramedullary tumar resection/USN/[place]/Subarachnoid shunt/spinal cord monitoring/Dr. Kathyrn Sheriff to assist  . LEFT HEART CATH AND CORONARY ANGIOGRAPHY N/A 08/03/2020   Procedure: LEFT HEART CATH AND CORONARY ANGIOGRAPHY;  Surgeon: Leonie Man, MD;  Location: [place];  Service: Cardiovascular;  Laterality: N/A;  . NM MYOVIEW LTD  3/212014; June 2016   Both Lexiscan: a. LOW RISK, mild inferior bowel artifact; No ischemia or Infarction; b. Low risk, normal study. No infarction or ischemia. No artifact.  Marland Kitchen NM MYOVIEW LTD  07/09/2020   Fixed mid to apical anteroseptal perfusion defect with hypokinesia in this area consistent with prior infarct.  EF estimated 50%.  Also fixed inferior perfusion defect and apical defect with normal wall motion consistent with artifact.  Read as low risk, no ischemia, however there is a significant anterior defect not present on previous study..  . PROSTATE PALLADIUM GOLD SEED IMPLANTS (118)  11-15-2002   PROSTATE CANCER  . SKIN BIOPSY    . TONSILLECTOMY    . TRANSTHORACIC ECHOCARDIOGRAM  12/2017   (A. fib): Moderate concentric LVH.  EF 55 to 60%.  No wall motion normality.  Mild to  moderately dilated left atrium.    Current Medications: Current Meds  Medication Sig  . allopurinol (ZYLOPRIM) 300 MG tablet Take 1 tablet (300 mg total) by mouth daily.  Marland Kitchen apixaban (ELIQUIS) 2.5 MG TABS tablet Take 1 tablet (2.5 mg total) by mouth 2 (two) times daily.  . carvedilol (COREG) 6.25 MG tablet Take 12.5 mg by mouth 2 (two) times daily with a meal.  . chlorthalidone (HYGROTON) 25 MG tablet Take 12.5 mg by mouth in the morning.  . Cholecalciferol (VITAMIN D3) 50 MCG (2000 UT) CAPS Take 2,000 Units by mouth daily.  . clopidogrel (PLAVIX) 75 MG tablet Take 1 tablet (75 mg total) by mouth daily.  . diclofenac Sodium (VOLTAREN) 1 % GEL Apply 2 g topically daily as needed (Pain).  Marland Kitchen diltiazem (CARDIZEM CD) 240 MG 24 hr capsule Take 240 mg by mouth daily.  Marland Kitchen  lidocaine (XYLOCAINE) 5 % ointment Apply 1 application topically daily as needed for mild pain or moderate pain.  . Lidocaine 4 % PTCH Apply 1 patch topically daily as needed (Pain).  . metFORMIN (GLUCOPHAGE) 500 MG tablet Take 1 tablet (500 mg total) by mouth 2 (two) times daily with a meal. (Patient taking differently: Take 500 mg by mouth in the morning.)  . nitroGLYCERIN (NITROSTAT) 0.4 MG SL tablet Place 1 tablet (0.4 mg total) under the tongue every 5 (five) minutes as needed for chest pain.  Glory Rosebush ULTRA test strip USE TO TEST BLOOD SUGAR ONCE OR TWICE A DAY (E11.22)  . oxybutynin (DITROPAN XL) 15 MG 24 hr tablet Take 1 tablet (15 mg total) by mouth at bedtime.  . rosuvastatin (CRESTOR) 20 MG tablet Take 20 mg by mouth every evening.  . tamsulosin (FLOMAX) 0.4 MG CAPS capsule Take 1 capsule (0.4 mg total) by mouth at bedtime.  Marland Kitchen telmisartan (MICARDIS) 40 MG tablet Take 1 tablet (40 mg total) by mouth daily.     Allergies:   Betadine [povidone iodine], Contrast media [iodinated diagnostic agents], Iodine, and Shellfish allergy   Social History   Socioeconomic History  . Marital status: Married    Spouse name: Not on  file  . Number of children: Not on file  . Years of education: Not on file  . Highest education level: Not on file  Occupational History  . Not on file  Tobacco Use  . Smoking status: Never Smoker  . Smokeless tobacco: Never Used  . Tobacco comment: occ alcohol  Vaping Use  . Vaping Use: Never used  Substance and Sexual Activity  . Alcohol use: Yes    Comment: OCCASIONAL  . Drug use: No  . Sexual activity: Not on file  Other Topics Concern  . Not on file  Social History Narrative   He is married, father of 2, grandfather of 21. Does not really get exercise now because   of his back issues. Does not smoke and drinks social alcohol. Works in [employer].   Social Determinants of Health   Financial Resource Strain: Not on file  Food Insecurity: Not on file  Transportation Needs: Not on file  Physical Activity: Not on file  Stress: Not on file  Social Connections: Not on file     Family History: The patient's family history includes Hypertension in his brother, father, and mother; Prostate cancer in his brother and father; Stroke in his mother.  ROS:   Please see the history of present illness.     All other systems reviewed and are negative.  EKGs/Labs/Other Studies Reviewed:    The following studies were reviewed today:  Coronary stent intervention 08/04/20:  Prox Cx lesion is 65% stenosed. Prox Cx to Mid Cx lesion is 30% stenosed. Mid Cx lesion is 75% stenosed. Non-stenotic Mid Cx to Dist Cx stent was previously treated.  A drug-eluting stent was successfully placed (covering all 3 lesions and crossing OM1, overlapping previous stent) using a SYNERGY XD 2.75X38. Deployed to 3.0 mm.  Post intervention, there is a 5% residual stenosis in the AV groove ostium. Otherwise 0% residual stenosis throughout including at the overlap site.  --------------------------  Prox LAD lesion is 40% stenosed.  Mid LAD lesion is 75% stenosed.  A drug-eluting stent was  successfully placed using a SYNERGY XD 2.50X20. Postdilated to 3.1 mm.  Post intervention, there is a 0% residual stenosis.  SUMMARY  Successful 2-vessel PCI:  Mid to distal  LCx lesions treated with a single Synergy DES 2.7 mm x 38 mm postdilated to 3.0 mm. (Overlaps previous stent, and crosses OM1 proximally)  Mid LAD lesion treated with Synergy DES 2.10mm x 20 mm (postdilated tapered from 3.1 to 2.8 mm)  Moderately elevated LVEDP of 19 mmHg.  RECOMMENDATIONS  8 HR post-cath hydration - but will give 1 dose Lasix 40 mg x 1 given LVEDP 19 mmHg ? Recheck BMP ~1600 - consider potential d/c in PM  F/u with me when he returns from his trip.    Left heart cath 08/03/20:   LV end diastolic pressure is mildly elevated, in the setting of systemic hypertension  ---------------------------  Prox RCA to Dist RCA lesion is 99% stenosed with 90% stenosed side branch in RV Branch. Dist RCA lesion is 100% stenosed.  Prox LAD lesion is 40% stenosed.  Mid LAD lesion is 75% stenosed.  Prox Cx lesion is 65% stenosed.  Prox Cx to Mid Cx lesion is 30% stenosed.  Mid Cx lesion is 75% stenosed.  Previously placed Mid Cx to Dist Cx stent (Cypher DES) is widely patent.  SUMMARY  Severe multivessel CAD: -->Proximal LAD 40% with severely diseased D1 followed by a large septal perforator (SP1 provides collaterals to RPDA), just after SP1 there is eccentric 75% stenosis in the LAD. Diffuse downstream disease in the LAD of 20 to 30% with a wraparound LAD providing collaterals to RPL branches. -->Focal 65 % LCx stenosis just after takeoff of OM1 followed by tapering 30% stenosis down to 75% just at the takeoff of the AV groove circumflex. The follow-on circumflex has a widely patent stent and gives rise to a large posterolateral OM 3 branch -->99% proximal RCA with trace flow to the distal proximal where it is not 100% occluded. Fills via collaterals from AVG LCx, SP1 and distal  LAD  Systemic hypertension with moderately elevated LVEDP   RECOMMENDATIONS  Technically, he has operative disease, but is not a good surgical candidate with his significant back issues. He would not likely recover well. He is also very very fearful of major surgeries. As such, I think PCI is the best option.  With renal insufficiency, we will admit under observation status tonight to allow for early morning hydration and first case two-vessel PCI of the LAD and LCx.  He will need to be repremedicated with prednisone.  We will hydrate him today, cover antihypertension with IV labetalol and hydralazine along with oral amlodipine while we are holding ARB and chlorthalidone. ? Plan will be to restart ARB and chlorthalidone 1 day post PCI.  Loaded with clopidogrel 300 mg tonight and 75 mg daily starting tomorrow.   We will give 325 mg of aspirin tomorrow morning, but would not discharge him on aspirin he will be on clopidogrel plus apixaban (which will be restarted tomorrow)  Continue statin.   EKG:  EKG is not ordered today.   Recent Labs: 08/05/2020: Hemoglobin 11.5; Platelets 180 08/11/2020: BUN 31; Creatinine, Ser 1.85; Potassium 4.3; Sodium 138  Recent Lipid Panel    Component Value Date/Time   CHOL 106 08/05/2020 0949   CHOL 145 10/02/2012 1002   TRIG 121 08/05/2020 0949   TRIG 157 (H) 10/02/2012 1002   HDL 38 (L) 08/05/2020 0949   HDL 36 (L) 10/02/2012 1002   CHOLHDL 2.8 08/05/2020 0949   VLDL 24 08/05/2020 0949   LDLCALC 44 08/05/2020 0949   LDLCALC 78 10/02/2012 1002    Physical Exam:    VS:  BP 110/80   Pulse (!) 54   Ht 5' 9.5" (1.765 m)   Wt 194 lb 9.6 oz (88.3 kg)   SpO2 98%   BMI 28.33 kg/m     Wt Readings from Last 3 Encounters:  08/26/20 194 lb 9.6 oz (88.3 kg)  08/06/20 203 lb 3.2 oz (92.2 kg)  08/05/20 201 lb 6.4 oz (91.4 kg)     GEN: elderly male, NAD, lots of jokes HEENT: Normal NECK: No JVD; No carotid bruits LYMPHATICS: No  lymphadenopathy CARDIAC: RRR, no murmurs, rubs, gallops RESPIRATORY:  Clear to auscultation without rales, wheezing or rhonchi  ABDOMEN: Soft, non-tender, non-distended MUSCULOSKELETAL:  No edema; No deformity  SKIN: Warm and dry NEUROLOGIC:  Alert and oriented x 3 PSYCHIATRIC:  Normal affect  Right radial cath site C/D/I  ASSESSMENT:    1. Coronary artery disease involving native coronary artery of native heart without angina pectoris   2. Medication management   3. Chronic kidney disease, unspecified CKD stage   4. Essential hypertension   5. PAF (paroxysmal atrial fibrillation) ([place])   6. CAD S/P percutaneous coronary angioplasty: Cypher DES x2 to circumflex   7. OSA on CPAP   8. Fatigue, unspecified type    PLAN:    In order of problems listed above:  CAD s/p PCI to Cx and LAD - remains on plavix, no ASA due to eliquis - no chest pain - no chest pain - on telmisartan, chlorthalidone, and coreg   Hypertension - pressure well controlled, no medication changes today   Fatigue - potential factors could be residual CAD vs CPAP settings vs BB vs age - wife states he is not fatigued when he has an agenda/things to do - starting PT soon - I will check CBC, CMP, TSH today - I hesitate to reduce BB yet, will check labs, CPAP and start PT --> if still fatigued at follow up, may consider reducing coreg to 6.25 mg BID   Hyperlipidemia with LDL goal < 70 08/05/2020: Cholesterol 106; HDL 38; LDL Cholesterol 44; Triglycerides 121; VLDL 24 - continue crestor   Paroxysmal atrial fibrillation Nocturnal sinus bradycardia - asymptomatic nocturnal bradycardia during hospitalization - HR here is in the 50s, no dizziness - fatigue has occurred for years, prior to BB, per patient/wife   Chronic anticoagulation - qualifies for lower dose of eliquis with age and renal function - on 2.5 mg eliquis BID - he falls due to numbness in his feet and mechanical falls, no bleeding  problems   OSA on CPAP - followed by Dr. Halford Chessman - given increased fatigue, will see if Dr. Halford Chessman can evaluate CPAP function    Medication Adjustments/Labs and Tests Ordered: Current medicines are reviewed at length with the patient today.  Concerns regarding medicines are outlined above.  Orders Placed This Encounter  Procedures  . CBC  . Comprehensive metabolic panel  . TSH   No orders of the defined types were placed in this encounter.   Signed, Ledora Bottcher, PA  08/26/2020 3:02 PM    [facility]

## 2020-08-26 ENCOUNTER — Other Ambulatory Visit: Payer: Self-pay

## 2020-08-26 ENCOUNTER — Encounter: Payer: Self-pay | Admitting: Physician Assistant

## 2020-08-26 ENCOUNTER — Ambulatory Visit (INDEPENDENT_AMBULATORY_CARE_PROVIDER_SITE_OTHER): Payer: Medicare Other | Admitting: Physician Assistant

## 2020-08-26 VITALS — BP 110/80 | HR 54 | Ht 69.5 in | Wt 194.6 lb

## 2020-08-26 DIAGNOSIS — R5383 Other fatigue: Secondary | ICD-10-CM

## 2020-08-26 DIAGNOSIS — I10 Essential (primary) hypertension: Secondary | ICD-10-CM | POA: Diagnosis not present

## 2020-08-26 DIAGNOSIS — Z9989 Dependence on other enabling machines and devices: Secondary | ICD-10-CM | POA: Diagnosis not present

## 2020-08-26 DIAGNOSIS — N189 Chronic kidney disease, unspecified: Secondary | ICD-10-CM

## 2020-08-26 DIAGNOSIS — Z79899 Other long term (current) drug therapy: Secondary | ICD-10-CM

## 2020-08-26 DIAGNOSIS — G4733 Obstructive sleep apnea (adult) (pediatric): Secondary | ICD-10-CM | POA: Diagnosis not present

## 2020-08-26 DIAGNOSIS — Z9861 Coronary angioplasty status: Secondary | ICD-10-CM | POA: Diagnosis not present

## 2020-08-26 DIAGNOSIS — I2510 Atherosclerotic heart disease of native coronary artery without angina pectoris: Secondary | ICD-10-CM | POA: Diagnosis not present

## 2020-08-26 DIAGNOSIS — I480 Paroxysmal atrial fibrillation: Secondary | ICD-10-CM

## 2020-08-26 NOTE — Telephone Encounter (Signed)
Patient was seen by extender on 08/26/20 for regular follow up .

## 2020-08-26 NOTE — Telephone Encounter (Signed)
I do think this is related to him not using his CPAP.  That is what CPAP is to treat-episodic breathing issues at night.  Glenetta Hew, MD

## 2020-08-26 NOTE — Patient Instructions (Signed)
Medication Instructions:  The current medical regimen is effective;  continue present plan and medications as directed. Please refer to the Current Medication list given to you today.  *If you need a refill on your cardiac medications before your next appointment, please call your pharmacy*  Lab Work:    CBC, CMP AND TSH TODAY  Follow-Up: Your next appointment:  KEEP SCHEDULED APPOINTMENT  In Person with Glenetta Hew, MD   At [facility], you and your health needs are our priority.  As part of our continuing mission to provide you with exceptional heart care, we have created designated Provider Care Teams.  These Care Teams include your primary Cardiologist (physician) and Advanced Practice Providers (APPs -  Physician Assistants and Nurse Practitioners) who all work together to provide you with the care you need, when you need it.

## 2020-08-27 LAB — COMPREHENSIVE METABOLIC PANEL
ALT: 12 IU/L (ref 0–44)
AST: 12 IU/L (ref 0–40)
Albumin/Globulin Ratio: 2.2 (ref 1.2–2.2)
Albumin: 3.8 g/dL (ref 3.6–4.6)
Alkaline Phosphatase: 56 IU/L (ref 44–121)
BUN/Creatinine Ratio: 16 (ref 10–24)
BUN: 28 mg/dL — ABNORMAL HIGH (ref 8–27)
Bilirubin Total: 0.3 mg/dL (ref 0.0–1.2)
CO2: 23 mmol/L (ref 20–29)
Calcium: 8.6 mg/dL (ref 8.6–10.2)
Chloride: 102 mmol/L (ref 96–106)
Creatinine, Ser: 1.74 mg/dL — ABNORMAL HIGH (ref 0.76–1.27)
Globulin, Total: 1.7 g/dL (ref 1.5–4.5)
Glucose: 109 mg/dL — ABNORMAL HIGH (ref 65–99)
Potassium: 4.3 mmol/L (ref 3.5–5.2)
Sodium: 140 mmol/L (ref 134–144)
Total Protein: 5.5 g/dL — ABNORMAL LOW (ref 6.0–8.5)
eGFR: 39 mL/min/{1.73_m2} — ABNORMAL LOW (ref >59)

## 2020-08-27 LAB — CBC
Hematocrit: 35.4 % — ABNORMAL LOW (ref 37.5–51.0)
Hemoglobin: 11.9 g/dL — ABNORMAL LOW (ref 13.0–17.7)
MCH: 28.7 pg (ref 26.6–33.0)
MCHC: 33.6 g/dL (ref 31.5–35.7)
MCV: 86 fL (ref 79–97)
Platelets: 249 10*3/uL (ref 150–450)
RBC: 4.14 x10E6/uL (ref 4.14–5.80)
RDW: 14.6 % (ref 11.6–15.4)
WBC: 9.4 10*3/uL (ref 3.4–10.8)

## 2020-08-27 LAB — TSH: TSH: 3.67 u[IU]/mL (ref 0.450–4.500)

## 2020-08-31 DIAGNOSIS — R269 Unspecified abnormalities of gait and mobility: Secondary | ICD-10-CM | POA: Diagnosis not present

## 2020-08-31 DIAGNOSIS — M6281 Muscle weakness (generalized): Secondary | ICD-10-CM | POA: Diagnosis not present

## 2020-09-03 DIAGNOSIS — I2510 Atherosclerotic heart disease of native coronary artery without angina pectoris: Secondary | ICD-10-CM | POA: Diagnosis not present

## 2020-09-03 DIAGNOSIS — I10 Essential (primary) hypertension: Secondary | ICD-10-CM | POA: Diagnosis not present

## 2020-09-03 DIAGNOSIS — F330 Major depressive disorder, recurrent, mild: Secondary | ICD-10-CM | POA: Diagnosis not present

## 2020-09-03 DIAGNOSIS — R269 Unspecified abnormalities of gait and mobility: Secondary | ICD-10-CM | POA: Diagnosis not present

## 2020-09-07 DIAGNOSIS — R269 Unspecified abnormalities of gait and mobility: Secondary | ICD-10-CM | POA: Diagnosis not present

## 2020-09-07 DIAGNOSIS — M6281 Muscle weakness (generalized): Secondary | ICD-10-CM | POA: Diagnosis not present

## 2020-09-14 DIAGNOSIS — M6281 Muscle weakness (generalized): Secondary | ICD-10-CM | POA: Diagnosis not present

## 2020-09-14 DIAGNOSIS — R269 Unspecified abnormalities of gait and mobility: Secondary | ICD-10-CM | POA: Diagnosis not present

## 2020-09-16 DIAGNOSIS — M6281 Muscle weakness (generalized): Secondary | ICD-10-CM | POA: Diagnosis not present

## 2020-09-16 DIAGNOSIS — R269 Unspecified abnormalities of gait and mobility: Secondary | ICD-10-CM | POA: Diagnosis not present

## 2020-09-21 DIAGNOSIS — R269 Unspecified abnormalities of gait and mobility: Secondary | ICD-10-CM | POA: Diagnosis not present

## 2020-09-21 DIAGNOSIS — M6281 Muscle weakness (generalized): Secondary | ICD-10-CM | POA: Diagnosis not present

## 2020-09-24 DIAGNOSIS — M6281 Muscle weakness (generalized): Secondary | ICD-10-CM | POA: Diagnosis not present

## 2020-09-24 DIAGNOSIS — F320 Major depressive disorder, single episode, mild: Secondary | ICD-10-CM | POA: Diagnosis not present

## 2020-09-24 DIAGNOSIS — R269 Unspecified abnormalities of gait and mobility: Secondary | ICD-10-CM | POA: Diagnosis not present

## 2020-09-28 ENCOUNTER — Ambulatory Visit (INDEPENDENT_AMBULATORY_CARE_PROVIDER_SITE_OTHER): Payer: Medicare Other | Admitting: Pulmonary Disease

## 2020-09-28 ENCOUNTER — Other Ambulatory Visit: Payer: Self-pay

## 2020-09-28 ENCOUNTER — Encounter: Payer: Self-pay | Admitting: Pulmonary Disease

## 2020-09-28 VITALS — BP 114/70 | HR 61 | Temp 97.9°F | Ht 69.0 in | Wt 188.0 lb

## 2020-09-28 DIAGNOSIS — G4733 Obstructive sleep apnea (adult) (pediatric): Secondary | ICD-10-CM | POA: Diagnosis not present

## 2020-09-28 DIAGNOSIS — Z9861 Coronary angioplasty status: Secondary | ICD-10-CM | POA: Diagnosis not present

## 2020-09-28 DIAGNOSIS — I2510 Atherosclerotic heart disease of native coronary artery without angina pectoris: Secondary | ICD-10-CM | POA: Diagnosis not present

## 2020-09-28 NOTE — Patient Instructions (Signed)
Follow up in 1 year.

## 2020-09-28 NOTE — Progress Notes (Signed)
[facility]  Chief Complaint  Patient presents with   Follow-up    2 month follow up---OSA with bipap.  Pt stated that he is doing well with the bipap.    Constitutional:  BP 114/70   Pulse 61   Temp 97.9 F (36.6 C) (Oral)   Ht 5\' 9"  (1.753 m)   Wt 188 lb (85.3 kg)   SpO2 99%   BMI 27.76 kg/m   Past Medical History:  CAD, OA, Colon polyps, Depression, DM type 2, HTN, Gout, Melanoma on forehead, Prostate cancer, HLD, PAF, Shingles  Past Surgical History:  He  has a past surgical history that includes [place] (118) (11-15-2002); EXCISION MELANOMA FROM FOREHEAD (2010); Coronary angioplasty with stent (09-01-2004  ); transthoracic echocardiogram (12/2017); Hydrocele surgery (10/14/2011); circumsision; Laminectomy (N/A, 07/13/2012); NM MYOVIEW LTD (3/212014; June 2016); Eye surgery (Bilateral); Tonsillectomy; Laminectomy (N/A, 12/09/2014); Skin biopsy; Cardiac Event Monitor (3-07/2015); NM MYOVIEW LTD (07/09/2020); LEFT HEART CATH AND CORONARY ANGIOGRAPHY (N/A, 08/03/2020); and CORONARY STENT INTERVENTION (N/A, 08/04/2020).  Brief Summary:  [name] is a 85 y.o. male with obstructive sleep apnea.      Subjective:   Doing well with Bipap.  Mask occasionally leaks.  Sleeping well.  Physical Exam:   Appearance - well kempt   ENMT - no sinus tenderness, no oral exudate, no LAN, Mallampati 3 airway, no stridor  Respiratory - equal breath sounds bilaterally, no wheezing or rales  CV - s1s2 regular rate and rhythm, no murmurs  Ext - no clubbing, no edema  Skin - no rashes  Psych - normal mood and affect   Sleep Tests:  PSG 01/19/19 >> AHI 41.1, SpO2 low 83% Bipap titration 03/08/19 >> Bipap 20/16 cm H2O  Auto Bipap 08/29/20 to 09/27/20 >> used on 27 of 30 nights with average 6 hrs 44 min.  Average AHI 5.1 with median Bipap 16/12 and 95 th percentile Bipap 17/13 cm H2O  Cardiac Tests:  Echo 12/12/17 >>  EF 55 to 60%, mod LVH  Social History:  He  reports that he has never smoked. He has never used smokeless tobacco. He reports current alcohol use. He reports that he does not use drugs.  Family History:  His family history includes Hypertension in his brother, father, and mother; Prostate cancer in his brother and father; Stroke in his mother.     Assessment/Plan:   Obstructive sleep apnea. - he is compliant with Bipap and reports benefit from therapy - he uses [facility] for his DME - continue auto Bipap with max IPAP 22, min EPAP 12, PS 4 cm H2O  CAD, PAF, Chronic diastolic CHF. - followed by Dr. Glenetta Hew with [facility]  Time Spent Involved in Patient Care on Day of Examination:  14 minutes  Follow up:   Patient Instructions  Follow up in 1 year  Medication List:   Allergies as of 09/28/2020       Reactions   Betadine [povidone Iodine] Anaphylaxis   Contrast Media [iodinated Diagnostic Agents] Anaphylaxis, Rash   Other Reaction: Other reaction   Iodine Anaphylaxis   Shellfish, IVP dye. Swelling in throat and foaming at mouth.   Shellfish Allergy Anaphylaxis        Medication List        Accurate as of September 28, 2020  2:07 PM. If you have any questions, ask your nurse or doctor.  allopurinol 300 MG tablet Commonly known as: ZYLOPRIM Take 1 tablet (300 mg total) by mouth daily.   carvedilol 6.25 MG tablet Commonly known as: COREG Take 12.5 mg by mouth 2 (two) times daily with a meal.   chlorthalidone 25 MG tablet Commonly known as: HYGROTON Take 12.5 mg by mouth in the morning.   clopidogrel 75 MG tablet Commonly known as: PLAVIX Take 1 tablet (75 mg total) by mouth daily.   diclofenac Sodium 1 % Gel Commonly known as: VOLTAREN Apply 2 g topically daily as needed (Pain).   diltiazem 240 MG 24 hr capsule Commonly known as: CARDIZEM CD Take 240 mg by mouth daily.   Eliquis 2.5 MG Tabs tablet Generic drug: apixaban Take  1 tablet (2.5 mg total) by mouth 2 (two) times daily.   lidocaine 5 % ointment Commonly known as: XYLOCAINE Apply 1 application topically daily as needed for mild pain or moderate pain.   Lidocaine 4 % Ptch Apply 1 patch topically daily as needed (Pain).   metFORMIN 500 MG tablet Commonly known as: GLUCOPHAGE Take 1 tablet (500 mg total) by mouth 2 (two) times daily with a meal. What changed: when to take this   nitroGLYCERIN 0.4 MG SL tablet Commonly known as: Nitrostat Place 1 tablet (0.4 mg total) under the tongue every 5 (five) minutes as needed for chest pain.   OneTouch Ultra test strip Generic drug: glucose blood USE TO TEST BLOOD SUGAR ONCE OR TWICE A DAY (E11.22)   oxybutynin 15 MG 24 hr tablet Commonly known as: DITROPAN XL Take 1 tablet (15 mg total) by mouth at bedtime.   rosuvastatin 20 MG tablet Commonly known as: CRESTOR Take 20 mg by mouth every evening.   sertraline 50 MG tablet Commonly known as: ZOLOFT Take 50 mg by mouth daily.   tamsulosin 0.4 MG Caps capsule Commonly known as: FLOMAX Take 1 capsule (0.4 mg total) by mouth at bedtime.   telmisartan 40 MG tablet Commonly known as: MICARDIS Take 1 tablet (40 mg total) by mouth daily.   vitamin D3 50 MCG (2000 UT) Caps Take 2,000 Units by mouth daily.        Signature:  Chesley Mires, MD [place] Pager - [phone] 09/28/2020, 2:07 PM

## 2020-09-30 ENCOUNTER — Telehealth (HOSPITAL_COMMUNITY): Payer: Self-pay

## 2020-09-30 NOTE — Telephone Encounter (Signed)
Called pt to see if he was interested in scheduling for cardiac rehab, pt stated that he is interested, I advised pt that the only time I had available to start is 7:15am class time. Pt stated, that was too early. I advised pt that I would call him when the August schedule is complete. Pt understood.

## 2020-10-01 DIAGNOSIS — N1832 Chronic kidney disease, stage 3b: Secondary | ICD-10-CM | POA: Diagnosis not present

## 2020-10-01 DIAGNOSIS — F325 Major depressive disorder, single episode, in full remission: Secondary | ICD-10-CM | POA: Diagnosis not present

## 2020-10-01 DIAGNOSIS — E1122 Type 2 diabetes mellitus with diabetic chronic kidney disease: Secondary | ICD-10-CM | POA: Diagnosis not present

## 2020-10-01 DIAGNOSIS — N182 Chronic kidney disease, stage 2 (mild): Secondary | ICD-10-CM | POA: Diagnosis not present

## 2020-10-01 DIAGNOSIS — E782 Mixed hyperlipidemia: Secondary | ICD-10-CM | POA: Diagnosis not present

## 2020-10-01 DIAGNOSIS — I480 Paroxysmal atrial fibrillation: Secondary | ICD-10-CM | POA: Diagnosis not present

## 2020-10-01 DIAGNOSIS — I10 Essential (primary) hypertension: Secondary | ICD-10-CM | POA: Diagnosis not present

## 2020-10-01 DIAGNOSIS — I2510 Atherosclerotic heart disease of native coronary artery without angina pectoris: Secondary | ICD-10-CM | POA: Diagnosis not present

## 2020-10-15 ENCOUNTER — Other Ambulatory Visit: Payer: Self-pay

## 2020-10-15 ENCOUNTER — Encounter (HOSPITAL_COMMUNITY)
Admission: RE | Admit: 2020-10-15 | Discharge: 2020-10-15 | Disposition: A | Discharge status: Home / Self Care | Payer: Medicare Other | Source: Ambulatory Visit | Attending: Cardiology | Admitting: Cardiology

## 2020-10-15 VITALS — BP 100/52 | HR 58 | Ht 68.4 in | Wt 187.2 lb

## 2020-10-15 DIAGNOSIS — Z48812 Encounter for surgical aftercare following surgery on the circulatory system: Secondary | ICD-10-CM | POA: Insufficient documentation

## 2020-10-15 DIAGNOSIS — Z7901 Long term (current) use of anticoagulants: Secondary | ICD-10-CM | POA: Insufficient documentation

## 2020-10-15 DIAGNOSIS — Z955 Presence of coronary angioplasty implant and graft: Secondary | ICD-10-CM

## 2020-10-15 DIAGNOSIS — Z79899 Other long term (current) drug therapy: Secondary | ICD-10-CM | POA: Diagnosis not present

## 2020-10-15 LAB — GLUCOSE, CAPILLARY: Glucose-Capillary: 106 mg/dL — ABNORMAL HIGH (ref 70–99)

## 2020-10-16 NOTE — Progress Notes (Signed)
Cardiac Individual Treatment Plan  Patient Details  Name: [name] MRN: [id number] Date of Birth: 1935-04-05 Referring Provider:   Flowsheet Row CARDIAC REHAB PHASE II ORIENTATION from 10/15/2020 in [place]  Referring Provider Dr. Glenetta Hew, MD       Initial Encounter Date:  Flowsheet Row CARDIAC REHAB PHASE II ORIENTATION from 10/15/2020 in [place]  Date 10/15/20       Visit Diagnosis: S/P drug eluting coronary stent placement  Patient's Home Medications on Admission:  Current Outpatient Medications:    allopurinol (ZYLOPRIM) 300 MG tablet, Take 1 tablet (300 mg total) by mouth daily., Disp: 30 tablet, Rfl: 1   apixaban (ELIQUIS) 2.5 MG TABS tablet, Take 1 tablet (2.5 mg total) by mouth 2 (two) times daily., Disp: 180 tablet, Rfl: 2   carvedilol (COREG) 6.25 MG tablet, Take 6.25 mg by mouth 2 (two) times daily with a meal., Disp: , Rfl:    chlorthalidone (HYGROTON) 25 MG tablet, Take 12.5 mg by mouth daily., Disp: , Rfl:    clopidogrel (PLAVIX) 75 MG tablet, Take 1 tablet (75 mg total) by mouth daily., Disp: 90 tablet, Rfl: 90   diltiazem (CARDIZEM CD) 240 MG 24 hr capsule, Take 240 mg by mouth daily., Disp: , Rfl:    escitalopram (LEXAPRO) 20 MG tablet, Take 20 mg by mouth daily., Disp: , Rfl:    metFORMIN (GLUCOPHAGE) 500 MG tablet, Take 1 tablet (500 mg total) by mouth 2 (two) times daily with a meal., Disp: , Rfl:    nitroGLYCERIN (NITROSTAT) 0.4 MG SL tablet, Place 1 tablet (0.4 mg total) under the tongue every 5 (five) minutes as needed for chest pain., Disp: 25 tablet, Rfl: 2   oxybutynin (DITROPAN XL) 15 MG 24 hr tablet, Take 1 tablet (15 mg total) by mouth at bedtime., Disp: 30 tablet, Rfl: 1   rosuvastatin (CRESTOR) 20 MG tablet, Take 20 mg by mouth every evening., Disp: , Rfl:    tamsulosin (FLOMAX) 0.4 MG CAPS capsule, Take 1 capsule (0.4 mg total) by mouth at bedtime., Disp: 30 capsule,  Rfl: 1   telmisartan (MICARDIS) 40 MG tablet, Take 1 tablet (40 mg total) by mouth daily., Disp: 90 tablet, Rfl: 3   Cholecalciferol (VITAMIN D3) 50 MCG (2000 UT) CAPS, Take 2,000 Units by mouth daily. (Patient not taking: Reported on 10/07/2020), Disp: , Rfl:    diclofenac Sodium (VOLTAREN) 1 % GEL, Apply 2 g topically daily as needed (Pain). (Patient not taking: Reported on 10/07/2020), Disp: , Rfl:    lidocaine (XYLOCAINE) 5 % ointment, Apply 1 application topically daily as needed for mild pain or moderate pain. (Patient not taking: Reported on 10/07/2020), Disp: , Rfl:    Lidocaine 4 % PTCH, Apply 1 patch topically daily as needed (Pain). (Patient not taking: Reported on 10/07/2020), Disp: , Rfl:    ONETOUCH ULTRA test strip, USE TO TEST BLOOD SUGAR ONCE OR TWICE A DAY (E11.22), Disp: , Rfl:    sertraline (ZOLOFT) 50 MG tablet, Take 50 mg by mouth daily. (Patient not taking: No sig reported), Disp: , Rfl:   Past Medical History: Past Medical History:  Diagnosis Date   Arthritis    CAD S/P percutaneous coronary angioplasty 08/2004   CARDIOLOGIST-  DR DAVID HARDING; DES to LCx. X2  - Cypher 2.5 mm postdilated to 2.75 mm (20 mm and 13 mm stents);; Myoview 09/2014: LOW RISK. No Infarct or Ischemia.   Colon polyps 05/10/2005  Hyperplastic polyps   Complication of anesthesia    "takes long time to wake up"   Depression    Diabetes mellitus type 2 with complications ([place])    neuropathy; CAD borderline- no med   Diastolic dysfunction without heart failure    Moderate LVH, Gr 1 DD on Echo 2011; no CHF admissions, no tt on diuretisc   Essential hypertension    H/O: gout STABLE   History of melanoma excision 2010   FOREHEAD   History of prostate cancer 2004--  S/P SEED IMPLANTS    NO RECURRENCE   Hydrocele, left    Hyperlipidemia LDL goal <70    Impaired hearing RIGHT HEARING AID   OSA on CPAP    bipap   PAF (paroxysmal atrial fibrillation) ([place]) 12/11/2017   Admitted with Afib RVR - converted  on IV Diltiazem. d/c on Eliquis; CHA2DS2Vasc = 5.   Prostate cancer [facility])    prostate , melanoma head   Shingles 07   Syncope and collapse March 2017   Likely related to post micturition, Neurontin and dehydration with orthostatic hypotension   Urgency of urination     Tobacco Use: Social History   Tobacco Use  Smoking Status Never  Smokeless Tobacco Never  Tobacco Comments   occ alcohol    Labs: Recent Review Flowsheet Data     Labs for ITP Cardiac and Pulmonary Rehab Latest Ref Rng & Units 10/02/2012 04/16/2013 12/01/2014 08/03/2020 08/05/2020   Cholestrol 0 - 200 mg/dL 145 112 - - 106   LDLCALC 0 - 99 mg/dL 78 52 - - 44   HDL >40 mg/dL 36(L) 31(L) - - 38(L)   Trlycerides <150 mg/dL 157(H) 143 - - 121   Hemoglobin A1c 4.8 - 5.6 % - - 6.9(H) 6.2(H) -       Capillary Blood Glucose: Lab Results  Component Value Date   GLUCAP 106 (H) 10/15/2020   GLUCAP 164 (H) 08/05/2020   GLUCAP 146 (H) 08/05/2020   GLUCAP 203 (H) 08/04/2020   GLUCAP 199 (H) 08/04/2020     Exercise Target Goals: Exercise Program Goal: Individual exercise prescription set using results from initial 6 min walk test and THRR while considering  patient's activity barriers and safety.   Exercise Prescription Goal: Initial exercise prescription builds to 30-45 minutes a day of aerobic activity, 2-3 days per week.  Home exercise guidelines will be given to patient during program as part of exercise prescription that the participant will acknowledge.  Activity Barriers & Risk Stratification:  Activity Barriers & Cardiac Risk Stratification - 10/15/20 1603       Activity Barriers & Cardiac Risk Stratification   Activity Barriers Neck/Spine Problems;Back Problems;Joint Problems;Balance Concerns;History of Falls;Assistive Device    Cardiac Risk Stratification High             6 Minute Walk:  6 Minute Walk     Row Name 10/15/20 1601         6 Minute Walk   Phase Initial  STEPPER TEST     Distance  1378 feet     Walk Time 6 minutes     # of Rest Breaks 0     MPH 2.61     METS 1.27     RPE 11     Perceived Dyspnea  0     VO2 Peak 4.45     Symptoms No     Resting HR 58 bpm     Resting BP 100/52  Resting Oxygen Saturation  98 %     Exercise Oxygen Saturation  during 6 min walk 98 %     Max Ex. HR 56 bpm     Max Ex. BP 130/52     2 Minute Post BP 120/52              Oxygen Initial Assessment:   Oxygen Re-Evaluation:   Oxygen Discharge (Final Oxygen Re-Evaluation):   Initial Exercise Prescription:  Initial Exercise Prescription - 10/15/20 1600       Date of Initial Exercise RX and Referring Provider   Date 10/15/20    Referring Provider Dr. Glenetta Hew, MD    Expected Discharge Date 12/11/20      NuStep   Level 1    SPM 70    Minutes 25    METs 1.4      Prescription Details   Frequency (times per week) 3    Duration Progress to 30 minutes of continuous aerobic without signs/symptoms of physical distress      Intensity   THRR 40-80% of Max Heartrate 55-110    Ratings of Perceived Exertion 11-13    Perceived Dyspnea 0-4      Progression   Progression Continue progressive overload as per policy without signs/symptoms or physical distress.      Resistance Training   Training Prescription Yes    Weight 2    Reps 10-15             Perform Capillary Blood Glucose checks as needed.  Exercise Prescription Changes:   Exercise Comments:   Exercise Goals and Review:   Exercise Goals     Row Name 10/15/20 1606             Exercise Goals   Increase Physical Activity Yes       Intervention Provide advice, education, support and counseling about physical activity/exercise needs.;Develop an individualized exercise prescription for aerobic and resistive training based on initial evaluation findings, risk stratification, comorbidities and participant's personal goals.       Expected Outcomes Short Term: Attend rehab on a regular basis to  increase amount of physical activity.;Long Term: Add in home exercise to make exercise part of routine and to increase amount of physical activity.;Long Term: Exercising regularly at least 3-5 days a week.       Increase Strength and Stamina Yes       Intervention Provide advice, education, support and counseling about physical activity/exercise needs.;Develop an individualized exercise prescription for aerobic and resistive training based on initial evaluation findings, risk stratification, comorbidities and participant's personal goals.       Expected Outcomes Short Term: Increase workloads from initial exercise prescription for resistance, speed, and METs.;Short Term: Perform resistance training exercises routinely during rehab and add in resistance training at home;Long Term: Improve cardiorespiratory fitness, muscular endurance and strength as measured by increased METs and functional capacity (6MWT)       Able to understand and use rate of perceived exertion (RPE) scale Yes       Intervention Provide education and explanation on how to use RPE scale       Expected Outcomes Short Term: Able to use RPE daily in rehab to express subjective intensity level;Long Term:  Able to use RPE to guide intensity level when exercising independently       Knowledge and understanding of Target Heart Rate Range (THRR) Yes       Intervention Provide education and explanation of THRR including how  the numbers were predicted and where they are located for reference       Expected Outcomes Short Term: Able to state/look up THRR;Long Term: Able to use THRR to govern intensity when exercising independently;Short Term: Able to use daily as guideline for intensity in rehab       Understanding of Exercise Prescription Yes       Intervention Provide education, explanation, and written materials on patient's individual exercise prescription       Expected Outcomes Short Term: Able to explain program exercise prescription;Long  Term: Able to explain home exercise prescription to exercise independently                Exercise Goals Re-Evaluation :   Discharge Exercise Prescription (Final Exercise Prescription Changes):   Nutrition:  Target Goals: Understanding of nutrition guidelines, daily intake of sodium 1500mg , cholesterol 200mg , calories 30% from fat and 7% or less from saturated fats, daily to have 5 or more servings of fruits and vegetables.  Biometrics:  Pre Biometrics - 10/15/20 1558       Pre Biometrics   Waist Circumference 40.25 inches    Hip Circumference 41 inches    Waist to Hip Ratio 0.98 %    Triceps Skinfold 21 mm    % Body Fat 35.5 %    Grip Strength 39 kg    Flexibility --   Not done. Spine injury   Single Leg Stand --   Not done. Spine injury             Nutrition Therapy Plan and Nutrition Goals:   Nutrition Assessments:  MEDIFICTS Score Key: ?70 Need to make dietary changes  40-70 Heart Healthy Diet ? 40 Therapeutic Level Cholesterol Diet    Picture Your Plate Scores: <53 Unhealthy dietary pattern with much room for improvement. 41-50 Dietary pattern unlikely to meet recommendations for good health and room for improvement. 51-60 More healthful dietary pattern, with some room for improvement.  >60 Healthy dietary pattern, although there may be some specific behaviors that could be improved.    Nutrition Goals Re-Evaluation:   Nutrition Goals Re-Evaluation:   Nutrition Goals Discharge (Final Nutrition Goals Re-Evaluation):   Psychosocial: Target Goals: Acknowledge presence or absence of significant depression and/or stress, maximize coping skills, provide positive support system. Participant is able to verbalize types and ability to use techniques and skills needed for reducing stress and depression.  Initial Review & Psychosocial Screening:  Initial Psych Review & Screening - 10/16/20 1108       Initial Review   Current issues with Current  Depression;Current Sleep Concerns      Family Dynamics   Good Support System? Yes   supportive wife   Comments Shared feels down each day but knows to press on and do things. He no longer plays golf and not able to be as active as he used to be so this impacts him. However he does continue to have lunch with friends and he and his wife strive to continue to engage with family for outings. He shared he does not have the appetite he has had in prior years as given his diet had an impact on his health being that he no longer eats red meat and drinks alcohol due to a severe case of gout in his past. He reports being put on a new medication for depression a couple weeks ago but did not have that current medication on his list but he said he will get it to  [place] when he starts next week.      Barriers   Psychosocial barriers to participate in program There are no identifiable barriers or psychosocial needs.   He said he does not foresee issues in participation. His wife supports him participating in the program.     Screening Interventions   Interventions Encouraged to exercise;To provide support and resources with identified psychosocial needs;Other (comment)    Comments Check with [place] to ensure he has found a Social worker. He shared Dr. Laurann Montana provided a list and Aayush said he is currently in the process of calls.    Expected Outcomes Short Term goal: Identification and review with participant of any Quality of Life or Depression concerns found by scoring the questionnaire.;Long Term goal: The participant improves quality of Life and PHQ9 Scores as seen by post scores and/or verbalization of changes             Quality of Life Scores:  Scores of 19 and below usually indicate a poorer quality of life in these areas.  A difference of  2-3 points is a clinically meaningful difference.  A difference of 2-3 points in the total score of the Quality of Life Index has been associated with significant  improvement in overall quality of life, self-image, physical symptoms, and general health in studies assessing change in quality of life.  PHQ-9: Recent Review Flowsheet Data     Depression screen [facility] 2/9 10/15/2020 02/03/2015 08/18/2013   Decreased Interest 0 0 0   Down, Depressed, Hopeless 3  1 0   PHQ - 2 Score 3 1 0   Altered sleeping 3 - -   Tired, decreased energy 3 - -   Change in appetite 3 - -   Feeling bad or failure about yourself  0 - -   Trouble concentrating 0 - -   Moving slowly or fidgety/restless 0 - -   Suicidal thoughts (No Data)  - -   PHQ-9 Score 12 - -   Difficult doing work/chores Not difficult at all - -      Interpretation of Total Score  Total Score Depression Severity:  1-4 = Minimal depression, 5-9 = Mild depression, 10-14 = Moderate depression, 15-19 = Moderately severe depression, 20-27 = Severe depression   Psychosocial Evaluation and Intervention:   Psychosocial Re-Evaluation:   Psychosocial Discharge (Final Psychosocial Re-Evaluation):   Vocational Rehabilitation: Provide vocational rehab assistance to qualifying candidates.   Vocational Rehab Evaluation & Intervention:   Education: Education Goals: Education classes will be provided on a weekly basis, covering required topics. Participant will state understanding/return demonstration of topics presented.  Learning Barriers/Preferences:  Learning Barriers/Preferences - 10/15/20 1608       Learning Barriers/Preferences   Learning Barriers Hearing    Learning Preferences Audio;Group Instruction;Skilled Demonstration;Verbal Instruction;Video             Education Topics: Count Your Pulse:  -Group instruction provided by verbal instruction, demonstration, patient participation and written materials to support subject.  Instructors address importance of being able to find your pulse and how to count your pulse when at home without a heart monitor.  Patients get hands on experience  counting their pulse with staff help and individually.   Heart Attack, Angina, and Risk Factor Modification:  -Group instruction provided by verbal instruction, video, and written materials to support subject.  Instructors address signs and symptoms of angina and heart attacks.    Also discuss risk factors for heart disease and how to make changes  to improve heart health risk factors.   Functional Fitness:  -Group instruction provided by verbal instruction, demonstration, patient participation, and written materials to support subject.  Instructors address safety measures for doing things around the house.  Discuss how to get up and down off the floor, how to pick things up properly, how to safely get out of a chair without assistance, and balance training.   Meditation and Mindfulness:  -Group instruction provided by verbal instruction, patient participation, and written materials to support subject.  Instructor addresses importance of mindfulness and meditation practice to help reduce stress and improve awareness.  Instructor also leads participants through a meditation exercise.    Stretching for Flexibility and Mobility:  -Group instruction provided by verbal instruction, patient participation, and written materials to support subject.  Instructors lead participants through series of stretches that are designed to increase flexibility thus improving mobility.  These stretches are additional exercise for major muscle groups that are typically performed during regular warm up and cool down.   Hands Only CPR:  -Group verbal, video, and participation provides a basic overview of AHA guidelines for community CPR. Role-play of emergencies allow participants the opportunity to practice calling for help and chest compression technique with discussion of AED use.   Hypertension: -Group verbal and written instruction that provides a basic overview of hypertension including the most recent  diagnostic guidelines, risk factor reduction with self-care instructions and medication management.    Nutrition I class: Heart Healthy Eating:  -Group instruction provided by PowerPoint slides, verbal discussion, and written materials to support subject matter. The instructor gives an explanation and review of the Therapeutic Lifestyle Changes diet recommendations, which includes a discussion on lipid goals, dietary fat, sodium, fiber, plant stanol/sterol esters, sugar, and the components of a well-balanced, healthy diet.   Nutrition II class: Lifestyle Skills:  -Group instruction provided by PowerPoint slides, verbal discussion, and written materials to support subject matter. The instructor gives an explanation and review of label reading, grocery shopping for heart health, heart healthy recipe modifications, and ways to make healthier choices when eating out.   Diabetes Question & Answer:  -Group instruction provided by PowerPoint slides, verbal discussion, and written materials to support subject matter. The instructor gives an explanation and review of diabetes co-morbidities, pre- and post-prandial blood glucose goals, pre-exercise blood glucose goals, signs, symptoms, and treatment of hypoglycemia and hyperglycemia, and foot care basics.   Diabetes Blitz:  -Group instruction provided by PowerPoint slides, verbal discussion, and written materials to support subject matter. The instructor gives an explanation and review of the physiology behind type 1 and type 2 diabetes, diabetes medications and rational behind using different medications, pre- and post-prandial blood glucose recommendations and Hemoglobin A1c goals, diabetes diet, and exercise including blood glucose guidelines for exercising safely.    Portion Distortion:  -Group instruction provided by PowerPoint slides, verbal discussion, written materials, and food models to support subject matter. The instructor gives an explanation  of serving size versus portion size, changes in portions sizes over the last 20 years, and what consists of a serving from each food group.   Stress Management:  -Group instruction provided by verbal instruction, video, and written materials to support subject matter.  Instructors review role of stress in heart disease and how to cope with stress positively.     Exercising on Your Own:  -Group instruction provided by verbal instruction, power point, and written materials to support subject.  Instructors discuss benefits of exercise, components of exercise,  frequency and intensity of exercise, and end points for exercise.  Also discuss use of nitroglycerin and activating EMS.  Review options of places to exercise outside of rehab.  Review guidelines for sex with heart disease.   Cardiac Drugs I:  -Group instruction provided by verbal instruction and written materials to support subject.  Instructor reviews cardiac drug classes: antiplatelets, anticoagulants, beta blockers, and statins.  Instructor discusses reasons, side effects, and lifestyle considerations for each drug class.   Cardiac Drugs II:  -Group instruction provided by verbal instruction and written materials to support subject.  Instructor reviews cardiac drug classes: angiotensin converting enzyme inhibitors (ACE-I), angiotensin II receptor blockers (ARBs), nitrates, and calcium channel blockers.  Instructor discusses reasons, side effects, and lifestyle considerations for each drug class.   Anatomy and Physiology of the Circulatory System:  Group verbal and written instruction and models provide basic cardiac anatomy and physiology, with the coronary electrical and arterial systems. Review of: AMI, Angina, Valve disease, Heart Failure, Peripheral Artery Disease, Cardiac Arrhythmia, Pacemakers, and the ICD.   Other Education:  -Group or individual verbal, written, or video instructions that support the educational goals of the  cardiac rehab program.   Holiday Eating Survival Tips:  -Group instruction provided by PowerPoint slides, verbal discussion, and written materials to support subject matter. The instructor gives patients tips, tricks, and techniques to help them not only survive but enjoy the holidays despite the onslaught of food that accompanies the holidays.   Knowledge Questionnaire Score:   Core Components/Risk Factors/Patient Goals at Admission:  Personal Goals and Risk Factors at Admission - 10/15/20 1609       Core Components/Risk Factors/Patient Goals on Admission    Weight Management Yes;Weight Loss    Intervention Weight Management: Develop a combined nutrition and exercise program designed to reach desired caloric intake, while maintaining appropriate intake of nutrient and fiber, sodium and fats, and appropriate energy expenditure required for the weight goal.;Weight Management: Provide education and appropriate resources to help participant work on and attain dietary goals.;Weight Management/Obesity: Establish reasonable short term and long term weight goals.    Expected Outcomes Short Term: Continue to assess and modify interventions until short term weight is achieved;Long Term: Adherence to nutrition and physical activity/exercise program aimed toward attainment of established weight goal;Understanding recommendations for meals to include 15-35% energy as protein, 25-35% energy from fat, 35-60% energy from carbohydrates, less than 200mg  of dietary cholesterol, 20-35 gm of total fiber daily;Understanding of distribution of calorie intake throughout the day with the consumption of 4-5 meals/snacks    Diabetes Yes    Intervention Provide education about signs/symptoms and action to take for hypo/hyperglycemia.;Provide education about proper nutrition, including hydration, and aerobic/resistive exercise prescription along with prescribed medications to achieve blood glucose in normal ranges: Fasting  glucose 65-99 mg/dL    Expected Outcomes Short Term: Participant verbalizes understanding of the signs/symptoms and immediate care of hyper/hypoglycemia, proper foot care and importance of medication, aerobic/resistive exercise and nutrition plan for blood glucose control.;Long Term: Attainment of HbA1C < 7%.    Hypertension Yes    Intervention Provide education on lifestyle modifcations including regular physical activity/exercise, weight management, moderate sodium restriction and increased consumption of fresh fruit, vegetables, and low fat dairy, alcohol moderation, and smoking cessation.;Monitor prescription use compliance.    Expected Outcomes Short Term: Continued assessment and intervention until BP is < 140/17mm HG in hypertensive participants. < 130/84mm HG in hypertensive participants with diabetes, heart failure or chronic kidney disease.;Long Term: Maintenance of  blood pressure at goal levels.    Lipids Yes    Intervention Provide education and support for participant on nutrition & aerobic/resistive exercise along with prescribed medications to achieve LDL 70mg , HDL >40mg .    Expected Outcomes Short Term: Participant states understanding of desired cholesterol values and is compliant with medications prescribed. Participant is following exercise prescription and nutrition guidelines.;Long Term: Cholesterol controlled with medications as prescribed, with individualized exercise RX and with personalized nutrition plan. Value goals: LDL < 70mg , HDL > 40 mg.    Personal Goal Other Yes    Personal Goal Pt wants to gain stregth and balance    Intervention Will continue to monitor pt and progress workloads as tolerated without sign or symptom.    Expected Outcomes Pt will achieve his goals             Core Components/Risk Factors/Patient Goals Review:    Core Components/Risk Factors/Patient Goals at Discharge (Final Review):    ITP Comments:   Comments: Patient attended  orientation on 10/16/2020 to review rules and guidelines for program.  Completed 6 minute walk test, Intitial ITP, and exercise prescription.  VSS. Telemetry-Sinus Brady at rest, Normal Sinus Rhythm with occasional PVCs during stepper test.  Asymptomatic. He was not able to complete all intake forms during the timeframe with [place] but will return the forms his first session. Safety measures and social distancing in place per CDC guidelines.

## 2020-10-19 ENCOUNTER — Other Ambulatory Visit: Payer: Self-pay

## 2020-10-19 ENCOUNTER — Encounter (HOSPITAL_COMMUNITY)
Admission: RE | Admit: 2020-10-19 | Discharge: 2020-10-19 | Disposition: A | Discharge status: Home / Self Care | Payer: Medicare Other | Source: Ambulatory Visit | Attending: Cardiology | Admitting: Cardiology

## 2020-10-19 DIAGNOSIS — Z7901 Long term (current) use of anticoagulants: Secondary | ICD-10-CM | POA: Diagnosis not present

## 2020-10-19 DIAGNOSIS — Z79899 Other long term (current) drug therapy: Secondary | ICD-10-CM | POA: Diagnosis not present

## 2020-10-19 DIAGNOSIS — Z955 Presence of coronary angioplasty implant and graft: Secondary | ICD-10-CM

## 2020-10-19 DIAGNOSIS — Z48812 Encounter for surgical aftercare following surgery on the circulatory system: Secondary | ICD-10-CM | POA: Diagnosis not present

## 2020-10-19 LAB — GLUCOSE, CAPILLARY
Glucose-Capillary: 98 mg/dL (ref 70–99)
Glucose-Capillary: 91 mg/dL (ref 70–99)

## 2020-10-19 NOTE — Progress Notes (Signed)
Cardiac Rehab Medication Review   Does the patient  feel that his/her medications are working for him/her?  YES   Has the patient been experiencing any side effects to the medications prescribed?  NO, not currently  Does the patient measure his/her own blood pressure or blood glucose at home?  YES   Does the patient have any problems obtaining medications due to transportation or finances?    NO  Understanding of regimen: good Understanding of indications: excellent Potential of compliance: good    Comments: Patient brought medication bottles in today to review along with the reconciled list by Lyndel Safe CPhT completed on 10/07/20 at 1:08pm.  Two medications patient reports taking differently from prescription on bottle.  He reduced the carvedilol 6.25mg  from taking twice daily total of 12.5mg  to now taking 6.25mg  once daily. He said he made the change on his own because he did not like how he felt and he has not reported that change to his prescribing Physician.  He is taking metformin 500mg  1 tablet once in the morning and no longer taking two times daily.  His wife helps organize his medications and maintains his list of medications.     [name] 10/19/2020 4:07 PM

## 2020-10-19 NOTE — Progress Notes (Signed)
Daily Session Note  Patient Details  Name: [name] MRN: [id number] Date of Birth: 1935/10/23 Referring Provider:   Flowsheet Row CARDIAC REHAB PHASE II ORIENTATION from 10/15/2020 in [place]  Referring Provider Dr. Glenetta Hew, MD       Encounter Date: 10/19/2020  Check In:  Session Check In - 10/19/20 1534       Check-In   Supervising physician immediately available to respond to emergencies See telemetry face sheet for immediately available ER MD    Location [facility]    Staff Present Lesly Rubenstein, MS, ACSM-CEP, CCRP, Exercise Physiologist;Jetta Gilford Rile BS, ACSM EP-C, Exercise Physiologist;Other;Olinty Celesta Aver, MS, ACSM CEP, Exercise Physiologist;Jessica Hassell Done, MS, ACSM-CEP, Exercise Physiologist;Jamecia Lerman Wilber Oliphant, RN, BSN    Virtual Visit No    Medication changes reported     No    Fall or balance concerns reported    No    Tobacco Cessation No Change    Warm-up and Cool-down Performed on first and last piece of equipment    Resistance Training Performed Yes    VAD Patient? No    PAD/SET Patient? No      Pain Assessment   Currently in Pain? No/denies    Pain Score 0-No pain    Multiple Pain Sites No             Capillary Blood Glucose: Results for orders placed or performed during the hospital encounter of 10/19/20 (from the past 24 hour(s))  Glucose, capillary     Status: None   Collection Time: 10/19/20  3:27 PM  Result Value Ref Range   Glucose-Capillary 98 70 - 99 mg/dL   Comment 1 Notify RN   Glucose, capillary     Status: None   Collection Time: 10/19/20  4:08 PM  Result Value Ref Range   Glucose-Capillary 91 70 - 99 mg/dL     Exercise Prescription Changes - 10/19/20 1643       Response to Exercise   Blood Pressure (Admit) 118/60    Blood Pressure (Exercise) 118/56    Blood Pressure (Exit) 102/50    Heart Rate (Admit) 73 bpm    Heart Rate (Exercise) 79 bpm    Heart Rate (Exit) 63  bpm    Rating of Perceived Exertion (Exercise) 10    Symptoms none    Comments Pt first day of exercise    Duration Progress to 30 minutes of  aerobic without signs/symptoms of physical distress    Intensity THRR unchanged      Progression   Progression Continue to progress workloads to maintain intensity without signs/symptoms of physical distress.    Average METs 1.7      Resistance Training   Training Prescription Yes    Weight 2    Reps 10-15    Time 10 Minutes      NuStep   Level 1    SPM 70    Minutes 25    METs 1.7             Social History   Tobacco Use  Smoking Status Never  Smokeless Tobacco Never  Tobacco Comments   occ alcohol    Goals Met:  Exercise tolerated well No report of cardiac concerns or symptoms Strength training completed today  Goals Unmet:  Not Applicable  Comments: Pt started cardiac rehab today.  Pt tolerated light exercise without difficulty. VSS, telemetry-SR, asymptomatic.  Medication list reconciled based upon medications brought in by the  patients. Pt denies barriers to medication compliance.  PSYCHOSOCIAL ASSESSMENT:  PHQ-12. Braxtyn scores correlated with his abilities which are altered by physical constraints which limits ability to perform as prior to recent cardiac illness. Laurel feels he has a supportive wife and family. Zaydn is quite limited with his mobility and safety even though he continues to drive independently.  Pt advised to use the main entrance of the hospital as his entry as he can not safely negotiate the stairs coming out of parking garage.  Simuel returned his pre assessment forms.  Will review his QOL once it has been scored.   Pt enjoys gambling, going on cruises/travel and coins.   Pt oriented to exercise equipment and routine.  Understanding verbalized. Maurice Small RN, BSN Cardiac and Pulmonary Rehab Nurse Navigator      Dr. Fransico Him is Medical Director for Cardiac Rehab at [facility].

## 2020-10-19 NOTE — Progress Notes (Signed)
Cardiac Individual Treatment Plan  Patient Details  Name: [name] MRN: [id number] Date of Birth: Jul 17, 1935 Referring Provider:   Flowsheet Row CARDIAC REHAB PHASE II ORIENTATION from 10/15/2020 in [place]  Referring Provider Dr. Glenetta Hew, MD       Initial Encounter Date:  Flowsheet Row CARDIAC REHAB PHASE II ORIENTATION from 10/15/2020 in [place]  Date 10/15/20       Visit Diagnosis: S/P drug eluting coronary stent placement  Patient's Home Medications on Admission:  Current Outpatient Medications:    allopurinol (ZYLOPRIM) 300 MG tablet, Take 1 tablet (300 mg total) by mouth daily., Disp: 30 tablet, Rfl: 1   apixaban (ELIQUIS) 2.5 MG TABS tablet, Take 1 tablet (2.5 mg total) by mouth 2 (two) times daily., Disp: 180 tablet, Rfl: 2   carvedilol (COREG) 6.25 MG tablet, Take 6.25 mg by mouth 2 (two) times daily with a meal., Disp: , Rfl:    chlorthalidone (HYGROTON) 25 MG tablet, Take 12.5 mg by mouth daily., Disp: , Rfl:    Cholecalciferol (VITAMIN D3) 50 MCG (2000 UT) CAPS, Take 2,000 Units by mouth daily. (Patient not taking: Reported on 10/07/2020), Disp: , Rfl:    clopidogrel (PLAVIX) 75 MG tablet, Take 1 tablet (75 mg total) by mouth daily., Disp: 90 tablet, Rfl: 90   diclofenac Sodium (VOLTAREN) 1 % GEL, Apply 2 g topically daily as needed (Pain). (Patient not taking: Reported on 10/07/2020), Disp: , Rfl:    diltiazem (CARDIZEM CD) 240 MG 24 hr capsule, Take 240 mg by mouth daily., Disp: , Rfl:    escitalopram (LEXAPRO) 20 MG tablet, Take 20 mg by mouth daily., Disp: , Rfl:    lidocaine (XYLOCAINE) 5 % ointment, Apply 1 application topically daily as needed for mild pain or moderate pain. (Patient not taking: Reported on 10/07/2020), Disp: , Rfl:    Lidocaine 4 % PTCH, Apply 1 patch topically daily as needed (Pain). (Patient not taking: Reported on 10/07/2020), Disp: , Rfl:    metFORMIN (GLUCOPHAGE) 500  MG tablet, Take 1 tablet (500 mg total) by mouth 2 (two) times daily with a meal., Disp: , Rfl:    nitroGLYCERIN (NITROSTAT) 0.4 MG SL tablet, Place 1 tablet (0.4 mg total) under the tongue every 5 (five) minutes as needed for chest pain., Disp: 25 tablet, Rfl: 2   ONETOUCH ULTRA test strip, USE TO TEST BLOOD SUGAR ONCE OR TWICE A DAY (E11.22), Disp: , Rfl:    oxybutynin (DITROPAN XL) 15 MG 24 hr tablet, Take 1 tablet (15 mg total) by mouth at bedtime., Disp: 30 tablet, Rfl: 1   rosuvastatin (CRESTOR) 20 MG tablet, Take 20 mg by mouth every evening., Disp: , Rfl:    sertraline (ZOLOFT) 50 MG tablet, Take 50 mg by mouth daily. (Patient not taking: No sig reported), Disp: , Rfl:    tamsulosin (FLOMAX) 0.4 MG CAPS capsule, Take 1 capsule (0.4 mg total) by mouth at bedtime., Disp: 30 capsule, Rfl: 1   telmisartan (MICARDIS) 40 MG tablet, Take 1 tablet (40 mg total) by mouth daily., Disp: 90 tablet, Rfl: 3  Past Medical History: Past Medical History:  Diagnosis Date   Arthritis    CAD S/P percutaneous coronary angioplasty 08/2004   CARDIOLOGIST-  DR DAVID HARDING; DES to LCx. X2  - Cypher 2.5 mm postdilated to 2.75 mm (20 mm and 13 mm stents);; Myoview 09/2014: LOW RISK. No Infarct or Ischemia.   Colon polyps 05/10/2005  Hyperplastic polyps   Complication of anesthesia    "takes long time to wake up"   Depression    Diabetes mellitus type 2 with complications ([place])    neuropathy; CAD borderline- no med   Diastolic dysfunction without heart failure    Moderate LVH, Gr 1 DD on Echo 2011; no CHF admissions, no tt on diuretisc   Essential hypertension    H/O: gout STABLE   History of melanoma excision 2010   FOREHEAD   History of prostate cancer 2004--  S/P SEED IMPLANTS    NO RECURRENCE   Hydrocele, left    Hyperlipidemia LDL goal <70    Impaired hearing RIGHT HEARING AID   OSA on CPAP    bipap   PAF (paroxysmal atrial fibrillation) ([place]) 12/11/2017   Admitted with Afib RVR - converted on  IV Diltiazem. d/c on Eliquis; CHA2DS2Vasc = 5.   Prostate cancer [facility])    prostate , melanoma head   Shingles 07   Syncope and collapse March 2017   Likely related to post micturition, Neurontin and dehydration with orthostatic hypotension   Urgency of urination     Tobacco Use: Social History   Tobacco Use  Smoking Status Never  Smokeless Tobacco Never  Tobacco Comments   occ alcohol    Labs: Recent Review Flowsheet Data     Labs for ITP Cardiac and Pulmonary Rehab Latest Ref Rng & Units 10/02/2012 04/16/2013 12/01/2014 08/03/2020 08/05/2020   Cholestrol 0 - 200 mg/dL 145 112 - - 106   LDLCALC 0 - 99 mg/dL 78 52 - - 44   HDL >40 mg/dL 36(L) 31(L) - - 38(L)   Trlycerides <150 mg/dL 157(H) 143 - - 121   Hemoglobin A1c 4.8 - 5.6 % - - 6.9(H) 6.2(H) -       Capillary Blood Glucose: Lab Results  Component Value Date   GLUCAP 92 10/21/2020   GLUCAP 91 10/19/2020   GLUCAP 98 10/19/2020   GLUCAP 106 (H) 10/15/2020   GLUCAP 164 (H) 08/05/2020     Exercise Target Goals: Exercise Program Goal: Individual exercise prescription set using results from initial 6 min walk test and THRR while considering  patient's activity barriers and safety.   Exercise Prescription Goal: Initial exercise prescription builds to 30-45 minutes a day of aerobic activity, 2-3 days per week.  Home exercise guidelines will be given to patient during program as part of exercise prescription that the participant will acknowledge.  Activity Barriers & Risk Stratification:  Activity Barriers & Cardiac Risk Stratification - 10/15/20 1603       Activity Barriers & Cardiac Risk Stratification   Activity Barriers Neck/Spine Problems;Back Problems;Joint Problems;Balance Concerns;History of Falls;Assistive Device    Cardiac Risk Stratification High             6 Minute Walk:  6 Minute Walk     Row Name 10/15/20 1601         6 Minute Walk   Phase Initial  STEPPER TEST     Distance 1378 feet      Walk Time 6 minutes     # of Rest Breaks 0     MPH 2.61     METS 1.27     RPE 11     Perceived Dyspnea  0     VO2 Peak 4.45     Symptoms No     Resting HR 58 bpm     Resting BP 100/52     Resting Oxygen Saturation  98 %     Exercise Oxygen Saturation  during 6 min walk 98 %     Max Ex. HR 56 bpm     Max Ex. BP 130/52     2 Minute Post BP 120/52              Oxygen Initial Assessment:   Oxygen Re-Evaluation:   Oxygen Discharge (Final Oxygen Re-Evaluation):   Initial Exercise Prescription:  Initial Exercise Prescription - 10/15/20 1600       Date of Initial Exercise RX and Referring Provider   Date 10/15/20    Referring Provider Dr. Glenetta Hew, MD    Expected Discharge Date 12/11/20      NuStep   Level 1    SPM 70    Minutes 25    METs 1.4      Prescription Details   Frequency (times per week) 3    Duration Progress to 30 minutes of continuous aerobic without signs/symptoms of physical distress      Intensity   THRR 40-80% of Max Heartrate 55-110    Ratings of Perceived Exertion 11-13    Perceived Dyspnea 0-4      Progression   Progression Continue progressive overload as per policy without signs/symptoms or physical distress.      Resistance Training   Training Prescription Yes    Weight 2    Reps 10-15             Perform Capillary Blood Glucose checks as needed.  Exercise Prescription Changes:   Exercise Prescription Changes     Row Name 10/19/20 1643             Response to Exercise   Blood Pressure (Admit) 118/60       Blood Pressure (Exercise) 118/56       Blood Pressure (Exit) 102/50       Heart Rate (Admit) 73 bpm       Heart Rate (Exercise) 79 bpm       Heart Rate (Exit) 63 bpm       Rating of Perceived Exertion (Exercise) 10       Symptoms none       Comments Pt first day of exercise       Duration Progress to 30 minutes of  aerobic without signs/symptoms of physical distress       Intensity THRR unchanged                Progression     Progression Continue to progress workloads to maintain intensity without signs/symptoms of physical distress.       Average METs 1.7               Resistance Training     Training Prescription Yes       Weight 2       Reps 10-15       Time 10 Minutes               NuStep     Level 1       SPM 70       Minutes 25       METs 1.7               Exercise Comments:   Exercise Comments     Row Name 10/19/20 1647           Exercise Comments Pt first day in the CRP2 program. Pt tolerated exercise well without sign or symptom  Exercise Goals and Review:   Exercise Goals     Row Name 10/15/20 1606             Exercise Goals   Increase Physical Activity Yes       Intervention Provide advice, education, support and counseling about physical activity/exercise needs.;Develop an individualized exercise prescription for aerobic and resistive training based on initial evaluation findings, risk stratification, comorbidities and participant's personal goals.       Expected Outcomes Short Term: Attend rehab on a regular basis to increase amount of physical activity.;Long Term: Add in home exercise to make exercise part of routine and to increase amount of physical activity.;Long Term: Exercising regularly at least 3-5 days a week.       Increase Strength and Stamina Yes       Intervention Provide advice, education, support and counseling about physical activity/exercise needs.;Develop an individualized exercise prescription for aerobic and resistive training based on initial evaluation findings, risk stratification, comorbidities and participant's personal goals.       Expected Outcomes Short Term: Increase workloads from initial exercise prescription for resistance, speed, and METs.;Short Term: Perform resistance training exercises routinely during rehab and add in resistance training at home;Long Term: Improve cardiorespiratory fitness,  muscular endurance and strength as measured by increased METs and functional capacity (6MWT)       Able to understand and use rate of perceived exertion (RPE) scale Yes       Intervention Provide education and explanation on how to use RPE scale       Expected Outcomes Short Term: Able to use RPE daily in rehab to express subjective intensity level;Long Term:  Able to use RPE to guide intensity level when exercising independently       Knowledge and understanding of Target Heart Rate Range (THRR) Yes       Intervention Provide education and explanation of THRR including how the numbers were predicted and where they are located for reference       Expected Outcomes Short Term: Able to state/look up THRR;Long Term: Able to use THRR to govern intensity when exercising independently;Short Term: Able to use daily as guideline for intensity in rehab       Understanding of Exercise Prescription Yes       Intervention Provide education, explanation, and written materials on patient's individual exercise prescription       Expected Outcomes Short Term: Able to explain program exercise prescription;Long Term: Able to explain home exercise prescription to exercise independently                Exercise Goals Re-Evaluation :  Exercise Goals Re-Evaluation     Row Name 10/19/20 1645             Exercise Goal Re-Evaluation   Exercise Goals Review Increase Physical Activity;Increase Strength and Stamina;Able to understand and use rate of perceived exertion (RPE) scale;Knowledge and understanding of Target Heart Rate Range (THRR);Understanding of Exercise Prescription       Comments Pt first day in the CRP2 program. Pt tolerated exercise well and is learning his THRR, RPE and exercise Rx.       Expected Outcomes Will contine to monitor pt and progress workloads as tolerated without sign or symptom                Discharge Exercise Prescription (Final Exercise Prescription Changes):  Exercise  Prescription Changes - 10/19/20 1643       Response to Exercise   Blood Pressure (  Admit) 118/60    Blood Pressure (Exercise) 118/56    Blood Pressure (Exit) 102/50    Heart Rate (Admit) 73 bpm    Heart Rate (Exercise) 79 bpm    Heart Rate (Exit) 63 bpm    Rating of Perceived Exertion (Exercise) 10    Symptoms none    Comments Pt first day of exercise    Duration Progress to 30 minutes of  aerobic without signs/symptoms of physical distress    Intensity THRR unchanged      Progression   Progression Continue to progress workloads to maintain intensity without signs/symptoms of physical distress.    Average METs 1.7      Resistance Training   Training Prescription Yes    Weight 2    Reps 10-15    Time 10 Minutes      NuStep   Level 1    SPM 70    Minutes 25    METs 1.7             Nutrition:  Target Goals: Understanding of nutrition guidelines, daily intake of sodium 1500mg , cholesterol 200mg , calories 30% from fat and 7% or less from saturated fats, daily to have 5 or more servings of fruits and vegetables.  Biometrics:  Pre Biometrics - 10/15/20 1558       Pre Biometrics   Waist Circumference 40.25 inches    Hip Circumference 41 inches    Waist to Hip Ratio 0.98 %    Triceps Skinfold 21 mm    % Body Fat 35.5 %    Grip Strength 39 kg    Flexibility --   Not done. Spine injury   Single Leg Stand --   Not done. Spine injury             Nutrition Therapy Plan and Nutrition Goals:   Nutrition Assessments:  MEDIFICTS Score Key: ?70 Need to make dietary changes  40-70 Heart Healthy Diet ? 40 Therapeutic Level Cholesterol Diet    Picture Your Plate Scores: <67 Unhealthy dietary pattern with much room for improvement. 41-50 Dietary pattern unlikely to meet recommendations for good health and room for improvement. 51-60 More healthful dietary pattern, with some room for improvement.  >60 Healthy dietary pattern, although there may be some specific  behaviors that could be improved.    Nutrition Goals Re-Evaluation:   Nutrition Goals Re-Evaluation:   Nutrition Goals Discharge (Final Nutrition Goals Re-Evaluation):   Psychosocial: Target Goals: Acknowledge presence or absence of significant depression and/or stress, maximize coping skills, provide positive support system. Participant is able to verbalize types and ability to use techniques and skills needed for reducing stress and depression.  Initial Review & Psychosocial Screening:  Initial Psych Review & Screening - 10/16/20 1108       Initial Review   Current issues with Current Depression;Current Sleep Concerns      Family Dynamics   Good Support System? Yes   supportive wife   Comments Shared feels down each day but knows to press on and do things. He no longer plays golf and not able to be as active as he used to be so this impacts him. However he does continue to have lunch with friends and he and his wife strive to continue to engage with family for outings. He shared he does not have the appetite he has had in prior years as given his diet had an impact on his health being that he no longer eats red meat and drinks  alcohol due to a severe case of gout in his past. He reports being put on a new medication for depression a couple weeks ago but did not have that current medication on his list but he said he will get it to [place] when he starts next week.      Barriers   Psychosocial barriers to participate in program There are no identifiable barriers or psychosocial needs.   He said he does not foresee issues in participation. His wife supports him participating in the program.     Screening Interventions   Interventions Encouraged to exercise;To provide support and resources with identified psychosocial needs;Other (comment)    Comments Check with [facility] to ensure he has found a Social worker. He shared Dr. Laurann Montana provided a list and Laterrian said he is currently in the process  of calls.    Expected Outcomes Short Term goal: Identification and review with participant of any Quality of Life or Depression concerns found by scoring the questionnaire.;Long Term goal: The participant improves quality of Life and PHQ9 Scores as seen by post scores and/or verbalization of changes             Quality of Life Scores:  Quality of Life - 10/19/20 1703       Quality of Life   Select Quality of Life      Quality of Life Scores   Health/Function Pre 11.83 %    Socioeconomic Pre 16.13 %    Psych/Spiritual Pre 9.86 %    Family Pre 21.2 %    GLOBAL Pre 13.76 %            Scores of 19 and below usually indicate a poorer quality of life in these areas.  A difference of  2-3 points is a clinically meaningful difference.  A difference of 2-3 points in the total score of the Quality of Life Index has been associated with significant improvement in overall quality of life, self-image, physical symptoms, and general health in studies assessing change in quality of life.  PHQ-9: Recent Review Flowsheet Data     Depression screen [facility] 2/9 10/15/2020 02/03/2015 08/18/2013   Decreased Interest 0 0 0   Down, Depressed, Hopeless 3  1 0   PHQ - 2 Score 3 1 0   Altered sleeping 3 - -   Tired, decreased energy 3 - -   Change in appetite 3 - -   Feeling bad or failure about yourself  0 - -   Trouble concentrating 0 - -   Moving slowly or fidgety/restless 0 - -   Suicidal thoughts (No Data)  - -   PHQ-9 Score 12 - -   Difficult doing work/chores Not difficult at all - -      Interpretation of Total Score  Total Score Depression Severity:  1-4 = Minimal depression, 5-9 = Mild depression, 10-14 = Moderate depression, 15-19 = Moderately severe depression, 20-27 = Severe depression   Psychosocial Evaluation and Intervention:   Psychosocial Re-Evaluation:   Psychosocial Discharge (Final Psychosocial Re-Evaluation):   Vocational Rehabilitation: Provide vocational rehab  assistance to qualifying candidates.   Vocational Rehab Evaluation & Intervention:  Vocational Rehab - 10/21/20 1731       Initial Vocational Rehab Evaluation & Intervention   Assessment shows need for Vocational Rehabilitation No   Jayshun is retired and does not need vocational rehab at this time            Education: Education Goals: Education classes will  be provided on a weekly basis, covering required topics. Participant will state understanding/return demonstration of topics presented.  Learning Barriers/Preferences:  Learning Barriers/Preferences - 10/15/20 1608       Learning Barriers/Preferences   Learning Barriers Hearing    Learning Preferences Audio;Group Instruction;Skilled Demonstration;Verbal Instruction;Video             Education Topics: Count Your Pulse:  -Group instruction provided by verbal instruction, demonstration, patient participation and written materials to support subject.  Instructors address importance of being able to find your pulse and how to count your pulse when at home without a heart monitor.  Patients get hands on experience counting their pulse with staff help and individually.   Heart Attack, Angina, and Risk Factor Modification:  -Group instruction provided by verbal instruction, video, and written materials to support subject.  Instructors address signs and symptoms of angina and heart attacks.    Also discuss risk factors for heart disease and how to make changes to improve heart health risk factors.   Functional Fitness:  -Group instruction provided by verbal instruction, demonstration, patient participation, and written materials to support subject.  Instructors address safety measures for doing things around the house.  Discuss how to get up and down off the floor, how to pick things up properly, how to safely get out of a chair without assistance, and balance training.   Meditation and Mindfulness:  -Group instruction  provided by verbal instruction, patient participation, and written materials to support subject.  Instructor addresses importance of mindfulness and meditation practice to help reduce stress and improve awareness.  Instructor also leads participants through a meditation exercise.    Stretching for Flexibility and Mobility:  -Group instruction provided by verbal instruction, patient participation, and written materials to support subject.  Instructors lead participants through series of stretches that are designed to increase flexibility thus improving mobility.  These stretches are additional exercise for major muscle groups that are typically performed during regular warm up and cool down.   Hands Only CPR:  -Group verbal, video, and participation provides a basic overview of AHA guidelines for community CPR. Role-play of emergencies allow participants the opportunity to practice calling for help and chest compression technique with discussion of AED use.   Hypertension: -Group verbal and written instruction that provides a basic overview of hypertension including the most recent diagnostic guidelines, risk factor reduction with self-care instructions and medication management.    Nutrition I class: Heart Healthy Eating:  -Group instruction provided by PowerPoint slides, verbal discussion, and written materials to support subject matter. The instructor gives an explanation and review of the Therapeutic Lifestyle Changes diet recommendations, which includes a discussion on lipid goals, dietary fat, sodium, fiber, plant stanol/sterol esters, sugar, and the components of a well-balanced, healthy diet.   Nutrition II class: Lifestyle Skills:  -Group instruction provided by PowerPoint slides, verbal discussion, and written materials to support subject matter. The instructor gives an explanation and review of label reading, grocery shopping for heart health, heart healthy recipe modifications, and  ways to make healthier choices when eating out.   Diabetes Question & Answer:  -Group instruction provided by PowerPoint slides, verbal discussion, and written materials to support subject matter. The instructor gives an explanation and review of diabetes co-morbidities, pre- and post-prandial blood glucose goals, pre-exercise blood glucose goals, signs, symptoms, and treatment of hypoglycemia and hyperglycemia, and foot care basics.   Diabetes Blitz:  -Group instruction provided by [employer], verbal discussion, and written materials to support  subject matter. The instructor gives an explanation and review of the physiology behind type 1 and type 2 diabetes, diabetes medications and rational behind using different medications, pre- and post-prandial blood glucose recommendations and Hemoglobin A1c goals, diabetes diet, and exercise including blood glucose guidelines for exercising safely.    Portion Distortion:  -Group instruction provided by PowerPoint slides, verbal discussion, written materials, and food models to support subject matter. The instructor gives an explanation of serving size versus portion size, changes in portions sizes over the last 20 years, and what consists of a serving from each food group.   Stress Management:  -Group instruction provided by verbal instruction, video, and written materials to support subject matter.  Instructors review role of stress in heart disease and how to cope with stress positively.     Exercising on Your Own:  -Group instruction provided by verbal instruction, power point, and written materials to support subject.  Instructors discuss benefits of exercise, components of exercise, frequency and intensity of exercise, and end points for exercise.  Also discuss use of nitroglycerin and activating EMS.  Review options of places to exercise outside of rehab.  Review guidelines for sex with heart disease.   Cardiac Drugs I:  -Group  instruction provided by verbal instruction and written materials to support subject.  Instructor reviews cardiac drug classes: antiplatelets, anticoagulants, beta blockers, and statins.  Instructor discusses reasons, side effects, and lifestyle considerations for each drug class.   Cardiac Drugs II:  -Group instruction provided by verbal instruction and written materials to support subject.  Instructor reviews cardiac drug classes: angiotensin converting enzyme inhibitors (ACE-I), angiotensin II receptor blockers (ARBs), nitrates, and calcium channel blockers.  Instructor discusses reasons, side effects, and lifestyle considerations for each drug class.   Anatomy and Physiology of the Circulatory System:  Group verbal and written instruction and models provide basic cardiac anatomy and physiology, with the coronary electrical and arterial systems. Review of: AMI, Angina, Valve disease, Heart Failure, Peripheral Artery Disease, Cardiac Arrhythmia, Pacemakers, and the ICD.   Other Education:  -Group or individual verbal, written, or video instructions that support the educational goals of the cardiac rehab program.   Holiday Eating Survival Tips:  -Group instruction provided by PowerPoint slides, verbal discussion, and written materials to support subject matter. The instructor gives patients tips, tricks, and techniques to help them not only survive but enjoy the holidays despite the onslaught of food that accompanies the holidays.   Knowledge Questionnaire Score:  Knowledge Questionnaire Score - 10/19/20 1659       Knowledge Questionnaire Score   Pre Score 17/24             Core Components/Risk Factors/Patient Goals at Admission:  Personal Goals and Risk Factors at Admission - 10/15/20 1609       Core Components/Risk Factors/Patient Goals on Admission    Weight Management Yes;Weight Loss    Intervention Weight Management: Develop a combined nutrition and exercise program  designed to reach desired caloric intake, while maintaining appropriate intake of nutrient and fiber, sodium and fats, and appropriate energy expenditure required for the weight goal.;Weight Management: Provide education and appropriate resources to help participant work on and attain dietary goals.;Weight Management/Obesity: Establish reasonable short term and long term weight goals.    Expected Outcomes Short Term: Continue to assess and modify interventions until short term weight is achieved;Long Term: Adherence to nutrition and physical activity/exercise program aimed toward attainment of established weight goal;Understanding recommendations for meals to include 15-35% energy  as protein, 25-35% energy from fat, 35-60% energy from carbohydrates, less than 200mg  of dietary cholesterol, 20-35 gm of total fiber daily;Understanding of distribution of calorie intake throughout the day with the consumption of 4-5 meals/snacks    Diabetes Yes    Intervention Provide education about signs/symptoms and action to take for hypo/hyperglycemia.;Provide education about proper nutrition, including hydration, and aerobic/resistive exercise prescription along with prescribed medications to achieve blood glucose in normal ranges: Fasting glucose 65-99 mg/dL    Expected Outcomes Short Term: Participant verbalizes understanding of the signs/symptoms and immediate care of hyper/hypoglycemia, proper foot care and importance of medication, aerobic/resistive exercise and nutrition plan for blood glucose control.;Long Term: Attainment of HbA1C < 7%.    Hypertension Yes    Intervention Provide education on lifestyle modifcations including regular physical activity/exercise, weight management, moderate sodium restriction and increased consumption of fresh fruit, vegetables, and low fat dairy, alcohol moderation, and smoking cessation.;Monitor prescription use compliance.    Expected Outcomes Short Term: Continued assessment and  intervention until BP is < 140/34mm HG in hypertensive participants. < 130/4mm HG in hypertensive participants with diabetes, heart failure or chronic kidney disease.;Long Term: Maintenance of blood pressure at goal levels.    Lipids Yes    Intervention Provide education and support for participant on nutrition & aerobic/resistive exercise along with prescribed medications to achieve LDL 70mg , HDL >40mg .    Expected Outcomes Short Term: Participant states understanding of desired cholesterol values and is compliant with medications prescribed. Participant is following exercise prescription and nutrition guidelines.;Long Term: Cholesterol controlled with medications as prescribed, with individualized exercise RX and with personalized nutrition plan. Value goals: LDL < 70mg , HDL > 40 mg.    Personal Goal Other Yes    Personal Goal Pt wants to gain stregth and balance    Intervention Will continue to monitor pt and progress workloads as tolerated without sign or symptom.    Expected Outcomes Pt will achieve his goals             Core Components/Risk Factors/Patient Goals Review:   Goals and Risk Factor Review     Row Name 10/21/20 1729             Core Components/Risk Factors/Patient Goals Review   Personal Goals Review Weight Management/Obesity;Stress;Hypertension;Lipids;Diabetes       Review Mattie started exercise on 10/19/20. Will continue to monitor       Expected Outcomes Dhruva will continue to partcipate in phase 2 cardiac rehab for exercise, nutrtion and lifestyle modifications                Core Components/Risk Factors/Patient Goals at Discharge (Final Review):   Goals and Risk Factor Review - 10/21/20 1729       Core Components/Risk Factors/Patient Goals Review   Personal Goals Review Weight Management/Obesity;Stress;Hypertension;Lipids;Diabetes    Review Wilkie started exercise on 10/19/20. Will continue to monitor    Expected Outcomes Macklin will continue to  partcipate in phase 2 cardiac rehab for exercise, nutrtion and lifestyle modifications             ITP Comments:  ITP Comments     Row Name 10/21/20 1725 10/21/20 1726         ITP Comments Dr Fransico Him MD, Medical Director 30 Day ITP Review. Draven started exercise on 10/19/20               Comments: See ITP comment

## 2020-10-21 ENCOUNTER — Other Ambulatory Visit: Payer: Self-pay

## 2020-10-21 ENCOUNTER — Encounter (HOSPITAL_COMMUNITY)
Admission: RE | Admit: 2020-10-21 | Discharge: 2020-10-21 | Disposition: A | Discharge status: Home / Self Care | Payer: Medicare Other | Source: Ambulatory Visit | Attending: Cardiology | Admitting: Cardiology

## 2020-10-21 DIAGNOSIS — Z79899 Other long term (current) drug therapy: Secondary | ICD-10-CM | POA: Diagnosis not present

## 2020-10-21 DIAGNOSIS — Z955 Presence of coronary angioplasty implant and graft: Secondary | ICD-10-CM | POA: Diagnosis not present

## 2020-10-21 DIAGNOSIS — Z7901 Long term (current) use of anticoagulants: Secondary | ICD-10-CM | POA: Diagnosis not present

## 2020-10-21 DIAGNOSIS — Z48812 Encounter for surgical aftercare following surgery on the circulatory system: Secondary | ICD-10-CM | POA: Diagnosis not present

## 2020-10-21 LAB — GLUCOSE, CAPILLARY: Glucose-Capillary: 92 mg/dL (ref 70–99)

## 2020-10-21 NOTE — Progress Notes (Signed)
Pt has not eaten since breakfast this AM. Pt provided a snack and was sent home to return Friday for exercise

## 2020-10-23 ENCOUNTER — Encounter (HOSPITAL_COMMUNITY)
Admission: RE | Admit: 2020-10-23 | Discharge: 2020-10-23 | Disposition: A | Discharge status: Home / Self Care | Payer: Medicare Other | Source: Ambulatory Visit | Attending: Cardiology | Admitting: Cardiology

## 2020-10-23 ENCOUNTER — Other Ambulatory Visit: Payer: Self-pay

## 2020-10-23 DIAGNOSIS — Z7901 Long term (current) use of anticoagulants: Secondary | ICD-10-CM | POA: Diagnosis not present

## 2020-10-23 DIAGNOSIS — Z955 Presence of coronary angioplasty implant and graft: Secondary | ICD-10-CM | POA: Diagnosis not present

## 2020-10-23 DIAGNOSIS — Z48812 Encounter for surgical aftercare following surgery on the circulatory system: Secondary | ICD-10-CM | POA: Diagnosis not present

## 2020-10-23 DIAGNOSIS — Z79899 Other long term (current) drug therapy: Secondary | ICD-10-CM | POA: Diagnosis not present

## 2020-10-26 ENCOUNTER — Other Ambulatory Visit: Payer: Self-pay

## 2020-10-26 ENCOUNTER — Encounter (HOSPITAL_COMMUNITY)
Admission: RE | Admit: 2020-10-26 | Discharge: 2020-10-26 | Disposition: A | Discharge status: Home / Self Care | Payer: Medicare Other | Source: Ambulatory Visit | Attending: Cardiology | Admitting: Cardiology

## 2020-10-26 DIAGNOSIS — Z7901 Long term (current) use of anticoagulants: Secondary | ICD-10-CM | POA: Diagnosis not present

## 2020-10-26 DIAGNOSIS — Z48812 Encounter for surgical aftercare following surgery on the circulatory system: Secondary | ICD-10-CM | POA: Diagnosis not present

## 2020-10-26 DIAGNOSIS — Z955 Presence of coronary angioplasty implant and graft: Secondary | ICD-10-CM

## 2020-10-26 DIAGNOSIS — Z79899 Other long term (current) drug therapy: Secondary | ICD-10-CM | POA: Diagnosis not present

## 2020-10-26 LAB — GLUCOSE, CAPILLARY
Glucose-Capillary: 148 mg/dL — ABNORMAL HIGH (ref 70–99)
Glucose-Capillary: 108 mg/dL — ABNORMAL HIGH (ref 70–99)

## 2020-10-27 NOTE — Progress Notes (Signed)
[name] 85 y.o. male Nutrition Note  Diagnosis: DESx2, Cx, LAD   Past Medical History:  Diagnosis Date   Arthritis    CAD S/P percutaneous coronary angioplasty 08/2004   CARDIOLOGIST-  DR DAVID Ellyn Hack; DES to LCx. X2  - Cypher 2.5 mm postdilated to 2.75 mm (20 mm and 13 mm stents);; Myoview 09/2014: LOW RISK. No Infarct or Ischemia.   Colon polyps 05/10/2005   Hyperplastic polyps   Complication of anesthesia    "takes long time to wake up"   Depression    Diabetes mellitus type 2 with complications ([place])    neuropathy; CAD borderline- no med   Diastolic dysfunction without heart failure    Moderate LVH, Gr 1 DD on Echo 2011; no CHF admissions, no tt on diuretisc   Essential hypertension    H/O: gout STABLE   History of melanoma excision 2010   FOREHEAD   History of prostate cancer 2004--  S/P SEED IMPLANTS    NO RECURRENCE   Hydrocele, left    Hyperlipidemia LDL goal <70    Impaired hearing RIGHT HEARING AID   OSA on CPAP    bipap   PAF (paroxysmal atrial fibrillation) ([place]) 12/11/2017   Admitted with Afib RVR - converted on IV Diltiazem. d/c on Eliquis; CHA2DS2Vasc = 5.   Prostate cancer [facility])    prostate , melanoma head   Shingles 07   Syncope and collapse March 2017   Likely related to post micturition, Neurontin and dehydration with orthostatic hypotension   Urgency of urination      Medications reviewed.   Current Outpatient Medications:    allopurinol (ZYLOPRIM) 300 MG tablet, Take 1 tablet (300 mg total) by mouth daily., Disp: 30 tablet, Rfl: 1   apixaban (ELIQUIS) 2.5 MG TABS tablet, Take 1 tablet (2.5 mg total) by mouth 2 (two) times daily., Disp: 180 tablet, Rfl: 2   carvedilol (COREG) 6.25 MG tablet, Take 6.25 mg by mouth 2 (two) times daily with a meal., Disp: , Rfl:    chlorthalidone (HYGROTON) 25 MG tablet, Take 12.5 mg by mouth daily., Disp: , Rfl:    Cholecalciferol (VITAMIN D3) 50 MCG (2000 UT) CAPS, Take 2,000 Units by mouth daily. (Patient  not taking: Reported on 10/07/2020), Disp: , Rfl:    clopidogrel (PLAVIX) 75 MG tablet, Take 1 tablet (75 mg total) by mouth daily., Disp: 90 tablet, Rfl: 90   diclofenac Sodium (VOLTAREN) 1 % GEL, Apply 2 g topically daily as needed (Pain). (Patient not taking: Reported on 10/07/2020), Disp: , Rfl:    diltiazem (CARDIZEM CD) 240 MG 24 hr capsule, Take 240 mg by mouth daily., Disp: , Rfl:    escitalopram (LEXAPRO) 20 MG tablet, Take 20 mg by mouth daily., Disp: , Rfl:    lidocaine (XYLOCAINE) 5 % ointment, Apply 1 application topically daily as needed for mild pain or moderate pain. (Patient not taking: Reported on 10/07/2020), Disp: , Rfl:    Lidocaine 4 % PTCH, Apply 1 patch topically daily as needed (Pain). (Patient not taking: Reported on 10/07/2020), Disp: , Rfl:    metFORMIN (GLUCOPHAGE) 500 MG tablet, Take 1 tablet (500 mg total) by mouth 2 (two) times daily with a meal., Disp: , Rfl:    nitroGLYCERIN (NITROSTAT) 0.4 MG SL tablet, Place 1 tablet (0.4 mg total) under the tongue every 5 (five) minutes as needed for chest pain., Disp: 25 tablet, Rfl: 2   ONETOUCH ULTRA test strip, USE TO TEST BLOOD SUGAR ONCE OR  TWICE A DAY (E11.22), Disp: , Rfl:    oxybutynin (DITROPAN XL) 15 MG 24 hr tablet, Take 1 tablet (15 mg total) by mouth at bedtime., Disp: 30 tablet, Rfl: 1   rosuvastatin (CRESTOR) 20 MG tablet, Take 20 mg by mouth every evening., Disp: , Rfl:    sertraline (ZOLOFT) 50 MG tablet, Take 50 mg by mouth daily. (Patient not taking: No sig reported), Disp: , Rfl:    tamsulosin (FLOMAX) 0.4 MG CAPS capsule, Take 1 capsule (0.4 mg total) by mouth at bedtime., Disp: 30 capsule, Rfl: 1   telmisartan (MICARDIS) 40 MG tablet, Take 1 tablet (40 mg total) by mouth daily., Disp: 90 tablet, Rfl: 3   Ht Readings from Last 1 Encounters:  10/15/20 5' 8.4" (1.737 m)     Wt Readings from Last 3 Encounters:  10/15/20 187 lb 2.7 oz (84.9 kg)  09/28/20 188 lb (85.3 kg)  08/26/20 194 lb 9.6 oz (88.3 kg)      There is no height or weight on file to calculate BMI.   Social History   Tobacco Use  Smoking Status Never  Smokeless Tobacco Never  Tobacco Comments   occ alcohol     Lab Results  Component Value Date   CHOL 106 08/05/2020   Lab Results  Component Value Date   HDL 38 (L) 08/05/2020   Lab Results  Component Value Date   LDLCALC 44 08/05/2020   Lab Results  Component Value Date   TRIG 121 08/05/2020     Lab Results  Component Value Date   HGBA1C 6.2 (H) 08/03/2020     CBG (last 3)  No results for input(s): GLUCAP in the last 72 hours.   Nutrition Note  Spoke with pt. Nutrition Plan and Nutrition Survey goals reviewed with pt. Pt is not following a Heart Healthy diet. Pt is satisfied with his diet. He is not interested in making any changes or any heart healthy diet education.  Pt has Type 2 Diabetes. Last A1c indicates blood glucose well-controlled. Pt checks CBG's 1 times a day. Fasting CBG's reportedly 100-130 mg/dL.    Pt expressed understanding of the information reviewed.   Nutrition Diagnosis  Not ready for lifestyle change related to denial of need for change as evidenced by pt report  Nutrition Intervention Pt's individual nutrition plan reviewed with pt. Benefits of adopting Heart Healthy diet discussed when Picture Your Plate reviewed.  Continue client-centered nutrition education by RD, as part of interdisciplinary care.  Goal(s) Pt to build a healthy plate including vegetables, fruits, whole grains, and low-fat dairy products in a heart healthy meal plan.   Plan:  Will provide client-centered nutrition education as part of interdisciplinary care Monitor and evaluate progress toward nutrition goal with team.   Michaele Offer, MS, RDN, LDN

## 2020-10-28 ENCOUNTER — Other Ambulatory Visit: Payer: Self-pay

## 2020-10-28 ENCOUNTER — Encounter (HOSPITAL_COMMUNITY)
Admission: RE | Admit: 2020-10-28 | Discharge: 2020-10-28 | Disposition: A | Discharge status: Home / Self Care | Payer: Medicare Other | Source: Ambulatory Visit | Attending: Cardiology | Admitting: Cardiology

## 2020-10-28 DIAGNOSIS — I480 Paroxysmal atrial fibrillation: Secondary | ICD-10-CM | POA: Diagnosis not present

## 2020-10-28 DIAGNOSIS — I701 Atherosclerosis of renal artery: Secondary | ICD-10-CM | POA: Diagnosis not present

## 2020-10-28 DIAGNOSIS — F331 Major depressive disorder, recurrent, moderate: Secondary | ICD-10-CM | POA: Diagnosis not present

## 2020-10-28 DIAGNOSIS — N1831 Chronic kidney disease, stage 3a: Secondary | ICD-10-CM | POA: Diagnosis not present

## 2020-10-28 DIAGNOSIS — E1122 Type 2 diabetes mellitus with diabetic chronic kidney disease: Secondary | ICD-10-CM | POA: Diagnosis not present

## 2020-10-28 DIAGNOSIS — Z955 Presence of coronary angioplasty implant and graft: Secondary | ICD-10-CM

## 2020-10-28 DIAGNOSIS — Z48812 Encounter for surgical aftercare following surgery on the circulatory system: Secondary | ICD-10-CM | POA: Diagnosis not present

## 2020-10-28 DIAGNOSIS — Z7901 Long term (current) use of anticoagulants: Secondary | ICD-10-CM | POA: Diagnosis not present

## 2020-10-28 DIAGNOSIS — I2510 Atherosclerotic heart disease of native coronary artery without angina pectoris: Secondary | ICD-10-CM | POA: Diagnosis not present

## 2020-10-28 DIAGNOSIS — Z8546 Personal history of malignant neoplasm of prostate: Secondary | ICD-10-CM | POA: Diagnosis not present

## 2020-10-28 DIAGNOSIS — N1832 Chronic kidney disease, stage 3b: Secondary | ICD-10-CM | POA: Diagnosis not present

## 2020-10-28 DIAGNOSIS — I10 Essential (primary) hypertension: Secondary | ICD-10-CM | POA: Diagnosis not present

## 2020-10-28 DIAGNOSIS — Z79899 Other long term (current) drug therapy: Secondary | ICD-10-CM | POA: Diagnosis not present

## 2020-10-28 DIAGNOSIS — E782 Mixed hyperlipidemia: Secondary | ICD-10-CM | POA: Diagnosis not present

## 2020-10-30 ENCOUNTER — Encounter (HOSPITAL_COMMUNITY)
Admission: RE | Admit: 2020-10-30 | Discharge: 2020-10-30 | Disposition: A | Discharge status: Home / Self Care | Payer: Medicare Other | Source: Ambulatory Visit | Attending: Cardiology | Admitting: Cardiology

## 2020-10-30 ENCOUNTER — Other Ambulatory Visit: Payer: Self-pay

## 2020-10-30 DIAGNOSIS — Z955 Presence of coronary angioplasty implant and graft: Secondary | ICD-10-CM | POA: Diagnosis not present

## 2020-10-30 DIAGNOSIS — Z7901 Long term (current) use of anticoagulants: Secondary | ICD-10-CM | POA: Diagnosis not present

## 2020-10-30 DIAGNOSIS — Z79899 Other long term (current) drug therapy: Secondary | ICD-10-CM | POA: Diagnosis not present

## 2020-10-30 DIAGNOSIS — I10 Essential (primary) hypertension: Secondary | ICD-10-CM | POA: Diagnosis not present

## 2020-10-30 DIAGNOSIS — Z48812 Encounter for surgical aftercare following surgery on the circulatory system: Secondary | ICD-10-CM | POA: Diagnosis not present

## 2020-11-02 ENCOUNTER — Other Ambulatory Visit: Payer: Self-pay

## 2020-11-02 ENCOUNTER — Encounter (HOSPITAL_COMMUNITY)
Admission: RE | Admit: 2020-11-02 | Discharge: 2020-11-02 | Disposition: A | Discharge status: Home / Self Care | Payer: Medicare Other | Source: Ambulatory Visit | Attending: Cardiology | Admitting: Cardiology

## 2020-11-02 DIAGNOSIS — Z955 Presence of coronary angioplasty implant and graft: Secondary | ICD-10-CM

## 2020-11-02 MED ORDER — CLOPIDOGREL BISULFATE 75 MG PO TABS: 75.0000 mg | ORAL_TABLET | Freq: Every day | ORAL | 0 refills | Status: DC

## 2020-11-02 NOTE — Progress Notes (Signed)
CARDIAC REHAB PHASE 2  Reviewed home Ex Rx with pt. Pt is tolerating exercise well. Pt was apprehensive to add addional days to his cardiac rehab sessions, but stated he would be willing to add one addional day with his [employer] sne[employer] club. Pt advised on temperature precautions, THRR, RPE scale, NTG usage and S/S to stop exercise and when to call MD vs 911. Reinforced warm up, cool down and stretches.  Pt verbalized understanding and all questions were answered. Pt was given a copy.   Kirby Funk ACSM-EP 10-30-2020 [phone]

## 2020-11-04 ENCOUNTER — Other Ambulatory Visit: Payer: Self-pay

## 2020-11-04 ENCOUNTER — Encounter (HOSPITAL_COMMUNITY)
Admission: RE | Admit: 2020-11-04 | Discharge: 2020-11-04 | Disposition: A | Discharge status: Home / Self Care | Payer: Medicare Other | Source: Ambulatory Visit | Attending: Cardiology | Admitting: Cardiology

## 2020-11-04 DIAGNOSIS — Z955 Presence of coronary angioplasty implant and graft: Secondary | ICD-10-CM

## 2020-11-06 ENCOUNTER — Other Ambulatory Visit: Payer: Self-pay

## 2020-11-06 ENCOUNTER — Encounter (HOSPITAL_COMMUNITY)
Admission: RE | Admit: 2020-11-06 | Discharge: 2020-11-06 | Disposition: A | Discharge status: Home / Self Care | Payer: Medicare Other | Source: Ambulatory Visit | Attending: Cardiology | Admitting: Cardiology

## 2020-11-06 DIAGNOSIS — Z955 Presence of coronary angioplasty implant and graft: Secondary | ICD-10-CM

## 2020-11-09 ENCOUNTER — Encounter (HOSPITAL_COMMUNITY)
Admission: RE | Admit: 2020-11-09 | Discharge: 2020-11-09 | Disposition: A | Discharge status: Home / Self Care | Payer: Medicare Other | Source: Ambulatory Visit | Attending: Cardiology | Admitting: Cardiology

## 2020-11-09 ENCOUNTER — Other Ambulatory Visit: Payer: Self-pay

## 2020-11-09 DIAGNOSIS — Z955 Presence of coronary angioplasty implant and graft: Secondary | ICD-10-CM

## 2020-11-10 DIAGNOSIS — E119 Type 2 diabetes mellitus without complications: Secondary | ICD-10-CM | POA: Diagnosis not present

## 2020-11-10 DIAGNOSIS — Z961 Presence of intraocular lens: Secondary | ICD-10-CM | POA: Diagnosis not present

## 2020-11-10 DIAGNOSIS — Z7984 Long term (current) use of oral hypoglycemic drugs: Secondary | ICD-10-CM | POA: Diagnosis not present

## 2020-11-11 ENCOUNTER — Other Ambulatory Visit: Payer: Self-pay

## 2020-11-11 ENCOUNTER — Encounter (HOSPITAL_COMMUNITY)
Admission: RE | Admit: 2020-11-11 | Discharge: 2020-11-11 | Disposition: A | Discharge status: Home / Self Care | Payer: Medicare Other | Source: Ambulatory Visit | Attending: Cardiology | Admitting: Cardiology

## 2020-11-11 DIAGNOSIS — Z955 Presence of coronary angioplasty implant and graft: Secondary | ICD-10-CM | POA: Diagnosis not present

## 2020-11-12 NOTE — Progress Notes (Signed)
Cardiac Individual Treatment Plan  Patient Details  Name: [name] MRN: [id number] Date of Birth: 1935-06-04 Referring Provider:   Flowsheet Row CARDIAC REHAB PHASE II ORIENTATION from 10/15/2020 in [facility]  Referring Provider Dr. Glenetta Hew, MD       Initial Encounter Date:  Flowsheet Row CARDIAC REHAB PHASE II ORIENTATION from 10/15/2020 in [facility]  Date 10/15/20       Visit Diagnosis: S/P drug eluting coronary stent placement  Patient's Home Medications on Admission:  Current Outpatient Medications:    allopurinol (ZYLOPRIM) 300 MG tablet, Take 1 tablet (300 mg total) by mouth daily., Disp: 30 tablet, Rfl: 1   apixaban (ELIQUIS) 2.5 MG TABS tablet, Take 1 tablet (2.5 mg total) by mouth 2 (two) times daily., Disp: 180 tablet, Rfl: 2   carvedilol (COREG) 6.25 MG tablet, Take 6.25 mg by mouth 2 (two) times daily with a meal., Disp: , Rfl:    chlorthalidone (HYGROTON) 25 MG tablet, Take 12.5 mg by mouth daily., Disp: , Rfl:    Cholecalciferol (VITAMIN D3) 50 MCG (2000 UT) CAPS, Take 2,000 Units by mouth daily. (Patient not taking: Reported on 10/07/2020), Disp: , Rfl:    clopidogrel (PLAVIX) 75 MG tablet, Take 1 tablet (75 mg total) by mouth daily., Disp: 90 tablet, Rfl: 0   diclofenac Sodium (VOLTAREN) 1 % GEL, Apply 2 g topically daily as needed (Pain). (Patient not taking: Reported on 10/07/2020), Disp: , Rfl:    diltiazem (CARDIZEM CD) 240 MG 24 hr capsule, Take 240 mg by mouth daily., Disp: , Rfl:    escitalopram (LEXAPRO) 20 MG tablet, Take 20 mg by mouth daily., Disp: , Rfl:    lidocaine (XYLOCAINE) 5 % ointment, Apply 1 application topically daily as needed for mild pain or moderate pain. (Patient not taking: Reported on 10/07/2020), Disp: , Rfl:    Lidocaine 4 % PTCH, Apply 1 patch topically daily as needed (Pain). (Patient not taking: Reported on 10/07/2020), Disp: , Rfl:    metFORMIN (GLUCOPHAGE) 500 MG  tablet, Take 1 tablet (500 mg total) by mouth 2 (two) times daily with a meal., Disp: , Rfl:    nitroGLYCERIN (NITROSTAT) 0.4 MG SL tablet, Place 1 tablet (0.4 mg total) under the tongue every 5 (five) minutes as needed for chest pain., Disp: 25 tablet, Rfl: 2   ONETOUCH ULTRA test strip, USE TO TEST BLOOD SUGAR ONCE OR TWICE A DAY (E11.22), Disp: , Rfl:    oxybutynin (DITROPAN XL) 15 MG 24 hr tablet, Take 1 tablet (15 mg total) by mouth at bedtime., Disp: 30 tablet, Rfl: 1   rosuvastatin (CRESTOR) 20 MG tablet, Take 20 mg by mouth every evening., Disp: , Rfl:    sertraline (ZOLOFT) 50 MG tablet, Take 50 mg by mouth daily. (Patient not taking: No sig reported), Disp: , Rfl:    tamsulosin (FLOMAX) 0.4 MG CAPS capsule, Take 1 capsule (0.4 mg total) by mouth at bedtime., Disp: 30 capsule, Rfl: 1   telmisartan (MICARDIS) 40 MG tablet, Take 1 tablet (40 mg total) by mouth daily., Disp: 90 tablet, Rfl: 3  Past Medical History: Past Medical History:  Diagnosis Date   Arthritis    CAD S/P percutaneous coronary angioplasty 08/2004   CARDIOLOGIST-  DR DAVID HARDING; DES to LCx. X2  - Cypher 2.5 mm postdilated to 2.75 mm (20 mm and 13 mm stents);; Myoview 09/2014: LOW RISK. No Infarct or Ischemia.   Colon polyps 05/10/2005  Hyperplastic polyps   Complication of anesthesia    "takes long time to wake up"   Depression    Diabetes mellitus type 2 with complications ([place])    neuropathy; CAD borderline- no med   Diastolic dysfunction without heart failure    Moderate LVH, Gr 1 DD on Echo 2011; no CHF admissions, no tt on diuretisc   Essential hypertension    H/O: gout STABLE   History of melanoma excision 2010   FOREHEAD   History of prostate cancer 2004--  S/P SEED IMPLANTS    NO RECURRENCE   Hydrocele, left    Hyperlipidemia LDL goal <70    Impaired hearing RIGHT HEARING AID   OSA on CPAP    bipap   PAF (paroxysmal atrial fibrillation) ([place]) 12/11/2017   Admitted with Afib RVR - converted on IV  Diltiazem. d/c on Eliquis; CHA2DS2Vasc = 5.   Prostate cancer [facility])    prostate , melanoma head   Shingles 07   Syncope and collapse March 2017   Likely related to post micturition, Neurontin and dehydration with orthostatic hypotension   Urgency of urination     Tobacco Use: Social History   Tobacco Use  Smoking Status Never  Smokeless Tobacco Never  Tobacco Comments   occ alcohol    Labs: Recent Review Flowsheet Data     Labs for ITP Cardiac and Pulmonary Rehab Latest Ref Rng & Units 10/02/2012 04/16/2013 12/01/2014 08/03/2020 08/05/2020   Cholestrol 0 - 200 mg/dL 145 112 - - 106   LDLCALC 0 - 99 mg/dL 78 52 - - 44   HDL >40 mg/dL 36(L) 31(L) - - 38(L)   Trlycerides <150 mg/dL 157(H) 143 - - 121   Hemoglobin A1c 4.8 - 5.6 % - - 6.9(H) 6.2(H) -       Capillary Blood Glucose: Lab Results  Component Value Date   GLUCAP 108 (H) 10/23/2020   GLUCAP 148 (H) 10/23/2020   GLUCAP 92 10/21/2020   GLUCAP 91 10/19/2020   GLUCAP 98 10/19/2020     Exercise Target Goals: Exercise Program Goal: Individual exercise prescription set using results from initial 6 min walk test and THRR while considering  patient's activity barriers and safety.   Exercise Prescription Goal: Initial exercise prescription builds to 30-45 minutes a day of aerobic activity, 2-3 days per week.  Home exercise guidelines will be given to patient during program as part of exercise prescription that the participant will acknowledge.  Activity Barriers & Risk Stratification:  Activity Barriers & Cardiac Risk Stratification - 10/15/20 1603       Activity Barriers & Cardiac Risk Stratification   Activity Barriers Neck/Spine Problems;Back Problems;Joint Problems;Balance Concerns;History of Falls;Assistive Device    Cardiac Risk Stratification High             6 Minute Walk:  6 Minute Walk     Row Name 10/15/20 1601         6 Minute Walk   Phase Initial  STEPPER TEST     Distance 1378 feet     Walk  Time 6 minutes     # of Rest Breaks 0     MPH 2.61     METS 1.27     RPE 11     Perceived Dyspnea  0     VO2 Peak 4.45     Symptoms No     Resting HR 58 bpm     Resting BP 100/52     Resting Oxygen Saturation  98 %     Exercise Oxygen Saturation  during 6 min walk 98 %     Max Ex. HR 56 bpm     Max Ex. BP 130/52     2 Minute Post BP 120/52              Oxygen Initial Assessment:   Oxygen Re-Evaluation:   Oxygen Discharge (Final Oxygen Re-Evaluation):   Initial Exercise Prescription:  Initial Exercise Prescription - 10/15/20 1600       Date of Initial Exercise RX and Referring Provider   Date 10/15/20    Referring Provider Dr. Glenetta Hew, MD    Expected Discharge Date 12/11/20      NuStep   Level 1    SPM 70    Minutes 25    METs 1.4      Prescription Details   Frequency (times per week) 3    Duration Progress to 30 minutes of continuous aerobic without signs/symptoms of physical distress      Intensity   THRR 40-80% of Max Heartrate 55-110    Ratings of Perceived Exertion 11-13    Perceived Dyspnea 0-4      Progression   Progression Continue progressive overload as per policy without signs/symptoms or physical distress.      Resistance Training   Training Prescription Yes    Weight 2    Reps 10-15             Perform Capillary Blood Glucose checks as needed.  Exercise Prescription Changes:   Exercise Prescription Changes     Row Name 10/19/20 1643 10/30/20 1630 11/13/20 1600         Response to Exercise   Blood Pressure (Admit) 118/60 102/50 114/64     Blood Pressure (Exercise) 118/56 110/50 122/68     Blood Pressure (Exit) 102/50 104/62 110/50     Heart Rate (Admit) 73 bpm 63 bpm 48 bpm     Heart Rate (Exercise) 79 bpm 61 bpm 62 bpm     Heart Rate (Exit) 63 bpm 54 bpm 53 bpm     Rating of Perceived Exertion (Exercise) _0 Symptoms none none none     Comments Pt first day of exercise Reviewed MET's goals and home ExRx  Reviewed MET's     Duration Progress to 30 minutes of  aerobic without signs/symptoms of physical distress Progress to 30 minutes of  aerobic without signs/symptoms of physical distress Progress to 30 minutes of  aerobic without signs/symptoms of physical distress     Intensity THRR unchanged THRR unchanged THRR unchanged           Progression   Progression Continue to progress workloads to maintain intensity without signs/symptoms of physical distress. Continue to progress workloads to maintain intensity without signs/symptoms of physical distress. Continue to progress workloads to maintain intensity without signs/symptoms of physical distress.     Average METs 1.7 1.8 1.8           Resistance Training   Training Prescription Yes Yes Yes     Weight _1 Reps 10-15 10-15 10-15     Time 10 Minutes 10 Minutes 10 Minutes           Interval Training   Interval Training -- -- No           NuStep   Level _2 SPM 70 70 70  Minutes _0 METs 1.7 1.8 1.8           Home Exercise Plan   Plans to continue exercise at -- [facility]     Frequency -- Add 1 additional day to program exercise sessions. Add 1 additional day to program exercise sessions.     Initial Home Exercises Provided -- 10/30/20 10/30/20              Exercise Comments:   Exercise Comments     Row Name 10/19/20 1647 10/30/20 1630 11/13/20 1635       Exercise Comments Pt first day in the CRP2 program. Pt tolerated exercise well without sign or symptom Reviewed MET's, goals and home Ex Rx with pt. Pt tolerated exercise well with an average MET level of 1.8. Pt was apprehensive to add addional days to his cardiac rehab sessions, but stated he would be willing to do one addional day with his silver sneakers club. Pt states he is feeling like it is early to tell about his goals, but he did decide to order a Rossie Bretado to help with his balance and he hopes  with time he will gain more strength and endurance. Reviewed MET's with pt today. Pt is tolerating exercise well with an average MET level of 1.8. Will continue to monitor pt and progress workloads as tolerated without sign or symptom.              Exercise Goals and Review:   Exercise Goals     Row Name 10/15/20 1606             Exercise Goals   Increase Physical Activity Yes       Intervention Provide advice, education, support and counseling about physical activity/exercise needs.;Develop an individualized exercise prescription for aerobic and resistive training based on initial evaluation findings, risk stratification, comorbidities and participant's personal goals.       Expected Outcomes Short Term: Attend rehab on a regular basis to increase amount of physical activity.;Long Term: Add in home exercise to make exercise part of routine and to increase amount of physical activity.;Long Term: Exercising regularly at least 3-5 days a week.       Increase Strength and Stamina Yes       Intervention Provide advice, education, support and counseling about physical activity/exercise needs.;Develop an individualized exercise prescription for aerobic and resistive training based on initial evaluation findings, risk stratification, comorbidities and participant's personal goals.       Expected Outcomes Short Term: Increase workloads from initial exercise prescription for resistance, speed, and METs.;Short Term: Perform resistance training exercises routinely during rehab and add in resistance training at home;Long Term: Improve cardiorespiratory fitness, muscular endurance and strength as measured by increased METs and functional capacity (6MWT)       Able to understand and use rate of perceived exertion (RPE) scale Yes       Intervention Provide education and explanation on how to use RPE scale       Expected Outcomes Short Term: Able to use RPE daily in rehab to express subjective intensity  level;Long Term:  Able to use RPE to guide intensity level when exercising independently       Knowledge and understanding of Target Heart Rate Range (THRR) Yes       Intervention Provide education and explanation of THRR including how the numbers were predicted and where they are located for reference  Expected Outcomes Short Term: Able to state/look up THRR;Long Term: Able to use THRR to govern intensity when exercising independently;Short Term: Able to use daily as guideline for intensity in rehab       Understanding of Exercise Prescription Yes       Intervention Provide education, explanation, and written materials on patient's individual exercise prescription       Expected Outcomes Short Term: Able to explain program exercise prescription;Long Term: Able to explain home exercise prescription to exercise independently                Exercise Goals Re-Evaluation :  Exercise Goals Re-Evaluation     Row Name 10/19/20 1645 10/30/20 1630           Exercise Goal Re-Evaluation   Exercise Goals Review Increase Physical Activity;Increase Strength and Stamina;Able to understand and use rate of perceived exertion (RPE) scale;Knowledge and understanding of Target Heart Rate Range (THRR);Understanding of Exercise Prescription Increase Physical Activity;Increase Strength and Stamina;Able to understand and use rate of perceived exertion (RPE) scale;Knowledge and understanding of Target Heart Rate Range (THRR);Understanding of Exercise Prescription      Comments Pt first day in the CRP2 program. Pt tolerated exercise well and is learning his THRR, RPE and exercise Rx. Reviewed MET's, goals and home Ex Rx with pt. Pt tolerated exercise well with an average MET level of 1.8. Pt was apprehensive to add addional days to his cardiac rehab sessions, but stated he would be willing to do one addional day with his silver sneakers club. Pt states he is feeling like it is early to tell about his goals, but  he did decide to order a  to help with his balance and he hopes with time he will gain more strength and endurance.      Expected Outcomes Will contine to monitor pt and progress workloads as tolerated without sign or symptom pt will add one day of exercise with silver sneakers for 30-45 minutes Will contine to monitor pt and progress workloads as tolerated without sign or symptom               Discharge Exercise Prescription (Final Exercise Prescription Changes):  Exercise Prescription Changes - 11/13/20 1600       Response to Exercise   Blood Pressure (Admit) 114/64    Blood Pressure (Exercise) 122/68    Blood Pressure (Exit) 110/50    Heart Rate (Admit) 48 bpm    Heart Rate (Exercise) 62 bpm    Heart Rate (Exit) 53 bpm    Rating of Perceived Exertion (Exercise) 12    Symptoms none    Comments Reviewed MET's    Duration Progress to 30 minutes of  aerobic without signs/symptoms of physical distress    Intensity THRR unchanged      Progression   Progression Continue to progress workloads to maintain intensity without signs/symptoms of physical distress.    Average METs 1.8      Resistance Training   Training Prescription Yes    Weight 3    Reps 10-15    Time 10 Minutes      Interval Training   Interval Training No      NuStep   Level 2    SPM 70    Minutes 25    METs 1.8      Home Exercise Plan   Plans to continue exercise at [facility] (comment)    Frequency Add 1 additional day to program exercise sessions.  Initial Home Exercises Provided 10/30/20             Nutrition:  Target Goals: Understanding of nutrition guidelines, daily intake of sodium <1599m, cholesterol <2058m calories 30% from fat and 7% or less from saturated fats, daily to have 5 or more servings of fruits and vegetables.  Biometrics:  Pre Biometrics - 10/15/20 1558       Pre Biometrics   Waist Circumference 40.25 inches    Hip Circumference 41 inches    Waist to  Hip Ratio 0.98 %    Triceps Skinfold 21 mm    % Body Fat 35.5 %    Grip Strength 39 kg    Flexibility --   Not done. Spine injury   Single Leg Stand --   Not done. Spine injury             Nutrition Therapy Plan and Nutrition Goals:  Nutrition Therapy & Goals - 10/27/20 1028       Nutrition Therapy   Diet TLC    Drug/Food Interactions Statins/Certain Fruits      Personal Nutrition Goals   Nutrition Goal Pt to build a healthy plate including vegetables, fruits, whole grains, and low-fat dairy products in a heart healthy meal plan.      Intervention Plan   Intervention Prescribe, educate and counsel regarding individualized specific dietary modifications aiming towards targeted core components such as weight, hypertension, lipid management, diabetes, heart failure and other comorbidities.    Expected Outcomes Short Term Goal: Understand basic principles of dietary content, such as calories, fat, sodium, cholesterol and nutrients.             Nutrition Assessments:  MEDIFICTS Score Key: ?70 Need to make dietary changes  40-70 Heart Healthy Diet ? 40 Therapeutic Level Cholesterol Diet   Flowsheet Row CARDIAC REHAB PHASE [facility] on [facility] 57      Picture Your Plate Scores: <4<09nhealthy dietary pattern with much room for improvement. 41-50 Dietary pattern unlikely to meet recommendations for good health and room for improvement. 51-60 More healthful dietary pattern, with some room for improvement.  >60 Healthy dietary pattern, although there may be some specific behaviors that could be improved.    Nutrition Goals Re-Evaluation:  Nutrition Goals Re-Evaluation     [place] 10/27/20 1028 11/12/20 1136           Goals   Current Weight 187 lb (84.8 kg) 192 lb 0.3 oz (87.1 kg)      Nutrition Goal -- Pt to build a healthy plate including vegetables, fruits, whole  grains, and low-fat dairy products in a heart healthy meal plan.               Nutrition Goals Re-Evaluation:  Nutrition Goals Re-Evaluation     [place] 10/27/20 1028 11/12/20 1136           Goals   Current Weight 187 lb (84.8 kg) 192 lb 0.3 oz (87.1 kg)      Nutrition Goal -- Pt to build a healthy plate including vegetables, fruits, whole grains, and low-fat dairy products in a heart healthy meal plan.               Nutrition Goals Discharge (Final Nutrition Goals Re-Evaluation):  Nutrition Goals Re-Evaluation - 11/12/20 1136       Goals   Current Weight 192 lb 0.3 oz (87.1 kg)  Nutrition Goal Pt to build a healthy plate including vegetables, fruits, whole grains, and low-fat dairy products in a heart healthy meal plan.             Psychosocial: Target Goals: Acknowledge presence or absence of significant depression and/or stress, maximize coping skills, provide positive support system. Participant is able to verbalize types and ability to use techniques and skills needed for reducing stress and depression.  Initial Review & Psychosocial Screening:  Initial Psych Review & Screening - 10/16/20 1108       Initial Review   Current issues with Current Depression;Current Sleep Concerns      Family Dynamics   Good Support System? Yes   supportive wife   Comments Shared feels down each day but knows to press on and do things. He no longer plays golf and not able to be as active as he used to be so this impacts him. However he does continue to have lunch with friends and he and his wife strive to continue to engage with family for outings. He shared he does not have the appetite he has had in prior years as given his diet had an impact on his health being that he no longer eats red meat and drinks alcohol due to a severe case of gout in his past. He reports being put on a new medication for depression a couple weeks ago but did not have that current medication on his  list but he said he will get it to [place] when he starts next week.      Barriers   Psychosocial barriers to participate in program There are no identifiable barriers or psychosocial needs.   He said he does not foresee issues in participation. His wife supports him participating in the program.     Screening Interventions   Interventions Encouraged to exercise;To provide support and resources with identified psychosocial needs;Other (comment)    Comments Check with [place] to ensure he has found a Social worker. He shared Dr. Laurann Montana provided a list and Drue said he is currently in the process of calls.    Expected Outcomes Short Term goal: Identification and review with participant of any Quality of Life or Depression concerns found by scoring the questionnaire.;Long Term goal: The participant improves quality of Life and PHQ9 Scores as seen by post scores and/or verbalization of changes             Quality of Life Scores:  Quality of Life - 10/19/20 1703       Quality of Life   Select Quality of Life      Quality of Life Scores   Health/Function Pre 11.83 %    Socioeconomic Pre 16.13 %    Psych/Spiritual Pre 9.86 %    Family Pre 21.2 %    GLOBAL Pre 13.76 %            Scores of 19 and below usually indicate a poorer quality of life in these areas.  A difference of  2-3 points is a clinically meaningful difference.  A difference of 2-3 points in the total score of the Quality of Life Index has been associated with significant improvement in overall quality of life, self-image, physical symptoms, and general health in studies assessing change in quality of life.  PHQ-9: Recent Review Flowsheet Data     Depression screen [facility] 2/9 10/15/2020 02/03/2015 08/18/2013   Decreased Interest 0 0 0   Down, Depressed, Hopeless 3  1 0  PHQ - 2 Score 3 1 0   Altered sleeping 3 - -   Tired, decreased energy 3 - -   Change in appetite 3 - -   Feeling bad or failure about yourself  0 - -    Trouble concentrating 0 - -   Moving slowly or fidgety/restless 0 - -   Suicidal thoughts (No Data)  - -   PHQ-9 Score 12 - -   Difficult doing work/chores Not difficult at all - -      Interpretation of Total Score  Total Score Depression Severity:  1-4 = Minimal depression, 5-9 = Mild depression, 10-14 = Moderate depression, 15-19 = Moderately severe depression, 20-27 = Severe depression   Psychosocial Evaluation and Intervention:   Psychosocial Re-Evaluation:  Psychosocial Re-Evaluation     Row Name 11/12/20 1450             Psychosocial Re-Evaluation   Current issues with Current Sleep Concerns;Current Depression       Comments [name] has not voiced any increased depression or sleep concerns while partcipating in phase 2 CR       Expected Outcomes [name] will have controlled depression/ sleep concerns upon completion of phase 2 CR       Interventions Encouraged to attend Cardiac Rehabilitation for the exercise       Continue Psychosocial Services  No Follow up required                Psychosocial Discharge (Final Psychosocial Re-Evaluation):  Psychosocial Re-Evaluation - 11/12/20 1450       Psychosocial Re-Evaluation   Current issues with Current Sleep Concerns;Current Depression    Comments [name] has not voiced any increased depression or sleep concerns while partcipating in phase 2 CR    Expected Outcomes [name] will have controlled depression/ sleep concerns upon completion of phase 2 CR    Interventions Encouraged to attend Cardiac Rehabilitation for the exercise    Continue Psychosocial Services  No Follow up required             Vocational Rehabilitation: Provide vocational rehab assistance to qualifying candidates.   Vocational Rehab Evaluation & Intervention:  Vocational Rehab - 10/21/20 1731       Initial Vocational Rehab Evaluation & Intervention   Assessment shows need for Vocational Rehabilitation No   [name] is retired and does not  need vocational rehab at this time            Education: Education Goals: Education classes will be provided on a weekly basis, covering required topics. Participant will state understanding/return demonstration of topics presented.  Learning Barriers/Preferences:  Learning Barriers/Preferences - 10/15/20 1608       Learning Barriers/Preferences   Learning Barriers Hearing    Learning Preferences Audio;Group Instruction;Skilled Demonstration;Verbal Instruction;Video             Education Topics: Count Your Pulse:  -Group instruction provided by verbal instruction, demonstration, patient participation and written materials to support subject.  Instructors address importance of being able to find your pulse and how to count your pulse when at home without a heart monitor.  Patients get hands on experience counting their pulse with staff help and individually.   Heart Attack, Angina, and Risk Factor Modification:  -Group instruction provided by verbal instruction, video, and written materials to support subject.  Instructors address signs and symptoms of angina and heart attacks.    Also discuss risk factors for heart disease and how to make changes to improve  heart health risk factors.   Functional Fitness:  -Group instruction provided by verbal instruction, demonstration, patient participation, and written materials to support subject.  Instructors address safety measures for doing things around the house.  Discuss how to get up and down off the floor, how to pick things up properly, how to safely get out of a chair without assistance, and balance training.   Meditation and Mindfulness:  -Group instruction provided by verbal instruction, patient participation, and written materials to support subject.  Instructor addresses importance of mindfulness and meditation practice to help reduce stress and improve awareness.  Instructor also leads participants through a meditation  exercise.    Stretching for Flexibility and Mobility:  -Group instruction provided by verbal instruction, patient participation, and written materials to support subject.  Instructors lead participants through series of stretches that are designed to increase flexibility thus improving mobility.  These stretches are additional exercise for major muscle groups that are typically performed during regular warm up and cool down.   Hands Only CPR:  -Group verbal, video, and participation provides a basic overview of AHA guidelines for community CPR. Role-play of emergencies allow participants the opportunity to practice calling for help and chest compression technique with discussion of AED use.   Hypertension: -Group verbal and written instruction that provides a basic overview of hypertension including the most recent diagnostic guidelines, risk factor reduction with self-care instructions and medication management.    Nutrition I class: Heart Healthy Eating:  -Group instruction provided by PowerPoint slides, verbal discussion, and written materials to support subject matter. The instructor gives an explanation and review of the Therapeutic Lifestyle Changes diet recommendations, which includes a discussion on lipid goals, dietary fat, sodium, fiber, plant stanol/sterol esters, sugar, and the components of a well-balanced, healthy diet.   Nutrition II class: Lifestyle Skills:  -Group instruction provided by PowerPoint slides, verbal discussion, and written materials to support subject matter. The instructor gives an explanation and review of label reading, grocery shopping for heart health, heart healthy recipe modifications, and ways to make healthier choices when eating out.   Diabetes Question & Answer:  -Group instruction provided by PowerPoint slides, verbal discussion, and written materials to support subject matter. The instructor gives an explanation and review of diabetes  co-morbidities, pre- and post-prandial blood glucose goals, pre-exercise blood glucose goals, signs, symptoms, and treatment of hypoglycemia and hyperglycemia, and foot care basics.   Diabetes Blitz:  -Group instruction provided by PowerPoint slides, verbal discussion, and written materials to support subject matter. The instructor gives an explanation and review of the physiology behind type 1 and type 2 diabetes, diabetes medications and rational behind using different medications, pre- and post-prandial blood glucose recommendations and Hemoglobin A1c goals, diabetes diet, and exercise including blood glucose guidelines for exercising safely.    Portion Distortion:  -Group instruction provided by PowerPoint slides, verbal discussion, written materials, and food models to support subject matter. The instructor gives an explanation of serving size versus portion size, changes in portions sizes over the last 20 years, and what consists of a serving from each food group.   Stress Management:  -Group instruction provided by verbal instruction, video, and written materials to support subject matter.  Instructors review role of stress in heart disease and how to cope with stress positively.     Exercising on Your Own:  -Group instruction provided by verbal instruction, power point, and written materials to support subject.  Instructors discuss benefits of exercise, components of exercise, frequency and  intensity of exercise, and end points for exercise.  Also discuss use of nitroglycerin and activating EMS.  Review options of places to exercise outside of rehab.  Review guidelines for sex with heart disease.   Cardiac Drugs I:  -Group instruction provided by verbal instruction and written materials to support subject.  Instructor reviews cardiac drug classes: antiplatelets, anticoagulants, beta blockers, and statins.  Instructor discusses reasons, side effects, and lifestyle considerations for each  drug class.   Cardiac Drugs II:  -Group instruction provided by verbal instruction and written materials to support subject.  Instructor reviews cardiac drug classes: angiotensin converting enzyme inhibitors (ACE-I), angiotensin II receptor blockers (ARBs), nitrates, and calcium channel blockers.  Instructor discusses reasons, side effects, and lifestyle considerations for each drug class.   Anatomy and Physiology of the Circulatory System:  Group verbal and written instruction and models provide basic cardiac anatomy and physiology, with the coronary electrical and arterial systems. Review of: AMI, Angina, Valve disease, Heart Failure, Peripheral Artery Disease, Cardiac Arrhythmia, Pacemakers, and the ICD.   Other Education:  -Group or individual verbal, written, or video instructions that support the educational goals of the cardiac rehab program.   Holiday Eating Survival Tips:  -Group instruction provided by PowerPoint slides, verbal discussion, and written materials to support subject matter. The instructor gives patients tips, tricks, and techniques to help them not only survive but enjoy the holidays despite the onslaught of food that accompanies the holidays.   Knowledge Questionnaire Score:  Knowledge Questionnaire Score - 10/19/20 1659       Knowledge Questionnaire Score   Pre Score 17/24             Core Components/Risk Factors/Patient Goals at Admission:  Personal Goals and Risk Factors at Admission - 10/15/20 1609       Core Components/Risk Factors/Patient Goals on Admission    Weight Management Yes;Weight Loss    Intervention Weight Management: Develop a combined nutrition and exercise program designed to reach desired caloric intake, while maintaining appropriate intake of nutrient and fiber, sodium and fats, and appropriate energy expenditure required for the weight goal.;Weight Management: Provide education and appropriate resources to help participant work on  and attain dietary goals.;Weight Management/Obesity: Establish reasonable short term and long term weight goals.    Expected Outcomes Short Term: Continue to assess and modify interventions until short term weight is achieved;Long Term: Adherence to nutrition and physical activity/exercise program aimed toward attainment of established weight goal;Understanding recommendations for meals to include 15-35% energy as protein, 25-35% energy from fat, 35-60% energy from carbohydrates, less than 249m of dietary cholesterol, 20-35 gm of total fiber daily;Understanding of distribution of calorie intake throughout the day with the consumption of 4-5 meals/snacks    Diabetes Yes    Intervention Provide education about signs/symptoms and action to take for hypo/hyperglycemia.;Provide education about proper nutrition, including hydration, and aerobic/resistive exercise prescription along with prescribed medications to achieve blood glucose in normal ranges: Fasting glucose 65-99 mg/dL    Expected Outcomes Short Term: Participant verbalizes understanding of the signs/symptoms and immediate care of hyper/hypoglycemia, proper foot care and importance of medication, aerobic/resistive exercise and nutrition plan for blood glucose control.;Long Term: Attainment of HbA1C < 7%.    Hypertension Yes    Intervention Provide education on lifestyle modifcations including regular physical activity/exercise, weight management, moderate sodium restriction and increased consumption of fresh fruit, vegetables, and low fat dairy, alcohol moderation, and smoking cessation.;Monitor prescription use compliance.    Expected Outcomes Short Term: Continued  assessment and intervention until BP is < 140/49m HG in hypertensive participants. < 130/854mHG in hypertensive participants with diabetes, heart failure or chronic kidney disease.;Long Term: Maintenance of blood pressure at goal levels.    Lipids Yes    Intervention Provide education and  support for participant on nutrition & aerobic/resistive exercise along with prescribed medications to achieve LDL <706mHDL >54m24m  Expected Outcomes Short Term: Participant states understanding of desired cholesterol values and is compliant with medications prescribed. Participant is following exercise prescription and nutrition guidelines.;Long Term: Cholesterol controlled with medications as prescribed, with individualized exercise RX and with personalized nutrition plan. Value goals: LDL < 70mg64mL > 40 mg.    Personal Goal Other Yes    Personal Goal Pt wants to gain stregth and balance    Intervention Will continue to monitor pt and progress workloads as tolerated without sign or symptom.    Expected Outcomes Pt will achieve his goals             Core Components/Risk Factors/Patient Goals Review:   Goals and Risk Factor Review     Row Name 10/21/20 1729 11/12/20 1453           Core Components/Risk Factors/Patient Goals Review   Personal Goals Review Weight Management/Obesity;Stress;Hypertension;Lipids;Diabetes Weight Management/Obesity;Stress;Hypertension;Lipids;Diabetes      Review MalcoGurtejted exercise on 10/19/20. Will continue to monitor MalcoJackiebeen doing well with exercise for his fitness level. Jovontae's vital signs have been stable. MalcoShaydonntly bought a rollator which he brought to exercise on 11/11/20.      Expected Outcomes MalcoCristin continue to partcipate in phase 2 cardiac rehab for exercise, nutrtion and lifestyle modifications MalcoNiccolas continue to partcipate in phase 2 cardiac rehab for exercise, nutrtion and lifestyle modifications               Core Components/Risk Factors/Patient Goals at Discharge (Final Review):   Goals and Risk Factor Review - 11/12/20 1453       Core Components/Risk Factors/Patient Goals Review   Personal Goals Review Weight Management/Obesity;Stress;Hypertension;Lipids;Diabetes    Review MalcoLongbeen doing  well with exercise for his fitness level. Stephanos's vital signs have been stable. MalcoKainaluntly bought a rollator which he brought to exercise on 11/11/20.    Expected Outcomes MalcoAkeel continue to partcipate in phase 2 cardiac rehab for exercise, nutrtion and lifestyle modifications             ITP Comments:  ITP Comments     Row Name 10/21/20 1725 10/21/20 1726 11/12/20 1447       ITP Comments Dr TraciFransico HimMedical Director 30 Day ITP Review. MalcoDejohnted exercise on 10/19/20 30 Day ITP Review. MalcoDesmundgood attendance and participation in phase 2 cardiac rehab.              Comments: See ITP comment

## 2020-11-13 ENCOUNTER — Other Ambulatory Visit: Payer: Self-pay

## 2020-11-13 ENCOUNTER — Encounter (HOSPITAL_COMMUNITY)
Admission: RE | Admit: 2020-11-13 | Discharge: 2020-11-13 | Disposition: A | Discharge status: Home / Self Care | Payer: Medicare Other | Source: Ambulatory Visit | Attending: Cardiology | Admitting: Cardiology

## 2020-11-13 DIAGNOSIS — Z955 Presence of coronary angioplasty implant and graft: Secondary | ICD-10-CM | POA: Diagnosis not present

## 2020-11-16 ENCOUNTER — Other Ambulatory Visit: Payer: Self-pay

## 2020-11-16 ENCOUNTER — Encounter (HOSPITAL_COMMUNITY)
Admission: RE | Admit: 2020-11-16 | Discharge: 2020-11-16 | Disposition: A | Discharge status: Home / Self Care | Payer: Medicare Other | Source: Ambulatory Visit | Attending: Cardiology | Admitting: Cardiology

## 2020-11-16 DIAGNOSIS — F330 Major depressive disorder, recurrent, mild: Secondary | ICD-10-CM | POA: Diagnosis not present

## 2020-11-16 DIAGNOSIS — I2510 Atherosclerotic heart disease of native coronary artery without angina pectoris: Secondary | ICD-10-CM | POA: Diagnosis not present

## 2020-11-16 DIAGNOSIS — I701 Atherosclerosis of renal artery: Secondary | ICD-10-CM | POA: Diagnosis not present

## 2020-11-16 DIAGNOSIS — E782 Mixed hyperlipidemia: Secondary | ICD-10-CM | POA: Diagnosis not present

## 2020-11-16 DIAGNOSIS — I10 Essential (primary) hypertension: Secondary | ICD-10-CM | POA: Diagnosis not present

## 2020-11-16 DIAGNOSIS — N1831 Chronic kidney disease, stage 3a: Secondary | ICD-10-CM | POA: Diagnosis not present

## 2020-11-16 DIAGNOSIS — I480 Paroxysmal atrial fibrillation: Secondary | ICD-10-CM | POA: Diagnosis not present

## 2020-11-16 DIAGNOSIS — Z955 Presence of coronary angioplasty implant and graft: Secondary | ICD-10-CM

## 2020-11-16 DIAGNOSIS — E1122 Type 2 diabetes mellitus with diabetic chronic kidney disease: Secondary | ICD-10-CM | POA: Diagnosis not present

## 2020-11-18 ENCOUNTER — Encounter (HOSPITAL_COMMUNITY)
Admission: RE | Admit: 2020-11-18 | Discharge: 2020-11-18 | Disposition: A | Discharge status: Home / Self Care | Payer: Medicare Other | Source: Ambulatory Visit | Attending: Cardiology | Admitting: Cardiology

## 2020-11-18 ENCOUNTER — Other Ambulatory Visit: Payer: Self-pay

## 2020-11-18 DIAGNOSIS — Z955 Presence of coronary angioplasty implant and graft: Secondary | ICD-10-CM

## 2020-11-20 ENCOUNTER — Other Ambulatory Visit: Payer: Self-pay

## 2020-11-20 ENCOUNTER — Encounter (HOSPITAL_COMMUNITY)
Admission: RE | Admit: 2020-11-20 | Discharge: 2020-11-20 | Disposition: A | Discharge status: Home / Self Care | Payer: Medicare Other | Source: Ambulatory Visit | Attending: Cardiology | Admitting: Cardiology

## 2020-11-20 DIAGNOSIS — Z955 Presence of coronary angioplasty implant and graft: Secondary | ICD-10-CM

## 2020-11-23 ENCOUNTER — Encounter (HOSPITAL_COMMUNITY)
Admission: RE | Admit: 2020-11-23 | Discharge: 2020-11-23 | Disposition: A | Discharge status: Home / Self Care | Payer: Medicare Other | Source: Ambulatory Visit | Attending: Cardiology | Admitting: Cardiology

## 2020-11-23 ENCOUNTER — Other Ambulatory Visit: Payer: Self-pay

## 2020-11-23 DIAGNOSIS — Z955 Presence of coronary angioplasty implant and graft: Secondary | ICD-10-CM

## 2020-11-25 ENCOUNTER — Other Ambulatory Visit: Payer: Self-pay

## 2020-11-25 ENCOUNTER — Encounter (HOSPITAL_COMMUNITY)
Admission: RE | Admit: 2020-11-25 | Discharge: 2020-11-25 | Disposition: A | Discharge status: Home / Self Care | Payer: Medicare Other | Source: Ambulatory Visit | Attending: Cardiology | Admitting: Cardiology

## 2020-11-25 DIAGNOSIS — Z955 Presence of coronary angioplasty implant and graft: Secondary | ICD-10-CM

## 2020-11-27 ENCOUNTER — Other Ambulatory Visit: Payer: Self-pay

## 2020-11-27 ENCOUNTER — Encounter (HOSPITAL_COMMUNITY)
Admission: RE | Admit: 2020-11-27 | Discharge: 2020-11-27 | Disposition: A | Discharge status: Home / Self Care | Payer: Medicare Other | Source: Ambulatory Visit | Attending: Cardiology | Admitting: Cardiology

## 2020-11-27 DIAGNOSIS — Z955 Presence of coronary angioplasty implant and graft: Secondary | ICD-10-CM | POA: Diagnosis not present

## 2020-11-30 ENCOUNTER — Encounter (HOSPITAL_COMMUNITY)
Admission: RE | Admit: 2020-11-30 | Discharge: 2020-11-30 | Disposition: A | Discharge status: Home / Self Care | Payer: Medicare Other | Source: Ambulatory Visit | Attending: Cardiology | Admitting: Cardiology

## 2020-11-30 ENCOUNTER — Other Ambulatory Visit: Payer: Self-pay

## 2020-11-30 DIAGNOSIS — Z955 Presence of coronary angioplasty implant and graft: Secondary | ICD-10-CM

## 2020-12-01 ENCOUNTER — Other Ambulatory Visit: Payer: Self-pay | Admitting: Cardiology

## 2020-12-02 ENCOUNTER — Encounter (HOSPITAL_COMMUNITY): Payer: Medicare Other

## 2020-12-02 DIAGNOSIS — I10 Essential (primary) hypertension: Secondary | ICD-10-CM | POA: Diagnosis not present

## 2020-12-04 ENCOUNTER — Encounter (HOSPITAL_COMMUNITY): Payer: Medicare Other

## 2020-12-09 ENCOUNTER — Other Ambulatory Visit: Payer: Self-pay

## 2020-12-09 ENCOUNTER — Encounter (HOSPITAL_COMMUNITY)
Admission: RE | Admit: 2020-12-09 | Discharge: 2020-12-09 | Disposition: A | Discharge status: Home / Self Care | Payer: Medicare Other | Source: Ambulatory Visit | Attending: Cardiology | Admitting: Cardiology

## 2020-12-09 DIAGNOSIS — Z955 Presence of coronary angioplasty implant and graft: Secondary | ICD-10-CM

## 2020-12-11 ENCOUNTER — Other Ambulatory Visit: Payer: Self-pay

## 2020-12-11 ENCOUNTER — Encounter (HOSPITAL_COMMUNITY)
Admission: RE | Admit: 2020-12-11 | Discharge: 2020-12-11 | Disposition: A | Discharge status: Home / Self Care | Payer: Medicare Other | Source: Ambulatory Visit | Attending: Cardiology | Admitting: Cardiology

## 2020-12-11 DIAGNOSIS — Z955 Presence of coronary angioplasty implant and graft: Secondary | ICD-10-CM | POA: Diagnosis not present

## 2020-12-14 ENCOUNTER — Encounter (HOSPITAL_COMMUNITY): Payer: Medicare Other

## 2020-12-14 ENCOUNTER — Telehealth (HOSPITAL_COMMUNITY): Payer: Self-pay | Admitting: Internal Medicine

## 2020-12-15 NOTE — Progress Notes (Signed)
Cardiac Individual Treatment Plan  Patient Details  Name: [name] MRN: [id number] Date of Birth: 1935-06-04 Referring Provider:   Flowsheet Row CARDIAC REHAB PHASE II ORIENTATION from 10/15/2020 in [facility]  Referring Provider Dr. Glenetta Hew, MD       Initial Encounter Date:  Flowsheet Row CARDIAC REHAB PHASE II ORIENTATION from 10/15/2020 in [facility]  Date 10/15/20       Visit Diagnosis: S/P drug eluting coronary stent placement  Patient's Home Medications on Admission:  Current Outpatient Medications:    allopurinol (ZYLOPRIM) 300 MG tablet, Take 1 tablet (300 mg total) by mouth daily., Disp: 30 tablet, Rfl: 1   apixaban (ELIQUIS) 2.5 MG TABS tablet, Take 1 tablet (2.5 mg total) by mouth 2 (two) times daily., Disp: 180 tablet, Rfl: 2   carvedilol (COREG) 6.25 MG tablet, Take 6.25 mg by mouth 2 (two) times daily with a meal., Disp: , Rfl:    chlorthalidone (HYGROTON) 25 MG tablet, Take 12.5 mg by mouth daily., Disp: , Rfl:    Cholecalciferol (VITAMIN D3) 50 MCG (2000 UT) CAPS, Take 2,000 Units by mouth daily. (Patient not taking: Reported on 10/07/2020), Disp: , Rfl:    clopidogrel (PLAVIX) 75 MG tablet, Take 1 tablet (75 mg total) by mouth daily., Disp: 90 tablet, Rfl: 0   diclofenac Sodium (VOLTAREN) 1 % GEL, Apply 2 g topically daily as needed (Pain). (Patient not taking: Reported on 10/07/2020), Disp: , Rfl:    diltiazem (CARDIZEM CD) 240 MG 24 hr capsule, Take 240 mg by mouth daily., Disp: , Rfl:    escitalopram (LEXAPRO) 20 MG tablet, Take 20 mg by mouth daily., Disp: , Rfl:    lidocaine (XYLOCAINE) 5 % ointment, Apply 1 application topically daily as needed for mild pain or moderate pain. (Patient not taking: Reported on 10/07/2020), Disp: , Rfl:    Lidocaine 4 % PTCH, Apply 1 patch topically daily as needed (Pain). (Patient not taking: Reported on 10/07/2020), Disp: , Rfl:    metFORMIN (GLUCOPHAGE) 500 MG  tablet, Take 1 tablet (500 mg total) by mouth 2 (two) times daily with a meal., Disp: , Rfl:    nitroGLYCERIN (NITROSTAT) 0.4 MG SL tablet, Place 1 tablet (0.4 mg total) under the tongue every 5 (five) minutes as needed for chest pain., Disp: 25 tablet, Rfl: 2   ONETOUCH ULTRA test strip, USE TO TEST BLOOD SUGAR ONCE OR TWICE A DAY (E11.22), Disp: , Rfl:    oxybutynin (DITROPAN XL) 15 MG 24 hr tablet, Take 1 tablet (15 mg total) by mouth at bedtime., Disp: 30 tablet, Rfl: 1   rosuvastatin (CRESTOR) 20 MG tablet, Take 20 mg by mouth every evening., Disp: , Rfl:    sertraline (ZOLOFT) 50 MG tablet, Take 50 mg by mouth daily. (Patient not taking: No sig reported), Disp: , Rfl:    tamsulosin (FLOMAX) 0.4 MG CAPS capsule, Take 1 capsule (0.4 mg total) by mouth at bedtime., Disp: 30 capsule, Rfl: 1   telmisartan (MICARDIS) 40 MG tablet, Take 1 tablet (40 mg total) by mouth daily., Disp: 90 tablet, Rfl: 3  Past Medical History: Past Medical History:  Diagnosis Date   Arthritis    CAD S/P percutaneous coronary angioplasty 08/2004   CARDIOLOGIST-  DR DAVID HARDING; DES to LCx. X2  - Cypher 2.5 mm postdilated to 2.75 mm (20 mm and 13 mm stents);; Myoview 09/2014: LOW RISK. No Infarct or Ischemia.   Colon polyps 05/10/2005  Hyperplastic polyps   Complication of anesthesia    "takes long time to wake up"   Depression    Diabetes mellitus type 2 with complications ([place])    neuropathy; CAD borderline- no med   Diastolic dysfunction without heart failure    Moderate LVH, Gr 1 DD on Echo 2011; no CHF admissions, no tt on diuretisc   Essential hypertension    H/O: gout STABLE   History of melanoma excision 2010   FOREHEAD   History of prostate cancer 2004--  S/P SEED IMPLANTS    NO RECURRENCE   Hydrocele, left    Hyperlipidemia LDL goal <70    Impaired hearing RIGHT HEARING AID   OSA on CPAP    bipap   PAF (paroxysmal atrial fibrillation) ([place]) 12/11/2017   Admitted with Afib RVR - converted on IV  Diltiazem. d/c on Eliquis; CHA2DS2Vasc = 5.   Prostate cancer [facility])    prostate , melanoma head   Shingles 07   Syncope and collapse March 2017   Likely related to post micturition, Neurontin and dehydration with orthostatic hypotension   Urgency of urination     Tobacco Use: Social History   Tobacco Use  Smoking Status Never  Smokeless Tobacco Never  Tobacco Comments   occ alcohol    Labs: Recent Review Flowsheet Data     Labs for ITP Cardiac and Pulmonary Rehab Latest Ref Rng & Units 10/02/2012 04/16/2013 12/01/2014 08/03/2020 08/05/2020   Cholestrol 0 - 200 mg/dL 145 112 - - 106   LDLCALC 0 - 99 mg/dL 78 52 - - 44   HDL >40 mg/dL 36(L) 31(L) - - 38(L)   Trlycerides <150 mg/dL 157(H) 143 - - 121   Hemoglobin A1c 4.8 - 5.6 % - - 6.9(H) 6.2(H) -       Capillary Blood Glucose: Lab Results  Component Value Date   GLUCAP 108 (H) 10/23/2020   GLUCAP 148 (H) 10/23/2020   GLUCAP 92 10/21/2020   GLUCAP 91 10/19/2020   GLUCAP 98 10/19/2020     Exercise Target Goals: Exercise Program Goal: Individual exercise prescription set using results from initial 6 min walk test and THRR while considering  patient's activity barriers and safety.   Exercise Prescription Goal: Starting with aerobic activity 30 plus minutes a day, 3 days per week for initial exercise prescription. Provide home exercise prescription and guidelines that participant acknowledges understanding prior to discharge.  Activity Barriers & Risk Stratification:  Activity Barriers & Cardiac Risk Stratification - 10/15/20 1603       Activity Barriers & Cardiac Risk Stratification   Activity Barriers Neck/Spine Problems;Back Problems;Joint Problems;Balance Concerns;History of Falls;Assistive Device    Cardiac Risk Stratification High             6 Minute Walk:  6 Minute Walk     Row Name 10/15/20 1601         6 Minute Walk   Phase Initial  STEPPER TEST     Distance 1378 feet     Walk Time 6 minutes      # of Rest Breaks 0     MPH 2.61     METS 1.27     RPE 11     Perceived Dyspnea  0     VO2 Peak 4.45     Symptoms No     Resting HR 58 bpm     Resting BP 100/52     Resting Oxygen Saturation  98 %  Exercise Oxygen Saturation  during 6 min walk 98 %     Max Ex. HR 56 bpm     Max Ex. BP 130/52     2 Minute Post BP 120/52              Oxygen Initial Assessment:   Oxygen Re-Evaluation:   Oxygen Discharge (Final Oxygen Re-Evaluation):   Initial Exercise Prescription:  Initial Exercise Prescription - 10/15/20 1600       Date of Initial Exercise RX and Referring Provider   Date 10/15/20    Referring Provider Dr. Glenetta Hew, MD    Expected Discharge Date 12/11/20      NuStep   Level 1    SPM 70    Minutes 25    METs 1.4      Prescription Details   Frequency (times per week) 3    Duration Progress to 30 minutes of continuous aerobic without signs/symptoms of physical distress      Intensity   THRR 40-80% of Max Heartrate 55-110    Ratings of Perceived Exertion 11-13    Perceived Dyspnea 0-4      Progression   Progression Continue progressive overload as per policy without signs/symptoms or physical distress.      Resistance Training   Training Prescription Yes    Weight 2    Reps 10-15             Perform Capillary Blood Glucose checks as needed.  Exercise Prescription Changes:   Exercise Prescription Changes     Row Name 10/19/20 1643 10/30/20 1630 11/13/20 1600 11/30/20 1630 12/11/20 1645     Response to Exercise   Blood Pressure (Admit) 118/60 102/50 114/64 120/58 100/52   Blood Pressure (Exercise) 118/56 110/50 122/68 130/58 100/44   Blood Pressure (Exit) 102/50 104/62 110/50 124/50 110/50   Heart Rate (Admit) 73 bpm 63 bpm 48 bpm 67 bpm 54 bpm   Heart Rate (Exercise) 79 bpm 61 bpm 62 bpm 86 bpm 78 bpm   Heart Rate (Exit) 63 bpm 54 bpm 53 bpm 63 bpm 52 bpm   Rating of Perceived Exertion (Exercise) _0 Symptoms none none  none none none   Comments Pt first day of exercise Reviewed MET's goals and home ExRx Reviewed MET's Reviewed MET's and goals Reviewed MET's   Duration Progress to 30 minutes of  aerobic without signs/symptoms of physical distress Progress to 30 minutes of  aerobic without signs/symptoms of physical distress Progress to 30 minutes of  aerobic without signs/symptoms of physical distress Progress to 30 minutes of  aerobic without signs/symptoms of physical distress Progress to 30 minutes of  aerobic without signs/symptoms of physical distress   Intensity _1      Progression   Progression Continue to progress workloads to maintain intensity without signs/symptoms of physical distress. Continue to progress workloads to maintain intensity without signs/symptoms of physical distress. Continue to progress workloads to maintain intensity without signs/symptoms of physical distress. Continue to progress workloads to maintain intensity without signs/symptoms of physical distress. Continue to progress workloads to maintain intensity without signs/symptoms of physical distress.   Average METs 1.7 1.8 1.8 1.8 1.8     Resistance Training   Training Prescription _2    Weight _3 Reps 10-15 10-15 10-15 10-15 10-15   Time 10 Minutes 10 Minutes 10 Minutes 10 Minutes 10 Minutes  Interval Training   Interval Training -- -- No No No     NuStep   Level _0 SPM 70 70 70 70 70   Minutes _1 METs 1.7 1.8 1.8 1.8 1.8     Home Exercise Plan   Plans to continue exercise at -- [employer] (comment) Forensic scientist (comment) Forensic scientist (comment) Forensic scientist (comment)   Frequency -- Add 1 additional day to program exercise sessions. Add 1 additional day to program exercise sessions. Add 1 additional day to program exercise sessions. Add 1 additional day to program exercise  sessions.   Initial Home Exercises Provided -- 10/30/20 10/30/20 10/30/20 10/30/20            Exercise Comments:   Exercise Comments     Row Name 10/19/20 1647 10/30/20 1630 11/13/20 1635 11/30/20 1630 12/11/20 1645   Exercise Comments Pt first day in the CRP2 program. Pt tolerated exercise well without sign or symptom Reviewed MET's, goals and home Ex Rx with pt. Pt tolerated exercise well with an average MET level of 1.8. Pt was apprehensive to add addional days to his cardiac rehab sessions, but stated he would be willing to do one addional day with his silver sneakers club. Pt states he is feeling like it is early to tell about his goals, but he did decide to order a walker to help with his balance and he hopes with time he will gain more strength and endurance. Reviewed MET's with pt today. Pt is tolerating exercise well with an average MET level of 1.8. Will continue to monitor pt and progress workloads as tolerated without sign or symptom. Reviewed MET's and goals. Pt tolerated exercise well with an average MET level of 1.8. Pt states he is doing well with his goals of increasing strength, he did decide to use a walker and he hopes with time he will gain more balance. Will continue to monitor pt and progress workloads as tolerated without sign or symptom. Reviewed MET's with pt today. Pt tolerated exercise well with an average MET level of 1.8. Will continue to monitor pt and progress workloads as tolerated without sign or symptom.            Exercise Goals and Review:   Exercise Goals     Row Name 10/15/20 1606             Exercise Goals   Increase Physical Activity Yes       Intervention Provide advice, education, support and counseling about physical activity/exercise needs.;Develop an individualized exercise prescription for aerobic and resistive training based on initial evaluation findings, risk stratification, comorbidities and participant's personal goals.        Expected Outcomes Short Term: Attend rehab on a regular basis to increase amount of physical activity.;Long Term: Add in home exercise to make exercise part of routine and to increase amount of physical activity.;Long Term: Exercising regularly at least 3-5 days a week.       Increase Strength and Stamina Yes       Intervention Provide advice, education, support and counseling about physical activity/exercise needs.;Develop an individualized exercise prescription for aerobic and resistive training based on initial evaluation findings, risk stratification, comorbidities and participant's personal goals.       Expected Outcomes Short Term: Increase workloads from initial exercise prescription for resistance, speed, and METs.;Short Term: Perform resistance training exercises routinely during rehab and add in resistance training at  home;Long Term: Improve cardiorespiratory fitness, muscular endurance and strength as measured by increased METs and functional capacity (6MWT)       Able to understand and use rate of perceived exertion (RPE) scale Yes       Intervention Provide education and explanation on how to use RPE scale       Expected Outcomes Short Term: Able to use RPE daily in rehab to express subjective intensity level;Long Term:  Able to use RPE to guide intensity level when exercising independently       Knowledge and understanding of Target Heart Rate Range (THRR) Yes       Intervention Provide education and explanation of THRR including how the numbers were predicted and where they are located for reference       Expected Outcomes Short Term: Able to state/look up THRR;Long Term: Able to use THRR to govern intensity when exercising independently;Short Term: Able to use daily as guideline for intensity in rehab       Understanding of Exercise Prescription Yes       Intervention Provide education, explanation, and written materials on patient's individual exercise prescription       Expected  Outcomes Short Term: Able to explain program exercise prescription;Long Term: Able to explain home exercise prescription to exercise independently                Exercise Goals Re-Evaluation :  Exercise Goals Re-Evaluation     Row Name 10/19/20 1645 10/30/20 1630 11/30/20 1630         Exercise Goal Re-Evaluation   Exercise Goals Review Increase Physical Activity;Increase Strength and Stamina;Able to understand and use rate of perceived exertion (RPE) scale;Knowledge and understanding of Target Heart Rate Range (THRR);Understanding of Exercise Prescription Increase Physical Activity;Increase Strength and Stamina;Able to understand and use rate of perceived exertion (RPE) scale;Knowledge and understanding of Target Heart Rate Range (THRR);Understanding of Exercise Prescription Increase Physical Activity;Increase Strength and Stamina;Able to understand and use rate of perceived exertion (RPE) scale;Knowledge and understanding of Target Heart Rate Range (THRR);Understanding of Exercise Prescription     Comments Pt first day in the CRP2 program. Pt tolerated exercise well and is learning his THRR, RPE and exercise Rx. Reviewed MET's, goals and home Ex Rx with pt. Pt tolerated exercise well with an average MET level of 1.8. Pt was apprehensive to add addional days to his cardiac rehab sessions, but stated he would be willing to do one addional day with his silver sneakers club. Pt states he is feeling like it is early to tell about his goals, but he did decide to order a walker to help with his balance and he hopes with time he will gain more strength and endurance. Reviewed MET's and goals. Pt tolerated exercise well with an average MET level of 1.8. Pt states he is doing well with his goals of increasing strength, he did decide to use a walker and he hopes with time he will gain more balance     Expected Outcomes Will contine to monitor pt and progress workloads as tolerated without sign or symptom pt  will add one day of exercise with silver sneakers for 30-45 minutes Will contine to monitor pt and progress workloads as tolerated without sign or symptom Will continue to monitor pt and progress workloads as tolerated without sign or symptom               Discharge Exercise Prescription (Final Exercise Prescription Changes):  Exercise Prescription Changes - 12/11/20  1645       Response to Exercise   Blood Pressure (Admit) 100/52    Blood Pressure (Exercise) 100/44    Blood Pressure (Exit) 110/50    Heart Rate (Admit) 54 bpm    Heart Rate (Exercise) 78 bpm    Heart Rate (Exit) 52 bpm    Rating of Perceived Exertion (Exercise) 11    Symptoms none    Comments Reviewed MET's    Duration Progress to 30 minutes of  aerobic without signs/symptoms of physical distress    Intensity THRR unchanged      Progression   Progression Continue to progress workloads to maintain intensity without signs/symptoms of physical distress.    Average METs 1.8      Resistance Training   Training Prescription Yes    Weight 3    Reps 10-15    Time 10 Minutes      Interval Training   Interval Training No      NuStep   Level 2    SPM 70    Minutes 30    METs 1.8      Home Exercise Plan   Plans to continue exercise at [facility] (comment)    Frequency Add 1 additional day to program exercise sessions.    Initial Home Exercises Provided 10/30/20             Nutrition:  Target Goals: Understanding of nutrition guidelines, daily intake of sodium <1590m, cholesterol <2055m calories 30% from fat and 7% or less from saturated fats, daily to have 5 or more servings of fruits and vegetables.  Biometrics:  Pre Biometrics - 10/15/20 1558       Pre Biometrics   Waist Circumference 40.25 inches    Hip Circumference 41 inches    Waist to Hip Ratio 0.98 %    Triceps Skinfold 21 mm    % Body Fat 35.5 %    Grip Strength 39 kg    Flexibility --   Not done. Spine injury   Single Leg  Stand --   Not done. Spine injury             Nutrition Therapy Plan and Nutrition Goals:  Nutrition Therapy & Goals - 10/27/20 1028       Nutrition Therapy   Diet TLC    Drug/Food Interactions Statins/Certain Fruits      Personal Nutrition Goals   Nutrition Goal Pt to build a healthy plate including vegetables, fruits, whole grains, and low-fat dairy products in a heart healthy meal plan.      Intervention Plan   Intervention Prescribe, educate and counsel regarding individualized specific dietary modifications aiming towards targeted core components such as weight, hypertension, lipid management, diabetes, heart failure and other comorbidities.    Expected Outcomes Short Term Goal: Understand basic principles of dietary content, such as calories, fat, sodium, cholesterol and nutrients.             Nutrition Assessments:  MEDIFICTS Score Key: ?70 Need to make dietary changes  40-70 Heart Healthy Diet ? 40 Therapeutic Level Cholesterol Diet  Flowsheet Row CARDIAC REHAB PHASE [facility] [place] on [place] 57      Picture Your Plate Scores: <4<78nhealthy dietary pattern with much room for improvement. 41-50 Dietary pattern unlikely to meet recommendations for good health and room for improvement. 51-60 More healthful dietary pattern, with some room for improvement.  >60  Healthy dietary pattern, although there may be some specific behaviors that could be improved.    Nutrition Goals Re-Evaluation:  Nutrition Goals Re-Evaluation     [place] Name 10/27/20 1028 11/12/20 1136 12/14/20 1007         Goals   Current Weight 187 lb (84.8 kg) 192 lb 0.3 oz (87.1 kg) 193 lb 2 oz (87.6 kg)     Nutrition Goal -- Pt to build a healthy plate including vegetables, fruits, whole grains, and low-fat dairy products in a heart healthy meal plan. Pt to build a healthy plate including vegetables,  fruits, whole grains, and low-fat dairy products in a heart healthy meal plan.              Nutrition Goals Discharge (Final Nutrition Goals Re-Evaluation):  Nutrition Goals Re-Evaluation - 12/14/20 1007       Goals   Current Weight 193 lb 2 oz (87.6 kg)    Nutrition Goal Pt to build a healthy plate including vegetables, fruits, whole grains, and low-fat dairy products in a heart healthy meal plan.             Psychosocial: Target Goals: Acknowledge presence or absence of significant depression and/or stress, maximize coping skills, provide positive support system. Participant is able to verbalize types and ability to use techniques and skills needed for reducing stress and depression.  Initial Review & Psychosocial Screening:  Initial Psych Review & Screening - 10/16/20 1108       Initial Review   Current issues with Current Depression;Current Sleep Concerns      Family Dynamics   Good Support System? Yes   supportive wife   Comments Shared feels down each day but knows to press on and do things. He no longer plays golf and not able to be as active as he used to be so this impacts him. However he does continue to have lunch with friends and he and his wife strive to continue to engage with family for outings. He shared he does not have the appetite he has had in prior years as given his diet had an impact on his health being that he no longer eats red meat and drinks alcohol due to a severe case of gout in his past. He reports being put on a new medication for depression a couple weeks ago but did not have that current medication on his list but he said he will get it to [place] when he starts next week.      Barriers   Psychosocial barriers to participate in program There are no identifiable barriers or psychosocial needs.   He said he does not foresee issues in participation. His wife supports him participating in the program.     Screening Interventions   Interventions  Encouraged to exercise;To provide support and resources with identified psychosocial needs;Other (comment)    Comments Check with [facility] to ensure he has found a Social worker. He shared Dr. Laurann Montana provided a list and Ersel said he is currently in the process of calls.    Expected Outcomes Short Term goal: Identification and review with participant of any Quality of Life or Depression concerns found by scoring the questionnaire.;Long Term goal: The participant improves quality of Life and PHQ9 Scores as seen by post scores and/or verbalization of changes             Quality of Life Scores:  Quality of Life - 10/19/20 1703       Quality of Life  Select Quality of Life      Quality of Life Scores   Health/Function Pre 11.83 %    Socioeconomic Pre 16.13 %    Psych/Spiritual Pre 9.86 %    Family Pre 21.2 %    GLOBAL Pre 13.76 %            Scores of 19 and below usually indicate a poorer quality of life in these areas.  A difference of  2-3 points is a clinically meaningful difference.  A difference of 2-3 points in the total score of the Quality of Life Index has been associated with significant improvement in overall quality of life, self-image, physical symptoms, and general health in studies assessing change in quality of life.  PHQ-9: Recent Review Flowsheet Data     Depression screen [facility] 2/9 10/15/2020 02/03/2015 08/18/2013   Decreased Interest 0 0 0   Down, Depressed, Hopeless 3  1 0   PHQ - 2 Score 3 1 0   Altered sleeping 3 - -   Tired, decreased energy 3 - -   Change in appetite 3 - -   Feeling bad or failure about yourself  0 - -   Trouble concentrating 0 - -   Moving slowly or fidgety/restless 0 - -   Suicidal thoughts (No Data)  - -   PHQ-9 Score 12 - -   Difficult doing work/chores Not difficult at all - -      Interpretation of Total Score  Total Score Depression Severity:  1-4 = Minimal depression, 5-9 = Mild depression, 10-14 = Moderate depression, 15-19  = Moderately severe depression, 20-27 = Severe depression   Psychosocial Evaluation and Intervention:   Psychosocial Re-Evaluation:  Psychosocial Re-Evaluation     Row Name 11/12/20 1450 12/15/20 1621           Psychosocial Re-Evaluation   Current issues with Current Sleep Concerns;Current Depression Current Sleep Concerns;Current Depression      Comments Kayd has not voiced any increased depression or sleep concerns while partcipating in phase 2 CR Jhamal has not voiced any increased depression or sleep concerns while partcipating in phase 2 CR      Expected Outcomes Rayne will have controlled depression/ sleep concerns upon completion of phase 2 CR Ahmadou will have controlled depression/ sleep concerns upon completion of phase 2 CR      Interventions Encouraged to attend Cardiac Rehabilitation for the exercise Encouraged to attend Cardiac Rehabilitation for the exercise      Continue Psychosocial Services  No Follow up required No Follow up required               Psychosocial Discharge (Final Psychosocial Re-Evaluation):  Psychosocial Re-Evaluation - 12/15/20 1621       Psychosocial Re-Evaluation   Current issues with Current Sleep Concerns;Current Depression    Comments Aikeem has not voiced any increased depression or sleep concerns while partcipating in phase 2 CR    Expected Outcomes Thales will have controlled depression/ sleep concerns upon completion of phase 2 CR    Interventions Encouraged to attend Cardiac Rehabilitation for the exercise    Continue Psychosocial Services  No Follow up required             Vocational Rehabilitation: Provide vocational rehab assistance to qualifying candidates.   Vocational Rehab Evaluation & Intervention:  Vocational Rehab - 10/21/20 1731       Initial Vocational Rehab Evaluation & Intervention   Assessment shows need for Vocational Rehabilitation No  Jayvon is retired and does not need vocational rehab at this  time            Education: Education Goals: Education classes will be provided on a weekly basis, covering required topics. Participant will state understanding/return demonstration of topics presented.  Learning Barriers/Preferences:  Learning Barriers/Preferences - 10/15/20 1608       Learning Barriers/Preferences   Learning Barriers Hearing    Learning Preferences Audio;Group Instruction;Skilled Demonstration;Verbal Instruction;Video             Education Topics: Hypertension, Hypertension Reduction -Define heart disease and high blood pressure. Discus how high blood pressure affects the body and ways to reduce high blood pressure.   Exercise and Your Heart -Discuss why it is important to exercise, the FITT principles of exercise, normal and abnormal responses to exercise, and how to exercise safely.   Angina -Discuss definition of angina, causes of angina, treatment of angina, and how to decrease risk of having angina.   Cardiac Medications -Review what the following cardiac medications are used for, how they affect the body, and side effects that may occur when taking the medications.  Medications include Aspirin, Beta blockers, calcium channel blockers, ACE Inhibitors, angiotensin receptor blockers, diuretics, digoxin, and antihyperlipidemics.   Congestive Heart Failure -Discuss the definition of CHF, how to live with CHF, the signs and symptoms of CHF, and how keep track of weight and sodium intake.   Heart Disease and Intimacy -Discus the effect sexual activity has on the heart, how changes occur during intimacy as we age, and safety during sexual activity.   Smoking Cessation / COPD -Discuss different methods to quit smoking, the health benefits of quitting smoking, and the definition of COPD.   Nutrition I: Fats -Discuss the types of cholesterol, what cholesterol does to the heart, and how cholesterol levels can be controlled.   Nutrition II:  Labels -Discuss the different components of food labels and how to read food label   Heart Parts/Heart Disease and PAD -Discuss the anatomy of the heart, the pathway of blood circulation through the heart, and these are affected by heart disease.   Stress I: Signs and Symptoms -Discuss the causes of stress, how stress may lead to anxiety and depression, and ways to limit stress.   Stress II: Relaxation -Discuss different types of relaxation techniques to limit stress.   Warning Signs of Stroke / TIA -Discuss definition of a stroke, what the signs and symptoms are of a stroke, and how to identify when someone is having stroke.   Knowledge Questionnaire Score:  Knowledge Questionnaire Score - 10/19/20 1659       Knowledge Questionnaire Score   Pre Score 17/24             Core Components/Risk Factors/Patient Goals at Admission:  Personal Goals and Risk Factors at Admission - 10/15/20 1609       Core Components/Risk Factors/Patient Goals on Admission    Weight Management Yes;Weight Loss    Intervention Weight Management: Develop a combined nutrition and exercise program designed to reach desired caloric intake, while maintaining appropriate intake of nutrient and fiber, sodium and fats, and appropriate energy expenditure required for the weight goal.;Weight Management: Provide education and appropriate resources to help participant work on and attain dietary goals.;Weight Management/Obesity: Establish reasonable short term and long term weight goals.    Expected Outcomes Short Term: Continue to assess and modify interventions until short term weight is achieved;Long Term: Adherence to nutrition and physical activity/exercise program aimed  toward attainment of established weight goal;Understanding recommendations for meals to include 15-35% energy as protein, 25-35% energy from fat, 35-60% energy from carbohydrates, less than 227m of dietary cholesterol, 20-35 gm of total fiber  daily;Understanding of distribution of calorie intake throughout the day with the consumption of 4-5 meals/snacks    Diabetes Yes    Intervention Provide education about signs/symptoms and action to take for hypo/hyperglycemia.;Provide education about proper nutrition, including hydration, and aerobic/resistive exercise prescription along with prescribed medications to achieve blood glucose in normal ranges: Fasting glucose 65-99 mg/dL    Expected Outcomes Short Term: Participant verbalizes understanding of the signs/symptoms and immediate care of hyper/hypoglycemia, proper foot care and importance of medication, aerobic/resistive exercise and nutrition plan for blood glucose control.;Long Term: Attainment of HbA1C < 7%.    Hypertension Yes    Intervention Provide education on lifestyle modifcations including regular physical activity/exercise, weight management, moderate sodium restriction and increased consumption of fresh fruit, vegetables, and low fat dairy, alcohol moderation, and smoking cessation.;Monitor prescription use compliance.    Expected Outcomes Short Term: Continued assessment and intervention until BP is < 140/93mHG in hypertensive participants. < 130/805mG in hypertensive participants with diabetes, heart failure or chronic kidney disease.;Long Term: Maintenance of blood pressure at goal levels.    Lipids Yes    Intervention Provide education and support for participant on nutrition & aerobic/resistive exercise along with prescribed medications to achieve LDL <71m19mDL >40mg42m Expected Outcomes Short Term: Participant states understanding of desired cholesterol values and is compliant with medications prescribed. Participant is following exercise prescription and nutrition guidelines.;Long Term: Cholesterol controlled with medications as prescribed, with individualized exercise RX and with personalized nutrition plan. Value goals: LDL < 71mg,59m > 40 mg.    Personal Goal Other  Yes    Personal Goal Pt wants to gain stregth and balance    Intervention Will continue to monitor pt and progress workloads as tolerated without sign or symptom.    Expected Outcomes Pt will achieve his goals             Core Components/Risk Factors/Patient Goals Review:   Goals and Risk Factor Review     Row Name 10/21/20 1729 11/12/20 1453 12/15/20 1621         Core Components/Risk Factors/Patient Goals Review   Personal Goals Review Weight Management/Obesity;Stress;Hypertension;Lipids;Diabetes Weight Management/Obesity;Stress;Hypertension;Lipids;Diabetes Weight Management/Obesity;Stress;Hypertension;Lipids;Diabetes     Review MalcolArbyed exercise on 10/19/20. Will continue to monitor MalcolSohileen doing well with exercise for his fitness level. Rakin's vital signs have been stable. MalcolJozephtly bought a rollator which he brought to exercise on 11/11/20. MalcolDariuseen doing well with exercise for his fitness level. Izmael's vital signs have been stable. MalcolKints feeling stronger since he has been participating in phase 2 cardiac rehab. MalcolNasricomplete phase 2 cardiac rehab on 12/25/20     Expected Outcomes MalcolMicheilcontinue to partcipate in phase 2 cardiac rehab for exercise, nutrtion and lifestyle modifications MalcolAzaryahcontinue to partcipate in phase 2 cardiac rehab for exercise, nutrtion and lifestyle modifications MalcolNishaancontinue to partcipate in phase 2 cardiac rehab for exercise, nutrtion and lifestyle modifications              Core Components/Risk Factors/Patient Goals at Discharge (Final Review):   Goals and Risk Factor Review - 12/15/20 1621       Core Components/Risk Factors/Patient Goals Review   Personal Goals Review Weight Management/Obesity;Stress;Hypertension;Lipids;Diabetes    Review MalcolNorberto Sorenson  has been doing well with exercise for his fitness level. Mikah's vital signs have been stable. Aki reports feeling stronger  since he has been participating in phase 2 cardiac rehab. Hridhaan will complete phase 2 cardiac rehab on 12/25/20    Expected Outcomes Tyquon will continue to partcipate in phase 2 cardiac rehab for exercise, nutrtion and lifestyle modifications             ITP Comments:  ITP Comments     Row Name 10/21/20 1725 10/21/20 1726 11/12/20 1447 12/15/20 1620     ITP Comments Dr Fransico Him MD, Medical Director 30 Day ITP Review. Deaveon started exercise on 10/19/20 30 Day ITP Review. Dantre has good attendance and participation in phase 2 cardiac rehab. 30 Day ITP Review. Oiva has good attendance and participation in phase 2 cardiac rehab for his fitness level.             Comments: See ITP comments.Harrell Gave RN BSN

## 2020-12-16 ENCOUNTER — Encounter (HOSPITAL_COMMUNITY): Payer: Medicare Other

## 2020-12-16 ENCOUNTER — Telehealth (HOSPITAL_COMMUNITY): Payer: Self-pay | Admitting: Internal Medicine

## 2020-12-17 DIAGNOSIS — Z23 Encounter for immunization: Secondary | ICD-10-CM | POA: Diagnosis not present

## 2020-12-18 ENCOUNTER — Other Ambulatory Visit: Payer: Self-pay

## 2020-12-18 ENCOUNTER — Encounter (HOSPITAL_COMMUNITY)
Admission: RE | Admit: 2020-12-18 | Discharge: 2020-12-18 | Disposition: A | Discharge status: Home / Self Care | Payer: Medicare Other | Source: Ambulatory Visit | Attending: Cardiology | Admitting: Cardiology

## 2020-12-18 VITALS — Ht 68.39 in | Wt 187.8 lb

## 2020-12-18 DIAGNOSIS — Z955 Presence of coronary angioplasty implant and graft: Secondary | ICD-10-CM

## 2020-12-21 ENCOUNTER — Encounter (HOSPITAL_COMMUNITY)
Admission: RE | Admit: 2020-12-21 | Discharge: 2020-12-21 | Disposition: A | Discharge status: Home / Self Care | Payer: Medicare Other | Source: Ambulatory Visit | Attending: Cardiology | Admitting: Cardiology

## 2020-12-21 ENCOUNTER — Other Ambulatory Visit: Payer: Self-pay

## 2020-12-21 DIAGNOSIS — Z955 Presence of coronary angioplasty implant and graft: Secondary | ICD-10-CM

## 2020-12-22 ENCOUNTER — Inpatient Hospital Stay (HOSPITAL_COMMUNITY): Admission: RE | Admit: 2020-12-22 | Payer: Medicare Other | Source: Ambulatory Visit

## 2020-12-22 ENCOUNTER — Other Ambulatory Visit: Payer: Self-pay | Admitting: Cardiology

## 2020-12-22 DIAGNOSIS — Z23 Encounter for immunization: Secondary | ICD-10-CM | POA: Diagnosis not present

## 2020-12-23 ENCOUNTER — Encounter (HOSPITAL_COMMUNITY): Payer: Medicare Other

## 2020-12-23 ENCOUNTER — Encounter (HOSPITAL_COMMUNITY)
Admission: RE | Admit: 2020-12-23 | Discharge: 2020-12-23 | Disposition: A | Discharge status: Home / Self Care | Payer: Medicare Other | Source: Ambulatory Visit | Attending: Cardiology | Admitting: Cardiology

## 2020-12-23 ENCOUNTER — Ambulatory Visit (HOSPITAL_COMMUNITY): Payer: Medicare Other

## 2020-12-23 ENCOUNTER — Other Ambulatory Visit: Payer: Self-pay

## 2020-12-23 DIAGNOSIS — Z955 Presence of coronary angioplasty implant and graft: Secondary | ICD-10-CM | POA: Diagnosis not present

## 2020-12-25 ENCOUNTER — Inpatient Hospital Stay (HOSPITAL_COMMUNITY): Admission: RE | Admit: 2020-12-25 | Payer: Medicare Other | Source: Ambulatory Visit

## 2020-12-25 ENCOUNTER — Other Ambulatory Visit: Payer: Self-pay

## 2020-12-25 ENCOUNTER — Encounter (HOSPITAL_COMMUNITY): Payer: Medicare Other

## 2020-12-25 ENCOUNTER — Encounter (HOSPITAL_COMMUNITY)
Admission: RE | Admit: 2020-12-25 | Discharge: 2020-12-25 | Disposition: A | Discharge status: Home / Self Care | Payer: Medicare Other | Source: Ambulatory Visit | Attending: Cardiology | Admitting: Cardiology

## 2020-12-25 DIAGNOSIS — Z955 Presence of coronary angioplasty implant and graft: Secondary | ICD-10-CM

## 2020-12-25 NOTE — Progress Notes (Signed)
Discharge Progress Report  Patient Details  Name: [name] MRN: [id number] Date of Birth: 08/30/1935 Referring Provider:   Flowsheet Row CARDIAC REHAB PHASE II ORIENTATION from 10/15/2020 in [place]  Referring Provider Dr. Glenetta Hew, MD        Number of Visits: 24  Reason for Discharge:  Patient reached a stable level of exercise. Patient has met program and personal goals.  Smoking History:  Social History   Tobacco Use  Smoking Status Never  Smokeless Tobacco Never  Tobacco Comments   occ alcohol    Diagnosis:  S/P drug eluting coronary stent placement  ADL UCSD:   Initial Exercise Prescription:  Initial Exercise Prescription - 10/15/20 1600       Date of Initial Exercise RX and Referring Provider   Date 10/15/20    Referring Provider Dr. Glenetta Hew, MD    Expected Discharge Date 12/11/20      NuStep   Level 1    SPM 70    Minutes 25    METs 1.4      Prescription Details   Frequency (times per week) 3    Duration Progress to 30 minutes of continuous aerobic without signs/symptoms of physical distress      Intensity   THRR 40-80% of Max Heartrate 55-110    Ratings of Perceived Exertion 11-13    Perceived Dyspnea 0-4      Progression   Progression Continue progressive overload as per policy without signs/symptoms or physical distress.      Resistance Training   Training Prescription Yes    Weight 2    Reps 10-15             Discharge Exercise Prescription (Final Exercise Prescription Changes):  Exercise Prescription Changes - 12/25/20 1624       Response to Exercise   Blood Pressure (Admit) 130/60    Blood Pressure (Exercise) 120/60    Blood Pressure (Exit) 132/58    Heart Rate (Admit) 62 bpm    Heart Rate (Exercise) 63 bpm    Heart Rate (Exit) 53 bpm    Rating of Perceived Exertion (Exercise) 12    Symptoms none    Comments Pt graduated the CRP2 program    Duration Progress to 30  minutes of  aerobic without signs/symptoms of physical distress    Intensity THRR unchanged      Progression   Progression Continue to progress workloads to maintain intensity without signs/symptoms of physical distress.    Average METs 1.9      Resistance Training   Training Prescription Yes    Weight 3    Reps 10-15    Time 10 Minutes      Interval Training   Interval Training No      NuStep   Level 3    SPM 70    Minutes 30    METs 1.9      Home Exercise Plan   Plans to continue exercise at [facility] (comment)    Frequency Add 1 additional day to program exercise sessions.    Initial Home Exercises Provided 10/30/20             Functional Capacity:  6 Minute Walk     Row Name 10/15/20 1601 12/18/20 1635       6 Minute Walk   Phase Initial  STEPPER TEST Discharge  Stepper test    Distance 1378 feet 1378 feet    Distance %  Change -- 0 %    Distance Feet Change -- 0 ft    Walk Time 6 minutes 6 minutes    # of Rest Breaks 0 0    MPH 2.61 2.61    METS 1.27 1.89    RPE 11 11    Perceived Dyspnea  0 0    VO2 Peak 4.45 6.61    Symptoms No No    Resting HR 58 bpm 57 bpm    Resting BP 100/52 120/60    Resting Oxygen Saturation  98 % 98 %    Exercise Oxygen Saturation  during 6 min walk 98 % 97 %    Max Ex. HR 56 bpm 61 bpm    Max Ex. BP 130/52 110/60    2 Minute Post BP 120/52 106/54             Psychological, QOL, Others - Outcomes: PHQ 2/9: Depression screen [facility] 2/9 12/25/2020 10/15/2020 02/03/2015 08/18/2013  Decreased Interest 0 0 0 0  Down, Depressed, Hopeless 0 3 1 0  PHQ - 2 Score 0 3 1 0  Altered sleeping - 3 - -  Tired, decreased energy - 3 - -  Change in appetite - 3 - -  Feeling bad or failure about yourself  - 0 - -  Trouble concentrating - 0 - -  Moving slowly or fidgety/restless - 0 - -  Suicidal thoughts - (No Data) - -  PHQ-9 Score - 12 - -  Difficult doing work/chores - Not difficult at all - -  Some recent data might be  hidden    Quality of Life:  Quality of Life - 12/21/20 1630       Quality of Life   Select Quality of Life      Quality of Life Scores   Health/Function Post 18.9 %    Socioeconomic Post 21.1 %    Psych/Spiritual Post 22.5 %    Family Post 21.39 %             Personal Goals: Goals established at orientation with interventions provided to work toward goal.  Personal Goals and Risk Factors at Admission - 10/15/20 1609       Core Components/Risk Factors/Patient Goals on Admission    Weight Management Yes;Weight Loss    Intervention Weight Management: Develop a combined nutrition and exercise program designed to reach desired caloric intake, while maintaining appropriate intake of nutrient and fiber, sodium and fats, and appropriate energy expenditure required for the weight goal.;Weight Management: Provide education and appropriate resources to help participant work on and attain dietary goals.;Weight Management/Obesity: Establish reasonable short term and long term weight goals.    Expected Outcomes Short Term: Continue to assess and modify interventions until short term weight is achieved;Long Term: Adherence to nutrition and physical activity/exercise program aimed toward attainment of established weight goal;Understanding recommendations for meals to include 15-35% energy as protein, 25-35% energy from fat, 35-60% energy from carbohydrates, less than 280m of dietary cholesterol, 20-35 gm of total fiber daily;Understanding of distribution of calorie intake throughout the day with the consumption of 4-5 meals/snacks    Diabetes Yes    Intervention Provide education about signs/symptoms and action to take for hypo/hyperglycemia.;Provide education about proper nutrition, including hydration, and aerobic/resistive exercise prescription along with prescribed medications to achieve blood glucose in normal ranges: Fasting glucose 65-99 mg/dL    Expected Outcomes Short Term: Participant  verbalizes understanding of the signs/symptoms and immediate care of hyper/hypoglycemia, proper foot  care and importance of medication, aerobic/resistive exercise and nutrition plan for blood glucose control.;Long Term: Attainment of HbA1C < 7%.    Hypertension Yes    Intervention Provide education on lifestyle modifcations including regular physical activity/exercise, weight management, moderate sodium restriction and increased consumption of fresh fruit, vegetables, and low fat dairy, alcohol moderation, and smoking cessation.;Monitor prescription use compliance.    Expected Outcomes Short Term: Continued assessment and intervention until BP is < 140/52m HG in hypertensive participants. < 130/869mHG in hypertensive participants with diabetes, heart failure or chronic kidney disease.;Long Term: Maintenance of blood pressure at goal levels.    Lipids Yes    Intervention Provide education and support for participant on nutrition & aerobic/resistive exercise along with prescribed medications to achieve LDL <7033mHDL >90m50m  Expected Outcomes Short Term: Participant states understanding of desired cholesterol values and is compliant with medications prescribed. Participant is following exercise prescription and nutrition guidelines.;Long Term: Cholesterol controlled with medications as prescribed, with individualized exercise RX and with personalized nutrition plan. Value goals: LDL < 70mg58mL > 40 mg.    Personal Goal Other Yes    Personal Goal Pt wants to gain stregth and balance    Intervention Will continue to monitor pt and progress workloads as tolerated without sign or symptom.    Expected Outcomes Pt will achieve his goals              Personal Goals Discharge:  Goals and Risk Factor Review     Row Name 10/21/20 1729 11/12/20 1453 12/15/20 1621         Core Components/Risk Factors/Patient Goals Review   Personal Goals Review Weight  Management/Obesity;Stress;Hypertension;Lipids;Diabetes Weight Management/Obesity;Stress;Hypertension;Lipids;Diabetes Weight Management/Obesity;Stress;Hypertension;Lipids;Diabetes     Review MalcoHannanted exercise on 10/19/20. Will continue to monitor MalcoDequantebeen doing well with exercise for his fitness level. Augusto's vital signs have been stable. MalcoEstanisladontly bought a rollator which he brought to exercise on 11/11/20. MalcoJahdielbeen doing well with exercise for his fitness level. Ishmeal's vital signs have been stable. MalcoZimererts feeling stronger since he has been participating in phase 2 cardiac rehab. MalcoAndy complete phase 2 cardiac rehab on 12/25/20     Expected Outcomes MalcoJahsiah continue to partcipate in phase 2 cardiac rehab for exercise, nutrtion and lifestyle modifications MalcoGrady continue to partcipate in phase 2 cardiac rehab for exercise, nutrtion and lifestyle modifications MalcoHyatt continue to partcipate in phase 2 cardiac rehab for exercise, nutrtion and lifestyle modifications              Exercise Goals and Review:  Exercise Goals     Row Name 10/15/20 1606             Exercise Goals   Increase Physical Activity Yes       Intervention Provide advice, education, support and counseling about physical activity/exercise needs.;Develop an individualized exercise prescription for aerobic and resistive training based on initial evaluation findings, risk stratification, comorbidities and participant's personal goals.       Expected Outcomes Short Term: Attend rehab on a regular basis to increase amount of physical activity.;Long Term: Add in home exercise to make exercise part of routine and to increase amount of physical activity.;Long Term: Exercising regularly at least 3-5 days a week.       Increase Strength and Stamina Yes       Intervention Provide advice, education, support and counseling about physical activity/exercise needs.;Develop an  individualized exercise prescription for aerobic  and resistive training based on initial evaluation findings, risk stratification, comorbidities and participant's personal goals.       Expected Outcomes Short Term: Increase workloads from initial exercise prescription for resistance, speed, and METs.;Short Term: Perform resistance training exercises routinely during rehab and add in resistance training at home;Long Term: Improve cardiorespiratory fitness, muscular endurance and strength as measured by increased METs and functional capacity (6MWT)       Able to understand and use rate of perceived exertion (RPE) scale Yes       Intervention Provide education and explanation on how to use RPE scale       Expected Outcomes Short Term: Able to use RPE daily in rehab to express subjective intensity level;Long Term:  Able to use RPE to guide intensity level when exercising independently       Knowledge and understanding of Target Heart Rate Range (THRR) Yes       Intervention Provide education and explanation of THRR including how the numbers were predicted and where they are located for reference       Expected Outcomes Short Term: Able to state/look up THRR;Long Term: Able to use THRR to govern intensity when exercising independently;Short Term: Able to use daily as guideline for intensity in rehab       Understanding of Exercise Prescription Yes       Intervention Provide education, explanation, and written materials on patient's individual exercise prescription       Expected Outcomes Short Term: Able to explain program exercise prescription;Long Term: Able to explain home exercise prescription to exercise independently                Exercise Goals Re-Evaluation:  Exercise Goals Re-Evaluation     Row Name 10/19/20 1645 10/30/20 1630 11/30/20 1630 12/25/20 1625       Exercise Goal Re-Evaluation   Exercise Goals Review Increase Physical Activity;Increase Strength and Stamina;Able to understand  and use rate of perceived exertion (RPE) scale;Knowledge and understanding of Target Heart Rate Range (THRR);Understanding of Exercise Prescription Increase Physical Activity;Increase Strength and Stamina;Able to understand and use rate of perceived exertion (RPE) scale;Knowledge and understanding of Target Heart Rate Range (THRR);Understanding of Exercise Prescription Increase Physical Activity;Increase Strength and Stamina;Able to understand and use rate of perceived exertion (RPE) scale;Knowledge and understanding of Target Heart Rate Range (THRR);Understanding of Exercise Prescription Increase Physical Activity;Increase Strength and Stamina;Able to understand and use rate of perceived exertion (RPE) scale;Knowledge and understanding of Target Heart Rate Range (THRR);Understanding of Exercise Prescription    Comments Pt first day in the CRP2 program. Pt tolerated exercise well and is learning his THRR, RPE and exercise Rx. Reviewed MET's, goals and home Ex Rx with pt. Pt tolerated exercise well with an average MET level of 1.8. Pt was apprehensive to add addional days to his cardiac rehab sessions, but stated he would be willing to do one addional day with his [employer] club. Pt states he is feeling like it is early to tell about his goals, but he did decide to order a walker to help with his balance and he hopes with time he will gain more strength and endurance. Reviewed MET's and goals. Pt tolerated exercise well with an average MET level of 1.8. Pt states he is doing well with his goals of increasing strength, he did decide to use a walker and he hopes with time he will gain more balance Pt graduated the [place] program today. Pt tolerated exercise well with an average MET  level of 1.9. Pt will continue to exercise 3 days a week at his local [employer] class for 30 minutes 3 days a week    Expected Outcomes Will contine to monitor pt and progress workloads as tolerated without sign or symptom pt  will add one day of exercise with silver sne[employer] for 30-45 minutes Will contine to monitor pt and progress workloads as tolerated without sign or symptom Will continue to monitor pt and progress workloads as tolerated without sign or symptom Pt will continue to exercise on his own at home             Nutrition & Weight - Outcomes:  Pre Biometrics - 10/15/20 1558       Pre Biometrics   Waist Circumference 40.25 inches    Hip Circumference 41 inches    Waist to Hip Ratio 0.98 %    Triceps Skinfold 21 mm    % Body Fat 35.5 %    Grip Strength 39 kg    Flexibility --   Not done. Spine injury   Single Leg Stand --   Not done. Spine injury            Post Biometrics - 12/18/20 1642        Post  Biometrics   Height 5' 8.39" (1.737 m)    Weight 85.2 kg    Waist Circumference 40 inches    Hip Circumference 41.5 inches    Waist to Hip Ratio 0.96 %    BMI (Calculated) 28.24    Triceps Skinfold 19 mm    % Body Fat 29.2 %    Grip Strength 28 kg    Flexibility --   Not done. Spinal injury   Single Leg Stand --   Not done. Spinal injury            Nutrition:  Nutrition Therapy & Goals - 10/27/20 1028       Nutrition Therapy   Diet TLC    Drug/Food Interactions Statins/Certain Fruits      Personal Nutrition Goals   Nutrition Goal Pt to build a healthy plate including vegetables, fruits, whole grains, and low-fat dairy products in a heart healthy meal plan.      Intervention Plan   Intervention Prescribe, educate and counsel regarding individualized specific dietary modifications aiming towards targeted core components such as weight, hypertension, lipid management, diabetes, heart failure and other comorbidities.    Expected Outcomes Short Term Goal: Understand basic principles of dietary content, such as calories, fat, sodium, cholesterol and nutrients.             Nutrition Discharge:   Education Questionnaire Score:  Knowledge Questionnaire Score -  12/21/20 1630       Knowledge Questionnaire Score   Post Score 20/24             Goals reviewed with patient; copy given to patient.Pt graduated from cardiac rehab program on 12/25/20  with completion of 24 exercise sessions in Phase II. Pt maintained good attendance and progressed nicely during his participation in rehab as evidenced by increased MET level.   Medication list reconciled. Repeat  PHQ score- 0 .  Pt has made significant lifestyle changes and should be commended for his success. Pt feels he has achieved his goals during cardiac rehab.   Pt was encouraged to continue exercise in cardiac rehab at [employer] 3 times a week.Barnet Pall, RN,BSN 12/31/2020 3:13 PM

## 2020-12-28 ENCOUNTER — Encounter (HOSPITAL_COMMUNITY): Payer: Medicare Other

## 2020-12-29 ENCOUNTER — Encounter: Payer: Self-pay | Admitting: Cardiology

## 2020-12-29 ENCOUNTER — Ambulatory Visit (INDEPENDENT_AMBULATORY_CARE_PROVIDER_SITE_OTHER): Payer: Medicare Other | Admitting: Cardiology

## 2020-12-29 ENCOUNTER — Other Ambulatory Visit: Payer: Self-pay

## 2020-12-29 VITALS — BP 90/48 | HR 46 | Ht 69.0 in | Wt 191.0 lb

## 2020-12-29 DIAGNOSIS — I480 Paroxysmal atrial fibrillation: Secondary | ICD-10-CM

## 2020-12-29 DIAGNOSIS — R5383 Other fatigue: Secondary | ICD-10-CM

## 2020-12-29 DIAGNOSIS — R9431 Abnormal electrocardiogram [ECG] [EKG]: Secondary | ICD-10-CM

## 2020-12-29 DIAGNOSIS — I25119 Atherosclerotic heart disease of native coronary artery with unspecified angina pectoris: Secondary | ICD-10-CM

## 2020-12-29 DIAGNOSIS — G4733 Obstructive sleep apnea (adult) (pediatric): Secondary | ICD-10-CM | POA: Diagnosis not present

## 2020-12-29 DIAGNOSIS — Z9861 Coronary angioplasty status: Secondary | ICD-10-CM | POA: Diagnosis not present

## 2020-12-29 DIAGNOSIS — I5189 Other ill-defined heart diseases: Secondary | ICD-10-CM

## 2020-12-29 DIAGNOSIS — Z9989 Dependence on other enabling machines and devices: Secondary | ICD-10-CM

## 2020-12-29 DIAGNOSIS — Z7901 Long term (current) use of anticoagulants: Secondary | ICD-10-CM

## 2020-12-29 DIAGNOSIS — R3912 Poor urinary stream: Secondary | ICD-10-CM

## 2020-12-29 DIAGNOSIS — I2510 Atherosclerotic heart disease of native coronary artery without angina pectoris: Secondary | ICD-10-CM | POA: Diagnosis not present

## 2020-12-29 DIAGNOSIS — E785 Hyperlipidemia, unspecified: Secondary | ICD-10-CM | POA: Diagnosis not present

## 2020-12-29 DIAGNOSIS — R5382 Chronic fatigue, unspecified: Secondary | ICD-10-CM

## 2020-12-29 DIAGNOSIS — I491 Atrial premature depolarization: Secondary | ICD-10-CM

## 2020-12-29 DIAGNOSIS — R0600 Dyspnea, unspecified: Secondary | ICD-10-CM | POA: Diagnosis not present

## 2020-12-29 DIAGNOSIS — I10 Essential (primary) hypertension: Secondary | ICD-10-CM

## 2020-12-29 DIAGNOSIS — R0609 Other forms of dyspnea: Secondary | ICD-10-CM

## 2020-12-29 MED ORDER — TELMISARTAN 40 MG PO TABS
20.0000 mg | ORAL_TABLET | Freq: Every day | ORAL | 3 refills | Status: DC
Start: 2020-12-29 — End: 2021-01-04

## 2020-12-29 NOTE — Progress Notes (Signed)
Primary Care Provider: Lavone Orn, MD Cardiologist: Glenetta Hew, MD Electrophysiologist: None  Clinic Note: Chief Complaint  Patient presents with   Follow-up    Just feels tired all the time.   Coronary Artery Disease    1 episode of chest pain last night, but none otherwise none since PCI.    ===================================  ASSESSMENT/PLAN   Problem List Items Addressed This Visit       Cardiology Problems   CAD S/P PCI: Cypher DES x2 to LCx (2006); 07/2020: Additional DES stent LCx, with stent in LAD (Chronic)    We will have him off of his antiplatelet agents while he was on Eliquis for A. fib.  Now he has significant stents in circumflex as well as a stent in the LAD.  Would like to try to continue on Plavix, but after 6 months can be held for procedures or surgeries.  If no bleeding issues occur, would like to continue dual therapy out at least 2 years.  With concern of fatigue, will check a CBC and iron panel.  Need to exclude bleeding.  Otherwise continue beta-blocker ARB and statin.      Relevant Medications   telmisartan (MICARDIS) 40 MG tablet   Other Relevant Orders   EKG 12-Lead (Completed)   Brain natriuretic peptide (Completed)   CBC (Completed)   TSH (Completed)   Troponin T   PSA (Completed)   Fe+TIBC+Fer (Completed)   PAF (paroxysmal atrial fibrillation) ; CHA2DS2-VASc Score =5 (Eliquis) (Chronic)    Somehow he was put back on a calcium channel blocker, and his energy level is down along with his hypotension.  Somehow diltiazem was added back on.  We will stop that-bradycardic today and hypotensive.  Back on Eliquis along with Plavix.  No longer on aspirin.  Will check CBC and iron panel.      Relevant Medications   telmisartan (MICARDIS) 40 MG tablet   Other Relevant Orders   EKG 12-Lead (Completed)   CBC (Completed)   TSH (Completed)   Troponin T   PSA (Completed)   Fe+TIBC+Fer (Completed)   Essential hypertension (Chronic)     Hypotensive today.  Unusual for him, but this may explain his fatigue and dizziness.  Plan: DC diltiazem, continue carvedilol at current dose.  Also continue Micardis, but reduce to 20 mg and take in the evening.  Take chlorthalidone in the morning.      Relevant Medications   telmisartan (MICARDIS) 40 MG tablet   Hyperlipidemia LDL goal <70 (Chronic)    Labs look well controlled in February.  Continue statin.      Relevant Medications   telmisartan (MICARDIS) 40 MG tablet   Finding of multiple premature atrial contractions by electrocardiography (Chronic)    I suspect that he does have precursor of A. fib, I am wondering if what the little bursts he had for the first month post cath were.  Suspect that it could be short little bursts or just PVCs.  I am little bit interested in the abnormal EKG.  Can continue beta-blocker but stop diltiazem.      Relevant Medications   telmisartan (MICARDIS) 40 MG tablet   Other Relevant Orders   EKG 12-Lead (Completed)   CBC (Completed)   TSH (Completed)   Troponin T   PSA (Completed)   Fe+TIBC+Fer (Completed)   Coronary artery disease involving native coronary artery of native heart with angina pectoris ([place]) - Primary (Chronic)    No longer having angina.  He  had 1 episode last night of symptoms but not sure that is related to.  Did the abnormal EKG I did order a troponin (however did not get drawn).  Also ordered BNP level.  Plan: Continue carvedilol along with statin and Plavix.  We will do Plavix for least a year based on the extent of PCI and as long as he has no bleeding issues may continue beyond.  Concerned with him also being on Brilinta.      Relevant Medications   telmisartan (MICARDIS) 40 MG tablet     Other   Anticoagulation adequate, Eliquis with CHA2DS2VASc of 5 (Chronic)    CHA2DS2-VASc score of 5.  On Eliquis, but also on Plavix for recent stents.  Monitor closely for signs of bleeding-checking CBC and iron  panel.  Provided he stays stable, would like to try to get at least a year if not 2 years of clopidogrel in addition to Eliquis.  Would like to get Lasix months out from PCI before potentially holding if necessary for procedures or surgeries, however there is bleeding, would hold Eliquis first during the first 6 months..      Relevant Orders   CBC (Completed)   TSH (Completed)   Troponin T   PSA (Completed)   Fe+TIBC+Fer (Completed)   OSA on CPAP (Chronic)   Relevant Orders   CBC (Completed)   Fe+TIBC+Fer (Completed)   Chronic fatigue   Relevant Orders   Brain natriuretic peptide (Completed)   CBC (Completed)   TSH (Completed)   Troponin T   PSA (Completed)   Fe+TIBC+Fer (Completed)   Nonspecific abnormal electrocardiogram (ECG) (EKG)    Abnormal EKG which may very well be indicative of recent LAD PCI.  There is nothing to suggest any ischemic symptoms.  Checking BNP and troponin levels.      Relevant Orders   EKG 12-Lead (Completed)   Brain natriuretic peptide (Completed)   CBC (Completed)   TSH (Completed)   Troponin T   PSA (Completed)   Fe+TIBC+Fer (Completed)   Diastolic dysfunction without heart failure (Chronic)    We will see him back in 3 months to reassess his symptoms, low threshold to recheck echocardiogram especially in light of abnormal EKG.      Other Visit Diagnoses     Fatigue, unspecified type       Relevant Orders   Brain natriuretic peptide (Completed)   CBC (Completed)   TSH (Completed)   Troponin T   Fe+TIBC+Fer (Completed)   Dyspnea on exertion        Relevant Orders   Brain natriuretic peptide (Completed)   Poor urinary stream        Relevant Orders   PSA (Completed)      ===================================  HPI:    [name] is a 85 y.o. male with a PMH below who presents today for 56-month follow-up.   [name] was last seen by me on July 30, 2020 in response to Myoview stress test read as abnormal/high  risk.  He was scheduled for cardiac catheterization which revealed essentially occluded RCA with significant disease in the LAD and LCx treated with staged PCI.  Recent Hospitalizations:  Admitted post cath on 08/03/2020 to hydrate for staged PCI on 08/04/2020.  He was seen by Fabian Sharp, PA on 08/26/2020 for post cath follow-up.  Was doing well.  No further chest pain.  Frustrated with fatigue which had not changed since PCI.  Was told to review CPAP settings with Dr.  Sood.  Was also restarted on Zoloft - converted to Lexapro  Reviewed  CV studies:    The following studies were reviewed today: (if available, images/films reviewed: From Epic Chart or Care Everywhere) Cardiac Catheterization 08/03/2020: Proximal-mid RCA 99% with 90% side branch R VM.  Distal RCA 100%.  Proximal LAD 40%.  Mid LAD 75%.  Proximal LCx 65%.  Proximal-mid LCx 30%.  Mid LCx 70% with widely patent mid to distal LCx stent patent.  Mildly elevated LVEDP. Staged PCI 08/04/2020:: 2-vessel PCI: Mid to distal LCx lesions treated with a single Synergy DES 2.7 mm x 38 mm postdilated to 3.0 mm.  (Overlaps previous stent, and crosses OM1 proximally) Mid LAD lesion treated with Synergy DES 2.56mm x 20 mm (postdilated tapered from 3.1 to 2.8 mm)   Interval History:   SAMIT SYLVE presents here today no longer feeling the chest pain he said for the first month after his PCI he felt little bursts of palpitations in his chest off and on that subsequently resolved.  He is not having any more chest discomfort but he still just feels tired all the time.  However, last night he did have an episode of chest discomfort with lying down, it lasted a few minutes and went away.  Has not anything since.  He just has no energy.  He has dizziness with standing up quickly to the point where he almost passed out, did not truly pass out.  He does not necessarily notice any PND or orthopnea, but does have elevated edema.  He recently retired from cardiac  rehab.  He very much enjoyed this experience and felt moved by the all the care to the team provided.    CV Review of Symptoms (Summary)Ortho dizzy - near syncope  - no syncope. Short burst of palpitations - initially post PCI Cardiovascular ROS: positive for - chest pain, dyspnea on exertion, edema, palpitations, and generalized fatigue, weakness and dizziness mostly orthostatic with near syncope. negative for - irregular heartbeat, loss of consciousness, orthopnea, paroxysmal nocturnal dyspnea, shortness of breath, or TIA or arm or amaurosis fugax, claudication  REVIEWED OF SYSTEMS   Review of Systems  Constitutional:  Positive for malaise/fatigue. Negative for weight loss.  HENT:  Negative for congestion and nosebleeds.   Respiratory:  Positive for shortness of breath (With exertion, but not significant). Negative for cough.   Cardiovascular:        Per HPI  Gastrointestinal:  Positive for blood in stool (Sometimes with wiping.). Negative for melena.  Genitourinary:  Negative for flank pain and hematuria.  Musculoskeletal:  Positive for back pain and joint pain.  Neurological:  Positive for dizziness, sensory change (Pedal neuropathy) and weakness (Generalized, his with legs feel weak).  Psychiatric/Behavioral:  Positive for depression (Has stabilized now switched from Zoloft to Lexapro.) and memory loss. Negative for suicidal ideas. The patient is not nervous/anxious and does not have insomnia.    I have reviewed and (if needed) personally updated the patient's problem list, medications, allergies, past medical and surgical history, social and family history.   PAST MEDICAL HISTORY   Past Medical History:  Diagnosis Date   Arthritis    CAD S/P percutaneous coronary angioplasty 08/2004   a) CARDIOLOGIST-  DR Clayvon Parlett; DES to LCx. X2  - Cypher 2.5 mm postdilated to 2.75 mm (20 mm and 13 mm stents);; Myoview 07/2020 ++ -> Cath with diffuse 90-100% CTO of p-dRCA (L-R collaterals),  mid LAD 75 (SYNERGY DES 2.5 X 20 -  3.1>2.8), Mid-Distal LCx 65% & 70% prior to patent stent --> DES PCI (overlaps old) - SYNERGY 2.75 X 38 -> 3.1 mm)   Colon polyps 05/10/2005   Hyperplastic polyps   Complication of anesthesia    "takes long time to wake up"   Depression    Diabetes mellitus type 2 with complications ([place])    neuropathy; CAD borderline- no med   Diastolic dysfunction without heart failure    Moderate LVH, Gr 1 DD on Echo 2011; no CHF admissions, no tt on diuretisc   Essential hypertension    H/O: gout STABLE   History of melanoma excision 2010   FOREHEAD   History of prostate cancer 2004--  S/P SEED IMPLANTS    NO RECURRENCE   Hydrocele, left    Hyperlipidemia LDL goal <70    Impaired hearing RIGHT HEARING AID   OSA on CPAP    bipap   PAF (paroxysmal atrial fibrillation) ([place]) 12/11/2017   Admitted with Afib RVR - converted on IV Diltiazem. d/c on Eliquis; CHA2DS2Vasc = 5.   Prostate cancer [facility])    prostate , melanoma head   Shingles 2007   Syncope and collapse 06/2015   Likely related to post micturition, Neurontin and dehydration with orthostatic hypotension   Urgency of urination     PAST SURGICAL HISTORY   Past Surgical History:  Procedure Laterality Date   Cardiac Event Monitor  3-07/2015   Mostly SR - 58-132 bpm. Rare PVCs (occ bigeminy), Frequent PACs - singlets, couplets - short runs of PAT.   circumsision     CORONARY ANGIOPLASTY WITH STENT PLACEMENT  09/01/2004   Cypher DES x 2 d-m LCx 2.5 mm x 20 mm & 2.5 mm x 13 mm (~2.75 mm)   CORONARY STENT INTERVENTION N/A 08/04/2020   Procedure: CORONARY STENT INTERVENTION;  Surgeon: Leonie Man, MD;  Location: [facility];  Service: Cardiovascular;; STAGED: mid LAD 75% (SYNERGY DES 2.5 X 20 --> 3.1-28 mm post-dilation; Mid LCx 65% - distal 70% prior to old stents => DES PCI (overlapps old stent & crossing OM1) SYNERGY DES 2.75 X 38 -> 3.1 mm)   EXCISION MELANOMA FROM FOREHEAD  2010   EYE SURGERY  Bilateral    cataracts   HYDROCELE EXCISION  10/14/2011   Procedure: HYDROCELECTOMY ADULT;  Surgeon: Ailene Rud, MD;  [facility];  Service: Urology;  Laterality: Left;   LAMINECTOMY N/A 07/13/2012   Procedure: THORACIC LAMINECTOMY FOR RESECTION OF INTRAMEDULLARY SPINAL CHORD TUMOR WITH SPINAL CHORD MONITORING;  Surgeon: Erline Levine, MD;  Location: [place] [facility];  Service: Neurosurgery;  Laterality: N/A;  Thoracic laminectomy for resection of intramedullary spinal cord tumor with spinal cord monitering and Dr. Christella Noa to assist   LAMINECTOMY N/A 12/09/2014   Procedure: Redo Thoracic laminectomy with intramedullary tumar resection/USN/[place]/Subarachnoid shunt/spinal cord monitoring/Dr. Kathyrn Sheriff to assist;  Surgeon: Erline Levine, MD;  Location: [facility];  Service: Neurosurgery;  Laterality: N/A;  Redo Thoracic laminectomy with intramedullary tumar resection/USN/[place]/Subarachnoid shunt/spinal cord monitoring/Dr. Kathyrn Sheriff to assist   LEFT HEART CATH AND CORONARY ANGIOGRAPHY N/A 08/03/2020   Procedure: LEFT HEART CATH AND CORONARY ANGIOGRAPHY;  Surgeon: Leonie Man, MD;  Location: [facility] INVASIVE CV LAB;;Proximal-mid RCA 99% with 90% side branch R VM.  Distal RCA 100%.  Proximal LAD 40%.  Mid LAD 75%.  Proximal LCx 65%.  Proximal-mid LCx 30%.  Mid LCx 70% with widely patent mid to distal LCx stent patent.  Mildly elevated LVEDP. - staged  PCI LAD & LCx   NM MYOVIEW LTD  3/212014; June 2016   Both Lexiscan: a. LOW RISK, mild inferior bowel artifact; No ischemia or Infarction; b. Low risk, normal study. No infarction or ischemia. No artifact.   NM MYOVIEW LTD  07/09/2020   Fixed mid to apical anteroseptal perfusion defect with hypokinesia in this area consistent with prior infarct.  EF estimated 50%.  Also fixed inferior perfusion defect and apical defect with normal wall motion consistent with artifact.  Read as low risk, no ischemia, however there is a significant  anterior defect not present on previous study.Marland Kitchen   PROSTATE PALLADIUM GOLD SEED IMPLANTS (118)  11/15/2002   PROSTATE CANCER   SKIN BIOPSY     TONSILLECTOMY     TRANSTHORACIC ECHOCARDIOGRAM  12/2017   (A. fib): Moderate concentric LVH.  EF 55 to 60%.  No wall motion normality.  Mild to moderately dilated left atrium.    Immunization History  Administered Date(s) Administered   Influenza,inj,Quad PF,6+ Mos 12/10/2014   PFIZER(Purple Top)SARS-COV-2 Vaccination 04/17/2019, 05/08/2019, 12/23/2019, 07/05/2020   Pneumococcal Polysaccharide-23 07/14/2012    MEDICATIONS/ALLERGIES   Current Meds  Medication Sig   allopurinol (ZYLOPRIM) 300 MG tablet Take 1 tablet (300 mg total) by mouth daily.   apixaban (ELIQUIS) 2.5 MG TABS tablet Take 1 tablet (2.5 mg total) by mouth 2 (two) times daily.   carvedilol (COREG) 6.25 MG tablet Take 6.25 mg by mouth 2 (two) times daily with a meal.   chlorthalidone (HYGROTON) 25 MG tablet Take 12.5 mg by mouth daily.   Cholecalciferol (VITAMIN D3) 50 MCG (2000 UT) CAPS Take 2,000 Units by mouth daily.   clopidogrel (PLAVIX) 75 MG tablet TAKE 1 TABLET BY MOUTH EVERY DAY   diclofenac Sodium (VOLTAREN) 1 % GEL Apply 2 g topically daily as needed (Pain).   escitalopram (LEXAPRO) 20 MG tablet Take 20 mg by mouth daily.   ketoconazole (NIZORAL) 2 % cream Apply 1 application topically 2 (two) times daily.   lidocaine (XYLOCAINE) 5 % ointment Apply 1 application topically daily as needed for mild pain or moderate pain.   Lidocaine 4 % PTCH Apply 1 patch topically daily as needed (Pain).   metFORMIN (GLUCOPHAGE) 500 MG tablet Take 1 tablet (500 mg total) by mouth 2 (two) times daily with a meal.   nitroGLYCERIN (NITROSTAT) 0.4 MG SL tablet Place 1 tablet (0.4 mg total) under the tongue every 5 (five) minutes as needed for chest pain.   ONETOUCH ULTRA test strip USE TO TEST BLOOD SUGAR ONCE OR TWICE A DAY (E11.22)   oxybutynin (DITROPAN XL) 15 MG 24 hr tablet Take 1  tablet (15 mg total) by mouth at bedtime.   rosuvastatin (CRESTOR) 20 MG tablet Take 20 mg by mouth every evening.   tamsulosin (FLOMAX) 0.4 MG CAPS capsule Take 1 capsule (0.4 mg total) by mouth at bedtime.   [DISCONTINUED] diltiazem (CARDIZEM CD) 240 MG 24 hr capsule Take 240 mg by mouth daily.   [DISCONTINUED] telmisartan (MICARDIS) 40 MG tablet Take 1 tablet (40 mg total) by mouth daily.    Allergies  Allergen Reactions   Betadine [Povidone Iodine] Anaphylaxis   Contrast Media [Iodinated Diagnostic Agents] Anaphylaxis and Rash    Other Reaction: Other reaction   Iodine Anaphylaxis    Shellfish, IVP dye. Swelling in throat and foaming at mouth.   Shellfish Allergy Anaphylaxis    SOCIAL HISTORY/FAMILY HISTORY   Reviewed in Epic:  Pertinent findings:  Social History   Tobacco Use  Smoking status: Never   Smokeless tobacco: Never   Tobacco comments:    occ alcohol  Vaping Use   Vaping Use: Never used  Substance Use Topics   Alcohol use: Yes    Comment: OCCASIONAL   Drug use: No   Social History   Social History Narrative   He is married, father of 2, grandfather of 52. Does not really get exercise now because   of his back issues. Does not smoke and drinks social alcohol. Works in [employer].    OBJCTIVE -PE, EKG, labs   Wt Readings from Last 3 Encounters:  12/29/20 191 lb (86.6 kg)  12/18/20 187 lb 13.3 oz (85.2 kg)  10/15/20 187 lb 2.7 oz (84.9 kg)    Physical Exam: BP (!) 90/48 (BP Location: Left Arm)   Pulse (!) 46   Ht 5\' 9"  (1.753 m)   Wt 191 lb (86.6 kg)   SpO2 98%   BMI 28.21 kg/m  Physical Exam Vitals reviewed.  Constitutional:      General: He is not in acute distress.    Appearance: Normal appearance. He is normal weight. He is not ill-appearing or toxic-appearing.     Comments: Well-groomed.  He does seem to be his stated age.  HENT:     Head: Normocephalic and atraumatic.  Neck:     Vascular: No carotid bruit or JVD.   Cardiovascular:     Rate and Rhythm: Regular rhythm. Bradycardia present. No extrasystoles are present.    Chest Wall: PMI is not displaced.     Pulses: Decreased pulses (Diminished with palpable pedal pulses.  Likely due to hypotension.).     Heart sounds: S1 normal and S2 normal. Heart sounds are distant. No murmur heard.   No gallop.  Pulmonary:     Effort: Pulmonary effort is normal. No respiratory distress.     Breath sounds: Normal breath sounds.  Musculoskeletal:        General: No swelling. Normal range of motion.     Cervical back: Normal range of motion and neck supple.  Skin:    General: Skin is warm and dry.  Neurological:     General: No focal deficit present.     Mental Status: He is alert and oriented to person, place, and time.     Motor: Weakness (Legs are weak) present.  Psychiatric:        Behavior: Behavior normal.        Thought Content: Thought content normal.        Judgment: Judgment normal.     Comments: He seems somewhat down.  Not his usual joking self.  Although he was very excited about his graduation from cardiac rehab.      Adult ECG Report  Rate: 46;  Rhythm: sinus bradycardia and 1 degree AVB.  Anterior Mikan age-indeterminate.  ST and T wave changes consistent with recent anterolateral MI. ;   Narrative Interpretation: New ST changes.  Recent Labs:  reviewed  Lab Results  Component Value Date   CHOL 106 08/05/2020   HDL 38 (L) 08/05/2020   LDLCALC 44 08/05/2020   TRIG 121 08/05/2020   CHOLHDL 2.8 08/05/2020   Lab Results  Component Value Date   CREATININE 1.74 (H) 08/26/2020   BUN 28 (H) 08/26/2020   NA 140 08/26/2020   K 4.3 08/26/2020   CL 102 08/26/2020   CO2 23 08/26/2020   CBC Latest Ref Rng & Units 12/29/2020 08/26/2020 08/05/2020  WBC 3.4 -  10.8 x10E3/uL 7.4 9.4 14.7(H)  Hemoglobin 13.0 - 17.7 g/dL 11.8(L) 11.9(L) 11.5(L)  Hematocrit 37.5 - 51.0 % 37.0(L) 35.4(L) 34.9(L)  Platelets 150 - 450 x10E3/uL 193 249 180    Lab  Results  Component Value Date   HGBA1C 6.2 (H) 08/03/2020   Lab Results  Component Value Date   TSH 5.730 (H) 12/29/2020    ==================================================  COVID-19 Education: The signs and symptoms of COVID-19 were discussed with the patient and how to seek care for testing (follow up with PCP or arrange E-visit).    I spent a total of 40 minutes with the patient spent in direct patient consultation.  Additional time spent with chart review  / charting (studies, outside notes, etc): 21 min Total Time: 61 min  Current medicines are reviewed at length with the patient today.  (+/- concerns) n/a  This visit occurred during the SARS-CoV-2 public health emergency.  Safety protocols were in place, including screening questions prior to the visit, additional usage of staff PPE, and extensive cleaning of exam room while observing appropriate contact time as indicated for disinfecting solutions.  Notice: This dictation was prepared with Dragon dictation along with smart phrase technology. Any transcriptional errors that result from this process are unintentional and may not be corrected upon review.  Patient Instructions / Medication Changes & Studies & Tests Ordered   Patient Instructions  Medication Instructions:   Stop taking Diltiazem   Start taking Telmisartan 20 mg ( 1/2 tablet of 40 mg )  at bedtime  *If you need a refill on your cardiac medications before your next appointment, please call your pharmacy*   Lab Work:  BNP TSH Iron Panel- FE+TIBC- FER Tropinin T CBC PSA If you have labs (blood work) drawn today and your tests are completely normal, you will receive your results only by: MyChart Message (if you have MyChart) OR A paper copy in the mail If you have any lab test that is abnormal or we need to change your treatment, we will call you to review the results.   Testing/Procedures:  Not needed  Follow-Up: At [facility], you and your  health needs are our priority.  As part of our continuing mission to provide you with exceptional heart care, we have created designated Provider Care Teams.  These Care Teams include your primary Cardiologist (physician) and Advanced Practice Providers (APPs -  Physician Assistants and Nurse Practitioners) who all work together to provide you with the care you need, when you need it.     Your next appointment:   3 month(s)  The format for your next appointment:   In Person  Provider:   Glenetta Hew, MD   Studies Ordered:   Orders Placed This Encounter  Procedures   Brain natriuretic peptide   CBC   TSH   Troponin T   PSA   Fe+TIBC+Fer   EKG 12-Lead      Glenetta Hew, M.D., M.S. Interventional Cardiologist   Pager # [phone] Phone # [phone] [address] [place], [place] [address]   Thank you for choosing Heart[facility] at [facility]!!

## 2020-12-29 NOTE — Patient Instructions (Addendum)
Medication Instructions:   Stop taking Diltiazem   Start taking Telmisartan 20 mg ( 1/2 tablet of 40 mg )  at bedtime  *If you need a refill on your cardiac medications before your next appointment, please call your pharmacy*   Lab Work:  BNP TSH Iron Panel- FE+TIBC- FER Tropinin T CBC PSA If you have labs (blood work) drawn today and your tests are completely normal, you will receive your results only by: [place] (if you have MyChart) OR A paper copy in the mail If you have any lab test that is abnormal or we need to change your treatment, we will call you to review the results.   Testing/Procedures:  Not needed  Follow-Up: At [facility], you and your health needs are our priority.  As part of our continuing mission to provide you with exceptional heart care, we have created designated Provider Care Teams.  These Care Teams include your primary Cardiologist (physician) and Advanced Practice Providers (APPs -  Physician Assistants and Nurse Practitioners) who all work together to provide you with the care you need, when you need it.     Your next appointment:   3 month(s)  The format for your next appointment:   In Person  Provider:   Glenetta Hew, MD

## 2020-12-30 LAB — CBC
Hematocrit: 37 % — ABNORMAL LOW (ref 37.5–51.0)
Hemoglobin: 11.8 g/dL — ABNORMAL LOW (ref 13.0–17.7)
MCH: 29.6 pg (ref 26.6–33.0)
MCHC: 31.9 g/dL (ref 31.5–35.7)
MCV: 93 fL (ref 79–97)
Platelets: 193 10*3/uL (ref 150–450)
RBC: 3.98 x10E6/uL — ABNORMAL LOW (ref 4.14–5.80)
RDW: 14.9 % (ref 11.6–15.4)
WBC: 7.4 10*3/uL (ref 3.4–10.8)

## 2020-12-30 LAB — IRON,TIBC AND FERRITIN PANEL
Ferritin: 61 ng/mL (ref 30–400)
Iron Saturation: 13 % — ABNORMAL LOW (ref 15–55)
Iron: 42 ug/dL (ref 38–169)
Total Iron Binding Capacity: 328 ug/dL (ref 250–450)
UIBC: 286 ug/dL (ref 111–343)

## 2020-12-30 LAB — TSH: TSH: 5.73 u[IU]/mL — ABNORMAL HIGH (ref 0.450–4.500)

## 2020-12-30 LAB — PSA: Prostate Specific Ag, Serum: <0.1 ng/mL (ref 0.0–4.0)

## 2020-12-30 LAB — BRAIN NATRIURETIC PEPTIDE: BNP: 816.2 pg/mL — ABNORMAL HIGH (ref 0.0–100.0)

## 2021-01-01 DIAGNOSIS — I10 Essential (primary) hypertension: Secondary | ICD-10-CM | POA: Diagnosis not present

## 2021-01-03 ENCOUNTER — Encounter: Payer: Self-pay | Admitting: Cardiology

## 2021-01-03 NOTE — Assessment & Plan Note (Signed)
I suspect that he does have precursor of A. fib, I am wondering if what the little bursts he had for the first month post cath were.  Suspect that it could be short little bursts or just PVCs.  I am little bit interested in the abnormal EKG.  Can continue beta-blocker but stop diltiazem.

## 2021-01-03 NOTE — Assessment & Plan Note (Addendum)
CHA2DS2-VASc score of 5.  On Eliquis, but also on Plavix for recent stents.  Monitor closely for signs of bleeding-checking CBC and iron panel.  Provided he stays stable, would like to try to get at least a year if not 2 years of clopidogrel in addition to Eliquis.  Would like to get Lasix months out from PCI before potentially holding if necessary for procedures or surgeries, however there is bleeding, would hold Eliquis first during the first 6 months.[name]

## 2021-01-03 NOTE — Assessment & Plan Note (Signed)
No longer having angina.  He had 1 episode last night of symptoms but not sure that is related to.  Did the abnormal EKG I did order a troponin (however did not get drawn).  Also ordered BNP level.  Plan: Continue carvedilol along with statin and Plavix.  We will do Plavix for least a year based on the extent of PCI and as long as he has no bleeding issues may continue beyond.  Concerned with him also being on Brilinta.

## 2021-01-03 NOTE — Assessment & Plan Note (Signed)
We will see him back in 3 months to reassess his symptoms, low threshold to recheck echocardiogram especially in light of abnormal EKG.

## 2021-01-03 NOTE — Assessment & Plan Note (Signed)
Hypotensive today.  Unusual for him, but this may explain his fatigue and dizziness.  Plan: DC diltiazem, continue carvedilol at current dose.  Also continue Micardis, but reduce to 20 mg and take in the evening.  Take chlorthalidone in the morning.

## 2021-01-03 NOTE — Assessment & Plan Note (Addendum)
Somehow he was put back on a calcium channel blocker, and his energy level is down along with his hypotension.  Somehow diltiazem was added back on.  We will stop that-bradycardic today and hypotensive.  Back on Eliquis along with Plavix.  No longer on aspirin.  Will check CBC and iron panel.

## 2021-01-03 NOTE — Assessment & Plan Note (Signed)
We will have him off of his antiplatelet agents while he was on Eliquis for A. fib.  Now he has significant stents in circumflex as well as a stent in the LAD.  Would like to try to continue on Plavix, but after 6 months can be held for procedures or surgeries.  If no bleeding issues occur, would like to continue dual therapy out at least 2 years.  With concern of fatigue, will check a CBC and iron panel.  Need to exclude bleeding.  Otherwise continue beta-blocker ARB and statin.

## 2021-01-03 NOTE — Assessment & Plan Note (Signed)
Labs look well controlled in February.  Continue statin.

## 2021-01-03 NOTE — Assessment & Plan Note (Signed)
Abnormal EKG which may very well be indicative of recent LAD PCI.  There is nothing to suggest any ischemic symptoms.  Checking BNP and troponin levels.

## 2021-01-04 ENCOUNTER — Telehealth: Payer: Self-pay | Admitting: Cardiology

## 2021-01-04 NOTE — Telephone Encounter (Signed)
My last note indicated I stopped diltiazem because his heart rate was in the 40s and he is feeling fatigued.  I am fine if his blood pressure goes to 160.  I would prefer not to restart diltiazem or beta-blocker.  Glenetta Hew, MD

## 2021-01-04 NOTE — Telephone Encounter (Signed)
Spoke to [place] at [employer] office.She stated they monitor patient's B/P remotely and it has been ranging 153 to 794 systolic.Telmisartan was stopped due to kidney functions.Dr.Griffin wanted to ask Dr.Harding if ok for patient to restart Diltiazem.Advised I will send message to him for advice.

## 2021-01-04 NOTE — Telephone Encounter (Signed)
Pt c/o medication issue:  1. Name of Medication: telmisartan   2. How are you currently taking this medication (dosage and times per day)? 1/2 tab a day  3. Are you having a reaction (difficulty breathing--STAT)? No   4. What is your medication issue? PCP states this patient should not be taking this at all  BP running 120/60 average.

## 2021-01-05 NOTE — Telephone Encounter (Signed)
[name] calling back to follow up.

## 2021-01-05 NOTE — Telephone Encounter (Signed)
RN spoke with Aldona Bar from Dr Delene Ruffini office .   RN  read Dr Ellyn Hack note in regards to question  blood pressure and not restarting  diltiazem or beta blocker  Samantha verbalized understanding.

## 2021-01-06 ENCOUNTER — Telehealth: Payer: Self-pay | Admitting: *Deleted

## 2021-01-06 DIAGNOSIS — I480 Paroxysmal atrial fibrillation: Secondary | ICD-10-CM

## 2021-01-06 DIAGNOSIS — R0609 Other forms of dyspnea: Secondary | ICD-10-CM

## 2021-01-06 DIAGNOSIS — R5383 Other fatigue: Secondary | ICD-10-CM

## 2021-01-06 NOTE — Telephone Encounter (Signed)
,  The patient has been notified of the result and verbalized understanding.  All questions (if any) were answered. Echo ordered.patient aware scheduler will call to schedule Raiford Simmonds, RN 01/06/2021 5:06 PM

## 2021-01-06 NOTE — Telephone Encounter (Signed)
-----   Message from Leonie Man, MD sent at 01/03/2021 11:47 PM EDT ----- Labs were interesting.  BNP level was high -which would indicate possible heart failure symptoms.  Would like to check an Echocardiogram just to make sure all is well.   CBC - stable  blood counts, no sign of bleeding. Normal Iron levels. Normal PSA  Chemistry panel shows stable kidney function.  Glenetta Hew, MD

## 2021-01-11 DIAGNOSIS — N1832 Chronic kidney disease, stage 3b: Secondary | ICD-10-CM | POA: Diagnosis not present

## 2021-01-15 DIAGNOSIS — I701 Atherosclerosis of renal artery: Secondary | ICD-10-CM | POA: Diagnosis not present

## 2021-01-15 DIAGNOSIS — I5022 Chronic systolic (congestive) heart failure: Secondary | ICD-10-CM | POA: Diagnosis not present

## 2021-01-15 DIAGNOSIS — F330 Major depressive disorder, recurrent, mild: Secondary | ICD-10-CM | POA: Diagnosis not present

## 2021-01-15 DIAGNOSIS — I10 Essential (primary) hypertension: Secondary | ICD-10-CM | POA: Diagnosis not present

## 2021-01-15 DIAGNOSIS — E1122 Type 2 diabetes mellitus with diabetic chronic kidney disease: Secondary | ICD-10-CM | POA: Diagnosis not present

## 2021-01-15 DIAGNOSIS — N1831 Chronic kidney disease, stage 3a: Secondary | ICD-10-CM | POA: Diagnosis not present

## 2021-01-15 DIAGNOSIS — E782 Mixed hyperlipidemia: Secondary | ICD-10-CM | POA: Diagnosis not present

## 2021-01-15 DIAGNOSIS — I480 Paroxysmal atrial fibrillation: Secondary | ICD-10-CM | POA: Diagnosis not present

## 2021-01-19 ENCOUNTER — Other Ambulatory Visit: Payer: Self-pay

## 2021-01-19 ENCOUNTER — Ambulatory Visit (HOSPITAL_COMMUNITY): Payer: Medicare Other | Attending: Cardiovascular Disease

## 2021-01-19 DIAGNOSIS — R0609 Other forms of dyspnea: Secondary | ICD-10-CM | POA: Diagnosis not present

## 2021-01-19 DIAGNOSIS — I480 Paroxysmal atrial fibrillation: Secondary | ICD-10-CM | POA: Insufficient documentation

## 2021-01-19 DIAGNOSIS — Z20822 Contact with and (suspected) exposure to covid-19: Secondary | ICD-10-CM | POA: Diagnosis not present

## 2021-01-19 DIAGNOSIS — R5383 Other fatigue: Secondary | ICD-10-CM | POA: Insufficient documentation

## 2021-01-19 HISTORY — PX: TRANSTHORACIC ECHOCARDIOGRAM: SHX275

## 2021-01-19 LAB — ECHOCARDIOGRAM COMPLETE
Area-P 1/2: 2.24 cm2
S' Lateral: 3.4 cm

## 2021-01-19 MED ORDER — PERFLUTREN LIPID MICROSPHERE
1.0000 mL | INTRAVENOUS | Status: AC | PRN
  Administered 2021-01-19: 2 mL via INTRAVENOUS

## 2021-02-01 DIAGNOSIS — I10 Essential (primary) hypertension: Secondary | ICD-10-CM | POA: Diagnosis not present

## 2021-02-02 ENCOUNTER — Other Ambulatory Visit: Payer: Self-pay

## 2021-02-02 ENCOUNTER — Telehealth: Payer: Self-pay | Admitting: Cardiology

## 2021-02-02 MED ORDER — NITROGLYCERIN 0.4 MG SL SUBL: 0.4000 mg | SUBLINGUAL_TABLET | SUBLINGUAL | 2 refills | Status: AC | PRN

## 2021-02-02 NOTE — Telephone Encounter (Signed)
Medication has been refilled and sent to his local pharmacy.

## 2021-02-02 NOTE — Telephone Encounter (Signed)
*  STAT* If patient is at the pharmacy, call can be transferred to refill team.   1. Which medications need to be refilled? (please list name of each medication and dose if known)  nitroGLYCERIN (NITROSTAT) 0.4 MG SL tablet  2. Which pharmacy/location (including street and city if local pharmacy) is medication to be sent to? [id number] - [place], [place] - [address]  3. Do they need a 30 day or 90 day supply?  Standard emergency supply

## 2021-02-02 NOTE — Telephone Encounter (Signed)
Patient is requesting a call back to go over echo results. °

## 2021-02-03 NOTE — Telephone Encounter (Signed)
Patient calling back. He requested another message be sent.

## 2021-02-04 NOTE — Telephone Encounter (Signed)
Pt is calling back again for his Echo results. Pt states he called Monday and Tuesday and have not heard back from anyone.

## 2021-02-05 ENCOUNTER — Other Ambulatory Visit: Payer: Self-pay

## 2021-02-05 ENCOUNTER — Ambulatory Visit (INDEPENDENT_AMBULATORY_CARE_PROVIDER_SITE_OTHER): Payer: Medicare Other | Admitting: Cardiology

## 2021-02-05 ENCOUNTER — Encounter: Payer: Self-pay | Admitting: Cardiology

## 2021-02-05 VITALS — BP 146/68 | HR 55 | Ht 69.5 in | Wt 188.0 lb

## 2021-02-05 DIAGNOSIS — I2510 Atherosclerotic heart disease of native coronary artery without angina pectoris: Secondary | ICD-10-CM | POA: Diagnosis not present

## 2021-02-05 DIAGNOSIS — I480 Paroxysmal atrial fibrillation: Secondary | ICD-10-CM | POA: Diagnosis not present

## 2021-02-05 DIAGNOSIS — Z9989 Dependence on other enabling machines and devices: Secondary | ICD-10-CM

## 2021-02-05 DIAGNOSIS — I10 Essential (primary) hypertension: Secondary | ICD-10-CM

## 2021-02-05 DIAGNOSIS — R5383 Other fatigue: Secondary | ICD-10-CM

## 2021-02-05 DIAGNOSIS — G4733 Obstructive sleep apnea (adult) (pediatric): Secondary | ICD-10-CM

## 2021-02-05 DIAGNOSIS — I25119 Atherosclerotic heart disease of native coronary artery with unspecified angina pectoris: Secondary | ICD-10-CM

## 2021-02-05 DIAGNOSIS — R5382 Chronic fatigue, unspecified: Secondary | ICD-10-CM | POA: Diagnosis not present

## 2021-02-05 DIAGNOSIS — I5042 Chronic combined systolic (congestive) and diastolic (congestive) heart failure: Secondary | ICD-10-CM

## 2021-02-05 DIAGNOSIS — Z7901 Long term (current) use of anticoagulants: Secondary | ICD-10-CM

## 2021-02-05 DIAGNOSIS — Z9861 Coronary angioplasty status: Secondary | ICD-10-CM

## 2021-02-05 DIAGNOSIS — E785 Hyperlipidemia, unspecified: Secondary | ICD-10-CM | POA: Diagnosis not present

## 2021-02-05 DIAGNOSIS — R0609 Other forms of dyspnea: Secondary | ICD-10-CM

## 2021-02-05 DIAGNOSIS — I5022 Chronic systolic (congestive) heart failure: Secondary | ICD-10-CM | POA: Diagnosis not present

## 2021-02-05 MED ORDER — SPIRONOLACTONE 25 MG PO TABS: 12.5000 mg | ORAL_TABLET | Freq: Every day | ORAL | 3 refills | Status: DC

## 2021-02-05 MED ORDER — TELMISARTAN 80 MG PO TABS: 80.0000 mg | ORAL_TABLET | Freq: Every day | ORAL | 3 refills | Status: DC

## 2021-02-05 NOTE — Telephone Encounter (Signed)
Appt moved up by Dr. Allison Quarry nurse.

## 2021-02-05 NOTE — Patient Instructions (Addendum)
Medication Instructions:    INCREASE MICARDIS( telmisartan) 80 MG DAILY   SPIRONOLACTONE 12.5 MG ( 1/2 TABLET OF 25 MG)   *If you need a refill on your cardiac medications before your next appointment, please call your pharmacy*   Lab Work: BMP  IN  7 DAYS  If you have labs (blood work) drawn today and your tests are completely normal, you will receive your results only by: [place] (if you have MyChart) OR A paper copy in the mail If you have any lab test that is abnormal or we need to change your treatment, we will call you to review the results.   Testing/Procedures:  NOT NEEDED  Follow-Up: At [facility], you and your health needs are our priority.  As part of our continuing mission to provide you with exceptional heart care, we have created designated Provider Care Teams.  These Care Teams include your primary Cardiologist (physician) and Advanced Practice Providers (APPs -  Physician Assistants and Nurse Practitioners) who all work together to provide you with the care you need, when you need it.     Your next appointment:   3 month(s)  The format for your next appointment:   In Person  Provider:   Glenetta Hew, MD    Other Instructions

## 2021-02-05 NOTE — Progress Notes (Signed)
Primary Care Provider: Lavone Orn, MD Cardiologist: Glenetta Hew, MD Electrophysiologist: None  Clinic Note: Chief Complaint  Patient presents with   Follow-up    Echo results.   Coronary Artery Disease    No longer having angina, dyspnea fatigue.   Hypertension    Blood pressure now running much higher after was very low last visit.    ===================================  ASSESSMENT/PLAN   Problem List Items Addressed This Visit       Cardiology Problems   CAD S/P PCI: Cypher DES x2 to LCx (2006); 07/2020: Additional DES stent LCx, with stent in LAD (Chronic)    He now has 2 additional stents in addition to the original Cypher stent.  He is now 6 months out from PCI, so okay to interrupt clopidogrel, but would like to continue for least 1 (if possible 2) year post PCI.      Relevant Medications   telmisartan (MICARDIS) 80 MG tablet   PAF (paroxysmal atrial fibrillation) ; CHA2DS2-VASc Score =5 (Eliquis) (Chronic)    No longer on diltiazem.  He has not had any breakthrough spells of A. fib that I could tell.  Not currently on antiarrhythmic agent, is now on modest dose carvedilol.  On Eliquis for anticoagulation.  Appropriately reduced dose.      Relevant Medications   telmisartan (MICARDIS) 80 MG tablet   Chronic combined systolic and diastolic heart failure (HCC) (Chronic)    EF by echo 40 to 45% with apical akinesis consistent with ischemic cardiomyopathy.  He is not really having active CHF symptoms of PND, orthopnea with trivial edema.  Not on loop diuretic.  He is on chlorthalidone.  Will increase Micardis back to 80 mg and start spironolactone 12.5 mg daily with recheck of labs in couple weeks.  I am not sure if his fatigue could be explained by reduced EF likely multifactorial still.  Therefore, difficult to tell his to NYHA class.  Probably class II.      Relevant Medications   telmisartan (MICARDIS) 80 MG tablet   Essential hypertension (Chronic)     Very unusual index hypotensive last visit.  Currently hypertensive and even more so at home.  He also now has reduced EF.  Plan: Continue current dose of carvedilol Increase Micardis/telmisartan 80 mg daily.  Dose was recently been increased to 40 mg Add spironolactone 12.5 mg daily Will need to check chemistry panel in roughly 1 week.      Relevant Medications   telmisartan (MICARDIS) 80 MG tablet   Other Relevant Orders   Basic metabolic panel (Completed)   Hyperlipidemia LDL goal <70 (Chronic)    Labs from May very well controlled with an LDL of 44 on current dose of rosuvastatin.  Continue current dose.  No change.      Relevant Medications   telmisartan (MICARDIS) 80 MG tablet   Coronary artery disease involving native coronary artery of native heart with angina pectoris (HCC) - Primary (Chronic)    Somewhat indolent disease.  Very difficult with multiple ongoing issues, but also on the had significant progression of disease basically occluded RCA with progression of disease in the LAD and LCx as well.  RCA is now essentially CTO with collaterals from both the medial circumflex and septal perforators/distal LAD. Both Myoview and echocardiogram suggest apical akinesis suggestive of prior occult MI.   These findings are consistent with his anatomy.  Plan: On combination of carvedilol and Micardis which will increase back to original dose. ->  With  fatigue, we may back down on carvedilol and increase Micardis further if necessary) On rosuvastatin, seems to be tolerating it relatively well.  Did not necessarily notice any change with statin holiday Converted from Brilinta to Plavix while on Eliquis. ->  Has completed 6 months of uninterrupted Plavix, okay to hold now if necessary.  No bleeding/bruising or for procedures.       Relevant Medications   telmisartan (MICARDIS) 80 MG tablet     Other   Anticoagulation adequate, Eliquis with CHA2DS2VASc of 5 (Chronic)    History  of atrial fibrillation with high elevated CHA2DS2-VASc score.  He is currently now on combination of Plavix and Eliquis.  Would like to try to get 1 year of Plavix for the old stents to heal, but if necessary can stop at 6 months post PCI. Based on renal insufficiency and age, he is appropriately on 2.5 mg twice daily Eliquis.  Plan: Continue Eliquis -> okay to hold 2 to 3 days preop for procedure Continue maintenance Plavix (will try to continue until May 2023), As of November 2022 (now), okay to hold Plavix 5 to 7 days preop for surgeries or procedures.      OSA on CPAP (Chronic)    I do think that OSA could be contributing to his fatigue, need to encourage follow-up to ensure that all is well with treatment.      Chronic fatigue    Somewhat difficult situation where I think some is due to usually deconditioning and lack of exercise.  Does have a reduced EF which goes along with his known anatomy and stress test results.  BNP was slightly elevated and TSH was a little bit elevated.  He is also not sleeping well.  Likely multifactorial.  With no ischemic symptoms, I think he is safe to try to restart maintenance cardiac rehab.     ===================================  HPI:    [name] is a 85 y.o. male with a PMH below who presents today for 51-month follow-up.  July 30, 2020 in response to Myoview stress test read as abnormal/high risk.  He was scheduled for cardiac catheterization which revealed essentially occluded RCA with significant disease in the LAD and LCx treated with staged PCI.  [name] was last seen by me on  Recent Hospitalizations:  Admitted post cath on 08/03/2020 to hydrate for staged PCI on 08/04/2020.  He was seen by Fabian Sharp, PA on 08/26/2020 for post cath follow-up.  Was doing well.  No further chest pain.  Frustrated with fatigue which had not changed since PCI.  Was told to review CPAP settings with Dr. Halford Chessman.  Was also restarted on Zoloft -  converted to Lexapro  I last saw him on September 27: .  He indicated that he was no longer noting the chest pain that he had the first month or so after his PCI.  He did have some brief bursts of palpitations but they subsequently resolved as well.  Noted feeling tired, no energy.  Orthostatic dizziness..  No CHF symptoms of PND, orthopnea or edema.  He just graduated from cardiac rehab. BP was 90/48 -diltiazem was discontinued.  Micardis dose reduced to 20 mg.  2D echo ordered  Reviewed  CV studies:    The following studies were reviewed today: (if available, images/films reviewed: From Epic Chart or Care Everywhere) 2D Echo: EF~40 to 45%.  Mildly decreased function.  Apical akinesis concerning for LAD infarction.  No LV thrombus.  Moderate concentric LVH  GR 1 DD.  Normal RV size and function.  Mild MR.  Aortic sclerosis without stenosis.  EF reduced with a wall motion abnormality;  Personal review, my results are consistent with known anatomy-occluded RCA, with severe LAD disease as well as large lateral OM.  All of these vessels would be responsible for apical perfusion.  Suspect that there have been infarcted area.  Interval History:   [name] presents here today overall really not complaining of any more chest pain.  He is actually concerned about his blood pressure being high. Since I last saw him, and reduce his blood pressure medications, his pressures been running much higher at home.  He has been having some blood pressures in the 170s and 180s. Besides feeling tired and fatigued, he really does not have any cardiac symptoms of chest pain, pressure or dyspnea with rest or exertion.  No heart failure symptoms or palpitations..  Trivial edema  Cardiovascular ROS: positive for - dyspnea on exertion, edema, and generalized fatigue, weakness and dizziness mostly orthostatic with near syncope.-Trivial edema, more exercise intolerance versus exertional dyspnea. negative for - chest  pain, irregular heartbeat, orthopnea, palpitations, paroxysmal nocturnal dyspnea, rapid heart rate, shortness of breath, or syncope/near syncope or TIA/amaurosis fugax, claudication  REVIEWED OF SYSTEMS   Review of Systems  Constitutional:  Positive for malaise/fatigue. Negative for weight loss.  HENT:  Negative for congestion and nosebleeds.   Respiratory:  Positive for shortness of breath (With exertion, but not significant). Negative for cough.   Cardiovascular:        Per HPI  Gastrointestinal:  Positive for blood in stool (Sometimes with wiping.). Negative for melena.  Genitourinary:  Negative for flank pain and hematuria.  Musculoskeletal:  Positive for back pain and joint pain.  Neurological:  Positive for dizziness, sensory change (Pedal neuropathy) and weakness (Generalized, his with legs feel weak).  Psychiatric/Behavioral:  Positive for depression (Has stabilized now switched from Zoloft to Lexapro.) and memory loss. Negative for suicidal ideas. The patient is not nervous/anxious and does not have insomnia.    I have reviewed and (if needed) personally updated the patient's problem list, medications, allergies, past medical and surgical history, social and family history.   PAST MEDICAL HISTORY   Past Medical History:  Diagnosis Date   Arthritis    CAD S/P percutaneous coronary angioplasty 08/2004   a) CARDIOLOGIST-  DR Hailyn Zarr; DES to LCx. X2  - Cypher 2.5 mm postdilated to 2.75 mm (20 mm and 13 mm stents);; Myoview 07/2020 ++ -> Cath with diffuse 90-100% CTO of p-dRCA (L-R collaterals), mid LAD 75 (SYNERGY DES 2.5 X 20 - 3.1>2.8), Mid-Distal LCx 65% & 70% prior to patent stent --> DES PCI (overlaps old) - SYNERGY 2.75 X 38 -> 3.1 mm)   Colon polyps 05/10/2005   Hyperplastic polyps   Complication of anesthesia    "takes long time to wake up"   Depression    Diabetes mellitus type 2 with complications ([place])    neuropathy; CAD borderline- no med   Diastolic dysfunction  without heart failure    Moderate LVH, Gr 1 DD on Echo 2011; no CHF admissions, no tt on diuretisc   Essential hypertension    H/O: gout STABLE   History of melanoma excision 2010   FOREHEAD   History of prostate cancer 2004--  S/P SEED IMPLANTS    NO RECURRENCE   Hydrocele, left    Hyperlipidemia LDL goal <70    Impaired hearing RIGHT HEARING  AID   OSA on CPAP    bipap   PAF (paroxysmal atrial fibrillation) ([place]) 12/11/2017   Admitted with Afib RVR - converted on IV Diltiazem. d/c on Eliquis; CHA2DS2Vasc = 5.   Prostate cancer [facility])    prostate , melanoma head   Shingles 2007   Syncope and collapse 06/2015   Likely related to post micturition, Neurontin and dehydration with orthostatic hypotension   Urgency of urination     PAST SURGICAL HISTORY   Past Surgical History:  Procedure Laterality Date   Cardiac Event Monitor  3-07/2015   Mostly SR - 58-132 bpm. Rare PVCs (occ bigeminy), Frequent PACs - singlets, couplets - short runs of PAT.   circumsision     CORONARY ANGIOPLASTY WITH STENT PLACEMENT  09/01/2004   Cypher DES x 2 d-m LCx 2.5 mm x 20 mm & 2.5 mm x 13 mm (~2.75 mm)   CORONARY STENT INTERVENTION N/A 08/04/2020   Procedure: CORONARY STENT INTERVENTION;  Surgeon: Leonie Man, MD;  Location: [place] CV LAB;  Service: Cardiovascular;; STAGED: mid LAD 75% (SYNERGY DES 2.5 X 20 --> 3.1-28 mm post-dilation; Mid LCx 65% - distal 70% prior to old stents => DES PCI (overlapps old stent & crossing OM1) SYNERGY DES 2.75 X 38 -> 3.1 mm)   EXCISION MELANOMA FROM FOREHEAD  2010   EYE SURGERY Bilateral    cataracts   HYDROCELE EXCISION  10/14/2011   Procedure: HYDROCELECTOMY ADULT;  Surgeon: Ailene Rud, MD;  Location: [facility];  Service: Urology;  Laterality: Left;   LAMINECTOMY N/A 07/13/2012   Procedure: THORACIC LAMINECTOMY FOR RESECTION OF INTRAMEDULLARY SPINAL CHORD TUMOR WITH SPINAL CHORD MONITORING;  Surgeon: Erline Levine, MD;  Location: [place]  NEURO ORS;  Service: Neurosurgery;  Laterality: N/A;  Thoracic laminectomy for resection of intramedullary spinal cord tumor with spinal cord monitering and Dr. Christella Noa to assist   LAMINECTOMY N/A 12/09/2014   Procedure: Redo Thoracic laminectomy with intramedullary tumar resection/USN/[place]/Subarachnoid shunt/spinal cord monitoring/Dr. Kathyrn Sheriff to assist;  Surgeon: Erline Levine, MD;  Location: [place] NEURO ORS;  Service: Neurosurgery;  Laterality: N/A;  Redo Thoracic laminectomy with intramedullary tumar resection/USN/[place]/Subarachnoid shunt/spinal cord monitoring/Dr. Kathyrn Sheriff to assist   LEFT HEART CATH AND CORONARY ANGIOGRAPHY N/A 08/03/2020   Procedure: LEFT HEART CATH AND CORONARY ANGIOGRAPHY;  Surgeon: Leonie Man, MD;  Location: [facility] INVASIVE CV LAB;;Proximal-mid RCA 99% with 90% side branch R VM.  Distal RCA 100%.  Proximal LAD 40%.  Mid LAD 75%.  Proximal LCx 65%.  Proximal-mid LCx 30%.  Mid LCx 70% with widely patent mid to distal LCx stent patent.  Mildly elevated LVEDP. - staged PCI LAD & LCx   NM MYOVIEW LTD  3/212014; June 2016   Both Lexiscan: a. LOW RISK, mild inferior bowel artifact; No ischemia or Infarction; b. Low risk, normal study. No infarction or ischemia. No artifact.   NM MYOVIEW LTD  07/09/2020   Fixed mid to apical anteroseptal perfusion defect with hypokinesia in this area consistent with prior infarct.  EF estimated 50%.  Also fixed inferior perfusion defect and apical defect with normal wall motion consistent with artifact.  Read as low risk, no ischemia, however there is a significant anterior defect not present on previous study.Marland Kitchen   PROSTATE PALLADIUM GOLD SEED IMPLANTS (118)  11/15/2002   PROSTATE CANCER   SKIN BIOPSY     TONSILLECTOMY     TRANSTHORACIC ECHOCARDIOGRAM  12/2017   (A. fib): Moderate concentric LVH.  EF 55 to 60%.  No wall motion normality.  Mild to moderately dilated left atrium.   Cardiac Catheterization 08/03/2020: Proximal-mid RCA 99% with 90% side  branch R VM.  Distal RCA 100%.  Proximal LAD 40%.  Mid LAD 75%.  Proximal LCx 65%.  Proximal-mid LCx 30%.  Mid LCx 70% with widely patent mid to distal LCx stent patent.  Mildly elevated LVEDP.   Staged PCI 08/04/2020:: 2-vessel PCI: Mid to distal LCx lesions treated with a single Synergy DES 2.7 mm x 38 mm postdilated to 3.0 mm.  (Overlaps previous stent, and crosses OM1 proximally)  Mid LAD lesion treated with Synergy DES 2.27mm x 20 mm (postdilated tapered from 3.1 to 2.8 mm)      Immunization History  Administered Date(s) Administered   Influenza,inj,Quad PF,6+ Mos 12/10/2014   PFIZER(Purple Top)SARS-COV-2 Vaccination 04/17/2019, 05/08/2019, 12/23/2019, 07/05/2020   Pneumococcal Polysaccharide-23 07/14/2012    MEDICATIONS/ALLERGIES   Current Meds  Medication Sig   allopurinol (ZYLOPRIM) 300 MG tablet Take 1 tablet (300 mg total) by mouth daily.   apixaban (ELIQUIS) 2.5 MG TABS tablet Take 1 tablet (2.5 mg total) by mouth 2 (two) times daily.   carvedilol (COREG) 6.25 MG tablet Take 6.25 mg by mouth 2 (two) times daily with a meal.   chlorthalidone (HYGROTON) 25 MG tablet Take 12.5 mg by mouth daily.   Cholecalciferol (VITAMIN D3) 50 MCG (2000 UT) CAPS Take 2,000 Units by mouth daily.   clopidogrel (PLAVIX) 75 MG tablet TAKE 1 TABLET BY MOUTH EVERY DAY   diclofenac Sodium (VOLTAREN) 1 % GEL Apply 2 g topically daily as needed (Pain).   escitalopram (LEXAPRO) 20 MG tablet Take 20 mg by mouth daily.   lidocaine (XYLOCAINE) 5 % ointment Apply 1 application topically daily as needed for mild pain or moderate pain.   Lidocaine 4 % PTCH Apply 1 patch topically daily as needed (Pain).   metFORMIN (GLUCOPHAGE) 500 MG tablet Take 1 tablet (500 mg total) by mouth 2 (two) times daily with a meal.   nitroGLYCERIN (NITROSTAT) 0.4 MG SL tablet Place 1 tablet (0.4 mg total) under the tongue every 5 (five) minutes as needed for chest pain.   ONETOUCH ULTRA test strip USE TO TEST BLOOD SUGAR ONCE OR  TWICE A DAY (E11.22)   oxybutynin (DITROPAN XL) 15 MG 24 hr tablet Take 1 tablet (15 mg total) by mouth at bedtime.   rosuvastatin (CRESTOR) 20 MG tablet Take 20 mg by mouth every evening.   tamsulosin (FLOMAX) 0.4 MG CAPS capsule Take 1 capsule (0.4 mg total) by mouth at bedtime.   telmisartan (MICARDIS) 80 MG tablet Take 1 tablet (80 mg total) by mouth daily.   [DISCONTINUED] spironolactone (ALDACTONE) 25 MG tablet Take 0.5 tablets (12.5 mg total) by mouth daily.   [DISCONTINUED] telmisartan (MICARDIS) 40 MG tablet Take 40 mg by mouth daily.    Allergies  Allergen Reactions   Betadine [Povidone Iodine] Anaphylaxis   Contrast Media [Iodinated Diagnostic Agents] Anaphylaxis and Rash    Other Reaction: Other reaction   Iodine Anaphylaxis    Shellfish, IVP dye. Swelling in throat and foaming at mouth.   Shellfish Allergy Anaphylaxis   Spironolactone Other (See Comments)     Hyperkalemia     SOCIAL HISTORY/FAMILY HISTORY   Reviewed in Epic:  Pertinent findings:  Social History   Tobacco Use   Smoking status: Never   Smokeless tobacco: Never   Tobacco comments:    occ alcohol  Vaping Use   Vaping Use: Never used  Substance  Use Topics   Alcohol use: Yes    Comment: OCCASIONAL   Drug use: No   Social History   Social History Narrative   He is married, father of 2, grandfather of 1. Does not really get exercise now because   of his back issues. Does not smoke and drinks social alcohol. Works in [employer].    OBJCTIVE -PE, EKG, labs   Wt Readings from Last 3 Encounters:  02/05/21 188 lb (85.3 kg)  12/29/20 191 lb (86.6 kg)  12/18/20 187 lb 13.3 oz (85.2 kg)    Physical Exam: BP (!) 146/68 (BP Location: Left Arm, Patient Position: Sitting, Cuff Size: Normal)   Pulse (!) 55   Ht 5' 9.5" (1.765 m)   Wt 188 lb (85.3 kg)   SpO2 98%   BMI 27.36 kg/m  Physical Exam Vitals reviewed.  Constitutional:      General: He is not in acute distress.    Appearance:  Normal appearance. He is normal weight. He is not ill-appearing or toxic-appearing.     Comments: Well-groomed.  He does seem to be his stated age.  HENT:     Head: Normocephalic and atraumatic.  Neck:     Vascular: No carotid bruit or JVD.  Cardiovascular:     Rate and Rhythm: Regular rhythm. Bradycardia present. No extrasystoles are present.    Chest Wall: PMI is not displaced.     Pulses: Decreased pulses (Diminished with palpable pedal pulses.  Likely due to hypotension.).     Heart sounds: S1 normal and S2 normal. Heart sounds are distant. No murmur heard.   No friction rub. No gallop.  Pulmonary:     Effort: Pulmonary effort is normal. No respiratory distress.     Breath sounds: Normal breath sounds.  Musculoskeletal:        General: No swelling. Normal range of motion.     Cervical back: Normal range of motion and neck supple.  Skin:    General: Skin is warm and dry.  Neurological:     General: No focal deficit present.     Mental Status: He is alert and oriented to person, place, and time. Mental status is at baseline.     Motor: Weakness (Legs are weak) present.  Psychiatric:        Behavior: Behavior normal.        Thought Content: Thought content normal.        Judgment: Judgment normal.     Comments: He remained somewhat down.  Not joking.  He asks about potentially restarting maintenance cardiac rehab.      Adult ECG Report N/a  Recent Labs:  reviewed  12/29/20: BNP 816 01/11/2021: Cr 1.84; K+ 4.2  Lab Results  Component Value Date   CHOL 106 08/05/2020   HDL 38 (L) 08/05/2020   LDLCALC 44 08/05/2020   TRIG 121 08/05/2020   CHOLHDL 2.8 08/05/2020   12/29/2020 BNP 816 Lab Results  Component Value Date   TSH 5.730 (H) 12/29/2020    Lab Results  Component Value Date   WBC 7.4 12/29/2020   HGB 11.8 (L) 12/29/2020   HCT 37.0 (L) 12/29/2020   MCV 93 12/29/2020   PLT 193 12/29/2020   Lab Results  Component Value Date   HGBA1C 6.2 (H) 08/03/2020    ==================================================  COVID-19 Education: The signs and symptoms of COVID-19 were discussed with the patient and how to seek care for testing (follow up with PCP or arrange E-visit).  I spent a total of 47  minutes with the patient spent in direct patient consultation.  Additional time spent with chart review  / charting (studies, outside notes, etc): 18  min Total Time: 65 min  Current medicines are reviewed at length with the patient today.  (+/- concerns) n/a  This visit occurred during the SARS-CoV-2 public health emergency.  Safety protocols were in place, including screening questions prior to the visit, additional usage of staff PPE, and extensive cleaning of exam room while observing appropriate contact time as indicated for disinfecting solutions.  Notice: This dictation was prepared with Dragon dictation along with smart phrase technology. Any transcriptional errors that result from this process are unintentional and may not be corrected upon review.  Patient Instructions / Medication Changes & Studies & Tests Ordered   Patient Instructions  Medication Instructions:    INCREASE MICARDIS( telmisartan) 80 MG DAILY   SPIRONOLACTONE 12.5 MG ( 1/2 TABLET OF 25 MG)   *If you need a refill on your cardiac medications before your next appointment, please call your pharmacy*   Lab Work: BMP  IN  7 DAYS  If you have labs (blood work) drawn today and your tests are completely normal, you will receive your results only by: [facility] (if you have MyChart) OR A paper copy in the mail If you have any lab test that is abnormal or we need to change your treatment, we will call you to review the results.   Testing/Procedures:  NOT NEEDED  Follow-Up: At [facility], you and your health needs are our priority.  As part of our continuing mission to provide you with exceptional heart care, we have created designated Provider Care Teams.  These  Care Teams include your primary Cardiologist (physician) and Advanced Practice Providers (APPs -  Physician Assistants and Nurse Practitioners) who all work together to provide you with the care you need, when you need it.     Your next appointment:   3 month(s)  The format for your next appointment:   In Person  Provider:   Glenetta Hew, MD    Other Instructions    Studies Ordered:   Orders Placed This Encounter  Procedures   Basic metabolic panel       Glenetta Hew, M.D., M.S. Interventional Cardiologist   Pager # [phone] Phone # [phone] [address] [place], [place] [address]   Thank you for choosing Heartcare at [facility]!!

## 2021-02-17 DIAGNOSIS — R0609 Other forms of dyspnea: Secondary | ICD-10-CM | POA: Diagnosis not present

## 2021-02-17 DIAGNOSIS — I10 Essential (primary) hypertension: Secondary | ICD-10-CM | POA: Diagnosis not present

## 2021-02-17 DIAGNOSIS — R5383 Other fatigue: Secondary | ICD-10-CM | POA: Diagnosis not present

## 2021-02-18 LAB — BASIC METABOLIC PANEL
BUN/Creatinine Ratio: 17 (ref 10–24)
BUN: 34 mg/dL — ABNORMAL HIGH (ref 8–27)
CO2: 22 mmol/L (ref 20–29)
Calcium: 9.2 mg/dL (ref 8.6–10.2)
Chloride: 101 mmol/L (ref 96–106)
Creatinine, Ser: 2.03 mg/dL — ABNORMAL HIGH (ref 0.76–1.27)
Glucose: 145 mg/dL — ABNORMAL HIGH (ref 70–99)
Potassium: 5 mmol/L (ref 3.5–5.2)
Sodium: 141 mmol/L (ref 134–144)
eGFR: 32 mL/min/{1.73_m2} — ABNORMAL LOW (ref >59)

## 2021-02-23 ENCOUNTER — Telehealth: Payer: Self-pay | Admitting: *Deleted

## 2021-02-23 DIAGNOSIS — R7989 Other specified abnormal findings of blood chemistry: Secondary | ICD-10-CM

## 2021-02-23 DIAGNOSIS — Z79899 Other long term (current) drug therapy: Secondary | ICD-10-CM

## 2021-02-23 DIAGNOSIS — R799 Abnormal finding of blood chemistry, unspecified: Secondary | ICD-10-CM

## 2021-02-23 DIAGNOSIS — N189 Chronic kidney disease, unspecified: Secondary | ICD-10-CM

## 2021-02-23 NOTE — Telephone Encounter (Signed)
-----   Message from Leonie Man, MD sent at 02/21/2021  8:28 PM EST ----- Unfortunately, I think we can have to stop the spironolactone.  Slight creep up in the wrong direction of kidney function and potassium level.   Stop taking spironolactone.  (Once stopped, had to allergy/intolerance- hyperkalemia) We can reassess a BMP in about 2 to 3 weeks.   Glenetta Hew, MD

## 2021-02-23 NOTE — Telephone Encounter (Signed)
Left detail message  on voicemail per DPr .  Stop spironolactone Lab ordered BMP - ( 2- 3 weeks )  WEEK of DEC 12 or Dec 19  Any question may cal back

## 2021-02-24 DIAGNOSIS — N1831 Chronic kidney disease, stage 3a: Secondary | ICD-10-CM | POA: Diagnosis not present

## 2021-02-24 DIAGNOSIS — E1122 Type 2 diabetes mellitus with diabetic chronic kidney disease: Secondary | ICD-10-CM | POA: Diagnosis not present

## 2021-02-24 DIAGNOSIS — E782 Mixed hyperlipidemia: Secondary | ICD-10-CM | POA: Diagnosis not present

## 2021-02-24 DIAGNOSIS — F325 Major depressive disorder, single episode, in full remission: Secondary | ICD-10-CM | POA: Diagnosis not present

## 2021-02-24 DIAGNOSIS — I10 Essential (primary) hypertension: Secondary | ICD-10-CM | POA: Diagnosis not present

## 2021-02-24 DIAGNOSIS — I480 Paroxysmal atrial fibrillation: Secondary | ICD-10-CM | POA: Diagnosis not present

## 2021-02-27 ENCOUNTER — Encounter: Payer: Self-pay | Admitting: Cardiology

## 2021-02-27 DIAGNOSIS — I5042 Chronic combined systolic (congestive) and diastolic (congestive) heart failure: Secondary | ICD-10-CM | POA: Insufficient documentation

## 2021-02-27 NOTE — Assessment & Plan Note (Signed)
Very unusual index hypotensive last visit.  Currently hypertensive and even more so at home.  He also now has reduced EF.  Plan:  Continue current dose of carvedilol  Increase Micardis/telmisartan 80 mg daily.  Dose was recently been increased to 40 mg  Add spironolactone 12.5 mg daily  Will need to check chemistry panel in roughly 1 week.

## 2021-02-27 NOTE — Assessment & Plan Note (Addendum)
History of atrial fibrillation with high elevated CHA2DS2-VASc score.  He is currently now on combination of Plavix and Eliquis.  Would like to try to get 1 year of Plavix for the old stents to heal, but if necessary can stop at 6 months post PCI. Based on renal insufficiency and age, he is appropriately on 2.5 mg twice daily Eliquis.  Plan:  Continue Eliquis -> okay to hold 2 to 3 days preop for procedure  Continue maintenance Plavix (will try to continue until May 2023),  As of November 2022 (now), okay to hold Plavix 5 to 7 days preop for surgeries or procedures.

## 2021-02-27 NOTE — Assessment & Plan Note (Signed)
Somewhat difficult situation where I think some is due to usually deconditioning and lack of exercise.  Does have a reduced EF which goes along with his known anatomy and stress test results.  BNP was slightly elevated and TSH was a little bit elevated.  He is also not sleeping well.  Likely multifactorial.   With no ischemic symptoms, I think he is safe to try to restart maintenance cardiac rehab.

## 2021-02-27 NOTE — Assessment & Plan Note (Signed)
No longer on diltiazem.  He has not had any breakthrough spells of A. fib that I could tell.  Not currently on antiarrhythmic agent, is now on modest dose carvedilol.  On Eliquis for anticoagulation.  Appropriately reduced dose.

## 2021-02-27 NOTE — Assessment & Plan Note (Signed)
EF by echo 40 to 45% with apical akinesis consistent with ischemic cardiomyopathy.  He is not really having active CHF symptoms of PND, orthopnea with trivial edema.  Not on loop diuretic.  He is on chlorthalidone.  Will increase Micardis back to 80 mg and start spironolactone 12.5 mg daily with recheck of labs in couple weeks.  I am not sure if his fatigue could be explained by reduced EF likely multifactorial still.  Therefore, difficult to tell his to NYHA class.  Probably class II.

## 2021-02-27 NOTE — Assessment & Plan Note (Signed)
Labs from May very well controlled with an LDL of 44 on current dose of rosuvastatin.  Continue current dose.  No change.

## 2021-02-27 NOTE — Assessment & Plan Note (Signed)
He now has 2 additional stents in addition to the original Cypher stent.  He is now 6 months out from PCI, so okay to interrupt clopidogrel, but would like to continue for least 1 (if possible 2) year post PCI.

## 2021-02-27 NOTE — Assessment & Plan Note (Signed)
Somewhat indolent disease.  Very difficult with multiple ongoing issues, but also on the had significant progression of disease basically occluded RCA with progression of disease in the LAD and LCx as well.  RCA is now essentially CTO with collaterals from both the medial circumflex and septal perforators/distal LAD. Both Myoview and echocardiogram suggest apical akinesis suggestive of prior occult MI.   These findings are consistent with his anatomy.  Plan:  On combination of carvedilol and Micardis which will increase back to original dose. ->  With fatigue, we may back down on carvedilol and increase Micardis further if necessary)  On rosuvastatin, seems to be tolerating it relatively well.  Did not necessarily notice any change with statin holiday  Converted from Brilinta to Plavix while on Eliquis. ->  Has completed 6 months of uninterrupted Plavix, okay to hold now if necessary.  No bleeding/bruising or for procedures.

## 2021-02-27 NOTE — Assessment & Plan Note (Signed)
I do think that OSA could be contributing to his fatigue, need to encourage follow-up to ensure that all is well with treatment.

## 2021-03-02 DIAGNOSIS — I10 Essential (primary) hypertension: Secondary | ICD-10-CM | POA: Diagnosis not present

## 2021-03-03 DIAGNOSIS — I10 Essential (primary) hypertension: Secondary | ICD-10-CM | POA: Diagnosis not present

## 2021-03-16 DIAGNOSIS — N281 Cyst of kidney, acquired: Secondary | ICD-10-CM | POA: Diagnosis not present

## 2021-03-16 DIAGNOSIS — E785 Hyperlipidemia, unspecified: Secondary | ICD-10-CM | POA: Diagnosis not present

## 2021-03-16 DIAGNOSIS — N1832 Chronic kidney disease, stage 3b: Secondary | ICD-10-CM | POA: Diagnosis not present

## 2021-03-16 DIAGNOSIS — E1122 Type 2 diabetes mellitus with diabetic chronic kidney disease: Secondary | ICD-10-CM | POA: Diagnosis not present

## 2021-03-16 DIAGNOSIS — I129 Hypertensive chronic kidney disease with stage 1 through stage 4 chronic kidney disease, or unspecified chronic kidney disease: Secondary | ICD-10-CM | POA: Diagnosis not present

## 2021-03-16 DIAGNOSIS — I5022 Chronic systolic (congestive) heart failure: Secondary | ICD-10-CM | POA: Diagnosis not present

## 2021-04-02 DIAGNOSIS — I10 Essential (primary) hypertension: Secondary | ICD-10-CM | POA: Diagnosis not present

## 2021-04-07 ENCOUNTER — Ambulatory Visit (INDEPENDENT_AMBULATORY_CARE_PROVIDER_SITE_OTHER): Payer: Medicare Other | Admitting: Cardiology

## 2021-04-07 ENCOUNTER — Encounter: Payer: Self-pay | Admitting: Cardiology

## 2021-04-07 ENCOUNTER — Other Ambulatory Visit: Payer: Self-pay

## 2021-04-07 VITALS — BP 140/70 | HR 48 | Ht 69.5 in | Wt 185.0 lb

## 2021-04-07 DIAGNOSIS — I25119 Atherosclerotic heart disease of native coronary artery with unspecified angina pectoris: Secondary | ICD-10-CM

## 2021-04-07 DIAGNOSIS — I480 Paroxysmal atrial fibrillation: Secondary | ICD-10-CM

## 2021-04-07 DIAGNOSIS — I700 Atherosclerosis of aorta: Secondary | ICD-10-CM | POA: Diagnosis not present

## 2021-04-07 DIAGNOSIS — Z9861 Coronary angioplasty status: Secondary | ICD-10-CM | POA: Diagnosis not present

## 2021-04-07 DIAGNOSIS — G4733 Obstructive sleep apnea (adult) (pediatric): Secondary | ICD-10-CM

## 2021-04-07 DIAGNOSIS — I5042 Chronic combined systolic (congestive) and diastolic (congestive) heart failure: Secondary | ICD-10-CM

## 2021-04-07 DIAGNOSIS — N1832 Chronic kidney disease, stage 3b: Secondary | ICD-10-CM | POA: Diagnosis not present

## 2021-04-07 DIAGNOSIS — E785 Hyperlipidemia, unspecified: Secondary | ICD-10-CM

## 2021-04-07 DIAGNOSIS — I2510 Atherosclerotic heart disease of native coronary artery without angina pectoris: Secondary | ICD-10-CM | POA: Diagnosis not present

## 2021-04-07 DIAGNOSIS — I10 Essential (primary) hypertension: Secondary | ICD-10-CM | POA: Diagnosis not present

## 2021-04-07 DIAGNOSIS — Z7901 Long term (current) use of anticoagulants: Secondary | ICD-10-CM

## 2021-04-07 DIAGNOSIS — R5382 Chronic fatigue, unspecified: Secondary | ICD-10-CM | POA: Diagnosis not present

## 2021-04-07 DIAGNOSIS — Z9989 Dependence on other enabling machines and devices: Secondary | ICD-10-CM

## 2021-04-07 MED ORDER — CARVEDILOL 6.25 MG PO TABS: 3.1250 mg | ORAL_TABLET | Freq: Two times a day (BID) | ORAL | 3 refills | Status: DC

## 2021-04-07 NOTE — Patient Instructions (Addendum)
Medication Instructions:   Decrease carvedilol 6.25 mg to 3.125 mg  ( 1/2 tablet twice a day )  Once you see  [facility]- pharmacist we can change the prescription if you like.  *If you need a refill on your cardiac medications before your next appointment, please call your pharmacy*   Lab Work: CMP- today  If you have labs (blood work) drawn today and your tests are completely normal, you will receive your results only by: [place] (if you have MyChart) OR A paper copy in the mail If you have any lab test that is abnormal or we need to change your treatment, we will call you to review the results.   Testing/Procedures:    Follow-Up: At [facility], you and your health needs are our priority.  As part of our continuing mission to provide you with exceptional heart care, we have created designated Provider Care Teams.  These Care Teams include your primary Cardiologist (physician) and Advanced Practice Providers (APPs -  Physician Assistants and Nurse Practitioners) who all work together to provide you with the care you need, when you need it.     Your next appointment:   3 to 4 month(s)  The format for your next appointment:   In Person  Provider:   Glenetta Hew, MD   Your physician recommends that you schedule a follow-up appointment in 3 to 4 weeks with [facility]-  medication Other Instructions

## 2021-04-07 NOTE — Progress Notes (Signed)
Primary Care Provider: Lavone Orn, MD Cardiologist: Glenetta Hew, MD Electrophysiologist: None  Clinic Note: Chief Complaint  Patient presents with   Follow-up    47-month follow-up.  Still feeling tired and fatigued.   Coronary Artery Disease    No recurrent chest pain or pressure.  Just fatigue   Atrial Fibrillation    He cannot tell if feeling any significant tachycardia spells.   ===================================  ASSESSMENT/PLAN   Problem List Items Addressed This Visit       Cardiology Problems   CAD S/P PCI: Cypher DES x2 to LCx (2006); 07/2020: Additional DES stent LCx, with stent in LAD (Chronic)    Significant progression of disease from 2006 until 2022 despite having relatively normal testing up until then.  He had intermittent unusual symptoms, but had a significant lesion burden on cath.  RCA is now CTO and LAD and LCx are heavily stented.   Plan: Like to continue for 4-year of clopidogrel plus Eliquis was not a bleeding issue. We will stop Plavix starting May of this year.  (May of '23).       Relevant Medications   carvedilol (COREG) 6.25 MG tablet   Other Relevant Orders   EKG 12-Lead (Completed)   Comprehensive metabolic panel (Completed)   PAF (paroxysmal atrial fibrillation) ; CHA2DS2-VASc Score =6 (Eliquis) - Primary (Chronic)    As far as I can tell, he has not any breakthrough spells of A. fib.  With reduced EF, we stopped diltiazem and continue carvedilol.  Now bradycardic.  No bleeding issues.  Plan:  reduce Coreg to 1/2 tablet twice daily. Continue Eliquis  This patients CHA2DS2-VASc Score and unadjusted Ischemic Stroke Rate (% per year) is equal to 9.7 % stroke rate/year from a score of 6  Above score calculated as 1 point each if present [CHF, HTN, DM, Vascular=MI/PAD/Aortic Plaque, Age if 65-74, or Male] Above score calculated as 2 points each if present [Age > 75, or Stroke/TIA/TE]       Relevant Medications   carvedilol  (COREG) 6.25 MG tablet   Other Relevant Orders   EKG 12-Lead (Completed)   Comprehensive metabolic panel (Completed)   Chronic combined systolic and diastolic heart failure (HCC) (Chronic)    Moderate reduced EF now 40 to 45% with apical akinesis.  I suspect this is from occluded RCA and LAD disease with lack of collateralization. Hard to tell if he is having any true active CHF symptoms or if he is just fatigued. Euvolemic exam without any edema.  No PND or orthopnea. Because of fatigue and exertional dyspnea, [place] NYHA class II symptoms.  Plan: Spironolactone was stopped presumably because of hyperkalemia.  We will switch to chlorthalidone 12.5 mg  On Micardis 80 mg which is recently increased.  Continue for now.  I suspect we may need to go up higher as we reduce the beta-blocker dose and bradycardia. Decreasing carvedilol dose to 1 Impella twice daily because of bradycardia. No standing diuretic dose.      Relevant Medications   carvedilol (COREG) 6.25 MG tablet   Other Relevant Orders   EKG 12-Lead (Completed)   Comprehensive metabolic panel (Completed)   Essential hypertension (Chronic)    Blood pressure has bounced back up again.  With having to reduce carvedilol dose, I suspect that may need to increase his ARB dose.  For now we will hold off because he is having some dizziness and fatigue.  May need some mild permissive hypertension.  Checking BMP  to reassess electrolyte status with potassium levels.  Depending on renal function and potassium levels may build to increase ARB dose.      Relevant Medications   carvedilol (COREG) 6.25 MG tablet   Hyperlipidemia LDL goal <70 (Chronic)   Relevant Medications   carvedilol (COREG) 6.25 MG tablet   Aortic atherosclerosis (HCC) (Chronic)    Incidental finding on imaging studies.  On aggressive risk modification with glycemic and lipid control as well as blood pressure control.  Remains on Plavix monotherapy.        Relevant  Medications   carvedilol (COREG) 6.25 MG tablet   Coronary artery disease involving native coronary artery of native heart with angina pectoris (HCC) (Chronic)    Slowed progression of disease until he had significant disease with occluded RCA and severe disease in the LCx and LAD.  Medical management for the RCA which is occluded.  He still had questions about how this happened and so we reviewed images together I explained that if this is probably some of the blood occurring over time and and he did not have symptoms until he progressed to the point where he did not have the LAD to assist with fusion have leads resolved.  Plan: Continue statin, ARB and beta-blocker.  Reduce beta-blocker dose in half because of bradycardia with low threshold to increase ARB dose.  On Plavix until end of May 2023      Relevant Medications   carvedilol (COREG) 6.25 MG tablet     Other   Anticoagulation adequate, Eliquis with CHA2DS2VASc of 6 (Chronic)    Currently on dual therapy, will discontinue Plavix at the end of May continue Eliquis.  No bleeding issues.      Stage 3b chronic kidney disease (CKD) (HCC) (Chronic)   Relevant Orders   Comprehensive metabolic panel (Completed)   OSA on CPAP (Chronic)    Clearly somewhat contributing to his fatigue.  Not really using CPAP routinely.      Chronic fatigue    He is a victim of the fact that he is mentally with it, but physically is not able to keep up with his brain.  His back and leg pains from his tumor put him back several years with poor balance and unsteady gait.  He is not able to really exercise that much.  He does not sleep well.  When asking was measured, it is not that he is mostly tired and sleepy.  We have been gradually reducing his medications, but now his blood pressures are going up we may need to increase. With bradycardia, I am reducing beta-blocker dose      Relevant Orders   Comprehensive metabolic panel (Completed)   ===================================  HPI:    [name] is a 86 y.o. male with a PMH notable for progressive CAD, PAF and ICM  who presents today for ~6 wkfollow-up.  May 2006: DES PCI LCx x 2 ==> several ST & Echos followed - no notable Sx.  07/2020 -> continued complaints of mild nagging symptoms that were concerning for angina => abnormal/high risk Myoview =>  Cath Aug 03, 2020 -> 3B CAD => ~CTO of RCA (w/ collaterals), severe disease of LAD as well as LCx=> Aug 04, 2020 Staged PCI LAD & LCx-OM, Med Rx of RCA Aug 26, 2020: Post-cath follow-up of ambulatory, PA-doing well.  Just frustrated with ongoing fatigue.  No chest pain. => Restarted on Zoloft (that was converted to Lexapro), and asked to reduce his Eliquis Dr.  Sood Sept 27, 2022 => still noted feeling tired and fatigued.  No energy orthostatic dizziness.  Hypotensive.  Otherwise no CHF symptoms of PND orthopnea edema.  Had just graduated from cardiac rehab. => Diltiazem discontinued and Micardis dose reduced to 20 mg.  2D echo ordered to reassess EF. 2D Echo January 19, 2021 => EF 40 to 45%.  Apical akinesis.  Consistent with RCA occlusion and LAD disease.   DMARION [name] was last seen by me on February 05, 2021 follow-up with results of his echocardiogram.  He really had any more chest pain/fatigue.  Was concerned about his blood pressure being high.  He says his pressures are reading as high as the 170s to 180s.  He is still feeling tired and fatigued but no other cardiac symptoms besides trivial edema.  Class II CHF, no angina. => No further A. fib.  On Eliquis plus Plavix until May 2023 Micardis increased to 80 mg and spironolactone 12.5 mg added.  Labs ordered Considered possibility of reducing carvedilol dose.  Converted from Brilinta to Plavix because of need for Eliquis.  Recent Hospitalizations:  None  Reviewed  CV studies:    The following studies were reviewed today: (if available, images/films reviewed: From Epic  Chart or Care Everywhere) No new studies  Interval History:   [name] presents here today now notably expressing that he is depressed.  He really is still without any energy.  He is sleeping a lot more than usual.  He is very upset because his wife having just gotten over her breast cancer she is now just had cholecystectomy surgery.  He really is the possibly of any particular activity is limited by fatigue more so than any dyspnea or chest pain.  He says that his exercise tolerance is also just because he is tired and fatigued but not because of the chest tightness or pressure.  No real significant dyspnea.  He has mild edema but no PND or orthopnea.  He is not on a loop diuretic at present, nor is he on spironolactone.  He is now on chlorthalidone at half dose.  Unfortunately, when he started cardiac rehab, he stopped related to him being very active.  Cardiovascular ROS: positive for - dyspnea on exertion, edema, and generalized fatigue, weakness and dizziness mostly orthostatic with near syncope.-Trivial edema, more exercise intolerance versus exertional dyspnea. negative for - chest pain, irregular heartbeat, orthopnea, palpitations, paroxysmal nocturnal dyspnea, rapid heart rate, shortness of breath, or syncope/near syncope or TIA/amaurosis fugax, claudication  REVIEWED OF SYSTEMS   Review of Systems  Constitutional:  Positive for malaise/fatigue. Negative for weight loss.  HENT:  Negative for congestion and nosebleeds.   Respiratory:  Positive for shortness of breath (With exertion, but not significant). Negative for cough.   Cardiovascular:        Per HPI  Gastrointestinal:  Positive for blood in stool (Sometimes with wiping.). Negative for melena.  Genitourinary:  Negative for flank pain and hematuria.  Musculoskeletal:  Positive for back pain and joint pain.  Neurological:  Positive for dizziness, sensory change (Pedal neuropathy) and weakness (Generalized, his with legs  feel weak).  Psychiatric/Behavioral:  Positive for depression (Has stabilized now switched from Zoloft to Lexapro.) and memory loss. Negative for suicidal ideas. The patient is not nervous/anxious and does not have insomnia.    I have reviewed and (if needed) personally updated the patient's problem list, medications, allergies, past medical and surgical history, social and family history.   PAST  MEDICAL HISTORY   Past Medical History:  Diagnosis Date   Arthritis    CAD S/P percutaneous coronary angioplasty 08/2004   a) CARDIOLOGIST-  DR Deaja Rizo; DES to LCx. X2  - Cypher 2.5 mm postdilated to 2.75 mm (20 mm and 13 mm stents);; Myoview 07/2020 ++ -> Cath with diffuse 90-100% CTO of p-dRCA (L-R collaterals), mid LAD 75 (SYNERGY DES 2.5 X 20 - 3.1>2.8), Mid-Distal LCx 65% & 70% prior to patent stent --> DES PCI (overlaps old) - SYNERGY 2.75 X 38 -> 3.1 mm)   Colon polyps 05/10/2005   Hyperplastic polyps   Complication of anesthesia    "takes long time to wake up"   Depression    Diabetes mellitus type 2 with complications ([place])    neuropathy; CAD borderline- no med   Diastolic dysfunction without heart failure    Moderate LVH, Gr 1 DD on Echo 2011; no CHF admissions, no tt on diuretisc   Essential hypertension    H/O: gout STABLE   History of melanoma excision 2010   FOREHEAD   History of prostate cancer 2004--  S/P SEED IMPLANTS    NO RECURRENCE   Hydrocele, left    Hyperlipidemia LDL goal <70    Impaired hearing RIGHT HEARING AID   OSA on CPAP    bipap   PAF (paroxysmal atrial fibrillation) ([place]) 12/11/2017   Admitted with Afib RVR - converted on IV Diltiazem. d/c on Eliquis; CHA2DS2Vasc = 5.   Prostate cancer [facility])    prostate , melanoma head   Shingles 2007   Syncope and collapse 06/2015   Likely related to post micturition, Neurontin and dehydration with orthostatic hypotension   Urgency of urination     PAST SURGICAL HISTORY   Past Surgical History:  Procedure  Laterality Date   Cardiac Event Monitor  3-07/2015   Mostly SR - 58-132 bpm. Rare PVCs (occ bigeminy), Frequent PACs - singlets, couplets - short runs of PAT.   circumsision     CORONARY ANGIOPLASTY WITH STENT PLACEMENT  09/01/2004   Cypher DES x 2 d-m LCx 2.5 mm x 20 mm & 2.5 mm x 13 mm (~2.75 mm)   CORONARY STENT INTERVENTION N/A 08/04/2020   Procedure: CORONARY STENT INTERVENTION;  Surgeon: Leonie Man, MD;  Location: [place] [facility];  Service: Cardiovascular;; STAGED: mid LAD 75% (SYNERGY DES 2.5 X 20 --> 3.1-28 mm post-dilation; Mid LCx 65% - distal 70% prior to old stents => DES PCI (overlapps old stent & crossing OM1) SYNERGY DES 2.75 X 38 -> 3.1 mm)   EXCISION MELANOMA FROM FOREHEAD  2010   EYE SURGERY Bilateral    cataracts   HYDROCELE EXCISION  10/14/2011   Procedure: HYDROCELECTOMY ADULT;  Surgeon: Ailene Rud, MD;  Location: [facility];  Service: Urology;  Laterality: Left;   LAMINECTOMY N/A 07/13/2012   Procedure: THORACIC LAMINECTOMY FOR RESECTION OF INTRAMEDULLARY SPINAL CHORD TUMOR WITH SPINAL CHORD MONITORING;  Surgeon: Erline Levine, MD;  Location: [place] [facility];  Service: Neurosurgery;  Laterality: N/A;  Thoracic laminectomy for resection of intramedullary spinal cord tumor with spinal cord monitering and Dr. Christella Noa to assist   LAMINECTOMY N/A 12/09/2014   Procedure: Redo Thoracic laminectomy with intramedullary tumar resection/USN/[place]/Subarachnoid shunt/spinal cord monitoring/Dr. Kathyrn Sheriff to assist;  Surgeon: Erline Levine, MD;  Location: [facility];  Service: Neurosurgery;  Laterality: N/A;  Redo Thoracic laminectomy with intramedullary tumar resection/USN/[place]/Subarachnoid shunt/spinal cord monitoring/Dr. Kathyrn Sheriff to assist   LEFT HEART CATH AND CORONARY  ANGIOGRAPHY N/A 08/03/2020   Procedure: LEFT HEART CATH AND CORONARY ANGIOGRAPHY;  Surgeon: Leonie Man, MD;  Location: [facility] IN[facility]IVE [facility];;Proximal-mid RCA 99% with 90% side branch R  VM.  Distal RCA 100%.  Proximal LAD 40%.  Mid LAD 75%.  Proximal LCx 65%.  Proximal-mid LCx 30%.  Mid LCx 70% with widely patent mid to distal LCx stent patent.  Mildly elevated LVEDP. - staged PCI LAD & LCx   NM MYOVIEW LTD  3/212014; June 2016   Both Lexiscan: a. LOW RISK, mild inferior bowel artifact; No ischemia or Infarction; b. Low risk, normal study. No infarction or ischemia. No artifact.   NM MYOVIEW LTD  07/09/2020   Fixed mid to apical anteroseptal perfusion defect with hypokinesia in this area consistent with prior infarct.  EF estimated 50%.  Also fixed inferior perfusion defect and apical defect with normal wall motion consistent with artifact.  Read as low risk, no ischemia, however there is a significant anterior defect not present on previous study.Marland Kitchen   PROSTATE PALLADIUM GOLD SEED IMPLANTS (118)  11/15/2002   PROSTATE CANCER   SKIN BIOPSY     TONSILLECTOMY     TRANSTHORACIC ECHOCARDIOGRAM  12/2017   (A. fib): Moderate concentric LVH.  EF 55 to 60%.  No wall motion normality.  Mild to moderately dilated left atrium.   Cardiac Catheterization 08/03/2020: Prox-mid RCA 99% with 90% side branch R VM.  Distal RCA 100%.  ProxLAD 40%.  Mid LAD 75%.  Proximal LCx 65%.  Prox-mid LCx 30%.  Mid LCx 70% with widely patent mid to distal LCx stent.  Mildly elevated LVEDP.   Staged PCI 08/04/2020:: 2-vessel PCI: Mid to distal LCx lesions treated with a single Synergy DES 2.7 mm x 38 mm postdilated to 3.0 mm.  (Overlaps previous stent, and crosses OM1 proximally)  Mid LAD lesion treated with Synergy DES 2.33mm x 20 mm (postdilated tapered from 3.1 to 2.8 mm)    2D Echo 01/19/2021: EF~40 to 45%.  Mildly decreased function.  Apical akinesis concerning for LAD infarction.  No LV thrombus.  Moderate concentric LVH GR 1 DD.  Normal RV size and function.  Mild MR.  Aortic sclerosis without stenosis.  EF reduced with a wall motion abnormality;  Personal review, my results are consistent with known  anatomy-occluded RCA, with severe LAD disease as well as large lateral OM.  All of these vessels would be responsible for apical perfusion.  Suspect that there have been infarcted area.  Immunization History  Administered Date(s) Administered   Influenza,inj,Quad PF,6+ Mos 12/10/2014   PFIZER(Purple Top)SARS-COV-2 Vaccination 04/17/2019, 05/08/2019, 12/23/2019, 07/05/2020   Pneumococcal Polysaccharide-23 07/14/2012    MEDICATIONS/ALLERGIES   Current Meds  Medication Sig   allopurinol (ZYLOPRIM) 300 MG tablet Take 1 tablet (300 mg total) by mouth daily.   apixaban (ELIQUIS) 2.5 MG TABS tablet Take 1 tablet (2.5 mg total) by mouth 2 (two) times daily.   chlorthalidone (HYGROTON) 25 MG tablet Take 12.5 mg by mouth daily.   Cholecalciferol (VITAMIN D3) 50 MCG (2000 UT) CAPS Take 2,000 Units by mouth daily.   clopidogrel (PLAVIX) 75 MG tablet TAKE 1 TABLET BY MOUTH EVERY DAY   diclofenac Sodium (VOLTAREN) 1 % GEL Apply 2 g topically daily as needed (Pain).   escitalopram (LEXAPRO) 20 MG tablet Take 20 mg by mouth daily.   metFORMIN (GLUCOPHAGE) 500 MG tablet Take 1 tablet (500 mg total) by mouth 2 (two) times daily with a meal.   nitroGLYCERIN (NITROSTAT) 0.4  MG SL tablet Place 1 tablet (0.4 mg total) under the tongue every 5 (five) minutes as needed for chest pain.   ONETOUCH ULTRA test strip USE TO TEST BLOOD SUGAR ONCE OR TWICE A DAY (E11.22)   oxybutynin (DITROPAN XL) 15 MG 24 hr tablet Take 1 tablet (15 mg total) by mouth at bedtime.   rosuvastatin (CRESTOR) 20 MG tablet Take 20 mg by mouth every evening.   tamsulosin (FLOMAX) 0.4 MG CAPS capsule Take 1 capsule (0.4 mg total) by mouth at bedtime.   telmisartan (MICARDIS) 80 MG tablet Take 1 tablet (80 mg total) by mouth daily.   [DISCONTINUED] carvedilol (COREG) 6.25 MG tablet Take 6.25 mg by mouth 2 (two) times daily with a meal.    Allergies  Allergen Reactions   Betadine [Povidone Iodine] Anaphylaxis   Contrast Media [Iodinated  Contrast Media] Anaphylaxis and Rash    Other Reaction: Other reaction   Iodine Anaphylaxis    Shellfish, IVP dye. Swelling in throat and foaming at mouth.   Shellfish Allergy Anaphylaxis   Spironolactone Other (See Comments)     Hyperkalemia    Gabapentin Other (See Comments)    SOCIAL HISTORY/FAMILY HISTORY   Reviewed in Epic:  Pertinent findings:  Social History   Tobacco Use   Smoking status: Never   Smokeless tobacco: Never   Tobacco comments:    occ alcohol  Vaping Use   Vaping Use: Never used  Substance Use Topics   Alcohol use: Yes    Comment: OCCASIONAL   Drug use: No   Social History   Social History Narrative   He is married, father of 2, grandfather of 58. Does not really get exercise now because   of his back issues. Does not smoke and drinks social alcohol. Works in [employer].    OBJCTIVE -PE, EKG, labs   Wt Readings from Last 3 Encounters:  04/07/21 185 lb (83.9 kg)  02/05/21 188 lb (85.3 kg)  12/29/20 191 lb (86.6 kg)    Physical Exam: BP 140/70 (BP Location: Left Arm)    Pulse (!) 48    Ht 5' 9.5" (1.765 m)    Wt 185 lb (83.9 kg)    SpO2 94%    BMI 26.93 kg/m  Physical Exam Vitals reviewed.  Constitutional:      General: He is not in acute distress.    Appearance: Normal appearance. He is normal weight. He is not ill-appearing or toxic-appearing.     Comments: Well-groomed.  He does seem to be his stated age.  HENT:     Head: Normocephalic and atraumatic.  Neck:     Vascular: No carotid bruit or JVD.  Cardiovascular:     Rate and Rhythm: Regular rhythm. Bradycardia present. No extrasystoles are present.    Chest Wall: PMI is not displaced.     Pulses: Decreased pulses (Diminished with palpable pedal pulses.  Likely due to hypotension.).     Heart sounds: S1 normal and S2 normal. Heart sounds are distant. No murmur heard.   No friction rub. No gallop.  Pulmonary:     Effort: Pulmonary effort is normal. No respiratory distress.      Breath sounds: Normal breath sounds.  Musculoskeletal:        General: No swelling. Normal range of motion.     Cervical back: Normal range of motion and neck supple.  Skin:    General: Skin is warm and dry.  Neurological:     General: No focal deficit  present.     Mental Status: He is alert and oriented to person, place, and time. Mental status is at baseline.     Motor: Weakness (Legs are weak) present.  Psychiatric:        Behavior: Behavior normal.        Thought Content: Thought content normal.        Judgment: Judgment normal.     Comments: He remained somewhat down.  Not joking.  He asks about potentially restarting maintenance cardiac rehab.      Adult ECG Report N/a  Recent Labs:  reviewed  12/29/20: BNP 816 01/11/2021: Cr 1.84; K+ 4.2  Lab Results  Component Value Date   CHOL 106 08/05/2020   HDL 38 (L) 08/05/2020   LDLCALC 44 08/05/2020   TRIG 121 08/05/2020   CHOLHDL 2.8 08/05/2020   12/29/2020 BNP 816 Lab Results  Component Value Date   TSH 5.730 (H) 12/29/2020    Lab Results  Component Value Date   WBC 7.4 12/29/2020   HGB 11.8 (L) 12/29/2020   HCT 37.0 (L) 12/29/2020   MCV 93 12/29/2020   PLT 193 12/29/2020   Lab Results  Component Value Date   HGBA1C 6.2 (H) 08/03/2020   ==================================================  COVID-19 Education: The signs and symptoms of COVID-19 were discussed with the patient and how to seek care for testing (follow up with PCP or arrange E-visit).    I spent a total of 38 minutes with the patient spent in direct patient consultation.  Additional time spent with chart review  / charting (studies, outside notes, etc): 17 min Total Time: 55 min  Current medicines are reviewed at length with the patient today.  (+/- concerns) n/a  This visit occurred during the SARS-CoV-2 public health emergency.  Safety protocols were in place, including screening questions prior to the visit, additional usage of staff PPE, and  extensive cleaning of exam room while observing appropriate contact time as indicated for disinfecting solutions.  Notice: This dictation was prepared with Dragon dictation along with smart phrase technology. Any transcriptional errors that result from this process are unintentional and may not be corrected upon review.  Patient Instructions / Medication Changes & Studies & Tests Ordered   Patient Instructions  Medication Instructions:   Decrease carvedilol 6.25 mg to 3.125 mg  ( 1/2 tablet twice a day )  Once you see  [facility]- pharmacist we can change the prescription if you like.  *If you need a refill on your cardiac medications before your next appointment, please call your pharmacy*   Lab Work: CMP- today  If you have labs (blood work) drawn today and your tests are completely normal, you will receive your results only by: [place] (if you have MyChart) OR A paper copy in the mail If you have any lab test that is abnormal or we need to change your treatment, we will call you to review the results.   Testing/Procedures:    Follow-Up: At [facility], you and your health needs are our priority.  As part of our continuing mission to provide you with exceptional heart care, we have created designated Provider Care Teams.  These Care Teams include your primary Cardiologist (physician) and Advanced Practice Providers (APPs -  Physician Assistants and Nurse Practitioners) who all work together to provide you with the care you need, when you need it.     Your next appointment:   3 to 4 month(s)  The format for your next appointment:  In Person  Provider:   Glenetta Hew, MD   Your physician recommends that you schedule a follow-up appointment in 3 to 4 weeks with [facility]-  medication Other Instructions    Studies Ordered:   Orders Placed This Encounter  Procedures   Comprehensive metabolic panel   EKG [id number]       Glenetta Hew, M.D., M.S. Interventional  Cardiologist   Pager # [phone] Phone # [phone] [address] [place], [place] [address]   Thank you for choosing Heartcare at [facility]!!

## 2021-04-08 LAB — COMPREHENSIVE METABOLIC PANEL
ALT: 9 IU/L (ref 0–44)
AST: 15 IU/L (ref 0–40)
Albumin/Globulin Ratio: 2.2 (ref 1.2–2.2)
Albumin: 4.2 g/dL (ref 3.6–4.6)
Alkaline Phosphatase: 57 IU/L (ref 44–121)
BUN/Creatinine Ratio: 20 (ref 10–24)
BUN: 42 mg/dL — ABNORMAL HIGH (ref 8–27)
Bilirubin Total: 0.5 mg/dL (ref 0.0–1.2)
CO2: 22 mmol/L (ref 20–29)
Calcium: 8.9 mg/dL (ref 8.6–10.2)
Chloride: 101 mmol/L (ref 96–106)
Creatinine, Ser: 2.12 mg/dL — ABNORMAL HIGH (ref 0.76–1.27)
Globulin, Total: 1.9 g/dL (ref 1.5–4.5)
Glucose: 80 mg/dL (ref 70–99)
Potassium: 4.5 mmol/L (ref 3.5–5.2)
Sodium: 135 mmol/L (ref 134–144)
Total Protein: 6.1 g/dL (ref 6.0–8.5)
eGFR: 30 mL/min/{1.73_m2} — ABNORMAL LOW (ref >59)

## 2021-04-17 ENCOUNTER — Encounter: Payer: Self-pay | Admitting: Cardiology

## 2021-04-17 DIAGNOSIS — I700 Atherosclerosis of aorta: Secondary | ICD-10-CM | POA: Insufficient documentation

## 2021-04-17 NOTE — Assessment & Plan Note (Addendum)
Blood pressure has bounced back up again.  With having to reduce carvedilol dose, I suspect that may need to increase his ARB dose.  For now we will hold off because he is having some dizziness and fatigue.  May need some mild permissive hypertension.  Checking BMP to reassess electrolyte status with potassium levels.  Depending on renal function and potassium levels may build to increase ARB dose.

## 2021-04-17 NOTE — Assessment & Plan Note (Signed)
Clearly somewhat contributing to his fatigue.  Not really using CPAP routinely.

## 2021-04-17 NOTE — Assessment & Plan Note (Addendum)
As far as I can tell, he has not any breakthrough spells of A. fib.  With reduced EF, we stopped diltiazem and continue carvedilol.  Now bradycardic.  No bleeding issues.  Plan:   reduce Coreg to 1/2 tablet twice daily.  Continue Eliquis  This patients CHA2DS2-VASc Score and unadjusted Ischemic Stroke Rate (% per year) is equal to 9.7 % stroke rate/year from a score of 6  Above score calculated as 1 point each if present [CHF, HTN, DM, Vascular=MI/PAD/Aortic Plaque, Age if 65-74, or Male] Above score calculated as 2 points each if present [Age > 75, or Stroke/TIA/TE]

## 2021-04-17 NOTE — Assessment & Plan Note (Signed)
Moderate reduced EF now 40 to 45% with apical akinesis.  I suspect this is from occluded RCA and LAD disease with lack of collateralization. Hard to tell if he is having any true active CHF symptoms or if he is just fatigued. Euvolemic exam without any edema.  No PND or orthopnea. Because of fatigue and exertional dyspnea, [place] NYHA class II symptoms.  Plan:  Spironolactone was stopped presumably because of hyperkalemia.  We will switch to chlorthalidone 12.5 mg   On Micardis 80 mg which is recently increased.  Continue for now.  I suspect we may need to go up higher as we reduce the beta-blocker dose and bradycardia.  Decreasing carvedilol dose to 1 Impella twice daily because of bradycardia.  No standing diuretic dose.

## 2021-04-17 NOTE — Assessment & Plan Note (Signed)
He is a victim of the fact that he is mentally with it, but physically is not able to keep up with his brain.  His back and leg pains from his tumor put him back several years with poor balance and unsteady gait.  He is not able to really exercise that much.  He does not sleep well.  When asking was measured, it is not that he is mostly tired and sleepy.  We have been gradually reducing his medications, but now his blood pressures are going up we may need to increase. With bradycardia, I am reducing beta-blocker dose

## 2021-04-17 NOTE — Assessment & Plan Note (Signed)
Significant progression of disease from 2006 until 2022 despite having relatively normal testing up until then.  He had intermittent unusual symptoms, but had a significant lesion burden on cath.  RCA is now CTO and LAD and LCx are heavily stented.   Plan: Like to continue for 4-year of clopidogrel plus Eliquis was not a bleeding issue.  We will stop Plavix starting May of this year.  (May of '23).

## 2021-04-17 NOTE — Assessment & Plan Note (Signed)
Incidental finding on imaging studies.  On aggressive risk modification with glycemic and lipid control as well as blood pressure control.  Remains on Plavix monotherapy.

## 2021-04-17 NOTE — Assessment & Plan Note (Signed)
Currently on dual therapy, will discontinue Plavix at the end of May continue Eliquis.  No bleeding issues.

## 2021-04-17 NOTE — Assessment & Plan Note (Signed)
Slowed progression of disease until he had significant disease with occluded RCA and severe disease in the LCx and LAD.  Medical management for the RCA which is occluded.  He still had questions about how this happened and so we reviewed images together I explained that if this is probably some of the blood occurring over time and and he did not have symptoms until he progressed to the point where he did not have the LAD to assist with fusion have leads resolved.  Plan: Continue statin, ARB and beta-blocker.  Reduce beta-blocker dose in half because of bradycardia with low threshold to increase ARB dose.  On Plavix until end of May 2023

## 2021-04-28 ENCOUNTER — Other Ambulatory Visit: Payer: Self-pay

## 2021-04-28 ENCOUNTER — Ambulatory Visit (INDEPENDENT_AMBULATORY_CARE_PROVIDER_SITE_OTHER): Payer: Medicare Other | Admitting: Pharmacist

## 2021-04-28 VITALS — BP 153/77 | HR 53 | Resp 14 | Ht 69.0 in | Wt 188.0 lb

## 2021-04-28 DIAGNOSIS — I2510 Atherosclerotic heart disease of native coronary artery without angina pectoris: Secondary | ICD-10-CM

## 2021-04-28 DIAGNOSIS — F325 Major depressive disorder, single episode, in full remission: Secondary | ICD-10-CM | POA: Insufficient documentation

## 2021-04-28 DIAGNOSIS — I5042 Chronic combined systolic (congestive) and diastolic (congestive) heart failure: Secondary | ICD-10-CM

## 2021-04-28 DIAGNOSIS — I701 Atherosclerosis of renal artery: Secondary | ICD-10-CM | POA: Insufficient documentation

## 2021-04-28 DIAGNOSIS — Z9861 Coronary angioplasty status: Secondary | ICD-10-CM

## 2021-04-28 DIAGNOSIS — M109 Gout, unspecified: Secondary | ICD-10-CM | POA: Insufficient documentation

## 2021-04-28 DIAGNOSIS — I10 Essential (primary) hypertension: Secondary | ICD-10-CM

## 2021-04-28 DIAGNOSIS — H9190 Unspecified hearing loss, unspecified ear: Secondary | ICD-10-CM | POA: Insufficient documentation

## 2021-04-28 DIAGNOSIS — E1121 Type 2 diabetes mellitus with diabetic nephropathy: Secondary | ICD-10-CM | POA: Insufficient documentation

## 2021-04-28 DIAGNOSIS — N182 Chronic kidney disease, stage 2 (mild): Secondary | ICD-10-CM | POA: Insufficient documentation

## 2021-04-28 DIAGNOSIS — R159 Full incontinence of feces: Secondary | ICD-10-CM | POA: Insufficient documentation

## 2021-04-28 DIAGNOSIS — R269 Unspecified abnormalities of gait and mobility: Secondary | ICD-10-CM | POA: Insufficient documentation

## 2021-04-28 MED ORDER — OLMESARTAN MEDOXOMIL-HCTZ 40-12.5 MG PO TABS: 1.0000 | ORAL_TABLET | Freq: Every day | ORAL | 2 refills | Status: DC

## 2021-04-28 NOTE — Patient Instructions (Addendum)
It was nice meeting you today  We will stop your chlorthalidone and telmisartan at this time to reduce the amount of tablets your are taking  We will start a new medication that is a combination pill to take its place.  It is called olmesartan/HCTZ 40/12.5mg  once a day in the morning  Discontinue your allopurinol at this time  Continue to take your blood pressure at home  Karren Cobble, PharmD, [place], [place], [place], [place], [place], [address] Phone: [phone], Fax: [phone]

## 2021-04-28 NOTE — Progress Notes (Signed)
Patient ID: [name]                 DOB: [birth year]                      MRN: [id number]     HPI: [name] is an 86 y/o male with a history of significant CAD S/P PCI, hypertension, A-fib with a CHAD2DS2-VASc of 6, chronic combined systolic and diastolic HF, OSA, diabetes, neurogenic bowel, and CKD here today to follow-up for medication management. Had an ECHO on 01/19/2021 that showed LVEF of 40-45%. He underwent a PCI in 07/2020 and has been on Eliquis and Plavix since then. Dr. Ellyn [name] was wanting to keep him on both Eliquis and Plavix for 1 year, with his Plavix stop date in May 2023. He has reported no bleeding issues.   Has previously reported significant fatigue which cannot be pinpointed to true active HF symptoms or to fatigue in general. He has had a hard time as his wife recently got over breast cancer but then had cholecystectomy surgery.   He was last seen by Dr. Ellyn [name] on 04/07/2021 where his blood pressure was 140/70 and HR 48. He and Dr. Ellyn [name] have been working to decrease his medications. At his last visit he was bradycardic and had to decrease his dose of carvedilol from 6.25 mg BID to 3.125 mg BID.   Patient presents with wife today reporting that he was doing OK but still feeling fatigued. Feels like he could fall asleep anytime when he is sitting down, waiting for his wife while she is at doctors appointments, and even at restaurants when waiting for his food. Said he only feels awake and active when he is at the casino. His wife reported that his blood pressures were still running high in the 093-235T systolic. He said that he does not notice feeling any better but thinks this is due to his heart having all of the blockages.    He is discouraged by the amount of meds he is taking.  Remains on allopurinol but has not had any gout flares in 20 years.  Current HTN meds:   Carvedilol 3.125mg  BID Telmisartan 80mg  daily Chlorthalidone 12.5mg  daily  BP goal:  <140/90 due to comorbidities and age  Social History: Drinks alcohol while in casinos and on cruises  Diet: Does not watch his diet. Wife reports he eats junk food constantly  Wt Readings from Last 3 Encounters:  04/07/21 185 lb (83.9 kg)  02/05/21 188 lb (85.3 kg)  12/29/20 191 lb (86.6 kg)   BP Readings from Last 3 Encounters:  04/07/21 140/70  02/05/21 (!) 146/68  12/29/20 (!) 90/48   Pulse Readings from Last 3 Encounters:  04/07/21 (!) 48  02/05/21 (!) 55  12/29/20 (!) 46    Renal function: CrCl cannot be calculated (Unknown ideal weight.).  Past Medical History:  Diagnosis Date   Arthritis    CAD S/P percutaneous coronary angioplasty 08/2004   a) CARDIOLOGIST-  DR [name]; DES to LCx. X2  - Cypher 2.5 mm postdilated to 2.75 mm (20 mm and 13 mm stents);; Myoview 07/2020 ++ -> Cath with diffuse 90-100% CTO of p-dRCA (L-R collaterals), mid LAD 75 (SYNERGY DES 2.5 X 20 - 3.1>2.8), Mid-Distal LCx 65% & 70% prior to patent stent --> DES PCI (overlaps old) - SYNERGY 2.75 X 38 -> 3.1 mm)   Colon polyps 05/10/2005   Hyperplastic polyps   Complication of anesthesia    "  takes long time to wake up"   Depression    Diabetes mellitus type 2 with complications ([place])    neuropathy; CAD borderline- no med   Diastolic dysfunction without heart failure    Moderate LVH, Gr 1 DD on Echo 2011; no CHF admissions, no tt on diuretisc   Essential hypertension    H/O: gout STABLE   History of melanoma excision 2010   FOREHEAD   History of prostate cancer 2004--  S/P SEED IMPLANTS    NO RECURRENCE   Hydrocele, left    Hyperlipidemia LDL goal <70    Impaired hearing RIGHT HEARING AID   OSA on CPAP    bipap   PAF (paroxysmal atrial fibrillation) ([place]) 12/11/2017   Admitted with Afib RVR - converted on IV Diltiazem. d/c on Eliquis; CHA2DS2Vasc = 5.   Prostate cancer [facility])    prostate , melanoma head   Shingles 2007   Syncope and collapse 06/2015   Likely related to post  micturition, Neurontin and dehydration with orthostatic hypotension   Urgency of urination     Current Outpatient Medications on File Prior to Visit  Medication Sig Dispense Refill   allopurinol (ZYLOPRIM) 300 MG tablet Take 1 tablet (300 mg total) by mouth daily. 30 tablet 1   apixaban (ELIQUIS) 2.5 MG TABS tablet Take 1 tablet (2.5 mg total) by mouth 2 (two) times daily. 180 tablet 2   carvedilol (COREG) 6.25 MG tablet Take 0.5 tablets (3.125 mg total) by mouth 2 (two) times daily with a meal. 90 tablet 3   chlorthalidone (HYGROTON) 25 MG tablet Take 12.5 mg by mouth daily.     Cholecalciferol (VITAMIN D3) 50 MCG (2000 UT) CAPS Take 2,000 Units by mouth daily.     clopidogrel (PLAVIX) 75 MG tablet TAKE 1 TABLET BY MOUTH EVERY DAY 90 tablet 2   diclofenac Sodium (VOLTAREN) 1 % GEL Apply 2 g topically daily as needed (Pain).     escitalopram (LEXAPRO) 20 MG tablet Take 20 mg by mouth daily.     metFORMIN (GLUCOPHAGE) 500 MG tablet Take 1 tablet (500 mg total) by mouth 2 (two) times daily with a meal.     nitroGLYCERIN (NITROSTAT) 0.4 MG SL tablet Place 1 tablet (0.4 mg total) under the tongue every 5 (five) minutes as needed for chest pain. 25 tablet 2   ONETOUCH ULTRA test strip USE TO TEST BLOOD SUGAR ONCE OR TWICE A DAY (E11.22)     oxybutynin (DITROPAN XL) 15 MG 24 hr tablet Take 1 tablet (15 mg total) by mouth at bedtime. 30 tablet 1   rosuvastatin (CRESTOR) 20 MG tablet Take 20 mg by mouth every evening.     tamsulosin (FLOMAX) 0.4 MG CAPS capsule Take 1 capsule (0.4 mg total) by mouth at bedtime. 30 capsule 1   telmisartan (MICARDIS) 80 MG tablet Take 1 tablet (80 mg total) by mouth daily. 90 tablet 3   No current facility-administered medications on file prior to visit.    Allergies  Allergen Reactions   Betadine [Povidone Iodine] Anaphylaxis   Contrast Media [Iodinated Contrast Media] Anaphylaxis and Rash    Other Reaction: Other reaction   Iodine Anaphylaxis    Shellfish, IVP  dye. Swelling in throat and foaming at mouth.   Shellfish Allergy Anaphylaxis   Spironolactone Other (See Comments)     Hyperkalemia    Gabapentin Other (See Comments)     Assessment/Plan:  1. Hypertension -  Patient BP in room today 153/77 which is  above goal of <140/90. Hypertension likely multi contributory due to stress and poor diet choices.  Discussed importance of limiting salt and carbohydrates.  Patient renal function poor.  Asked about allopurinol and his history of gout. Since he has not had a out flare in 20 years and creatinine clearance 32 ml/min, advised he qualifies for a dose reduction.  However, patient chooses to discontinue altogether.  To help reduce pill burden, will d/c telmisartan and chlorthalidone and start combo pill of olmesartan/HCTZ.  Advised patient to begin checking BP at home more frequently.  Patient pleased with changes. Recheck in 4 weeks.  Karren Cobble, PharmD, BCACP, [place], [place], [place], [place], [address] Phone: [phone], Fax: [phone]

## 2021-04-30 DIAGNOSIS — E782 Mixed hyperlipidemia: Secondary | ICD-10-CM | POA: Diagnosis not present

## 2021-04-30 DIAGNOSIS — I480 Paroxysmal atrial fibrillation: Secondary | ICD-10-CM | POA: Diagnosis not present

## 2021-04-30 DIAGNOSIS — E1122 Type 2 diabetes mellitus with diabetic chronic kidney disease: Secondary | ICD-10-CM | POA: Diagnosis not present

## 2021-04-30 DIAGNOSIS — I10 Essential (primary) hypertension: Secondary | ICD-10-CM | POA: Diagnosis not present

## 2021-05-12 ENCOUNTER — Encounter: Payer: Self-pay | Admitting: Pharmacist

## 2021-05-21 DIAGNOSIS — I10 Essential (primary) hypertension: Secondary | ICD-10-CM | POA: Diagnosis not present

## 2021-05-21 DIAGNOSIS — R159 Full incontinence of feces: Secondary | ICD-10-CM | POA: Diagnosis not present

## 2021-05-21 DIAGNOSIS — M545 Low back pain, unspecified: Secondary | ICD-10-CM | POA: Diagnosis not present

## 2021-05-21 DIAGNOSIS — R634 Abnormal weight loss: Secondary | ICD-10-CM | POA: Diagnosis not present

## 2021-05-21 DIAGNOSIS — R32 Unspecified urinary incontinence: Secondary | ICD-10-CM | POA: Diagnosis not present

## 2021-05-21 DIAGNOSIS — R5383 Other fatigue: Secondary | ICD-10-CM | POA: Diagnosis not present

## 2021-05-27 ENCOUNTER — Ambulatory Visit
Admission: RE | Admit: 2021-05-27 | Discharge: 2021-05-27 | Disposition: A | Discharge status: Home / Self Care | Payer: Medicare Other | Source: Ambulatory Visit | Attending: Internal Medicine | Admitting: Internal Medicine

## 2021-05-27 ENCOUNTER — Other Ambulatory Visit: Payer: Self-pay | Admitting: Internal Medicine

## 2021-05-27 DIAGNOSIS — M545 Low back pain, unspecified: Secondary | ICD-10-CM | POA: Diagnosis not present

## 2021-05-27 DIAGNOSIS — M4316 Spondylolisthesis, lumbar region: Secondary | ICD-10-CM | POA: Diagnosis not present

## 2021-05-27 DIAGNOSIS — M2578 Osteophyte, vertebrae: Secondary | ICD-10-CM | POA: Diagnosis not present

## 2021-06-01 ENCOUNTER — Ambulatory Visit: Payer: Medicare Other

## 2021-06-01 DIAGNOSIS — I10 Essential (primary) hypertension: Secondary | ICD-10-CM | POA: Diagnosis not present

## 2021-06-02 ENCOUNTER — Other Ambulatory Visit: Payer: Self-pay | Admitting: Cardiology

## 2021-06-02 DIAGNOSIS — I5042 Chronic combined systolic (congestive) and diastolic (congestive) heart failure: Secondary | ICD-10-CM

## 2021-06-02 DIAGNOSIS — M461 Sacroiliitis, not elsewhere classified: Secondary | ICD-10-CM | POA: Diagnosis not present

## 2021-06-02 DIAGNOSIS — I10 Essential (primary) hypertension: Secondary | ICD-10-CM

## 2021-06-10 ENCOUNTER — Other Ambulatory Visit: Payer: Self-pay

## 2021-06-10 ENCOUNTER — Encounter: Payer: Self-pay | Admitting: Pharmacist

## 2021-06-10 ENCOUNTER — Ambulatory Visit (INDEPENDENT_AMBULATORY_CARE_PROVIDER_SITE_OTHER): Payer: Medicare Other | Admitting: Pharmacist

## 2021-06-10 VITALS — BP 130/70 | HR 54 | Resp 16 | Ht 69.5 in | Wt 177.0 lb

## 2021-06-10 DIAGNOSIS — I10 Essential (primary) hypertension: Secondary | ICD-10-CM

## 2021-06-10 DIAGNOSIS — K592 Neurogenic bowel, not elsewhere classified: Secondary | ICD-10-CM

## 2021-06-10 DIAGNOSIS — I2510 Atherosclerotic heart disease of native coronary artery without angina pectoris: Secondary | ICD-10-CM

## 2021-06-10 DIAGNOSIS — I5042 Chronic combined systolic (congestive) and diastolic (congestive) heart failure: Secondary | ICD-10-CM | POA: Diagnosis not present

## 2021-06-10 DIAGNOSIS — Z9861 Coronary angioplasty status: Secondary | ICD-10-CM

## 2021-06-10 MED ORDER — CARVEDILOL 3.125 MG PO TABS: 3.1250 mg | ORAL_TABLET | Freq: Two times a day (BID) | ORAL | 1 refills | Status: DC

## 2021-06-10 NOTE — Progress Notes (Signed)
Patient ID: [name]                 DOB: 1935-06-18                      MRN: [id number] ? ? ? ? ?HPI: ?[name] is a 86 y.o. male referred by Dr. Ellyn Hack to HTN clinic. PMH is significant for CAD S/P PCI, hypertension, A-fib with a CHAD2DS2-VASc of 6, chronic combined systolic and diastolic HF, OSA, diabetes, neurogenic bowel, and CKD here today to follow-up for medication management. Had an ECHO on 01/19/2021 that showed LVEF of 40-45%. ? ?He was last seen by Dr. Ellyn Hack on 04/07/2021 where his blood pressure was 140/70 and HR 48. He and Dr. Ellyn Hack have been working to decrease his medications. At his last visit he was bradycardic and had to decrease his dose of carvedilol from 6.25 mg BID to 3.125 mg BID. ? ?At last visit, patient voiced fatigue and concern regarding amount of medications he was taking.  Discontinued telmisartan and chlorthalidone and started combination Olmesartan/HCTZ to reduce pill burden. ? ?Patient presents today with wife.  Has not switched to olmesartan/HCTZ yet because they still have telmisartan and chlorthalidone left.  Blood pressure has been better controlled although patient still feels fatigued.  However has taken multiple trips to [facility] in 2023. ? ?Patient's wife reports he is also taking spironolactone.  Was not on patient's med list.  Has listed on intolerance list due to hyperkalemia.  K levels have been normal in past year. ? ?Patient reports he is having frequent diarrhea which he can no longer control. Wife purchased him depends. Has been having accidents on floor.   ? ?Current HTN meds:  ?Telmisartan ?Chlorthalidone ? ?Olmesartan-HCTZ 40-12.'5mg'$  daily (has not started) ?Carvedilol 3.'125mg'$  BID ?Spironolactone 12.'5mg'$  daily ? ?BP goal: <140/90 ? ?Wt Readings from Last 3 Encounters:  ?04/28/21 188 lb (85.3 kg)  ?04/07/21 185 lb (83.9 kg)  ?02/05/21 188 lb (85.3 kg)  ? ?BP Readings from Last 3 Encounters:  ?04/28/21 (!) 153/77  ?04/07/21 140/70  ?02/05/21 (!)  146/68  ? ?Pulse Readings from Last 3 Encounters:  ?04/28/21 (!) 53  ?04/07/21 (!) 48  ?02/05/21 (!) 55  ? ? ?Renal function: ?CrCl cannot be calculated (Patient's most recent lab result is older than the maximum 21 days allowed.). ? ?Past Medical History:  ?Diagnosis Date  ? Arthritis   ? CAD S/P percutaneous coronary angioplasty 08/2004  ? a) CARDIOLOGIST-  DR DAVID HARDING; DES to LCx. X2  - Cypher 2.5 mm postdilated to 2.75 mm (20 mm and 13 mm stents);; Myoview 07/2020 ++ -> Cath with diffuse 90-100% CTO of p-dRCA (L-R collaterals), mid LAD 75 (SYNERGY DES 2.5 X 20 - 3.1>2.8), Mid-Distal LCx 65% & 70% prior to patent stent --> DES PCI (overlaps old) - SYNERGY 2.75 X 38 -> 3.1 mm)  ? Colon polyps 05/10/2005  ? Hyperplastic polyps  ? Complication of anesthesia   ? "takes long time to wake up"  ? Depression   ? Diabetes mellitus type 2 with complications ([place])   ? neuropathy; CAD borderline- no med  ? Diastolic dysfunction without heart failure   ? Moderate LVH, Gr 1 DD on Echo 2011; no CHF admissions, no tt on diuretisc  ? Essential hypertension   ? H/O: gout STABLE  ? History of melanoma excision 2010  ? FOREHEAD  ? History of prostate cancer 2004--  S/P SEED IMPLANTS   ?  NO RECURRENCE  ? Hydrocele, left   ? Hyperlipidemia LDL goal <70   ? Impaired hearing RIGHT HEARING AID  ? OSA on CPAP   ? bipap  ? PAF (paroxysmal atrial fibrillation) ([place]) 12/11/2017  ? Admitted with Afib RVR - converted on IV Diltiazem. d/c on Eliquis; CHA2DS2Vasc = 5.  ? Prostate cancer ([place])   ? prostate , melanoma head  ? Shingles 2007  ? Syncope and collapse 06/2015  ? Likely related to post micturition, Neurontin and dehydration with orthostatic hypotension  ? Urgency of urination   ? ? ?Current Outpatient Medications on File Prior to Visit  ?Medication Sig Dispense Refill  ? apixaban (ELIQUIS) 2.5 MG TABS tablet Take 1 tablet (2.5 mg total) by mouth 2 (two) times daily. 180 tablet 2  ? carvedilol (COREG) 6.25 MG tablet Take 0.5 tablets  (3.125 mg total) by mouth 2 (two) times daily with a meal. 90 tablet 3  ? Cholecalciferol (VITAMIN D3) 50 MCG (2000 UT) CAPS Take 2,000 Units by mouth daily.    ? clopidogrel (PLAVIX) 75 MG tablet TAKE 1 TABLET BY MOUTH EVERY DAY 90 tablet 2  ? diclofenac Sodium (VOLTAREN) 1 % GEL Apply 2 g topically daily as needed (Pain).    ? escitalopram (LEXAPRO) 20 MG tablet Take 20 mg by mouth daily.    ? metFORMIN (GLUCOPHAGE) 500 MG tablet Take 1 tablet (500 mg total) by mouth 2 (two) times daily with a meal.    ? nitroGLYCERIN (NITROSTAT) 0.4 MG SL tablet Place 1 tablet (0.4 mg total) under the tongue every 5 (five) minutes as needed for chest pain. 25 tablet 2  ? olmesartan-hydrochlorothiazide (BENICAR HCT) 40-12.5 MG tablet TAKE 1 TABLET BY MOUTH EVERY DAY 30 tablet 0  ? ONETOUCH ULTRA test strip USE TO TEST BLOOD SUGAR ONCE OR TWICE A DAY (E11.22)    ? oxybutynin (DITROPAN XL) 15 MG 24 hr tablet Take 1 tablet (15 mg total) by mouth at bedtime. 30 tablet 1  ? rosuvastatin (CRESTOR) 20 MG tablet Take 20 mg by mouth every evening.    ? tamsulosin (FLOMAX) 0.4 MG CAPS capsule Take 1 capsule (0.4 mg total) by mouth at bedtime. 30 capsule 1  ? ?No current facility-administered medications on file prior to visit.  ? ? ?Allergies  ?Allergen Reactions  ? Betadine [Povidone Iodine] Anaphylaxis  ? Contrast Media [Iodinated Contrast Media] Anaphylaxis and Rash  ?  Other Reaction: Other reaction  ? Iodine Anaphylaxis  ?  Shellfish, IVP dye. Swelling in throat and foaming at mouth.  ? Shellfish Allergy Anaphylaxis  ? Spironolactone Other (See Comments)  ?   Hyperkalemia   ? Gabapentin Other (See Comments)  ? ? ? ?Assessment/Plan: ? ?1. Hypertension -  BP 130/70 which is at goal of <140/90. Less aggressive goal chosen due to renal function.  No medication changes needed at this time.  Added spironolactone to medication list.  Requests new Rx for carvedilol so she does not have to continue to cut tablets. ? ?Placed referral for GI  since patient is having frequent GI upset. ? ?Has follow up with Dr Ellyn Hack in 4 weeks ? ?Continue: ?Chlorthalidone 12.'5mg'$  daily ?Telmisartan '80mg'$  daily ?Spironolactone 12.'5mg'$  daily ?Carvedilol 3.'125mg'$  twice daily ?Recheck with Dr Ellyn Hack in 4 weeks. Recheck in HTN clinic as needed ? ?Karren Cobble, PharmD, BCACP, [place], CPP ?[address], [address] ?[place], [place], [address] ?Phone: [phone], Fax: [phone]  ?

## 2021-06-10 NOTE — Patient Instructions (Addendum)
It was nice seeing you two again ? ?We would like your blood pressure to stay less than 140/90 ? ?Please continue your: ? ?Chlorthalidone 12.'5mg'$  daily ?Telmisartan '80mg'$  daily ?Spironolactone 12.'5mg'$  daily ?Carvedilol 3.'125mg'$  twice daily ? ?I sent in a new prescription for carvedilol 3.'125mg'$  tablets so you no longer have to cut your current tablets ? ?I placed a referral for GI for you ? ?Please continue to monitor your blood pressure and blood sugar at home ? ?Karren Cobble, PharmD, BCACP, [place], CPP ?[address], [address] ?[place], [place], [address] ?Phone: [phone], Fax: [phone]  ?

## 2021-06-10 NOTE — Telephone Encounter (Signed)
Please complete prior authorization for: ? ?Name of medication, dose, and frequency repatha '140mg'$  sq q 14 days or Praluen t'75mg'$  sq q 14 days ? ?Lab Orders Requested? yes ? ?Which labs? Lipid panel ? ?Estimated date for labs to be scheduled 2-3 months ? ?Does patient need activated copay card? no  ?

## 2021-06-10 NOTE — Telephone Encounter (Signed)
This encounter was created in error - please disregard.

## 2021-06-14 ENCOUNTER — Other Ambulatory Visit: Payer: Self-pay

## 2021-06-14 ENCOUNTER — Ambulatory Visit: Payer: Medicare Other | Attending: Internal Medicine | Admitting: Physical Therapy

## 2021-06-14 ENCOUNTER — Encounter: Payer: Self-pay | Admitting: Physical Therapy

## 2021-06-14 DIAGNOSIS — M6281 Muscle weakness (generalized): Secondary | ICD-10-CM | POA: Insufficient documentation

## 2021-06-14 DIAGNOSIS — R2689 Other abnormalities of gait and mobility: Secondary | ICD-10-CM | POA: Insufficient documentation

## 2021-06-14 DIAGNOSIS — G8929 Other chronic pain: Secondary | ICD-10-CM | POA: Insufficient documentation

## 2021-06-14 DIAGNOSIS — M545 Low back pain, unspecified: Secondary | ICD-10-CM | POA: Diagnosis not present

## 2021-06-14 NOTE — Patient Instructions (Signed)
Access Code: [email or web address] ?URL: [email or web address] ?Date: 06/14/2021 ?Prepared by: Hilda Blades ? ?Exercises ?Hooklying Single Knee to Chest Stretch - 1 x daily - 3 reps - 15 seconds hold ?Supine Piriformis Stretch with Foot on Ground - 1 x daily - 3 reps - 15 seconds hold ?Supine Lower Trunk Rotation - 1 x daily - 10 reps - 5 seconds hold ?Supine Bridge - 1 x daily - 2 sets - 10 reps ?Small Range Straight Leg Raise - 1 x daily - 2 sets - 10 reps ?Sidelying Hip Abduction - 1 x daily - 2 sets - 10 reps ?Seated Hamstring Stretch - 1 x daily - 3 reps - 15 seconds hold ?Sit to Stand Without Arm Support - 1 x daily - 2 sets - 10 reps ? ?

## 2021-06-14 NOTE — Therapy (Signed)
OUTPATIENT PHYSICAL THERAPY EVALUATION   Patient Name: [name] MRN: [id number] DOB:Oct 31, 1935, 86 y.o., male Today's Date: 06/14/2021   PT End of Session - 06/14/21 1425     Visit Number 1    Number of Visits 17    Date for PT Re-Evaluation 08/09/21    Authorization Type MCR    Progress Note Due on Visit 10    PT Start Time 1435    PT Stop Time 1520    PT Time Calculation (min) 45 min    Activity Tolerance Patient tolerated treatment well    Behavior During Therapy [facility] for tasks assessed/performed             Past Medical History:  Diagnosis Date   Arthritis    CAD S/P percutaneous coronary angioplasty 08/2004   a) CARDIOLOGIST-  DR DAVID HARDING; DES to LCx. X2  - Cypher 2.5 mm postdilated to 2.75 mm (20 mm and 13 mm stents);; Myoview 07/2020 ++ -> Cath with diffuse 90-100% CTO of p-dRCA (L-R collaterals), mid LAD 75 (SYNERGY DES 2.5 X 20 - 3.1>2.8), Mid-Distal LCx 65% & 70% prior to patent stent --> DES PCI (overlaps old) - SYNERGY 2.75 X 38 -> 3.1 mm)   Colon polyps 05/10/2005   Hyperplastic polyps   Complication of anesthesia    "takes long time to wake up"   Depression    Diabetes mellitus type 2 with complications ([place])    neuropathy; CAD borderline- no med   Diastolic dysfunction without heart failure    Moderate LVH, Gr 1 DD on Echo 2011; no CHF admissions, no tt on diuretisc   Essential hypertension    H/O: gout STABLE   History of melanoma excision 2010   FOREHEAD   History of prostate cancer 2004--  S/P SEED IMPLANTS    NO RECURRENCE   Hydrocele, left    Hyperlipidemia LDL goal <70    Impaired hearing RIGHT HEARING AID   OSA on CPAP    bipap   PAF (paroxysmal atrial fibrillation) ([place]) 12/11/2017   Admitted with Afib RVR - converted on IV Diltiazem. d/c on Eliquis; CHA2DS2Vasc = 5.   Prostate cancer [facility])    prostate , melanoma head   Shingles 2007   Syncope and collapse 06/2015   Likely related to post micturition, Neurontin and dehydration  with orthostatic hypotension   Urgency of urination    Past Surgical History:  Procedure Laterality Date   Cardiac Event Monitor  3-07/2015   Mostly SR - 58-132 bpm. Rare PVCs (occ bigeminy), Frequent PACs - singlets, couplets - short runs of PAT.   circumsision     CORONARY ANGIOPLASTY WITH STENT PLACEMENT  09/01/2004   Cypher DES x 2 d-m LCx 2.5 mm x 20 mm & 2.5 mm x 13 mm (~2.75 mm)   CORONARY STENT INTERVENTION N/A 08/04/2020   Procedure: CORONARY STENT INTERVENTION;  Surgeon: Leonie Man, MD;  Location: [place] [facility];  Service: Cardiovascular;; STAGED: mid LAD 75% (SYNERGY DES 2.5 X 20 --> 3.1-28 mm post-dilation; Mid LCx 65% - distal 70% prior to old stents => DES PCI (overlapps old stent & crossing OM1) SYNERGY DES 2.75 X 38 -> 3.1 mm)   EXCISION MELANOMA FROM FOREHEAD  2010   EYE SURGERY Bilateral    cataracts   HYDROCELE EXCISION  10/14/2011   Procedure: HYDROCELECTOMY ADULT;  Surgeon: Ailene Rud, MD;  Location: [facility];  Service: Urology;  Laterality: Left;   LAMINECTOMY N/A  07/13/2012   Procedure: THORACIC LAMINECTOMY FOR RESECTION OF INTRAMEDULLARY SPINAL CHORD TUMOR WITH SPINAL CHORD MONITORING;  Surgeon: Erline Levine, MD;  Location: [place] [facility];  Service: Neurosurgery;  Laterality: N/A;  Thoracic laminectomy for resection of intramedullary spinal cord tumor with spinal cord monitering and Dr. Christella Noa to assist   LAMINECTOMY N/A 12/09/2014   Procedure: Redo Thoracic laminectomy with intramedullary tumar resection/USN/[place]/Subarachnoid shunt/spinal cord monitoring/Dr. Kathyrn Sheriff to assist;  Surgeon: Erline Levine, MD;  Location: [place] [facility];  Service: Neurosurgery;  Laterality: N/A;  Redo Thoracic laminectomy with intramedullary tumar resection/USN/[place]/Subarachnoid shunt/spinal cord monitoring/Dr. Kathyrn Sheriff to assist   LEFT HEART CATH AND CORONARY ANGIOGRAPHY N/A 08/03/2020   Procedure: LEFT HEART CATH AND CORONARY ANGIOGRAPHY;  Surgeon:  Leonie Man, MD;  Location: [facility] INVASIVE CV LAB;;Proximal-mid RCA 99% with 90% side branch R VM.  Distal RCA 100%.  Proximal LAD 40%.  Mid LAD 75%.  Proximal LCx 65%.  Proximal-mid LCx 30%.  Mid LCx 70% with widely patent mid to distal LCx stent patent.  Mildly elevated LVEDP. - staged PCI LAD & LCx   NM MYOVIEW LTD  3/212014; June 2016   Both Lexiscan: a. LOW RISK, mild inferior bowel artifact; No ischemia or Infarction; b. Low risk, normal study. No infarction or ischemia. No artifact.   NM MYOVIEW LTD  07/09/2020   Fixed mid to apical anteroseptal perfusion defect with hypokinesia in this area consistent with prior infarct.  EF estimated 50%.  Also fixed inferior perfusion defect and apical defect with normal wall motion consistent with artifact.  Read as low risk, no ischemia, however there is a significant anterior defect not present on previous study.Marland Kitchen   PROSTATE PALLADIUM GOLD SEED IMPLANTS (118)  11/15/2002   PROSTATE CANCER   SKIN BIOPSY     TONSILLECTOMY     TRANSTHORACIC ECHOCARDIOGRAM  12/2017   (A. fib): Moderate concentric LVH.  EF 55 to 60%.  No wall motion normality.  Mild to moderately dilated left atrium.   Patient Active Problem List   Diagnosis Date Noted   Chronic kidney disease, stage 2 (mild) 04/28/2021   Diabetic renal disease ([place]) 04/28/2021   Gout 04/28/2021   Hearing loss 04/28/2021   Abnormal gait 04/28/2021   Major depression in complete remission ([place]) 04/28/2021   Incontinence of feces 04/28/2021   Renal artery stenosis ([facility]) 04/28/2021   Aortic atherosclerosis ([place]) 04/17/2021   Stage 3b chronic kidney disease (CKD) ([place]) 04/07/2021   Chronic combined systolic and diastolic heart failure () 02/27/2021   Nonspecific abnormal electrocardiogram (ECG) (EKG) 12/29/2020   Sacroiliac joint pain 08/12/2020   Pain of left hip joint 08/07/2020   Coronary artery disease involving native coronary artery of native heart with angina pectoris ([place]) 08/03/2020    Abnormal nuclear stress test 07/30/2020   Chronic left shoulder pain 08/08/2018   Anticoagulation adequate, Eliquis with CHA2DS2VASc of 6 12/12/2017   PAF (paroxysmal atrial fibrillation) ; CHA2DS2-VASc Score =6 (Eliquis) 12/11/2017   Syncope, non cardiac 08/12/2015   Finding of multiple premature atrial contractions by electrocardiography 08/12/2015   Diabetes type 2, uncontrolled    Thoracic spine tumor 12/12/2014   Neurogenic bowel 12/12/2014   Incomplete paraplegia ([place]) 12/12/2014   Back pain 12/09/2014   Malignant tumor spinal cord ([place]) 12/09/2014   Chronic fatigue 09/17/2014   Chest pain of uncertain etiology 09/32/3557   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 ([place]) 02/17/2014   Obesity 01/01/2014   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  Hereditary and idiopathic peripheral neuropathy 42/68/3419   Diastolic dysfunction without heart failure    Neoplasm of central nervous system 05/15/2013   Obesity (BMI 30-39.9) 03/22/2013   Thoracic spondylosis with myelopathy 11/19/2012   OSA on CPAP 03/19/2012   H/O prostate cancer 03/19/2012   Essential hypertension 03/19/2012   Hyperlipidemia LDL goal <70 03/19/2012   H/O hydrocele 03/19/2012   Hx of melanoma excision 03/19/2012   Personal history of colonic polyps 03/19/2012   CAD S/P PCI: Cypher DES x2 to LCx (2006); 07/2020: Additional DES stent LCx, with stent in LAD 08/17/2004    PCP: Lavone Orn, MD  REFERRING PROVIDER: Kathalene Frames, *  REFERRING DIAG: Low back pain  THERAPY DIAG:  Chronic bilateral low back pain, unspecified whether sciatica present  Muscle weakness (generalized)  Other abnormalities of gait and mobility  ONSET DATE: "a couple of months"  SUBJECTIVE:          SUBJECTIVE STATEMENT: Patient reports 3 years ago he had a tumor removed from his thoracic spine and that resulted in right leg numbness and weakness so have been walking with a cane since that  time. He also reports he has no energy, and feels he needs to exercise to get better and improve his endurance. He also notes some low back pain for past few months, and he is scheduled for a cortisone shot on March 23rd.   PERTINENT HISTORY:  See above  PAIN:  Are you having pain?  Yes: NPRS scale: 5/10 Pain location: Lower back (right > left) Pain description: Dull ache, tight Aggravating factors: Bending forward Relieving factors: Rest  PRECAUTIONS: None  WEIGHT BEARING RESTRICTIONS No  FALLS:  Has patient fallen in last 6 months? Yes, Number of falls: 1 about 3 weeks ago, fell forward while bending to pick something up  LIVING ENVIRONMENT: Lives with: lives with their spouse Lives in: House/apartment Stairs: No  OCCUPATION: Retired  PLOF: Independent  PATIENT GOALS: Pain relief and improve energy level   OBJECTIVE:  DIAGNOSTIC FINDINGS:  Lumbar X-ray 05/27/2021: 1. Multilevel degenerative disc disease and facet hypertrophy, most prominently at L4-L5. 2. Trace anterolisthesis of L4 on L5.  PATIENT SURVEYS:  FOTO 48% functional status  SCREENING FOR RED FLAGS: Negative  COGNITION: Overall cognitive status: Within functional limits for tasks assessed     SENSATION: Light touch: patient reports sensation deficits to RLE from previous surgery  MUSCLE LENGTH: Patient demonstrates hamstring flexibility deficits  POSTURE:  Rounded shoulder posture, decreased lumbar lordosis  PALPATION: Mildly tender to palpation of right lumbar paraspinals  LUMBAR ROM:   Active  A/PROM  06/14/2021  Flexion 75%  Extension 50%  Right lateral flexion 50%  Left lateral flexion 50%  Right rotation 50%  Left rotation 50%   LE PROM:   Grossly WFL and non-painful  LE MMT:  MMT Right 06/14/2021 Left 06/14/2021  Hip flexion 4- 4  Hip extension 3 3  Hip abduction 3- 3  Knee flexion 4 4+  Knee extension 4 4+   LUMBAR SPECIAL TESTS:  Not assessed  FUNCTIONAL TESTS:   5xSTS: 17 seconds  GAIT: Assistive device utilized: Single point cane Level of assistance: Modified independence Comments: patient demonstrates wide BOS, bilateral toe out, compensated trendelenburg on right, mildly unstable   TODAY'S TREATMENT  Hooklying SKTC stretch 2 x 15 sec each Piriformis stretch 2 x 15 sec each LTR 5 x 5 sec each Bridge x 10 SLR x 10 each Sidelying hip abduction x 10 each Seated  hamstring stretch 2 x 15 sec each Sit to stand without UE support x 10  PATIENT EDUCATION:  Education details: Exam findings, POC, HEP Person educated: Patient Education method: Explanation, Demonstration, Tactile cues, Verbal cues, and Handouts Education comprehension: verbalized understanding, returned demonstration, verbal cues required, tactile cues required, and needs further education  HOME EXERCISE PROGRAM: Access Code: [email or web address]   ASSESSMENT: CLINICAL IMPRESSION: Patient is a 86 y.o. male who was seen today for physical therapy evaluation and treatment for low back pain, gait deviations, and generalized weakness and fatigue with activity.    OBJECTIVE IMPAIRMENTS Abnormal gait, decreased activity tolerance, decreased balance, difficulty walking, decreased ROM, decreased strength, impaired flexibility, postural dysfunction, and pain.   ACTIVITY LIMITATIONS community activity, meal prep, yard work, and shopping.   PERSONAL FACTORS Age, Fitness, Past/current experiences, and Time since onset of injury/illness/exacerbation are also affecting patient's functional outcome.    REHAB POTENTIAL: Good  CLINICAL DECISION MAKING: Stable/uncomplicated  EVALUATION COMPLEXITY: Low   GOALS: Goals reviewed with patient? Yes  SHORT TERM GOALS: Target date: 07/12/2021  Patient will be I with initial HEP in order to progress with therapy. Baseline: HEP provided at eval Goal status: INITIAL  2.  PT will review FOTO with patient by 3rd visit in order to understand expected  progress and outcome with therapy. Baseline: assessed at eval Goal status: INITIAL  3.  Patient will report low back pain </= 2/10 in order to reduce functional limitations Baseline: 5/10  Goal status: INITIAL  LONG TERM GOALS: Target date: 08/09/2021  Patient will be I with final HEP to maintain progress from PT. Baseline: HEP provided at eval Goal status: INITIAL  2.  Patient will report >/= 57% status on FOTO to indicate improved functional ability. Baseline: 48% functional status Goal status: INITIAL  3.  Patient will perform 5xSTS </= 12 seconds to indicate improved strength and reduced fall risk to improve safety Baseline: 17 seconds Goal status: INITIAL  4.  Patient will demonstrate bilateral knee strength 5/5 MMT and bilateral hip strength grossly >/= 4-/5 MMT in order to improve gait and mobility to reduce fatigue and endurance deficits Baseline: knee strength </= 4+/5, hip strength </= 3/5 Goal status: INITIAL   PLAN: PT FREQUENCY: 1-2x/week  PT DURATION: 8 weeks  PLANNED INTERVENTIONS: Therapeutic exercises, Therapeutic activity, Neuromuscular re-education, Balance training, Gait training, Patient/Family education, Joint manipulation, Joint mobilization, Aquatic Therapy, Dry Needling, Electrical stimulation, Spinal manipulation, Spinal mobilization, Cryotherapy, Moist heat, and Manual therapy.  PLAN FOR NEXT SESSION: Review HEP and progress PRN, NuStep for endurance training, LE strengthening, balance and gait training   Hilda Blades, PT, DPT, LAT, ATC 06/14/21  4:36 PM Phone: [phone] Fax: [phone]

## 2021-06-15 ENCOUNTER — Ambulatory Visit: Payer: Medicare Other | Admitting: Physical Therapy

## 2021-06-15 ENCOUNTER — Other Ambulatory Visit: Payer: Self-pay

## 2021-06-15 ENCOUNTER — Encounter: Payer: Self-pay | Admitting: Physical Therapy

## 2021-06-15 DIAGNOSIS — M545 Low back pain, unspecified: Secondary | ICD-10-CM

## 2021-06-15 DIAGNOSIS — R2689 Other abnormalities of gait and mobility: Secondary | ICD-10-CM

## 2021-06-15 DIAGNOSIS — M6281 Muscle weakness (generalized): Secondary | ICD-10-CM | POA: Diagnosis not present

## 2021-06-15 DIAGNOSIS — G8929 Other chronic pain: Secondary | ICD-10-CM | POA: Diagnosis not present

## 2021-06-15 NOTE — Therapy (Signed)
?OUTPATIENT PHYSICAL THERAPY TREATMENT NOTE ? ? ?Patient Name: [name] ?MRN: [id number] ?DOB:01/19/1936, 86 y.o., male ?Today's Date: 06/15/2021 ? ?PCP: Lavone Orn, MD ?REFERRING PROVIDER: Lavone Orn, MD ? ? PT End of Session - 06/15/21 1512   ? ? Visit Number 2   ? Number of Visits 17   ? Date for PT Re-Evaluation 08/09/21   ? Authorization Type MCR   ? Progress Note Due on Visit 10   ? PT Start Time 1528   ? PT Stop Time 1610   ? PT Time Calculation (min) 42 min   ? Activity Tolerance Patient tolerated treatment well   ? Behavior During Therapy [facility] for tasks assessed/performed   ? ?  ?  ? ?  ? ? ?Past Medical History:  ?Diagnosis Date  ? Arthritis   ? CAD S/P percutaneous coronary angioplasty 08/2004  ? a) CARDIOLOGIST-  DR DAVID HARDING; DES to LCx. X2  - Cypher 2.5 mm postdilated to 2.75 mm (20 mm and 13 mm stents);; Myoview 07/2020 ++ -> Cath with diffuse 90-100% CTO of p-dRCA (L-R collaterals), mid LAD 75 (SYNERGY DES 2.5 X 20 - 3.1>2.8), Mid-Distal LCx 65% & 70% prior to patent stent --> DES PCI (overlaps old) - SYNERGY 2.75 X 38 -> 3.1 mm)  ? Colon polyps 05/10/2005  ? Hyperplastic polyps  ? Complication of anesthesia   ? "takes long time to wake up"  ? Depression   ? Diabetes mellitus type 2 with complications ([place])   ? neuropathy; CAD borderline- no med  ? Diastolic dysfunction without heart failure   ? Moderate LVH, Gr 1 DD on Echo 2011; no CHF admissions, no tt on diuretisc  ? Essential hypertension   ? H/O: gout STABLE  ? History of melanoma excision 2010  ? FOREHEAD  ? History of prostate cancer 2004--  S/P SEED IMPLANTS   ? NO RECURRENCE  ? Hydrocele, left   ? Hyperlipidemia LDL goal <70   ? Impaired hearing RIGHT HEARING AID  ? OSA on CPAP   ? bipap  ? PAF (paroxysmal atrial fibrillation) ([place]) 12/11/2017  ? Admitted with Afib RVR - converted on IV Diltiazem. d/c on Eliquis; CHA2DS2Vasc = 5.  ? Prostate cancer ([place])   ? prostate , melanoma head  ? Shingles 2007  ? Syncope and collapse  06/2015  ? Likely related to post micturition, Neurontin and dehydration with orthostatic hypotension  ? Urgency of urination   ? ?Past Surgical History:  ?Procedure Laterality Date  ? Cardiac Event Monitor  3-07/2015  ? Mostly SR - 58-132 bpm. Rare PVCs (occ bigeminy), Frequent PACs - singlets, couplets - short runs of PAT.  ? circumsision    ? CORONARY ANGIOPLASTY WITH STENT PLACEMENT  09/01/2004  ? Cypher DES x 2 d-m LCx 2.5 mm x 20 mm & 2.5 mm x 13 mm (~2.75 mm)  ? CORONARY STENT INTERVENTION N/A 08/04/2020  ? Procedure: CORONARY STENT INTERVENTION;  Surgeon: Leonie Man, MD;  Location: [place] [facility];  Service: Cardiovascular;; STAGED: mid LAD 75% (SYNERGY DES 2.5 X 20 --> 3.1-28 mm post-dilation; Mid LCx 65% - distal 70% prior to old stents => DES PCI (overlapps old stent & crossing OM1) SYNERGY DES 2.75 X 38 -> 3.1 mm)  ? EXCISION MELANOMA FROM FOREHEAD  2010  ? EYE SURGERY Bilateral   ? cataracts  ? HYDROCELE EXCISION  10/14/2011  ? Procedure: HYDROCELECTOMY ADULT;  Surgeon: Ailene Rud, MD;  Location: [place]  [facility];  Service: Urology;  Laterality: Left;  ? LAMINECTOMY N/A 07/13/2012  ? Procedure: THORACIC LAMINECTOMY FOR RESECTION OF INTRAMEDULLARY SPINAL CHORD TUMOR WITH SPINAL CHORD MONITORING;  Surgeon: Erline Levine, MD;  Location: [place] [facility];  Service: Neurosurgery;  Laterality: N/A;  Thoracic laminectomy for resection of intramedullary spinal cord tumor with spinal cord monitering and Dr. Christella Noa to assist  ? LAMINECTOMY N/A 12/09/2014  ? Procedure: Redo Thoracic laminectomy with intramedullary tumar resection/USN/[place]/Subarachnoid shunt/spinal cord monitoring/Dr. Kathyrn Sheriff to assist;  Surgeon: Erline Levine, MD;  Location: [place] [facility];  Service: Neurosurgery;  Laterality: N/A;  Redo Thoracic laminectomy with intramedullary tumar resection/USN/[place]/Subarachnoid shunt/spinal cord monitoring/Dr. Kathyrn Sheriff to assist  ? LEFT HEART CATH AND CORONARY ANGIOGRAPHY N/A 08/03/2020   ? Procedure: LEFT HEART CATH AND CORONARY ANGIOGRAPHY;  Surgeon: Leonie Man, MD;  Location: [facility] INVASIVE CV LAB;;Proximal-mid RCA 99% with 90% side branch R VM.  Distal RCA 100%.  Proximal LAD 40%.  Mid LAD 75%.  Proximal LCx 65%.  Proximal-mid LCx 30%.  Mid LCx 70% with widely patent mid to distal LCx stent patent.  Mildly elevated LVEDP. - staged PCI LAD & LCx  ? NM MYOVIEW LTD  3/212014; June 2016  ? Both Lexiscan: a. LOW RISK, mild inferior bowel artifact; No ischemia or Infarction; b. Low risk, normal study. No infarction or ischemia. No artifact.  ? NM MYOVIEW LTD  07/09/2020  ? Fixed mid to apical anteroseptal perfusion defect with hypokinesia in this area consistent with prior infarct.  EF estimated 50%.  Also fixed inferior perfusion defect and apical defect with normal wall motion consistent with artifact.  Read as low risk, no ischemia, however there is a significant anterior defect not present on previous study..  ? PROSTATE PALLADIUM GOLD SEED IMPLANTS (118)  11/15/2002  ? PROSTATE CANCER  ? SKIN BIOPSY    ? TONSILLECTOMY    ? TRANSTHORACIC ECHOCARDIOGRAM  12/2017  ? (A. fib): Moderate concentric LVH.  EF 55 to 60%.  No wall motion normality.  Mild to moderately dilated left atrium.  ? ?Patient Active Problem List  ? Diagnosis Date Noted  ? Chronic kidney disease, stage 2 (mild) 04/28/2021  ? Diabetic renal disease ([place]) 04/28/2021  ? Gout 04/28/2021  ? Hearing loss 04/28/2021  ? Abnormal gait 04/28/2021  ? Major depression in complete remission ([place]) 04/28/2021  ? Incontinence of feces 04/28/2021  ? Renal artery stenosis ([place]) 04/28/2021  ? Aortic atherosclerosis ([place]) 04/17/2021  ? Stage 3b chronic kidney disease (CKD) ([place]) 04/07/2021  ? Chronic combined systolic and diastolic heart failure ([place]) 02/27/2021  ? Nonspecific abnormal electrocardiogram (ECG) (EKG) 12/29/2020  ? Sacroiliac joint pain 08/12/2020  ? Pain of left hip joint 08/07/2020  ? Coronary artery disease involving native coronary  artery of native heart with angina pectoris ([place]) 08/03/2020  ? Abnormal nuclear stress test 07/30/2020  ? Chronic left shoulder pain 08/08/2018  ? Anticoagulation adequate, Eliquis with CHA2DS2VASc of 6 12/12/2017  ? PAF (paroxysmal atrial fibrillation) ; CHA2DS2-VASc Score =6 (Eliquis) 12/11/2017  ? Syncope, non cardiac 08/12/2015  ? Finding of multiple premature atrial contractions by electrocardiography 08/12/2015  ? Diabetes type 2, uncontrolled   ? Thoracic spine tumor 12/12/2014  ? Neurogenic bowel 12/12/2014  ? Incomplete paraplegia ([place]) 12/12/2014  ? Back pain 12/09/2014  ? Malignant tumor spinal cord ([place]) 12/09/2014  ? Chronic fatigue 09/17/2014  ? Chest pain of uncertain etiology 32/99/2426  ? 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  Obesity 01/01/2014  ? 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 Hereditary and idiopathic peripheral neuropathy 11/01/2013  ? Diastolic dysfunction without heart failure   ? Neoplasm of central nervous system 05/15/2013  ? Obesity (BMI 30-39.9) 03/22/2013  ? Thoracic spondylosis with myelopathy 11/19/2012  ? OSA on CPAP 03/19/2012  ? H/O prostate cancer 03/19/2012  ? Essential hypertension 03/19/2012  ? Hyperlipidemia LDL goal <70 03/19/2012  ? H/O hydrocele 03/19/2012  ? Hx of melanoma excision 03/19/2012  ? Personal history of colonic polyps 03/19/2012  ? CAD S/P PCI: Cypher DES x2 to LCx (2006); 07/2020: Additional DES stent LCx, with stent in LAD 08/17/2004  ? ? ?REFERRING PROVIDER: Kathalene Frames, * ?  ?REFERRING DIAG: Low back pain ? ?THERAPY DIAG:  ?Chronic bilateral low back pain, unspecified whether sciatica present ? ?Muscle weakness (generalized) ? ?Other abnormalities of gait and mobility ? ?PERTINENT HISTORY: See above ? ?PRECAUTIONS: None ? ?SUBJECTIVE: Patient reports he is doing well, about the same as yesterday. He has not been able to do his exercises.  ? ?PAIN:  ?Are you having pain?  ?Yes: NPRS  scale: 5/10 ?Pain location: Lower back (right > left) ?Pain description: Dull ache, tight ?Aggravating factors: Bending forward ?Relieving factors: Rest ? ?PATIENT GOALS: Pain relief and improve energy

## 2021-06-21 ENCOUNTER — Encounter: Payer: Medicare Other | Admitting: Physical Therapy

## 2021-06-21 NOTE — Therapy (Signed)
?OUTPATIENT PHYSICAL THERAPY TREATMENT NOTE ? ? ?Patient Name: [name] ?MRN: [id number] ?DOB:07/29/1935, 86 y.o., male ?Today's Date: 06/22/2021 ? ?PCP: Lavone Orn, MD ?REFERRING PROVIDER: Lavone Orn, MD ? ? PT End of Session - 06/22/21 1055   ? ? Visit Number 3   ? Number of Visits 17   ? Date for PT Re-Evaluation 08/09/21   ? Authorization Type MCR   ? Progress Note Due on Visit 10   ? PT Start Time 1050   ? PT Stop Time 1130   ? PT Time Calculation (min) 40 min   ? Equipment Utilized During Treatment Gait belt   ? Activity Tolerance Patient tolerated treatment well   ? Behavior During Therapy [facility] for tasks assessed/performed   ? ?  ?  ? ?  ? ? ? ?Past Medical History:  ?Diagnosis Date  ? Arthritis   ? CAD S/P percutaneous coronary angioplasty 08/2004  ? a) CARDIOLOGIST-  DR DAVID HARDING; DES to LCx. X2  - Cypher 2.5 mm postdilated to 2.75 mm (20 mm and 13 mm stents);; Myoview 07/2020 ++ -> Cath with diffuse 90-100% CTO of p-dRCA (L-R collaterals), mid LAD 75 (SYNERGY DES 2.5 X 20 - 3.1>2.8), Mid-Distal LCx 65% & 70% prior to patent stent --> DES PCI (overlaps old) - SYNERGY 2.75 X 38 -> 3.1 mm)  ? Colon polyps 05/10/2005  ? Hyperplastic polyps  ? Complication of anesthesia   ? "takes long time to wake up"  ? Depression   ? Diabetes mellitus type 2 with complications ([place])   ? neuropathy; CAD borderline- no med  ? Diastolic dysfunction without heart failure   ? Moderate LVH, Gr 1 DD on Echo 2011; no CHF admissions, no tt on diuretisc  ? Essential hypertension   ? H/O: gout STABLE  ? History of melanoma excision 2010  ? FOREHEAD  ? History of prostate cancer 2004--  S/P SEED IMPLANTS   ? NO RECURRENCE  ? Hydrocele, left   ? Hyperlipidemia LDL goal <70   ? Impaired hearing RIGHT HEARING AID  ? OSA on CPAP   ? bipap  ? PAF (paroxysmal atrial fibrillation) ([place]) 12/11/2017  ? Admitted with Afib RVR - converted on IV Diltiazem. d/c on Eliquis; CHA2DS2Vasc = 5.  ? Prostate cancer ([place])   ? prostate ,  melanoma head  ? Shingles 2007  ? Syncope and collapse 06/2015  ? Likely related to post micturition, Neurontin and dehydration with orthostatic hypotension  ? Urgency of urination   ? ?Past Surgical History:  ?Procedure Laterality Date  ? Cardiac Event Monitor  3-07/2015  ? Mostly SR - 58-132 bpm. Rare PVCs (occ bigeminy), Frequent PACs - singlets, couplets - short runs of PAT.  ? circumsision    ? CORONARY ANGIOPLASTY WITH STENT PLACEMENT  09/01/2004  ? Cypher DES x 2 d-m LCx 2.5 mm x 20 mm & 2.5 mm x 13 mm (~2.75 mm)  ? CORONARY STENT INTERVENTION N/A 08/04/2020  ? Procedure: CORONARY STENT INTERVENTION;  Surgeon: Leonie Man, MD;  Location: [place] [facility];  Service: Cardiovascular;; STAGED: mid LAD 75% (SYNERGY DES 2.5 X 20 --> 3.1-28 mm post-dilation; Mid LCx 65% - distal 70% prior to old stents => DES PCI (overlapps old stent & crossing OM1) SYNERGY DES 2.75 X 38 -> 3.1 mm)  ? EXCISION MELANOMA FROM FOREHEAD  2010  ? EYE SURGERY Bilateral   ? cataracts  ? HYDROCELE EXCISION  10/14/2011  ? Procedure: HYDROCELECTOMY ADULT;  Surgeon: Ailene Rud, MD;  Location: [facility];  Service: Urology;  Laterality: Left;  ? LAMINECTOMY N/A 07/13/2012  ? Procedure: THORACIC LAMINECTOMY FOR RESECTION OF INTRAMEDULLARY SPINAL CHORD TUMOR WITH SPINAL CHORD MONITORING;  Surgeon: Erline Levine, MD;  Location: [place] [facility];  Service: Neurosurgery;  Laterality: N/A;  Thoracic laminectomy for resection of intramedullary spinal cord tumor with spinal cord monitering and Dr. Christella Noa to assist  ? LAMINECTOMY N/A 12/09/2014  ? Procedure: Redo Thoracic laminectomy with intramedullary tumar resection/USN/[place]/Subarachnoid shunt/spinal cord monitoring/Dr. Kathyrn Sheriff to assist;  Surgeon: Erline Levine, MD;  Location: [place] [facility];  Service: Neurosurgery;  Laterality: N/A;  Redo Thoracic laminectomy with intramedullary tumar resection/USN/[place]/Subarachnoid shunt/spinal cord monitoring/Dr. Kathyrn Sheriff to assist  ?  LEFT HEART CATH AND CORONARY ANGIOGRAPHY N/A 08/03/2020  ? Procedure: LEFT HEART CATH AND CORONARY ANGIOGRAPHY;  Surgeon: Leonie Man, MD;  Location: [facility] INVASIVE CV LAB;;Proximal-mid RCA 99% with 90% side branch R VM.  Distal RCA 100%.  Proximal LAD 40%.  Mid LAD 75%.  Proximal LCx 65%.  Proximal-mid LCx 30%.  Mid LCx 70% with widely patent mid to distal LCx stent patent.  Mildly elevated LVEDP. - staged PCI LAD & LCx  ? NM MYOVIEW LTD  3/212014; June 2016  ? Both Lexiscan: a. LOW RISK, mild inferior bowel artifact; No ischemia or Infarction; b. Low risk, normal study. No infarction or ischemia. No artifact.  ? NM MYOVIEW LTD  07/09/2020  ? Fixed mid to apical anteroseptal perfusion defect with hypokinesia in this area consistent with prior infarct.  EF estimated 50%.  Also fixed inferior perfusion defect and apical defect with normal wall motion consistent with artifact.  Read as low risk, no ischemia, however there is a significant anterior defect not present on previous study..  ? PROSTATE PALLADIUM GOLD SEED IMPLANTS (118)  11/15/2002  ? PROSTATE CANCER  ? SKIN BIOPSY    ? TONSILLECTOMY    ? TRANSTHORACIC ECHOCARDIOGRAM  12/2017  ? (A. fib): Moderate concentric LVH.  EF 55 to 60%.  No wall motion normality.  Mild to moderately dilated left atrium.  ? ?Patient Active Problem List  ? Diagnosis Date Noted  ? Chronic kidney disease, stage 2 (mild) 04/28/2021  ? Diabetic renal disease ([place]) 04/28/2021  ? Gout 04/28/2021  ? Hearing loss 04/28/2021  ? Abnormal gait 04/28/2021  ? Major depression in complete remission ([place]) 04/28/2021  ? Incontinence of feces 04/28/2021  ? Renal artery stenosis ([place]) 04/28/2021  ? Aortic atherosclerosis ([place]) 04/17/2021  ? Stage 3b chronic kidney disease (CKD) ([place]) 04/07/2021  ? Chronic combined systolic and diastolic heart failure ([place]) 02/27/2021  ? Nonspecific abnormal electrocardiogram (ECG) (EKG) 12/29/2020  ? Sacroiliac joint pain 08/12/2020  ? Pain of left hip joint  08/07/2020  ? Coronary artery disease involving native coronary artery of native heart with angina pectoris ([place]) 08/03/2020  ? Abnormal nuclear stress test 07/30/2020  ? Chronic left shoulder pain 08/08/2018  ? Anticoagulation adequate, Eliquis with CHA2DS2VASc of 6 12/12/2017  ? PAF (paroxysmal atrial fibrillation) ; CHA2DS2-VASc Score =6 (Eliquis) 12/11/2017  ? Syncope, non cardiac 08/12/2015  ? Finding of multiple premature atrial contractions by electrocardiography 08/12/2015  ? Diabetes type 2, uncontrolled   ? Thoracic spine tumor 12/12/2014  ? Neurogenic bowel 12/12/2014  ? Incomplete paraplegia ([place]) 12/12/2014  ? Back pain 12/09/2014  ? Malignant tumor spinal cord ([place]) 12/09/2014  ? Chronic fatigue 09/17/2014  ? Chest pain of uncertain etiology 35/68/6168  ? 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 Obesity 01/01/2014  ? 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 Hereditary and idiopathic peripheral neuropathy 11/01/2013  ? Diastolic dysfunction without heart failure   ? Neoplasm of central nervous system 05/15/2013  ? Obesity (BMI 30-39.9) 03/22/2013  ? Thoracic spondylosis with myelopathy 11/19/2012  ? OSA on CPAP 03/19/2012  ? H/O prostate cancer 03/19/2012  ? Essential hypertension 03/19/2012  ? Hyperlipidemia LDL goal <70 03/19/2012  ? H/O hydrocele 03/19/2012  ? Hx of melanoma excision 03/19/2012  ? Personal history of colonic polyps 03/19/2012  ? CAD S/P PCI: Cypher DES x2 to LCx (2006); 07/2020: Additional DES stent LCx, with stent in LAD 08/17/2004  ? ? ?REFERRING PROVIDER: Kathalene Frames, * ?  ?REFERRING DIAG: Low back pain ? ?THERAPY DIAG:  ?Chronic bilateral low back pain, unspecified whether sciatica present ? ?Muscle weakness (generalized) ? ?Other abnormalities of gait and mobility ? ?PERTINENT HISTORY: See above ? ?PRECAUTIONS: None ? ?SUBJECTIVE: Patient reports he hasn't done his exercises since last visit, he was exhausted  following last visit.  ? ?PAIN:  ?Are you having pain?  ?Yes: NPRS scale: 5/10 ?Pain location: Lower back (right > left) ?Pain description: Dull ache, tight ?Aggravating factors: Bending forward ?Relieving factors: Rest

## 2021-06-22 ENCOUNTER — Ambulatory Visit: Payer: Medicare Other | Admitting: Physical Therapy

## 2021-06-22 ENCOUNTER — Other Ambulatory Visit: Payer: Self-pay

## 2021-06-22 ENCOUNTER — Encounter: Payer: Self-pay | Admitting: Physical Therapy

## 2021-06-22 DIAGNOSIS — M6281 Muscle weakness (generalized): Secondary | ICD-10-CM | POA: Diagnosis not present

## 2021-06-22 DIAGNOSIS — M545 Low back pain, unspecified: Secondary | ICD-10-CM

## 2021-06-22 DIAGNOSIS — G8929 Other chronic pain: Secondary | ICD-10-CM | POA: Diagnosis not present

## 2021-06-22 DIAGNOSIS — R2689 Other abnormalities of gait and mobility: Secondary | ICD-10-CM

## 2021-06-22 NOTE — Therapy (Signed)
?OUTPATIENT PHYSICAL THERAPY TREATMENT NOTE ? ? ?Patient Name: [name] ?MRN: [id number] ?DOB:1935/12/14, 86 y.o., male ?Today's Date: 06/24/2021 ? ?PCP: Lavone Orn, MD ?REFERRING PROVIDER: Lavone Orn, MD ? ? PT End of Session - 06/24/21 1030   ? ? Visit Number 4   ? Number of Visits 17   ? Date for PT Re-Evaluation 08/09/21   ? Authorization Type MCR   ? Progress Note Due on Visit 10   ? PT Start Time 1035   ? PT Stop Time 1115   ? PT Time Calculation (min) 40 min   ? Equipment Utilized During Treatment Gait belt   ? Activity Tolerance Patient tolerated treatment well   ? Behavior During Therapy [facility] for tasks assessed/performed   ? ?  ?  ? ?  ? ? ? ? ?Past Medical History:  ?Diagnosis Date  ? Arthritis   ? CAD S/P percutaneous coronary angioplasty 08/2004  ? a) CARDIOLOGIST-  DR DAVID HARDING; DES to LCx. X2  - Cypher 2.5 mm postdilated to 2.75 mm (20 mm and 13 mm stents);; Myoview 07/2020 ++ -> Cath with diffuse 90-100% CTO of p-dRCA (L-R collaterals), mid LAD 75 (SYNERGY DES 2.5 X 20 - 3.1>2.8), Mid-Distal LCx 65% & 70% prior to patent stent --> DES PCI (overlaps old) - SYNERGY 2.75 X 38 -> 3.1 mm)  ? Colon polyps 05/10/2005  ? Hyperplastic polyps  ? Complication of anesthesia   ? "takes long time to wake up"  ? Depression   ? Diabetes mellitus type 2 with complications ([place])   ? neuropathy; CAD borderline- no med  ? Diastolic dysfunction without heart failure   ? Moderate LVH, Gr 1 DD on Echo 2011; no CHF admissions, no tt on diuretisc  ? Essential hypertension   ? H/O: gout STABLE  ? History of melanoma excision 2010  ? FOREHEAD  ? History of prostate cancer 2004--  S/P SEED IMPLANTS   ? NO RECURRENCE  ? Hydrocele, left   ? Hyperlipidemia LDL goal <70   ? Impaired hearing RIGHT HEARING AID  ? OSA on CPAP   ? bipap  ? PAF (paroxysmal atrial fibrillation) ([place]) 12/11/2017  ? Admitted with Afib RVR - converted on IV Diltiazem. d/c on Eliquis; CHA2DS2Vasc = 5.  ? Prostate cancer ([place])   ? prostate ,  melanoma head  ? Shingles 2007  ? Syncope and collapse 06/2015  ? Likely related to post micturition, Neurontin and dehydration with orthostatic hypotension  ? Urgency of urination   ? ?Past Surgical History:  ?Procedure Laterality Date  ? Cardiac Event Monitor  3-07/2015  ? Mostly SR - 58-132 bpm. Rare PVCs (occ bigeminy), Frequent PACs - singlets, couplets - short runs of PAT.  ? circumsision    ? CORONARY ANGIOPLASTY WITH STENT PLACEMENT  09/01/2004  ? Cypher DES x 2 d-m LCx 2.5 mm x 20 mm & 2.5 mm x 13 mm (~2.75 mm)  ? CORONARY STENT INTERVENTION N/A 08/04/2020  ? Procedure: CORONARY STENT INTERVENTION;  Surgeon: Leonie Man, MD;  Location: [place] [facility];  Service: Cardiovascular;; STAGED: mid LAD 75% (SYNERGY DES 2.5 X 20 --> 3.1-28 mm post-dilation; Mid LCx 65% - distal 70% prior to old stents => DES PCI (overlapps old stent & crossing OM1) SYNERGY DES 2.75 X 38 -> 3.1 mm)  ? EXCISION MELANOMA FROM FOREHEAD  2010  ? EYE SURGERY Bilateral   ? cataracts  ? HYDROCELE EXCISION  10/14/2011  ? Procedure: HYDROCELECTOMY ADULT;  Surgeon: Ailene Rud, MD;  Location: [facility];  Service: Urology;  Laterality: Left;  ? LAMINECTOMY N/A 07/13/2012  ? Procedure: THORACIC LAMINECTOMY FOR RESECTION OF INTRAMEDULLARY SPINAL CHORD TUMOR WITH SPINAL CHORD MONITORING;  Surgeon: Erline Levine, MD;  Location: [place] [facility];  Service: Neurosurgery;  Laterality: N/A;  Thoracic laminectomy for resection of intramedullary spinal cord tumor with spinal cord monitering and Dr. Christella Noa to assist  ? LAMINECTOMY N/A 12/09/2014  ? Procedure: Redo Thoracic laminectomy with intramedullary tumar resection/USN/[place]/Subarachnoid shunt/spinal cord monitoring/Dr. Kathyrn Sheriff to assist;  Surgeon: Erline Levine, MD;  Location: [place] [facility];  Service: Neurosurgery;  Laterality: N/A;  Redo Thoracic laminectomy with intramedullary tumar resection/USN/[place]/Subarachnoid shunt/spinal cord monitoring/Dr. Kathyrn Sheriff to assist  ?  LEFT HEART CATH AND CORONARY ANGIOGRAPHY N/A 08/03/2020  ? Procedure: LEFT HEART CATH AND CORONARY ANGIOGRAPHY;  Surgeon: Leonie Man, MD;  Location: [facility] INVASIVE CV LAB;;Proximal-mid RCA 99% with 90% side branch R VM.  Distal RCA 100%.  Proximal LAD 40%.  Mid LAD 75%.  Proximal LCx 65%.  Proximal-mid LCx 30%.  Mid LCx 70% with widely patent mid to distal LCx stent patent.  Mildly elevated LVEDP. - staged PCI LAD & LCx  ? NM MYOVIEW LTD  3/212014; June 2016  ? Both Lexiscan: a. LOW RISK, mild inferior bowel artifact; No ischemia or Infarction; b. Low risk, normal study. No infarction or ischemia. No artifact.  ? NM MYOVIEW LTD  07/09/2020  ? Fixed mid to apical anteroseptal perfusion defect with hypokinesia in this area consistent with prior infarct.  EF estimated 50%.  Also fixed inferior perfusion defect and apical defect with normal wall motion consistent with artifact.  Read as low risk, no ischemia, however there is a significant anterior defect not present on previous study..  ? PROSTATE PALLADIUM GOLD SEED IMPLANTS (118)  11/15/2002  ? PROSTATE CANCER  ? SKIN BIOPSY    ? TONSILLECTOMY    ? TRANSTHORACIC ECHOCARDIOGRAM  12/2017  ? (A. fib): Moderate concentric LVH.  EF 55 to 60%.  No wall motion normality.  Mild to moderately dilated left atrium.  ? ?Patient Active Problem List  ? Diagnosis Date Noted  ? Chronic kidney disease, stage 2 (mild) 04/28/2021  ? Diabetic renal disease ([place]) 04/28/2021  ? Gout 04/28/2021  ? Hearing loss 04/28/2021  ? Abnormal gait 04/28/2021  ? Major depression in complete remission ([place]) 04/28/2021  ? Incontinence of feces 04/28/2021  ? Renal artery stenosis ([place]) 04/28/2021  ? Aortic atherosclerosis ([place]) 04/17/2021  ? Stage 3b chronic kidney disease (CKD) ([place]) 04/07/2021  ? Chronic combined systolic and diastolic heart failure ([place]) 02/27/2021  ? Nonspecific abnormal electrocardiogram (ECG) (EKG) 12/29/2020  ? Sacroiliac joint pain 08/12/2020  ? Pain of left hip joint  08/07/2020  ? Coronary artery disease involving native coronary artery of native heart with angina pectoris ([place]) 08/03/2020  ? Abnormal nuclear stress test 07/30/2020  ? Chronic left shoulder pain 08/08/2018  ? Anticoagulation adequate, Eliquis with CHA2DS2VASc of 6 12/12/2017  ? PAF (paroxysmal atrial fibrillation) ; CHA2DS2-VASc Score =6 (Eliquis) 12/11/2017  ? Syncope, non cardiac 08/12/2015  ? Finding of multiple premature atrial contractions by electrocardiography 08/12/2015  ? Diabetes type 2, uncontrolled   ? Thoracic spine tumor 12/12/2014  ? Neurogenic bowel 12/12/2014  ? Incomplete paraplegia ([place]) 12/12/2014  ? Back pain 12/09/2014  ? Malignant tumor spinal cord ([place]) 12/09/2014  ? Chronic fatigue 09/17/2014  ? Chest pain of uncertain etiology 16/01/9603  ? 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 Obesity 01/01/2014  ? 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 Hereditary and idiopathic peripheral neuropathy 11/01/2013  ? Diastolic dysfunction without heart failure   ? Neoplasm of central nervous system 05/15/2013  ? Obesity (BMI 30-39.9) 03/22/2013  ? Thoracic spondylosis with myelopathy 11/19/2012  ? OSA on CPAP 03/19/2012  ? H/O prostate cancer 03/19/2012  ? Essential hypertension 03/19/2012  ? Hyperlipidemia LDL goal <70 03/19/2012  ? H/O hydrocele 03/19/2012  ? Hx of melanoma excision 03/19/2012  ? Personal history of colonic polyps 03/19/2012  ? CAD S/P PCI: Cypher DES x2 to LCx (2006); 07/2020: Additional DES stent LCx, with stent in LAD 08/17/2004  ? ? ?REFERRING PROVIDER: Kathalene Frames, * ?  ?REFERRING DIAG: Low back pain ? ?THERAPY DIAG:  ?Chronic bilateral low back pain, unspecified whether sciatica present ? ?Muscle weakness (generalized) ? ?Other abnormalities of gait and mobility ? ?PERTINENT HISTORY: See above ? ?PRECAUTIONS: None ? ?SUBJECTIVE: Patient reports he tried to do some of the exercises. He states he still has the pain  in the right low back. ? ?PAIN:  ?Are you having pain?  ?Yes: NPRS scale: 5/10 ?Pain location: Lower back (right > left) ?Pain description: Dull ache, tight ?Aggravating factors: Bending forward ?Relieving factor

## 2021-06-23 ENCOUNTER — Encounter: Payer: Medicare Other | Admitting: Physical Therapy

## 2021-06-24 ENCOUNTER — Ambulatory Visit: Payer: Medicare Other | Admitting: Physical Therapy

## 2021-06-24 ENCOUNTER — Encounter: Payer: Self-pay | Admitting: Physical Therapy

## 2021-06-24 ENCOUNTER — Other Ambulatory Visit: Payer: Self-pay

## 2021-06-24 DIAGNOSIS — M461 Sacroiliitis, not elsewhere classified: Secondary | ICD-10-CM | POA: Diagnosis not present

## 2021-06-24 DIAGNOSIS — G8929 Other chronic pain: Secondary | ICD-10-CM

## 2021-06-24 DIAGNOSIS — M6281 Muscle weakness (generalized): Secondary | ICD-10-CM | POA: Diagnosis not present

## 2021-06-24 DIAGNOSIS — R2689 Other abnormalities of gait and mobility: Secondary | ICD-10-CM

## 2021-06-24 DIAGNOSIS — M545 Low back pain, unspecified: Secondary | ICD-10-CM | POA: Diagnosis not present

## 2021-06-28 ENCOUNTER — Encounter: Payer: Self-pay | Admitting: Gastroenterology

## 2021-06-28 ENCOUNTER — Encounter: Payer: Medicare Other | Admitting: Physical Therapy

## 2021-06-29 DIAGNOSIS — G3184 Mild cognitive impairment, so stated: Secondary | ICD-10-CM | POA: Diagnosis not present

## 2021-06-29 DIAGNOSIS — G4733 Obstructive sleep apnea (adult) (pediatric): Secondary | ICD-10-CM | POA: Diagnosis not present

## 2021-06-29 DIAGNOSIS — L989 Disorder of the skin and subcutaneous tissue, unspecified: Secondary | ICD-10-CM | POA: Diagnosis not present

## 2021-06-29 DIAGNOSIS — R159 Full incontinence of feces: Secondary | ICD-10-CM | POA: Diagnosis not present

## 2021-06-29 DIAGNOSIS — I5022 Chronic systolic (congestive) heart failure: Secondary | ICD-10-CM | POA: Diagnosis not present

## 2021-06-29 DIAGNOSIS — N1832 Chronic kidney disease, stage 3b: Secondary | ICD-10-CM | POA: Diagnosis not present

## 2021-06-29 DIAGNOSIS — E1122 Type 2 diabetes mellitus with diabetic chronic kidney disease: Secondary | ICD-10-CM | POA: Diagnosis not present

## 2021-06-29 DIAGNOSIS — E782 Mixed hyperlipidemia: Secondary | ICD-10-CM | POA: Diagnosis not present

## 2021-06-29 DIAGNOSIS — Z0000 Encounter for general adult medical examination without abnormal findings: Secondary | ICD-10-CM | POA: Diagnosis not present

## 2021-06-29 DIAGNOSIS — Z1389 Encounter for screening for other disorder: Secondary | ICD-10-CM | POA: Diagnosis not present

## 2021-06-29 DIAGNOSIS — I10 Essential (primary) hypertension: Secondary | ICD-10-CM | POA: Diagnosis not present

## 2021-06-29 DIAGNOSIS — Z8546 Personal history of malignant neoplasm of prostate: Secondary | ICD-10-CM | POA: Diagnosis not present

## 2021-06-29 DIAGNOSIS — I2510 Atherosclerotic heart disease of native coronary artery without angina pectoris: Secondary | ICD-10-CM | POA: Diagnosis not present

## 2021-06-29 DIAGNOSIS — I480 Paroxysmal atrial fibrillation: Secondary | ICD-10-CM | POA: Diagnosis not present

## 2021-07-02 DIAGNOSIS — I10 Essential (primary) hypertension: Secondary | ICD-10-CM | POA: Diagnosis not present

## 2021-07-05 ENCOUNTER — Encounter: Payer: Medicare Other | Admitting: Physical Therapy

## 2021-07-05 NOTE — Therapy (Addendum)
OUTPATIENT PHYSICAL THERAPY TREATMENT NOTE  DISCHARGE   Patient Name: [name] MRN: [id number] DOB:03-21-1936, 86 y.o., male Today's Date: 07/06/2021  PCP: Lavone Orn, MD REFERRING PROVIDER: Lavone Orn, MD   PT End of Session - 07/06/21 1215     Visit Number 5    Number of Visits 17    Date for PT Re-Evaluation 08/09/21    Authorization Type MCR    Progress Note Due on Visit 10    PT Start Time 1215    PT Stop Time 1255    PT Time Calculation (min) 40 min    Equipment Utilized During Treatment Gait belt    Activity Tolerance Patient tolerated treatment well    Behavior During Therapy [facility] for tasks assessed/performed                Past Medical History:  Diagnosis Date   Arthritis    CAD S/P percutaneous coronary angioplasty 08/2004   a) CARDIOLOGIST-  DR DAVID HARDING; DES to LCx. X2  - Cypher 2.5 mm postdilated to 2.75 mm (20 mm and 13 mm stents);; Myoview 07/2020 ++ -> Cath with diffuse 90-100% CTO of p-dRCA (L-R collaterals), mid LAD 75 (SYNERGY DES 2.5 X 20 - 3.1>2.8), Mid-Distal LCx 65% & 70% prior to patent stent --> DES PCI (overlaps old) - SYNERGY 2.75 X 38 -> 3.1 mm)   Colon polyps 05/10/2005   Hyperplastic polyps   Complication of anesthesia    "takes long time to wake up"   Depression    Diabetes mellitus type 2 with complications ([place])    neuropathy; CAD borderline- no med   Diastolic dysfunction without heart failure    Moderate LVH, Gr 1 DD on Echo 2011; no CHF admissions, no tt on diuretisc   Essential hypertension    H/O: gout STABLE   History of melanoma excision 2010   FOREHEAD   History of prostate cancer 2004--  S/P SEED IMPLANTS    NO RECURRENCE   Hydrocele, left    Hyperlipidemia LDL goal <70    Impaired hearing RIGHT HEARING AID   OSA on CPAP    bipap   PAF (paroxysmal atrial fibrillation) ([place]) 12/11/2017   Admitted with Afib RVR - converted on IV Diltiazem. d/c on Eliquis; CHA2DS2Vasc = 5.   Prostate cancer [facility])     prostate , melanoma head   Shingles 2007   Syncope and collapse 06/2015   Likely related to post micturition, Neurontin and dehydration with orthostatic hypotension   Urgency of urination    Past Surgical History:  Procedure Laterality Date   Cardiac Event Monitor  3-07/2015   Mostly SR - 58-132 bpm. Rare PVCs (occ bigeminy), Frequent PACs - singlets, couplets - short runs of PAT.   circumsision     CORONARY ANGIOPLASTY WITH STENT PLACEMENT  09/01/2004   Cypher DES x 2 d-m LCx 2.5 mm x 20 mm & 2.5 mm x 13 mm (~2.75 mm)   CORONARY STENT INTERVENTION N/A 08/04/2020   Procedure: CORONARY STENT INTERVENTION;  Surgeon: Leonie Man, MD;  Location: [facility];  Service: Cardiovascular;; STAGED: mid LAD 75% (SYNERGY DES 2.5 X 20 --> 3.1-28 mm post-dilation; Mid LCx 65% - distal 70% prior to old stents => DES PCI (overlapps old stent & crossing OM1) SYNERGY DES 2.75 X 38 -> 3.1 mm)   EXCISION MELANOMA FROM FOREHEAD  2010   EYE SURGERY Bilateral    cataracts   HYDROCELE EXCISION  10/14/2011  Procedure: HYDROCELECTOMY ADULT;  Surgeon: Ailene Rud, MD;  Location: [facility];  Service: Urology;  Laterality: Left;   LAMINECTOMY N/A 07/13/2012   Procedure: THORACIC LAMINECTOMY FOR RESECTION OF INTRAMEDULLARY SPINAL CHORD TUMOR WITH SPINAL CHORD MONITORING;  Surgeon: Erline Levine, MD;  Location: [place] [facility];  Service: Neurosurgery;  Laterality: N/A;  Thoracic laminectomy for resection of intramedullary spinal cord tumor with spinal cord monitering and Dr. Christella Noa to assist   LAMINECTOMY N/A 12/09/2014   Procedure: Redo Thoracic laminectomy with intramedullary tumar resection/USN/[place]/Subarachnoid shunt/spinal cord monitoring/Dr. Kathyrn Sheriff to assist;  Surgeon: Erline Levine, MD;  Location: [place] [facility];  Service: Neurosurgery;  Laterality: N/A;  Redo Thoracic laminectomy with intramedullary tumar resection/USN/[place]/Subarachnoid shunt/spinal cord monitoring/Dr. Kathyrn Sheriff to  assist   LEFT HEART CATH AND CORONARY ANGIOGRAPHY N/A 08/03/2020   Procedure: LEFT HEART CATH AND CORONARY ANGIOGRAPHY;  Surgeon: Leonie Man, MD;  Location: [facility] INVASIVE CV LAB;;Proximal-mid RCA 99% with 90% side branch R VM.  Distal RCA 100%.  Proximal LAD 40%.  Mid LAD 75%.  Proximal LCx 65%.  Proximal-mid LCx 30%.  Mid LCx 70% with widely patent mid to distal LCx stent patent.  Mildly elevated LVEDP. - staged PCI LAD & LCx   NM MYOVIEW LTD  3/212014; June 2016   Both Lexiscan: a. LOW RISK, mild inferior bowel artifact; No ischemia or Infarction; b. Low risk, normal study. No infarction or ischemia. No artifact.   NM MYOVIEW LTD  07/09/2020   Fixed mid to apical anteroseptal perfusion defect with hypokinesia in this area consistent with prior infarct.  EF estimated 50%.  Also fixed inferior perfusion defect and apical defect with normal wall motion consistent with artifact.  Read as low risk, no ischemia, however there is a significant anterior defect not present on previous study.Marland Kitchen   PROSTATE PALLADIUM GOLD SEED IMPLANTS (118)  11/15/2002   PROSTATE CANCER   SKIN BIOPSY     TONSILLECTOMY     TRANSTHORACIC ECHOCARDIOGRAM  12/2017   (A. fib): Moderate concentric LVH.  EF 55 to 60%.  No wall motion normality.  Mild to moderately dilated left atrium.   Patient Active Problem List   Diagnosis Date Noted   Chronic kidney disease, stage 2 (mild) 04/28/2021   Diabetic renal disease ([place]) 04/28/2021   Gout 04/28/2021   Hearing loss 04/28/2021   Abnormal gait 04/28/2021   Major depression in complete remission ([place]) 04/28/2021   Incontinence of feces 04/28/2021   Renal artery stenosis (HCC) 04/28/2021   Aortic atherosclerosis ([place]) 04/17/2021   Stage 3b chronic kidney disease (CKD) ([place]) 04/07/2021   Chronic combined systolic and diastolic heart failure ([place]) 02/27/2021   Nonspecific abnormal electrocardiogram (ECG) (EKG) 12/29/2020   Sacroiliac joint pain 08/12/2020   Pain of left hip  joint 08/07/2020   Coronary artery disease involving native coronary artery of native heart with angina pectoris ([place]) 08/03/2020   Abnormal nuclear stress test 07/30/2020   Chronic left shoulder pain 08/08/2018   Anticoagulation adequate, Eliquis with CHA2DS2VASc of 6 12/12/2017   PAF (paroxysmal atrial fibrillation) ; CHA2DS2-VASc Score =6 (Eliquis) 12/11/2017   Syncope, non cardiac 08/12/2015   Finding of multiple premature atrial contractions by electrocardiography 08/12/2015   Diabetes type 2, uncontrolled    Thoracic spine tumor 12/12/2014   Neurogenic bowel 12/12/2014   Incomplete paraplegia ([place]) 12/12/2014   Back pain 12/09/2014   Malignant tumor spinal cord ([place]) 12/09/2014   Chronic fatigue 09/17/2014   Chest pain of uncertain etiology 00/93/8182  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 ([place]) 02/17/2014   Obesity 01/01/2014   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   Hereditary and idiopathic peripheral neuropathy 18/56/3149   Diastolic dysfunction without heart failure    Neoplasm of central nervous system 05/15/2013   Obesity (BMI 30-39.9) 03/22/2013   Thoracic spondylosis with myelopathy 11/19/2012   OSA on CPAP 03/19/2012   H/O prostate cancer 03/19/2012   Essential hypertension 03/19/2012   Hyperlipidemia LDL goal <70 03/19/2012   H/O hydrocele 03/19/2012   Hx of melanoma excision 03/19/2012   Personal history of colonic polyps 03/19/2012   CAD S/P PCI: Cypher DES x2 to LCx (2006); 07/2020: Additional DES stent LCx, with stent in LAD 08/17/2004    REFERRING PROVIDER: Kathalene Frames, *   REFERRING DIAG: Low back pain  THERAPY DIAG:  Chronic bilateral low back pain, unspecified whether sciatica present  Muscle weakness (generalized)  Other abnormalities of gait and mobility  PERTINENT HISTORY: See above  PRECAUTIONS: None  SUBJECTIVE: Patient reports he is doing well. He does some of his exercises when he  can.  PAIN:  Are you having pain?  Yes: NPRS scale: 5/10 Pain location: Lower back (right > left) Pain description: Dull ache, tight Aggravating factors: Bending forward Relieving factors: Rest  PATIENT GOALS: Pain relief and improve energy level   OBJECTIVE:  PATIENT SURVEYS:  FOTO 48% functional status  MUSCLE LENGTH: Patient demonstrates hamstring flexibility deficits   POSTURE:  Rounded shoulder posture, decreased lumbar lordosis   PALPATION: Mildly tender to palpation of right lumbar paraspinals   LUMBAR ROM:    Active  A/PROM  06/14/2021  Flexion 75%  Extension 50%  Right lateral flexion 50%  Left lateral flexion 50%  Right rotation 50%  Left rotation 50%    LE MMT:   MMT Right 06/14/2021 Left 06/14/2021 Rt / Lt 06/24/2021  Hip flexion 4- 4   Hip extension 3 3   Hip abduction 3- 3 3- / 3  Knee flexion 4 4+   Knee extension 4 4+    FUNCTIONAL TESTS:  5xSTS: 17 seconds   GAIT: Assistive device utilized: Single point cane Level of assistance: Modified independence Comments: patient demonstrates wide BOS, bilateral toe out, compensated trendelenburg on right, mildly unstable     TODAY'S TREATMENT  [facility] Adult PT Treatment:                                                DATE: 07/06/2021 Therapeutic Exercise: NuStep L7 x 6 min with UE/LE to work on aerobic capacity and tolerance LTR x 10 Bridge 2 x 10 Hooklying clamshell with blue 2 x 10 SLR 2 x 10 Sidelying hip abduction 2 x 10 each Sit to stand 2 x 10 LAQ with 3# 2 x 10 each Standing heel raises 2 x 20 Neuro Re-ed: Rocker board fwd/bwd and lateral taps 2 x 1 min each Lateral walking with FM 3# x 5 each (belt)   [facility] Adult PT Treatment:                                                DATE: 06/24/2021 Therapeutic Exercise: NuStep L7 x 6 min with UE/LE to work  on aerobic capacity and tolerance Leg press 55# 3 x 10 Bridge 2 x 10 SLR 2 x 10 Sidelying hip abduction 2 x 10 each Piriformis stretch 2  x 30 sec each Hamstring stretch 2 x 30 sec each Neuro Re-ed: 1/2 Tandem on Airex 3 x 30 sec each Rocker board fwd/bwd and lateral taps 2 x 1 min each Backward walking with FM 7# x 5 (belt) Lateral walking with FM 3# x 5 each (belt)  [facility] Adult PT Treatment:                                                DATE: 06/22/2021 Therapeutic Exercise: NuStep L5 x 6 min with UE/LE to work on aerobic capacity and tolerance Sit to stand 2 x 10 - without UE assist LTR x 10 Bridge 2 x 10 SLR 2 x 10 Clamshell with blue 2 x 20 Sidelying hip abduction 2 x 10 each Neuro Re-ed: Rhomberg stance on Airex 2 x 60 sec 1/2 Tandem on Airex 3 x 30 sec each Backward walking with FM 7# x 5 Lateral walking with FM 3# x 5 each   PATIENT EDUCATION:  Education details: HEP Person educated: Patient Education method: Consulting civil engineer, Demonstration, Corporate treasurer cues, Verbal cues Education comprehension: verbalized understanding, returned demonstration, verbal cues required, tactile cues required, and needs further education   HOME EXERCISE PROGRAM: Access Code: [email or web address]     ASSESSMENT: CLINICAL IMPRESSION: Patient tolerated therapy well with no adverse effects. Therapy continues to focus on progression of strength and balance/standing stability training with good tolerance. He does continue to exhibit gross limitations in strength and balance deficits with his walking. He does require occasional UE support with balance but seems to be improving overall. No changes to HEP this visit. Patient would benefit from continued skilled PT to progress his mobility and strength in order to reduce pain and maximize his functional ability.    OBJECTIVE IMPAIRMENTS Abnormal gait, decreased activity tolerance, decreased balance, difficulty walking, decreased ROM, decreased strength, impaired flexibility, postural dysfunction, and pain.    ACTIVITY LIMITATIONS community activity, meal prep, yard work, and shopping.    PERSONAL FACTORS  Age, Fitness, Past/current experiences, and Time since onset of injury/illness/exacerbation are also affecting patient's functional outcome.      GOALS: Goals reviewed with patient? Yes   SHORT TERM GOALS: Target date: 07/12/2021   Patient will be I with initial HEP in order to progress with therapy. Baseline: HEP provided at eval Goal status: INITIAL   2.  PT will review FOTO with patient by 3rd visit in order to understand expected progress and outcome with therapy. Baseline: assessed at eval Goal status: INITIAL   3.  Patient will report low back pain </= 2/10 in order to reduce functional limitations Baseline: 5/10  Goal status: INITIAL   LONG TERM GOALS: Target date: 08/09/2021   Patient will be I with final HEP to maintain progress from PT. Baseline: HEP provided at eval Goal status: INITIAL   2.  Patient will report >/= 57% status on FOTO to indicate improved functional ability. Baseline: 48% functional status Goal status: INITIAL   3.  Patient will perform 5xSTS </= 12 seconds to indicate improved strength and reduced fall risk to improve safety Baseline: 17 seconds Goal status: INITIAL   4.  Patient will demonstrate bilateral knee strength 5/5 MMT and bilateral  hip strength grossly >/= 4-/5 MMT in order to improve gait and mobility to reduce fatigue and endurance deficits Baseline: knee strength </= 4+/5, hip strength </= 3/5 Goal status: INITIAL     PLAN: PT FREQUENCY: 1-2x/week   PT DURATION: 8 weeks   PLANNED INTERVENTIONS: Therapeutic exercises, Therapeutic activity, Neuromuscular re-education, Balance training, Gait training, Patient/Family education, Joint manipulation, Joint mobilization, Aquatic Therapy, Dry Needling, Electrical stimulation, Spinal manipulation, Spinal mobilization, Cryotherapy, Moist heat, and Manual therapy.   PLAN FOR NEXT SESSION: Review HEP and progress PRN, NuStep for endurance training, LE strengthening, balance and gait  training    Hilda Blades, PT, DPT, LAT, ATC 07/06/21  1:11 PM Phone: [phone] Fax: [phone]     PHYSICAL THERAPY DISCHARGE SUMMARY  Visits from Start of Care: 5  Current functional level related to goals / functional outcomes: See above   Remaining deficits: See above   Education / Equipment: HEP   Patient agrees to discharge. Patient goals were not met. Patient is being discharged due to not returning since the last visit.

## 2021-07-06 ENCOUNTER — Encounter: Payer: Self-pay | Admitting: Physical Therapy

## 2021-07-06 ENCOUNTER — Other Ambulatory Visit: Payer: Self-pay

## 2021-07-06 ENCOUNTER — Ambulatory Visit: Payer: Medicare Other | Attending: Internal Medicine | Admitting: Physical Therapy

## 2021-07-06 DIAGNOSIS — M545 Low back pain, unspecified: Secondary | ICD-10-CM | POA: Diagnosis not present

## 2021-07-06 DIAGNOSIS — M6281 Muscle weakness (generalized): Secondary | ICD-10-CM | POA: Insufficient documentation

## 2021-07-06 DIAGNOSIS — R2689 Other abnormalities of gait and mobility: Secondary | ICD-10-CM | POA: Diagnosis not present

## 2021-07-06 DIAGNOSIS — G8929 Other chronic pain: Secondary | ICD-10-CM | POA: Insufficient documentation

## 2021-07-07 ENCOUNTER — Ambulatory Visit (INDEPENDENT_AMBULATORY_CARE_PROVIDER_SITE_OTHER): Payer: Medicare Other | Admitting: Cardiology

## 2021-07-07 ENCOUNTER — Encounter: Payer: Medicare Other | Admitting: Physical Therapy

## 2021-07-07 ENCOUNTER — Encounter: Payer: Self-pay | Admitting: Cardiology

## 2021-07-07 VITALS — BP 116/50 | HR 44 | Ht 69.5 in | Wt 175.0 lb

## 2021-07-07 DIAGNOSIS — E785 Hyperlipidemia, unspecified: Secondary | ICD-10-CM | POA: Diagnosis not present

## 2021-07-07 DIAGNOSIS — I700 Atherosclerosis of aorta: Secondary | ICD-10-CM

## 2021-07-07 DIAGNOSIS — D6869 Other thrombophilia: Secondary | ICD-10-CM

## 2021-07-07 DIAGNOSIS — N1832 Chronic kidney disease, stage 3b: Secondary | ICD-10-CM

## 2021-07-07 DIAGNOSIS — I2510 Atherosclerotic heart disease of native coronary artery without angina pectoris: Secondary | ICD-10-CM

## 2021-07-07 DIAGNOSIS — Z9861 Coronary angioplasty status: Secondary | ICD-10-CM | POA: Diagnosis not present

## 2021-07-07 DIAGNOSIS — I25119 Atherosclerotic heart disease of native coronary artery with unspecified angina pectoris: Secondary | ICD-10-CM

## 2021-07-07 DIAGNOSIS — R5382 Chronic fatigue, unspecified: Secondary | ICD-10-CM

## 2021-07-07 DIAGNOSIS — I480 Paroxysmal atrial fibrillation: Secondary | ICD-10-CM

## 2021-07-07 DIAGNOSIS — E669 Obesity, unspecified: Secondary | ICD-10-CM

## 2021-07-07 DIAGNOSIS — I5042 Chronic combined systolic (congestive) and diastolic (congestive) heart failure: Secondary | ICD-10-CM | POA: Diagnosis not present

## 2021-07-07 DIAGNOSIS — Z9989 Dependence on other enabling machines and devices: Secondary | ICD-10-CM

## 2021-07-07 NOTE — Patient Instructions (Addendum)
Medication Instructions:  ? ? WEAN  CARVEDILOL (COREG) TAKE  1/2 TABLET OF 3.125 MG  TWICE A DAY FOR 2 WEEKS  THEN  1/2 TABLET OF 3.125 MG FOR ONE WEEK THEN STOP . ? ? ?*If you need a refill on your cardiac medications before your next appointment, please call your pharmacy* ? ? ?Lab Work: ? ?NOT NEEDED ? ? ?Testing/Procedures: ? ?NOT NEEDED ? ?Follow-Up: ?At [facility], you and your health needs are our priority.  As part of our continuing mission to provide you with exceptional heart care, we have created designated Provider Care Teams.  These Care Teams include your primary Cardiologist (physician) and Advanced Practice Providers (APPs -  Physician Assistants and Nurse Practitioners) who all work together to provide you with the care you need, when you need it. ? ?  ? ?Your next appointment:   ?6 month(s) ? ?The format for your next appointment:   ?In Person ? ?Provider:   ?Glenetta Hew, MD  ? ? ? ?

## 2021-07-07 NOTE — Progress Notes (Addendum)
? ? ?Primary Care Provider: Lavone Orn, MD ?Cardiologist: Glenetta Hew, MD ?Electrophysiologist: None ? ?Clinic Note: ?Chief Complaint  ?Patient presents with  ?? Follow-up  ?  Noting fatigue, weakness and couple falls.  ?? Coronary Artery Disease  ?  No angina  ?? Cardiomyopathy  ?  Other than exertional dyspnea/fatigue, not having CHF symptoms.  ? ?=================================== ? ?ASSESSMENT/PLAN  ? ?Problem List Items Addressed This Visit   ? ?  ? Cardiology Problems  ? CAD S/P PCI: Cypher DES x2 to LCx (2006); 07/2020: Additional DES stent LCx, with stent in LAD (Chronic)  ?  Extensive PCI now to the LAD and LCx. ? ?Has completed 1 year clopidogrel as of May 1-we will discontinue clopidogrel, and continue Eliquis alone. ? ?  ?  ? Relevant Orders  ? EKG 12-Lead (Completed)  ? PAF (paroxysmal atrial fibrillation) ; CHA2DS2-VASc Score =6 (Eliquis) (Chronic)  ?  Hard to tell, does not seem like he is having any breakthrough episodes of A-fib.  Unfortunately even on low-dose beta-blocker he is extremely fatigued. ? ?We have already discontinued the diltiazem and reduced carvedilol dose => we will go ahead and wean off carvedilol-couple weeks. ? ?Continue Eliquis for DOAC. ? ?  ?  ? Relevant Orders  ? EKG 12-Lead (Completed)  ? Hypercoagulable state due to PAF [facility]) Eliquis with CHA2DS2VASc of 6 (Chronic)  ?  Doing well.  No major bleeding issues. ? ?Currently on Eliquis plus Plavix.  Plan is to discontinue Plavix as of May 1. ? ?Continue Eliquis. ?Okay to hold Eliquis for procedures or surgeries-per protocol. ?  ?  ? Chronic combined systolic and diastolic heart failure (HCC) (Chronic)  ?  Mild to moderately reduced EF of 40 to 45% with apical akinesis.  He has exertional dyspnea that is probably as much related to his deconditioning and spinal stenosis issues.  No real PND orthopnea.  ?Difficult to assess his symptomatology based on his otherwise limiting musculoskeletal issues.  At least NYHA class  II ? ?Plan: Continue telmisartan and spironolactone along with chlorthalidone at low-dose.  (He has Benicar HCTZ on his med list which is no longer active.) ? ?  ?  ? Relevant Orders  ? EKG 12-Lead (Completed)  ? Hyperlipidemia LDL goal <70 (Chronic)  ?  She will be due for labs checked in the next couple months.  Nexletol last year. ?Continue current dose of statin-Crestor 20 mg daily ? ?  ?  ? Aortic atherosclerosis (HCC) (Chronic)  ?  Cardiovascular risk modification with blood pressure and lipid control.  Close glycemic control ? ?  ?  ? Coronary artery disease involving native coronary artery of native heart with angina pectoris ([place]) - Primary (Chronic)  ?  Unfortunate, he really did have that much we have significant disease related symptoms other than some nagging symptoms and that fatigue.  He now has essentially occluded RCA with severe disease in the LCx and LAD treated with DES stents.  His anginal symptoms were somewhat atypical in nature, seems to be stable now. ?Echocardiogram was consistent with known occlusion of the RCA with LAD disease. ?Unfortunately, he is extremely fatigued making beta-blocker no longer an option. ? ?Plan: ?Wean off of the carvedilol because of fatigue ?Discontinue Plavix as of May 1 ?Continue current dose of telmisartan, spironolactone and rosuvastatin. ?  ?  ? Relevant Orders  ? EKG 12-Lead (Completed)  ?  ? Other  ? Stage 3b chronic kidney disease (CKD) (HCC) (Chronic)  ?  Creatinine is roughly 2 January.  Has been stable.  Careful use of chlorthalidone and spironolactone. ? ?  ?  ? Chronic fatigue  ?  Unfortunately, his body is starting about 11.  He is still [place] or tachycardic, mentally, but is depressed by his inability to do what he would like to do.  He has significant balance issues and unsteady gait that limits his exercise.  He now has reduced EF and has more notable symptoms that are concerning or depression. ?Thankfully, in the last few months he is doing a  lot of traveling spending time with his family. ? ?We are gradually reducing medications, and this current incident we are stopping his beta-blocker altogether. ?Would recommend allowing for mild permissive hypertension which would potentially mean stopping chlorthalidone and future evaluations. ?We have tried statin holiday in the past that did not make much difference. ? ?  ?  ? ? ?=================================== ? ?HPI:   ? ?[name] is a 86 y.o. male with a PMH notable for CAD, PAF and ICM (EF 40 to 45% by echo) who presents today for 56-monthfollow-up. ? ?May 2006: DES PCI LCx x 2 ==> several ST & Echos followed - no notable Sx.  ?07/2020 -> continued complaints of mild nagging symptoms that were concerning for angina => abnormal/high risk Myoview =>  ?Cath Aug 03, 2020 -> 3B CAD => ~CTO of RCA (w/ collaterals), severe disease of LAD as well as LCx=> Aug 04, 2020 Staged PCI LAD & LCx-OM, Med Rx of RCA ? ?Aug 26, 2020: frustrated with ongoing fatigue.  No chest pain. => Restarted on Zoloft (that was converted to Lexapro), and asked to reduce his Eliquis Dr. SHalford Chessman?Sept 27, 2022 => still noted feeling tired and fatigued.  No energy orthostatic dizziness.  Hypotensive.  Otherwise no CHF symptoms of PND orthopnea edema.  Had just graduated from cardiac rehab. => Diltiazem discontinued and Micardis dose reduced to 20 mg.  2D echo ordered to reassess EF. ?2D Echo January 19, 2021 => EF 40 to 45%.  Apical akinesis.  Consistent with RCA occlusion and LAD disease. ? ?Charles DEVERwas last seen on April 18, 2021 indicated that he was depressed.  Not a lot of energy.  Sleeping usual.  Very upset and stressed out about the fact that his wife having just recovered from breast cancer has not had to have cholecystectomy surgery.  Not noticing any chest pain or dyspnea just exercise intolerance with fatigue.  Mild stable edema but no PND or orthopnea.  Chlorthalidone dose was down to one half tab.  Unfortunately,  once he completed his cardiac rehab, he did not continue with exercise. ?=> BP was 140/70 with a heart rate of 48.  Carvedilol dose was reduced to 3.125 mg twice daily. ? ?Seen by CRollen Sox RPH-[facility] for hypertension management on January 25th => converted chlorthalidone and telmisartan to combination olmesartan and HCTZ ?Followed up on June 10, 2021-BP was okay, but he had not yet switched to the olmesartan and HCTZ having not yet completed existing bottle of telmisartan/chlorthalidone along with spironolactone which had not been listed on his med list..  Still noted fatigue.  However [place] ?Decided against converting to olmesartan-HCTZ. ?Referred to GI for consultation--> frequent GI upset ? ?Recent Hospitalizations: NA ? ?Reviewed  CV studies:   ? ?The following studies were reviewed today: (if available, images/films reviewed: From Epic Chart or Care Everywhere) ?01/19/2021 TTE: EF  40 to 45%.  Apical akinesis-concerning for LAD infarction.  Moderate concentric LVH.  Normal RV function.  (Consistent with known RCA occlusion with LAD disease ? ?Interval History:  ? ?[name] returns today for routine follow-up still feeling weak and lethargic.  He has had several falls.  He is more depressed than before.  Thankfully, he is not having any chest pain or pressure.  He has lost weight for not eating as much.  Denies any heart failure symptoms of PND, orthopnea with only trivial edema.  No palpitations. ? ?Activity still limited by back pain and leg weakness. ? ?CV Review of Symptoms (Summary) ?Cardiovascular ROS: positive for - dyspnea on exertion and fatigue, weakness, exercise intolerance ?negative for - chest pain, edema, irregular heartbeat, orthopnea, palpitations, paroxysmal nocturnal dyspnea, rapid heart rate, shortness of breath, or syncope or near syncope, just falls.  Not walking enough to note claudication ? ?REVIEWED OF SYSTEMS  ? ?Review of  Systems  ?Constitutional:  Positive for malaise/fatigue and weight loss.  ?HENT:  Negative for congestion and nosebleeds.   ?Respiratory:  Positive for shortness of breath (With exertion). Negative for cough.

## 2021-07-08 ENCOUNTER — Encounter: Payer: Medicare Other | Admitting: Physical Therapy

## 2021-07-13 ENCOUNTER — Encounter: Payer: Self-pay | Admitting: Gastroenterology

## 2021-07-13 ENCOUNTER — Ambulatory Visit (INDEPENDENT_AMBULATORY_CARE_PROVIDER_SITE_OTHER): Payer: Medicare Other | Admitting: Gastroenterology

## 2021-07-13 ENCOUNTER — Encounter: Payer: Medicare Other | Admitting: Physical Therapy

## 2021-07-13 VITALS — BP 112/74 | HR 48 | Ht 69.0 in | Wt 175.0 lb

## 2021-07-13 DIAGNOSIS — Z9861 Coronary angioplasty status: Secondary | ICD-10-CM | POA: Diagnosis not present

## 2021-07-13 DIAGNOSIS — R159 Full incontinence of feces: Secondary | ICD-10-CM

## 2021-07-13 DIAGNOSIS — I2510 Atherosclerotic heart disease of native coronary artery without angina pectoris: Secondary | ICD-10-CM

## 2021-07-13 DIAGNOSIS — R194 Change in bowel habit: Secondary | ICD-10-CM | POA: Insufficient documentation

## 2021-07-13 NOTE — Patient Instructions (Signed)
Janett Billow recommends that you complete a bowel purge (to clean out your bowels). Please do the following: ?Purchase a bottle of Miralax over the counter as well as a box of 5 mg dulcolax tablets. ?Take 4 dulcolax tablets. ?Wait 1 hour. ?You will then drink 6-8 capfuls of Miralax mixed in an adequate amount of water/juice/gatorade (you may choose which of these liquids to drink) over the next 2-3 hours. ?You should expect results within 1 to 6 hours after completing the bowel purge. ? ?Start Miralax 1 capful daily in 8 ounces of liquid, after completing bowel purge. ? ?Start Benefiber 2 teaspoons in 8 ounces of liquid daily. ? ?Call back in 7-10 days with an update, ask for [place], RN. ?

## 2021-07-13 NOTE — Progress Notes (Signed)
Attending Physician's Attestation  ? ?I have reviewed the chart.  ? ?I agree with the Advanced Practitioner's note, impression, and recommendations with any updates as below. ?Based on age and medical comorbidities, does make sense to try to minimize endoscopic evaluation if possible.  However with weight loss we need to be vigilant.  If patient continues to have issues then do recommend cross-sectional imaging as outlined so that we can exclude any sort of mass/lesion that could be in place.  He has not had a colonoscopy for 16 years.  Recommend scheduling follow-up in clinic if it has not already been scheduled to see where things go. ? ? ?Justice Britain, MD ?[facility] ?Advanced Endoscopy ?Office # [phone] ? ?

## 2021-07-13 NOTE — Progress Notes (Signed)
? ? ? ?07/13/2021 ?[name] ?[id number] ?[birth year] ? ? ?HISTORY OF PRESENT ILLNESS: This is an 86 year old male who was previously a patient of Dr. Nichola Sizer.  Not seen here since 2013.  He is here today for change in bowel habits.  He says that he used to have a bowel movement regularly every day.  Now sometimes he will skip 2 or 3 days without having a bowel movement and then will have episodes of loose stools with urgency and incontinence.  This has been happening quite a bit for the past month or so.  He was on metformin 500 mg twice daily and his PCP just decreased that to once daily dosing about a week ago and he really has not had as many issues this week.  He actually had started taking MiraLAX for about a week or so and had done well with that, but then stopped it because he went out of town and he thought he was doing better.  He denies seeing blood in his stool.  He has a history of back surgery for tumor several years ago that has affected his walking and he was told that it could affect his ability to hold his stool, etc. his weight is down about 10 to 12 pounds over the past several months.   ? ?Last colonoscopy was in 2007. ? ?He has multiple medical problems including atrial fibrillation on Eliquis, history of coronary artery disease on Plavix, diabetes, hypertension, OSA on CPAP, CKD. ? ?Past Medical History:  ?Diagnosis Date  ? Arthritis   ? Atrial fibrillation ([place])   ? CAD S/P percutaneous coronary angioplasty 08/2004  ? a) CARDIOLOGIST-  DR DAVID HARDING; DES to LCx. X2  - Cypher 2.5 mm postdilated to 2.75 mm (20 mm and 13 mm stents);; Myoview 07/2020 ++ -> Cath with diffuse 90-100% CTO of p-dRCA (L-R collaterals), mid LAD 75 (SYNERGY DES 2.5 X 20 - 3.1>2.8), Mid-Distal LCx 65% & 70% prior to patent stent --> DES PCI (overlaps old) - SYNERGY 2.75 X 38 -> 3.1 mm)  ? Cardiac arrhythmia   ? Chronic kidney disease   ? Colon polyps 05/10/2005  ? Hyperplastic polyps  ? Complication of anesthesia    ? "takes long time to wake up"  ? Depression   ? Diabetes mellitus type 2 with complications ([place])   ? neuropathy; CAD borderline- no med  ? Diastolic dysfunction without heart failure   ? Moderate LVH, Gr 1 DD on Echo 2011; no CHF admissions, no tt on diuretisc  ? Essential hypertension   ? H/O: gout STABLE  ? History of melanoma excision 2010  ? FOREHEAD  ? History of prostate cancer 2004--  S/P SEED IMPLANTS   ? NO RECURRENCE  ? Hydrocele, left   ? Hyperlipidemia LDL goal <70   ? Hypertension   ? Impaired hearing RIGHT HEARING AID  ? OSA on CPAP   ? bipap  ? PAF (paroxysmal atrial fibrillation) ([place]) 12/11/2017  ? Admitted with Afib RVR - converted on IV Diltiazem. d/c on Eliquis; CHA2DS2Vasc = 5.  ? Prostate cancer ([place])   ? prostate , melanoma head  ? Shingles 2007  ? Syncope and collapse 06/2015  ? Likely related to post micturition, Neurontin and dehydration with orthostatic hypotension  ? Urgency of urination   ? ?Past Surgical History:  ?Procedure Laterality Date  ? Cardiac Event Monitor  3-07/2015  ? Mostly SR - 58-132 bpm. Rare PVCs (occ bigeminy), Frequent PACs - singlets, couplets -  short runs of PAT.  ? circumsision    ? CORONARY ANGIOPLASTY WITH STENT PLACEMENT  09/01/2004  ? Cypher DES x 2 d-m LCx 2.5 mm x 20 mm & 2.5 mm x 13 mm (~2.75 mm)  ? CORONARY STENT INTERVENTION N/A 08/04/2020  ? Procedure: CORONARY STENT INTERVENTION;  Surgeon: Leonie Man, MD;  Location: [facility];  Service: Cardiovascular;; STAGED: mid LAD 75% (SYNERGY DES 2.5 X 20 --> 3.1-28 mm post-dilation; Mid LCx 65% - distal 70% prior to old stents => DES PCI (overlapps old stent & crossing OM1) SYNERGY DES 2.75 X 38 -> 3.1 mm)  ? EXCISION MELANOMA FROM FOREHEAD  2010  ? EYE SURGERY Bilateral   ? cataracts  ? HYDROCELE EXCISION  10/14/2011  ? Procedure: HYDROCELECTOMY ADULT;  Surgeon: Ailene Rud, MD;  Location: [facility];  Service: Urology;  Laterality: Left;  ? LAMINECTOMY N/A 07/13/2012  ?  Procedure: THORACIC LAMINECTOMY FOR RESECTION OF INTRAMEDULLARY SPINAL CHORD TUMOR WITH SPINAL CHORD MONITORING;  Surgeon: Erline Levine, MD;  Location: [facility];  Service: Neurosurgery;  Laterality: N/A;  Thoracic laminectomy for resection of intramedullary spinal cord tumor with spinal cord monitering and Dr. Christella Noa to assist  ? LAMINECTOMY N/A 12/09/2014  ? Procedure: Redo Thoracic laminectomy with intramedullary tumar resection/USN/[place]/Subarachnoid shunt/spinal cord monitoring/Dr. Kathyrn Sheriff to assist;  Surgeon: Erline Levine, MD;  Location: [facility];  Service: Neurosurgery;  Laterality: N/A;  Redo Thoracic laminectomy with intramedullary tumar resection/USN/[place]/Subarachnoid shunt/spinal cord monitoring/Dr. Kathyrn Sheriff to assist  ? LEFT HEART CATH AND CORONARY ANGIOGRAPHY N/A 08/03/2020  ? Procedure: LEFT HEART CATH AND CORONARY ANGIOGRAPHY;  Surgeon: Leonie Man, MD;  Location: [facility];;Proximal-mid RCA 99% with 90% side branch R VM.  Distal RCA 100%.  Proximal LAD 40%.  Mid LAD 75%.  Proximal LCx 65%.  Proximal-mid LCx 30%.  Mid LCx 70% with widely patent mid to distal LCx stent patent.  Mildly elevated LVEDP. - staged PCI LAD & LCx  ? NM MYOVIEW LTD  3/212014; June 2016  ? Both Lexiscan: a. LOW RISK, mild inferior bowel artifact; No ischemia or Infarction; b. Low risk, normal study. No infarction or ischemia. No artifact.  ? NM MYOVIEW LTD  07/09/2020  ? Fixed mid to apical anteroseptal perfusion defect with hypokinesia in this area consistent with prior infarct.  EF estimated 50%.  Also fixed inferior perfusion defect and apical defect with normal wall motion consistent with artifact.  Read as low risk, no ischemia, however there is a significant anterior defect not present on previous study..  ? PROSTATE PALLADIUM GOLD SEED IMPLANTS (118)  11/15/2002  ? PROSTATE CANCER  ? SKIN BIOPSY    ? TONSILLECTOMY    ? TRANSTHORACIC ECHOCARDIOGRAM  12/2017  ? (A. fib): Moderate concentric LVH.  EF  55 to 60%.  No wall motion normality.  Mild to moderately dilated left atrium.  ? ? reports that he has never smoked. He has never used smokeless tobacco. He reports that he does not currently use alcohol. He reports that he does not use drugs. ?family history includes Heart disease in his father and mother; Hypertension in his brother, father, and mother; Prostate cancer in his brother and father; Stroke in his mother. ?Allergies  ?Allergen Reactions  ? Betadine [Povidone Iodine] Anaphylaxis  ? Contrast Media [Iodinated Contrast Media] Anaphylaxis and Rash  ?  Other Reaction: Other reaction  ? Iodine Anaphylaxis  ?  Shellfish, IVP dye. Swelling in throat and foaming  at mouth. ?Other reaction(s): foam at mouth  ? Shellfish Allergy Anaphylaxis  ? Spironolactone Other (See Comments)  ?   Hyperkalemia   ? Gabapentin Other (See Comments)  ?  Other reaction(s): dizzy, fainted  ? ? ?  ?Outpatient Encounter Medications as of 07/13/2021  ?Medication Sig  ? apixaban (ELIQUIS) 2.5 MG TABS tablet Take 1 tablet (2.5 mg total) by mouth 2 (two) times daily.  ? carvedilol (COREG) 6.25 MG tablet Take 12.5 mg by mouth 2 (two) times daily.  ? Cholecalciferol (VITAMIN D3) 50 MCG (2000 UT) CAPS Take 2,000 Units by mouth daily.  ? clopidogrel (PLAVIX) 75 MG tablet TAKE 1 TABLET BY MOUTH EVERY DAY  ? escitalopram (LEXAPRO) 20 MG tablet Take 20 mg by mouth daily.  ? ketoconazole (NIZORAL) 2 % cream Apply 1 application. topically 2 (two) times daily.  ? metFORMIN (GLUCOPHAGE) 500 MG tablet Take 1 tablet (500 mg total) by mouth 2 (two) times daily with a meal. (Patient taking differently: Take 500 mg by mouth daily.)  ? nitroGLYCERIN (NITROSTAT) 0.4 MG SL tablet Place 1 tablet (0.4 mg total) under the tongue every 5 (five) minutes as needed for chest pain.  ? olmesartan-hydrochlorothiazide (BENICAR HCT) 40-12.5 MG tablet TAKE 1 TABLET BY MOUTH EVERY DAY  ? ONETOUCH ULTRA test strip USE TO TEST BLOOD SUGAR ONCE OR TWICE A DAY (E11.22)  ?  oxybutynin (DITROPAN XL) 15 MG 24 hr tablet Take 1 tablet (15 mg total) by mouth at bedtime.  ? rosuvastatin (CRESTOR) 20 MG tablet Take 20 mg by mouth every evening.  ? tamsulosin (FLOMAX) 0.4 MG CAPS c

## 2021-07-15 ENCOUNTER — Encounter: Payer: Medicare Other | Admitting: Physical Therapy

## 2021-07-20 ENCOUNTER — Telehealth: Payer: Self-pay

## 2021-07-20 NOTE — Telephone Encounter (Signed)
-----   Message from Loralie Champagne, PA-C sent at 07/20/2021  3:28 PM EDT ----- ?Will you please call him for an update.  See how he has been doing with the MiraLAX and Benefiber bowel regimen that we discussed.  If he is doing better then I would still like to see him back again maybe in 4 more weeks or so just to recheck his weight and make sure that is not continuing to decline and to make sure he is continuing to do well on the bowel regimen. ? ?Thank you, ? ?Jess ? ?----- Message ----- ?From: Mansouraty, Telford Nab., MD ?Sent: 07/13/2021   5:14 PM EDT ?To: Loralie Champagne, PA-C ? ? ? ? ?----- Message ----- ?From: Loralie Champagne, PA-C ?Sent: 07/13/2021   1:14 PM EDT ?To: Irving Copas., MD ? ? ?

## 2021-07-20 NOTE — Telephone Encounter (Signed)
The pt states that he is doing much better now. He never started the miralax or benefiber. He said his bowels are normal.  He did make a follow up appt for 08/11/21 at 10 am.  He will call if things change.   ?

## 2021-07-20 NOTE — Telephone Encounter (Signed)
Left message on machine to call back  

## 2021-07-23 DIAGNOSIS — N1832 Chronic kidney disease, stage 3b: Secondary | ICD-10-CM | POA: Diagnosis not present

## 2021-07-23 DIAGNOSIS — E1122 Type 2 diabetes mellitus with diabetic chronic kidney disease: Secondary | ICD-10-CM | POA: Diagnosis not present

## 2021-07-23 DIAGNOSIS — I480 Paroxysmal atrial fibrillation: Secondary | ICD-10-CM | POA: Diagnosis not present

## 2021-07-23 DIAGNOSIS — I5022 Chronic systolic (congestive) heart failure: Secondary | ICD-10-CM | POA: Diagnosis not present

## 2021-07-23 DIAGNOSIS — E782 Mixed hyperlipidemia: Secondary | ICD-10-CM | POA: Diagnosis not present

## 2021-07-23 DIAGNOSIS — I10 Essential (primary) hypertension: Secondary | ICD-10-CM | POA: Diagnosis not present

## 2021-08-01 DIAGNOSIS — I10 Essential (primary) hypertension: Secondary | ICD-10-CM | POA: Diagnosis not present

## 2021-08-03 DIAGNOSIS — Z23 Encounter for immunization: Secondary | ICD-10-CM | POA: Diagnosis not present

## 2021-08-11 ENCOUNTER — Ambulatory Visit: Payer: Medicare Other | Admitting: Gastroenterology

## 2021-08-15 ENCOUNTER — Encounter: Payer: Self-pay | Admitting: Cardiology

## 2021-08-15 NOTE — Assessment & Plan Note (Signed)
Extensive PCI now to the LAD and LCx. ? ?Has completed 1 year clopidogrel as of May 1-we will discontinue clopidogrel, and continue Eliquis alone. ?

## 2021-08-15 NOTE — Assessment & Plan Note (Signed)
Creatinine is roughly 2 January.  Has been stable.  Careful use of chlorthalidone and spironolactone. ?

## 2021-08-15 NOTE — Assessment & Plan Note (Signed)
Doing well.  No major bleeding issues. ? ?? Currently on Eliquis plus Plavix.  Plan is to discontinue Plavix as of May 1. ? ?? Continue Eliquis. ?? Okay to hold Eliquis for procedures or surgeries-per protocol. ?

## 2021-08-15 NOTE — Assessment & Plan Note (Signed)
Hard to tell, does not seem like he is having any breakthrough episodes of A-fib.  Unfortunately even on low-dose beta-blocker he is extremely fatigued. ? ?We have already discontinued the diltiazem and reduced carvedilol dose => we will go ahead and wean off carvedilol-couple weeks. ? ?Continue Eliquis for DOAC. ?

## 2021-08-15 NOTE — Assessment & Plan Note (Signed)
She will be due for labs checked in the next couple months.  Nexletol last year. ?Continue current dose of statin-Crestor 20 mg daily ?

## 2021-08-15 NOTE — Assessment & Plan Note (Signed)
Mild to moderately reduced EF of 40 to 45% with apical akinesis.  He has exertional dyspnea that is probably as much related to his deconditioning and spinal stenosis issues.  No real PND orthopnea.  ?Difficult to assess his symptomatology based on his otherwise limiting musculoskeletal issues.  At least NYHA class II ? ?Plan: Continue telmisartan and spironolactone along with chlorthalidone at low-dose.  (He has Benicar HCTZ on his med list which is no longer active.) ?

## 2021-08-15 NOTE — Assessment & Plan Note (Signed)
Cardiovascular risk modification with blood pressure and lipid control.  Close glycemic control ?

## 2021-08-15 NOTE — Assessment & Plan Note (Signed)
Unfortunately, his body is starting about 11.  He is still [place] or tachycardic, mentally, but is depressed by his inability to do what he would like to do.  He has significant balance issues and unsteady gait that limits his exercise.  He now has reduced EF and has more notable symptoms that are concerning or depression. ?Thankfully, in the last few months he is doing a lot of traveling spending time with his family. ? ?We are gradually reducing medications, and this current incident we are stopping his beta-blocker altogether. ?Would recommend allowing for mild permissive hypertension which would potentially mean stopping chlorthalidone and future evaluations. ?We have tried statin holiday in the past that did not make much difference. ?

## 2021-08-15 NOTE — Assessment & Plan Note (Addendum)
Unfortunate, he really did have that much we have significant disease related symptoms other than some nagging symptoms and that fatigue.  He now has essentially occluded RCA with severe disease in the LCx and LAD treated with DES stents.  His anginal symptoms were somewhat atypical in nature, seems to be stable now. ?Echocardiogram was consistent with known occlusion of the RCA with LAD disease. ?Unfortunately, he is extremely fatigued making beta-blocker no longer an option. ? ?Plan: ?? Wean off of the carvedilol because of fatigue ?? Discontinue Plavix as of May 1 ?? Continue current dose of telmisartan, spironolactone and rosuvastatin. ?

## 2021-09-01 DIAGNOSIS — I10 Essential (primary) hypertension: Secondary | ICD-10-CM | POA: Diagnosis not present

## 2021-09-03 DIAGNOSIS — N281 Cyst of kidney, acquired: Secondary | ICD-10-CM | POA: Diagnosis not present

## 2021-09-03 DIAGNOSIS — E1122 Type 2 diabetes mellitus with diabetic chronic kidney disease: Secondary | ICD-10-CM | POA: Diagnosis not present

## 2021-09-03 DIAGNOSIS — I5022 Chronic systolic (congestive) heart failure: Secondary | ICD-10-CM | POA: Diagnosis not present

## 2021-09-03 DIAGNOSIS — D649 Anemia, unspecified: Secondary | ICD-10-CM | POA: Diagnosis not present

## 2021-09-03 DIAGNOSIS — I129 Hypertensive chronic kidney disease with stage 1 through stage 4 chronic kidney disease, or unspecified chronic kidney disease: Secondary | ICD-10-CM | POA: Diagnosis not present

## 2021-09-03 DIAGNOSIS — E119 Type 2 diabetes mellitus without complications: Secondary | ICD-10-CM | POA: Diagnosis not present

## 2021-09-03 DIAGNOSIS — N1832 Chronic kidney disease, stage 3b: Secondary | ICD-10-CM | POA: Diagnosis not present

## 2021-09-03 DIAGNOSIS — E785 Hyperlipidemia, unspecified: Secondary | ICD-10-CM | POA: Diagnosis not present

## 2021-09-22 DIAGNOSIS — E782 Mixed hyperlipidemia: Secondary | ICD-10-CM | POA: Diagnosis not present

## 2021-09-22 DIAGNOSIS — E1122 Type 2 diabetes mellitus with diabetic chronic kidney disease: Secondary | ICD-10-CM | POA: Diagnosis not present

## 2021-09-22 DIAGNOSIS — I5022 Chronic systolic (congestive) heart failure: Secondary | ICD-10-CM | POA: Diagnosis not present

## 2021-09-22 DIAGNOSIS — I10 Essential (primary) hypertension: Secondary | ICD-10-CM | POA: Diagnosis not present

## 2021-09-22 DIAGNOSIS — N1832 Chronic kidney disease, stage 3b: Secondary | ICD-10-CM | POA: Diagnosis not present

## 2021-10-01 DIAGNOSIS — I10 Essential (primary) hypertension: Secondary | ICD-10-CM | POA: Diagnosis not present

## 2021-10-06 DIAGNOSIS — H65191 Other acute nonsuppurative otitis media, right ear: Secondary | ICD-10-CM | POA: Diagnosis not present

## 2021-10-12 DIAGNOSIS — L821 Other seborrheic keratosis: Secondary | ICD-10-CM | POA: Diagnosis not present

## 2021-10-12 DIAGNOSIS — Z789 Other specified health status: Secondary | ICD-10-CM | POA: Diagnosis not present

## 2021-10-12 DIAGNOSIS — L298 Other pruritus: Secondary | ICD-10-CM | POA: Diagnosis not present

## 2021-10-12 DIAGNOSIS — L538 Other specified erythematous conditions: Secondary | ICD-10-CM | POA: Diagnosis not present

## 2021-10-12 DIAGNOSIS — L820 Inflamed seborrheic keratosis: Secondary | ICD-10-CM | POA: Diagnosis not present

## 2021-11-01 DIAGNOSIS — I10 Essential (primary) hypertension: Secondary | ICD-10-CM | POA: Diagnosis not present

## 2021-11-08 DIAGNOSIS — E1122 Type 2 diabetes mellitus with diabetic chronic kidney disease: Secondary | ICD-10-CM | POA: Diagnosis not present

## 2021-11-08 DIAGNOSIS — N184 Chronic kidney disease, stage 4 (severe): Secondary | ICD-10-CM | POA: Diagnosis not present

## 2021-11-08 DIAGNOSIS — E782 Mixed hyperlipidemia: Secondary | ICD-10-CM | POA: Diagnosis not present

## 2021-11-08 DIAGNOSIS — I10 Essential (primary) hypertension: Secondary | ICD-10-CM | POA: Diagnosis not present

## 2021-11-12 DIAGNOSIS — E1122 Type 2 diabetes mellitus with diabetic chronic kidney disease: Secondary | ICD-10-CM | POA: Diagnosis not present

## 2021-11-12 DIAGNOSIS — I10 Essential (primary) hypertension: Secondary | ICD-10-CM | POA: Diagnosis not present

## 2021-11-12 DIAGNOSIS — R413 Other amnesia: Secondary | ICD-10-CM | POA: Diagnosis not present

## 2021-11-17 DIAGNOSIS — E538 Deficiency of other specified B group vitamins: Secondary | ICD-10-CM | POA: Diagnosis not present

## 2021-11-24 DIAGNOSIS — E538 Deficiency of other specified B group vitamins: Secondary | ICD-10-CM | POA: Diagnosis not present

## 2021-11-24 DIAGNOSIS — R413 Other amnesia: Secondary | ICD-10-CM | POA: Diagnosis not present

## 2021-11-24 DIAGNOSIS — G3184 Mild cognitive impairment, so stated: Secondary | ICD-10-CM | POA: Diagnosis not present

## 2021-11-24 DIAGNOSIS — E875 Hyperkalemia: Secondary | ICD-10-CM | POA: Diagnosis not present

## 2021-11-24 DIAGNOSIS — N184 Chronic kidney disease, stage 4 (severe): Secondary | ICD-10-CM | POA: Diagnosis not present

## 2021-11-25 DIAGNOSIS — E1122 Type 2 diabetes mellitus with diabetic chronic kidney disease: Secondary | ICD-10-CM | POA: Diagnosis not present

## 2021-11-25 DIAGNOSIS — N281 Cyst of kidney, acquired: Secondary | ICD-10-CM | POA: Diagnosis not present

## 2021-11-25 DIAGNOSIS — D649 Anemia, unspecified: Secondary | ICD-10-CM | POA: Diagnosis not present

## 2021-11-25 DIAGNOSIS — E785 Hyperlipidemia, unspecified: Secondary | ICD-10-CM | POA: Diagnosis not present

## 2021-11-25 DIAGNOSIS — I5022 Chronic systolic (congestive) heart failure: Secondary | ICD-10-CM | POA: Diagnosis not present

## 2021-11-25 DIAGNOSIS — N1832 Chronic kidney disease, stage 3b: Secondary | ICD-10-CM | POA: Diagnosis not present

## 2021-11-25 DIAGNOSIS — I129 Hypertensive chronic kidney disease with stage 1 through stage 4 chronic kidney disease, or unspecified chronic kidney disease: Secondary | ICD-10-CM | POA: Diagnosis not present

## 2021-11-25 DIAGNOSIS — N179 Acute kidney failure, unspecified: Secondary | ICD-10-CM | POA: Diagnosis not present

## 2021-11-29 DIAGNOSIS — E119 Type 2 diabetes mellitus without complications: Secondary | ICD-10-CM | POA: Diagnosis not present

## 2021-11-29 DIAGNOSIS — N1832 Chronic kidney disease, stage 3b: Secondary | ICD-10-CM | POA: Diagnosis not present

## 2021-12-02 DIAGNOSIS — E538 Deficiency of other specified B group vitamins: Secondary | ICD-10-CM | POA: Diagnosis not present

## 2021-12-09 DIAGNOSIS — E538 Deficiency of other specified B group vitamins: Secondary | ICD-10-CM | POA: Diagnosis not present

## 2021-12-14 DIAGNOSIS — H524 Presbyopia: Secondary | ICD-10-CM | POA: Diagnosis not present

## 2021-12-14 DIAGNOSIS — E119 Type 2 diabetes mellitus without complications: Secondary | ICD-10-CM | POA: Diagnosis not present

## 2021-12-14 DIAGNOSIS — H52203 Unspecified astigmatism, bilateral: Secondary | ICD-10-CM | POA: Diagnosis not present

## 2021-12-14 DIAGNOSIS — Z961 Presence of intraocular lens: Secondary | ICD-10-CM | POA: Diagnosis not present

## 2021-12-21 DIAGNOSIS — I480 Paroxysmal atrial fibrillation: Secondary | ICD-10-CM | POA: Diagnosis not present

## 2021-12-21 DIAGNOSIS — E782 Mixed hyperlipidemia: Secondary | ICD-10-CM | POA: Diagnosis not present

## 2021-12-21 DIAGNOSIS — I10 Essential (primary) hypertension: Secondary | ICD-10-CM | POA: Diagnosis not present

## 2021-12-21 DIAGNOSIS — N1832 Chronic kidney disease, stage 3b: Secondary | ICD-10-CM | POA: Diagnosis not present

## 2021-12-21 DIAGNOSIS — E1122 Type 2 diabetes mellitus with diabetic chronic kidney disease: Secondary | ICD-10-CM | POA: Diagnosis not present

## 2021-12-28 ENCOUNTER — Ambulatory Visit (INDEPENDENT_AMBULATORY_CARE_PROVIDER_SITE_OTHER): Payer: Medicare Other | Admitting: Psychiatry

## 2021-12-28 ENCOUNTER — Encounter: Payer: Self-pay | Admitting: *Deleted

## 2021-12-28 VITALS — BP 124/49 | HR 63 | Ht 69.0 in | Wt 193.4 lb

## 2021-12-28 DIAGNOSIS — R413 Other amnesia: Secondary | ICD-10-CM | POA: Diagnosis not present

## 2021-12-28 MED ORDER — LORAZEPAM 0.5 MG PO TABS: ORAL_TABLET | ORAL | 0 refills | Status: DC

## 2021-12-28 NOTE — Patient Instructions (Addendum)
MRI brain  Referral to neuropsychology   Tasks to improve attention/working memory 1. Good sleep hygiene (7-8 hrs of sleep) 2. Learning a new skill (Painting, Carpentry, Pottery, new language, Knitting). 3.Cognitive exercises (keep a daily journal, Puzzles) 4. Physical exercise and training  (30 min/day X 4 days week) 5. Being on Antidepressant if needed 6.Yoga, Meditation, Tai Chi 7. Decrease alcohol intake 8.Have a clear schedule and structure in daily routine  MIND Diet: The Mediterranean-DASH Diet Intervention for Neurodegenerative Delay, or MIND diet, targets the health of the aging brain. Research participants with the highest MIND diet scores had a significantly slower rate of cognitive decline compared with those with the lowest scores. The effects of the MIND diet on cognition showed greater effects than either the Mediterranean or the DASH diet alone.  The healthy items the MIND diet guidelines suggest include:  3+ servings a day of whole grains 1+ servings a day of vegetables (other than green leafy) 6+ servings a week of green leafy vegetables 5+ servings a week of nuts 4+ meals a week of beans 2+ servings a week of berries 2+ meals a week of poultry 1+ meals a week of fish Mainly olive oil if added fat is used  The unhealthy items, which are higher in saturated and trans fat, include: Less than 5 servings a week of pastries and sweets Less than 4 servings a week of red meat (including beef, pork, lamb, and products made from these meats) Less than one serving a week of cheese and fried foods Less than 1 tablespoon a day of butter/stick margarine  

## 2021-12-28 NOTE — Progress Notes (Signed)
[facility]  PATIENT: [name] DOB: Dec 21, 1935  REFERRING CLINICIAN: Nena Jordan,* HISTORY FROM: self, wife REASON FOR VISIT: memory loss   HISTORICAL  CHIEF COMPLAINT:  Chief Complaint  Patient presents with   New Patient (Initial Visit)    Pt is well. His wife states that she noticed about 2 months he became disoriented. Room 1 with wife.    HISTORY OF PRESENT ILLNESS:  The patient presents for evaluation of memory loss which began about 2 years ago but significantly worsened in the past 2 months.  Around that time he had a week where he was very disoriented. Got lost while driving multiple times and could not remember conversations from earlier that day.  He was found to have a B12 deficiency and started on B12 injections. Notes that he eats chicken but does not eat any red meat. After the second shot he felt like his memory had improved significantly. Felt like "a cloud had been lifted".   While acute disorientation has improved, wife is still concerned about persistent short term memory difficulties. Forgets conversations he's had and appointments he has scheduled. Struggles to remember names including the names of family members. Will forget the name of restaurants he's been to, or say he has never been to a place that he has visited previously. Wife has started to manage his medications and appointments. She has also taken over some of the finances.  MOCA on 11/2021 was 25/30  TBI:  No past history of TBI Stroke:  no past history of stroke Seizures:  no past history of seizures Sleep: He has sleep apnea and does not use his CPAP Mood: Feels more irritable, wife says he is more short tempered and seems more childlike  Functional status:  Patient lives with his wife Cooking: Wife does the cooking Cleaning: makes the bed and cleans up around the house Driving: Rarely drives now, denies issues with getting lost since restarting  B12 Bills: Wife has had to help with some of the finances Medications: Wife has been managing his medications Forget how to use items around the house?: no Getting lost going to familiar places?: yes Forgetting loved ones names?: forgets acquaintances Word finding difficulty? yes  OTHER MEDICAL CONDITIONS: CAD s/p stent, HTN, pAf on eliquis, CHF, renal artery stenosis, OSA on CPAP, DM2, CKD, HLD, spinal cord ependymoma, B12 deficiency    REVIEW OF SYSTEMS: Full 14 system review of systems performed and negative with exception of: memory loss  ALLERGIES: Allergies  Allergen Reactions   Betadine [Povidone Iodine] Anaphylaxis   Contrast Media [Iodinated Contrast Media] Anaphylaxis and Rash    Other Reaction: Other reaction   Iodine Anaphylaxis    Shellfish, IVP dye. Swelling in throat and foaming at mouth. Other reaction(s): foam at mouth   Shellfish Allergy Anaphylaxis   Spironolactone Other (See Comments)     Hyperkalemia    Gabapentin Other (See Comments)    Other reaction(s): dizzy, fainted    HOME MEDICATIONS: Outpatient Medications Prior to Visit  Medication Sig Dispense Refill   apixaban (ELIQUIS) 2.5 MG TABS tablet Take 1 tablet (2.5 mg total) by mouth 2 (two) times daily. 180 tablet 2   chlorthalidone (HYGROTON) 25 MG tablet Take 12.5 mg by mouth daily.     Cholecalciferol (VITAMIN D3) 50 MCG (2000 UT) CAPS Take 2,000 Units by mouth daily.     clopidogrel (PLAVIX) 75 MG tablet TAKE 1 TABLET BY MOUTH EVERY DAY 90 tablet 2   escitalopram (  LEXAPRO) 20 MG tablet Take 20 mg by mouth daily.     ketoconazole (NIZORAL) 2 % cream Apply 1 application. topically 2 (two) times daily.     nitroGLYCERIN (NITROSTAT) 0.4 MG SL tablet Place 1 tablet (0.4 mg total) under the tongue every 5 (five) minutes as needed for chest pain. 25 tablet 2   olmesartan-hydrochlorothiazide (BENICAR HCT) 40-12.5 MG tablet Take 1 tablet by mouth daily.     ONETOUCH ULTRA test strip USE TO TEST BLOOD SUGAR  ONCE OR TWICE A DAY (E11.22)     oxybutynin (DITROPAN XL) 15 MG 24 hr tablet Take 1 tablet (15 mg total) by mouth at bedtime. 30 tablet 1   rosuvastatin (CRESTOR) 20 MG tablet Take 20 mg by mouth every evening.     tamsulosin (FLOMAX) 0.4 MG CAPS capsule Take 1 capsule (0.4 mg total) by mouth at bedtime. 30 capsule 1   carvedilol (COREG) 6.25 MG tablet Take 12.5 mg by mouth 2 (two) times daily. (Patient not taking: Reported on 12/28/2021)     metFORMIN (GLUCOPHAGE) 500 MG tablet Take 1 tablet (500 mg total) by mouth 2 (two) times daily with a meal. (Patient not taking: Reported on 12/28/2021)     telmisartan (MICARDIS) 80 MG tablet Take 80 mg by mouth daily. (Patient not taking: Reported on 07/13/2021)     No facility-administered medications prior to visit.    PAST MEDICAL HISTORY: Past Medical History:  Diagnosis Date   Arthritis    Atrial fibrillation ([place])    CAD S/P percutaneous coronary angioplasty 08/2004   a) CARDIOLOGIST-  DR DAVID HARDING; DES to LCx. X2  - Cypher 2.5 mm postdilated to 2.75 mm (20 mm and 13 mm stents);; Myoview 07/2020 ++ -> Cath with diffuse 90-100% CTO of p-dRCA (L-R collaterals), mid LAD 75 (SYNERGY DES 2.5 X 20 - 3.1>2.8), Mid-Distal LCx 65% & 70% prior to patent stent --> DES PCI (overlaps old) - SYNERGY 2.75 X 38 -> 3.1 mm)   Cardiac arrhythmia    Chronic kidney disease    Colon polyps 05/10/2005   Hyperplastic polyps   Complication of anesthesia    "takes long time to wake up"   Depression    Diabetes mellitus type 2 with complications (HCC)    neuropathy; CAD borderline- no med   Diastolic dysfunction without heart failure    Moderate LVH, Gr 1 DD on Echo 2011; no CHF admissions, no tt on diuretisc   Essential hypertension    H/O: gout STABLE   History of melanoma excision 2010   FOREHEAD   History of prostate cancer 2004--  S/P SEED IMPLANTS    NO RECURRENCE   Hydrocele, left    Hyperlipidemia LDL goal <70    Hypertension    Impaired hearing RIGHT  HEARING AID   Melanoma ([place])    Memory changes    OSA on CPAP    bipap   PAF (paroxysmal atrial fibrillation) ([place]) 12/11/2017   Admitted with Afib RVR - converted on IV Diltiazem. d/c on Eliquis; CHA2DS2Vasc = 5.   Prostate cancer [facility])    prostate , melanoma head   Shingles 2007   Syncope and collapse 06/2015   Likely related to post micturition, Neurontin and dehydration with orthostatic hypotension   Urgency of urination     PAST SURGICAL HISTORY: Past Surgical History:  Procedure Laterality Date   Cardiac Event Monitor  3-07/2015   Mostly SR - 58-132 bpm. Rare PVCs (occ bigeminy), Frequent PACs - singlets, couplets -  short runs of PAT.   circumsision     CORONARY ANGIOPLASTY WITH STENT PLACEMENT  09/01/2004   Cypher DES x 2 d-m LCx 2.5 mm x 20 mm & 2.5 mm x 13 mm (~2.75 mm)   CORONARY STENT INTERVENTION N/A 08/04/2020   Procedure: CORONARY STENT INTERVENTION;  Surgeon: Leonie Man, MD;  Location: [facility];  Service: Cardiovascular;; STAGED: mid LAD 75% (SYNERGY DES 2.5 X 20 --> 3.1-28 mm post-dilation; Mid LCx 65% - distal 70% prior to old stents => DES PCI (overlapps old stent & crossing OM1) SYNERGY DES 2.75 X 38 -> 3.1 mm)   EXCISION MELANOMA FROM FOREHEAD  2010   EYE SURGERY Bilateral    cataracts   HYDROCELE EXCISION  10/14/2011   Procedure: HYDROCELECTOMY ADULT;  Surgeon: Ailene Rud, MD;  Location: [facility];  Service: Urology;  Laterality: Left;   LAMINECTOMY N/A 07/13/2012   Procedure: THORACIC LAMINECTOMY FOR RESECTION OF INTRAMEDULLARY SPINAL CHORD TUMOR WITH SPINAL CHORD MONITORING;  Surgeon: Erline Levine, MD;  Location: [facility];  Service: Neurosurgery;  Laterality: N/A;  Thoracic laminectomy for resection of intramedullary spinal cord tumor with spinal cord monitering and Dr. Christella Noa to assist   LAMINECTOMY N/A 12/09/2014   Procedure: Redo Thoracic laminectomy with intramedullary tumar resection/USN/[place]/Subarachnoid  shunt/spinal cord monitoring/Dr. Kathyrn Sheriff to assist;  Surgeon: Erline Levine, MD;  Location: [place] [facility];  Service: Neurosurgery;  Laterality: N/A;  Redo Thoracic laminectomy with intramedullary tumar resection/USN/[place]/Subarachnoid shunt/spinal cord monitoring/Dr. Kathyrn Sheriff to assist   LEFT HEART CATH AND CORONARY ANGIOGRAPHY N/A 08/03/2020   Procedure: LEFT HEART CATH AND CORONARY ANGIOGRAPHY;  Surgeon: Leonie Man, MD;  Location: [facility] IN[facility]ASIVE [facility];;Proximal-mid RCA 99% with 90% side branch R VM.  Distal RCA 100%.  Proximal LAD 40%.  Mid LAD 75%.  Proximal LCx 65%.  Proximal-mid LCx 30%.  Mid LCx 70% with widely patent mid to distal LCx stent patent.  Mildly elevated LVEDP. - staged PCI LAD & LCx   NM MYOVIEW LTD  3/212014; June 2016   Both Lexiscan: a. LOW RISK, mild inferior bowel artifact; No ischemia or Infarction; b. Low risk, normal study. No infarction or ischemia. No artifact.   NM MYOVIEW LTD  07/09/2020   Fixed mid to apical anteroseptal perfusion defect with hypokinesia in this area consistent with prior infarct.  EF estimated 50%.  Also fixed inferior perfusion defect and apical defect with normal wall motion consistent with artifact.  Read as low risk, no ischemia, however there is a significant anterior defect not present on previous study.Marland Kitchen   PROSTATE PALLADIUM GOLD SEED IMPLANTS (118)  11/15/2002   PROSTATE CANCER   SKIN BIOPSY     TONSILLECTOMY     TRANSTHORACIC ECHOCARDIOGRAM  12/2017   (A. fib): Moderate concentric LVH.  EF 55 to 60%.  No wall motion normality.  Mild to moderately dilated left atrium.   TRANSTHORACIC ECHOCARDIOGRAM  01/19/2021   EF 40 to 45%.  Apical akinesis-concerning for LAD infarction.  Moderate concentric LVH.  Normal RV function.  (Consistent with known RCA occlusion with LAD disease    FAMILY HISTORY: Family History  Problem Relation Age of Onset   Hypertension Mother    Stroke Mother    Heart disease Mother    Hypertension Father     Prostate cancer Father    Heart disease Father    Hypertension Brother    Prostate cancer Brother    Colon cancer Neg Hx    Esophageal  cancer Neg Hx    Stomach cancer Neg Hx     SOCIAL HISTORY: Social History   Socioeconomic History   Marital status: Married    Spouse name: Not on file   Number of children: 2   Years of education: Not on file   Highest education level: Not on file  Occupational History   Occupation: Sales  Tobacco Use   Smoking status: Never   Smokeless tobacco: Never   Tobacco comments:    occ alcohol  Vaping Use   Vaping Use: Never used  Substance and Sexual Activity   Alcohol use: Not Currently    Comment: OCCASIONAL   Drug use: No   Sexual activity: Not on file  Other Topics Concern   Not on file  Social History Narrative   12/28/21 He is married, father of 2, grandfather of 20.    Does not really get exercise now because of his back issues.    Does not smoke and drinks social alcohol. Works in [employer].   Social Determinants of Health   Financial Resource Strain: Not on file  Food Insecurity: Not on file  Transportation Needs: Not on file  Physical Activity: Not on file  Stress: Not on file  Social Connections: Not on file  Intimate Partner Violence: Not on file     PHYSICAL EXAM  GENERAL EXAM/CONSTITUTIONAL: Vitals:  Vitals:   12/28/21 1248  BP: (!) 124/49  Pulse: 63  Weight: 193 lb 6 oz (87.7 kg)  Height: '5\' 9"'$  (1.753 m)   Body mass index is 28.56 kg/m. Wt Readings from Last 3 Encounters:  12/28/21 193 lb 6 oz (87.7 kg)  07/13/21 175 lb (79.4 kg)  07/07/21 175 lb (79.4 kg)    NEUROLOGIC: MENTAL STATUS:  -Alert, oriented to self, place, and time -Naming intact, language fluent -Mildly disinhibited, makes jokes including sexual references  CRANIAL NERVE:  2nd, 3rd, 4th, 6th - pupils equal and reactive to light, visual fields full to confrontation, extraocular muscles intact, no nystagmus 5th - facial sensation  symmetric 7th - facial strength symmetric 8th - hearing intact 9th - palate elevates symmetrically, uvula midline 11th - shoulder shrug symmetric 12th - tongue protrusion midline  MOTOR:  normal bulk and tone, no cogwheeling, full strength in the BUE, BLE  SENSORY:  normal and symmetric to light touch bilateral upper extremities, has baseline neuropathy with no sensation to light touch or pinprick in bilateral feet  COORDINATION:  finger-nose-finger, fine finger movements normal, no tremor  REFLEXES:  deep tendon reflexes 1+ throughout  GAIT/STATION:  Decreased stride-length, wide based gait. Walks with cane     DIAGNOSTIC DATA (LABS, IMAGING, TESTING) - I reviewed patient records, labs, notes, testing and imaging myself where available.  Lab Results  Component Value Date   WBC 7.4 12/29/2020   HGB 11.8 (L) 12/29/2020   HCT 37.0 (L) 12/29/2020   MCV 93 12/29/2020   PLT 193 12/29/2020      Component Value Date/Time   NA 135 04/07/2021 1229   K 4.5 04/07/2021 1229   CL 101 04/07/2021 1229   CO2 22 04/07/2021 1229   GLUCOSE 80 04/07/2021 1229   GLUCOSE 149 (H) 08/05/2020 0231   BUN 42 (H) 04/07/2021 1229   CREATININE 2.12 (H) 04/07/2021 1229   CREATININE 0.92 04/16/2013 0959   CALCIUM 8.9 04/07/2021 1229   PROT 6.1 04/07/2021 1229   ALBUMIN 4.2 04/07/2021 1229   AST 15 04/07/2021 1229   ALT 9 04/07/2021 1229  ALKPHOS 57 04/07/2021 1229   BILITOT 0.5 04/07/2021 1229   GFRNONAA 32 (L) 08/05/2020 0231   GFRAA >60 12/11/2017 1905   Lab Results  Component Value Date   CHOL 106 08/05/2020   HDL 38 (L) 08/05/2020   LDLCALC 44 08/05/2020   TRIG 121 08/05/2020   CHOLHDL 2.8 08/05/2020   Lab Results  Component Value Date   HGBA1C 6.2 (H) 08/03/2020   Lab Results  Component Value Date   VITAMINB12 227 10/16/2013   Lab Results  Component Value Date   TSH 5.730 (H) 12/29/2020    11/2021: B12 186, TSH 3.39    ASSESSMENT AND PLAN  86 y.o. year old  male with a history of CAD s/p stent, HTN, pAf on eliquis, CHF, renal artery stenosis, OSA on CPAP, DM2, CKD, HLD, spinal cord ependymoma, B12 deficiency who presents for evaluation of memory loss. MOCA last month was 25/30. He has had significant improvement with B12 supplementation. However, his wife remains concerned as he continues to have progressive short term memory difficulty which is now interfering with his ADLs. She has also begun to notice personality changes, and he does appear disinhibited during today's visit. Will order brain MRI and refer for neuropsychological testing to better characterize his deficits and assess for signs of an early neurodegenerative disorder. Counseled patient to wear his CPAP and hearing aids regularly as untreated OSA and hearing loss can contribute to memory loss.   1. Memory loss       PLAN: - MRI brain - Referral for neuropsychological testing - Counseled patient to use CPAP and hearing aids regularly - Recommend he avoid driving when possible. Limit driving to short distances and familiar places  Orders Placed This Encounter  Procedures   [place]   Ambulatory referral to Neuropsychology    Meds ordered this encounter  Medications   LORazepam (ATIVAN) 0.5 MG tablet    Sig: Take 1-2 pills 30 minutes prior to MRI    Dispense:  2 tablet    Refill:  0    Return in about 6 months (around 06/28/2022).  I spent an average of 67 minutes chart reviewing and counseling the patient, with at least 50% of the time face to face with the patient. General brain health measures discussed, including the importance of regular aerobic exercise. Reviewed safety measures including driving safety.   Genia Harold, MD 12/28/21 2:00 PM  [facility] [address], [place], [place] [address] [phone]

## 2021-12-29 ENCOUNTER — Telehealth: Payer: Self-pay | Admitting: Psychiatry

## 2021-12-29 NOTE — Telephone Encounter (Signed)
Psychology referral faxed to [facility], phone # [phone].

## 2021-12-29 NOTE — Telephone Encounter (Signed)
medicare/[employer] NPR sent to [facility]

## 2022-01-10 ENCOUNTER — Telehealth: Payer: Self-pay | Admitting: Psychiatry

## 2022-01-10 DIAGNOSIS — H60391 Other infective otitis externa, right ear: Secondary | ICD-10-CM | POA: Diagnosis not present

## 2022-01-10 NOTE — Telephone Encounter (Signed)
Contacted spouse back, gave her the info to [facility]. She was appreciative.

## 2022-01-10 NOTE — Telephone Encounter (Signed)
Pt wife is calling. Stated she is following up on Pt MRI. She is requesting a return call.

## 2022-01-10 NOTE — Telephone Encounter (Signed)
He is scheduled at [facility] on 10/14 their phone number is [phone]

## 2022-01-11 DIAGNOSIS — Z23 Encounter for immunization: Secondary | ICD-10-CM | POA: Diagnosis not present

## 2022-01-15 ENCOUNTER — Other Ambulatory Visit: Payer: PRIVATE HEALTH INSURANCE

## 2022-01-18 DIAGNOSIS — N281 Cyst of kidney, acquired: Secondary | ICD-10-CM | POA: Diagnosis not present

## 2022-01-18 DIAGNOSIS — D649 Anemia, unspecified: Secondary | ICD-10-CM | POA: Diagnosis not present

## 2022-01-18 DIAGNOSIS — E1122 Type 2 diabetes mellitus with diabetic chronic kidney disease: Secondary | ICD-10-CM | POA: Diagnosis not present

## 2022-01-18 DIAGNOSIS — E785 Hyperlipidemia, unspecified: Secondary | ICD-10-CM | POA: Diagnosis not present

## 2022-01-18 DIAGNOSIS — N179 Acute kidney failure, unspecified: Secondary | ICD-10-CM | POA: Diagnosis not present

## 2022-01-18 DIAGNOSIS — N1832 Chronic kidney disease, stage 3b: Secondary | ICD-10-CM | POA: Diagnosis not present

## 2022-01-18 DIAGNOSIS — I5022 Chronic systolic (congestive) heart failure: Secondary | ICD-10-CM | POA: Diagnosis not present

## 2022-01-18 DIAGNOSIS — I129 Hypertensive chronic kidney disease with stage 1 through stage 4 chronic kidney disease, or unspecified chronic kidney disease: Secondary | ICD-10-CM | POA: Diagnosis not present

## 2022-01-20 DIAGNOSIS — Z23 Encounter for immunization: Secondary | ICD-10-CM | POA: Diagnosis not present

## 2022-01-24 DIAGNOSIS — N1832 Chronic kidney disease, stage 3b: Secondary | ICD-10-CM | POA: Diagnosis not present

## 2022-01-24 DIAGNOSIS — I480 Paroxysmal atrial fibrillation: Secondary | ICD-10-CM | POA: Diagnosis not present

## 2022-01-24 DIAGNOSIS — I10 Essential (primary) hypertension: Secondary | ICD-10-CM | POA: Diagnosis not present

## 2022-01-24 DIAGNOSIS — E782 Mixed hyperlipidemia: Secondary | ICD-10-CM | POA: Diagnosis not present

## 2022-01-24 DIAGNOSIS — E1122 Type 2 diabetes mellitus with diabetic chronic kidney disease: Secondary | ICD-10-CM | POA: Diagnosis not present

## 2022-01-26 ENCOUNTER — Ambulatory Visit
Admission: RE | Admit: 2022-01-26 | Discharge: 2022-01-26 | Disposition: A | Discharge status: Home / Self Care | Payer: Medicare Other | Source: Ambulatory Visit | Attending: Psychiatry | Admitting: Psychiatry

## 2022-01-26 DIAGNOSIS — R413 Other amnesia: Secondary | ICD-10-CM

## 2022-01-27 ENCOUNTER — Other Ambulatory Visit: Payer: PRIVATE HEALTH INSURANCE

## 2022-02-21 ENCOUNTER — Telehealth: Payer: Self-pay | Admitting: Psychiatry

## 2022-02-21 MED ORDER — DONEPEZIL HCL 5 MG PO TABS: 5.0000 mg | ORAL_TABLET | Freq: Every day | ORAL | 6 refills | Status: DC

## 2022-02-21 NOTE — Telephone Encounter (Signed)
Called the wife back. He completed the MRI and she received the mychart result note but states that no one really followed up on this. She feels that since his visit there has been decline.She would like to discuss if a medication could be started to help slow down progression. Per the notes it looks like Dr Billey Gosling had referred him to neuro psych testing. I advised that likely she was waiting for that testing to be completed. I asked if they had received a call from [facility], (phone # [phone].) She states they have not heard anything. I advised I would reach out to our referrals team to check on this and provided her the phone number to the place as well. She wanted to ask if Dr Billey Gosling would be willing to initiate something or recommend something to help with slowing down the cognitive decline.

## 2022-02-21 NOTE — Telephone Encounter (Signed)
[name], Diontay Rosencrans (on [place]) pt's memory loss gradually getting worse. Do not remember every day things. Would like a call if there is a sooner appt available. Please call wife's phone number  [phone]

## 2022-02-21 NOTE — Telephone Encounter (Signed)
Called the wife back and advised that Dr Billey Gosling would highly recommend the neuro psych testing. She was not opposed to the patient starting low dose aricept in the meantime. Advised the wife she called that into the pharmacy for him. Informed that its best to take with food and most common side effect is upset stomach or diarrhea. She verbalized understanding and was appreciative for the call back.

## 2022-02-21 NOTE — Telephone Encounter (Signed)
Given his normal memory testing in the office and unremarkable MRI, I can't really diagnose him with dementia until he has the neuro-psych testing. We can start low dose donepezil before our office visit if they want to try it. I'll send a prescription to his pharmacy. The most common side effect is upset stomach, so I recommend he take it with breakfast or lunch.

## 2022-02-22 NOTE — Telephone Encounter (Signed)
He is scheduled at [facility] for 05/10/22 and a follow-up on 05/26/22 with Dr. Cecelia Byars.

## 2022-02-22 NOTE — Telephone Encounter (Signed)
Resent referral to [facility], phone # [phone].

## 2022-03-18 DIAGNOSIS — E1122 Type 2 diabetes mellitus with diabetic chronic kidney disease: Secondary | ICD-10-CM | POA: Diagnosis not present

## 2022-03-18 DIAGNOSIS — I5022 Chronic systolic (congestive) heart failure: Secondary | ICD-10-CM | POA: Diagnosis not present

## 2022-03-18 DIAGNOSIS — I2510 Atherosclerotic heart disease of native coronary artery without angina pectoris: Secondary | ICD-10-CM | POA: Diagnosis not present

## 2022-03-18 DIAGNOSIS — N1832 Chronic kidney disease, stage 3b: Secondary | ICD-10-CM | POA: Diagnosis not present

## 2022-03-18 DIAGNOSIS — E782 Mixed hyperlipidemia: Secondary | ICD-10-CM | POA: Diagnosis not present

## 2022-03-18 DIAGNOSIS — F320 Major depressive disorder, single episode, mild: Secondary | ICD-10-CM | POA: Diagnosis not present

## 2022-03-18 DIAGNOSIS — I480 Paroxysmal atrial fibrillation: Secondary | ICD-10-CM | POA: Diagnosis not present

## 2022-03-18 DIAGNOSIS — I10 Essential (primary) hypertension: Secondary | ICD-10-CM | POA: Diagnosis not present

## 2022-03-21 NOTE — Progress Notes (Unsigned)
Cardiology Clinic Note   Patient Name: [name] Date of Encounter: 03/29/2022  Primary Care Provider:  Lavone Orn, MD Primary Cardiologist:  Glenetta Hew, MD  Patient Profile    [name] 86 year old male presents the clinic today for follow-up evaluation of his chronic combined systolic and diastolic CHF, PAF, and coronary artery disease.  Past Medical History    Past Medical History:  Diagnosis Date   Arthritis    Atrial fibrillation ([place])    CAD S/P percutaneous coronary angioplasty 08/2004   a) CARDIOLOGIST-  DR DAVID HARDING; DES to LCx. X2  - Cypher 2.5 mm postdilated to 2.75 mm (20 mm and 13 mm stents);; Myoview 07/2020 ++ -> Cath with diffuse 90-100% CTO of p-dRCA (L-R collaterals), mid LAD 75 (SYNERGY DES 2.5 X 20 - 3.1>2.8), Mid-Distal LCx 65% & 70% prior to patent stent --> DES PCI (overlaps old) - SYNERGY 2.75 X 38 -> 3.1 mm)   Cardiac arrhythmia    Chronic kidney disease    Colon polyps 05/10/2005   Hyperplastic polyps   Complication of anesthesia    "takes long time to wake up"   Depression    Diabetes mellitus type 2 with complications (HCC)    neuropathy; CAD borderline- no med   Diastolic dysfunction without heart failure    Moderate LVH, Gr 1 DD on Echo 2011; no CHF admissions, no tt on diuretisc   Essential hypertension    H/O: gout STABLE   History of melanoma excision 2010   FOREHEAD   History of prostate cancer 2004--  S/P SEED IMPLANTS    NO RECURRENCE   Hydrocele, left    Hyperlipidemia LDL goal <70    Hypertension    Impaired hearing RIGHT HEARING AID   Melanoma ([place])    Memory changes    OSA on CPAP    bipap   PAF (paroxysmal atrial fibrillation) ([place]) 12/11/2017   Admitted with Afib RVR - converted on IV Diltiazem. d/c on Eliquis; CHA2DS2Vasc = 5.   Prostate cancer [facility])    prostate , melanoma head   Shingles 2007   Syncope and collapse 06/2015   Likely related to post micturition, Neurontin and dehydration with  orthostatic hypotension   Urgency of urination    Past Surgical History:  Procedure Laterality Date   Cardiac Event Monitor  3-07/2015   Mostly SR - 58-132 bpm. Rare PVCs (occ bigeminy), Frequent PACs - singlets, couplets - short runs of PAT.   circumsision     CORONARY ANGIOPLASTY WITH STENT PLACEMENT  09/01/2004   Cypher DES x 2 d-m LCx 2.5 mm x 20 mm & 2.5 mm x 13 mm (~2.75 mm)   CORONARY STENT INTERVENTION N/A 08/04/2020   Procedure: CORONARY STENT INTERVENTION;  Surgeon: Leonie Man, MD;  Location: [facility];  Service: Cardiovascular;; STAGED: mid LAD 75% (SYNERGY DES 2.5 X 20 --> 3.1-28 mm post-dilation; Mid LCx 65% - distal 70% prior to old stents => DES PCI (overlapps old stent & crossing OM1) SYNERGY DES 2.75 X 38 -> 3.1 mm)   EXCISION MELANOMA FROM FOREHEAD  2010   EYE SURGERY Bilateral    cataracts   HYDROCELE EXCISION  10/14/2011   Procedure: HYDROCELECTOMY ADULT;  Surgeon: Ailene Rud, MD;  Location: [facility];  Service: Urology;  Laterality: Left;   LAMINECTOMY N/A 07/13/2012   Procedure: THORACIC LAMINECTOMY FOR RESECTION OF INTRAMEDULLARY SPINAL CHORD TUMOR WITH SPINAL CHORD MONITORING;  Surgeon: Erline Levine,  MD;  Location: [facility];  Service: Neurosurgery;  Laterality: N/A;  Thoracic laminectomy for resection of intramedullary spinal cord tumor with spinal cord monitering and Dr. Christella Noa to assist   LAMINECTOMY N/A 12/09/2014   Procedure: Redo Thoracic laminectomy with intramedullary tumar resection/USN/[place]/Subarachnoid shunt/spinal cord monitoring/Dr. Kathyrn Sheriff to assist;  Surgeon: Erline Levine, MD;  Location: [place] [facility];  Service: Neurosurgery;  Laterality: N/A;  Redo Thoracic laminectomy with intramedullary tumar resection/USN/[place]/Subarachnoid shunt/spinal cord monitoring/Dr. Kathyrn Sheriff to assist   LEFT HEART CATH AND CORONARY ANGIOGRAPHY N/A 08/03/2020   Procedure: LEFT HEART CATH AND CORONARY ANGIOGRAPHY;  Surgeon: Leonie Man, MD;  Location: [facility] INVASIVE CV LAB;;Proximal-mid RCA 99% with 90% side branch R VM.  Distal RCA 100%.  Proximal LAD 40%.  Mid LAD 75%.  Proximal LCx 65%.  Proximal-mid LCx 30%.  Mid LCx 70% with widely patent mid to distal LCx stent patent.  Mildly elevated LVEDP. - staged PCI LAD & LCx   NM MYOVIEW LTD  3/212014; June 2016   Both Lexiscan: a. LOW RISK, mild inferior bowel artifact; No ischemia or Infarction; b. Low risk, normal study. No infarction or ischemia. No artifact.   NM MYOVIEW LTD  07/09/2020   Fixed mid to apical anteroseptal perfusion defect with hypokinesia in this area consistent with prior infarct.  EF estimated 50%.  Also fixed inferior perfusion defect and apical defect with normal wall motion consistent with artifact.  Read as low risk, no ischemia, however there is a significant anterior defect not present on previous study.Marland Kitchen   PROSTATE PALLADIUM GOLD SEED IMPLANTS (118)  11/15/2002   PROSTATE CANCER   SKIN BIOPSY     TONSILLECTOMY     TRANSTHORACIC ECHOCARDIOGRAM  12/2017   (A. fib): Moderate concentric LVH.  EF 55 to 60%.  No wall motion normality.  Mild to moderately dilated left atrium.   TRANSTHORACIC ECHOCARDIOGRAM  01/19/2021   EF 40 to 45%.  Apical akinesis-concerning for LAD infarction.  Moderate concentric LVH.  Normal RV function.  (Consistent with known RCA occlusion with LAD disease    Allergies  Allergies  Allergen Reactions   Betadine [Povidone Iodine] Anaphylaxis   Contrast Media [Iodinated Contrast Media] Anaphylaxis and Rash    Other Reaction: Other reaction   Iodine Anaphylaxis    Shellfish, IVP dye. Swelling in throat and foaming at mouth. Other reaction(s): foam at mouth   Shellfish Allergy Anaphylaxis   Spironolactone Other (See Comments)     Hyperkalemia    Gabapentin Other (See Comments)    Other reaction(s): dizzy, fainted    History of Present Illness    [name] has a PMH of coronary artery disease status post PCI with  DES x 2 to his circumflex 2006, additional DES to circumflex stenting to his LAD 4/22, paroxysmal atrial fibrillation CHA2DS2-VASc score 6 on apixaban, hyperlipidemia, chronic systolic and diastolic CHF EF 31-54% (TTE 01/19/2021), CKD stage IIIb, chronic fatigue and aortic atherosclerosis.  He was seen in follow-up by Dr. Ellyn Hack on 07/07/2021.  During that time he reported feeling lethargic and weak.  He indicated he had several falls.  He was more depressed.  He denied chest pain and pressure.  He had lost weight due to reduced calorie diet.  He denied symptoms of heart failure and was only noted to have trivial lower extremity edema.  He denied palpitations.  He was somewhat limited in his physical activity due to his back pain and leg weakness.  His olmesartan/HCTZ was discontinued and his  carvedilol was uptitrated from 3.125 to 12.5 mg twice daily.  He presents to the clinic today for follow-up evaluation and states he continues to have fatigue.  He presents with his wife.  He reports that he is able to gamble and [place] for several hours and do fine with plain craps.  His wife reports dietary indiscretion.  We reviewed the importance of increased physical activity and low-salt diet.  They expressed understanding.  She also reports that he has increasing dimension and is now on Aricept.  He is now following with neurology as well and has plans for further diagnostics.  He reports compliance with this medication and denies side effects.  I will continue his current medication regimen, give the salty 6 diet sheet and plan follow-up in 6 months.  Today he denies chest pain, shortness of breath, lower extremity edema,  palpitations, melena, hematuria, hemoptysis, diaphoresis, weakness, presyncope, syncope, orthopnea, and PND.    Home Medications    Prior to Admission medications   Medication Sig Start Date End Date Taking? Authorizing Provider  apixaban (ELIQUIS) 2.5 MG TABS tablet Take 1 tablet (2.5  mg total) by mouth 2 (two) times daily. 08/05/20   Margie Billet, NP  carvedilol (COREG) 6.25 MG tablet Take 12.5 mg by mouth 2 (two) times daily. Patient not taking: Reported on 12/28/2021 06/13/21   [provider]  chlorthalidone (HYGROTON) 25 MG tablet Take 12.5 mg by mouth daily.    [provider]  Cholecalciferol (VITAMIN D3) 50 MCG (2000 UT) CAPS Take 2,000 Units by mouth daily.    [provider]  clopidogrel (PLAVIX) 75 MG tablet TAKE 1 TABLET BY MOUTH EVERY DAY 06/02/21   Leonie Man, MD  donepezil (ARICEPT) 5 MG tablet Take 1 tablet (5 mg total) by mouth daily. 02/21/22   Genia Harold, MD  escitalopram (LEXAPRO) 20 MG tablet Take 20 mg by mouth daily. 09/29/20   [provider]  ketoconazole (NIZORAL) 2 % cream Apply 1 application. topically 2 (two) times daily. 06/03/21   [provider]  LORazepam (ATIVAN) 0.5 MG tablet Take 1-2 pills 30 minutes prior to MRI 12/28/21   Genia Harold, MD  metFORMIN (GLUCOPHAGE) 500 MG tablet Take 1 tablet (500 mg total) by mouth 2 (two) times daily with a meal. Patient not taking: Reported on 12/28/2021 12/12/17   Isaiah Serge, NP  nitroGLYCERIN (NITROSTAT) 0.4 MG SL tablet Place 1 tablet (0.4 mg total) under the tongue every 5 (five) minutes as needed for chest pain. 02/02/21 02/02/22  Leonie Man, MD  olmesartan-hydrochlorothiazide (BENICAR HCT) 40-12.5 MG tablet Take 1 tablet by mouth daily. 10/04/21   [provider]  ONETOUCH ULTRA test strip USE TO TEST BLOOD SUGAR ONCE OR TWICE A DAY (E11.22) 06/27/19   [provider]  oxybutynin (DITROPAN XL) 15 MG 24 hr tablet Take 1 tablet (15 mg total) by mouth at bedtime. 12/25/14   Angiulli, Lavon Paganini, PA-C  rosuvastatin (CRESTOR) 20 MG tablet Take 20 mg by mouth every evening.    [provider]  tamsulosin (FLOMAX) 0.4 MG CAPS capsule Take 1 capsule (0.4 mg total) by mouth at bedtime. 12/25/14   Angiulli, Lavon Paganini, PA-C  telmisartan  (MICARDIS) 80 MG tablet Take 80 mg by mouth daily. Patient not taking: Reported on 07/13/2021    [provider]    Family History    Family History  Problem Relation Age of Onset   Hypertension Mother    Stroke Mother  Heart disease Mother    Hypertension Father    Prostate cancer Father    Heart disease Father    Hypertension Brother    Prostate cancer Brother    Colon cancer Neg Hx    Esophageal cancer Neg Hx    Stomach cancer Neg Hx    He indicated that his mother is deceased. He indicated that his father is deceased. He indicated that both of his sisters are alive. He indicated that all of his three brothers are alive. He indicated that both of his children are alive. He indicated that the status of his neg hx is unknown.  Social History    Social History   Socioeconomic History   Marital status: Married    Spouse name: Not on file   Number of children: 2   Years of education: Not on file   Highest education level: Not on file  Occupational History   Occupation: Sales  Tobacco Use   Smoking status: Never   Smokeless tobacco: Never   Tobacco comments:    occ alcohol  Vaping Use   Vaping Use: Never used  Substance and Sexual Activity   Alcohol use: Not Currently    Comment: OCCASIONAL   Drug use: No   Sexual activity: Not on file  Other Topics Concern   Not on file  Social History Narrative   12/28/21 He is married, father of 2, grandfather of 2.    Does not really get exercise now because of his back issues.    Does not smoke and drinks social alcohol. Works in [employer].   Social Determinants of Health   Financial Resource Strain: Not on file  Food Insecurity: Not on file  Transportation Needs: Not on file  Physical Activity: Not on file  Stress: Not on file  Social Connections: Not on file  Intimate Partner Violence: Not on file     Review of Systems    General:  No chills, fever, night sweats or weight changes.  Cardiovascular:   No chest pain, dyspnea on exertion, edema, orthopnea, palpitations, paroxysmal nocturnal dyspnea. Dermatological: No rash, lesions/masses Respiratory: No cough, dyspnea Urologic: No hematuria, dysuria Abdominal:   No nausea, vomiting, diarrhea, bright red blood per rectum, melena, or hematemesis Neurologic:  No visual changes, wkns, changes in mental status. All other systems reviewed and are otherwise negative except as noted above.  Physical Exam    VS:  BP 138/87 (BP Location: Left Arm, Patient Position: Sitting, Cuff Size: Normal)   Pulse 66   Ht '5\' 9"'$  (1.753 m)   Wt 200 lb 6.4 oz (90.9 kg)   SpO2 98%   BMI 29.59 kg/m  , BMI Body mass index is 29.59 kg/m. GEN: Well nourished, well developed, in no acute distress. HEENT: normal. Neck: Supple, no JVD, carotid bruits, or masses. Cardiac: RRR, no murmurs, rubs, or gallops. No clubbing, cyanosis, edema.  Radials/DP/PT 2+ and equal bilaterally.  Respiratory:  Respirations regular and unlabored, clear to auscultation bilaterally. GI: Soft, nontender, nondistended, BS + x 4. MS: no deformity or atrophy. Skin: warm and dry, no rash. Neuro:  Strength and sensation are intact. Psych: Normal affect.  Accessory Clinical Findings    Recent Labs: 04/07/2021: ALT 9; BUN 42; Creatinine, Ser 2.12; Potassium 4.5; Sodium 135   Recent Lipid Panel    Component Value Date/Time   CHOL 106 08/05/2020 0949   CHOL 145 10/02/2012 1002   TRIG 121 08/05/2020 0949   TRIG 157 (H) 10/02/2012 1002  HDL 38 (L) 08/05/2020 0949   HDL 36 (L) 10/02/2012 1002   CHOLHDL 2.8 08/05/2020 0949   VLDL 24 08/05/2020 0949   LDLCALC 44 08/05/2020 0949   LDLCALC 78 10/02/2012 1002         ECG personally reviewed by me today-none today.  Cardiac catheterization 08/04/2020  Prox Cx lesion is 65% stenosed. Prox Cx to Mid Cx lesion is 30% stenosed. Mid Cx lesion is 75% stenosed. Non-stenotic Mid Cx to Dist Cx stent was previously treated. A drug-eluting stent  was successfully placed (covering all 3 lesions and crossing OM1, overlapping previous stent) using a SYNERGY XD 2.75X38. Deployed to 3.0 mm. Post intervention, there is a 5% residual stenosis in the AV groove ostium. Otherwise 0% residual stenosis throughout including at the overlap site. -------------------------- Prox LAD lesion is 40% stenosed. Mid LAD lesion is 75% stenosed. A drug-eluting stent was successfully placed using a SYNERGY XD 2.50X20. Postdilated to 3.1 mm. Post intervention, there is a 0% residual stenosis.   SUMMARY Successful 2-vessel PCI: Mid to distal LCx lesions treated with a single Synergy DES 2.7 mm x 38 mm postdilated to 3.0 mm.  (Overlaps previous stent, and crosses OM1 proximally) Mid LAD lesion treated with Synergy DES 2.69m x 20 mm (postdilated tapered from 3.1 to 2.8 mm) Moderately elevated LVEDP of 19 mmHg.   RECOMMENDATIONS 8 HR post-cath hydration - but will give 1 dose Lasix 40 mg x 1 given LVEDP 19 mmHg Recheck BMP ~1600 - consider potential d/c in PM   F/u with me when he returns from his trip.       DGlenetta Hew MD  Diagnostic Dominance: Right  Intervention       Echocardiogram 01/19/2021  IMPRESSIONS     1. Apical segments are akinetic including the apex. Findings concerning  for LAD infarction. No LV thrombus on contrast imaging. Left ventricular  ejection fraction, by estimation, is 40 to 45%. The left ventricle has  mildly decreased function. The left  ventricle demonstrates regional wall motion abnormalities (see scoring  diagram/findings for description). There is moderate concentric left  ventricular hypertrophy. Left ventricular diastolic parameters are  consistent with Grade I diastolic dysfunction  (impaired relaxation).   2. Right ventricular systolic function is normal. The right ventricular  size is normal. Tricuspid regurgitation signal is inadequate for assessing  PA pressure.   3. The mitral valve is grossly  normal. Mild mitral valve regurgitation.  No evidence of mitral stenosis.   4. The aortic valve is tricuspid. There is mild calcification of the  aortic valve. Aortic valve regurgitation is trivial. No aortic stenosis is  present.   Comparison(s): Changes from prior study are noted. EF is reduced to  40-45%. New WMA.   FINDINGS   Left Ventricle: Apical segments are akinetic including the apex. Findings  concerning for LAD infarction. No LV thrombus on contrast imaging. Left  ventricular ejection fraction, by estimation, is 40 to 45%. The left  ventricle has mildly decreased  function. The left ventricle demonstrates regional wall motion  abnormalities. Definity contrast agent was given IV to delineate the left  ventricular endocardial borders. The left ventricular internal cavity size  was normal in size. There is moderate  concentric left ventricular hypertrophy. Left ventricular diastolic  parameters are consistent with Grade I diastolic dysfunction (impaired  relaxation).     LV Wall Scoring:  The apical septal segment, apical anterior segment, apical inferior  segment,  and apex are akinetic.   Right Ventricle:  The right ventricular size is normal. No increase in  right ventricular wall thickness. Right ventricular systolic function is  normal. Tricuspid regurgitation signal is inadequate for assessing PA  pressure.   Left Atrium: Left atrial size was normal in size.   Right Atrium: Right atrial size was normal in size.   Pericardium: Trivial pericardial effusion is present.   Mitral Valve: The mitral valve is grossly normal. Mild mitral valve  regurgitation. No evidence of mitral valve stenosis.   Tricuspid Valve: The tricuspid valve is grossly normal. Tricuspid valve  regurgitation is trivial. No evidence of tricuspid stenosis.   Aortic Valve: The aortic valve is tricuspid. There is mild calcification  of the aortic valve. Aortic valve regurgitation is trivial. No  aortic  stenosis is present.   Pulmonic Valve: The pulmonic valve was grossly normal. Pulmonic valve  regurgitation is not visualized. No evidence of pulmonic stenosis.   Aorta: The aortic root and ascending aorta are structurally normal, with  no evidence of dilitation.   Venous: The inferior vena cava was not well visualized.   IAS/Shunts: The atrial septum is grossly normal.   Assessment & Plan   1.  Chronic combined systolic and diastolic CHF-euvolemic today.  Weight stable.  NYHA class II-3.  Echocardiogram 10/22 showed EF of 40-45% Continue carvedilol, chlorthalidone Heart healthy low-sodium diet-salty 6 given Increase physical activity as tolerated   Paroxysmal atrial fibrillation-heart rate today 66.  Denies recent episodes of accelerated or irregular heartbeat.  CHA2DS2-VASc score 6.  Reports compliance with apixaban and denies bleeding issues. Continue apixaban, carvedilol Heart healthy low-sodium diet Increase physical activity as tolerated Avoid triggers caffeine, chocolate, EtOH, dehydration etc.  Coronary artery disease-denies recent episodes of chest pain. Is status post PCI with DES x 2 to his circumflex 2006, additional DES to circumflex stenting to his LAD 4/22. Continue  rosuvastatin, carvedilol Heart healthy low-sodium diet Increase physical activity as tolerated   Hyperlipidemia-LDL 43 3/23 Continue rosuvastatin Heart healthy low-sodium high-fiber diet Increase physical activity as tolerated   Fatigue-stable, chronic.  Some improvement with lower dose of carvedilol. Heart healthy low-sodium diet Increase physical activity as tolerated Continue to monitor  Stage IIIb CKD-creatinine 2.090 01/18/22 Follows with PCP  Disposition: Follow-up with Dr. Ellyn Hack in 6 months.   Jossie Ng. Samantha Olivera NP-C     03/29/2022, 3:30 PM [address] Office [phone] Fax [phone]    I spent 14 minutes  examining this patient, reviewing medications, and using patient centered shared decision making involving her cardiac care.  Prior to her visit I spent greater than 20 minutes reviewing her past medical history,  medications, and prior cardiac tests.

## 2022-03-29 ENCOUNTER — Encounter: Payer: Self-pay | Admitting: General Practice

## 2022-03-29 ENCOUNTER — Ambulatory Visit: Payer: Medicare Other | Attending: General Practice | Admitting: General Practice

## 2022-03-29 VITALS — BP 138/87 | HR 66 | Ht 69.0 in | Wt 200.4 lb

## 2022-03-29 DIAGNOSIS — Z9861 Coronary angioplasty status: Secondary | ICD-10-CM

## 2022-03-29 DIAGNOSIS — I5042 Chronic combined systolic (congestive) and diastolic (congestive) heart failure: Secondary | ICD-10-CM | POA: Diagnosis not present

## 2022-03-29 DIAGNOSIS — I480 Paroxysmal atrial fibrillation: Secondary | ICD-10-CM | POA: Diagnosis not present

## 2022-03-29 DIAGNOSIS — R5382 Chronic fatigue, unspecified: Secondary | ICD-10-CM | POA: Diagnosis not present

## 2022-03-29 DIAGNOSIS — I2510 Atherosclerotic heart disease of native coronary artery without angina pectoris: Secondary | ICD-10-CM

## 2022-03-29 DIAGNOSIS — N1832 Chronic kidney disease, stage 3b: Secondary | ICD-10-CM | POA: Insufficient documentation

## 2022-03-29 DIAGNOSIS — I25119 Atherosclerotic heart disease of native coronary artery with unspecified angina pectoris: Secondary | ICD-10-CM | POA: Diagnosis not present

## 2022-03-29 DIAGNOSIS — E785 Hyperlipidemia, unspecified: Secondary | ICD-10-CM | POA: Insufficient documentation

## 2022-03-29 NOTE — Patient Instructions (Addendum)
Medication Instructions:  The current medical regimen is effective;  continue present plan and medications as directed. Please refer to the Current Medication list given to you today.  *If you need a refill on your cardiac medications before your next appointment, please call your pharmacy*   Lab Work: NONE If you have labs (blood work) drawn today and your tests are completely normal, you will receive your results only by:  [place] (if you have MyChart) OR A paper copy in the mail If you have any lab test that is abnormal or we need to change your treatment, we will call you to review the results.  Other Instructions PLEASE READ AND FOLLOW ATTACHED  SALTY 6   INCREASE PHYSICAL ACTIVITY AS TOLERATED  Follow-Up: At [facility], you and your health needs are our priority.  As part of our continuing mission to provide you with exceptional heart care, we have created designated Provider Care Teams.  These Care Teams include your primary Cardiologist (physician) and Advanced Practice Providers (APPs -  Physician Assistants and Nurse Practitioners) who all work together to provide you with the care you need, when you need it.  Your next appointment:   6 month(s)  The format for your next appointment:   In Person  Provider:   Glenetta Hew, MD     Important Information About Sugar

## 2022-04-07 ENCOUNTER — Telehealth: Payer: Self-pay | Admitting: Cardiology

## 2022-04-07 NOTE — Addendum Note (Signed)
Addended by: Betha Loa F on: 04/07/2022 12:30 PM   Modules accepted: Orders

## 2022-04-07 NOTE — Telephone Encounter (Signed)
Spoke with spouse regarding carvedilol. According to Dr. Allison Quarry notes on 07/07/21, patient was weaned off carvedilol and has not taken any since April 2023. Med list updated. Spouse wanted to update [place] at [facility]. Left message for Phillips Climes at [phone]).

## 2022-04-07 NOTE — Telephone Encounter (Addendum)
Spoke with Phillips Climes at [facility] to inform her patient has not been on carvedilol since April 2023. Phillips Climes then wanted clarification of medications chorthalidone and telmisartan and benicar. Spoke with wife of patient. Patient is not taking chlorthalidone or telmisartan. Patient is taking olmesartan-hydrochlorothiazide 40-12.5 one-half tablet daily. Med list updated. [place] informed. Med lists are congruent now. PharmD [place] advised.

## 2022-04-07 NOTE — Telephone Encounter (Signed)
Phillips Climes returning call.

## 2022-04-07 NOTE — Telephone Encounter (Signed)
Left detailed message for wintana that patient is no longer on carvedilol. She is to call back with questions.

## 2022-04-07 NOTE — Telephone Encounter (Signed)
[name] states they were returning the nurses call. Please advise

## 2022-04-07 NOTE — Telephone Encounter (Signed)
Patient's daughter called and said that the pharmacy asked about the medication carvedilol (COREG) 6.25 MG tablet and wants to know if he knows that the patient isn't taking it and who took him off. Please call daughter back

## 2022-04-08 ENCOUNTER — Encounter (INDEPENDENT_AMBULATORY_CARE_PROVIDER_SITE_OTHER): Payer: Medicare Other | Admitting: Psychiatry

## 2022-04-08 DIAGNOSIS — G309 Alzheimer's disease, unspecified: Secondary | ICD-10-CM | POA: Diagnosis not present

## 2022-04-12 NOTE — Telephone Encounter (Signed)

## 2022-04-21 DIAGNOSIS — E1122 Type 2 diabetes mellitus with diabetic chronic kidney disease: Secondary | ICD-10-CM | POA: Diagnosis not present

## 2022-04-21 DIAGNOSIS — I10 Essential (primary) hypertension: Secondary | ICD-10-CM | POA: Diagnosis not present

## 2022-04-21 DIAGNOSIS — I5022 Chronic systolic (congestive) heart failure: Secondary | ICD-10-CM | POA: Diagnosis not present

## 2022-04-21 DIAGNOSIS — N184 Chronic kidney disease, stage 4 (severe): Secondary | ICD-10-CM | POA: Diagnosis not present

## 2022-04-21 DIAGNOSIS — E782 Mixed hyperlipidemia: Secondary | ICD-10-CM | POA: Diagnosis not present

## 2022-04-26 DIAGNOSIS — D225 Melanocytic nevi of trunk: Secondary | ICD-10-CM | POA: Diagnosis not present

## 2022-04-26 DIAGNOSIS — L814 Other melanin hyperpigmentation: Secondary | ICD-10-CM | POA: Diagnosis not present

## 2022-04-26 DIAGNOSIS — L853 Xerosis cutis: Secondary | ICD-10-CM | POA: Diagnosis not present

## 2022-04-26 DIAGNOSIS — L821 Other seborrheic keratosis: Secondary | ICD-10-CM | POA: Diagnosis not present

## 2022-04-26 DIAGNOSIS — L570 Actinic keratosis: Secondary | ICD-10-CM | POA: Diagnosis not present

## 2022-05-10 DIAGNOSIS — R413 Other amnesia: Secondary | ICD-10-CM | POA: Diagnosis not present

## 2022-05-19 DIAGNOSIS — E782 Mixed hyperlipidemia: Secondary | ICD-10-CM | POA: Diagnosis not present

## 2022-05-19 DIAGNOSIS — E1122 Type 2 diabetes mellitus with diabetic chronic kidney disease: Secondary | ICD-10-CM | POA: Diagnosis not present

## 2022-05-19 DIAGNOSIS — N184 Chronic kidney disease, stage 4 (severe): Secondary | ICD-10-CM | POA: Diagnosis not present

## 2022-05-19 DIAGNOSIS — I10 Essential (primary) hypertension: Secondary | ICD-10-CM | POA: Diagnosis not present

## 2022-05-19 DIAGNOSIS — I480 Paroxysmal atrial fibrillation: Secondary | ICD-10-CM | POA: Diagnosis not present

## 2022-05-19 DIAGNOSIS — I5022 Chronic systolic (congestive) heart failure: Secondary | ICD-10-CM | POA: Diagnosis not present

## 2022-05-26 DIAGNOSIS — R413 Other amnesia: Secondary | ICD-10-CM | POA: Diagnosis not present

## 2022-06-22 DIAGNOSIS — E782 Mixed hyperlipidemia: Secondary | ICD-10-CM | POA: Diagnosis not present

## 2022-06-22 DIAGNOSIS — F320 Major depressive disorder, single episode, mild: Secondary | ICD-10-CM | POA: Diagnosis not present

## 2022-06-22 DIAGNOSIS — I10 Essential (primary) hypertension: Secondary | ICD-10-CM | POA: Diagnosis not present

## 2022-06-22 DIAGNOSIS — N1832 Chronic kidney disease, stage 3b: Secondary | ICD-10-CM | POA: Diagnosis not present

## 2022-06-22 DIAGNOSIS — I480 Paroxysmal atrial fibrillation: Secondary | ICD-10-CM | POA: Diagnosis not present

## 2022-06-22 DIAGNOSIS — I5022 Chronic systolic (congestive) heart failure: Secondary | ICD-10-CM | POA: Diagnosis not present

## 2022-06-22 DIAGNOSIS — E1122 Type 2 diabetes mellitus with diabetic chronic kidney disease: Secondary | ICD-10-CM | POA: Diagnosis not present

## 2022-06-22 DIAGNOSIS — I2510 Atherosclerotic heart disease of native coronary artery without angina pectoris: Secondary | ICD-10-CM | POA: Diagnosis not present

## 2022-07-05 ENCOUNTER — Encounter: Payer: Self-pay | Admitting: Psychiatry

## 2022-07-05 ENCOUNTER — Ambulatory Visit (INDEPENDENT_AMBULATORY_CARE_PROVIDER_SITE_OTHER): Payer: Medicare Other | Admitting: Psychiatry

## 2022-07-05 VITALS — BP 101/64 | HR 42 | Ht 69.0 in | Wt 203.0 lb

## 2022-07-05 DIAGNOSIS — F01A0 Vascular dementia, mild, without behavioral disturbance, psychotic disturbance, mood disturbance, and anxiety: Secondary | ICD-10-CM | POA: Diagnosis not present

## 2022-07-05 DIAGNOSIS — G309 Alzheimer's disease, unspecified: Secondary | ICD-10-CM

## 2022-07-05 MED ORDER — MEMANTINE HCL 5 MG PO TABS: 5.0000 mg | ORAL_TABLET | Freq: Every day | ORAL | 6 refills | Status: DC

## 2022-07-05 NOTE — Progress Notes (Signed)
   CC:  memory loss  Follow-up Visit  Last visit: 12/28/21  Brief HPI: 87 year old male with a history of CAD s/p stent, HTN, pAf on eliquis, CHF, renal artery stenosis, OSA on CPAP, DM2, CKD, HLD, spinal cord ependymoma, B12 deficiency who follows in clinic for memory loss.  At his last visit, brain MRI was ordered and he was referred for neuropsychological testing.  Interval History: He reported concern for worsening memory in November and was started on donepezil. Notes feeling dizzy and drowsy lately and wonders if it may be related to the medication. His memory has continued to decline. Feels like he is in a fog a lot of the time. He likes to gamble and has been sneaking money out of the bank. Took $10,000 out of the bank without telling his wife. He is no longer driving. Wife is paying the bills and managing his medications. She feels his personality has become more childlike and disinhibited.  He saw Neuropsych 05/26/22, where testing was suggestive of early vascular dementia.  Brain MRI 01/26/22 showed mild generalized cerebral atrophy and chronic small vessel disease.   Physical Exam:   Vital Signs: BP 101/64 (BP Location: Left Arm, Patient Position: Sitting, Cuff Size: Normal)   Pulse (!) 42   Ht 5\' 9"  (1.753 m)   Wt 203 lb (92.1 kg)   BMI 29.98 kg/m  GENERAL:  well appearing, in no acute distress, alert  SKIN:  Color, texture, turgor normal. No rashes or lesions HEAD:  Normocephalic/atraumatic. RESP: normal respiratory effort MSK:  No gross joint deformities.   NEUROLOGICAL: Mental Status:     07/05/2022    1:08 PM  Montreal Cognitive Assessment   Visuospatial/ Executive (0/5) 4  Naming (0/3) 3  Attention: Read list of digits (0/2) 2  Attention: Read list of letters (0/1) 1  Attention: Serial 7 subtraction starting at 100 (0/3) 3  Language: Repeat phrase (0/2) 2  Language : Fluency (0/1) 1  Abstraction (0/2) 2  Delayed Recall (0/5) 2  Orientation (0/6) 6   Total 26   Cranial Nerves: PERRL, face symmetric, no dysarthria, hearing grossly intact Motor: moves all extremities equally Gait: normal-based.  IMPRESSION: 87 year old male with a history of CAD s/p stent, HTN, pAf on eliquis, CHF, renal artery stenosis, OSA on CPAP, DM2, CKD, HLD, spinal cord ependymoma, B12 deficiency who presents for follow up of memory loss. Neuropsychological testing was consistent with early vascular dementia. He has not been able to tolerate donepezil due to side effects. Will stop this and trial memantine for his memory.  PLAN: -Stop donepezil. Start Namenda 5 mg daily   Follow-up: 7 months  I spent a total of 46 minutes on the date of the service. Discussed medication side effects, adverse reactions and drug interactions. Written educational materials and patient instructions outlining all of the above were given.  Genia Harold, MD 07/05/22 1:56 PM

## 2022-07-19 DIAGNOSIS — I10 Essential (primary) hypertension: Secondary | ICD-10-CM | POA: Diagnosis not present

## 2022-07-19 DIAGNOSIS — N184 Chronic kidney disease, stage 4 (severe): Secondary | ICD-10-CM | POA: Diagnosis not present

## 2022-07-19 DIAGNOSIS — E1122 Type 2 diabetes mellitus with diabetic chronic kidney disease: Secondary | ICD-10-CM | POA: Diagnosis not present

## 2022-07-19 DIAGNOSIS — E782 Mixed hyperlipidemia: Secondary | ICD-10-CM | POA: Diagnosis not present

## 2022-07-19 DIAGNOSIS — I5022 Chronic systolic (congestive) heart failure: Secondary | ICD-10-CM | POA: Diagnosis not present

## 2022-07-19 DIAGNOSIS — I480 Paroxysmal atrial fibrillation: Secondary | ICD-10-CM | POA: Diagnosis not present

## 2022-07-21 DIAGNOSIS — N281 Cyst of kidney, acquired: Secondary | ICD-10-CM | POA: Diagnosis not present

## 2022-07-21 DIAGNOSIS — I5022 Chronic systolic (congestive) heart failure: Secondary | ICD-10-CM | POA: Diagnosis not present

## 2022-07-21 DIAGNOSIS — N179 Acute kidney failure, unspecified: Secondary | ICD-10-CM | POA: Diagnosis not present

## 2022-07-21 DIAGNOSIS — N1832 Chronic kidney disease, stage 3b: Secondary | ICD-10-CM | POA: Diagnosis not present

## 2022-07-21 DIAGNOSIS — E785 Hyperlipidemia, unspecified: Secondary | ICD-10-CM | POA: Diagnosis not present

## 2022-07-21 DIAGNOSIS — E1122 Type 2 diabetes mellitus with diabetic chronic kidney disease: Secondary | ICD-10-CM | POA: Diagnosis not present

## 2022-07-21 DIAGNOSIS — D649 Anemia, unspecified: Secondary | ICD-10-CM | POA: Diagnosis not present

## 2022-07-21 DIAGNOSIS — I129 Hypertensive chronic kidney disease with stage 1 through stage 4 chronic kidney disease, or unspecified chronic kidney disease: Secondary | ICD-10-CM | POA: Diagnosis not present

## 2022-08-22 DIAGNOSIS — N1832 Chronic kidney disease, stage 3b: Secondary | ICD-10-CM | POA: Diagnosis not present

## 2022-08-22 DIAGNOSIS — I10 Essential (primary) hypertension: Secondary | ICD-10-CM | POA: Diagnosis not present

## 2022-08-22 DIAGNOSIS — F320 Major depressive disorder, single episode, mild: Secondary | ICD-10-CM | POA: Diagnosis not present

## 2022-08-22 DIAGNOSIS — I2510 Atherosclerotic heart disease of native coronary artery without angina pectoris: Secondary | ICD-10-CM | POA: Diagnosis not present

## 2022-08-22 DIAGNOSIS — Z0000 Encounter for general adult medical examination without abnormal findings: Secondary | ICD-10-CM | POA: Diagnosis not present

## 2022-08-22 DIAGNOSIS — E1122 Type 2 diabetes mellitus with diabetic chronic kidney disease: Secondary | ICD-10-CM | POA: Diagnosis not present

## 2022-08-22 DIAGNOSIS — E538 Deficiency of other specified B group vitamins: Secondary | ICD-10-CM | POA: Diagnosis not present

## 2022-08-22 DIAGNOSIS — I5022 Chronic systolic (congestive) heart failure: Secondary | ICD-10-CM | POA: Diagnosis not present

## 2022-08-22 DIAGNOSIS — Z79899 Other long term (current) drug therapy: Secondary | ICD-10-CM | POA: Diagnosis not present

## 2022-08-22 DIAGNOSIS — I480 Paroxysmal atrial fibrillation: Secondary | ICD-10-CM | POA: Diagnosis not present

## 2022-08-22 DIAGNOSIS — Z23 Encounter for immunization: Secondary | ICD-10-CM | POA: Diagnosis not present

## 2022-08-22 DIAGNOSIS — D649 Anemia, unspecified: Secondary | ICD-10-CM | POA: Diagnosis not present

## 2022-08-22 DIAGNOSIS — E559 Vitamin D deficiency, unspecified: Secondary | ICD-10-CM | POA: Diagnosis not present

## 2022-08-22 DIAGNOSIS — E782 Mixed hyperlipidemia: Secondary | ICD-10-CM | POA: Diagnosis not present

## 2022-09-07 DIAGNOSIS — D649 Anemia, unspecified: Secondary | ICD-10-CM | POA: Diagnosis not present

## 2022-09-19 DIAGNOSIS — I5022 Chronic systolic (congestive) heart failure: Secondary | ICD-10-CM | POA: Diagnosis not present

## 2022-09-19 DIAGNOSIS — I480 Paroxysmal atrial fibrillation: Secondary | ICD-10-CM | POA: Diagnosis not present

## 2022-09-19 DIAGNOSIS — N1832 Chronic kidney disease, stage 3b: Secondary | ICD-10-CM | POA: Diagnosis not present

## 2022-09-19 DIAGNOSIS — E782 Mixed hyperlipidemia: Secondary | ICD-10-CM | POA: Diagnosis not present

## 2022-09-19 DIAGNOSIS — I10 Essential (primary) hypertension: Secondary | ICD-10-CM | POA: Diagnosis not present

## 2022-09-19 DIAGNOSIS — F320 Major depressive disorder, single episode, mild: Secondary | ICD-10-CM | POA: Diagnosis not present

## 2022-09-19 DIAGNOSIS — E1122 Type 2 diabetes mellitus with diabetic chronic kidney disease: Secondary | ICD-10-CM | POA: Diagnosis not present

## 2022-09-30 DIAGNOSIS — N1832 Chronic kidney disease, stage 3b: Secondary | ICD-10-CM | POA: Diagnosis not present

## 2022-10-01 NOTE — Progress Notes (Unsigned)
Cardiology Clinic Note   Patient Name: [name] Date of Encounter: 10/04/2022  Primary Care Provider:  Emilio Aspen, MD Primary Cardiologist:  Bryan Lemma, MD  Patient Profile    [name] 87 year old male presents the clinic today for follow-up evaluation of his chronic combined systolic and diastolic CHF, PAF, and coronary artery disease.  Past Medical History    Past Medical History:  Diagnosis Date   Arthritis    Atrial fibrillation (HCC)    CAD S/P percutaneous coronary angioplasty 08/2004   a) CARDIOLOGIST-  DR DAVID HARDING; DES to LCx. X2  - Cypher 2.5 mm postdilated to 2.75 mm (20 mm and 13 mm stents);; Myoview 07/2020 ++ -> Cath with diffuse 90-100% CTO of p-dRCA (L-R collaterals), mid LAD 75 (SYNERGY DES 2.5 X 20 - 3.1>2.8), Mid-Distal LCx 65% & 70% prior to patent stent --> DES PCI (overlaps old) - SYNERGY 2.75 X 38 -> 3.1 mm)   Cardiac arrhythmia    Chronic kidney disease    Colon polyps 05/10/2005   Hyperplastic polyps   Complication of anesthesia    "takes long time to wake up"   Depression    Diabetes mellitus type 2 with complications (HCC)    neuropathy; CAD borderline- no med   Diastolic dysfunction without heart failure    Moderate LVH, Gr 1 DD on Echo 2011; no CHF admissions, no tt on diuretisc   Essential hypertension    H/O: gout STABLE   History of melanoma excision 2010   FOREHEAD   History of prostate cancer 2004--  S/P SEED IMPLANTS    NO RECURRENCE   Hydrocele, left    Hyperlipidemia LDL goal <70    Hypertension    Impaired hearing RIGHT HEARING AID   Melanoma (HCC)    Memory changes    OSA on CPAP    bipap   PAF (paroxysmal atrial fibrillation) (HCC) 12/11/2017   Admitted with Afib RVR - converted on IV Diltiazem. d/c on Eliquis; CHA2DS2Vasc = 5.   Prostate cancer [facility])    prostate , melanoma head   Shingles 2007   Syncope and collapse 06/2015   Likely related to post micturition, Neurontin and dehydration  with orthostatic hypotension   Urgency of urination    Past Surgical History:  Procedure Laterality Date   Cardiac Event Monitor  3-07/2015   Mostly SR - 58-132 bpm. Rare PVCs (occ bigeminy), Frequent PACs - singlets, couplets - short runs of PAT.   circumsision     CORONARY ANGIOPLASTY WITH STENT PLACEMENT  09/01/2004   Cypher DES x 2 d-m LCx 2.5 mm x 20 mm & 2.5 mm x 13 mm (~2.75 mm)   CORONARY STENT INTERVENTION N/A 08/04/2020   Procedure: CORONARY STENT INTERVENTION;  Surgeon: Marykay Lex, MD;  Location: [facility] INVASIVE CV LAB;  Service: Cardiovascular;; STAGED: mid LAD 75% (SYNERGY DES 2.5 X 20 --> 3.1-28 mm post-dilation; Mid LCx 65% - distal 70% prior to old stents => DES PCI (overlapps old stent & crossing OM1) SYNERGY DES 2.75 X 38 -> 3.1 mm)   EXCISION MELANOMA FROM FOREHEAD  2010   EYE SURGERY Bilateral    cataracts   HYDROCELE EXCISION  10/14/2011   Procedure: HYDROCELECTOMY ADULT;  Surgeon: Kathi Ludwig, MD;  Location: [facility];  Service: Urology;  Laterality: Left;   LAMINECTOMY N/A 07/13/2012   Procedure: THORACIC LAMINECTOMY FOR RESECTION OF INTRAMEDULLARY SPINAL CHORD TUMOR WITH SPINAL CHORD MONITORING;  Surgeon: Jomarie Longs  Venetia Maxon, MD;  Location: [facility];  Service: Neurosurgery;  Laterality: N/A;  Thoracic laminectomy for resection of intramedullary spinal cord tumor with spinal cord monitering and Dr. Franky Macho to assist   LAMINECTOMY N/A 12/09/2014   Procedure: Redo Thoracic laminectomy with intramedullary tumar resection/USN/Cusa/Subarachnoid shunt/spinal cord monitoring/Dr. Conchita Paris to assist;  Surgeon: Maeola Harman, MD;  Location: [facility];  Service: Neurosurgery;  Laterality: N/A;  Redo Thoracic laminectomy with intramedullary tumar resection/USN/[place]/Subarachnoid shunt/spinal cord monitoring/Dr. Conchita Paris to assist   LEFT HEART CATH AND CORONARY ANGIOGRAPHY N/A 08/03/2020   Procedure: LEFT HEART CATH AND CORONARY ANGIOGRAPHY;  Surgeon:  Marykay Lex, MD;  Location: [facility] INVASIVE CV LAB;;Proximal-mid RCA 99% with 90% side branch R VM.  Distal RCA 100%.  Proximal LAD 40%.  Mid LAD 75%.  Proximal LCx 65%.  Proximal-mid LCx 30%.  Mid LCx 70% with widely patent mid to distal LCx stent patent.  Mildly elevated LVEDP. - staged PCI LAD & LCx   NM MYOVIEW LTD  3/212014; June 2016   Both Lexiscan: a. LOW RISK, mild inferior bowel artifact; No ischemia or Infarction; b. Low risk, normal study. No infarction or ischemia. No artifact.   NM MYOVIEW LTD  07/09/2020   Fixed mid to apical anteroseptal perfusion defect with hypokinesia in this area consistent with prior infarct.  EF estimated 50%.  Also fixed inferior perfusion defect and apical defect with normal wall motion consistent with artifact.  Read as low risk, no ischemia, however there is a significant anterior defect not present on previous study.Marland Kitchen   PROSTATE PALLADIUM GOLD SEED IMPLANTS (118)  11/15/2002   PROSTATE CANCER   SKIN BIOPSY     TONSILLECTOMY     TRANSTHORACIC ECHOCARDIOGRAM  12/2017   (A. fib): Moderate concentric LVH.  EF 55 to 60%.  No wall motion normality.  Mild to moderately dilated left atrium.   TRANSTHORACIC ECHOCARDIOGRAM  01/19/2021   EF 40 to 45%.  Apical akinesis-concerning for LAD infarction.  Moderate concentric LVH.  Normal RV function.  (Consistent with known RCA occlusion with LAD disease    Allergies  Allergies  Allergen Reactions   Betadine [Povidone Iodine] Anaphylaxis   Contrast Media [Iodinated Contrast Media] Anaphylaxis and Rash    Other Reaction: Other reaction   Iodine Anaphylaxis    Shellfish, IVP dye. Swelling in throat and foaming at mouth. Other reaction(s): foam at mouth   Shellfish Allergy Anaphylaxis   Spironolactone Other (See Comments)     Hyperkalemia    Gabapentin Other (See Comments)    Other reaction(s): dizzy, fainted    History of Present Illness    [name] has a PMH of coronary artery disease status post  PCI with DES x 2 to his circumflex 2006, additional DES to circumflex stenting to his LAD 4/22, paroxysmal atrial fibrillation CHA2DS2-VASc score 6 on apixaban, hyperlipidemia, chronic systolic and diastolic CHF EF 14-78% (TTE 01/19/2021), CKD stage IIIb, chronic fatigue and aortic atherosclerosis.  He was seen in follow-up by Dr. Herbie Baltimore on 07/07/2021.  During that time he reported feeling lethargic and weak.  He indicated he had several falls.  He was more depressed.  He denied chest pain and pressure.  He had lost weight due to reduced calorie diet.  He denied symptoms of heart failure and was only noted to have trivial lower extremity edema.  He denied palpitations.  He was somewhat limited in his physical activity due to his back pain and leg weakness.  His olmesartan/HCTZ was discontinued and  his carvedilol was uptitrated from 3.125 to 12.5 mg twice daily.  He presented to the clinic 03/29/22 for follow-up evaluation and stated he continued to have fatigue.  He presented with his wife.  He reported that he was able to gamble in [place] for several hours and do fine with playing craps.  His wife reported dietary indiscretion.  We reviewed the importance of increased physical activity and low-salt diet.  They expressed understanding.  She also reported that he had increasing dementia and was on Aricept.  He was following with neurology as well and had plans for further diagnostics.  He reported compliance with this medication and denied side effects.  I continued his medication regimen, gave the salty 6 diet sheet and planned follow-up in 6 months.  He presents to the clinic today for follow-up evaluation and states he continues to do well from a heart standpoint.  He was diagnosed with vascular dementia in January.  His wife is concerned about his gambling.  She has been meeting with a support group at [facility].  His blood pressure today is 96/60.  He has been taking amlodipine which was listed as a as  needed medication.  I have instructed them to stop taking amlodipine and continue to monitor blood pressure.  He had recent labs drawn at his PCP.  I have asked him to increase his physical activity and we will plan follow-up in 6 months.  Today he denies chest pain, shortness of breath, lower extremity edema,  palpitations, melena, hematuria, hemoptysis, diaphoresis, weakness, presyncope, syncope, orthopnea, and PND.    Home Medications    Prior to Admission medications   Medication Sig Start Date End Date Taking? Authorizing Provider  apixaban (ELIQUIS) 2.5 MG TABS tablet Take 1 tablet (2.5 mg total) by mouth 2 (two) times daily. 08/05/20   Cyndi Bender, NP  carvedilol (COREG) 6.25 MG tablet Take 12.5 mg by mouth 2 (two) times daily. Patient not taking: Reported on 12/28/2021 06/13/21   [provider]  chlorthalidone (HYGROTON) 25 MG tablet Take 12.5 mg by mouth daily.    [provider]  Cholecalciferol (VITAMIN D3) 50 MCG (2000 UT) CAPS Take 2,000 Units by mouth daily.    [provider]  clopidogrel (PLAVIX) 75 MG tablet TAKE 1 TABLET BY MOUTH EVERY DAY 06/02/21   Marykay Lex, MD  donepezil (ARICEPT) 5 MG tablet Take 1 tablet (5 mg total) by mouth daily. 02/21/22   Ocie Doyne, MD  escitalopram (LEXAPRO) 20 MG tablet Take 20 mg by mouth daily. 09/29/20   [provider]  ketoconazole (NIZORAL) 2 % cream Apply 1 application. topically 2 (two) times daily. 06/03/21   [provider]  LORazepam (ATIVAN) 0.5 MG tablet Take 1-2 pills 30 minutes prior to MRI 12/28/21   Ocie Doyne, MD  metFORMIN (GLUCOPHAGE) 500 MG tablet Take 1 tablet (500 mg total) by mouth 2 (two) times daily with a meal. Patient not taking: Reported on 12/28/2021 12/12/17   Leone Brand, NP  nitroGLYCERIN (NITROSTAT) 0.4 MG SL tablet Place 1 tablet (0.4 mg total) under the tongue every 5 (five) minutes as needed for chest pain. 02/02/21 02/02/22  Marykay Lex, MD   olmesartan-hydrochlorothiazide (BENICAR HCT) 40-12.5 MG tablet Take 1 tablet by mouth daily. 10/04/21   [provider]  ONETOUCH ULTRA test strip USE TO TEST BLOOD SUGAR ONCE OR TWICE A DAY (E11.22) 06/27/19   [provider]  oxybutynin (DITROPAN XL) 15 MG 24 hr tablet Take  1 tablet (15 mg total) by mouth at bedtime. 12/25/14   Angiulli, Mcarthur Rossetti, PA-C  rosuvastatin (CRESTOR) 20 MG tablet Take 20 mg by mouth every evening.    [provider]  tamsulosin (FLOMAX) 0.4 MG CAPS capsule Take 1 capsule (0.4 mg total) by mouth at bedtime. 12/25/14   Angiulli, Mcarthur Rossetti, PA-C  telmisartan (MICARDIS) 80 MG tablet Take 80 mg by mouth daily. Patient not taking: Reported on 07/13/2021    [provider]    Family History    Family History  Problem Relation Age of Onset   Hypertension Mother    Stroke Mother    Heart disease Mother    Hypertension Father    Prostate cancer Father    Heart disease Father    Hypertension Brother    Prostate cancer Brother    Colon cancer Neg Hx    Esophageal cancer Neg Hx    Stomach cancer Neg Hx    He indicated that his mother is deceased. He indicated that his father is deceased. He indicated that both of his sisters are alive. He indicated that all of his three brothers are alive. He indicated that both of his children are alive. He indicated that the status of his neg hx is unknown.  Social History    Social History   Socioeconomic History   Marital status: Married    Spouse name: Not on file   Number of children: 2   Years of education: Not on file   Highest education level: Not on file  Occupational History   Occupation: Sales  Tobacco Use   Smoking status: Never   Smokeless tobacco: Never   Tobacco comments:    occ alcohol  Vaping Use   Vaping Use: Never used  Substance and Sexual Activity   Alcohol use: Not Currently    Comment: OCCASIONAL   Drug use: No   Sexual activity: Not on file  Other Topics Concern    Not on file  Social History Narrative   12/28/21 He is married, father of 2, grandfather of 6.    Does not really get exercise now because of his back issues.    Does not smoke and drinks social alcohol. Works in Education officer, community.   Social Determinants of Health   Financial Resource Strain: Not on file  Food Insecurity: Not on file  Transportation Needs: Not on file  Physical Activity: Not on file  Stress: Not on file  Social Connections: Not on file  Intimate Partner Violence: Not on file     Review of Systems    General:  No chills, fever, night sweats or weight changes.  Cardiovascular:  No chest pain, dyspnea on exertion, edema, orthopnea, palpitations, paroxysmal nocturnal dyspnea. Dermatological: No rash, lesions/masses Respiratory: No cough, dyspnea Urologic: No hematuria, dysuria Abdominal:   No nausea, vomiting, diarrhea, bright red blood per rectum, melena, or hematemesis Neurologic:  No visual changes, wkns, changes in mental status. All other systems reviewed and are otherwise negative except as noted above.  Physical Exam    VS:  BP 96/60 (BP Location: Left Arm, Patient Position: Sitting, Cuff Size: Large)   Pulse 70   Ht 5\' 9"  (1.753 m)   Wt 216 lb 9.6 oz (98.2 kg)   SpO2 97%   BMI 31.99 kg/m  , BMI Body mass index is 31.99 kg/m. GEN: Well nourished, well developed, in no acute distress. HEENT: normal. Neck: Supple, no JVD, carotid bruits, or masses. Cardiac: Irregularly irregular,  no murmurs, rubs, or gallops. No clubbing, cyanosis, edema.  Radials/DP/PT 2+ and equal bilaterally.  Respiratory:  Respirations regular and unlabored, clear to auscultation bilaterally. GI: Soft, nontender, nondistended, BS + x 4. MS: no deformity or atrophy. Skin: warm and dry, no rash. Neuro:  Strength and sensation are intact. Psych: Normal affect.  Accessory Clinical Findings    Recent Labs: No results found for requested labs within last 365 days.   Recent Lipid  Panel    Component Value Date/Time   CHOL 106 08/05/2020 0949   CHOL 145 10/02/2012 1002   TRIG 121 08/05/2020 0949   TRIG 157 (H) 10/02/2012 1002   HDL 38 (L) 08/05/2020 0949   HDL 36 (L) 10/02/2012 1002   CHOLHDL 2.8 08/05/2020 0949   VLDL 24 08/05/2020 0949   LDLCALC 44 08/05/2020 0949   LDLCALC 78 10/02/2012 1002         ECG personally reviewed by me today-none today.  Cardiac catheterization 08/04/2020  Prox Cx lesion is 65% stenosed. Prox Cx to Mid Cx lesion is 30% stenosed. Mid Cx lesion is 75% stenosed. Non-stenotic Mid Cx to Dist Cx stent was previously treated. A drug-eluting stent was successfully placed (covering all 3 lesions and crossing OM1, overlapping previous stent) using a SYNERGY XD 2.75X38. Deployed to 3.0 mm. Post intervention, there is a 5% residual stenosis in the AV groove ostium. Otherwise 0% residual stenosis throughout including at the overlap site. -------------------------- Prox LAD lesion is 40% stenosed. Mid LAD lesion is 75% stenosed. A drug-eluting stent was successfully placed using a SYNERGY XD 2.50X20. Postdilated to 3.1 mm. Post intervention, there is a 0% residual stenosis.   SUMMARY Successful 2-vessel PCI: Mid to distal LCx lesions treated with a single Synergy DES 2.7 mm x 38 mm postdilated to 3.0 mm.  (Overlaps previous stent, and crosses OM1 proximally) Mid LAD lesion treated with Synergy DES 2.24mm x 20 mm (postdilated tapered from 3.1 to 2.8 mm) Moderately elevated LVEDP of 19 mmHg.   RECOMMENDATIONS 8 HR post-cath hydration - but will give 1 dose Lasix 40 mg x 1 given LVEDP 19 mmHg Recheck BMP ~1600 - consider potential d/c in PM   F/u with me when he returns from his trip.       Bryan Lemma, MD  Diagnostic Dominance: Right  Intervention       Echocardiogram 01/19/2021  IMPRESSIONS     1. Apical segments are akinetic including the apex. Findings concerning  for LAD infarction. No LV thrombus on contrast  imaging. Left ventricular  ejection fraction, by estimation, is 40 to 45%. The left ventricle has  mildly decreased function. The left  ventricle demonstrates regional wall motion abnormalities (see scoring  diagram/findings for description). There is moderate concentric left  ventricular hypertrophy. Left ventricular diastolic parameters are  consistent with Grade I diastolic dysfunction  (impaired relaxation).   2. Right ventricular systolic function is normal. The right ventricular  size is normal. Tricuspid regurgitation signal is inadequate for assessing  PA pressure.   3. The mitral valve is grossly normal. Mild mitral valve regurgitation.  No evidence of mitral stenosis.   4. The aortic valve is tricuspid. There is mild calcification of the  aortic valve. Aortic valve regurgitation is trivial. No aortic stenosis is  present.   Comparison(s): Changes from prior study are noted. EF is reduced to  40-45%. New WMA.   FINDINGS   Left Ventricle: Apical segments are akinetic including the apex. Findings  concerning for LAD  infarction. No LV thrombus on contrast imaging. Left  ventricular ejection fraction, by estimation, is 40 to 45%. The left  ventricle has mildly decreased  function. The left ventricle demonstrates regional wall motion  abnormalities. Definity contrast agent was given IV to delineate the left  ventricular endocardial borders. The left ventricular internal cavity size  was normal in size. There is moderate  concentric left ventricular hypertrophy. Left ventricular diastolic  parameters are consistent with Grade I diastolic dysfunction (impaired  relaxation).     LV Wall Scoring:  The apical septal segment, apical anterior segment, apical inferior  segment,  and apex are akinetic.   Right Ventricle: The right ventricular size is normal. No increase in  right ventricular wall thickness. Right ventricular systolic function is  normal. Tricuspid regurgitation  signal is inadequate for assessing PA  pressure.   Left Atrium: Left atrial size was normal in size.   Right Atrium: Right atrial size was normal in size.   Pericardium: Trivial pericardial effusion is present.   Mitral Valve: The mitral valve is grossly normal. Mild mitral valve  regurgitation. No evidence of mitral valve stenosis.   Tricuspid Valve: The tricuspid valve is grossly normal. Tricuspid valve  regurgitation is trivial. No evidence of tricuspid stenosis.   Aortic Valve: The aortic valve is tricuspid. There is mild calcification  of the aortic valve. Aortic valve regurgitation is trivial. No aortic  stenosis is present.   Pulmonic Valve: The pulmonic valve was grossly normal. Pulmonic valve  regurgitation is not visualized. No evidence of pulmonic stenosis.   Aorta: The aortic root and ascending aorta are structurally normal, with  no evidence of dilitation.   Venous: The inferior vena cava was not well visualized.   IAS/Shunts: The atrial septum is grossly normal.   Assessment & Plan   1.  Coronary artery disease-denies  chest pain. Status post PCI with DES x 2 to his circumflex 2006, additional DES to circumflex stenting to his LAD 4/22. Continue  rosuvastatin, carvedilol Heart healthy low-sodium diet-reviewed Increase physical activity as tolerated  Chronic combined systolic and diastolic CHF-euvolemic today.  Weight stable.  NYHA class II-3.  Echocardiogram 10/22 showed EF of 40-45% Continue carvedilol, chlorthalidone  Paroxysmal atrial fibrillation-heart rate today 70 bpm.  Denies recent episodes of accelerated or irregular heartbeat.  CHA2DS2-VASc score 6.  Compliant with apixaban and denies bleeding issues. Continue apixaban, carvedilol Heart healthy low-sodium diet Avoid triggers caffeine, chocolate, EtOH, dehydration etc.-reviewed  Hyperlipidemia-LDL 43 3/23 Continue rosuvastatin Heart healthy low-sodium high-fiber diet Increase physical activity as  tolerated Follows with pcp  Fatigue-continues to be stable, chronic.   Continue heart healthy  diet Maintain physical activity as tolerated  Stage IIIb CKD-creatinine 2.090 01/18/22 Follows with PCP  Vascular dementia-more noticeable with short-term memory.  Wife concerned about gambling hobby.  He has lost around $5-$10000 in the last several months.  His wife is now taken over the finances. Continue to go to support group at [facility] Following with PCP  Disposition: Follow-up with Dr. Herbie Baltimore in 6 months.   Thomasene Ripple. Genavieve Mangiapane NP-C     10/04/2022, 2:57 PM [facility] [address] Office [phone] Fax [phone]    I spent 14 minutes examining this patient, reviewing medications, and using patient centered shared decision making involving her cardiac care.  Prior to her visit I spent greater than 20 minutes reviewing her past medical history,  medications, and prior cardiac tests.

## 2022-10-04 ENCOUNTER — Encounter: Payer: Self-pay | Admitting: General Practice

## 2022-10-04 ENCOUNTER — Ambulatory Visit: Payer: Medicare Other | Attending: General Practice | Admitting: General Practice

## 2022-10-04 VITALS — BP 96/60 | HR 70 | Ht 69.0 in | Wt 216.6 lb

## 2022-10-04 DIAGNOSIS — I25119 Atherosclerotic heart disease of native coronary artery with unspecified angina pectoris: Secondary | ICD-10-CM | POA: Diagnosis not present

## 2022-10-04 DIAGNOSIS — N390 Urinary tract infection, site not specified: Secondary | ICD-10-CM | POA: Diagnosis not present

## 2022-10-04 DIAGNOSIS — I5042 Chronic combined systolic (congestive) and diastolic (congestive) heart failure: Secondary | ICD-10-CM | POA: Insufficient documentation

## 2022-10-04 DIAGNOSIS — E785 Hyperlipidemia, unspecified: Secondary | ICD-10-CM | POA: Insufficient documentation

## 2022-10-04 DIAGNOSIS — F0150 Vascular dementia without behavioral disturbance: Secondary | ICD-10-CM | POA: Diagnosis not present

## 2022-10-04 DIAGNOSIS — N1832 Chronic kidney disease, stage 3b: Secondary | ICD-10-CM | POA: Insufficient documentation

## 2022-10-04 DIAGNOSIS — R5382 Chronic fatigue, unspecified: Secondary | ICD-10-CM | POA: Diagnosis not present

## 2022-10-04 DIAGNOSIS — I480 Paroxysmal atrial fibrillation: Secondary | ICD-10-CM | POA: Diagnosis not present

## 2022-10-04 NOTE — Patient Instructions (Signed)
Medication Instructions:  STOP AMLODIPINE *If you need a refill on your cardiac medications before your next appointment, please call your pharmacy*   Lab Work: NONE If you have labs (blood work) drawn today and your tests are completely normal, you will receive your results only by: MyChart Message (if you have MyChart) OR A paper copy in the mail If you have any lab test that is abnormal or we need to change your treatment, we will call you to review the results.   Testing/Procedures: NONE   Follow-Up: At [facility], you and your health needs are our priority.  As part of our continuing mission to provide you with exceptional heart care, we have created designated Provider Care Teams.  These Care Teams include your primary Cardiologist (physician) and Advanced Practice Providers (APPs -  Physician Assistants and Nurse Practitioners) who all work together to provide you with the care you need, when you need it.  We recommend signing up for the patient portal called "MyChart".  Sign up information is provided on this After Visit Summary.  MyChart is used to connect with patients for Virtual Visits (Telemedicine).  Patients are able to view lab/test results, encounter notes, upcoming appointments, etc.  Non-urgent messages can be sent to your provider as well.   To learn more about what you can do with MyChart, go to [email or web address].    Your next appointment:   6 month(s)  Provider:   Edd Fabian, FNP        Other Instructions MAINTAIN PHYSICAL ACTIVITY

## 2022-10-27 DIAGNOSIS — M79641 Pain in right hand: Secondary | ICD-10-CM | POA: Diagnosis not present

## 2022-10-27 DIAGNOSIS — M79642 Pain in left hand: Secondary | ICD-10-CM | POA: Diagnosis not present

## 2022-11-17 ENCOUNTER — Encounter: Payer: Self-pay | Admitting: Cardiology

## 2022-11-24 DIAGNOSIS — F02A0 Dementia in other diseases classified elsewhere, mild, without behavioral disturbance, psychotic disturbance, mood disturbance, and anxiety: Secondary | ICD-10-CM | POA: Diagnosis not present

## 2022-12-02 DIAGNOSIS — Z23 Encounter for immunization: Secondary | ICD-10-CM | POA: Diagnosis not present

## 2022-12-04 DIAGNOSIS — F02A0 Dementia in other diseases classified elsewhere, mild, without behavioral disturbance, psychotic disturbance, mood disturbance, and anxiety: Secondary | ICD-10-CM | POA: Diagnosis not present

## 2022-12-22 DIAGNOSIS — H52223 Regular astigmatism, bilateral: Secondary | ICD-10-CM | POA: Diagnosis not present

## 2022-12-22 DIAGNOSIS — H5203 Hypermetropia, bilateral: Secondary | ICD-10-CM | POA: Diagnosis not present

## 2022-12-22 DIAGNOSIS — H524 Presbyopia: Secondary | ICD-10-CM | POA: Diagnosis not present

## 2022-12-22 DIAGNOSIS — E119 Type 2 diabetes mellitus without complications: Secondary | ICD-10-CM | POA: Diagnosis not present

## 2022-12-22 DIAGNOSIS — Z961 Presence of intraocular lens: Secondary | ICD-10-CM | POA: Diagnosis not present

## 2022-12-22 DIAGNOSIS — H04123 Dry eye syndrome of bilateral lacrimal glands: Secondary | ICD-10-CM | POA: Diagnosis not present

## 2023-01-03 DIAGNOSIS — F02A0 Dementia in other diseases classified elsewhere, mild, without behavioral disturbance, psychotic disturbance, mood disturbance, and anxiety: Secondary | ICD-10-CM | POA: Diagnosis not present

## 2023-01-19 DIAGNOSIS — L570 Actinic keratosis: Secondary | ICD-10-CM | POA: Diagnosis not present

## 2023-01-19 DIAGNOSIS — L304 Erythema intertrigo: Secondary | ICD-10-CM | POA: Diagnosis not present

## 2023-01-26 DIAGNOSIS — Z23 Encounter for immunization: Secondary | ICD-10-CM | POA: Diagnosis not present

## 2023-01-30 DIAGNOSIS — N184 Chronic kidney disease, stage 4 (severe): Secondary | ICD-10-CM | POA: Diagnosis not present

## 2023-02-03 DIAGNOSIS — F02A0 Dementia in other diseases classified elsewhere, mild, without behavioral disturbance, psychotic disturbance, mood disturbance, and anxiety: Secondary | ICD-10-CM | POA: Diagnosis not present

## 2023-02-14 DIAGNOSIS — L244 Irritant contact dermatitis due to drugs in contact with skin: Secondary | ICD-10-CM | POA: Diagnosis not present

## 2023-02-28 DIAGNOSIS — N1832 Chronic kidney disease, stage 3b: Secondary | ICD-10-CM | POA: Diagnosis not present

## 2023-03-05 DIAGNOSIS — F02A0 Dementia in other diseases classified elsewhere, mild, without behavioral disturbance, psychotic disturbance, mood disturbance, and anxiety: Secondary | ICD-10-CM | POA: Diagnosis not present

## 2023-03-07 DIAGNOSIS — D649 Anemia, unspecified: Secondary | ICD-10-CM | POA: Diagnosis not present

## 2023-03-07 DIAGNOSIS — N281 Cyst of kidney, acquired: Secondary | ICD-10-CM | POA: Diagnosis not present

## 2023-03-07 DIAGNOSIS — N1832 Chronic kidney disease, stage 3b: Secondary | ICD-10-CM | POA: Diagnosis not present

## 2023-03-07 DIAGNOSIS — I129 Hypertensive chronic kidney disease with stage 1 through stage 4 chronic kidney disease, or unspecified chronic kidney disease: Secondary | ICD-10-CM | POA: Diagnosis not present

## 2023-03-07 DIAGNOSIS — E785 Hyperlipidemia, unspecified: Secondary | ICD-10-CM | POA: Diagnosis not present

## 2023-03-07 DIAGNOSIS — E119 Type 2 diabetes mellitus without complications: Secondary | ICD-10-CM | POA: Diagnosis not present

## 2023-03-07 DIAGNOSIS — I5022 Chronic systolic (congestive) heart failure: Secondary | ICD-10-CM | POA: Diagnosis not present

## 2023-03-07 DIAGNOSIS — E1122 Type 2 diabetes mellitus with diabetic chronic kidney disease: Secondary | ICD-10-CM | POA: Diagnosis not present

## 2023-03-09 ENCOUNTER — Ambulatory Visit: Payer: Medicare Other | Admitting: Psychiatry

## 2023-03-30 DIAGNOSIS — E1122 Type 2 diabetes mellitus with diabetic chronic kidney disease: Secondary | ICD-10-CM | POA: Diagnosis not present

## 2023-03-30 DIAGNOSIS — N1832 Chronic kidney disease, stage 3b: Secondary | ICD-10-CM | POA: Diagnosis not present

## 2023-03-30 DIAGNOSIS — F320 Major depressive disorder, single episode, mild: Secondary | ICD-10-CM | POA: Diagnosis not present

## 2023-03-30 DIAGNOSIS — Z79899 Other long term (current) drug therapy: Secondary | ICD-10-CM | POA: Diagnosis not present

## 2023-03-30 DIAGNOSIS — G4733 Obstructive sleep apnea (adult) (pediatric): Secondary | ICD-10-CM | POA: Diagnosis not present

## 2023-03-30 DIAGNOSIS — I5042 Chronic combined systolic (congestive) and diastolic (congestive) heart failure: Secondary | ICD-10-CM | POA: Diagnosis not present

## 2023-03-30 DIAGNOSIS — I480 Paroxysmal atrial fibrillation: Secondary | ICD-10-CM | POA: Diagnosis not present

## 2023-03-30 DIAGNOSIS — I701 Atherosclerosis of renal artery: Secondary | ICD-10-CM | POA: Diagnosis not present

## 2023-03-30 DIAGNOSIS — E782 Mixed hyperlipidemia: Secondary | ICD-10-CM | POA: Diagnosis not present

## 2023-03-30 DIAGNOSIS — I2510 Atherosclerotic heart disease of native coronary artery without angina pectoris: Secondary | ICD-10-CM | POA: Diagnosis not present

## 2023-03-30 DIAGNOSIS — F0150 Vascular dementia without behavioral disturbance: Secondary | ICD-10-CM | POA: Diagnosis not present

## 2023-03-30 DIAGNOSIS — I10 Essential (primary) hypertension: Secondary | ICD-10-CM | POA: Diagnosis not present

## 2023-04-05 DIAGNOSIS — F02A0 Dementia in other diseases classified elsewhere, mild, without behavioral disturbance, psychotic disturbance, mood disturbance, and anxiety: Secondary | ICD-10-CM | POA: Diagnosis not present

## 2023-05-06 DIAGNOSIS — F02A0 Dementia in other diseases classified elsewhere, mild, without behavioral disturbance, psychotic disturbance, mood disturbance, and anxiety: Secondary | ICD-10-CM | POA: Diagnosis not present

## 2023-06-03 DIAGNOSIS — F02A0 Dementia in other diseases classified elsewhere, mild, without behavioral disturbance, psychotic disturbance, mood disturbance, and anxiety: Secondary | ICD-10-CM | POA: Diagnosis not present

## 2023-06-06 ENCOUNTER — Ambulatory Visit: Payer: Medicare Other | Attending: Cardiology | Admitting: Cardiology

## 2023-06-06 ENCOUNTER — Encounter: Payer: Self-pay | Admitting: Cardiology

## 2023-06-06 VITALS — BP 114/58 | HR 66 | Ht 69.0 in | Wt 224.0 lb

## 2023-06-06 DIAGNOSIS — N1832 Chronic kidney disease, stage 3b: Secondary | ICD-10-CM | POA: Diagnosis not present

## 2023-06-06 DIAGNOSIS — R5382 Chronic fatigue, unspecified: Secondary | ICD-10-CM

## 2023-06-06 DIAGNOSIS — D6869 Other thrombophilia: Secondary | ICD-10-CM | POA: Diagnosis not present

## 2023-06-06 DIAGNOSIS — I5042 Chronic combined systolic (congestive) and diastolic (congestive) heart failure: Secondary | ICD-10-CM

## 2023-06-06 DIAGNOSIS — I2510 Atherosclerotic heart disease of native coronary artery without angina pectoris: Secondary | ICD-10-CM | POA: Diagnosis not present

## 2023-06-06 DIAGNOSIS — F0153 Vascular dementia, unspecified severity, with mood disturbance: Secondary | ICD-10-CM | POA: Diagnosis not present

## 2023-06-06 DIAGNOSIS — E785 Hyperlipidemia, unspecified: Secondary | ICD-10-CM | POA: Diagnosis not present

## 2023-06-06 DIAGNOSIS — I25119 Atherosclerotic heart disease of native coronary artery with unspecified angina pectoris: Secondary | ICD-10-CM

## 2023-06-06 DIAGNOSIS — I480 Paroxysmal atrial fibrillation: Secondary | ICD-10-CM

## 2023-06-06 DIAGNOSIS — I10 Essential (primary) hypertension: Secondary | ICD-10-CM

## 2023-06-06 DIAGNOSIS — E669 Obesity, unspecified: Secondary | ICD-10-CM | POA: Diagnosis not present

## 2023-06-06 NOTE — Progress Notes (Signed)
 Cardiology Office Note:  .   Date:  06/11/2023  ID:  [name], DOB [birth year], MRN [id number] PCP: Emilio Aspen, MD  [facility] Providers Cardiologist:  Bryan Lemma, MD     Chief Complaint  Patient presents with   Follow-up   Coronary Artery Disease    No complaints of chest pain   Atrial Fibrillation    Not aware of being in A-fib or not.   Cardiomyopathy    No PND, orthopnea or edema.    Patient Profile: .     [name] is a 88 y.o. male with a pertinent CV PMH noted below who presents here for 9 month at the request of Emilio Aspen, *.  MV CAD & mild ICM PAF/Atrial Flutter HLD, HTN Stg III b CKD Vascular Dementia Spinal tumor with residual neurologic defects    I last saw [name] in April 2023 -> he is doing pretty lethargic.  Multiple falls.  More depressed.  More signs of dementia.  Weight loss with poor appetite.  Thankfully, no heart failure or angina symptoms.  No palpitations.  Persistent pedal neuropathy with his spinal surgery. => Because of fatigue issues we decided to wean off of carvedilol with a slow taper.  Since then has been seen twice by Edd Fabian, NP (in December 2023 and recently on October 04, 2022. -> During these visits, the notes indicate that he was still on carvedilol, but he was not taking it.  There was concerns about his memory issues and gambling.  Thought to be NYHA class II CHF but euvolemic.  No changes made.  Subjective  Discussed the use of AI scribe software for clinical note transcription with the patient, who gave verbal consent to proceed.  History of Present Illness   [name] is a 89 year old male with MV-CAD (PCI LAD & LCx with CTO of RCA), ICM (EF 40-45% - Mild CHF) HLN, hypertension, PAF, CKD3b & Vascular Dementia who presents for a cardiology follow-up. He is accompanied by his wife, [name].  He experiences occasional heart palpitations, describing them as his heart racing  or skipping beats that are random & fleeting - nothing prolonged. No shortness of breath or chest pain during physical activity. He is on Eliquis and no longer on Plavix.  He denies any PND or orthopnea.  Only minimal swelling.  No syncope or near syncope, TIA/CVA or amaurosis fugax. He does not do much exercise, but is able to get around - walks with a cane for mobility and denies any recent falls.  Blood pressure readings are typically around 150/80 mmHg, but he recently had a reading that was unusually low, which he believes was incorrect. He takes amlodipine 5 mg daily and Olmesartan-HCTZ (40-12.5 mg) at 1/2 tablet dose, with instructions to take a full tablet if his blood pressure is high.  He experiences occasional dizziness, particularly noting an episode after swallowing incorrectly. No episodes of syncope, but sometimes feels dizzy, especially after being active or at the casino.  He reports memory issues, stating 'I forget sometimes' and needing reminders for daily activities. He engages in activities like trivia quizzes to help maintain cognitive function.  He mentions difficulty with sleep, using one pillow, and denies waking up short of breath.      ROS:  Review of Systems - Negative except unsteady gait, walks with a cane.  Has leg weakness from chronic back issues.  Also has a urologic issues that are stable.  Continue memory loss but seems to be coping.  Depression seems to be pretty well-controlled at present.  Doing better with sleep but still having issues more related to nocturia.    Objective   Medications CV:- Amlodipine 5 mg daily; - Olmesartan-HCTZ 40-12.5 mg tab - taking 1/2 tab daily; - Eliquis 2.5 mg twice daily DM: Metformin 500 mg twice daily, glipizide 2.5 mg every morning GU: Ditropan (oxybutynin) 15 mg nightly, tamsulosin 0.4 mg nightly Neuropsych: Aricept 5 mg daily, Namenda 5 mg daily, escitalopram 20 mg daily   Studies Reviewed: Marland Kitchen   EKG  Interpretation Date/Time:  Tuesday June 06 2023 10:11:35 EST Ventricular Rate:  66 PR Interval:  226 QRS Duration:  98 QT Interval:  414 QTC Calculation: 434 R Axis:   -34  Text Interpretation: Sinus rhythm with 1st degree A-V block with Premature supraventricular complexes Left axis deviation Abnormal QRS-T angle, consider primary T wave abnormality When compared with ECG of 04-Oct-2022 14:14, Sinus rhythm has replaced Atrial flutter Confirmed by Bryan Lemma ([id number]) on 06/06/2023 10:49:38 AM    Labs from PCP via [facility]: 08/22/2022: TC 126, TG 106, HDL 42, LDL 65.  K+ 4.5.  TSH 2.3. 01/30/2023: Hgb 15.2, Cr 2.2 03/30/2023: A1c 6.4   Myoview 07/2020: mildly reduced LVEF ~45-54%; fixed mild-apical anteroseptal region affected with hypokinesis of berry consistent with infarct. CATH 08/03/2020:: p-d RCA 99% & 90% RVM -> d RCA 100% CTO; p LAD 40%, mLAD 75%; p Cx 65%, p-m Cx 30% & mCx 70%, m-d Cx Stent patent.  Staged PCI 08/04/2020: m-d LCx => Synergy DES 2.7 mm x 38 mm -> 3.0 mm.  (Overlaps previous stent, and crosses OM1 proximally) m LAD => Synergy DES 2.6mm x 20 mm (postdilated tapered from 3.1 to 2.8 mm)  Diagnostic        Intervention   ECHO 01/19/2021: EF estimated 40 to 45% with apical anterior, apical inferior and apical akinesis consistent with LAD infarct..  GR 1 DD.  Mild MR.  AOV sclerosis with no stenosis.  Risk Assessment/Calculations:    CHA2DS2-VASc Score = 6   This indicates a 9.7% annual risk of stroke. The patient's score is based upon: CHF History: 1 HTN History: 1 Diabetes History: 1 Stroke History: 0 Vascular Disease History: 1 Age Score: 2 Gender Score: 0         Physical Exam:   VS:  BP (!) 114/58   Pulse 66   Ht 5\' 9"  (1.753 m)   Wt 224 lb (101.6 kg)   SpO2 96%   BMI 33.08 kg/m    Wt Readings from Last 3 Encounters:  06/06/23 224 lb (101.6 kg)  10/04/22 216 lb 9.6 oz (98.2 kg)  07/05/22 203 lb (92.1 kg)    GEN: Well nourished, well groomed.  In  no acute distress; actually healthy-appearing.  Much better spirits.  In a good manner.  Making jokes. NECK: No JVD; No carotid bruits CARDIAC: Normal S1, S2; RRR, no murmurs, rubs, gallops RESPIRATORY:  Clear to auscultation without rales, wheezing or rhonchi ; nonlabored, good air movement. ABDOMEN: Soft, non-tender, non-distended EXTREMITIES:  No edema; No deformity; unsteady gait, walks with a cane.     ASSESSMENT AND PLAN: .    Problem List Items Addressed This Visit       Cardiology Problems   Chronic combined systolic and diastolic heart failure (HCC) (Chronic)   EF of 40 to 45% with no active CHF symptoms.  No PND, orthopnea and trivial edema.  Euvolemic  on exam. Exertional dyspnea is more related to deconditioning and spinal stenosis.  Likely NYHA Class IIa.  He is now back on Benicar HCTZ taking 1/2 of the 40-12.5 mg tablet (unless blood pressures are elevated) for afterload reduction with addition of amlodipine daily. No diuretic requirement-most because of his urinary issues exacerbated by his neurologic issues from his spinal tumor. -Continue current meds for now.      Coronary artery disease involving native coronary artery without angina pectoris (Chronic)   He never really had them isolated any angina and we therefore probably missed progression of disease.  Now has occluded RCA with severe LCx and LAD disease treated with stents.  His symptoms have always been atypical and his jovial joking personality tends to make it difficult to determine if he is being truthful.  He would tendency to downplay symptoms. Thankfully, he seems to be doing pretty well with no recurrent anginal symptoms since PCI. No longer on Thienopyridine (Plavix) because of requirement of Eliquis. No longer on beta-blocker because of fatigue. No longer on rosuvastatin because of memory issues. -Continue amlodipine for antianginal benefit and blood pressure along with Benicar HCTZ for afterload  reduction.  Monitor for symptoms.      Essential hypertension - Primary (Chronic)   Blood pressure readings vary, sometimes reaching high levels.  Is currently taking olmesartan-HCTZ 40-12.5 mg 1/2 tab in the morning and 5 mg of amlodipine in the evenings Patient's wife adjusts amlodipine and olmesartan doses based on readings. => Lowering or increasing the doses based on blood pressures and time of day. -Continue current management with amlodipine and olmesartan, adjusting doses as needed based on blood pressure readings.      Relevant Orders   EKG 12-Lead (Completed)   Hypercoagulable state due to PAF [facility]) Eliquis with CHA2DS2VASc of 6 (Chronic)   CHA2DS2-VASc or is 6. With CKD 3B, on reduced dose of Eliquis at 2.5 mg twice daily Continue Eliquis at current dose. Okay to hold Eliquis for procedures or surgeries based on protocol-2 to 3 days preop.      Hyperlipidemia LDL goal <70 (Chronic)   Given his advanced age with dementia issues, he is no longer on statin. Most recent lipid panel from May 2024 showed LDL of 65.  Close enough to goal given his advanced age. His dietary habits have changed and he is not eating as much.  Continue to monitor while he is no longer on statin. With his memory issues, will try to avoid statin unless necessary.       PAF (paroxysmal atrial fibrillation) ; CHA2DS2-VASc Score =6 (Eliquis) (Chronic)   No symptoms of palpitations or irregular heartbeats. No atrial flutter or fibrillation noted today. No longer on rate control agents-carvedilol discontinued due to fatigue.  Seems to be on a rate control. -Continue current management -> on Eliquis 2.5 mg twice daily for stroke prophylaxis.      Relevant Orders   EKG 12-Lead (Completed)   Vascular dementia with depression (HCC) (Chronic)   Patient reports forgetfulness and difficulty remembering things. This seems to have stabilized and depression symptoms are notably improved. -Encourage cognitive  exercises such as trivia quizzes and Sudoku puzzles to maintain cognitive function. -Continue Lexapro and for what is worth Namenda and Aricept.        Other   Chronic fatigue   Probably multifactorial.  We stopped his beta-blocker and statin with some benefit. .  I think a good portion of this was probably related to his depression which  seems to be much better controlled.      Obesity (BMI 30-39.9) (Chronic)   -Encourage regular physical activity, such as using a stationary bike. -Advise moderation in diet, particularly with sweets.      Stage 3b chronic kidney disease (CKD) (HCC) (Chronic)   Creatinine has been stable between 2-2.5.  Precludes use of spironolactone, and requires dose adjustment for Eliquis. Overall stable        Follow-Up: Return in about 6 months (around 12/07/2023) for Routine follow up with me, [employer].     Signed, Marykay Lex, MD, MS Bryan Lemma, M.D., M.S. Interventional Cardiologist  [facility]  Pager # [phone] Phone # [phone] [address] [place], [place] [address]

## 2023-06-06 NOTE — Patient Instructions (Signed)
Medication Instructions:   No changes *If you need a refill on your cardiac medications before your next appointment, please call your pharmacy*   Lab Work:  Not needed   Testing/Procedures: Not needed   Follow-Up: At [facility], you and your health needs are our priority.  As part of our continuing mission to provide you with exceptional heart care, we have created designated Provider Care Teams.  These Care Teams include your primary Cardiologist (physician) and Advanced Practice Providers (APPs -  Physician Assistants and Nurse Practitioners) who all work together to provide you with the care you need, when you need it.     Your next appointment:   6 month(s)  The format for your next appointment:   In Person  Provider:   David Harding, MD    

## 2023-06-10 ENCOUNTER — Encounter: Payer: Self-pay | Admitting: Cardiology

## 2023-06-10 NOTE — Progress Notes (Incomplete)
 Cardiology Office Note:  .   Date:  06/10/2023  ID:  [name], DOB 11-01-1935, MRN [id number] PCP: Emilio Aspen, MD  [facility] Providers Cardiologist:  Bryan Lemma, MD { Click to update primary MD,subspecialty MD or APP then REFRESH:1}    No chief complaint on file.   Patient Profile: .     [name] is a 88 y.o. male with a pertinent CV PMH noted below who presents here for *** at the request of Emilio Aspen, *.  MV CAD & mild ICM PAF/Atrial Flutter HLD, HTN Stg III b CKD Vascular Dementia Spinal tumor with residual neurologic defects    I last saw [name] in April 2023 -> he is doing pretty lethargic.  Multiple falls.  More depressed.  More signs of dementia.  Weight loss with poor appetite.  Thankfully, no heart failure or angina symptoms.  No palpitations.  Persistent pedal neuropathy with his spinal surgery. => Because of fatigue issues we decided to wean off of carvedilol with a slow taper.  Since then has been seen twice by Edd Fabian, NP (in December 2023 and recently on October 04, 2022. ->   Subjective  Discussed the use of AI scribe software for clinical note transcription with the patient, who gave verbal consent to proceed.  History of Present Illness         Cardiovascular ROS: {roscv:[id number]}  ROS:  Review of Systems - {ros master:310782}    Objective    Studies Reviewed: Marland Kitchen   EKG Interpretation Date/Time:  Tuesday June 06 2023 10:11:35 EST Ventricular Rate:  66 PR Interval:  226 QRS Duration:  98 QT Interval:  414 QTC Calculation: 434 R Axis:   -34  Text Interpretation: Sinus rhythm with 1st degree A-V block with Premature supraventricular complexes Left axis deviation Abnormal QRS-T angle, consider primary T wave abnormality When compared with ECG of 04-Oct-2022 14:14, Sinus rhythm has replaced Atrial flutter Confirmed by Bryan Lemma ([id number]) on 06/06/2023 10:49:38 AM    Labs from PCP via  [facility]: 08/22/2022: TC 126, TG 106, HDL 42, HDL 55.  K+ 4.5.  TSH 2.3. 01/30/2023: Hgb 15.2, Cr 2.2 03/30/2023: A1c 6.4   Myoview 07/2020: mildly reduced LVEF ~45-54%; fixed mild-apical anteroseptal region affected with hypokinesis of berry consistent with infarct. CATH 08/03/2020:: p-d RCA 99% & 90% RVM -> d RCA 100% CTO; p LAD 40%, mLAD 75%; p Cx 65%, p-m Cx 30% & mCx 70%, m-d Cx Stent patent.  Staged PCI 08/04/2020: m-d LCx => Synergy DES 2.7 mm x 38 mm -> 3.0 mm.  (Overlaps previous stent, and crosses OM1 proximally) m LAD => Synergy DES 2.51mm x 20 mm (postdilated tapered from 3.1 to 2.8 mm)  Diagnostic        Intervention   ECHO 01/19/2021: EF estimated 40 to 45% with apical anterior, apical inferior and apical akinesis consistent with LAD infarct..  GR 1 DD.  Mild MR.  AOV sclerosis with no stenosis.  Risk Assessment/Calculations:    CHA2DS2-VASc Score = 6  {Confirm score is correct.  If not, click here to update score.  REFRESH note.  :1} This indicates a 9.7% annual risk of stroke. The patient's score is based upon: CHF History: 1 HTN History: 1 Diabetes History: 1 Stroke History: 0 Vascular Disease History: 1 Age Score: 2 Gender Score: 0   {This patient has a significant risk of stroke if diagnosed with atrial fibrillation.  Please consider VKA or DOAC agent  for anticoagulation if the bleeding risk is acceptable.   You can also use the SmartPhrase .[email or web address] for documentation.   :[id number]}      Physical Exam:   VS:  BP (!) 114/58   Pulse 66   Ht 5\' 9"  (1.753 m)   Wt 224 lb (101.6 kg)   SpO2 96%   BMI 33.08 kg/m    Wt Readings from Last 3 Encounters:  06/06/23 224 lb (101.6 kg)  10/04/22 216 lb 9.6 oz (98.2 kg)  07/05/22 203 lb (92.1 kg)    GEN: Well nourished, well groomed.  In no acute distress; actually healthy-appearing.  Much better spirits.  In a good manner.  Making jokes. NECK: No JVD; No carotid bruits CARDIAC: Normal S1, S2; RRR, no murmurs, rubs,  gallops RESPIRATORY:  Clear to auscultation without rales, wheezing or rhonchi ; nonlabored, good air movement. ABDOMEN: Soft, non-tender, non-distended EXTREMITIES:  No edema; No deformity; unsteady gait, walks with a cane.     ASSESSMENT AND PLAN: .    Problem List Items Addressed This Visit       Cardiology Problems   Chronic combined systolic and diastolic heart failure (HCC) (Chronic)   Coronary artery disease involving native coronary artery of native heart with angina pectoris (HCC) (Chronic)   Essential hypertension - Primary (Chronic)   Relevant Orders   EKG 12-Lead (Completed)   Hypercoagulable state due to PAF (HCC) Eliquis with CHA2DS2VASc of 6 (Chronic)   Hyperlipidemia LDL goal <70 (Chronic)   PAF (paroxysmal atrial fibrillation) ; CHA2DS2-VASc Score =6 (Eliquis) (Chronic)   Relevant Orders   EKG 12-Lead (Completed)     Other   Stage 3b chronic kidney disease (CKD) (HCC) (Chronic)    Assessment and Plan                {Are you ordering a CV Procedure (e.g. stress test, cath, DCCV, TEE, etc)?   Press F2        :[id number]}   Follow-Up: Return in about 6 months (around 12/07/2023).  Total time spent: *** min spent with patient + *** min spent charting = *** min    Signed, Marykay Lex, MD, MS Bryan Lemma, M.D., M.S. Interventional Cardiologist  [facility]  Pager # [phone] Phone # [phone] [address] [place], [place] [address]

## 2023-06-11 ENCOUNTER — Encounter: Payer: Self-pay | Admitting: Cardiology

## 2023-06-11 DIAGNOSIS — F0153 Vascular dementia, unspecified severity, with mood disturbance: Secondary | ICD-10-CM | POA: Insufficient documentation

## 2023-06-11 NOTE — Assessment & Plan Note (Signed)
 No symptoms of palpitations or irregular heartbeats. No atrial flutter or fibrillation noted today. No longer on rate control agents-carvedilol discontinued due to fatigue.  Seems to be on a rate control. -Continue current management -> on Eliquis 2.5 mg twice daily for stroke prophylaxis.

## 2023-06-11 NOTE — Assessment & Plan Note (Signed)
 Patient reports forgetfulness and difficulty remembering things. This seems to have stabilized and depression symptoms are notably improved. -Encourage cognitive exercises such as trivia quizzes and Sudoku puzzles to maintain cognitive function. -Continue Lexapro and for what is worth Namenda and Aricept.

## 2023-06-11 NOTE — Assessment & Plan Note (Signed)
 EF of 40 to 45% with no active CHF symptoms.  No PND, orthopnea and trivial edema.  Euvolemic on exam. Exertional dyspnea is more related to deconditioning and spinal stenosis.  Likely NYHA Class IIa.  He is now back on Benicar HCTZ taking 1/2 of the 40-12.5 mg tablet (unless blood pressures are elevated) for afterload reduction with addition of amlodipine daily. No diuretic requirement-most because of his urinary issues exacerbated by his neurologic issues from his spinal tumor. -Continue current meds for now.

## 2023-06-11 NOTE — Assessment & Plan Note (Signed)
 Creatinine has been stable between 2-2.5.  Precludes use of spironolactone, and requires dose adjustment for Eliquis. Overall stable

## 2023-06-11 NOTE — Assessment & Plan Note (Signed)
 CHA2DS2-VASc or is 6. With CKD 3B, on reduced dose of Eliquis at 2.5 mg twice daily Continue Eliquis at current dose. Okay to hold Eliquis for procedures or surgeries based on protocol-2 to 3 days preop.

## 2023-06-11 NOTE — Assessment & Plan Note (Signed)
 Given his advanced age with dementia issues, he is no longer on statin. Most recent lipid panel from May 2024 showed LDL of 65.  Close enough to goal given his advanced age. His dietary habits have changed and he is not eating as much.  Continue to monitor while he is no longer on statin. With his memory issues, will try to avoid statin unless necessary.

## 2023-06-11 NOTE — Assessment & Plan Note (Signed)
 Blood pressure readings vary, sometimes reaching high levels.  Is currently taking olmesartan-HCTZ 40-12.5 mg 1/2 tab in the morning and 5 mg of amlodipine in the evenings Patient's wife adjusts amlodipine and olmesartan doses based on readings. => Lowering or increasing the doses based on blood pressures and time of day. -Continue current management with amlodipine and olmesartan, adjusting doses as needed based on blood pressure readings.

## 2023-06-11 NOTE — Assessment & Plan Note (Addendum)
 Probably multifactorial.  We stopped his beta-blocker and statin with some benefit. .  I think a good portion of this was probably related to his depression which seems to be much better controlled.

## 2023-06-11 NOTE — Assessment & Plan Note (Signed)
 He never really had them isolated any angina and we therefore probably missed progression of disease.  Now has occluded RCA with severe LCx and LAD disease treated with stents.  His symptoms have always been atypical and his jovial joking personality tends to make it difficult to determine if he is being truthful.  He would tendency to downplay symptoms. Thankfully, he seems to be doing pretty well with no recurrent anginal symptoms since PCI. No longer on Thienopyridine (Plavix) because of requirement of Eliquis. No longer on beta-blocker because of fatigue. No longer on rosuvastatin because of memory issues. -Continue amlodipine for antianginal benefit and blood pressure along with Benicar HCTZ for afterload reduction.  Monitor for symptoms.

## 2023-06-11 NOTE — Assessment & Plan Note (Signed)
-  Encourage regular physical activity, such as using a stationary bike. -Advise moderation in diet, particularly with sweets.

## 2023-07-04 DIAGNOSIS — F02A0 Dementia in other diseases classified elsewhere, mild, without behavioral disturbance, psychotic disturbance, mood disturbance, and anxiety: Secondary | ICD-10-CM | POA: Diagnosis not present

## 2023-07-23 DIAGNOSIS — I701 Atherosclerosis of renal artery: Secondary | ICD-10-CM | POA: Diagnosis not present

## 2023-07-23 DIAGNOSIS — I10 Essential (primary) hypertension: Secondary | ICD-10-CM | POA: Diagnosis not present

## 2023-07-23 DIAGNOSIS — I5042 Chronic combined systolic (congestive) and diastolic (congestive) heart failure: Secondary | ICD-10-CM | POA: Diagnosis not present

## 2023-07-23 DIAGNOSIS — I5022 Chronic systolic (congestive) heart failure: Secondary | ICD-10-CM | POA: Diagnosis not present

## 2023-08-01 ENCOUNTER — Ambulatory Visit: Payer: PRIVATE HEALTH INSURANCE | Admitting: Physical Therapy

## 2023-08-01 ENCOUNTER — Telehealth: Payer: Self-pay | Admitting: Psychiatry

## 2023-08-01 NOTE — Telephone Encounter (Signed)
 [name] has called asking if since Dr Billy Bue is no longer with the practice , can his care go on to Dr Tresia Fruit. Phone rep confirmed with New Pt referrals that Dr Tresia Fruit would be ok to use as a provider going forward with pt's care. Pt's upcoming appointment is on wait list.

## 2023-08-02 DIAGNOSIS — I480 Paroxysmal atrial fibrillation: Secondary | ICD-10-CM | POA: Diagnosis not present

## 2023-08-02 DIAGNOSIS — I10 Essential (primary) hypertension: Secondary | ICD-10-CM | POA: Diagnosis not present

## 2023-08-02 DIAGNOSIS — I5042 Chronic combined systolic (congestive) and diastolic (congestive) heart failure: Secondary | ICD-10-CM | POA: Diagnosis not present

## 2023-08-02 DIAGNOSIS — I701 Atherosclerosis of renal artery: Secondary | ICD-10-CM | POA: Diagnosis not present

## 2023-08-02 DIAGNOSIS — Z8546 Personal history of malignant neoplasm of prostate: Secondary | ICD-10-CM | POA: Diagnosis not present

## 2023-08-02 DIAGNOSIS — I5022 Chronic systolic (congestive) heart failure: Secondary | ICD-10-CM | POA: Diagnosis not present

## 2023-08-03 ENCOUNTER — Ambulatory Visit: Attending: Internal Medicine

## 2023-08-03 DIAGNOSIS — F02A0 Dementia in other diseases classified elsewhere, mild, without behavioral disturbance, psychotic disturbance, mood disturbance, and anxiety: Secondary | ICD-10-CM | POA: Diagnosis not present

## 2023-08-15 ENCOUNTER — Ambulatory Visit: Admitting: Physical Therapy

## 2023-08-21 ENCOUNTER — Telehealth: Payer: Self-pay | Admitting: Neurology

## 2023-08-21 ENCOUNTER — Ambulatory Visit (INDEPENDENT_AMBULATORY_CARE_PROVIDER_SITE_OTHER): Admitting: Neurology

## 2023-08-21 ENCOUNTER — Encounter: Payer: Self-pay | Admitting: Neurology

## 2023-08-21 VITALS — BP 136/62 | HR 54 | Ht 69.0 in | Wt 215.0 lb

## 2023-08-21 DIAGNOSIS — F03B0 Unspecified dementia, moderate, without behavioral disturbance, psychotic disturbance, mood disturbance, and anxiety: Secondary | ICD-10-CM | POA: Diagnosis not present

## 2023-08-21 DIAGNOSIS — R413 Other amnesia: Secondary | ICD-10-CM | POA: Diagnosis not present

## 2023-08-21 DIAGNOSIS — R269 Unspecified abnormalities of gait and mobility: Secondary | ICD-10-CM | POA: Diagnosis not present

## 2023-08-21 DIAGNOSIS — I701 Atherosclerosis of renal artery: Secondary | ICD-10-CM | POA: Diagnosis not present

## 2023-08-21 DIAGNOSIS — I5022 Chronic systolic (congestive) heart failure: Secondary | ICD-10-CM | POA: Diagnosis not present

## 2023-08-21 DIAGNOSIS — I5042 Chronic combined systolic (congestive) and diastolic (congestive) heart failure: Secondary | ICD-10-CM | POA: Diagnosis not present

## 2023-08-21 DIAGNOSIS — I10 Essential (primary) hypertension: Secondary | ICD-10-CM | POA: Diagnosis not present

## 2023-08-21 NOTE — Progress Notes (Signed)
 Chief Complaint  Patient presents with   Memory Loss    Rm15, wife present, moca score of 19     Numbness    Rm15, wife present, wide shuffling gait due to neuropathy on bottoms of feet       ASSESSMENT AND PLAN  [name] is a 88 y.o. male   Dementia  MoCA examination 19/30,  No history of stepwise worsening, denies history of stroke, MRI of the brain showed generalized atrophy, mild small vessel disease,  Most consistent with central nervous system degenerative disorder,  Laboratory evaluation to rule out treatable etiology, discussed with patient and his wife, would like to proceed with Alzheimer profile, APOE genetic variation Gait abnormality History of thoracic ependymoma decompression  Home physical therapy  DIAGNOSTIC DATA (LABS, IMAGING, TESTING) - I reviewed patient records, labs, notes, testing and imaging myself where available.   MEDICAL HISTORY:  [name], is a 88 year old male accompanied by his wife, follow-up for dementia,  he was seen by Dr. Billy Bue in 2024, his primary care is from [facility] Dr. Teofilo Fellers, Arlyce Lambert     History is obtained from the patient and review of electronic medical records. I personally reviewed pertinent available imaging films in PACS.   PMHx of  HTN DM A fib-on Eliquis  Prostate cancer, s/p radiation. Melanoma at forehead OSA-CPAP HLD CAD.  Thoracic biopsy in 2014, Laminectomy 2016, presenting with lower extremity numbness   He retired from Airline pilot at age 73, over past 5 years he has gradual onset memory loss, repeating questions, wife also reported that he has become " childish", disinhibited, like to joke, drove to the visit lane couple years ago, now had quit driving  He lives with his wife at home, sedentary lifestyle, likes to watch TV, play games  His younger brother at age 70 also has suffered dementia  Per Dr. Margrette Shield record, neuropsychology evaluation in February 2020 for suggestive of early vascular  dementia, he denies a history of stroke, his memory issue is more of a slow decline, rather than stepwise sudden worsening, MoCA examination also showed significant short-term memory loss  He also have a long history of gait abnormality, started around 2016 following his thoracic decompression surgery for thoracic ependymoma,  Postsurgical MRI thoracic in Nov 2017, 1. Overall satisfactory postoperative appearance. Dorsal spinal cord resection cavity from T9-T10 to the mid T11 level with minimal peripheral enhancement. Regressed T9 level spinal cord T2 and STIR signal abnormality compared to the preoperative MRI on 08/08/2014.    PHYSICAL EXAM:   Vitals:   08/21/23 1100  BP: 136/62  Pulse: (!) 54  Weight: 215 lb (97.5 kg)  Height: 5\' 9"  (1.753 m)     Body mass index is 31.75 kg/m.  PHYSICAL EXAMNIATION:  Gen: NAD, conversant, well nourised, well groomed                     Cardiovascular: Regular rate rhythm, no peripheral edema, warm, nontender. Eyes: Conjunctivae clear without exudates or hemorrhage Neck: Supple, no carotid bruits. Pulmonary: Clear to auscultation bilaterally   NEUROLOGICAL EXAM:  MENTAL STATUS: Speech/cognition: Awake, alert, oriented to history taking and casual conversation    08/21/2023   11:07 AM 07/05/2022    1:08 PM  Montreal Cognitive Assessment   Visuospatial/ Executive (0/5) 3 4  Naming (0/3) 2 3  Attention: Read list of digits (0/2) 2 2  Attention: Read list of letters (0/1) 1 1  Attention: Serial 7 subtraction starting at 100 (  0/3) 3 3  Language: Repeat phrase (0/2) 2 2  Language : Fluency (0/1) 1 1  Abstraction (0/2) 1 2  Delayed Recall (0/5) 0 2  Orientation (0/6) 4 6  Total 19 26    CRANIAL NERVES: CN II: Visual fields are full to confrontation. Pupils are round equal and briskly reactive to light. CN III, IV, VI: extraocular movement are normal. No ptosis. CN V: Facial sensation is intact to light touch CN VII: Face is  symmetric with normal eye closure  CN VIII: Hearing is normal to causal conversation. CN IX, X: Phonation is normal. CN XI: Head turning and shoulder shrug are intact  MOTOR: Upper extremity motor strength is normal, mild left ankle dorsiflexion weakness  REFLEXES: Reflexes are 1  and symmetric at the biceps, triceps, knees, and ankles. Plantar responses are flexor.  SENSORY: Mild length-dependent sensory loss  COORDINATION: There is no trunk or limb dysmetria noted.  GAIT/STANCE: Push-up to get up from seated position, wide-based, cautious, unsteady, dragging left leg more  REVIEW OF SYSTEMS:  Full 14 system review of systems performed and notable only for as above All other review of systems were negative.   ALLERGIES: Allergies  Allergen Reactions   Betadine [Povidone Iodine] Anaphylaxis   Contrast Media [Iodinated Contrast Media] Anaphylaxis and Rash    Other Reaction: Other reaction   Iodine Anaphylaxis    Shellfish, IVP dye. Swelling in throat and foaming at mouth. Other reaction(s): foam at mouth   Shellfish Allergy Anaphylaxis   Spironolactone  Other (See Comments)     Hyperkalemia    Gabapentin Other (See Comments)    Other reaction(s): dizzy, fainted    HOME MEDICATIONS: Current Outpatient Medications  Medication Sig Dispense Refill   amLODipine  (NORVASC ) 5 MG tablet Take 5 mg by mouth daily.     apixaban  (ELIQUIS ) 2.5 MG TABS tablet Take 1 tablet (2.5 mg total) by mouth 2 (two) times daily. 180 tablet 2   Cholecalciferol (VITAMIN D3) 50 MCG (2000 UT) CAPS Take 2,000 Units by mouth daily.     Cyanocobalamin  (VITAMIN B 12 PO) Take 1 tablet by mouth daily.     escitalopram  (LEXAPRO ) 20 MG tablet Take 20 mg by mouth daily.     Ferrous Sulfate Dried (FERROUS SULFATE CR PO) Take 1 tablet by mouth daily.     glipiZIDE 2.5 MG TABS Take 1 tablet by mouth every morning.     ketoconazole (NIZORAL) 2 % cream Apply 1 application. topically 2 (two) times daily.      nitroGLYCERIN  (NITROSTAT ) 0.4 MG SL tablet Place 1 tablet (0.4 mg total) under the tongue every 5 (five) minutes as needed for chest pain. 25 tablet 2   olmesartan -hydrochlorothiazide (BENICAR  HCT) 40-12.5 MG tablet Take 0.5 tablets by mouth daily.     ONETOUCH ULTRA test strip USE TO TEST BLOOD SUGAR ONCE OR TWICE A DAY (E11.22)     oxybutynin  (DITROPAN  XL) 15 MG 24 hr tablet Take 1 tablet (15 mg total) by mouth at bedtime. 30 tablet 1   tamsulosin  (FLOMAX ) 0.4 MG CAPS capsule Take 1 capsule (0.4 mg total) by mouth at bedtime. 30 capsule 1   No current facility-administered medications for this visit.    PAST MEDICAL HISTORY: Past Medical History:  Diagnosis Date   Arthritis    Atrial fibrillation (HCC)    CAD S/P percutaneous coronary angioplasty 08/2004   a) CARDIOLOGIST-  DR DAVID HARDING; DES to LCx. X2  - Cypher 2.5 mm postdilated to 2.75 mm (  20 mm and 13 mm stents);; Myoview  07/2020 ++ -> Cath with diffuse 90-100% CTO of p-dRCA (L-R collaterals), mid LAD 75 (SYNERGY DES 2.5 X 20 - 3.1>2.8), Mid-Distal LCx 65% & 70% prior to patent stent --> DES PCI (overlaps old) - SYNERGY 2.75 X 38 -> 3.1 mm)   Cardiac arrhythmia    Chronic kidney disease    Colon polyps 05/10/2005   Hyperplastic polyps   Complication of anesthesia    "takes long time to wake up"   Depression    Diabetes mellitus type 2 with complications (HCC)    neuropathy; CAD borderline- no med   Diastolic dysfunction without heart failure    Moderate LVH, Gr 1 DD on Echo 2011; no CHF admissions, no tt on diuretisc   Essential hypertension    H/O: gout STABLE   History of melanoma excision 2010   FOREHEAD   History of prostate cancer 2004--  S/P SEED IMPLANTS    NO RECURRENCE   Hydrocele, left    Hyperlipidemia LDL goal <70    Hypertension    Impaired hearing RIGHT HEARING AID   Melanoma (HCC)    Memory changes    OSA on CPAP    bipap   PAF (paroxysmal atrial fibrillation) (HCC) 12/11/2017   Admitted with Afib RVR  - converted on IV Diltiazem . d/c on Eliquis ; CHA2DS2Vasc = 5.   Prostate cancer [facility])    prostate , melanoma head   Shingles 2007   Syncope and collapse 06/2015   Likely related to post micturition, Neurontin and dehydration with orthostatic hypotension   Urgency of urination     PAST SURGICAL HISTORY: Past Surgical History:  Procedure Laterality Date   Cardiac Event Monitor  3-07/2015   Mostly SR - 58-132 bpm. Rare PVCs (occ bigeminy), Frequent PACs - singlets, couplets - short runs of PAT.   circumsision     CORONARY ANGIOPLASTY WITH STENT PLACEMENT  09/01/2004   Cypher DES x 2 d-m LCx 2.5 mm x 20 mm & 2.5 mm x 13 mm (~2.75 mm)   CORONARY STENT INTERVENTION N/A 08/04/2020   Procedure: CORONARY STENT INTERVENTION;  Surgeon: Arleen Lacer, MD;  Location: [facility] INVASIVE CV LAB;  Service: Cardiovascular;; STAGED: mid LAD 75% (SYNERGY DES 2.5 X 20 --> 3.1-28 mm post-dilation; Mid LCx 65% - distal 70% prior to old stents => DES PCI (overlapps old stent & crossing OM1) SYNERGY DES 2.75 X 38 -> 3.1 mm)   EXCISION MELANOMA FROM FOREHEAD  2010   EYE SURGERY Bilateral    cataracts   HYDROCELE EXCISION  10/14/2011   Procedure: HYDROCELECTOMY ADULT;  Surgeon: Edmund Gouge, MD;  Location: [facility];  Service: Urology;  Laterality: Left;   LAMINECTOMY N/A 07/13/2012   Procedure: THORACIC LAMINECTOMY FOR RESECTION OF INTRAMEDULLARY SPINAL CHORD TUMOR WITH SPINAL CHORD MONITORING;  Surgeon: Manya Sells, MD;  Location: [facility]URO OR[facility];  Service: Neurosurgery;  Laterality: N/A;  Thoracic laminectomy for resection of intramedullary spinal cord tumor with spinal cord monitering and Dr. Michale Age to assist   LAMINECTOMY N/A 12/09/2014   Procedure: Redo Thoracic laminectomy with intramedullary tumar resection/USN/[place]usa/Subarachnoid shunt/spinal cord monitoring/Dr. Nat Badger to assist;  Surgeon: Manya Sells, MD;  Location: [facility] OR[facility];  Service: Neurosurgery;  Laterality: N/A;  Redo  Thoracic laminectomy with intramedullary tumar resection/USN/[place]/Subarachnoid shunt/spinal cord monitoring/Dr. Nat Badger to assist   LEFT HEART CATH AND CORONARY ANGIOGRAPHY N/A 08/03/2020   Procedure: LEFT HEART CATH AND CORONARY ANGIOGRAPHY;  Surgeon: Arleen Lacer, MD;  Location: [facility] INVASIVE CV LAB;;Proximal-mid RCA 99% with 90% side branch R VM.  Distal RCA 100%.  Proximal LAD 40%.  Mid LAD 75%.  Proximal LCx 65%.  Proximal-mid LCx 30%.  Mid LCx 70% with widely patent mid to distal LCx stent patent.  Mildly elevated LVEDP. - staged PCI LAD & LCx   NM MYOVIEW  LTD  3/212014; June 2016   Both Lexiscan : a. LOW RISK, mild inferior bowel artifact; No ischemia or Infarction; b. Low risk, normal study. No infarction or ischemia. No artifact.   NM MYOVIEW  LTD  07/09/2020   Fixed mid to apical anteroseptal perfusion defect with hypokinesia in this area consistent with prior infarct.  EF estimated 50%.  Also fixed inferior perfusion defect and apical defect with normal wall motion consistent with artifact.  Read as low risk, no ischemia, however there is a significant anterior defect not present on previous study.Aaron Aas   PROSTATE PALLADIUM GOLD SEED IMPLANTS (118)  11/15/2002   PROSTATE CANCER   SKIN BIOPSY     TONSILLECTOMY     TRANSTHORACIC ECHOCARDIOGRAM  12/2017   (A. fib): Moderate concentric LVH.  EF 55 to 60%.  No wall motion normality.  Mild to moderately dilated left atrium.   TRANSTHORACIC ECHOCARDIOGRAM  01/19/2021   EF 40 to 45%.  Apical akinesis-concerning for LAD infarction.  Moderate concentric LVH.  Normal RV function.  (Consistent with known RCA occlusion with LAD disease    FAMILY HISTORY: Family History  Problem Relation Age of Onset   Hypertension Mother    Stroke Mother    Heart disease Mother    Hypertension Father    Prostate cancer Father    Heart disease Father    Hypertension Brother    Prostate cancer Brother    Colon cancer Neg Hx    Esophageal cancer Neg Hx     Stomach cancer Neg Hx     SOCIAL HISTORY: Social History   Socioeconomic History   Marital status: Married    Spouse name: Not on file   Number of children: 2   Years of education: Not on file   Highest education level: Not on file  Occupational History   Occupation: Sales  Tobacco Use   Smoking status: Never   Smokeless tobacco: Never   Tobacco comments:    occ alcohol  Vaping Use   Vaping status: Never Used  Substance and Sexual Activity   Alcohol use: Not Currently    Comment: OCCASIONAL   Drug use: No   Sexual activity: Yes    Partners: Female    Comment: married  Other Topics Concern   Not on file  Social History Narrative   12/28/21 He is married, father of 2, grandfather of 6.    Does not really get exercise now because of his back issues.    Does not smoke and drinks social alcohol. Works in Education officer, community.   Social Drivers of Corporate investment banker Strain: Not on file  Food Insecurity: Not on file  Transportation Needs: Not on file  Physical Activity: Not on file  Stress: Not on file  Social Connections: Not on file  Intimate Partner Violence: Not on file      Phebe Brasil, M.D. Ph.D.  [facility] [address] [place], [place] [address] Ph: [phone] Fax: [phone]  CC:  Benedetta Bradley, MD [address] [place],  [place] [address]  Benedetta Bradley, MD  +++++++++++++++++++++++

## 2023-08-21 NOTE — Telephone Encounter (Signed)
 Pt was accepted by [facility], they will reach out to him to set up.

## 2023-08-23 DIAGNOSIS — Z7984 Long term (current) use of oral hypoglycemic drugs: Secondary | ICD-10-CM | POA: Diagnosis not present

## 2023-08-23 DIAGNOSIS — N189 Chronic kidney disease, unspecified: Secondary | ICD-10-CM | POA: Diagnosis not present

## 2023-08-23 DIAGNOSIS — F03B3 Unspecified dementia, moderate, with mood disturbance: Secondary | ICD-10-CM | POA: Diagnosis not present

## 2023-08-23 DIAGNOSIS — E1122 Type 2 diabetes mellitus with diabetic chronic kidney disease: Secondary | ICD-10-CM | POA: Diagnosis not present

## 2023-08-23 DIAGNOSIS — Z7901 Long term (current) use of anticoagulants: Secondary | ICD-10-CM | POA: Diagnosis not present

## 2023-08-23 DIAGNOSIS — F32A Depression, unspecified: Secondary | ICD-10-CM | POA: Diagnosis not present

## 2023-08-23 DIAGNOSIS — I129 Hypertensive chronic kidney disease with stage 1 through stage 4 chronic kidney disease, or unspecified chronic kidney disease: Secondary | ICD-10-CM | POA: Diagnosis not present

## 2023-08-23 DIAGNOSIS — I480 Paroxysmal atrial fibrillation: Secondary | ICD-10-CM | POA: Diagnosis not present

## 2023-08-23 DIAGNOSIS — I2510 Atherosclerotic heart disease of native coronary artery without angina pectoris: Secondary | ICD-10-CM | POA: Diagnosis not present

## 2023-08-23 DIAGNOSIS — M109 Gout, unspecified: Secondary | ICD-10-CM | POA: Diagnosis not present

## 2023-08-23 DIAGNOSIS — Z8546 Personal history of malignant neoplasm of prostate: Secondary | ICD-10-CM | POA: Diagnosis not present

## 2023-08-24 DIAGNOSIS — Z0000 Encounter for general adult medical examination without abnormal findings: Secondary | ICD-10-CM | POA: Diagnosis not present

## 2023-08-24 DIAGNOSIS — F320 Major depressive disorder, single episode, mild: Secondary | ICD-10-CM | POA: Diagnosis not present

## 2023-08-24 DIAGNOSIS — I701 Atherosclerosis of renal artery: Secondary | ICD-10-CM | POA: Diagnosis not present

## 2023-08-24 DIAGNOSIS — I5042 Chronic combined systolic (congestive) and diastolic (congestive) heart failure: Secondary | ICD-10-CM | POA: Diagnosis not present

## 2023-08-24 DIAGNOSIS — I10 Essential (primary) hypertension: Secondary | ICD-10-CM | POA: Diagnosis not present

## 2023-08-24 DIAGNOSIS — Z9181 History of falling: Secondary | ICD-10-CM | POA: Diagnosis not present

## 2023-08-24 DIAGNOSIS — N1832 Chronic kidney disease, stage 3b: Secondary | ICD-10-CM | POA: Diagnosis not present

## 2023-08-24 DIAGNOSIS — E1122 Type 2 diabetes mellitus with diabetic chronic kidney disease: Secondary | ICD-10-CM | POA: Diagnosis not present

## 2023-08-24 DIAGNOSIS — E611 Iron deficiency: Secondary | ICD-10-CM | POA: Diagnosis not present

## 2023-08-24 DIAGNOSIS — F0150 Vascular dementia without behavioral disturbance: Secondary | ICD-10-CM | POA: Diagnosis not present

## 2023-08-24 DIAGNOSIS — I480 Paroxysmal atrial fibrillation: Secondary | ICD-10-CM | POA: Diagnosis not present

## 2023-08-24 DIAGNOSIS — E559 Vitamin D deficiency, unspecified: Secondary | ICD-10-CM | POA: Diagnosis not present

## 2023-08-29 DIAGNOSIS — F03B3 Unspecified dementia, moderate, with mood disturbance: Secondary | ICD-10-CM | POA: Diagnosis not present

## 2023-08-29 DIAGNOSIS — I129 Hypertensive chronic kidney disease with stage 1 through stage 4 chronic kidney disease, or unspecified chronic kidney disease: Secondary | ICD-10-CM | POA: Diagnosis not present

## 2023-08-29 DIAGNOSIS — E1122 Type 2 diabetes mellitus with diabetic chronic kidney disease: Secondary | ICD-10-CM | POA: Diagnosis not present

## 2023-08-29 DIAGNOSIS — N189 Chronic kidney disease, unspecified: Secondary | ICD-10-CM | POA: Diagnosis not present

## 2023-08-29 DIAGNOSIS — I480 Paroxysmal atrial fibrillation: Secondary | ICD-10-CM | POA: Diagnosis not present

## 2023-08-29 DIAGNOSIS — F32A Depression, unspecified: Secondary | ICD-10-CM | POA: Diagnosis not present

## 2023-08-31 DIAGNOSIS — N189 Chronic kidney disease, unspecified: Secondary | ICD-10-CM | POA: Diagnosis not present

## 2023-08-31 DIAGNOSIS — F32A Depression, unspecified: Secondary | ICD-10-CM | POA: Diagnosis not present

## 2023-08-31 DIAGNOSIS — I480 Paroxysmal atrial fibrillation: Secondary | ICD-10-CM | POA: Diagnosis not present

## 2023-08-31 DIAGNOSIS — F03B3 Unspecified dementia, moderate, with mood disturbance: Secondary | ICD-10-CM | POA: Diagnosis not present

## 2023-08-31 DIAGNOSIS — E1122 Type 2 diabetes mellitus with diabetic chronic kidney disease: Secondary | ICD-10-CM | POA: Diagnosis not present

## 2023-08-31 DIAGNOSIS — I129 Hypertensive chronic kidney disease with stage 1 through stage 4 chronic kidney disease, or unspecified chronic kidney disease: Secondary | ICD-10-CM | POA: Diagnosis not present

## 2023-09-01 ENCOUNTER — Telehealth: Payer: Self-pay | Admitting: Neurology

## 2023-09-01 LAB — COMPREHENSIVE METABOLIC PANEL WITH GFR
ALT: 12 IU/L (ref 0–44)
AST: 14 IU/L (ref 0–40)
Albumin: 4.2 g/dL (ref 3.7–4.7)
Alkaline Phosphatase: 60 IU/L (ref 44–121)
BUN/Creatinine Ratio: 14 (ref 10–24)
BUN: 35 mg/dL — ABNORMAL HIGH (ref 8–27)
Bilirubin Total: 0.3 mg/dL (ref 0.0–1.2)
CO2: 22 mmol/L (ref 20–29)
Calcium: 8.8 mg/dL (ref 8.6–10.2)
Chloride: 100 mmol/L (ref 96–106)
Creatinine, Ser: 2.54 mg/dL — ABNORMAL HIGH (ref 0.76–1.27)
Globulin, Total: 2.3 g/dL (ref 1.5–4.5)
Glucose: 79 mg/dL (ref 70–99)
Potassium: 4.2 mmol/L (ref 3.5–5.2)
Sodium: 138 mmol/L (ref 134–144)
Total Protein: 6.5 g/dL (ref 6.0–8.5)
eGFR: 24 mL/min/{1.73_m2} — ABNORMAL LOW (ref >59)

## 2023-09-01 LAB — CBC WITH DIFFERENTIAL/PLATELET
Basophils Absolute: 0.1 10*3/uL (ref 0.0–0.2)
Basos: 1 %
EOS (ABSOLUTE): 0.2 10*3/uL (ref 0.0–0.4)
Eos: 2 %
Hematocrit: 44.4 % (ref 37.5–51.0)
Hemoglobin: 14.6 g/dL (ref 13.0–17.7)
Immature Grans (Abs): 0 10*3/uL (ref 0.0–0.1)
Immature Granulocytes: 0 %
Lymphocytes Absolute: 1.1 10*3/uL (ref 0.7–3.1)
Lymphs: 13 %
MCH: 29.3 pg (ref 26.6–33.0)
MCHC: 32.9 g/dL (ref 31.5–35.7)
MCV: 89 fL (ref 79–97)
Monocytes Absolute: 0.9 10*3/uL (ref 0.1–0.9)
Monocytes: 10 %
Neutrophils Absolute: 6.3 10*3/uL (ref 1.4–7.0)
Neutrophils: 74 %
Platelets: 220 10*3/uL (ref 150–450)
RBC: 4.99 x10E6/uL (ref 4.14–5.80)
RDW: 13.3 % (ref 11.6–15.4)
WBC: 8.6 10*3/uL (ref 3.4–10.8)

## 2023-09-01 LAB — ATN PROFILE
A -- Beta-amyloid 42/40 Ratio: 0.104 (ref >0.102)
Beta-amyloid 40: 323.8 pg/mL
Beta-amyloid 42: 33.66 pg/mL
N -- NfL, Plasma: 7.72 pg/mL (ref 0.00–9.13)
T -- p-tau181: 4.17 pg/mL — ABNORMAL HIGH (ref 0.00–0.97)

## 2023-09-01 LAB — RPR: RPR Ser Ql: NONREACTIVE

## 2023-09-01 LAB — APOE ALZHEIMER'S RISK

## 2023-09-01 LAB — TSH: TSH: 3.72 u[IU]/mL (ref 0.450–4.500)

## 2023-09-01 LAB — VITAMIN B12: Vitamin B-12: 1400 pg/mL — ABNORMAL HIGH (ref 232–1245)

## 2023-09-01 NOTE — Telephone Encounter (Signed)
 Please call patient, laboratory evaluation showed:  -- Elevated p-tau 181, with normal beta amyloid 42-40 ratio, and normal concentration of neurofibrillary level, the result is not consistent with the presence of Alzheimer's related pathology,  --- APO E genotyping E3/E3, (negative for APO E4 variant), this is the most common APO E genotype, is not associated with increased risk for Alzheimer's disease  --- Chronic kidney disease, further decline of GFR to 24 from previous baseline 2 years ago of 30,  ----Rest of the laboratory evaluation showed no significant abnormality  I have forwarded result to his primary care Benedetta Bradley, MD

## 2023-09-02 DIAGNOSIS — I10 Essential (primary) hypertension: Secondary | ICD-10-CM | POA: Diagnosis not present

## 2023-09-02 DIAGNOSIS — I5042 Chronic combined systolic (congestive) and diastolic (congestive) heart failure: Secondary | ICD-10-CM | POA: Diagnosis not present

## 2023-09-02 DIAGNOSIS — I480 Paroxysmal atrial fibrillation: Secondary | ICD-10-CM | POA: Diagnosis not present

## 2023-09-02 DIAGNOSIS — Z8546 Personal history of malignant neoplasm of prostate: Secondary | ICD-10-CM | POA: Diagnosis not present

## 2023-09-02 DIAGNOSIS — I701 Atherosclerosis of renal artery: Secondary | ICD-10-CM | POA: Diagnosis not present

## 2023-09-02 DIAGNOSIS — I5022 Chronic systolic (congestive) heart failure: Secondary | ICD-10-CM | POA: Diagnosis not present

## 2023-09-03 DIAGNOSIS — F02A0 Dementia in other diseases classified elsewhere, mild, without behavioral disturbance, psychotic disturbance, mood disturbance, and anxiety: Secondary | ICD-10-CM | POA: Diagnosis not present

## 2023-09-04 NOTE — Telephone Encounter (Signed)
 Called and spoke to wife, relayed results/recommendations. Pt wife voiced gratitude and understanding of all discussed

## 2023-09-07 DIAGNOSIS — E1122 Type 2 diabetes mellitus with diabetic chronic kidney disease: Secondary | ICD-10-CM | POA: Diagnosis not present

## 2023-09-07 DIAGNOSIS — I129 Hypertensive chronic kidney disease with stage 1 through stage 4 chronic kidney disease, or unspecified chronic kidney disease: Secondary | ICD-10-CM | POA: Diagnosis not present

## 2023-09-07 DIAGNOSIS — F32A Depression, unspecified: Secondary | ICD-10-CM | POA: Diagnosis not present

## 2023-09-07 DIAGNOSIS — N189 Chronic kidney disease, unspecified: Secondary | ICD-10-CM | POA: Diagnosis not present

## 2023-09-07 DIAGNOSIS — I480 Paroxysmal atrial fibrillation: Secondary | ICD-10-CM | POA: Diagnosis not present

## 2023-09-07 DIAGNOSIS — F03B3 Unspecified dementia, moderate, with mood disturbance: Secondary | ICD-10-CM | POA: Diagnosis not present

## 2023-09-12 DIAGNOSIS — F32A Depression, unspecified: Secondary | ICD-10-CM | POA: Diagnosis not present

## 2023-09-12 DIAGNOSIS — E1122 Type 2 diabetes mellitus with diabetic chronic kidney disease: Secondary | ICD-10-CM | POA: Diagnosis not present

## 2023-09-12 DIAGNOSIS — F03B3 Unspecified dementia, moderate, with mood disturbance: Secondary | ICD-10-CM | POA: Diagnosis not present

## 2023-09-12 DIAGNOSIS — I129 Hypertensive chronic kidney disease with stage 1 through stage 4 chronic kidney disease, or unspecified chronic kidney disease: Secondary | ICD-10-CM | POA: Diagnosis not present

## 2023-09-12 DIAGNOSIS — I480 Paroxysmal atrial fibrillation: Secondary | ICD-10-CM | POA: Diagnosis not present

## 2023-09-12 DIAGNOSIS — N189 Chronic kidney disease, unspecified: Secondary | ICD-10-CM | POA: Diagnosis not present

## 2023-09-13 DIAGNOSIS — N184 Chronic kidney disease, stage 4 (severe): Secondary | ICD-10-CM | POA: Diagnosis not present

## 2023-09-14 DIAGNOSIS — F32A Depression, unspecified: Secondary | ICD-10-CM | POA: Diagnosis not present

## 2023-09-14 DIAGNOSIS — G4733 Obstructive sleep apnea (adult) (pediatric): Secondary | ICD-10-CM | POA: Diagnosis not present

## 2023-09-14 DIAGNOSIS — I129 Hypertensive chronic kidney disease with stage 1 through stage 4 chronic kidney disease, or unspecified chronic kidney disease: Secondary | ICD-10-CM | POA: Diagnosis not present

## 2023-09-14 DIAGNOSIS — N189 Chronic kidney disease, unspecified: Secondary | ICD-10-CM | POA: Diagnosis not present

## 2023-09-14 DIAGNOSIS — F03B3 Unspecified dementia, moderate, with mood disturbance: Secondary | ICD-10-CM | POA: Diagnosis not present

## 2023-09-14 DIAGNOSIS — E1122 Type 2 diabetes mellitus with diabetic chronic kidney disease: Secondary | ICD-10-CM | POA: Diagnosis not present

## 2023-09-14 DIAGNOSIS — I480 Paroxysmal atrial fibrillation: Secondary | ICD-10-CM | POA: Diagnosis not present

## 2023-09-20 DIAGNOSIS — I129 Hypertensive chronic kidney disease with stage 1 through stage 4 chronic kidney disease, or unspecified chronic kidney disease: Secondary | ICD-10-CM | POA: Diagnosis not present

## 2023-09-20 DIAGNOSIS — I701 Atherosclerosis of renal artery: Secondary | ICD-10-CM | POA: Diagnosis not present

## 2023-09-20 DIAGNOSIS — F32A Depression, unspecified: Secondary | ICD-10-CM | POA: Diagnosis not present

## 2023-09-20 DIAGNOSIS — I5042 Chronic combined systolic (congestive) and diastolic (congestive) heart failure: Secondary | ICD-10-CM | POA: Diagnosis not present

## 2023-09-20 DIAGNOSIS — I480 Paroxysmal atrial fibrillation: Secondary | ICD-10-CM | POA: Diagnosis not present

## 2023-09-20 DIAGNOSIS — E1122 Type 2 diabetes mellitus with diabetic chronic kidney disease: Secondary | ICD-10-CM | POA: Diagnosis not present

## 2023-09-20 DIAGNOSIS — I5022 Chronic systolic (congestive) heart failure: Secondary | ICD-10-CM | POA: Diagnosis not present

## 2023-09-20 DIAGNOSIS — N189 Chronic kidney disease, unspecified: Secondary | ICD-10-CM | POA: Diagnosis not present

## 2023-09-20 DIAGNOSIS — I10 Essential (primary) hypertension: Secondary | ICD-10-CM | POA: Diagnosis not present

## 2023-09-20 DIAGNOSIS — F03B3 Unspecified dementia, moderate, with mood disturbance: Secondary | ICD-10-CM | POA: Diagnosis not present

## 2023-09-21 DIAGNOSIS — N184 Chronic kidney disease, stage 4 (severe): Secondary | ICD-10-CM | POA: Diagnosis not present

## 2023-09-21 DIAGNOSIS — E1122 Type 2 diabetes mellitus with diabetic chronic kidney disease: Secondary | ICD-10-CM | POA: Diagnosis not present

## 2023-09-21 DIAGNOSIS — N281 Cyst of kidney, acquired: Secondary | ICD-10-CM | POA: Diagnosis not present

## 2023-09-21 DIAGNOSIS — I129 Hypertensive chronic kidney disease with stage 1 through stage 4 chronic kidney disease, or unspecified chronic kidney disease: Secondary | ICD-10-CM | POA: Diagnosis not present

## 2023-09-21 DIAGNOSIS — E785 Hyperlipidemia, unspecified: Secondary | ICD-10-CM | POA: Diagnosis not present

## 2023-09-21 DIAGNOSIS — I5022 Chronic systolic (congestive) heart failure: Secondary | ICD-10-CM | POA: Diagnosis not present

## 2023-09-21 DIAGNOSIS — D649 Anemia, unspecified: Secondary | ICD-10-CM | POA: Diagnosis not present

## 2023-09-22 DIAGNOSIS — Z8546 Personal history of malignant neoplasm of prostate: Secondary | ICD-10-CM | POA: Diagnosis not present

## 2023-09-22 DIAGNOSIS — M109 Gout, unspecified: Secondary | ICD-10-CM | POA: Diagnosis not present

## 2023-09-22 DIAGNOSIS — I480 Paroxysmal atrial fibrillation: Secondary | ICD-10-CM | POA: Diagnosis not present

## 2023-09-22 DIAGNOSIS — E1122 Type 2 diabetes mellitus with diabetic chronic kidney disease: Secondary | ICD-10-CM | POA: Diagnosis not present

## 2023-09-22 DIAGNOSIS — I129 Hypertensive chronic kidney disease with stage 1 through stage 4 chronic kidney disease, or unspecified chronic kidney disease: Secondary | ICD-10-CM | POA: Diagnosis not present

## 2023-09-22 DIAGNOSIS — N189 Chronic kidney disease, unspecified: Secondary | ICD-10-CM | POA: Diagnosis not present

## 2023-09-22 DIAGNOSIS — F32A Depression, unspecified: Secondary | ICD-10-CM | POA: Diagnosis not present

## 2023-09-22 DIAGNOSIS — F03B3 Unspecified dementia, moderate, with mood disturbance: Secondary | ICD-10-CM | POA: Diagnosis not present

## 2023-09-22 DIAGNOSIS — Z7984 Long term (current) use of oral hypoglycemic drugs: Secondary | ICD-10-CM | POA: Diagnosis not present

## 2023-09-22 DIAGNOSIS — Z7901 Long term (current) use of anticoagulants: Secondary | ICD-10-CM | POA: Diagnosis not present

## 2023-09-22 DIAGNOSIS — I2510 Atherosclerotic heart disease of native coronary artery without angina pectoris: Secondary | ICD-10-CM | POA: Diagnosis not present

## 2023-09-27 DIAGNOSIS — F32A Depression, unspecified: Secondary | ICD-10-CM | POA: Diagnosis not present

## 2023-09-27 DIAGNOSIS — N189 Chronic kidney disease, unspecified: Secondary | ICD-10-CM | POA: Diagnosis not present

## 2023-09-27 DIAGNOSIS — I129 Hypertensive chronic kidney disease with stage 1 through stage 4 chronic kidney disease, or unspecified chronic kidney disease: Secondary | ICD-10-CM | POA: Diagnosis not present

## 2023-09-27 DIAGNOSIS — I480 Paroxysmal atrial fibrillation: Secondary | ICD-10-CM | POA: Diagnosis not present

## 2023-09-27 DIAGNOSIS — E1122 Type 2 diabetes mellitus with diabetic chronic kidney disease: Secondary | ICD-10-CM | POA: Diagnosis not present

## 2023-09-27 DIAGNOSIS — F03B3 Unspecified dementia, moderate, with mood disturbance: Secondary | ICD-10-CM | POA: Diagnosis not present

## 2023-09-28 NOTE — Telephone Encounter (Signed)
 Orders for home health sent to [facility] home health

## 2023-10-02 DIAGNOSIS — I5042 Chronic combined systolic (congestive) and diastolic (congestive) heart failure: Secondary | ICD-10-CM | POA: Diagnosis not present

## 2023-10-02 DIAGNOSIS — Z8546 Personal history of malignant neoplasm of prostate: Secondary | ICD-10-CM | POA: Diagnosis not present

## 2023-10-02 DIAGNOSIS — I480 Paroxysmal atrial fibrillation: Secondary | ICD-10-CM | POA: Diagnosis not present

## 2023-10-02 DIAGNOSIS — I701 Atherosclerosis of renal artery: Secondary | ICD-10-CM | POA: Diagnosis not present

## 2023-10-02 DIAGNOSIS — I5022 Chronic systolic (congestive) heart failure: Secondary | ICD-10-CM | POA: Diagnosis not present

## 2023-10-02 DIAGNOSIS — I10 Essential (primary) hypertension: Secondary | ICD-10-CM | POA: Diagnosis not present

## 2023-10-03 DIAGNOSIS — F02A0 Dementia in other diseases classified elsewhere, mild, without behavioral disturbance, psychotic disturbance, mood disturbance, and anxiety: Secondary | ICD-10-CM | POA: Diagnosis not present

## 2023-10-05 DIAGNOSIS — F32A Depression, unspecified: Secondary | ICD-10-CM | POA: Diagnosis not present

## 2023-10-05 DIAGNOSIS — N189 Chronic kidney disease, unspecified: Secondary | ICD-10-CM | POA: Diagnosis not present

## 2023-10-05 DIAGNOSIS — I129 Hypertensive chronic kidney disease with stage 1 through stage 4 chronic kidney disease, or unspecified chronic kidney disease: Secondary | ICD-10-CM | POA: Diagnosis not present

## 2023-10-05 DIAGNOSIS — E1122 Type 2 diabetes mellitus with diabetic chronic kidney disease: Secondary | ICD-10-CM | POA: Diagnosis not present

## 2023-10-05 DIAGNOSIS — I480 Paroxysmal atrial fibrillation: Secondary | ICD-10-CM | POA: Diagnosis not present

## 2023-10-05 DIAGNOSIS — F03B3 Unspecified dementia, moderate, with mood disturbance: Secondary | ICD-10-CM | POA: Diagnosis not present

## 2023-10-10 DIAGNOSIS — F03B3 Unspecified dementia, moderate, with mood disturbance: Secondary | ICD-10-CM | POA: Diagnosis not present

## 2023-10-10 DIAGNOSIS — I480 Paroxysmal atrial fibrillation: Secondary | ICD-10-CM | POA: Diagnosis not present

## 2023-10-10 DIAGNOSIS — N189 Chronic kidney disease, unspecified: Secondary | ICD-10-CM | POA: Diagnosis not present

## 2023-10-10 DIAGNOSIS — F32A Depression, unspecified: Secondary | ICD-10-CM | POA: Diagnosis not present

## 2023-10-10 DIAGNOSIS — E1122 Type 2 diabetes mellitus with diabetic chronic kidney disease: Secondary | ICD-10-CM | POA: Diagnosis not present

## 2023-10-10 DIAGNOSIS — I129 Hypertensive chronic kidney disease with stage 1 through stage 4 chronic kidney disease, or unspecified chronic kidney disease: Secondary | ICD-10-CM | POA: Diagnosis not present

## 2023-10-20 DIAGNOSIS — I10 Essential (primary) hypertension: Secondary | ICD-10-CM | POA: Diagnosis not present

## 2023-10-20 DIAGNOSIS — I701 Atherosclerosis of renal artery: Secondary | ICD-10-CM | POA: Diagnosis not present

## 2023-10-20 DIAGNOSIS — I5022 Chronic systolic (congestive) heart failure: Secondary | ICD-10-CM | POA: Diagnosis not present

## 2023-10-20 DIAGNOSIS — I5042 Chronic combined systolic (congestive) and diastolic (congestive) heart failure: Secondary | ICD-10-CM | POA: Diagnosis not present

## 2023-11-02 DIAGNOSIS — I480 Paroxysmal atrial fibrillation: Secondary | ICD-10-CM | POA: Diagnosis not present

## 2023-11-02 DIAGNOSIS — I701 Atherosclerosis of renal artery: Secondary | ICD-10-CM | POA: Diagnosis not present

## 2023-11-02 DIAGNOSIS — I5022 Chronic systolic (congestive) heart failure: Secondary | ICD-10-CM | POA: Diagnosis not present

## 2023-11-02 DIAGNOSIS — Z8546 Personal history of malignant neoplasm of prostate: Secondary | ICD-10-CM | POA: Diagnosis not present

## 2023-11-02 DIAGNOSIS — I5042 Chronic combined systolic (congestive) and diastolic (congestive) heart failure: Secondary | ICD-10-CM | POA: Diagnosis not present

## 2023-11-02 DIAGNOSIS — I10 Essential (primary) hypertension: Secondary | ICD-10-CM | POA: Diagnosis not present

## 2023-11-03 DIAGNOSIS — F02A0 Dementia in other diseases classified elsewhere, mild, without behavioral disturbance, psychotic disturbance, mood disturbance, and anxiety: Secondary | ICD-10-CM | POA: Diagnosis not present

## 2023-11-19 DIAGNOSIS — I10 Essential (primary) hypertension: Secondary | ICD-10-CM | POA: Diagnosis not present

## 2023-11-19 DIAGNOSIS — I5042 Chronic combined systolic (congestive) and diastolic (congestive) heart failure: Secondary | ICD-10-CM | POA: Diagnosis not present

## 2023-11-19 DIAGNOSIS — I701 Atherosclerosis of renal artery: Secondary | ICD-10-CM | POA: Diagnosis not present

## 2023-11-19 DIAGNOSIS — I5022 Chronic systolic (congestive) heart failure: Secondary | ICD-10-CM | POA: Diagnosis not present

## 2023-12-03 DIAGNOSIS — I5042 Chronic combined systolic (congestive) and diastolic (congestive) heart failure: Secondary | ICD-10-CM | POA: Diagnosis not present

## 2023-12-03 DIAGNOSIS — Z8546 Personal history of malignant neoplasm of prostate: Secondary | ICD-10-CM | POA: Diagnosis not present

## 2023-12-03 DIAGNOSIS — I10 Essential (primary) hypertension: Secondary | ICD-10-CM | POA: Diagnosis not present

## 2023-12-03 DIAGNOSIS — I701 Atherosclerosis of renal artery: Secondary | ICD-10-CM | POA: Diagnosis not present

## 2023-12-03 DIAGNOSIS — I5022 Chronic systolic (congestive) heart failure: Secondary | ICD-10-CM | POA: Diagnosis not present

## 2023-12-03 DIAGNOSIS — I480 Paroxysmal atrial fibrillation: Secondary | ICD-10-CM | POA: Diagnosis not present

## 2023-12-04 DIAGNOSIS — F02A0 Dementia in other diseases classified elsewhere, mild, without behavioral disturbance, psychotic disturbance, mood disturbance, and anxiety: Secondary | ICD-10-CM | POA: Diagnosis not present

## 2023-12-19 DIAGNOSIS — I5022 Chronic systolic (congestive) heart failure: Secondary | ICD-10-CM | POA: Diagnosis not present

## 2023-12-19 DIAGNOSIS — I10 Essential (primary) hypertension: Secondary | ICD-10-CM | POA: Diagnosis not present

## 2023-12-19 DIAGNOSIS — Z23 Encounter for immunization: Secondary | ICD-10-CM | POA: Diagnosis not present

## 2023-12-19 DIAGNOSIS — I701 Atherosclerosis of renal artery: Secondary | ICD-10-CM | POA: Diagnosis not present

## 2023-12-19 DIAGNOSIS — I5042 Chronic combined systolic (congestive) and diastolic (congestive) heart failure: Secondary | ICD-10-CM | POA: Diagnosis not present

## 2024-01-02 ENCOUNTER — Ambulatory Visit: Admitting: Neurology

## 2024-01-02 DIAGNOSIS — I480 Paroxysmal atrial fibrillation: Secondary | ICD-10-CM | POA: Diagnosis not present

## 2024-01-02 DIAGNOSIS — I701 Atherosclerosis of renal artery: Secondary | ICD-10-CM | POA: Diagnosis not present

## 2024-01-02 DIAGNOSIS — I5022 Chronic systolic (congestive) heart failure: Secondary | ICD-10-CM | POA: Diagnosis not present

## 2024-01-02 DIAGNOSIS — Z8546 Personal history of malignant neoplasm of prostate: Secondary | ICD-10-CM | POA: Diagnosis not present

## 2024-01-02 DIAGNOSIS — I5042 Chronic combined systolic (congestive) and diastolic (congestive) heart failure: Secondary | ICD-10-CM | POA: Diagnosis not present

## 2024-01-02 DIAGNOSIS — I10 Essential (primary) hypertension: Secondary | ICD-10-CM | POA: Diagnosis not present

## 2024-01-03 DIAGNOSIS — F02A0 Dementia in other diseases classified elsewhere, mild, without behavioral disturbance, psychotic disturbance, mood disturbance, and anxiety: Secondary | ICD-10-CM | POA: Diagnosis not present

## 2024-01-18 DIAGNOSIS — I10 Essential (primary) hypertension: Secondary | ICD-10-CM | POA: Diagnosis not present

## 2024-01-18 DIAGNOSIS — I701 Atherosclerosis of renal artery: Secondary | ICD-10-CM | POA: Diagnosis not present

## 2024-01-18 DIAGNOSIS — I5042 Chronic combined systolic (congestive) and diastolic (congestive) heart failure: Secondary | ICD-10-CM | POA: Diagnosis not present

## 2024-01-18 DIAGNOSIS — I5022 Chronic systolic (congestive) heart failure: Secondary | ICD-10-CM | POA: Diagnosis not present

## 2024-02-02 DIAGNOSIS — I5042 Chronic combined systolic (congestive) and diastolic (congestive) heart failure: Secondary | ICD-10-CM | POA: Diagnosis not present

## 2024-02-02 DIAGNOSIS — I10 Essential (primary) hypertension: Secondary | ICD-10-CM | POA: Diagnosis not present

## 2024-02-02 DIAGNOSIS — I701 Atherosclerosis of renal artery: Secondary | ICD-10-CM | POA: Diagnosis not present

## 2024-02-02 DIAGNOSIS — I5022 Chronic systolic (congestive) heart failure: Secondary | ICD-10-CM | POA: Diagnosis not present

## 2024-02-02 DIAGNOSIS — I480 Paroxysmal atrial fibrillation: Secondary | ICD-10-CM | POA: Diagnosis not present

## 2024-02-02 DIAGNOSIS — Z8546 Personal history of malignant neoplasm of prostate: Secondary | ICD-10-CM | POA: Diagnosis not present

## 2024-02-17 DIAGNOSIS — I5042 Chronic combined systolic (congestive) and diastolic (congestive) heart failure: Secondary | ICD-10-CM | POA: Diagnosis not present

## 2024-02-17 DIAGNOSIS — I701 Atherosclerosis of renal artery: Secondary | ICD-10-CM | POA: Diagnosis not present

## 2024-02-17 DIAGNOSIS — I10 Essential (primary) hypertension: Secondary | ICD-10-CM | POA: Diagnosis not present

## 2024-02-17 DIAGNOSIS — I5022 Chronic systolic (congestive) heart failure: Secondary | ICD-10-CM | POA: Diagnosis not present

## 2024-03-03 DIAGNOSIS — I5042 Chronic combined systolic (congestive) and diastolic (congestive) heart failure: Secondary | ICD-10-CM | POA: Diagnosis not present

## 2024-03-03 DIAGNOSIS — I5022 Chronic systolic (congestive) heart failure: Secondary | ICD-10-CM | POA: Diagnosis not present

## 2024-03-03 DIAGNOSIS — Z8546 Personal history of malignant neoplasm of prostate: Secondary | ICD-10-CM | POA: Diagnosis not present

## 2024-03-03 DIAGNOSIS — I10 Essential (primary) hypertension: Secondary | ICD-10-CM | POA: Diagnosis not present

## 2024-03-03 DIAGNOSIS — I701 Atherosclerosis of renal artery: Secondary | ICD-10-CM | POA: Diagnosis not present

## 2024-03-03 DIAGNOSIS — I480 Paroxysmal atrial fibrillation: Secondary | ICD-10-CM | POA: Diagnosis not present

## 2024-03-07 DIAGNOSIS — I5042 Chronic combined systolic (congestive) and diastolic (congestive) heart failure: Secondary | ICD-10-CM | POA: Diagnosis not present

## 2024-03-07 DIAGNOSIS — F0150 Vascular dementia without behavioral disturbance: Secondary | ICD-10-CM | POA: Diagnosis not present

## 2024-03-07 DIAGNOSIS — N1832 Chronic kidney disease, stage 3b: Secondary | ICD-10-CM | POA: Diagnosis not present

## 2024-03-07 DIAGNOSIS — E1122 Type 2 diabetes mellitus with diabetic chronic kidney disease: Secondary | ICD-10-CM | POA: Diagnosis not present

## 2024-03-07 DIAGNOSIS — K429 Umbilical hernia without obstruction or gangrene: Secondary | ICD-10-CM | POA: Diagnosis not present

## 2024-03-07 DIAGNOSIS — I10 Essential (primary) hypertension: Secondary | ICD-10-CM | POA: Diagnosis not present

## 2024-03-07 DIAGNOSIS — I480 Paroxysmal atrial fibrillation: Secondary | ICD-10-CM | POA: Diagnosis not present

## 2024-03-07 DIAGNOSIS — Z9181 History of falling: Secondary | ICD-10-CM | POA: Diagnosis not present

## 2024-03-19 DIAGNOSIS — I129 Hypertensive chronic kidney disease with stage 1 through stage 4 chronic kidney disease, or unspecified chronic kidney disease: Secondary | ICD-10-CM | POA: Diagnosis not present

## 2024-03-19 DIAGNOSIS — I5022 Chronic systolic (congestive) heart failure: Secondary | ICD-10-CM | POA: Diagnosis not present

## 2024-03-19 DIAGNOSIS — N281 Cyst of kidney, acquired: Secondary | ICD-10-CM | POA: Diagnosis not present

## 2024-03-19 DIAGNOSIS — E785 Hyperlipidemia, unspecified: Secondary | ICD-10-CM | POA: Diagnosis not present

## 2024-03-19 DIAGNOSIS — D649 Anemia, unspecified: Secondary | ICD-10-CM | POA: Diagnosis not present

## 2024-03-19 DIAGNOSIS — N184 Chronic kidney disease, stage 4 (severe): Secondary | ICD-10-CM | POA: Diagnosis not present

## 2024-03-19 DIAGNOSIS — E1122 Type 2 diabetes mellitus with diabetic chronic kidney disease: Secondary | ICD-10-CM | POA: Diagnosis not present

## 2024-05-04 ENCOUNTER — Encounter (HOSPITAL_COMMUNITY): Payer: Self-pay

## 2024-05-04 ENCOUNTER — Other Ambulatory Visit: Payer: Self-pay

## 2024-05-04 ENCOUNTER — Emergency Department (HOSPITAL_COMMUNITY)
Admission: EM | Admit: 2024-05-04 | Discharge: 2024-05-04 | Disposition: A | Discharge status: Home / Self Care | Attending: Emergency Medicine | Admitting: Emergency Medicine

## 2024-05-04 DIAGNOSIS — R55 Syncope and collapse: Secondary | ICD-10-CM | POA: Insufficient documentation

## 2024-05-04 DIAGNOSIS — Z7901 Long term (current) use of anticoagulants: Secondary | ICD-10-CM | POA: Insufficient documentation

## 2024-05-04 LAB — COMPREHENSIVE METABOLIC PANEL WITH GFR
ALT: 12 U/L (ref 0–44)
AST: 19 U/L (ref 15–41)
Albumin: 3.4 g/dL — ABNORMAL LOW (ref 3.5–5.0)
Alkaline Phosphatase: 59 U/L (ref 38–126)
Anion gap: 11 (ref 5–15)
BUN: 37 mg/dL — ABNORMAL HIGH (ref 8–23)
CO2: 23 mmol/L (ref 22–32)
Calcium: 8.4 mg/dL — ABNORMAL LOW (ref 8.9–10.3)
Chloride: 104 mmol/L (ref 98–111)
Creatinine, Ser: 2.43 mg/dL — ABNORMAL HIGH (ref 0.61–1.24)
GFR, Estimated: 25 mL/min — ABNORMAL LOW
Glucose, Bld: 153 mg/dL — ABNORMAL HIGH (ref 70–99)
Potassium: 4.3 mmol/L (ref 3.5–5.1)
Sodium: 138 mmol/L (ref 135–145)
Total Bilirubin: 0.4 mg/dL (ref 0.0–1.2)
Total Protein: 5.9 g/dL — ABNORMAL LOW (ref 6.5–8.1)

## 2024-05-04 LAB — CBC WITH DIFFERENTIAL/PLATELET
Abs Immature Granulocytes: 0.03 10*3/uL (ref 0.00–0.07)
Basophils Absolute: 0.1 10*3/uL (ref 0.0–0.1)
Basophils Relative: 1 %
Eosinophils Absolute: 0.2 10*3/uL (ref 0.0–0.5)
Eosinophils Relative: 2 %
HCT: 41.5 % (ref 39.0–52.0)
Hemoglobin: 13.9 g/dL (ref 13.0–17.0)
Immature Granulocytes: 0 %
Lymphocytes Relative: 10 %
Lymphs Abs: 0.9 10*3/uL (ref 0.7–4.0)
MCH: 29.3 pg (ref 26.0–34.0)
MCHC: 33.5 g/dL (ref 30.0–36.0)
MCV: 87.4 fL (ref 80.0–100.0)
Monocytes Absolute: 0.7 10*3/uL (ref 0.1–1.0)
Monocytes Relative: 8 %
Neutro Abs: 6.8 10*3/uL (ref 1.7–7.7)
Neutrophils Relative %: 79 %
Platelets: 198 10*3/uL (ref 150–400)
RBC: 4.75 MIL/uL (ref 4.22–5.81)
RDW: 13.3 % (ref 11.5–15.5)
WBC: 8.6 10*3/uL (ref 4.0–10.5)
nRBC: 0 % (ref 0.0–0.2)

## 2024-05-04 LAB — MAGNESIUM: Magnesium: 1.8 mg/dL (ref 1.7–2.4)

## 2024-05-04 LAB — CBG MONITORING, ED: Glucose-Capillary: 147 mg/dL — ABNORMAL HIGH (ref 70–99)

## 2024-05-04 MED ORDER — SODIUM CHLORIDE 0.9 % IV BOLUS
1000.0000 mL | Freq: Once | INTRAVENOUS | Status: AC
  Administered 2024-05-04: 1000 mL via INTRAVENOUS

## 2024-05-04 NOTE — ED Triage Notes (Signed)
 Pt BIB [facility] from home d/y multiple syncopal episode. No falls. Positive orthostatic. 70/40. 400 ml fluids NS given.   127/62 Hr 60  100%cbg 90

## 2024-05-04 NOTE — ED Provider Notes (Signed)
 Patient was discharged by verbal order by prior provider Dr Emil and taken home from ED.  I was requested to click discharge to clear patient off the board - this patient was not evaluated by myself.   Cottie Donnice PARAS, MD 05/04/24 [phone]

## 2024-05-04 NOTE — ED Provider Notes (Signed)
 " [place] EM[facility] AT [facility] Provider Note   CSN: [id number] Arrival date & time: 05/04/24  1235     Patient presents with: Loss of Consciousness   [name] is a 89 y.o. male.   89 yo M with a chief complaint of a syncopal event.  Patient has trouble remembering what was going on at the time.  He is not able to provide much further history.  It sounds like per EMS that he had to go to the bathroom and had collapsed.  Reportedly tried to get up and had lost consciousness multiple times.  Ended up having an episode of emesis.  Thought to be orthostatic with EMS.  Given IV fluids with some improvement.  Patient feels fine now.  Tells me he has maybe felt a little bit dizzy all day today.  Denies headache denies neck pain denies chest pain denies abdominal pain.  Denies injury with this event.    Loss of Consciousness      Prior to Admission medications  Medication Sig Start Date End Date Taking? Authorizing Provider  amLODipine  (NORVASC ) 5 MG tablet Take 5 mg by mouth daily. 08/04/22   [provider]  apixaban  (ELIQUIS ) 2.5 MG TABS tablet Take 1 tablet (2.5 mg total) by mouth 2 (two) times daily. 08/05/20   Zhao, Xika, NP  Cholecalciferol (VITAMIN D3) 50 MCG (2000 UT) CAPS Take 2,000 Units by mouth daily.    [provider]  Cyanocobalamin  (VITAMIN B 12 PO) Take 1 tablet by mouth daily.    [provider]  escitalopram  (LEXAPRO ) 20 MG tablet Take 20 mg by mouth daily. 09/29/20   [provider]  Ferrous Sulfate Dried (FERROUS SULFATE CR PO) Take 1 tablet by mouth daily.    [provider]  glipiZIDE 2.5 MG TABS Take 1 tablet by mouth every morning. 08/04/22   [provider]  ketoconazole (NIZORAL) 2 % cream Apply 1 application. topically 2 (two) times daily. 06/03/21   [provider]  nitroGLYCERIN  (NITROSTAT ) 0.4 MG SL tablet Place 1 tablet (0.4 mg total) under the tongue every 5 (five) minutes  as needed for chest pain. 02/02/21 08/20/24  Anner Alm ORN, MD  olmesartan -hydrochlorothiazide (BENICAR  HCT) 40-12.5 MG tablet Take 0.5 tablets by mouth daily. 10/04/21   [provider]  ONETOUCH ULTRA test strip USE TO TEST BLOOD SUGAR ONCE OR TWICE A DAY (E11.22) 06/27/19   [provider]  oxybutynin  (DITROPAN  XL) 15 MG 24 hr tablet Take 1 tablet (15 mg total) by mouth at bedtime. 12/25/14   Angiulli, Toribio PARAS, PA-C  tamsulosin  (FLOMAX ) 0.4 MG CAPS capsule Take 1 capsule (0.4 mg total) by mouth at bedtime. 12/25/14   Angiulli, Toribio PARAS, PA-C    Allergies: Betadine [povidone iodine], Contrast media [iodinated contrast media], Iodine, Shellfish allergy, Spironolactone , and Gabapentin    Review of Systems  Cardiovascular:  Positive for syncope.    Updated Vital Signs BP (!) 157/88   Pulse 62   Temp 97.6 F (36.4 C) (Oral)   Resp 18   SpO2 100%   Physical Exam Vitals and nursing note reviewed.  Constitutional:      Appearance: He is well-developed.  HENT:     Head: Normocephalic and atraumatic.  Eyes:     Pupils: Pupils are equal, round, and reactive to light.  Neck:     Vascular: No JVD.  Cardiovascular:     Rate and Rhythm: Normal rate and regular rhythm.  Heart sounds: No murmur heard.    No friction rub. No gallop.  Pulmonary:     Effort: No respiratory distress.     Breath sounds: No wheezing.  Abdominal:     General: There is no distension.     Tenderness: There is no abdominal tenderness. There is no guarding or rebound.  Musculoskeletal:        General: Normal range of motion.     Cervical back: Normal range of motion and neck supple.  Skin:    Coloration: Skin is not pale.     Findings: No rash.  Neurological:     Mental Status: He is alert and oriented to person, place, and time.  Psychiatric:        Behavior: Behavior normal.     (all labs ordered are listed, but only abnormal results are displayed) Labs Reviewed  COMPREHENSIVE  METABOLIC PANEL WITH GFR - Abnormal; Notable for the following components:      Result Value   Glucose, Bld 153 (*)    BUN 37 (*)    Creatinine, Ser 2.43 (*)    Calcium  8.4 (*)    Total Protein 5.9 (*)    Albumin 3.4 (*)    GFR, Estimated 25 (*)    All other components within normal limits  CBG MONITORING, ED - Abnormal; Notable for the following components:   Glucose-Capillary 147 (*)    All other components within normal limits  CBC WITH DIFFERENTIAL/PLATELET  MAGNESIUM    EKG: EKG Interpretation Date/Time:  Saturday May 04 2024 12:42:34 EST Ventricular Rate:  82 PR Interval:    QRS Duration:  106 QT Interval:  437 QTC Calculation: 475 R Axis:   -66  Text Interpretation: Atrial flutter Incomplete RBBB and LAFB Probable anterior infarct, age indeterminate Lateral leads are also involved No significant change since last tracing Confirmed by Emil Share [phone]) on 05/04/2024 2:00:07 PM  Radiology: No results found.   Procedures   Medications Ordered in the ED  sodium chloride  0.9 % bolus 1,000 mL (1,000 mLs Intravenous New Bag/Given 05/04/24 1252)                                    Medical Decision Making Amount and/or Complexity of Data Reviewed Labs: ordered.   89 yo M with a cc of an event where he lost consciousness.  Likely vagal in etiology.  Patient feeling better after fluids.  No acute anemia.  No electrolyte abnormalities.    Ambulates here without obvious issue.    Unfortunately wife was transported here with him.  She has a walker.  They dont think they are able to get into the building.    Will work with nursing to try and establish a safe trip home.   The patients results and plan were reviewed and discussed.   Any x-rays performed were independently reviewed by myself.   Differential diagnosis were considered with the presenting HPI.  Medications  sodium chloride  0.9 % bolus 1,000 mL (1,000 mLs Intravenous New Bag/Given 05/04/24 1252)     Vitals:   05/04/24 1244 05/04/24 1445  BP: (!) 140/77 (!) 157/88  Pulse: 78 62  Resp: 17 18  Temp: 97.6 F (36.4 C)   TempSrc: Oral   SpO2: 100% 100%    Final diagnoses:  Syncope and collapse    Admission/ observation were discussed with the admitting physician, patient and/or family and  they are comfortable with the plan.       Final diagnoses:  Syncope and collapse    ED Discharge Orders     None          Emil Share, DO 05/04/24 1516  "
